# Patient Record
Sex: Female | Born: 1935 | Race: White | Hispanic: No | Marital: Married | Smoking: Never smoker
Health system: Southern US, Community
[De-identification: ages and names within clinical notes are randomized; demographics above are authoritative.]

## PROBLEM LIST (undated history)

## (undated) DIAGNOSIS — R600 Localized edema: Secondary | ICD-10-CM

## (undated) DIAGNOSIS — R0600 Dyspnea, unspecified: Secondary | ICD-10-CM

## (undated) DIAGNOSIS — Z9989 Dependence on other enabling machines and devices: Secondary | ICD-10-CM

## (undated) DIAGNOSIS — M810 Age-related osteoporosis without current pathological fracture: Secondary | ICD-10-CM

## (undated) DIAGNOSIS — I498 Other specified cardiac arrhythmias: Secondary | ICD-10-CM

## (undated) DIAGNOSIS — N183 Chronic kidney disease, stage 3 unspecified: Secondary | ICD-10-CM

## (undated) DIAGNOSIS — E119 Type 2 diabetes mellitus without complications: Secondary | ICD-10-CM

## (undated) DIAGNOSIS — Z950 Presence of cardiac pacemaker: Secondary | ICD-10-CM

## (undated) DIAGNOSIS — Z87440 Personal history of urinary (tract) infections: Secondary | ICD-10-CM

## (undated) DIAGNOSIS — I4819 Other persistent atrial fibrillation: Secondary | ICD-10-CM

## (undated) DIAGNOSIS — Z972 Presence of dental prosthetic device (complete) (partial): Secondary | ICD-10-CM

## (undated) DIAGNOSIS — M75100 Unspecified rotator cuff tear or rupture of unspecified shoulder, not specified as traumatic: Secondary | ICD-10-CM

## (undated) DIAGNOSIS — E785 Hyperlipidemia, unspecified: Secondary | ICD-10-CM

## (undated) DIAGNOSIS — D631 Anemia in chronic kidney disease: Secondary | ICD-10-CM

## (undated) DIAGNOSIS — I5032 Chronic diastolic (congestive) heart failure: Secondary | ICD-10-CM

## (undated) DIAGNOSIS — M1990 Unspecified osteoarthritis, unspecified site: Secondary | ICD-10-CM

## (undated) DIAGNOSIS — F419 Anxiety disorder, unspecified: Secondary | ICD-10-CM

## (undated) DIAGNOSIS — H9191 Unspecified hearing loss, right ear: Secondary | ICD-10-CM

## (undated) DIAGNOSIS — E039 Hypothyroidism, unspecified: Secondary | ICD-10-CM

## (undated) DIAGNOSIS — I10 Essential (primary) hypertension: Secondary | ICD-10-CM

## (undated) HISTORY — DX: Essential (primary) hypertension: I10

## (undated) HISTORY — PX: BACK SURGERY: SHX140

## (undated) HISTORY — DX: Hyperlipidemia, unspecified: E78.5

## (undated) HISTORY — DX: Localized edema: R60.0

## (undated) HISTORY — PX: TUBAL LIGATION: SHX77

## (undated) HISTORY — DX: Other persistent atrial fibrillation: I48.19

## (undated) HISTORY — DX: Chronic diastolic (congestive) heart failure: I50.32

## (undated) HISTORY — DX: Hypothyroidism, unspecified: E03.9

---
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
## 1998-03-15 ENCOUNTER — Ambulatory Visit (HOSPITAL_COMMUNITY): Admission: RE | Admit: 1998-03-15 | Discharge: 1998-03-15 | Payer: Self-pay | Admitting: Gastroenterology

## 1999-10-12 ENCOUNTER — Emergency Department (HOSPITAL_COMMUNITY): Admission: EM | Admit: 1999-10-12 | Discharge: 1999-10-12 | Payer: Self-pay | Admitting: Emergency Medicine

## 1999-10-20 ENCOUNTER — Encounter: Payer: Self-pay | Admitting: Orthopedic Surgery

## 1999-10-20 ENCOUNTER — Ambulatory Visit (HOSPITAL_COMMUNITY): Admission: RE | Admit: 1999-10-20 | Discharge: 1999-10-20 | Payer: Self-pay | Admitting: Orthopedic Surgery

## 1999-11-15 ENCOUNTER — Inpatient Hospital Stay (HOSPITAL_COMMUNITY): Admission: RE | Admit: 1999-11-15 | Discharge: 1999-11-16 | Payer: Self-pay | Admitting: Neurosurgery

## 2000-02-17 ENCOUNTER — Emergency Department (HOSPITAL_COMMUNITY): Admission: EM | Admit: 2000-02-17 | Discharge: 2000-02-18 | Payer: Self-pay | Admitting: Emergency Medicine

## 2000-02-18 ENCOUNTER — Encounter: Payer: Self-pay | Admitting: Emergency Medicine

## 2000-09-06 ENCOUNTER — Encounter: Payer: Self-pay | Admitting: Orthopedic Surgery

## 2000-09-06 ENCOUNTER — Encounter: Admission: RE | Admit: 2000-09-06 | Discharge: 2000-09-06 | Payer: Self-pay | Admitting: Orthopedic Surgery

## 2000-09-11 ENCOUNTER — Ambulatory Visit (HOSPITAL_COMMUNITY): Admission: RE | Admit: 2000-09-11 | Discharge: 2000-09-11 | Payer: Self-pay | Admitting: Neurosurgery

## 2000-10-09 ENCOUNTER — Inpatient Hospital Stay (HOSPITAL_COMMUNITY): Admission: RE | Admit: 2000-10-09 | Discharge: 2000-10-11 | Payer: Self-pay | Admitting: Neurosurgery

## 2001-03-16 ENCOUNTER — Ambulatory Visit (HOSPITAL_COMMUNITY): Admission: RE | Admit: 2001-03-16 | Discharge: 2001-03-16 | Payer: Self-pay | Admitting: Neurosurgery

## 2004-01-18 ENCOUNTER — Ambulatory Visit (HOSPITAL_COMMUNITY): Admission: RE | Admit: 2004-01-18 | Discharge: 2004-01-18 | Payer: Self-pay | Admitting: Neurosurgery

## 2004-11-23 ENCOUNTER — Inpatient Hospital Stay (HOSPITAL_COMMUNITY): Admission: RE | Admit: 2004-11-23 | Discharge: 2004-11-26 | Payer: Self-pay | Admitting: Neurosurgery

## 2006-01-25 ENCOUNTER — Encounter: Admission: RE | Admit: 2006-01-25 | Discharge: 2006-01-25 | Payer: Self-pay | Admitting: Orthopedic Surgery

## 2006-02-04 ENCOUNTER — Encounter: Admission: RE | Admit: 2006-02-04 | Discharge: 2006-02-04 | Payer: Self-pay | Admitting: Orthopedic Surgery

## 2006-05-10 ENCOUNTER — Ambulatory Visit (HOSPITAL_COMMUNITY): Admission: RE | Admit: 2006-05-10 | Discharge: 2006-05-10 | Payer: Self-pay | Admitting: Orthopedic Surgery

## 2006-05-10 ENCOUNTER — Ambulatory Visit: Payer: Self-pay | Admitting: Vascular Surgery

## 2006-05-12 ENCOUNTER — Encounter: Admission: RE | Admit: 2006-05-12 | Discharge: 2006-05-12 | Payer: Self-pay | Admitting: Orthopedic Surgery

## 2008-02-11 ENCOUNTER — Emergency Department (HOSPITAL_COMMUNITY): Admission: EM | Admit: 2008-02-11 | Discharge: 2008-02-11 | Payer: Self-pay | Admitting: Emergency Medicine

## 2008-07-01 HISTORY — PX: REPLACEMENT TOTAL KNEE: SUR1224

## 2008-07-16 ENCOUNTER — Encounter: Admission: RE | Admit: 2008-07-16 | Discharge: 2008-07-16 | Payer: Self-pay | Admitting: Orthopedic Surgery

## 2008-07-19 ENCOUNTER — Inpatient Hospital Stay (HOSPITAL_COMMUNITY): Admission: RE | Admit: 2008-07-19 | Discharge: 2008-07-27 | Payer: Self-pay | Admitting: Orthopedic Surgery

## 2008-07-19 ENCOUNTER — Ambulatory Visit: Payer: Self-pay | Admitting: Cardiovascular Disease

## 2008-07-23 ENCOUNTER — Ambulatory Visit: Payer: Self-pay | Admitting: Infectious Diseases

## 2008-07-26 ENCOUNTER — Encounter (INDEPENDENT_AMBULATORY_CARE_PROVIDER_SITE_OTHER): Payer: Self-pay | Admitting: Internal Medicine

## 2008-07-27 ENCOUNTER — Encounter: Payer: Self-pay | Admitting: Cardiology

## 2008-08-18 ENCOUNTER — Encounter: Admission: RE | Admit: 2008-08-18 | Discharge: 2008-11-16 | Payer: Self-pay | Admitting: Orthopedic Surgery

## 2008-09-07 ENCOUNTER — Encounter: Admission: RE | Admit: 2008-09-07 | Discharge: 2008-09-07 | Payer: Self-pay | Admitting: Orthopedic Surgery

## 2008-09-07 ENCOUNTER — Telehealth (INDEPENDENT_AMBULATORY_CARE_PROVIDER_SITE_OTHER): Payer: Self-pay | Admitting: Radiology

## 2009-07-14 IMAGING — US US EXTREM LOW VENOUS*R*
1 series · 14 of 24 positions shown · non-contrast
Comparison: None

CLINICAL DATA: Recent right knee replacement surgery.  Right lower
extremity edema and pain.



[Series 1: us extrem low venous*right* · 14 of 30 slices shown]
[im 1/30]
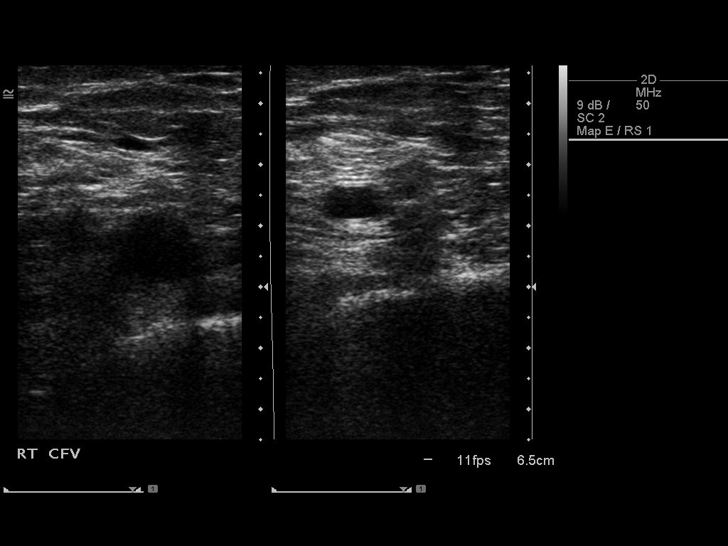
[im 3/30]
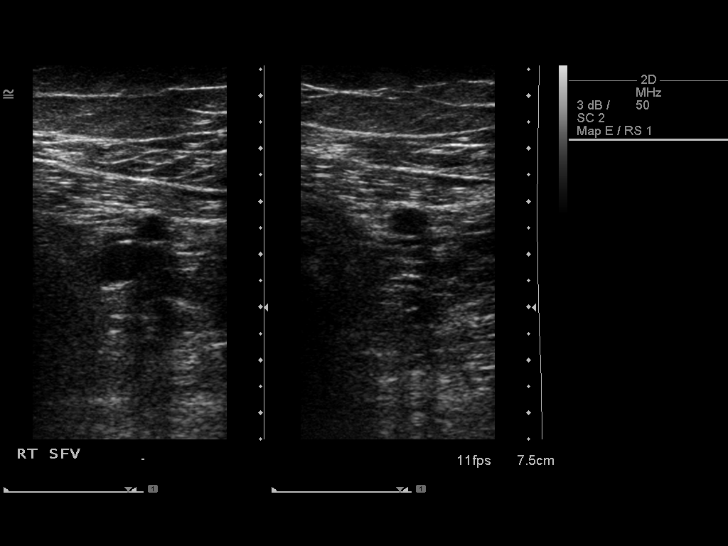
[im 6/30]
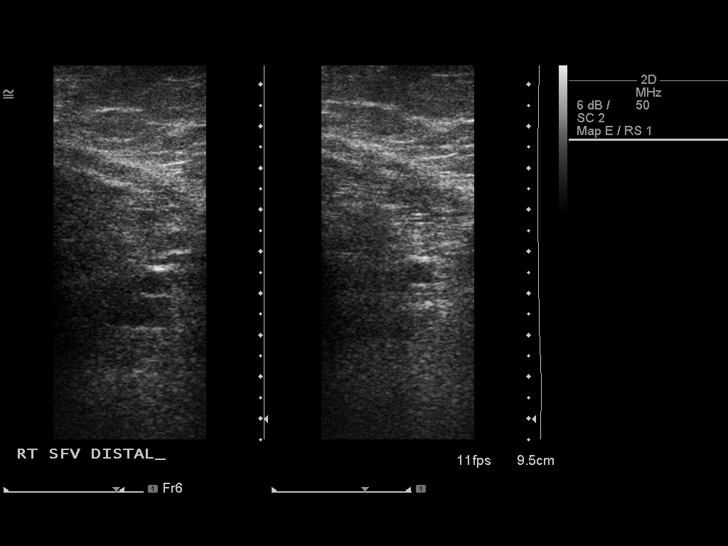
[im 8/30]
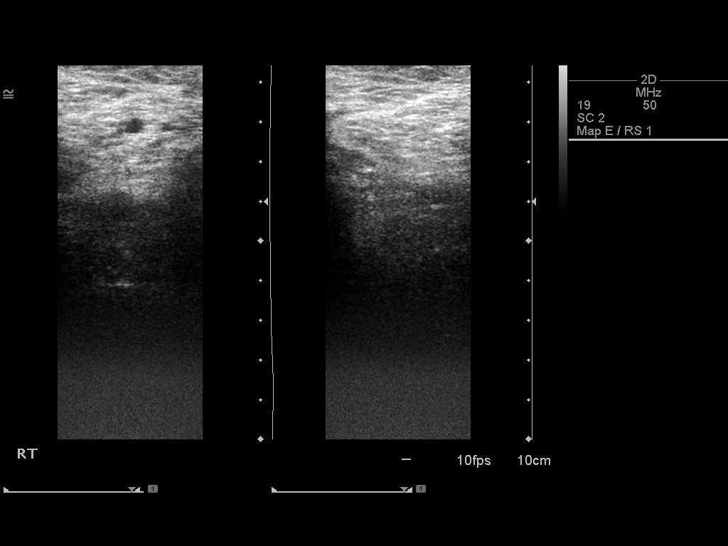
[im 9/30]
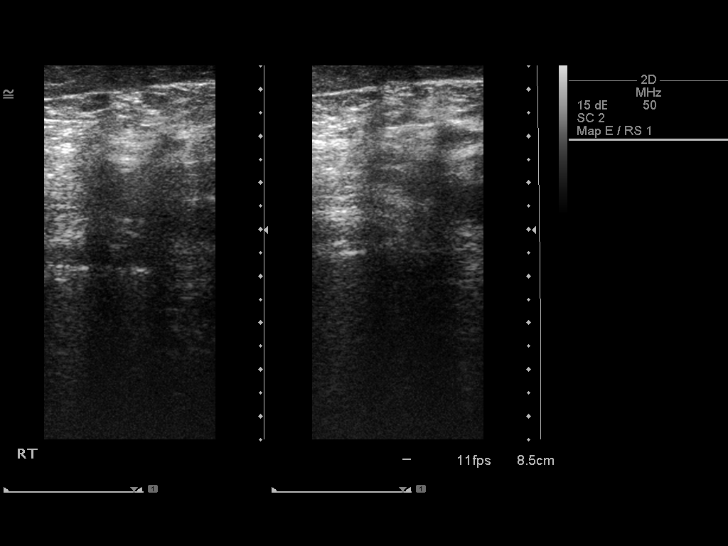
[im 12/30]
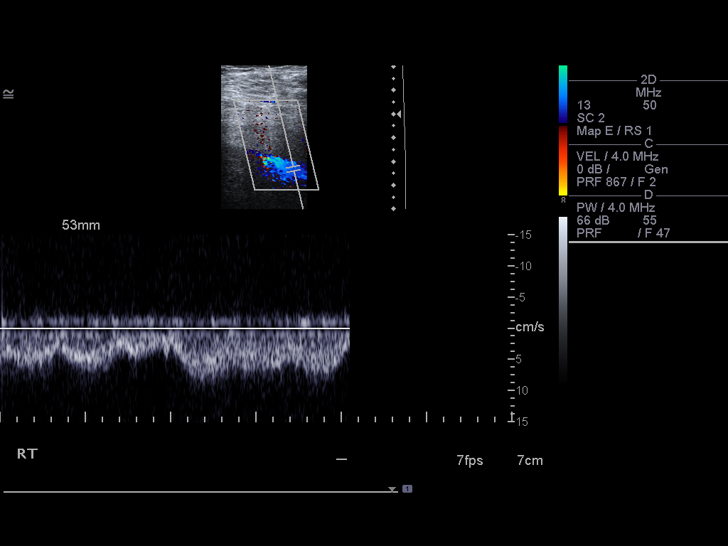
[im 14/30]
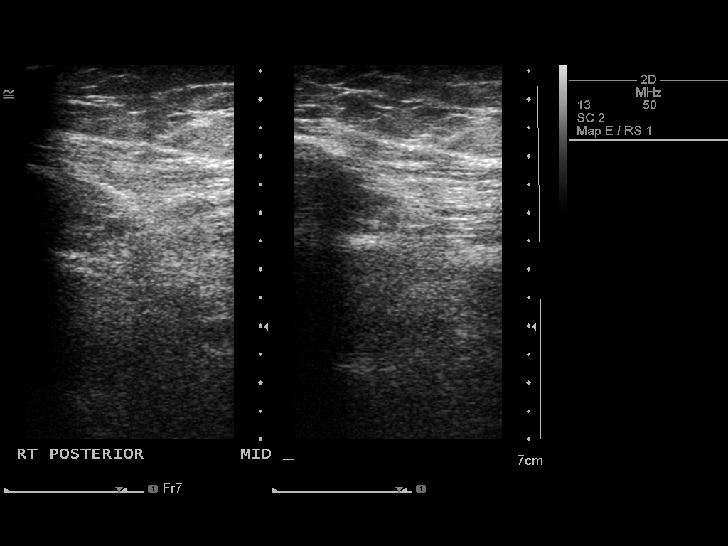
[im 16/30]
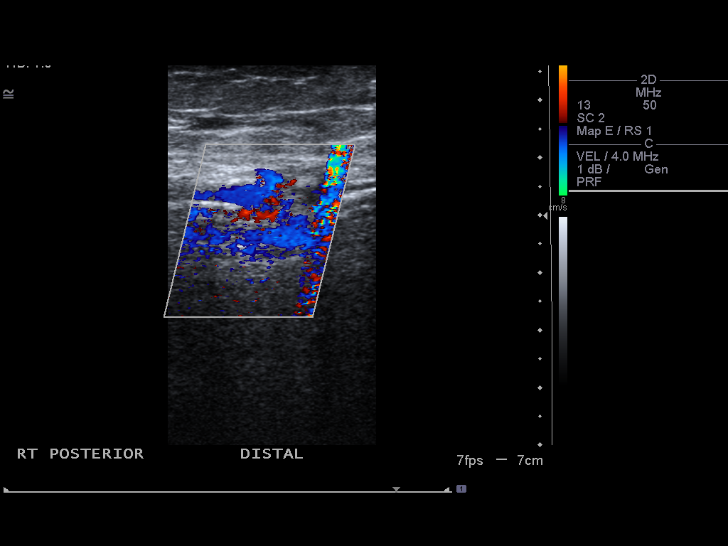
[im 18/30]
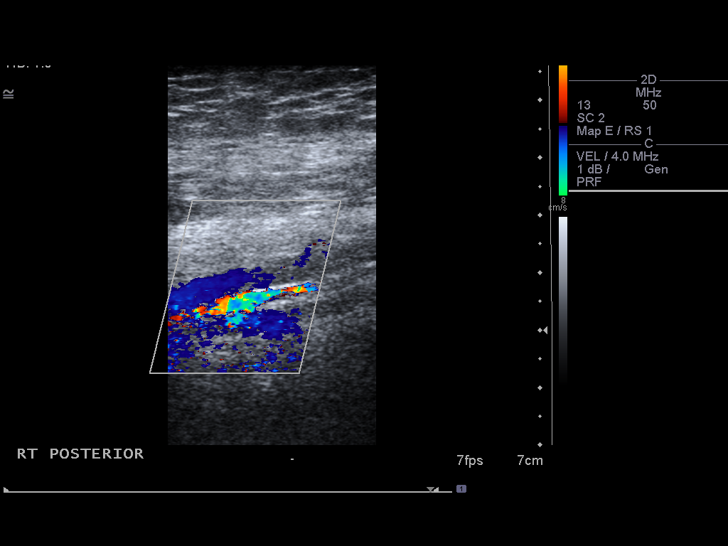
[im 21/30]
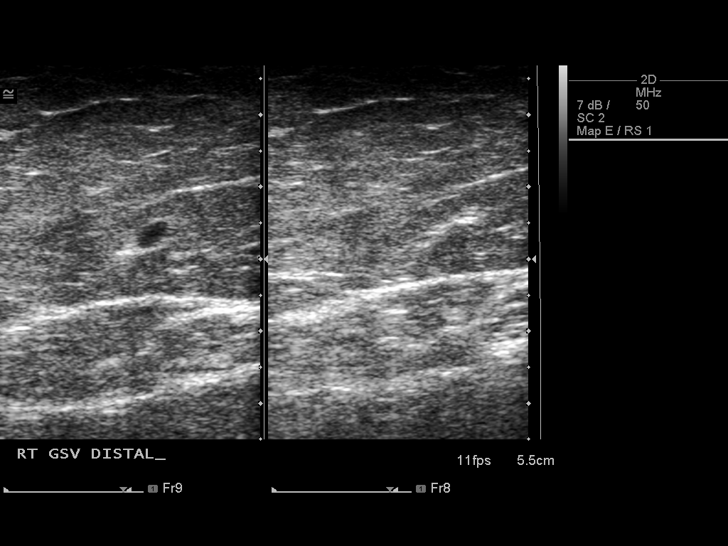
[im 23/30]
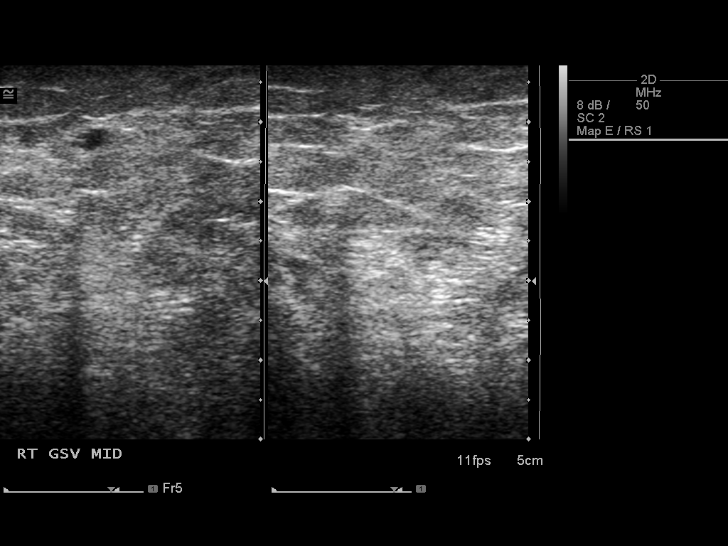
[im 24/30]
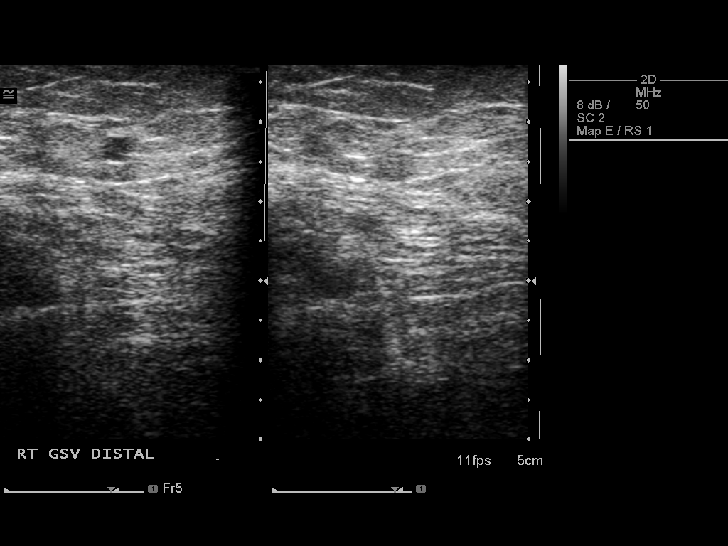
[im 27/30]
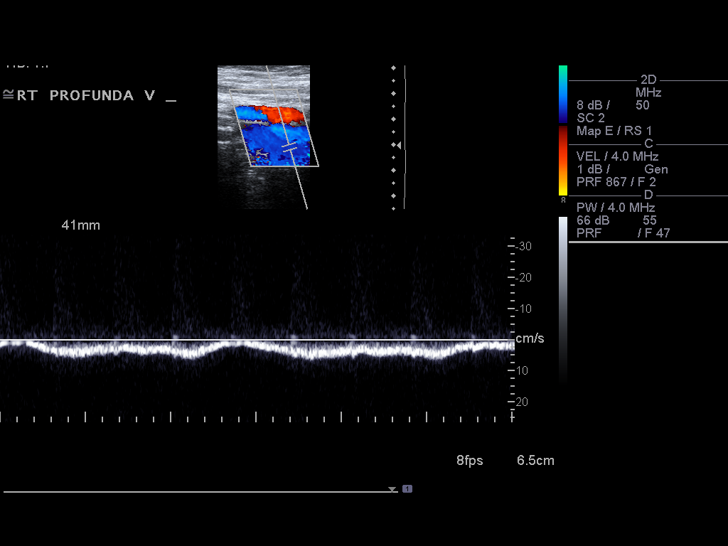
[im 30/30]
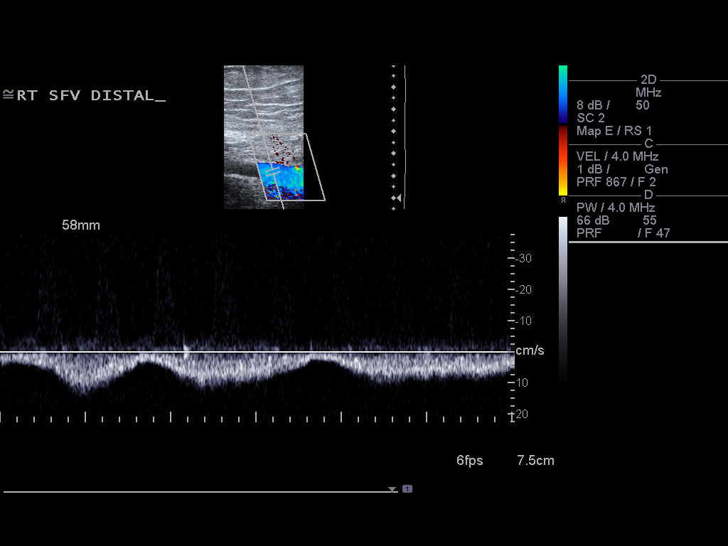

[14 of 24 positions shown; findings below may reference images not displayed]

FINDINGS: Focal nonocclusive thrombus present in the right
popliteal vein.  Other deep veins of the right lower extremity are
normally patent including the visualized tibial veins.  No evidence
of superficial thrombophlebitis.
IMPRESSION: Positive for DVT at the level of the right popliteal vein.  Other
deep veins of the right lower extremity are normally patent.

## 2009-08-16 ENCOUNTER — Encounter: Admission: RE | Admit: 2009-08-16 | Discharge: 2009-08-16 | Payer: Self-pay | Admitting: General Surgery

## 2009-08-18 ENCOUNTER — Ambulatory Visit (HOSPITAL_BASED_OUTPATIENT_CLINIC_OR_DEPARTMENT_OTHER): Admission: RE | Admit: 2009-08-18 | Discharge: 2009-08-18 | Payer: Self-pay | Admitting: General Surgery

## 2010-04-23 ENCOUNTER — Encounter: Payer: Self-pay | Admitting: Orthopedic Surgery

## 2010-05-02 NOTE — Progress Notes (Signed)
Summary: Nuc Pre-Procedure  Phone Note Outgoing Call Call back at Home Phone [phone]   Call placed by: Vedia Pereyra, CNMT at 2:30PM Summary of Call: Reviewed information on Myoview Information Sheet (see scanned document for further details).  Spoke with patient.       Nuclear Med Background Indications for Stress Test: Evaluation for Ischemia   History: Echo  History Comments: 07/19/08- Hosp. for TKR and 2 episodes of PAF noted Post-Op. 07/26/08- Echo- EF= 65-70%  Symptoms: DOE, Palpitations  Symptoms Comments: Chronic DOE   Nuclear Pre-Procedure Cardiac Risk Factors: Family History - CAD, Hypertension, Lipids, NIDDM  Appended Document: Nuc Pre-Procedure Patient no show for myoview 09/08/08,left message for patient to call us back to reschedule. The patient called back and cancelled due to diagnosed with a blood clot per Eliezer Lofts. Dr. Vonda Antigua nurse notified.

## 2010-06-19 LAB — DIFFERENTIAL
Basophils Absolute: 0 10*3/uL (ref 0.0–0.1)
Basophils Relative: 0 % (ref 0–1)
Eosinophils Absolute: 0.1 10*3/uL (ref 0.0–0.7)
Eosinophils Relative: 1 % (ref 0–5)
Lymphocytes Relative: 27 % (ref 12–46)
Lymphs Abs: 2.2 10*3/uL (ref 0.7–4.0)
Monocytes Absolute: 0.7 10*3/uL (ref 0.1–1.0)
Monocytes Relative: 9 % (ref 3–12)
Neutro Abs: 5.1 10*3/uL (ref 1.7–7.7)
Neutrophils Relative %: 63 % (ref 43–77)

## 2010-06-19 LAB — GLUCOSE, CAPILLARY
Glucose-Capillary: 168 mg/dL — ABNORMAL HIGH (ref 70–99)
Glucose-Capillary: 198 mg/dL — ABNORMAL HIGH (ref 70–99)

## 2010-06-19 LAB — BASIC METABOLIC PANEL
BUN: 18 mg/dL (ref 6–23)
CO2: 27 mEq/L (ref 19–32)
Calcium: 9.5 mg/dL (ref 8.4–10.5)
Chloride: 103 mEq/L (ref 96–112)
Creatinine, Ser: 0.63 mg/dL (ref 0.4–1.2)
GFR calc Af Amer: >60 mL/min (ref >60)
GFR calc non Af Amer: >60 mL/min (ref >60)
Glucose, Bld: 147 mg/dL — ABNORMAL HIGH (ref 70–99)
Potassium: 4.2 mEq/L (ref 3.5–5.1)
Sodium: 138 mEq/L (ref 135–145)

## 2010-06-19 LAB — CBC
HCT: 36.5 % (ref 36.0–46.0)
Hemoglobin: 12.1 g/dL (ref 12.0–15.0)
MCHC: 33.3 g/dL (ref 30.0–36.0)
MCV: 81.9 fL (ref 78.0–100.0)
Platelets: 194 10*3/uL (ref 150–400)
RBC: 4.46 MIL/uL (ref 3.87–5.11)
RDW: 15.9 % — ABNORMAL HIGH (ref 11.5–15.5)
WBC: 8.1 10*3/uL (ref 4.0–10.5)

## 2010-07-12 LAB — CARDIAC PANEL(CRET KIN+CKTOT+MB+TROPI)
CK, MB: 6.4 ng/mL — ABNORMAL HIGH (ref 0.3–4.0)
CK, MB: 7 ng/mL — ABNORMAL HIGH (ref 0.3–4.0)
CK, MB: 7.2 ng/mL — ABNORMAL HIGH (ref 0.3–4.0)
Relative Index: 1.6 (ref 0.0–2.5)
Relative Index: 2.1 (ref 0.0–2.5)
Relative Index: 2.1 (ref 0.0–2.5)
Total CK: 339 U/L — ABNORMAL HIGH (ref 7–177)
Total CK: 340 U/L — ABNORMAL HIGH (ref 7–177)
Total CK: 389 U/L — ABNORMAL HIGH (ref 7–177)
Troponin I: 0.01 ng/mL (ref 0.00–0.06)
Troponin I: 0.08 ng/mL — ABNORMAL HIGH (ref 0.00–0.06)
Troponin I: 0.09 ng/mL — ABNORMAL HIGH (ref 0.00–0.06)

## 2010-07-12 LAB — COMPREHENSIVE METABOLIC PANEL
ALT: 21 U/L (ref 0–35)
ALT: 29 U/L (ref 0–35)
AST: 31 U/L (ref 0–37)
AST: 40 U/L — ABNORMAL HIGH (ref 0–37)
Albumin: 2.5 g/dL — ABNORMAL LOW (ref 3.5–5.2)
Albumin: 4.1 g/dL (ref 3.5–5.2)
Alkaline Phosphatase: 41 U/L (ref 39–117)
Alkaline Phosphatase: 69 U/L (ref 39–117)
BUN: 10 mg/dL (ref 6–23)
BUN: 13 mg/dL (ref 6–23)
CO2: 25 mEq/L (ref 19–32)
CO2: 26 mEq/L (ref 19–32)
Calcium: 8.4 mg/dL (ref 8.4–10.5)
Calcium: 10.1 mg/dL (ref 8.4–10.5)
Chloride: 105 mEq/L (ref 96–112)
Chloride: 102 mEq/L (ref 96–112)
Creatinine, Ser: 0.63 mg/dL (ref 0.4–1.2)
Creatinine, Ser: 0.7 mg/dL (ref 0.4–1.2)
GFR calc Af Amer: >60 mL/min (ref >60)
GFR calc Af Amer: >60 mL/min (ref >60)
GFR calc non Af Amer: >60 mL/min (ref >60)
GFR calc non Af Amer: >60 mL/min (ref >60)
Glucose, Bld: 69 mg/dL — ABNORMAL LOW (ref 70–99)
Glucose, Bld: 93 mg/dL (ref 70–99)
Potassium: 3.7 mEq/L (ref 3.5–5.1)
Potassium: 4.5 mEq/L (ref 3.5–5.1)
Sodium: 138 mEq/L (ref 135–145)
Sodium: 139 mEq/L (ref 135–145)
Total Bilirubin: 0.4 mg/dL (ref 0.3–1.2)
Total Bilirubin: 0.6 mg/dL (ref 0.3–1.2)
Total Protein: 5.8 g/dL — ABNORMAL LOW (ref 6.0–8.3)
Total Protein: 7.6 g/dL (ref 6.0–8.3)

## 2010-07-12 LAB — GLUCOSE, CAPILLARY
Glucose-Capillary: 100 mg/dL — ABNORMAL HIGH (ref 70–99)
Glucose-Capillary: 107 mg/dL — ABNORMAL HIGH (ref 70–99)
Glucose-Capillary: 116 mg/dL — ABNORMAL HIGH (ref 70–99)
Glucose-Capillary: 116 mg/dL — ABNORMAL HIGH (ref 70–99)
Glucose-Capillary: 118 mg/dL — ABNORMAL HIGH (ref 70–99)
Glucose-Capillary: 121 mg/dL — ABNORMAL HIGH (ref 70–99)
Glucose-Capillary: 125 mg/dL — ABNORMAL HIGH (ref 70–99)
Glucose-Capillary: 125 mg/dL — ABNORMAL HIGH (ref 70–99)
Glucose-Capillary: 127 mg/dL — ABNORMAL HIGH (ref 70–99)
Glucose-Capillary: 128 mg/dL — ABNORMAL HIGH (ref 70–99)
Glucose-Capillary: 136 mg/dL — ABNORMAL HIGH (ref 70–99)
Glucose-Capillary: 137 mg/dL — ABNORMAL HIGH (ref 70–99)
Glucose-Capillary: 139 mg/dL — ABNORMAL HIGH (ref 70–99)
Glucose-Capillary: 143 mg/dL — ABNORMAL HIGH (ref 70–99)
Glucose-Capillary: 144 mg/dL — ABNORMAL HIGH (ref 70–99)
Glucose-Capillary: 145 mg/dL — ABNORMAL HIGH (ref 70–99)
Glucose-Capillary: 156 mg/dL — ABNORMAL HIGH (ref 70–99)
Glucose-Capillary: 156 mg/dL — ABNORMAL HIGH (ref 70–99)
Glucose-Capillary: 157 mg/dL — ABNORMAL HIGH (ref 70–99)
Glucose-Capillary: 160 mg/dL — ABNORMAL HIGH (ref 70–99)
Glucose-Capillary: 164 mg/dL — ABNORMAL HIGH (ref 70–99)
Glucose-Capillary: 166 mg/dL — ABNORMAL HIGH (ref 70–99)
Glucose-Capillary: 169 mg/dL — ABNORMAL HIGH (ref 70–99)
Glucose-Capillary: 169 mg/dL — ABNORMAL HIGH (ref 70–99)
Glucose-Capillary: 170 mg/dL — ABNORMAL HIGH (ref 70–99)
Glucose-Capillary: 172 mg/dL — ABNORMAL HIGH (ref 70–99)
Glucose-Capillary: 184 mg/dL — ABNORMAL HIGH (ref 70–99)
Glucose-Capillary: 187 mg/dL — ABNORMAL HIGH (ref 70–99)
Glucose-Capillary: 188 mg/dL — ABNORMAL HIGH (ref 70–99)
Glucose-Capillary: 203 mg/dL — ABNORMAL HIGH (ref 70–99)
Glucose-Capillary: 204 mg/dL — ABNORMAL HIGH (ref 70–99)
Glucose-Capillary: 77 mg/dL (ref 70–99)
Glucose-Capillary: 79 mg/dL (ref 70–99)
Glucose-Capillary: 83 mg/dL (ref 70–99)
Glucose-Capillary: 92 mg/dL (ref 70–99)
Glucose-Capillary: 93 mg/dL (ref 70–99)

## 2010-07-12 LAB — CBC
HCT: 25 % — ABNORMAL LOW (ref 36.0–46.0)
HCT: 29 % — ABNORMAL LOW (ref 36.0–46.0)
HCT: 29.4 % — ABNORMAL LOW (ref 36.0–46.0)
HCT: 30 % — ABNORMAL LOW (ref 36.0–46.0)
HCT: 30.8 % — ABNORMAL LOW (ref 36.0–46.0)
HCT: 32.5 % — ABNORMAL LOW (ref 36.0–46.0)
HCT: 33.1 % — ABNORMAL LOW (ref 36.0–46.0)
HCT: 33.6 % — ABNORMAL LOW (ref 36.0–46.0)
HCT: 34 % — ABNORMAL LOW (ref 36.0–46.0)
HCT: 36.8 % (ref 36.0–46.0)
Hemoglobin: 10.1 g/dL — ABNORMAL LOW (ref 12.0–15.0)
Hemoglobin: 10.2 g/dL — ABNORMAL LOW (ref 12.0–15.0)
Hemoglobin: 10.5 g/dL — ABNORMAL LOW (ref 12.0–15.0)
Hemoglobin: 10.8 g/dL — ABNORMAL LOW (ref 12.0–15.0)
Hemoglobin: 11.1 g/dL — ABNORMAL LOW (ref 12.0–15.0)
Hemoglobin: 11.2 g/dL — ABNORMAL LOW (ref 12.0–15.0)
Hemoglobin: 11.4 g/dL — ABNORMAL LOW (ref 12.0–15.0)
Hemoglobin: 8.5 g/dL — ABNORMAL LOW (ref 12.0–15.0)
Hemoglobin: 9.8 g/dL — ABNORMAL LOW (ref 12.0–15.0)
Hemoglobin: 12.3 g/dL (ref 12.0–15.0)
MCHC: 33.3 g/dL (ref 30.0–36.0)
MCHC: 33.4 g/dL (ref 30.0–36.0)
MCHC: 33.4 g/dL (ref 30.0–36.0)
MCHC: 33.7 g/dL (ref 30.0–36.0)
MCHC: 33.7 g/dL (ref 30.0–36.0)
MCHC: 33.8 g/dL (ref 30.0–36.0)
MCHC: 33.9 g/dL (ref 30.0–36.0)
MCHC: 34 g/dL (ref 30.0–36.0)
MCHC: 34.8 g/dL (ref 30.0–36.0)
MCHC: 33.4 g/dL (ref 30.0–36.0)
MCV: 79.9 fL (ref 78.0–100.0)
MCV: 80.3 fL (ref 78.0–100.0)
MCV: 80.3 fL (ref 78.0–100.0)
MCV: 81 fL (ref 78.0–100.0)
MCV: 81.2 fL (ref 78.0–100.0)
MCV: 81.2 fL (ref 78.0–100.0)
MCV: 81.5 fL (ref 78.0–100.0)
MCV: 82 fL (ref 78.0–100.0)
MCV: 82.2 fL (ref 78.0–100.0)
MCV: 79.9 fL (ref 78.0–100.0)
Platelets: 124 10*3/uL — ABNORMAL LOW (ref 150–400)
Platelets: 125 10*3/uL — ABNORMAL LOW (ref 150–400)
Platelets: 140 10*3/uL — ABNORMAL LOW (ref 150–400)
Platelets: 182 10*3/uL (ref 150–400)
Platelets: 194 10*3/uL (ref 150–400)
Platelets: 194 10*3/uL (ref 150–400)
Platelets: 196 10*3/uL (ref 150–400)
Platelets: 212 10*3/uL (ref 150–400)
Platelets: 261 10*3/uL (ref 150–400)
Platelets: 234 10*3/uL (ref 150–400)
RBC: 3.12 MIL/uL — ABNORMAL LOW (ref 3.87–5.11)
RBC: 3.63 MIL/uL — ABNORMAL LOW (ref 3.87–5.11)
RBC: 3.66 MIL/uL — ABNORMAL LOW (ref 3.87–5.11)
RBC: 3.68 MIL/uL — ABNORMAL LOW (ref 3.87–5.11)
RBC: 3.79 MIL/uL — ABNORMAL LOW (ref 3.87–5.11)
RBC: 3.96 MIL/uL (ref 3.87–5.11)
RBC: 4.08 MIL/uL (ref 3.87–5.11)
RBC: 4.09 MIL/uL (ref 3.87–5.11)
RBC: 4.2 MIL/uL (ref 3.87–5.11)
RBC: 4.61 MIL/uL (ref 3.87–5.11)
RDW: 15.9 % — ABNORMAL HIGH (ref 11.5–15.5)
RDW: 15.9 % — ABNORMAL HIGH (ref 11.5–15.5)
RDW: 16.2 % — ABNORMAL HIGH (ref 11.5–15.5)
RDW: 16.6 % — ABNORMAL HIGH (ref 11.5–15.5)
RDW: 16.6 % — ABNORMAL HIGH (ref 11.5–15.5)
RDW: 16.6 % — ABNORMAL HIGH (ref 11.5–15.5)
RDW: 16.7 % — ABNORMAL HIGH (ref 11.5–15.5)
RDW: 16.8 % — ABNORMAL HIGH (ref 11.5–15.5)
RDW: 17 % — ABNORMAL HIGH (ref 11.5–15.5)
RDW: 16.3 % — ABNORMAL HIGH (ref 11.5–15.5)
WBC: 10.7 10*3/uL — ABNORMAL HIGH (ref 4.0–10.5)
WBC: 10.8 10*3/uL — ABNORMAL HIGH (ref 4.0–10.5)
WBC: 10.9 10*3/uL — ABNORMAL HIGH (ref 4.0–10.5)
WBC: 10.9 10*3/uL — ABNORMAL HIGH (ref 4.0–10.5)
WBC: 10.9 10*3/uL — ABNORMAL HIGH (ref 4.0–10.5)
WBC: 11.7 10*3/uL — ABNORMAL HIGH (ref 4.0–10.5)
WBC: 8.5 10*3/uL (ref 4.0–10.5)
WBC: 9.2 10*3/uL (ref 4.0–10.5)
WBC: 9.6 10*3/uL (ref 4.0–10.5)
WBC: 10.1 10*3/uL (ref 4.0–10.5)

## 2010-07-12 LAB — BASIC METABOLIC PANEL
BUN: 10 mg/dL (ref 6–23)
BUN: 10 mg/dL (ref 6–23)
BUN: 10 mg/dL (ref 6–23)
BUN: 11 mg/dL (ref 6–23)
BUN: 12 mg/dL (ref 6–23)
BUN: 14 mg/dL (ref 6–23)
BUN: 17 mg/dL (ref 6–23)
BUN: 5 mg/dL — ABNORMAL LOW (ref 6–23)
CO2: 23 mEq/L (ref 19–32)
CO2: 24 mEq/L (ref 19–32)
CO2: 24 mEq/L (ref 19–32)
CO2: 24 mEq/L (ref 19–32)
CO2: 26 mEq/L (ref 19–32)
CO2: 26 mEq/L (ref 19–32)
CO2: 27 mEq/L (ref 19–32)
CO2: 28 mEq/L (ref 19–32)
Calcium: 8.1 mg/dL — ABNORMAL LOW (ref 8.4–10.5)
Calcium: 8.3 mg/dL — ABNORMAL LOW (ref 8.4–10.5)
Calcium: 8.5 mg/dL (ref 8.4–10.5)
Calcium: 8.6 mg/dL (ref 8.4–10.5)
Calcium: 8.7 mg/dL (ref 8.4–10.5)
Calcium: 8.7 mg/dL (ref 8.4–10.5)
Calcium: 8.8 mg/dL (ref 8.4–10.5)
Calcium: 9 mg/dL (ref 8.4–10.5)
Chloride: 100 mEq/L (ref 96–112)
Chloride: 100 mEq/L (ref 96–112)
Chloride: 101 mEq/L (ref 96–112)
Chloride: 101 mEq/L (ref 96–112)
Chloride: 104 mEq/L (ref 96–112)
Chloride: 104 mEq/L (ref 96–112)
Chloride: 108 mEq/L (ref 96–112)
Chloride: 99 mEq/L (ref 96–112)
Creatinine, Ser: 0.57 mg/dL (ref 0.4–1.2)
Creatinine, Ser: 0.65 mg/dL (ref 0.4–1.2)
Creatinine, Ser: 0.66 mg/dL (ref 0.4–1.2)
Creatinine, Ser: 0.68 mg/dL (ref 0.4–1.2)
Creatinine, Ser: 0.69 mg/dL (ref 0.4–1.2)
Creatinine, Ser: 0.7 mg/dL (ref 0.4–1.2)
Creatinine, Ser: 0.77 mg/dL (ref 0.4–1.2)
Creatinine, Ser: 0.82 mg/dL (ref 0.4–1.2)
GFR calc Af Amer: >60 mL/min (ref >60)
GFR calc Af Amer: >60 mL/min (ref >60)
GFR calc Af Amer: >60 mL/min (ref >60)
GFR calc Af Amer: >60 mL/min (ref >60)
GFR calc Af Amer: >60 mL/min (ref >60)
GFR calc Af Amer: >60 mL/min (ref >60)
GFR calc Af Amer: >60 mL/min (ref >60)
GFR calc Af Amer: >60 mL/min (ref >60)
GFR calc non Af Amer: >60 mL/min (ref >60)
GFR calc non Af Amer: >60 mL/min (ref >60)
GFR calc non Af Amer: >60 mL/min (ref >60)
GFR calc non Af Amer: >60 mL/min (ref >60)
GFR calc non Af Amer: >60 mL/min (ref >60)
GFR calc non Af Amer: >60 mL/min (ref >60)
GFR calc non Af Amer: >60 mL/min (ref >60)
GFR calc non Af Amer: >60 mL/min (ref >60)
Glucose, Bld: 104 mg/dL — ABNORMAL HIGH (ref 70–99)
Glucose, Bld: 126 mg/dL — ABNORMAL HIGH (ref 70–99)
Glucose, Bld: 142 mg/dL — ABNORMAL HIGH (ref 70–99)
Glucose, Bld: 181 mg/dL — ABNORMAL HIGH (ref 70–99)
Glucose, Bld: 192 mg/dL — ABNORMAL HIGH (ref 70–99)
Glucose, Bld: 202 mg/dL — ABNORMAL HIGH (ref 70–99)
Glucose, Bld: 97 mg/dL (ref 70–99)
Glucose, Bld: 99 mg/dL (ref 70–99)
Potassium: 3.4 mEq/L — ABNORMAL LOW (ref 3.5–5.1)
Potassium: 3.6 mEq/L (ref 3.5–5.1)
Potassium: 3.7 mEq/L (ref 3.5–5.1)
Potassium: 3.8 mEq/L (ref 3.5–5.1)
Potassium: 4.1 mEq/L (ref 3.5–5.1)
Potassium: 4.2 mEq/L (ref 3.5–5.1)
Potassium: 4.2 mEq/L (ref 3.5–5.1)
Potassium: 4.7 mEq/L (ref 3.5–5.1)
Sodium: 132 mEq/L — ABNORMAL LOW (ref 135–145)
Sodium: 133 mEq/L — ABNORMAL LOW (ref 135–145)
Sodium: 134 mEq/L — ABNORMAL LOW (ref 135–145)
Sodium: 134 mEq/L — ABNORMAL LOW (ref 135–145)
Sodium: 134 mEq/L — ABNORMAL LOW (ref 135–145)
Sodium: 136 mEq/L (ref 135–145)
Sodium: 136 mEq/L (ref 135–145)
Sodium: 141 mEq/L (ref 135–145)

## 2010-07-12 LAB — URINE CULTURE
Colony Count: >=100000
Colony Count: >=100000

## 2010-07-12 LAB — PROTIME-INR
INR: 1.3 (ref 0.00–1.49)
INR: 1.5 (ref 0.00–1.49)
INR: 1.6 — ABNORMAL HIGH (ref 0.00–1.49)
INR: 1.7 — ABNORMAL HIGH (ref 0.00–1.49)
INR: 1.9 — ABNORMAL HIGH (ref 0.00–1.49)
INR: 1.9 — ABNORMAL HIGH (ref 0.00–1.49)
INR: 2 — ABNORMAL HIGH (ref 0.00–1.49)
INR: 2.4 — ABNORMAL HIGH (ref 0.00–1.49)
INR: 1 (ref 0.00–1.49)
Prothrombin Time: 16.6 seconds — ABNORMAL HIGH (ref 11.6–15.2)
Prothrombin Time: 19.2 seconds — ABNORMAL HIGH (ref 11.6–15.2)
Prothrombin Time: 20 seconds — ABNORMAL HIGH (ref 11.6–15.2)
Prothrombin Time: 21.3 seconds — ABNORMAL HIGH (ref 11.6–15.2)
Prothrombin Time: 22.4 seconds — ABNORMAL HIGH (ref 11.6–15.2)
Prothrombin Time: 22.7 seconds — ABNORMAL HIGH (ref 11.6–15.2)
Prothrombin Time: 23.9 seconds — ABNORMAL HIGH (ref 11.6–15.2)
Prothrombin Time: 28 seconds — ABNORMAL HIGH (ref 11.6–15.2)
Prothrombin Time: 13.4 seconds (ref 11.6–15.2)

## 2010-07-12 LAB — URINALYSIS, ROUTINE W REFLEX MICROSCOPIC
Bilirubin Urine: NEGATIVE
Glucose, UA: NEGATIVE mg/dL
Glucose, UA: NEGATIVE mg/dL
Hgb urine dipstick: NEGATIVE
Ketones, ur: NEGATIVE mg/dL
Ketones, ur: NEGATIVE mg/dL
Nitrite: NEGATIVE
Nitrite: NEGATIVE
Protein, ur: 30 mg/dL — AB
Protein, ur: NEGATIVE mg/dL
Specific Gravity, Urine: 1.028 (ref 1.005–1.030)
Specific Gravity, Urine: 1.009 (ref 1.005–1.030)
Urobilinogen, UA: 0.2 mg/dL (ref 0.0–1.0)
Urobilinogen, UA: 0.2 mg/dL (ref 0.0–1.0)
pH: 5.5 (ref 5.0–8.0)
pH: 8 (ref 5.0–8.0)

## 2010-07-12 LAB — BLOOD GAS, ARTERIAL
Acid-base deficit: 1.2 mmol/L (ref 0.0–2.0)
Bicarbonate: 22.3 mEq/L (ref 20.0–24.0)
O2 Content: 3 L/min
O2 Saturation: 97.3 %
Patient temperature: 98.6
TCO2: 23.3 mmol/L (ref 0–100)
pCO2 arterial: 32.9 mmHg — ABNORMAL LOW (ref 35.0–45.0)
pH, Arterial: 7.445 — ABNORMAL HIGH (ref 7.350–7.400)
pO2, Arterial: 86.3 mmHg (ref 80.0–100.0)

## 2010-07-12 LAB — DIFFERENTIAL
Basophils Absolute: 0 10*3/uL (ref 0.0–0.1)
Basophils Relative: 1 % (ref 0–1)
Eosinophils Absolute: 0.1 10*3/uL (ref 0.0–0.7)
Eosinophils Relative: 1 % (ref 0–5)
Lymphocytes Relative: 31 % (ref 12–46)
Lymphs Abs: 3.1 10*3/uL (ref 0.7–4.0)
Monocytes Absolute: 1 10*3/uL (ref 0.1–1.0)
Monocytes Relative: 9 % (ref 3–12)
Neutro Abs: 5.9 10*3/uL (ref 1.7–7.7)
Neutrophils Relative %: 58 % (ref 43–77)

## 2010-07-12 LAB — THYROID ANTIBODIES
Thyroglobulin Ab: <30 U/mL (ref 0.0–60.0)
Thyroperoxidase Ab SerPl-aCnc: 46.7 U/mL (ref 0.0–60.0)

## 2010-07-12 LAB — TSH: TSH: 3.095 u[IU]/mL (ref 0.350–4.500)

## 2010-07-12 LAB — URINE MICROSCOPIC-ADD ON

## 2010-07-12 LAB — CORTISOL: Cortisol, Plasma: 9.8 ug/dL

## 2010-07-12 LAB — CROSSMATCH
ABO/RH(D): AB NEG
Antibody Screen: NEGATIVE

## 2010-07-12 LAB — TYPE AND SCREEN
ABO/RH(D): AB NEG
Antibody Screen: NEGATIVE

## 2010-07-12 LAB — APTT: aPTT: 29 seconds (ref 24–37)

## 2010-07-12 LAB — ABO/RH: ABO/RH(D): AB NEG

## 2010-07-12 LAB — HEMOGLOBIN A1C
Hgb A1c MFr Bld: 7.1 % — ABNORMAL HIGH (ref 4.6–6.1)
Mean Plasma Glucose: 157 mg/dL

## 2010-07-12 LAB — MAGNESIUM: Magnesium: 2 mg/dL (ref 1.5–2.5)

## 2010-07-12 LAB — BRAIN NATRIURETIC PEPTIDE: Pro B Natriuretic peptide (BNP): 125 pg/mL — ABNORMAL HIGH (ref 0.0–100.0)

## 2010-07-12 LAB — PHOSPHORUS: Phosphorus: 3 mg/dL (ref 2.3–4.6)

## 2010-08-15 NOTE — Discharge Summary (Signed)
NAME:  Ann Fletcher, Ann Fletcher                ACCOUNT NO.:  [id number]   MEDICAL RECORD NO.:  [id number]          PATIENT TYPE:  INP   LOCATION:  5504                         FACILITY:  [facility]   PHYSICIAN:  Audree Camel. Noemi Chapel, M.D. DATE OF BIRTH:  09/30/1932   DATE OF ADMISSION:  07/19/2008  DATE OF DISCHARGE:  07/27/2008                               DISCHARGE SUMMARY   ADMITTING DIAGNOSES:  1. End-stage degenerative joint disease right knee.  2. Diabetes.  3. Anxiety.  4. Thyroid disease.  5. High cholesterol.  6. Hypertension.  7. Klebsiella pneumoniae urinary tract infection.   DISCHARGE DIAGNOSES:  1. Right knee end-stage degenerative joint disease, status post total      knee replacement.  2. Multidrug resistant Escherichia coli urinary tract infection.  3. Diabetes.  4. Anxiety with paranoid delusions.  5. Hypothyroidism.  6. High cholesterol.  7. Hypertension.  8. Paroxysmal atrial fibrillation.  9. Postoperative blood loss anemia.   HISTORY OF PRESENT ILLNESS:  The patient is a 77 year old white female  with a history of end-stage DJD of her right knee.  She has failed  conservative care including anti-inflammatories and intraarticular  cortisone injections.  The risks, benefits and possible complications of  a total knee replacement have been discussed with the patient and her  husband.  They are without question.   PROCEDURES IN-HOUSE:  On July 20, 2007, the patient underwent a right  total knee replacement by Dr. Noemi Chapel and a right femoral nerve block by  Anesthesia and Autovac transfusion.  She tolerated all of these  procedures well.   HOSPITAL COURSE:  Preoperatively, the patient was noted to have a  Klebsiella UTI.  On July 14, 2008, the patient was started on Cipro 500  mg 1 p.o. b.i.d.  She had 10 doses of this prior to admission.  One hour  preop prior to total knee, she was given 1 gram of IV Ancef and this was  continued q.6 for three doses postop.  Despite  these antibiotics, she  continued to run a significant fever and had a white cell count of 10.7.  Postop day #1, her pulse was 102, hemoglobin was low at 9.8, INR was  subtherapeutic at 1.3.  She had no excess drainage on her dressing and  she tolerated the CPM 0-90 degrees.  She was given a 500 mL normal  saline bolus and her IV was saline locked.  Her Foley was discontinued.  Her dressing was changed.  She was given Milk of Magnesia.  Postop day  #2, social worker met with husband and caregiver to discuss DC plan.  Bed search was initiated for skilled nursing facility.  On postop day  #2, hemoglobin was 8.5, T-max was 101.7, white cell count was 10.8.  She  tolerated her CPM.  She was given 2 units of packed red blood cells with  40 mg of Lasix IV between units.  UA was repeated due to her continued  fever with a chest x-ray.  Chest x-ray came back negative.  UA came back  with multidrug resistant E-coli.  The patient  is progressing well with  physical therapy.  The white cell count continued to rise.  Her white  cell count on postop day #3 was 11.7 with a T-max of 101.8.  Her  hemoglobin was 10.2 after transfusion.  On postop day #4, the patient's  wound was well-approximated, but she had some new moderate redness.  She  was started on IV Rocephin due to the new sensitivities of her E. coli.  She remained in the hospital due to her continued fever and increasing  white cell count.  Postop day #4 in the evening she had a run of A fib  with a heart rate in the 200s.  Rapid response was called.  She was  transferred to the coronary care unit.  Medicine was consulted.  The  patient was completely alert and oriented throughout the whole episode.  The patient's was converted with IV Cardizem back to normal sinus  rhythm, but did have some mental status changes of increased confusion  and paranoia after her transfer to coronary care.  Postop day #6, the  patient has been on IV vancomycin and  IV Rocephin.  She continues to be  in normal sinus rhythm.  Her INR is therapeutic at 2.4.  She is  progressing well with physical therapy.  She was transferred from  coronary care to a telemetry unit.  Postop day #7 in the morning the  patient was doing well.  She did have an episode of tachycardia with a  rate in the 140s with a 1.4-second pause.  T max was 99.1.  An echo was  done and the echo showed some mild mitral valve regurg, but no  significant abnormality.  Postop day #8, surgical wound is well-  approximated.  The erythema about her knee does appear to be a macular  rash rather than infectious erythema.  The surgical wound has no  specific redness about it.  The rash is more on the medial and lateral  sides of the knee than directly over the anterior aspect of the knee.  Her UTI is multidrug resistant, but infectious disease feels she will do  well on p.o. Vantin for discharge, so we will do this for discharge.  We  are calling a cardiology consult from [facility] who read her  echo to have them involved so she has an outpatient transition to a  cardiologist.  She will need to remain on Coumadin per their direction  because of her run of atrial fib.  With regards to her psych, we feel  that it is better for her to return home where she has less anxiety and  is more aware of her surroundings than going to a different facility  that may increase her paranoid delusions.  Her hypertension is stable.  Her hypothyroidism is stable.  Her postop blood loss anemia is stable.  She will need home health physical therapy, home health occupational  therapy and nursing for discharge to home.  She will need a walker with  wheels and a 3:1 elevated commode.  She will need to follow up with Dr.  Noemi Chapel on Aug 02, 2008 or Aug 03, 2008, for a suture removal and x-rays.  She will need cardiac follow up within 2 weeks of discharge at [facility].   DISCHARGE MEDICATIONS:  1.  Coumadin per pharmacy protocol.  2. Vantin 200 mg 1 p.o. b.i.d. x14 days.  3. She is taking Tylenol for knee pain.  4. Actos 30 mg  daily.  5. Lorazepam 1 mg t.i.d.  6. Levothroid 100 mcg daily.  7. Glimepiride 4 mg b.i.d.  8. Simvastatin 40 mg daily.  9. Magnesium daily.  10.Metoprolol XL 25 mg daily.  11.Xopenex 2 puffs four times a day.  12.Os-Cal 500 mg twice a day.  13.Protonix 40 mg daily.  14.Ativan.   She is on a diabetic diet.  She is weightbearing as tolerated.  She is  being discharged to home in stable condition.      Kirstin Shepperson, P.A.      Robert A. Noemi Chapel, M.D.  Electronically Signed    KS/MEDQ  D:  07/27/2008  T:  07/27/2008  Job:  [id number]

## 2010-08-15 NOTE — Op Note (Signed)
NAME:  HADELYN, PRETE                ACCOUNT NO.:  [id number]   MEDICAL RECORD NO.:  [id number]          PATIENT TYPE:  INP   LOCATION:  5014                         FACILITY:  [facility]   PHYSICIAN:  Audree Camel. Noemi Chapel, M.D. DATE OF BIRTH:  [birth year]   DATE OF PROCEDURE:  07/19/2008  DATE OF DISCHARGE:                               OPERATIVE REPORT   PREOPERATIVE DIAGNOSIS:  Right knee degenerative joint disease.   POSTOPERATIVE DIAGNOSIS:  Right knee degenerative joint disease.   PROCEDURES:  Right total knee replacement using DePuy cemented total  knee system with #3 revision MTB, tibial cemented tray with #3 cemented  femoral component with 10-mm polyethylene RP tibial spacer, and 32-mm  polyethelene cemented patella.   SURGEON:  Audree Camel. Noemi Chapel, MD   ASSISTANT:  Matthew Saras, PA   ANESTHESIA:  General.   OPERATIVE TIME:  One hour and 20 minutes.   COMPLICATIONS:  None.   DESCRIPTION OF PROCEDURE:  Ms. Marroquin was brought to the operating room  on July 19, 2008, after a femoral nerve block was placed in the holding  area by Anesthesia.  She was placed on the operating table in supine  position.  She received Ancef 1 g IV preoperatively for prophylaxis.  After being placed under general anesthesia, she had a Foley catheter  placed under sterile conditions.  Her right knee was examined.  Range of  motion from -5 to 120 degrees with mild valgus deformity, knee stable,  ligamentous exam with normal patellar tracking.  The right leg was  prepped using sterile Hibiclens and draped using sterile technique.  The  leg was exsanguinated and a thigh tourniquet elevated to 365 mm.  Initially, through a 15-cm longitudinal incision based over the patella,  initial exposure was made.  The underlying subcutaneous tissues were  incised along the skin incision.  A median arthrotomy was performed  revealing an excessive amount of normal-appearing joint fluid.  Articular surfaces were  inspected.  She had grade 4 changes laterally,  grade 3 and 4 changes medially and grade 3 and 4 changes in the  patellofemoral joint.  Osteophytes removed from the femoral condyles and  tibial plateau.  Medial and lateral meniscal remnants were removed as  well as the anterior cruciate ligament.  Intramedullary drill was then  drilled up the femoral canal for placement of distal femoral cutting  jig, which was placed in the appropriate amount of rotation and a distal  11-mm cut was made.  The distal femur was then sized.  The #3 was found  to be the appropriate size.  The #3 cutting jig was placed in the  appropriate amount of external rotation and then these cuts were made.  Proximal tibia was then exposed.  The tibial spines were removed with an  oscillating saw.  Intramedullary drill was drilled down the tibial canal  for placement of the proximal tibial cutting jig, which was placed in  the appropriate amount of rotation and a proximal 6-mm cut was made  based off the medial or lower side.  Spacer blocks were then placed in  flexion and extension.  A 10-mm blocks gave excellent balancing,  excellent stability, and excellent correction of her flexion and varus  deformities.  At this point, the #3 tibial base plate trial was placed  on the cut tibial surface with an excellent fit and the keel cut and the  MTB cut was made.  The PCL box cutter was then placed on the distal  femur and these cuts were made.  At this point, the #3 femoral component  trial was placed and #3 MTB tibial component trial was placed with a 10-  mm polyethelene RP tibial spacer was placed on the tibial base plate,  the knee reduced, taken through range of motion from 0 to 125  degrees  with excellent stability and excellent correction of her flexion and  valgus deformities and normal patellar tracking.  A resurfacing 9-mm cut  was then made on the patella, and 3 locking holes placed in a 32-mm  patella trial.  A  32-mm patella trial was placed and again the  patellofemoral tracking was evaluated and found to be normal.  At this  point, it was felt that all the trial components were of excellent size,  fit, and stability.  They were then removed.  The knee was then jet  lavaged, irrigated with 3 L of saline.  The #3 MTB tibial base plate  with cement backing was then placed in the tibial position and hammered  into position with an excellent fit with excess cement being removed  from around the edges.  The #3 femoral component with cement backing was  hammered into position also with an excellent fit with excess cement  being removed from around the edges.  The 10-mm polyethylene RP tibial  spacer was placed on the tibial base plate and the knee reduced, taken  through range of motion from 0 to 125 degrees with excellent stability  and excellent correction of her flexion and valgus deformities.  The 32-  mm polyethylene patella with cement backing was placed in this position  and held there with a clamp.  After the cement hardened, again the  patellofemoral tracking was evaluated and found to be normal.  At this  point, it was felt that all the components were of excellent size, fit,  and stability.  The wound was further irrigated with saline.  The  tourniquet was released.  Hemostasis obtained with cautery.  The  arthrotomy was then closed with #1 Ethibond suture over 2 medium Hemovac  drains.  Subcutaneous tissue was closed with 0 and 2-0 Vicryl,  subcuticular layer closed with 4-0 Monocryl.  Sterile dressings and a  long leg splint applied and then the patient awakened, extubated, and  taken to recovery room in stable condition.  Needle and sponge counts  correct x2 at the end of case.  Neurovascular status normal.  Pulses 2+  and symmetric.      Robert A. Noemi Chapel, M.D.  Electronically Signed     RAW/MEDQ  D:  07/19/2008  T:  07/20/2008  Job:  [id number]

## 2010-08-15 NOTE — Consult Note (Signed)
NAME:  Ann Fletcher, Ann Fletcher NO.:  [id number]   MEDICAL RECORD NO.:  [id number]          PATIENT TYPE:  INP   LOCATION:  2911                         FACILITY:  [facility]   PHYSICIAN:  Jana Hakim, M.D. DATE OF BIRTH:  Oct 31, 1932   DATE OF CONSULTATION:  07/23/2008  DATE OF DISCHARGE:                                 CONSULTATION   REFERRING PHYSICIAN:  Orthopedic service, Dr. Marcelino Scot and Mr. Eddie Dibbles,  physician's assistant.   PRIMARY CARE PHYSICIAN:  [facility], Dr. Alain Honey.   REASON FOR CONSULTATION:  Palpitations and elevated heart rate.   HOSPITAL COURSE:  This is a 77 year old female who was admitted on April  19 to [facility] for an elective right total knee replacement  secondary to right knee degenerative joint disease.  She was admitted to  the service of orthopedic surgeon, Dr. Nicholaus Bloom and on the night of April  23 at about 9:10 p.m. the patient began to have complaints of  palpitations and feeling as if her heart was racing and feeling anxious.  Mr. Eddie Dibbles,  the physician's assistant who is on-call for orthopedic  surgeon, Dr. Marcelino Scot, responded to the call and called in orders and also  requested the consultation for a medical evaluation.  Mr. Eddie Dibbles called in  orders for an EKG, cardiac enzymes, an ABG and is a spiral CT of the  chest.  The EKG was found to have atrial fibrillation with a rapid  ventricular rate in 160s to 180s.  The patient was ordered and started  on and IV Cardizem drip and transferred to the coronary care unit.   On interview of this patient at the bedside, the patient reports feeling  mild shortness of breath.  Denies having chest pain but she states she  feels her heart racing.  She denies having any previous similar episodes  to this.  The patient denies having any previous cardiac history.   PAST MEDICAL HISTORY:  1. Hypertension.  2. Type 2 diabetes mellitus.  3. Hyperlipidemia.  4. Hypothyroidism.  5.  Osteoarthritis.  6. Degenerative joint disease.  7. Glaucoma.  8. History of left trochanteric bursitis.  9. Lumbar disk disease/herniated disk.   PAST SURGICAL HISTORY:  1. History of a right ear surgery.  2. History of bilateral eye surgery for glaucoma.  3. She has also undergone an L4-L5 microdiskectomy and left laminotomy      and foraminotomy in August 2001.  4. Right total knee replacement in July 2002.  5. Repeat L4 laminectomy with bilateral L3 laminotomies in August      2006.   HOME MEDICATIONS:  1. Levoxyl 137 mcg one p.o. daily.  2. Amaryl 4 mg one p.o. daily  3. Baycol 0.8 mg p.o. daily.  4. Avandia 4 mg p.o. daily.  5. Propranolol 20 mg p.o. daily.   ALLERGIES/INTOLERANCES:  1. QUINIDINE causes GI upset.  2. MORPHINE SULFATE causes hallucinations.  3. NSAIDs and COX INHIBITORS cause GI upset and cramping.  4. BETADINE causes skin irritation.   SOCIAL HISTORY:  The patient is married.  She is a  nonsmoker,  nondrinker.   FAMILY HISTORY:  Positive for coronary artery disease in her father who  died at the age of 34.   REVIEW OF SYSTEMS:  Pertinents are mentioned above.  The patient denies  having any chest pain.  She denies having any nausea, vomiting, denies  diarrhea.  Denies having any fevers, chills.   PHYSICAL EXAMINATION:  GENERAL:  This is a morbidly obese, 77 year old,  elderly Caucasian female in mild distress.  VITAL SIGNS:  Currently blood pressure 146/70.  Her heart rate is 160,  and respirations 24, oxygen saturation  96% on 2 L nasal cannula oxygen.  Last temperature was 98.4.  HEENT:  Normocephalic, atraumatic.  Pupils equally round, reactive to  light.  Extraocular movements are intact.  Funduscopic benign.  Nares  are patent bilaterally.  Oropharynx is clear.  NECK:  Supple, full range of motion.  No thyromegaly, adenopathy,  jugular venous distention.  CARDIOVASCULAR:  Tachycardiac, irregular  rhythm.  LUNGS:  Clear to auscultation  bilaterally.  ABDOMEN:  Positive bowel sounds.  Soft, nontender, nondistended, obese.  No hepatosplenomegaly.  EXTREMITIES:  Both lower extremities in TED hose  and the right knee is dressed in a surgical dressing.  NEUROLOGIC:  The patient is alert and oriented x3.  Her speech is clear.  Cranial nerves are intact.  She is able to move all four of her  extremities, but is limited secondary to her pain.   LABORATORY DATA:  Laboratory studies are pending at this time; awaiting  results of the cardiac enzymes ABG, chest x-ray, TSH level and the CT  scan of the chest which is a spiral CT.   ASSESSMENT:  New onset of atrial fibrillation with rapid ventricular  rate.   PLAN:  The patient will be moved to the coronary care unit and she will  be placed on the IV Cardizem drip and her heart rate will be titrated  downward as her blood pressure tolerates.  Further anticoagulant therapy  will be added..  And pending the results of the patient's studies,  further treatments will be rendered.  The patient will be followed and  monitored by the [facility] medical service as well.      Jana Hakim, M.D.  Electronically Signed     HJ/MEDQ  D:  07/23/2008  T:  07/24/2008  Job:  [id number]

## 2010-08-15 NOTE — Consult Note (Signed)
NAME:  Ann Fletcher, Ann Fletcher                ACCOUNT NO.:  [id number]   MEDICAL RECORD NO.:  [id number]          PATIENT TYPE:  INP   LOCATION:  5504                         FACILITY:  [facility]   PHYSICIAN:  Denice Bors. Stanford Breed, MD, FACCDATE OF BIRTH:  [birth year]   DATE OF CONSULTATION:  07/27/2008  DATE OF DISCHARGE:                                 CONSULTATION   PRIMARY CARE PHYSICIAN:  Ann Fletcher at [facility].   PRIMARY CARDIOLOGIST:  New and will be Ann Fletcher.   CHIEF COMPLAINT:  Paroxysmal atrial fibrillation.   HISTORY OF PRESENT ILLNESS:  Ann Fletcher is a 77 year old female with no  history of coronary artery disease.  She was admitted for a total knee  replacement and had two episodes of postoperative paroxysmal atrial  fibrillation.  It resolved spontaneously, and because of Ann knee  replacement, she was already started on Coumadin with a Lovenox bridge.  Cardiology was asked to evaluate Ann for the atrial fibrillation.   Ann Fletcher states that she has never had chest pain.  She was aware of  the elevated heart rate and describes a sudden onset and sudden stop of  palpitations.  She did not get presyncopal with this.  The palpitations  did not cause chest pain.  She has some chronic dyspnea on exertion, and  this was possibly a little increased.  She did feel the palpitations  stop.  She has never had any of these symptoms prior to admission.  She  feels that she is doing well after Ann knee replacement and is anxious  to go home.   PAST MEDICAL HISTORY:  1. Hypertension.  2. Hyperlipidemia.  3. Diabetes.  4. Family history of coronary artery disease.  5. Hypothyroidism.  6. Glaucoma.  7. Osteoarthritis and degenerative joint disease.  8. Possible COPD.   SURGICAL HISTORY:  1. She is status post back surgery x2.  2. Right total knee replacement on July 19, 2008.  3. Right ear surgery.  4. Bilateral eye surgery for glaucoma.  5. Another total knee replacement in  2002.   ALLERGIES:  SHE IS ALLERGIC OR INTOLERANT TO:  1. QUINIDINE.  2. MORPHINE.  3. NSAIDS WITH GI UPSET.  4. BETADINE.   CURRENT MEDICATIONS:  1. Xopenex inhaler q.i.d.  2. Vitamin E daily.  3. Os-Cal t.i.d.  4. Actos 30 mg a day.  5. Lasix 40 mg a day.  6. Synthroid 100 mcg daily.  7. Amaryl 4 mg b.i.d.  8. Aspirin 325 mg daily.  9. Mag ox 400 mg daily.  10.Toprol XL 25 mg a day.  11.Coumadin 5 mg a day.  12.Vantin for the next 2 weeks.   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  She denies alcohol, tobacco or drug abuse.   FAMILY HISTORY:  Ann Fletcher died at age 29 of pneumonia, and Ann Fletcher  died at age 20 of a heart attack.   REVIEW OF SYSTEMS:  She has not had fevers, chills or sweats.  She did  have some dysuria and was diagnosed with a  UTI, which is the reason for  the antibiotics.  She has some chronic anxiety issues.  She has chronic  arthralgias.  She is not currently having any reflux or abdominal  symptoms related to the aspirin.  She has chronic dyspnea on exertion  and some orthopnea.  She has periodic coughing and wheezing.  The  palpitations have been in hospital only.  Full 14-point review of  systems is otherwise negative.   PHYSICAL EXAMINATION:  VITAL SIGNS:  Temperature 98.5, blood pressure  156/74, pulse 90, respiratory rate 20, O2 saturation 95% on room air.  GENERAL:  She is an elderly white female in no acute pain but with some  anxiety.  HEENT:  Normal.  NECK:  There is no lymphadenopathy, thyromegaly, bruit or JVD noted.  CV:  Ann heart is regular in rate and rhythm with an S1-S2, and no  significant murmur, rub or gallop is noted.  Distal pulses are intact in  all four extremities.  LUNGS:  She has some rales in the bases but no wheezes or crackles are  noted.  SKIN:  Ann incision is healing well without drainage.  She has some  areas of ecchymosis from the Lovenox injections.  ABDOMEN:  Soft and nontender  with active bowel sounds.  EXTREMITIES:  There is no cyanosis or clubbing noted.  She only has  trace edema.  MUSCULOSKELETAL:  There is no joint deformity or effusions and no spine  or CVA tenderness.  NEUROLOGIC:  She is alert and oriented with cranial nerves II-XII  grossly intact.   CT ANGIOGRAM OF THE CHEST:  Negative for PE with probable air trapping.   ELECTROCARDIOGRAM:  Sinus rhythm, rate 90, with no acute ischemic  changes and only minimal lateral T-wave changes since August of 2006.   LABORATORY VALUES:  TSH within normal limits at 3.095, hemoglobin 11.4,  hematocrit 34, WBCs 10.9, platelets 261.  Sodium 136, potassium 3.7,  chloride 104, CO2 of 24, BUN 5, creatinine 0.66, glucose 99.  INR 1.9.   2-D ECHOCARDIOGRAM:  EF 65-70% with no critical valvular abnormalities.   IMPRESSION:  Ann Fletcher was seen today by Ann Fletcher.  She is a 77-  year-old female with a past medical history of diabetes, hypertension,  hyperlipidemia, hypothyroidism who had postoperative atrial fibrillation  after knee surgery on July 19, 2008.  She had two episodes with no  associated chest pain, shortness of breath, only palpitations.  She is  currently in sinus rhythm.  The EKG with atrial fibrillation  had  significant ST depression.  She has a history of dyspnea on exertion at  home but no chest pain.  Ann cardiac enzymes are nondiagnostic.  She is  cleared for discharge from a cardiac standpoint.  She should be  continued on Toprol at discharge.  This is most likely postoperative  atrial fibrillation, and therefore it is doubtful she will need Coumadin  long term.  Because of the EKG changes with the rapid atrial  fibrillation, it is appropriate to do an outpatient Myoview in 4-6  weeks, as she also has multiple cardiac risk factors.  She will follow  up with Ann Fletcher after the [facility].  No further medication changes  are recommended.  However, consideration can be given to decreasing  Ann  aspirin to 81 mg a day with Ann history of intolerance to aspirin and a  near therapeutic Coumadin level.      Rosaria Ferries, PA-C      Aaron Edelman  S. Stanford Breed, MD, [facility]  Electronically Signed    RB/MEDQ  D:  07/27/2008  T:  07/27/2008  Job:  [id number]

## 2010-08-15 NOTE — Consult Note (Signed)
NAME:  Ann Fletcher, Ann Fletcher                ACCOUNT NO.:  [id number]   MEDICAL RECORD NO.:  [id number]          PATIENT TYPE:  INP   LOCATION:  2911                         FACILITY:  [facility]   PHYSICIAN:  Jana Hakim, M.D. DATE OF BIRTH:  06/14/1932   DATE OF CONSULTATION:  07/24/2008  DATE OF DISCHARGE:                                 CONSULTATION   ADDENDUM   REQUESTED BY:  Orthopedics.   Laboratory studies returned, and the patient was found to have an  arterial blood gas of 7.445, pCO2 32.9, pO2 86, bicarb 22.3 and O2  saturation of 97.3.  Cardiac enzymes returned with a creatinine kinase  of 389, CK-MB of 6.4, relative index 1.6 and troponin of 0.01.  That was  the first cardiac enzyme.  A CBC had been performed as well, and the  white blood cell count returned at 10.9, hemoglobin 11.2, hematocrit  33.6 and platelets 182.  Chemistry returned with a sodium of 134,  potassium 3.4, chloride 101, bicarb 23, BUN 12, creatinine 0.69 and  glucose 192.  The patient had a PT and INR earlier in the day July 23, 2008.  The PT was 19.2.  INR was 1.5.  She is already on the Coumadin  protocol with full dose Lovenox therapy.  A BNP was ordered.  This  returned at 125, and the spiral CT of the chest has been postponed  secondary to the patient's instability.  The patient had a chest x-ray  which had been performed as well which revealed cardiomegaly with  pulmonary vascular congestion and bronchitic changes.   The patient was initially placed on the IV Cardizem drip, and IV digoxin  loading was started.  However, this was discontinued when the patient  failed to respond with a heart rate which persisted above the 160s.  The  patient was placed on an amiodarone drip instead, and the Cardizem drip  was tapered to off.  The patient did convert to sinus rhythm.  The  patient remains on the full dose Lovenox therapy and the Coumadin  protocol for now and will proceed to have the spiral CT performed  of the  chest in the a.m.    Overall, the patient has improved and is hemodynamically stable at this  time.      Jana Hakim, M.D.  Electronically Signed     HJ/MEDQ  D:  07/24/2008  T:  07/24/2008  Job:  [id number]

## 2010-08-18 NOTE — H&P (Signed)
[place]  Patient:    Ann Fletcher, Ann Fletcher                      MRN: [id number] Adm. Date:  10/09/00 Attending:  Ophelia Charter, M.D.                         History and Physical  CHIEF COMPLAINT: Left leg pain.  HISTORY OF PRESENT ILLNESS: The patient is a 77 year old white female, who suffered an L4-5 herniated disk approximately one year ago.  I operated her on then and she did well until the beginning of June 2002 when she began having severe left leg pain and was worked up with a lumbar MRI that demonstrated recurrent herniated disk at L4-5 on the left.  The patients signs and symptoms and physical examination were consistent with a left L5 radiculopathy.  I discussed the various treatment options including conservative therapy and the patient decided to proceed with surgery after she failed medical management.  PAST MEDICAL HISTORY:  1. Lumbar herniated disk.  2. Left trochanteric bursitis.  3. Hypothyroidism.  4. Hypercholesterolemia.  5. Glaucoma.  6. Diabetes mellitus.  7. Hypertension.  8. Right ear problem.  PAST SURGICAL HISTORY:  1. Right ear surgery many years ago.  2. Bilateral eye surgery for glaucoma by Dr. Risa Grill.  MEDICATIONS PRIOR TO ADMISSION:  1. Levoxyl 0.137 mg p.o. q.d.  2. Amaryl 4 mg p.o. q.d.  3. Baycol 0.8 mg p.o. q.d.  4. Avandia 4 mg p.o. q.d.  5. Propranolol 20 mg p.o. q.d.  ALLERGIES: No known drug allergies. (CODEINE causes nausea).  FAMILY HISTORY: The patients mother died at age 64 secondary to pneumonia, she also suffered for alcoholic cirrhosis.  The patients father died at age 74 of a massive heart attack.  SOCIAL HISTORY: The patient is married and has three children.  She lives in [place], [place].  She is retired, housewife.  She denies tobacco, ethanol, or drug use.  REVIEW OF SYSTEMS: Negative except as above.  PHYSICAL EXAMINATION:  GENERAL: Pleasant, slow 77 year old  white female, complaining of left leg pain.  HEENT: Normal.  NECK: Normal.  LUNGS: Clear to auscultation.  HEART: Regular rate and rhythm.  ABDOMEN: Soft, nontender, obese.  EXTREMITIES: No obvious deformities.  BACK: She has a well-healed lumbar incision without signs of infection.  NEUROLOGIC: The patient is alert and oriented x 3, somewhat slow.  Cranial nerves 2-12 grossly intact bilaterally.  Vision and hearing grossly normal bilaterally.  Motor strength is 5/5 in the bilateral deltoid, biceps, triceps, hand grip, wrist extensor, interosseous psoas, quadriceps, and right extensor hallucis longus.  The left extensor hallucis longus strength is diminished at 4+/5.  Sensory examination demonstrates some decreased sensation in the L5 distribution, otherwise unremarkable.  Cerebellar examination is intact to rapid alternating movements of the upper extremities bilaterally.  IMAGING STUDIES: Lumbar MRI performed with and without contrast on September 11, 2000 at [place]. [facility] demonstrated straight lumbar spine, large recurrent disk herniation at L4-5 on the left.  ASSESSMENT: L4-5 recurrent herniated nucleus pulposus with degenerative disease and spinal stenosis, with lumbar radiculopathy.  PLAN: I have discussed the situation with the patient and her husband and reviewed the MRI scan with them, pointing out the abnormalities.  She is suffering from left L5 radiculopathy caused by recurrent disk herniation at L4-5.  I discussed the various treatment options including doing nothing, continued  medical management, steroid injections, and surgery.  I described the procedure of re-do left L4-5 microdiskectomy and have shown her surgical models.  I discussed the risks of surgery extensively.  The patient has weighed the risks and benefits and alternatives of surgery and wishes to proceed with the operation on October 09, 2000. DD:  10/09/00 TD:  10/09/00 Job:  [id number]

## 2010-10-22 ENCOUNTER — Inpatient Hospital Stay (INDEPENDENT_AMBULATORY_CARE_PROVIDER_SITE_OTHER)
Admission: RE | Admit: 2010-10-22 | Discharge: 2010-10-22 | Disposition: A | Discharge status: Home / Self Care | Payer: PRIVATE HEALTH INSURANCE | Source: Ambulatory Visit | Attending: Family Medicine | Admitting: Family Medicine

## 2010-10-22 ENCOUNTER — Inpatient Hospital Stay (HOSPITAL_COMMUNITY)
Admission: EM | Admit: 2010-10-22 | Discharge: 2010-10-25 | DRG: 690 | Disposition: A | Discharge status: Home Health | Payer: PRIVATE HEALTH INSURANCE | Attending: Internal Medicine | Admitting: Internal Medicine

## 2010-10-22 ENCOUNTER — Emergency Department (HOSPITAL_COMMUNITY): Payer: PRIVATE HEALTH INSURANCE

## 2010-10-22 ENCOUNTER — Ambulatory Visit (INDEPENDENT_AMBULATORY_CARE_PROVIDER_SITE_OTHER): Payer: PRIVATE HEALTH INSURANCE

## 2010-10-22 DIAGNOSIS — E785 Hyperlipidemia, unspecified: Secondary | ICD-10-CM | POA: Diagnosis present

## 2010-10-22 DIAGNOSIS — Z96659 Presence of unspecified artificial knee joint: Secondary | ICD-10-CM

## 2010-10-22 DIAGNOSIS — I10 Essential (primary) hypertension: Secondary | ICD-10-CM | POA: Diagnosis present

## 2010-10-22 DIAGNOSIS — E039 Hypothyroidism, unspecified: Secondary | ICD-10-CM | POA: Diagnosis present

## 2010-10-22 DIAGNOSIS — B952 Enterococcus as the cause of diseases classified elsewhere: Secondary | ICD-10-CM | POA: Diagnosis present

## 2010-10-22 DIAGNOSIS — N390 Urinary tract infection, site not specified: Principal | ICD-10-CM | POA: Diagnosis present

## 2010-10-22 DIAGNOSIS — N179 Acute kidney failure, unspecified: Secondary | ICD-10-CM | POA: Diagnosis present

## 2010-10-22 DIAGNOSIS — F411 Generalized anxiety disorder: Secondary | ICD-10-CM | POA: Diagnosis present

## 2010-10-22 DIAGNOSIS — Z7982 Long term (current) use of aspirin: Secondary | ICD-10-CM

## 2010-10-22 DIAGNOSIS — M6282 Rhabdomyolysis: Secondary | ICD-10-CM | POA: Diagnosis present

## 2010-10-22 DIAGNOSIS — E860 Dehydration: Secondary | ICD-10-CM | POA: Diagnosis present

## 2010-10-22 DIAGNOSIS — R509 Fever, unspecified: Secondary | ICD-10-CM

## 2010-10-22 DIAGNOSIS — E119 Type 2 diabetes mellitus without complications: Secondary | ICD-10-CM | POA: Diagnosis present

## 2010-10-22 LAB — URINE MICROSCOPIC-ADD ON

## 2010-10-22 LAB — POCT URINALYSIS DIP (DEVICE)
Bilirubin Urine: NEGATIVE
Glucose, UA: NEGATIVE mg/dL
Ketones, ur: NEGATIVE mg/dL
Leukocytes, UA: NEGATIVE
Nitrite: NEGATIVE
Protein, ur: NEGATIVE mg/dL
Specific Gravity, Urine: 1.01 (ref 1.005–1.030)
Urobilinogen, UA: 0.2 mg/dL (ref 0.0–1.0)
pH: 5.5 (ref 5.0–8.0)

## 2010-10-22 LAB — CK TOTAL AND CKMB (NOT AT ARMC)
CK, MB: 4.8 ng/mL — ABNORMAL HIGH (ref 0.3–4.0)
Relative Index: 0.8 (ref 0.0–2.5)
Total CK: 633 U/L — ABNORMAL HIGH (ref 7–177)

## 2010-10-22 LAB — COMPREHENSIVE METABOLIC PANEL
ALT: 29 U/L (ref 0–35)
AST: 67 U/L — ABNORMAL HIGH (ref 0–37)
Albumin: 3.7 g/dL (ref 3.5–5.2)
Alkaline Phosphatase: 68 U/L (ref 39–117)
BUN: 22 mg/dL (ref 6–23)
CO2: 24 mEq/L (ref 19–32)
Calcium: 9.5 mg/dL (ref 8.4–10.5)
Chloride: 92 mEq/L — ABNORMAL LOW (ref 96–112)
Creatinine, Ser: 1.02 mg/dL (ref 0.50–1.10)
GFR calc Af Amer: >60 mL/min (ref >60)
GFR calc non Af Amer: 53 mL/min — ABNORMAL LOW (ref >60)
Glucose, Bld: 192 mg/dL — ABNORMAL HIGH (ref 70–99)
Potassium: 4.4 mEq/L (ref 3.5–5.1)
Sodium: 132 mEq/L — ABNORMAL LOW (ref 135–145)
Total Bilirubin: 0.5 mg/dL (ref 0.3–1.2)
Total Protein: 8.4 g/dL — ABNORMAL HIGH (ref 6.0–8.3)

## 2010-10-22 LAB — DIFFERENTIAL
Basophils Absolute: 0 10*3/uL (ref 0.0–0.1)
Basophils Relative: 0 % (ref 0–1)
Eosinophils Absolute: 0 10*3/uL (ref 0.0–0.7)
Eosinophils Relative: 0 % (ref 0–5)
Lymphocytes Relative: 6 % — ABNORMAL LOW (ref 12–46)
Lymphs Abs: 0.6 10*3/uL — ABNORMAL LOW (ref 0.7–4.0)
Monocytes Absolute: 0.4 10*3/uL (ref 0.1–1.0)
Monocytes Relative: 4 % (ref 3–12)
Neutro Abs: 9.7 10*3/uL — ABNORMAL HIGH (ref 1.7–7.7)
Neutrophils Relative %: 91 % — ABNORMAL HIGH (ref 43–77)

## 2010-10-22 LAB — URINALYSIS, ROUTINE W REFLEX MICROSCOPIC
Glucose, UA: NEGATIVE mg/dL
Hgb urine dipstick: NEGATIVE
Ketones, ur: NEGATIVE mg/dL
Nitrite: NEGATIVE
Protein, ur: NEGATIVE mg/dL
Specific Gravity, Urine: 1.022 (ref 1.005–1.030)
Urobilinogen, UA: 0.2 mg/dL (ref 0.0–1.0)
pH: 5 (ref 5.0–8.0)

## 2010-10-22 LAB — CBC
HCT: 36.3 % (ref 36.0–46.0)
Hemoglobin: 12.4 g/dL (ref 12.0–15.0)
MCH: 27 pg (ref 26.0–34.0)
MCHC: 34.2 g/dL (ref 30.0–36.0)
MCV: 78.9 fL (ref 78.0–100.0)
Platelets: 195 10*3/uL (ref 150–400)
RBC: 4.6 MIL/uL (ref 3.87–5.11)
RDW: 14.8 % (ref 11.5–15.5)
WBC: 10.7 10*3/uL — ABNORMAL HIGH (ref 4.0–10.5)

## 2010-10-22 LAB — TROPONIN I: Troponin I: <0.3 ng/mL (ref <0.30)

## 2010-10-22 LAB — LACTIC ACID, PLASMA: Lactic Acid, Venous: 2.3 mmol/L — ABNORMAL HIGH (ref 0.5–2.2)

## 2010-10-22 LAB — POCT I-STAT, CHEM 8
BUN: 23 mg/dL (ref 6–23)
Calcium, Ion: 1.01 mmol/L — ABNORMAL LOW (ref 1.12–1.32)
Chloride: 98 mEq/L (ref 96–112)
Creatinine, Ser: 1.1 mg/dL (ref 0.50–1.10)
Glucose, Bld: 225 mg/dL — ABNORMAL HIGH (ref 70–99)
HCT: 41 % (ref 36.0–46.0)
Hemoglobin: 13.9 g/dL (ref 12.0–15.0)
Potassium: 3.7 mEq/L (ref 3.5–5.1)
Sodium: 133 mEq/L — ABNORMAL LOW (ref 135–145)
TCO2: 23 mmol/L (ref 0–100)

## 2010-10-22 LAB — LIPASE, BLOOD: Lipase: 19 U/L (ref 11–59)

## 2010-10-23 LAB — CBC
HCT: 34.5 % — ABNORMAL LOW (ref 36.0–46.0)
HCT: 33.9 % — ABNORMAL LOW (ref 36.0–46.0)
HCT: 31.5 % — ABNORMAL LOW (ref 36.0–46.0)
Hemoglobin: 11.2 g/dL — ABNORMAL LOW (ref 12.0–15.0)
Hemoglobin: 11.2 g/dL — ABNORMAL LOW (ref 12.0–15.0)
Hemoglobin: 10.6 g/dL — ABNORMAL LOW (ref 12.0–15.0)
MCH: 26.1 pg (ref 26.0–34.0)
MCH: 26.5 pg (ref 26.0–34.0)
MCH: 26.8 pg (ref 26.0–34.0)
MCHC: 32.5 g/dL (ref 30.0–36.0)
MCHC: 33 g/dL (ref 30.0–36.0)
MCHC: 33.7 g/dL (ref 30.0–36.0)
MCV: 80.4 fL (ref 78.0–100.0)
MCV: 80.1 fL (ref 78.0–100.0)
MCV: 79.7 fL (ref 78.0–100.0)
Platelets: 170 10*3/uL (ref 150–400)
Platelets: 157 10*3/uL (ref 150–400)
Platelets: 183 10*3/uL (ref 150–400)
RBC: 4.29 MIL/uL (ref 3.87–5.11)
RBC: 4.23 MIL/uL (ref 3.87–5.11)
RBC: 3.95 MIL/uL (ref 3.87–5.11)
RDW: 15.1 % (ref 11.5–15.5)
RDW: 14.9 % (ref 11.5–15.5)
RDW: 14.8 % (ref 11.5–15.5)
WBC: 8.1 10*3/uL (ref 4.0–10.5)
WBC: 6.7 10*3/uL (ref 4.0–10.5)
WBC: 6.2 10*3/uL (ref 4.0–10.5)

## 2010-10-23 LAB — BASIC METABOLIC PANEL
BUN: 20 mg/dL (ref 6–23)
BUN: 18 mg/dL (ref 6–23)
BUN: 16 mg/dL (ref 6–23)
CO2: 27 mEq/L (ref 19–32)
CO2: 25 mEq/L (ref 19–32)
CO2: 22 mEq/L (ref 19–32)
Calcium: 8.8 mg/dL (ref 8.4–10.5)
Calcium: 8.5 mg/dL (ref 8.4–10.5)
Calcium: 8.5 mg/dL (ref 8.4–10.5)
Chloride: 99 mEq/L (ref 96–112)
Chloride: 101 mEq/L (ref 96–112)
Chloride: 98 mEq/L (ref 96–112)
Creatinine, Ser: 0.83 mg/dL (ref 0.50–1.10)
Creatinine, Ser: 0.65 mg/dL (ref 0.50–1.10)
Creatinine, Ser: 0.62 mg/dL (ref 0.50–1.10)
GFR calc Af Amer: >60 mL/min (ref >60)
GFR calc Af Amer: >60 mL/min (ref >60)
GFR calc Af Amer: >60 mL/min (ref >60)
GFR calc non Af Amer: >60 mL/min (ref >60)
GFR calc non Af Amer: >60 mL/min (ref >60)
GFR calc non Af Amer: >60 mL/min (ref >60)
Glucose, Bld: 144 mg/dL — ABNORMAL HIGH (ref 70–99)
Glucose, Bld: 113 mg/dL — ABNORMAL HIGH (ref 70–99)
Glucose, Bld: 135 mg/dL — ABNORMAL HIGH (ref 70–99)
Potassium: 3.7 mEq/L (ref 3.5–5.1)
Potassium: 3.6 mEq/L (ref 3.5–5.1)
Potassium: 3.7 mEq/L (ref 3.5–5.1)
Sodium: 137 mEq/L (ref 135–145)
Sodium: 135 mEq/L (ref 135–145)
Sodium: 130 mEq/L — ABNORMAL LOW (ref 135–145)

## 2010-10-23 LAB — CARDIAC PANEL(CRET KIN+CKTOT+MB+TROPI)
CK, MB: 4.9 ng/mL — ABNORMAL HIGH (ref 0.3–4.0)
CK, MB: 3.7 ng/mL (ref 0.3–4.0)
CK, MB: 4 ng/mL (ref 0.3–4.0)
Relative Index: 0.5 (ref 0.0–2.5)
Relative Index: 0.4 (ref 0.0–2.5)
Relative Index: 0.4 (ref 0.0–2.5)
Total CK: 968 U/L — ABNORMAL HIGH (ref 7–177)
Total CK: 900 U/L — ABNORMAL HIGH (ref 7–177)
Total CK: 911 U/L — ABNORMAL HIGH (ref 7–177)
Troponin I: <0.3 ng/mL (ref <0.30)
Troponin I: <0.3 ng/mL (ref <0.30)
Troponin I: <0.3 ng/mL (ref <0.30)

## 2010-10-23 LAB — GLUCOSE, CAPILLARY
Glucose-Capillary: 158 mg/dL — ABNORMAL HIGH (ref 70–99)
Glucose-Capillary: 165 mg/dL — ABNORMAL HIGH (ref 70–99)
Glucose-Capillary: 149 mg/dL — ABNORMAL HIGH (ref 70–99)
Glucose-Capillary: 242 mg/dL — ABNORMAL HIGH (ref 70–99)

## 2010-10-23 LAB — HEPATIC FUNCTION PANEL
ALT: 25 U/L (ref 0–35)
AST: 52 U/L — ABNORMAL HIGH (ref 0–37)
Albumin: 3.3 g/dL — ABNORMAL LOW (ref 3.5–5.2)
Alkaline Phosphatase: 57 U/L (ref 39–117)
Bilirubin, Direct: 0.1 mg/dL (ref 0.0–0.3)
Indirect Bilirubin: 0.3 mg/dL (ref 0.3–0.9)
Total Bilirubin: 0.4 mg/dL (ref 0.3–1.2)
Total Protein: 7.1 g/dL (ref 6.0–8.3)

## 2010-10-23 LAB — LACTIC ACID, PLASMA: Lactic Acid, Venous: 1 mmol/L (ref 0.5–2.2)

## 2010-10-23 LAB — TSH: TSH: 0.196 u[IU]/mL — ABNORMAL LOW (ref 0.350–4.500)

## 2010-10-23 LAB — CK: Total CK: 715 U/L — ABNORMAL HIGH (ref 7–177)

## 2010-10-24 LAB — URINE CULTURE
Colony Count: 60000
Culture  Setup Time: 201207230120

## 2010-10-24 LAB — BASIC METABOLIC PANEL
BUN: 13 mg/dL (ref 6–23)
BUN: 14 mg/dL (ref 6–23)
CO2: 24 mEq/L (ref 19–32)
CO2: 23 mEq/L (ref 19–32)
Calcium: 8.2 mg/dL — ABNORMAL LOW (ref 8.4–10.5)
Calcium: 9 mg/dL (ref 8.4–10.5)
Chloride: 104 mEq/L (ref 96–112)
Chloride: 100 mEq/L (ref 96–112)
Creatinine, Ser: 0.59 mg/dL (ref 0.50–1.10)
Creatinine, Ser: 0.54 mg/dL (ref 0.50–1.10)
GFR calc Af Amer: >60 mL/min (ref >60)
GFR calc Af Amer: >60 mL/min (ref >60)
GFR calc non Af Amer: >60 mL/min (ref >60)
GFR calc non Af Amer: >60 mL/min (ref >60)
Glucose, Bld: 115 mg/dL — ABNORMAL HIGH (ref 70–99)
Glucose, Bld: 257 mg/dL — ABNORMAL HIGH (ref 70–99)
Potassium: 3.7 mEq/L (ref 3.5–5.1)
Potassium: 4.6 mEq/L (ref 3.5–5.1)
Sodium: 136 mEq/L (ref 135–145)
Sodium: 134 mEq/L — ABNORMAL LOW (ref 135–145)

## 2010-10-24 LAB — CBC
HCT: 32.2 % — ABNORMAL LOW (ref 36.0–46.0)
HCT: 34.6 % — ABNORMAL LOW (ref 36.0–46.0)
HCT: 33.8 % — ABNORMAL LOW (ref 36.0–46.0)
Hemoglobin: 10.5 g/dL — ABNORMAL LOW (ref 12.0–15.0)
Hemoglobin: 11.4 g/dL — ABNORMAL LOW (ref 12.0–15.0)
Hemoglobin: 11.2 g/dL — ABNORMAL LOW (ref 12.0–15.0)
MCH: 26.1 pg (ref 26.0–34.0)
MCH: 26.4 pg (ref 26.0–34.0)
MCH: 26.2 pg (ref 26.0–34.0)
MCHC: 32.6 g/dL (ref 30.0–36.0)
MCHC: 32.9 g/dL (ref 30.0–36.0)
MCHC: 33.1 g/dL (ref 30.0–36.0)
MCV: 80.1 fL (ref 78.0–100.0)
MCV: 80.1 fL (ref 78.0–100.0)
MCV: 79 fL (ref 78.0–100.0)
Platelets: 154 10*3/uL (ref 150–400)
Platelets: 176 10*3/uL (ref 150–400)
Platelets: 175 10*3/uL (ref 150–400)
RBC: 4.02 MIL/uL (ref 3.87–5.11)
RBC: 4.32 MIL/uL (ref 3.87–5.11)
RBC: 4.28 MIL/uL (ref 3.87–5.11)
RDW: 14.8 % (ref 11.5–15.5)
RDW: 14.8 % (ref 11.5–15.5)
RDW: 14.5 % (ref 11.5–15.5)
WBC: 5.6 10*3/uL (ref 4.0–10.5)
WBC: 5.2 10*3/uL (ref 4.0–10.5)
WBC: 5.6 10*3/uL (ref 4.0–10.5)

## 2010-10-24 LAB — GLUCOSE, CAPILLARY
Glucose-Capillary: 102 mg/dL — ABNORMAL HIGH (ref 70–99)
Glucose-Capillary: 114 mg/dL — ABNORMAL HIGH (ref 70–99)
Glucose-Capillary: 124 mg/dL — ABNORMAL HIGH (ref 70–99)
Glucose-Capillary: 190 mg/dL — ABNORMAL HIGH (ref 70–99)
Glucose-Capillary: 234 mg/dL — ABNORMAL HIGH (ref 70–99)

## 2010-10-24 LAB — CK
Total CK: 534 U/L — ABNORMAL HIGH (ref 7–177)
Total CK: 548 U/L — ABNORMAL HIGH (ref 7–177)

## 2010-10-24 LAB — HEMOGLOBIN A1C
Hgb A1c MFr Bld: 7.6 % — ABNORMAL HIGH (ref <5.7)
Mean Plasma Glucose: 171 mg/dL — ABNORMAL HIGH (ref <117)

## 2010-10-25 LAB — CBC
HCT: 32.8 % — ABNORMAL LOW (ref 36.0–46.0)
Hemoglobin: 10.9 g/dL — ABNORMAL LOW (ref 12.0–15.0)
MCH: 26.2 pg (ref 26.0–34.0)
MCHC: 33.2 g/dL (ref 30.0–36.0)
MCV: 78.8 fL (ref 78.0–100.0)
Platelets: 172 10*3/uL (ref 150–400)
RBC: 4.16 MIL/uL (ref 3.87–5.11)
RDW: 14.4 % (ref 11.5–15.5)
WBC: 5.5 10*3/uL (ref 4.0–10.5)

## 2010-10-25 LAB — GLUCOSE, CAPILLARY: Glucose-Capillary: 203 mg/dL — ABNORMAL HIGH (ref 70–99)

## 2010-10-25 LAB — BASIC METABOLIC PANEL
BUN: 9 mg/dL (ref 6–23)
BUN: 7 mg/dL (ref 6–23)
CO2: 26 mEq/L (ref 19–32)
CO2: 25 mEq/L (ref 19–32)
Calcium: 9.2 mg/dL (ref 8.4–10.5)
Calcium: 9.1 mg/dL (ref 8.4–10.5)
Chloride: 101 mEq/L (ref 96–112)
Chloride: 100 mEq/L (ref 96–112)
Creatinine, Ser: 0.48 mg/dL — ABNORMAL LOW (ref 0.50–1.10)
Creatinine, Ser: 0.49 mg/dL — ABNORMAL LOW (ref 0.50–1.10)
GFR calc Af Amer: >60 mL/min (ref >60)
GFR calc Af Amer: >60 mL/min (ref >60)
GFR calc non Af Amer: >60 mL/min (ref >60)
GFR calc non Af Amer: >60 mL/min (ref >60)
Glucose, Bld: 182 mg/dL — ABNORMAL HIGH (ref 70–99)
Glucose, Bld: 208 mg/dL — ABNORMAL HIGH (ref 70–99)
Potassium: 4.5 mEq/L (ref 3.5–5.1)
Potassium: 3.7 mEq/L (ref 3.5–5.1)
Sodium: 134 mEq/L — ABNORMAL LOW (ref 135–145)
Sodium: 135 mEq/L (ref 135–145)

## 2010-10-25 LAB — CK
Total CK: 449 U/L — ABNORMAL HIGH (ref 7–177)
Total CK: 335 U/L — ABNORMAL HIGH (ref 7–177)

## 2010-10-26 LAB — GLUCOSE, CAPILLARY: Glucose-Capillary: 209 mg/dL — ABNORMAL HIGH (ref 70–99)

## 2010-10-29 LAB — CULTURE, BLOOD (ROUTINE X 2)
Culture  Setup Time: 201207230832
Culture  Setup Time: 201207230833
Culture: NO GROWTH
Culture: NO GROWTH

## 2010-10-30 NOTE — Discharge Summary (Signed)
  Ann Fletcher, Ann Fletcher                ACCOUNT NO.:  [id number]  MEDICAL RECORD NO.:  [id number]  LOCATION:  [id number]                         FACILITY:  [facility]  PHYSICIAN:  Edythe Lynn, M.D.       DATE OF BIRTH:  1932-09-18  DATE OF ADMISSION:  10/22/2010 DATE OF DISCHARGE:  10/25/2010                              DISCHARGE SUMMARY   PRIMARY CARE PHYSICIAN:  [name], MD  DISCHARGE DIAGNOSES: 1. Enterococcus urinary tract infection. 2. Mild rhabdomyolysis, improved. 3. A mild dehydration, resolved. 4. Diabetes mellitus type 2. 5. History of lumbar spine surgery. 6. Hypertension. 7. Hyperlipidemia.  DISCHARGE MEDICATIONS: 1. Amoxicillin 500 mg by mouth three times day for 1 week. 2. Magnesium oxide 400 mg daily. 3. Ativan 1 mg by mouth three times a day as needed for anxiety. 4. Toprol-XL 25 mg daily. 5. Zocor 20 mg daily. 6. Aspirin 81 mg daily. 7. Albuterol 90 mcg inhaled 2 puffs three times a day as needed. 8. Amaryl 4 mg daily. 9. Levothyroxine 75 mcg daily. 10.Vitamin E 400 mg daily.  CONDITION ON DISCHARGE:  [name] was discharged in good condition. She will follow up with the primary care physician.  PROCEDURE IN THIS ADMISSION:  The patient underwent a urine culture showing Enterococcus, sensitive to ampicillin.  CONSULTATION IN THIS ADMISSION:  No consultation obtained.  HISTORY AND PHYSICAL:  Refer to the dictated H and P done by Dr. Raelyn Number.  HOSPITAL COURSE:  [name] is a 77 year old with multiple medical problems who was transferred Urgent Care after she presented there with complaints of nausea, vomiting, and fevers.  She was diagnosed with urinary tract infection, possible early pyelonephritis.  She was given intravenous ceftriaxone.  It was also noted that she was having mild rhabdomyolysis and she was taken off the Zocor while in hospital.  She was hydrated with intravenous fluids and she responded well with improvement in her symptoms.  Later  on, the urine culture resulted in Enterococcus species, so the patient's antibiotics were switched to oral amoxicillin.  The CK level initially of 715, improved to level of 235 by the time of discharge.  We felt that the source of the elevated creatine kinase was definitely muscular in nature and not cardiac as the patient had troponin-I less than 0.030.  We opted to decrease the dose of Zocor 20 mg daily and the patient will need followup creatine kinase level in the office.     Edythe Lynn, M.D.     SL/MEDQ  D:  10/26/2010  T:  10/27/2010  Job:  [id number]  cc:   [name], M.D.  Electronically Signed by Edythe Lynn M.D. on 10/30/2010 05:34:04 PM

## 2011-01-12 NOTE — H&P (Signed)
NAME:  Ann Fletcher, Ann Fletcher                ACCOUNT NO.:  [id number]  MEDICAL RECORD NO.:  [id number]  LOCATION:                                 FACILITY:  PHYSICIAN:  Lyn Records, MD      DATE OF BIRTH:  08/11/1932  DATE OF ADMISSION:  10/23/2010 DATE OF DISCHARGE:                             HISTORY & PHYSICAL   PRIMARY CARE PHYSICIAN:  [name], MD  CHIEF COMPLAINT:  Nausea, vomiting, and fevers.  HISTORY OF PRESENT ILLNESS:  The patient is a 77 year old woman with a history of hypertension, hyperlipidemia, diabetes, recent UTI, started on Bactrim who presents today with a 2-day history of nausea and vomiting and she was noted to be febrile at Urgent Care today.  The patient reports that she went in to her primary care doctor's office a few days ago for her regular checkup and was diagnosed with a urinary tract infection.  At that time, she was given a prescription for Bactrim.  Since she started taking the Bactrim, she has reported that she has been throwing up about 2-3 times per day, the vomitus is greenish in color.  She has not had any abdominal pain.  She is able to eat crackers and drink water but otherwise has had a decreased appetite. She has not had any fevers at home.  However, when she went to Urgent Care today, she was noted to have a fever of 101.7.  She denies any diarrhea, chest pain, shortness of breath.  She has no other acute complaints.  REVIEW OF SYSTEMS:  Review of 10 organ systems was done and is negative except as stated above in the HPI.  ALLERGIES:  No known drug allergies.  MEDICATIONS:  Medications per the patient's home medication bottles: 1. Vitamin E supplementation. 2. Magnesium oxide 400 mg daily. 3. Levothyroxine 100 mcg daily. 4. Bactrim. 5. Glimepiride 4 mg twice a day. 6. Lorazepam 1 mg t.i.d. p.r.n. anxiety. 7. ProAir p.r.n. shortness of breath. 8. Metoprolol succinate 25 mg daily. 9. Furosemide 40 mg daily. 10.Aspirin 81 mg  daily. 11.Simvastatin 40 mg daily.  PAST MEDICAL HISTORY: 1. Diabetes. 2. Anxiety. 3. Hypertension. 4. Hyperlipidemia. 5. UTI. 6. Right total knee arthroplasty. 7. Osteoarthritis.  The patient reports that she had a surgery for "sterilization."  She has decreased hearing on her right side and had an unknown ear surgery to that side.  SOCIAL HISTORY:  Lifelong nonsmoker.  No alcohol.  FAMILY HISTORY:  A sister has diabetes.  PHYSICAL EXAMINATION:  VITAL SIGNS:  153/53, 78, 24, 99.0. GENERAL:  The patient is in no acute distress. HEENT:  Mucous membranes are moist.  Sclerae anicteric. CV:  Regular rate and rhythm.  No murmurs, rubs, or gallops. LUNGS:  Fine crackles in left base, otherwise clear.  No wheezes. Nonlabored respiratory effort. ABDOMEN:  Obese, soft, nontender, nondistended.  Positive bowel sounds. EXTREMITIES:  Trace pitting lower extremity edema bilaterally. SKIN:  No rashes. PSYCH:  Normal mood and affect. NEURO:  Nonfocal.  LABORATORY DATA:  White count 10.7, hemoglobin 12.4, platelets 195. Sodium 132, potassium 4.4, but hemolyzed, chloride 92, bicarb 24, glucose 192, BUN 22, creatinine 1.02 last creatinine in 2011  was 0.63, AST is 67, ALT is 29, but again these labs are hemolyzed. Lipase 19, lactic acid 2.3, CK 633, MB 4.8, troponin less than 0.30.  UA shows negative protein, negative nitrites, small leukocyte esterase. Microscopic; 3-6 wbc's, uric acid crystals, and hyaline casts and a few bacteria.  Chest x-ray shows stable cardiomegaly, pulmonary venous hypertension.  No acute findings.  Abdominal x-ray shows possible cholelithiasis, normal bowel gas pattern.  Abdominal ultrasound shows cholelithiasis but no sonographic findings for acute cholecystitis. Normal caliber common bile duct, diffuse fatty infiltration of the liver, poor visualization of the pancreas.  The right kidney had normal renal cortical thickness and echogenicity without focal lesions  or hydronephrosis.  Left kidney was normal as well with no focal lesions or hydronephrosis.  EKG shows sinus rhythm with a rate of 77 with nonspecific ST changes, which were similar compared to a prior EKG, July 27, 2008.  The patient had an echo on July 26, 2008 showing an EF of 65-70%.  ASSESSMENT AND PLAN:  The patient is a 77 year old woman with hypertension, diabetes, hyperlipidemia, and recent urinary tract infection who presents with nausea and vomiting. 1. Nausea and vomiting.  The patient has essentially had a negative GI     workup in the emergency department including an abdominal series     showing no obstruction, an abdominal ultrasound showing no signs of     cholecystitis.  I am wondering if this is just a progression of her     urinary tract infection and possible early pyelonephritis.  The     patient does not have any complaints of chest pain or shortness of     breath, so my suspicion for cardiac etiology is low.  The patient     has an increased lactate which is likely due to dehydration from     the nausea and vomiting.  At this time, we will put the patient on     gentle IV fluids, to keep her n.p.o., and treat her supportively     with Zofran and Phenergan.  We will have to monitor the patient     closely for fluid overload given crackles in the left base;     however, the crackles could just be atelectasis.  She does not have     any signs of acute fluid overload on her chest x-ray.  After     hydration, we will follow up a repeat lactate in the morning. 2. Urinary tract infection.  The patient has a fever and a white count     and a urinalysis showing signs of infection.  At this time, given     the  fact that the patient failed outpatient Bactrim therapy, we     will put her on ceftriaxone IV.  We will check blood cultures and     urine cultures.  We will monitor closely for improvement. 3. Elevated CK to 633 with an MB of 5 and a negative troponin.  It is      unclear the significance of these elevated enzymes and the patient     does not have any signs of acute ischemia on her EKG.  We will     monitor the patient on tele and check serial cardiac enzymes. 4. Acute kidney injury.  The patient's baseline creatinine is around     0.6.  Today, her creatinine is 1.02.  This is most likely due to     dehydration.  We will  give the patient maintenance IV fluids and     repeat a creatinine in the morning. 5. Prolonged QT.  The patient's QT is slightly longer on today's EKG     than it has been in the past.  This is probably due to medication     effect and also due to electrolyte abnormalities from vomiting.  We     will monitor the patient on tele and repeat an EKG in the morning     to ensure resolution. 6. Diabetes.  We will hold the patient's home glimepiride and put her     on sliding scale insulin.  She did come with bottles of metformin     with her medication bottles, but she states that she is no longer     taking metformin, that she is just taking glimepiride. 7. Hypertension.  The patient is hemodynamically stable.  At this     point, we will continue the patient's home blood pressure meds. 8. Anxiety.  We will continue the patient's home lorazepam as needed. 9. Hypothyroidism.  Continue the patient's home levothyroxine. 10.Fluid, electrolytes, nutrition.  We will put the patient on     maintenance IV fluids as above.  We will replete electrolytes as     needed and make the patient n.p.o. until she is no longer vomiting     and then can start clear liquid diet and advance as tolerated. 11.Prophylaxis.  Lovenox for deep vein thrombosis prophylaxis.  DISPOSITION:  The patient had a difficult time deciding on code status. She states that she does have a living will; however, she does not know what the living will says.  After a long discussion with the patient, it sounds like she would want an initial chance of resuscitation and  her husband would make the decision about prolonged care if she was "a vegetable."  So, at this time, the patient will be full code and she will continue to readdress with the team if she changes her mind.          ______________________________ Lyn Records, MD     JF/MEDQ  D:  10/23/2010  T:  10/23/2010  Job:  [id number]  Electronically Signed by Lyn Records MD on 01/12/2011 01:37:08 PM

## 2012-12-13 ENCOUNTER — Emergency Department (INDEPENDENT_AMBULATORY_CARE_PROVIDER_SITE_OTHER)
Admission: EM | Admit: 2012-12-13 | Discharge: 2012-12-13 | Disposition: A | Discharge status: Home / Self Care | Payer: PRIVATE HEALTH INSURANCE | Source: Home / Self Care | Attending: Family Medicine | Admitting: Family Medicine

## 2012-12-13 ENCOUNTER — Encounter (HOSPITAL_COMMUNITY): Payer: Self-pay | Admitting: *Deleted

## 2012-12-13 ENCOUNTER — Emergency Department (INDEPENDENT_AMBULATORY_CARE_PROVIDER_SITE_OTHER): Payer: PRIVATE HEALTH INSURANCE

## 2012-12-13 DIAGNOSIS — J069 Acute upper respiratory infection, unspecified: Secondary | ICD-10-CM

## 2012-12-13 DIAGNOSIS — J309 Allergic rhinitis, unspecified: Secondary | ICD-10-CM

## 2012-12-13 HISTORY — DX: Age-related osteoporosis without current pathological fracture: M81.0

## 2012-12-13 HISTORY — DX: Type 2 diabetes mellitus without complications: E11.9

## 2012-12-13 HISTORY — DX: Unspecified osteoarthritis, unspecified site: M19.90

## 2012-12-13 MED ORDER — FLUTICASONE PROPIONATE 50 MCG/ACT NA SUSP: 2.0000 | Freq: Every day | NASAL | Status: DC

## 2012-12-13 MED ORDER — HYDROCOD POLST-CHLORPHEN POLST 10-8 MG/5ML PO LQCR: 5.0000 mL | Freq: Every evening | ORAL | Status: DC | PRN

## 2012-12-13 NOTE — ED Provider Notes (Signed)
CSN: [id number]     Arrival date & time 12/13/12  1421 History   First MD Initiated Contact with Patient 12/13/12 1509     Chief Complaint  Patient presents with  . Cough   (Consider location/radiation/quality/duration/timing/severity/associated sxs/prior Treatment) HPI Comments: 79 year old female presents complaining of cough, nasal congestion, sneezing, sore throat, fatigue. This is been going on for about 2 days. The cough happens all day but is worse at night when she lays down. The sore throat is described as being more scratchy than actually sore and painful. She states she does not feel sick right now, she cannot stop coughing which is making her tired. She denies leg swelling, chest pain, shortness of breath, history of DVT or PE, pleuritic chest pain  Patient is a 79 y.o. female presenting with cough.  Cough Associated symptoms: rhinorrhea and sore throat   Associated symptoms: no chest pain, no chills, no ear pain, no fever, no myalgias, no rash and no shortness of breath     Past Medical History  Diagnosis Date  . Osteoporosis   . Diabetes mellitus without complication   . Thyroid disease   . High cholesterol   . Arthritis    Past Surgical History  Procedure Laterality Date  . Back surgery  2001, 2003, 2006    x 3  . Replacement total knee Right 07/2008  . Tubal ligation     Family History  Problem Relation Age of Onset  . Pneumonia Mother   . Heart attack Father   . Diabetes Sister    History  Substance Use Topics  . Smoking status: Never Smoker   . Smokeless tobacco: Not on file  . Alcohol Use: No   OB History   Grav Para Term Preterm Abortions TAB SAB Ect Mult Living                 Review of Systems  Constitutional: Positive for fatigue. Negative for fever and chills.  HENT: Positive for congestion, sore throat, rhinorrhea, sneezing and postnasal drip. Negative for ear pain, neck pain and sinus pressure.   Eyes: Negative for visual disturbance.   Respiratory: Positive for cough. Negative for shortness of breath.   Cardiovascular: Negative for chest pain, palpitations and leg swelling.  Gastrointestinal: Negative for nausea, vomiting and abdominal pain.  Endocrine: Negative for polydipsia and polyuria.  Genitourinary: Negative for dysuria, urgency and frequency.  Musculoskeletal: Negative for myalgias and arthralgias.  Skin: Negative for rash.  Neurological: Negative for dizziness, weakness and light-headedness.    Allergies  Nsaids  Home Medications Vitals rechecked, the oxygen saturation is 96% and the pulse is 92.   Current Outpatient Rx  Name  Route  Sig  Dispense  Refill  . albuterol (PROVENTIL HFA;VENTOLIN HFA) 108 (90 BASE) MCG/ACT inhaler   Inhalation   Inhale 2 puffs into the lungs 3 (three) times daily as needed for wheezing.         [name] aspirin 81 MG tablet   Oral   Take 81 mg by mouth daily.         [name] atorvastatin (LIPITOR) 40 MG tablet   Oral   Take 40 mg by mouth daily.         . calcium-vitamin D (OSCAL WITH D) 500-200 MG-UNIT per tablet   Oral   Take 1 tablet by mouth.         . cholecalciferol (VITAMIN D) 400 UNITS TABS tablet   Oral   Take 400 Units by mouth  2 (two) times daily.         . furosemide (LASIX) 40 MG tablet   Oral   Take 40 mg by mouth daily.         [name] glimepiride (AMARYL) 4 MG tablet   Oral   Take 4 mg by mouth 2 (two) times daily with a meal.         . levothyroxine (SYNTHROID, LEVOTHROID) 75 MCG tablet   Oral   Take 75 mcg by mouth daily before breakfast.         . LORazepam (ATIVAN) 1 MG tablet   Oral   Take 1 mg by mouth every 8 (eight) hours as needed for anxiety.         . meloxicam (MOBIC) 7.5 MG tablet   Oral   Take 7.5 mg by mouth daily.         . metoprolol succinate (TOPROL-XL) 25 MG 24 hr tablet   Oral   Take 25 mg by mouth daily.         . pioglitazone (ACTOS) 30 MG tablet   Oral   Take 30 mg by mouth daily.         . vitamin  E 400 UNIT capsule   Oral   Take 400 Units by mouth daily.         . chlorpheniramine-HYDROcodone (TUSSIONEX PENNKINETIC ER) 10-8 MG/5ML LQCR   Oral   Take 5 mLs by mouth at bedtime as needed.   115 mL   0   . fluticasone (FLONASE) 50 MCG/ACT nasal spray   Nasal   Place 2 sprays into the nose daily.   16 g   2    BP 189/69  Pulse 105  Temp(Src) 98.6 F (37 C) (Oral)  Resp 16  SpO2 93% Physical Exam  Nursing note and vitals reviewed. Constitutional: She is oriented to person, place, and time. Vital signs are normal. She appears well-developed and well-nourished. No distress.  HENT:  Head: Normocephalic and atraumatic.  Right Ear: External ear normal.  Left Ear: External ear normal.  Nose: Nose normal.  Mouth/Throat: Oropharynx is clear and moist. No oropharyngeal exudate.  Cardiovascular: Normal rate, regular rhythm and normal heart sounds.  Exam reveals no gallop and no friction rub.   No murmur heard. Pulmonary/Chest: Effort normal and breath sounds normal. No respiratory distress. She has no wheezes. She has no rales.  Neurological: She is alert and oriented to person, place, and time. She has normal strength. Coordination normal.  Skin: Skin is warm and dry. No rash noted. She is not diaphoretic.  Psychiatric: She has a normal mood and affect. Judgment normal.    ED Course  Procedures (including critical care time) Labs Review Labs Reviewed - No data to display Imaging Review Dg Chest 2 View  12/13/2012   CLINICAL DATA:  Cough, wheezing  EXAM: CHEST  2 VIEW  COMPARISON:  10/22/2010  FINDINGS: Cardiomegaly again noted. Stable central mild vascular congestion. No acute infiltrate or pulmonary edema. Mild degenerative changes mid thoracic spine.  IMPRESSION: No active disease. Cardiomegaly again noted.   Electronically Signed   By: Lahoma Crocker   On: 12/13/2012 15:51    MDM   1. URI (upper respiratory infection)   2. Allergic rhinosinusitis    Treat  symptomatically with Flonase and a low dose of Tussionex before bedtime. Followup if not improving.    Liam Graham, PA-C 12/13/12 1610

## 2012-12-13 NOTE — ED Notes (Signed)
C/o cough x 2-3 days.  Mostly non-productive.  No fever.  C/o fatigue. No sorethroat. Has runny nose sometimes and sneezed this morning.

## 2012-12-14 NOTE — ED Provider Notes (Signed)
Medical screening examination/treatment/procedure(s) were performed by a resident physician or non-physician practitioner and as the supervising physician I was immediately available for consultation/collaboration.  Lynne Leader, MD    Gregor Hams, MD 12/14/12 [phone]

## 2013-04-29 ENCOUNTER — Ambulatory Visit: Payer: Self-pay | Admitting: Podiatry

## 2013-05-04 ENCOUNTER — Ambulatory Visit: Payer: Self-pay | Admitting: Podiatry

## 2013-05-14 ENCOUNTER — Ambulatory Visit (INDEPENDENT_AMBULATORY_CARE_PROVIDER_SITE_OTHER): Payer: Medicare Other | Admitting: Podiatrist

## 2013-05-14 ENCOUNTER — Encounter: Payer: Self-pay | Admitting: Podiatrist

## 2013-05-14 VITALS — BP 159/76 | HR 100 | Resp 12

## 2013-05-14 DIAGNOSIS — M2040 Other hammer toe(s) (acquired), unspecified foot: Secondary | ICD-10-CM

## 2013-05-14 DIAGNOSIS — L84 Corns and callosities: Secondary | ICD-10-CM

## 2013-05-14 NOTE — Progress Notes (Signed)
   Subjective:    Patient ID: Ann Fletcher, female    DOB: [birth year], 80 y.o.   MRN: [id number]  HPI'' TOENAILS AND CALLUS TRIM ON THE LT 5TH TOE.''    Review of Systems  Respiratory: Positive for shortness of breath and wheezing.   All other systems reviewed and are negative.       Objective:   Physical Exam GENERAL APPEARANCE: Alert, conversant. Appropriately groomed. No acute distress.  VASCULAR: Pedal pulses palpable at 2/4 DP and PT bilateral.  Capillary refill time is immediate to all digits,  Proximal to distal cooling it warm to warm.  Digital hair growth is present bilateral  NEUROLOGIC: sensation is intact epicritically and protectively to 5.07 monofilament at 5/5 sites bilateral.  Light touch is intact bilateral, vibratory sensation intact bilateral, achilles tendon reflex is intact bilateral.  MUSCULOSKELETAL: acceptable muscle strength, tone and stability bilateral.  Intrinsic muscluature intact bilateral.  Hammertoe 5th left DERMATOLOGIC: skin color, texture, and turger are within normal limits. Hyperkeratotic lesion present 5 th digit left. No sign of infection present.      Assessment & Plan:  Hammertoe with overlying corn- non infected left Plan:  Debridement of lesion carried out with a  15 blade.  Paiding was applied and patient given instructions for aftercare.

## 2013-07-09 ENCOUNTER — Ambulatory Visit: Payer: PRIVATE HEALTH INSURANCE | Admitting: *Deleted

## 2013-08-20 ENCOUNTER — Encounter: Payer: Self-pay | Admitting: Podiatrist

## 2013-08-20 ENCOUNTER — Ambulatory Visit (INDEPENDENT_AMBULATORY_CARE_PROVIDER_SITE_OTHER): Payer: Medicare Other | Admitting: Podiatrist

## 2013-08-20 VITALS — BP 164/74 | HR 100 | Resp 12

## 2013-08-20 DIAGNOSIS — B351 Tinea unguium: Secondary | ICD-10-CM

## 2013-08-20 DIAGNOSIS — M79609 Pain in unspecified limb: Secondary | ICD-10-CM

## 2013-08-20 NOTE — Progress Notes (Signed)
Corn medial side of left 5th toe-- toenails  HPI:  Patient presents today for follow up of foot and nail care. Denies any new complaints today. Has a painful corn on the medial side of the left 5th toenail region.   Objective:  Patients chart is reviewed.  Vascular status reveals pedal pulses noted at 2 out of 4 dp and pt bilateral .  Neurological sensation is Normal to [employer] monofilament bilateral.  Patients nails are thickened, discolored, distrophic, friable and brittle with yellow-brown discoloration. Patient subjectively relates they are painful with shoes and with ambulation of bilateral feet. Corn is present medial side of the left 5th digit.    Assessment:  Symptomatic onychomycosis  Plan:  Discussed treatment options and alternatives.  The symptomatic toenails were debrided through manual an mechanical means without complication.  Return appointment recommended at routine intervals of 3 months    Trudie Buckler, DPM

## 2013-11-19 ENCOUNTER — Ambulatory Visit: Payer: Medicare Other | Admitting: Podiatrist

## 2013-12-04 ENCOUNTER — Other Ambulatory Visit: Payer: Self-pay | Admitting: Neurosurgery

## 2013-12-04 DIAGNOSIS — M5416 Radiculopathy, lumbar region: Secondary | ICD-10-CM

## 2013-12-08 ENCOUNTER — Other Ambulatory Visit: Payer: PRIVATE HEALTH INSURANCE

## 2013-12-09 ENCOUNTER — Ambulatory Visit
Admission: RE | Admit: 2013-12-09 | Discharge: 2013-12-09 | Disposition: A | Discharge status: Home / Self Care | Payer: PRIVATE HEALTH INSURANCE | Source: Ambulatory Visit | Attending: Neurosurgery | Admitting: Neurosurgery

## 2013-12-09 DIAGNOSIS — M5416 Radiculopathy, lumbar region: Secondary | ICD-10-CM

## 2013-12-09 MED ORDER — GADOBENATE DIMEGLUMINE 529 MG/ML IV SOLN
17.0000 mL | Freq: Once | INTRAVENOUS | Status: AC | PRN
  Administered 2013-12-09: 17 mL via INTRAVENOUS

## 2013-12-16 ENCOUNTER — Other Ambulatory Visit: Payer: PRIVATE HEALTH INSURANCE

## 2013-12-25 ENCOUNTER — Encounter: Payer: Self-pay | Admitting: Podiatrist

## 2013-12-25 ENCOUNTER — Ambulatory Visit (INDEPENDENT_AMBULATORY_CARE_PROVIDER_SITE_OTHER): Payer: Medicare Other | Admitting: Podiatrist

## 2013-12-25 DIAGNOSIS — M79609 Pain in unspecified limb: Secondary | ICD-10-CM

## 2013-12-25 DIAGNOSIS — B351 Tinea unguium: Secondary | ICD-10-CM

## 2013-12-25 DIAGNOSIS — M79676 Pain in unspecified toe(s): Principal | ICD-10-CM

## 2013-12-25 NOTE — Progress Notes (Signed)
HPI: Patient presents today for follow up of foot and nail care. Denies any new complaints today. Has a painful corn on the medial side of the left 5th toenail region.  Objective: Patients chart is reviewed. Vascular status reveals pedal pulses noted at 2 out of 4 dp and pt bilateral . Neurological sensation is Normal to [employer] monofilament bilateral. Patients nails are thickened, discolored, distrophic, friable and brittle with yellow-brown discoloration. Patient subjectively relates they are painful with shoes and with ambulation of bilateral feet. Corn is present medial side of the left 5th digit.  Assessment: Symptomatic onychomycosis  Plan: Discussed treatment options and alternatives. The symptomatic toenails were debrided through manual an mechanical means without complication. Return appointment recommended at routine intervals of 3 months

## 2014-02-04 ENCOUNTER — Telehealth: Payer: Self-pay | Admitting: Cardiovascular Disease

## 2014-02-04 NOTE — Telephone Encounter (Signed)
Received records from [facility] (Dr Ashby Dawes) for appointment with Dr [name] on 02/17/14.  Records given to N. Hines (medical records) for Dr Kennon Holter schedule on 02/17/14.

## 2014-02-08 NOTE — Telephone Encounter (Signed)
Closed encounter °

## 2014-02-17 ENCOUNTER — Encounter (HOSPITAL_COMMUNITY): Payer: Self-pay | Admitting: *Deleted

## 2014-02-17 ENCOUNTER — Encounter: Payer: Self-pay | Admitting: Cardiovascular Disease

## 2014-02-17 ENCOUNTER — Ambulatory Visit (INDEPENDENT_AMBULATORY_CARE_PROVIDER_SITE_OTHER): Payer: PRIVATE HEALTH INSURANCE | Admitting: Cardiovascular Disease

## 2014-02-17 VITALS — BP 178/68 | HR 97 | Ht 63.0 in | Wt 178.1 lb

## 2014-02-17 DIAGNOSIS — R600 Localized edema: Secondary | ICD-10-CM

## 2014-02-17 DIAGNOSIS — I10 Essential (primary) hypertension: Secondary | ICD-10-CM | POA: Insufficient documentation

## 2014-02-17 DIAGNOSIS — R0989 Other specified symptoms and signs involving the circulatory and respiratory systems: Secondary | ICD-10-CM

## 2014-02-17 DIAGNOSIS — Z01818 Encounter for other preprocedural examination: Secondary | ICD-10-CM

## 2014-02-17 DIAGNOSIS — E785 Hyperlipidemia, unspecified: Secondary | ICD-10-CM

## 2014-02-17 NOTE — Patient Instructions (Addendum)
[name] has ordered:  Carotid Doppler- This test is an ultrasound of the carotid arteries in your neck. It looks at blood flow through these arteries that supply the brain with blood. Allow one hour for this exam. There are no restrictions or special instructions.  Lexiscan Myoview- this is a test that looks at the blood flow to your heart muscle.  It takes approximately 2 1/2 hours. Please follow instruction sheet, as given.  Please follow up with [name] AS NEEDED.

## 2014-02-17 NOTE — Progress Notes (Signed)
02/17/2014 Ann Fletcher   [birth year]  [id number]  Primary Physician [name], MD Primary Cardiologist: Lorretta Harp MD Renae Gloss   HPI:  Ann Fletcher is a 80 year old moderately overweight married Caucasian female whose husband is also a patient of mine. She is a mother of 3 and grandmother and 5 grandchildren. She was referred by Dr. Ashby Dawes  and for preoperative clearance before elective back surgery. Her cardiac risk factor profile is notable for treated hypertension, diabetes and hyperlipidemia. She does have bilateral looks to me edema. There is no family history of heart disease. She's never had a heart attack or stroke. She denies chest pain or shortness of breath. She apparently has back issues which may require surgery and was referred here for cardiovascular surgical clearance.   Current Outpatient Prescriptions  Medication Sig Dispense Refill  . albuterol (PROVENTIL HFA;VENTOLIN HFA) 108 (90 BASE) MCG/ACT inhaler Inhale 2 puffs into the lungs 3 (three) times daily as needed for wheezing.    [name] aspirin 81 MG tablet Take 81 mg by mouth daily.    [name] atorvastatin (LIPITOR) 40 MG tablet Take 40 mg by mouth daily.    . calcium-vitamin D (OSCAL WITH D) 500-200 MG-UNIT per tablet Take 1 tablet by mouth.    . cholecalciferol (VITAMIN D) 400 UNITS TABS tablet Take 400 Units by mouth 2 (two) times daily.    . ciprofloxacin (CIPRO) 500 MG tablet Take 500 mg by mouth 2 (two) times daily.     . furosemide (LASIX) 40 MG tablet Take 40 mg by mouth daily.    [name] glimepiride (AMARYL) 4 MG tablet Take 4 mg by mouth 2 (two) times daily with a meal.    . HYDROcodone-acetaminophen (NORCO/VICODIN) 5-325 MG per tablet     . levothyroxine (SYNTHROID, LEVOTHROID) 75 MCG tablet Take 75 mcg by mouth daily before breakfast.    . LORazepam (ATIVAN) 1 MG tablet Take 1 mg by mouth every 8 (eight) hours as needed for anxiety.    . meloxicam (MOBIC) 7.5 MG tablet Take 7.5 mg by  mouth daily.    . metoprolol succinate (TOPROL-XL) 25 MG 24 hr tablet Take 25 mg by mouth daily.    . pioglitazone (ACTOS) 30 MG tablet Take 30 mg by mouth daily.    . vitamin E 400 UNIT capsule Take 400 Units by mouth daily.     No current facility-administered medications for this visit.    Allergies  Allergen Reactions  . Nsaids Other (See Comments)    Stomach cramps  . Betadine [Povidone Iodine] Itching    History   Social History  . Marital Status: Married    Spouse Name: N/A    Number of Children: N/A  . Years of Education: N/A   Occupational History  . Not on file.   Social History Main Topics  . Smoking status: Never Smoker   . Smokeless tobacco: Not on file  . Alcohol Use: No  . Drug Use: No  . Sexual Activity: Yes    Birth Control/ Protection: Surgical   Other Topics Concern  . Not on file   Social History Narrative     Review of Systems: General: negative for chills, fever, night sweats or weight changes.  Cardiovascular: negative for chest pain, dyspnea on exertion, edema, orthopnea, palpitations, paroxysmal nocturnal dyspnea or shortness of breath Dermatological: negative for rash Respiratory: negative for cough or wheezing Urologic: negative for hematuria Abdominal: negative for nausea, vomiting, diarrhea, bright red blood per  rectum, melena, or hematemesis Neurologic: negative for visual changes, syncope, or dizziness All other systems reviewed and are otherwise negative except as noted above.    Blood pressure 178/68, pulse 97, height 5\' 3"  (1.6 m), weight 178 lb 1.6 oz (80.786 kg).  General appearance: alert Neck: no adenopathy, no carotid bruit, no JVD, supple, symmetrical, trachea midline and thyroid not enlarged, symmetric, no tenderness/mass/nodules Lungs: clear to auscultation bilaterally Heart: regular rate and rhythm, S1, S2 normal, no murmur, click, rub or gallop Extremities: venous stasis dermatitis noted2+ pitting edema  EKG normal  sinus rhythm at 97 with nonspecific ST and T-wave changes. I personally reviewed this EKG  ASSESSMENT AND PLAN:   Essential hypertension History of hypertension with blood pressure measured today at 178/68. She is on metoprolol. This is followed by her primary care physician.  Hyperlipidemia On atorvastatin 40 mg, followed by her primary care physician  Lower extremity edema On furosemide 40 mg a day. This may also be related to her Actos.  Right carotid bruit We'll obtain a carotid Doppler study to evaluate      Lorretta Harp MD [facility] 02/17/2014 12:47 PM

## 2014-02-17 NOTE — Assessment & Plan Note (Signed)
History of hypertension with blood pressure measured today at 178/68. She is on metoprolol. This is followed by her primary care physician.

## 2014-02-17 NOTE — Assessment & Plan Note (Signed)
We'll obtain a carotid Doppler study to evaluate

## 2014-02-17 NOTE — Assessment & Plan Note (Signed)
On furosemide 40 mg a day. This may also be related to her Actos.

## 2014-02-17 NOTE — Assessment & Plan Note (Signed)
On atorvastatin 40 mg, followed by her primary care physician

## 2014-03-02 ENCOUNTER — Encounter (HOSPITAL_COMMUNITY): Payer: PRIVATE HEALTH INSURANCE

## 2014-03-03 ENCOUNTER — Ambulatory Visit (HOSPITAL_COMMUNITY)
Admission: RE | Admit: 2014-03-03 | Discharge: 2014-03-03 | Disposition: A | Discharge status: Home / Self Care | Payer: PRIVATE HEALTH INSURANCE | Source: Ambulatory Visit | Attending: Cardiovascular Disease | Admitting: Cardiovascular Disease

## 2014-03-03 ENCOUNTER — Encounter (HOSPITAL_COMMUNITY): Payer: PRIVATE HEALTH INSURANCE

## 2014-03-03 DIAGNOSIS — Z01810 Encounter for preprocedural cardiovascular examination: Secondary | ICD-10-CM | POA: Diagnosis not present

## 2014-03-03 DIAGNOSIS — R0989 Other specified symptoms and signs involving the circulatory and respiratory systems: Secondary | ICD-10-CM | POA: Insufficient documentation

## 2014-03-03 NOTE — Progress Notes (Signed)
Carotid Duplex Completed. °Brianna L Mazza,RVT °

## 2014-03-05 ENCOUNTER — Telehealth (HOSPITAL_COMMUNITY): Payer: Self-pay

## 2014-03-05 NOTE — Telephone Encounter (Signed)
Encounter complete. 

## 2014-03-09 ENCOUNTER — Encounter: Payer: Self-pay | Admitting: *Deleted

## 2014-03-10 ENCOUNTER — Ambulatory Visit (HOSPITAL_COMMUNITY)
Admission: RE | Admit: 2014-03-10 | Discharge: 2014-03-10 | Disposition: A | Discharge status: Home / Self Care | Payer: PRIVATE HEALTH INSURANCE | Source: Ambulatory Visit | Attending: Cardiovascular Disease | Admitting: Cardiovascular Disease

## 2014-03-10 ENCOUNTER — Telehealth: Payer: Self-pay | Admitting: *Deleted

## 2014-03-10 DIAGNOSIS — M25552 Pain in left hip: Secondary | ICD-10-CM | POA: Diagnosis not present

## 2014-03-10 DIAGNOSIS — Z01818 Encounter for other preprocedural examination: Secondary | ICD-10-CM

## 2014-03-10 DIAGNOSIS — E119 Type 2 diabetes mellitus without complications: Secondary | ICD-10-CM | POA: Insufficient documentation

## 2014-03-10 DIAGNOSIS — Z01810 Encounter for preprocedural cardiovascular examination: Secondary | ICD-10-CM

## 2014-03-10 DIAGNOSIS — I10 Essential (primary) hypertension: Secondary | ICD-10-CM | POA: Diagnosis not present

## 2014-03-10 DIAGNOSIS — E785 Hyperlipidemia, unspecified: Secondary | ICD-10-CM | POA: Insufficient documentation

## 2014-03-10 MED ORDER — AMINOPHYLLINE 25 MG/ML IV SOLN
100.0000 mg | Freq: Once | INTRAVENOUS | Status: AC
  Administered 2014-03-10: 100 mg via INTRAVENOUS

## 2014-03-10 MED ORDER — TECHNETIUM TC 99M SESTAMIBI GENERIC - CARDIOLITE
30.0000 | Freq: Once | INTRAVENOUS | Status: AC | PRN
  Administered 2014-03-10: 30 via INTRAVENOUS

## 2014-03-10 MED ORDER — TECHNETIUM TC 99M SESTAMIBI GENERIC - CARDIOLITE
10.0000 | Freq: Once | INTRAVENOUS | Status: AC | PRN
  Administered 2014-03-10: 10 via INTRAVENOUS

## 2014-03-10 MED ORDER — REGADENOSON 0.4 MG/5ML IV SOLN
0.4000 mg | Freq: Once | INTRAVENOUS | Status: AC
  Administered 2014-03-10: 0.4 mg via INTRAVENOUS

## 2014-03-10 NOTE — Telephone Encounter (Signed)
-----   Message from Lorretta Harp, MD sent at 03/10/2014  2:26 PM EST ----- Call and tell normal

## 2014-03-10 NOTE — Procedures (Addendum)
[facility] [address] [place] [address] [id number]  Cardiology Nuclear Med Study  Ann Fletcher is a 80 y.o. female     MRN : [id number]     DOB: 1933-01-27  Procedure Date: 03/10/2014  Nuclear Med Background Indication for Stress Test:  Surgical Clearance History:  No prior cardiac history reported;No prior NUC MPI for comparison;Pt currently uses rescue inhaler but is unable to report reasoning for use. Cardiac Risk Factors: Family History - CAD, Hypertension, Lipids, NIDDM, Obesity and LEE;carotid bruit  Symptoms:  DOE and Fatigue   Nuclear Pre-Procedure Caffeine/Decaff Intake:  9:00pm NPO After: 7:00am   IV Site: R Forearm  IV 0.9% NS with Angio Cath:  22g  Chest Size (in):  n/a IV Started by: Rolene Course, RN  Height: 5\' 3"  (1.6 m)  Cup Size: A  BMI:  Body mass index is 31.54 kg/(m^2). Weight:  178 lb (80.74 kg)   Tech Comments:  n/a    Nuclear Med Study 1 or 2 day study: 1 day  Stress Test Type:  [place] Provider:  Quay Burow, MD   Resting Radionuclide: Technetium 27m Sestamibi  Resting Radionuclide Dose: 10.8 mCi   Stress Radionuclide:  Technetium 38m Sestamibi  Stress Radionuclide Dose: 30.3 mCi           Stress Protocol Rest HR: 93 Stress HR:117  Rest BP: 180/69 Stress BP: 188/74  Exercise Time (min): n/a METS: n/a          Dose of Adenosine (mg):  n/a Dose of Lexiscan: 0.4 mg  Dose of Atropine (mg): n/a Dose of Dobutamine: n/a mcg/kg/min (at max HR)  Stress Test Technologist: Mellody Memos, CCT Nuclear Technologist: Otho Perl, CNMT   Rest Procedure:  Myocardial perfusion imaging was performed at rest 45 minutes following the intravenous administration of Technetium 4m Sestamibi. Stress Procedure:  The patient received IV Lexiscan 0.4 mg over 15-seconds.  Technetium 27m Sestamibi injected IV at 30-seconds.  Patient experienced shortness of breath and was  administered 100 mg of Aminophylline IV. There were no significant changes with Lexiscan.  Quantitative spect images were obtained after a 45 minute delay.  Transient Ischemic Dilatation (Normal <1.22):  1.14 QGS EDV:  71 ml QGS ESV:  17 ml LV Ejection Fraction: 77%      Rest ECG: NSR - Normal EKG  Stress ECG: Insignificant upsloping ST segment depression.  QPS Raw Data Images:  Normal; no motion artifact; normal heart/lung ratio. Stress Images:  Normal homogeneous uptake in all areas of the myocardium. Rest Images:  Normal homogeneous uptake in all areas of the myocardium. Subtraction (SDS):  No evidence of ischemia. LV Wall Motion:  NL LV Function; NL Wall Motion  Impression Exercise Capacity:  Lexiscan with no exercise. BP Response:  Normal blood pressure response. Clinical Symptoms:  No significant symptoms noted. ECG Impression:  Insignificant upsloping ST segment depression. Comparison with Prior Nuclear Study: No previous nuclear study performed   Overall Impression:  Normal stress nuclear study.   Sanda Klein, MD  03/10/2014 12:18 PM

## 2014-03-11 ENCOUNTER — Encounter: Payer: Self-pay | Admitting: *Deleted

## 2014-03-11 NOTE — Telephone Encounter (Signed)
Cleared for back surgery at low risk  JJB

## 2014-03-17 NOTE — Telephone Encounter (Signed)
Clearance letter faxed to Dr. Newman Pies

## 2014-04-20 DIAGNOSIS — I10 Essential (primary) hypertension: Secondary | ICD-10-CM | POA: Diagnosis not present

## 2014-04-20 DIAGNOSIS — E118 Type 2 diabetes mellitus with unspecified complications: Secondary | ICD-10-CM | POA: Diagnosis not present

## 2014-04-20 DIAGNOSIS — E782 Mixed hyperlipidemia: Secondary | ICD-10-CM | POA: Diagnosis not present

## 2014-04-22 ENCOUNTER — Ambulatory Visit: Payer: Medicare Other | Admitting: Podiatrist

## 2014-04-22 DIAGNOSIS — E11329 Type 2 diabetes mellitus with mild nonproliferative diabetic retinopathy without macular edema: Secondary | ICD-10-CM | POA: Diagnosis not present

## 2014-04-22 DIAGNOSIS — H25013 Cortical age-related cataract, bilateral: Secondary | ICD-10-CM | POA: Diagnosis not present

## 2014-04-22 DIAGNOSIS — H35372 Puckering of macula, left eye: Secondary | ICD-10-CM | POA: Diagnosis not present

## 2014-04-22 DIAGNOSIS — H2513 Age-related nuclear cataract, bilateral: Secondary | ICD-10-CM | POA: Diagnosis not present

## 2014-04-23 ENCOUNTER — Ambulatory Visit: Payer: Medicare Other | Admitting: Podiatrist

## 2014-04-27 DIAGNOSIS — E118 Type 2 diabetes mellitus with unspecified complications: Secondary | ICD-10-CM | POA: Diagnosis not present

## 2014-04-27 DIAGNOSIS — E782 Mixed hyperlipidemia: Secondary | ICD-10-CM | POA: Diagnosis not present

## 2014-04-27 DIAGNOSIS — I10 Essential (primary) hypertension: Secondary | ICD-10-CM | POA: Diagnosis not present

## 2014-05-12 DIAGNOSIS — E11329 Type 2 diabetes mellitus with mild nonproliferative diabetic retinopathy without macular edema: Secondary | ICD-10-CM | POA: Diagnosis not present

## 2014-05-12 DIAGNOSIS — H1851 Endothelial corneal dystrophy: Secondary | ICD-10-CM | POA: Diagnosis not present

## 2014-05-12 DIAGNOSIS — H25811 Combined forms of age-related cataract, right eye: Secondary | ICD-10-CM | POA: Diagnosis not present

## 2014-05-12 DIAGNOSIS — H2511 Age-related nuclear cataract, right eye: Secondary | ICD-10-CM | POA: Diagnosis not present

## 2014-05-12 DIAGNOSIS — H25011 Cortical age-related cataract, right eye: Secondary | ICD-10-CM | POA: Diagnosis not present

## 2014-05-24 DIAGNOSIS — I10 Essential (primary) hypertension: Secondary | ICD-10-CM | POA: Diagnosis not present

## 2014-05-25 DIAGNOSIS — I10 Essential (primary) hypertension: Secondary | ICD-10-CM | POA: Diagnosis not present

## 2014-05-26 DIAGNOSIS — I10 Essential (primary) hypertension: Secondary | ICD-10-CM | POA: Diagnosis not present

## 2014-05-27 DIAGNOSIS — I10 Essential (primary) hypertension: Secondary | ICD-10-CM | POA: Diagnosis not present

## 2014-05-28 DIAGNOSIS — I10 Essential (primary) hypertension: Secondary | ICD-10-CM | POA: Diagnosis not present

## 2014-05-31 DIAGNOSIS — I10 Essential (primary) hypertension: Secondary | ICD-10-CM | POA: Diagnosis not present

## 2014-06-01 DIAGNOSIS — I10 Essential (primary) hypertension: Secondary | ICD-10-CM | POA: Diagnosis not present

## 2014-06-02 DIAGNOSIS — I10 Essential (primary) hypertension: Secondary | ICD-10-CM | POA: Diagnosis not present

## 2014-06-04 DIAGNOSIS — I10 Essential (primary) hypertension: Secondary | ICD-10-CM | POA: Diagnosis not present

## 2014-06-07 DIAGNOSIS — I10 Essential (primary) hypertension: Secondary | ICD-10-CM | POA: Diagnosis not present

## 2014-06-08 DIAGNOSIS — I10 Essential (primary) hypertension: Secondary | ICD-10-CM | POA: Diagnosis not present

## 2014-06-09 DIAGNOSIS — H25012 Cortical age-related cataract, left eye: Secondary | ICD-10-CM | POA: Diagnosis not present

## 2014-06-09 DIAGNOSIS — I10 Essential (primary) hypertension: Secondary | ICD-10-CM | POA: Diagnosis not present

## 2014-06-09 DIAGNOSIS — H2512 Age-related nuclear cataract, left eye: Secondary | ICD-10-CM | POA: Diagnosis not present

## 2014-06-09 DIAGNOSIS — H25812 Combined forms of age-related cataract, left eye: Secondary | ICD-10-CM | POA: Diagnosis not present

## 2014-06-09 DIAGNOSIS — E11319 Type 2 diabetes mellitus with unspecified diabetic retinopathy without macular edema: Secondary | ICD-10-CM | POA: Diagnosis not present

## 2014-06-09 DIAGNOSIS — H1851 Endothelial corneal dystrophy: Secondary | ICD-10-CM | POA: Diagnosis not present

## 2014-06-10 DIAGNOSIS — I10 Essential (primary) hypertension: Secondary | ICD-10-CM | POA: Diagnosis not present

## 2014-06-11 DIAGNOSIS — I10 Essential (primary) hypertension: Secondary | ICD-10-CM | POA: Diagnosis not present

## 2014-06-14 DIAGNOSIS — I10 Essential (primary) hypertension: Secondary | ICD-10-CM | POA: Diagnosis not present

## 2014-06-15 DIAGNOSIS — I10 Essential (primary) hypertension: Secondary | ICD-10-CM | POA: Diagnosis not present

## 2014-06-17 DIAGNOSIS — I10 Essential (primary) hypertension: Secondary | ICD-10-CM | POA: Diagnosis not present

## 2014-06-18 DIAGNOSIS — I10 Essential (primary) hypertension: Secondary | ICD-10-CM | POA: Diagnosis not present

## 2014-06-21 DIAGNOSIS — I10 Essential (primary) hypertension: Secondary | ICD-10-CM | POA: Diagnosis not present

## 2014-06-22 DIAGNOSIS — I10 Essential (primary) hypertension: Secondary | ICD-10-CM | POA: Diagnosis not present

## 2014-06-23 DIAGNOSIS — I10 Essential (primary) hypertension: Secondary | ICD-10-CM | POA: Diagnosis not present

## 2014-06-24 ENCOUNTER — Encounter: Payer: Self-pay | Admitting: Podiatrist

## 2014-06-24 ENCOUNTER — Ambulatory Visit (INDEPENDENT_AMBULATORY_CARE_PROVIDER_SITE_OTHER): Payer: Medicare Other | Admitting: Podiatrist

## 2014-06-24 DIAGNOSIS — B351 Tinea unguium: Secondary | ICD-10-CM | POA: Diagnosis not present

## 2014-06-24 DIAGNOSIS — I10 Essential (primary) hypertension: Secondary | ICD-10-CM | POA: Diagnosis not present

## 2014-06-24 DIAGNOSIS — M79676 Pain in unspecified toe(s): Secondary | ICD-10-CM

## 2014-06-25 DIAGNOSIS — I10 Essential (primary) hypertension: Secondary | ICD-10-CM | POA: Diagnosis not present

## 2014-06-28 DIAGNOSIS — I10 Essential (primary) hypertension: Secondary | ICD-10-CM | POA: Diagnosis not present

## 2014-06-29 DIAGNOSIS — I10 Essential (primary) hypertension: Secondary | ICD-10-CM | POA: Diagnosis not present

## 2014-06-30 DIAGNOSIS — I10 Essential (primary) hypertension: Secondary | ICD-10-CM | POA: Diagnosis not present

## 2014-07-01 DIAGNOSIS — I10 Essential (primary) hypertension: Secondary | ICD-10-CM | POA: Diagnosis not present

## 2014-07-02 DIAGNOSIS — I10 Essential (primary) hypertension: Secondary | ICD-10-CM | POA: Diagnosis not present

## 2014-07-03 NOTE — Progress Notes (Signed)
HPI: Patient presents today for follow up of foot and nail care. Denies any new complaints today. Has a painful corn on the medial side of the left 5th toenail region.  Objective: Patients chart is reviewed. Vascular status reveals pedal pulses noted at 2 out of 4 dp and pt bilateral . Neurological sensation is Normal to [employer] monofilament bilateral. Patients nails are thickened, discolored, distrophic, friable and brittle with yellow-brown discoloration. Patient subjectively relates they are painful with shoes and with ambulation of bilateral feet. Corn is present medial side of the left 5th digit.  Assessment: Symptomatic onychomycosis  Plan: Discussed treatment options and alternatives. The symptomatic toenails were debrided through manual an mechanical means without complication. Return appointment recommended at routine intervals of 3 months

## 2014-07-06 DIAGNOSIS — I10 Essential (primary) hypertension: Secondary | ICD-10-CM | POA: Diagnosis not present

## 2014-07-07 DIAGNOSIS — I10 Essential (primary) hypertension: Secondary | ICD-10-CM | POA: Diagnosis not present

## 2014-07-08 DIAGNOSIS — I10 Essential (primary) hypertension: Secondary | ICD-10-CM | POA: Diagnosis not present

## 2014-07-09 DIAGNOSIS — I10 Essential (primary) hypertension: Secondary | ICD-10-CM | POA: Diagnosis not present

## 2014-07-12 DIAGNOSIS — I10 Essential (primary) hypertension: Secondary | ICD-10-CM | POA: Diagnosis not present

## 2014-07-13 DIAGNOSIS — I10 Essential (primary) hypertension: Secondary | ICD-10-CM | POA: Diagnosis not present

## 2014-07-14 DIAGNOSIS — I10 Essential (primary) hypertension: Secondary | ICD-10-CM | POA: Diagnosis not present

## 2014-07-15 DIAGNOSIS — I10 Essential (primary) hypertension: Secondary | ICD-10-CM | POA: Diagnosis not present

## 2014-07-16 DIAGNOSIS — I10 Essential (primary) hypertension: Secondary | ICD-10-CM | POA: Diagnosis not present

## 2014-07-19 DIAGNOSIS — I10 Essential (primary) hypertension: Secondary | ICD-10-CM | POA: Diagnosis not present

## 2014-07-20 DIAGNOSIS — I10 Essential (primary) hypertension: Secondary | ICD-10-CM | POA: Diagnosis not present

## 2014-07-21 ENCOUNTER — Ambulatory Visit: Payer: Medicaid Other | Admitting: Podiatrist

## 2014-07-21 DIAGNOSIS — I10 Essential (primary) hypertension: Secondary | ICD-10-CM | POA: Diagnosis not present

## 2014-07-22 DIAGNOSIS — I10 Essential (primary) hypertension: Secondary | ICD-10-CM | POA: Diagnosis not present

## 2014-07-23 DIAGNOSIS — I10 Essential (primary) hypertension: Secondary | ICD-10-CM | POA: Diagnosis not present

## 2014-07-26 DIAGNOSIS — I10 Essential (primary) hypertension: Secondary | ICD-10-CM | POA: Diagnosis not present

## 2014-07-27 DIAGNOSIS — I10 Essential (primary) hypertension: Secondary | ICD-10-CM | POA: Diagnosis not present

## 2014-07-28 DIAGNOSIS — I10 Essential (primary) hypertension: Secondary | ICD-10-CM | POA: Diagnosis not present

## 2014-07-29 DIAGNOSIS — I10 Essential (primary) hypertension: Secondary | ICD-10-CM | POA: Diagnosis not present

## 2014-07-29 DIAGNOSIS — Z96651 Presence of right artificial knee joint: Secondary | ICD-10-CM | POA: Diagnosis not present

## 2014-07-30 DIAGNOSIS — I10 Essential (primary) hypertension: Secondary | ICD-10-CM | POA: Diagnosis not present

## 2014-08-02 DIAGNOSIS — I10 Essential (primary) hypertension: Secondary | ICD-10-CM | POA: Diagnosis not present

## 2014-08-03 DIAGNOSIS — I10 Essential (primary) hypertension: Secondary | ICD-10-CM | POA: Diagnosis not present

## 2014-08-04 DIAGNOSIS — I10 Essential (primary) hypertension: Secondary | ICD-10-CM | POA: Diagnosis not present

## 2014-08-05 DIAGNOSIS — I10 Essential (primary) hypertension: Secondary | ICD-10-CM | POA: Diagnosis not present

## 2014-08-09 DIAGNOSIS — I10 Essential (primary) hypertension: Secondary | ICD-10-CM | POA: Diagnosis not present

## 2014-08-10 DIAGNOSIS — I10 Essential (primary) hypertension: Secondary | ICD-10-CM | POA: Diagnosis not present

## 2014-08-11 DIAGNOSIS — I10 Essential (primary) hypertension: Secondary | ICD-10-CM | POA: Diagnosis not present

## 2014-08-12 DIAGNOSIS — I10 Essential (primary) hypertension: Secondary | ICD-10-CM | POA: Diagnosis not present

## 2014-08-13 DIAGNOSIS — I10 Essential (primary) hypertension: Secondary | ICD-10-CM | POA: Diagnosis not present

## 2014-08-16 DIAGNOSIS — I10 Essential (primary) hypertension: Secondary | ICD-10-CM | POA: Diagnosis not present

## 2014-08-17 DIAGNOSIS — I10 Essential (primary) hypertension: Secondary | ICD-10-CM | POA: Diagnosis not present

## 2014-08-18 DIAGNOSIS — I10 Essential (primary) hypertension: Secondary | ICD-10-CM | POA: Diagnosis not present

## 2014-08-19 DIAGNOSIS — Z1231 Encounter for screening mammogram for malignant neoplasm of breast: Secondary | ICD-10-CM | POA: Diagnosis not present

## 2014-08-19 DIAGNOSIS — I10 Essential (primary) hypertension: Secondary | ICD-10-CM | POA: Diagnosis not present

## 2014-08-19 DIAGNOSIS — H6122 Impacted cerumen, left ear: Secondary | ICD-10-CM | POA: Diagnosis not present

## 2014-08-20 DIAGNOSIS — I10 Essential (primary) hypertension: Secondary | ICD-10-CM | POA: Diagnosis not present

## 2014-08-20 DIAGNOSIS — E118 Type 2 diabetes mellitus with unspecified complications: Secondary | ICD-10-CM | POA: Diagnosis not present

## 2014-08-20 DIAGNOSIS — E782 Mixed hyperlipidemia: Secondary | ICD-10-CM | POA: Diagnosis not present

## 2014-08-24 DIAGNOSIS — I10 Essential (primary) hypertension: Secondary | ICD-10-CM | POA: Diagnosis not present

## 2014-08-25 DIAGNOSIS — I10 Essential (primary) hypertension: Secondary | ICD-10-CM | POA: Diagnosis not present

## 2014-08-26 DIAGNOSIS — I10 Essential (primary) hypertension: Secondary | ICD-10-CM | POA: Diagnosis not present

## 2014-08-27 DIAGNOSIS — E039 Hypothyroidism, unspecified: Secondary | ICD-10-CM | POA: Diagnosis not present

## 2014-08-27 DIAGNOSIS — E782 Mixed hyperlipidemia: Secondary | ICD-10-CM | POA: Diagnosis not present

## 2014-08-27 DIAGNOSIS — E1165 Type 2 diabetes mellitus with hyperglycemia: Secondary | ICD-10-CM | POA: Diagnosis not present

## 2014-08-27 DIAGNOSIS — I10 Essential (primary) hypertension: Secondary | ICD-10-CM | POA: Diagnosis not present

## 2014-08-30 DIAGNOSIS — I10 Essential (primary) hypertension: Secondary | ICD-10-CM | POA: Diagnosis not present

## 2014-08-31 DIAGNOSIS — I10 Essential (primary) hypertension: Secondary | ICD-10-CM | POA: Diagnosis not present

## 2014-09-01 DIAGNOSIS — I10 Essential (primary) hypertension: Secondary | ICD-10-CM | POA: Diagnosis not present

## 2014-09-02 DIAGNOSIS — I10 Essential (primary) hypertension: Secondary | ICD-10-CM | POA: Diagnosis not present

## 2014-09-03 DIAGNOSIS — I10 Essential (primary) hypertension: Secondary | ICD-10-CM | POA: Diagnosis not present

## 2014-09-07 DIAGNOSIS — I10 Essential (primary) hypertension: Secondary | ICD-10-CM | POA: Diagnosis not present

## 2014-09-08 DIAGNOSIS — I10 Essential (primary) hypertension: Secondary | ICD-10-CM | POA: Diagnosis not present

## 2014-09-09 DIAGNOSIS — I10 Essential (primary) hypertension: Secondary | ICD-10-CM | POA: Diagnosis not present

## 2014-09-10 DIAGNOSIS — I10 Essential (primary) hypertension: Secondary | ICD-10-CM | POA: Diagnosis not present

## 2014-09-13 DIAGNOSIS — I10 Essential (primary) hypertension: Secondary | ICD-10-CM | POA: Diagnosis not present

## 2014-09-14 DIAGNOSIS — I10 Essential (primary) hypertension: Secondary | ICD-10-CM | POA: Diagnosis not present

## 2014-09-15 DIAGNOSIS — I10 Essential (primary) hypertension: Secondary | ICD-10-CM | POA: Diagnosis not present

## 2014-09-16 DIAGNOSIS — I10 Essential (primary) hypertension: Secondary | ICD-10-CM | POA: Diagnosis not present

## 2014-09-17 DIAGNOSIS — I10 Essential (primary) hypertension: Secondary | ICD-10-CM | POA: Diagnosis not present

## 2014-09-29 ENCOUNTER — Ambulatory Visit: Payer: Medicare Other | Admitting: Podiatry

## 2014-09-29 DIAGNOSIS — I4891 Unspecified atrial fibrillation: Secondary | ICD-10-CM | POA: Diagnosis not present

## 2014-09-30 DIAGNOSIS — I4891 Unspecified atrial fibrillation: Secondary | ICD-10-CM | POA: Diagnosis not present

## 2014-10-01 DIAGNOSIS — I4891 Unspecified atrial fibrillation: Secondary | ICD-10-CM | POA: Diagnosis not present

## 2014-10-05 ENCOUNTER — Telehealth: Payer: Self-pay | Admitting: Cardiovascular Disease

## 2014-10-05 NOTE — Telephone Encounter (Signed)
Received records from [facility]) for appointment on 10/08/14 with Dr [name].  Records given to [facility] (medical records) for Dr Kennon Holter schedule on 10/08/14. lp

## 2014-10-07 ENCOUNTER — Encounter (HOSPITAL_COMMUNITY): Payer: Self-pay | Admitting: Emergency Medicine

## 2014-10-07 ENCOUNTER — Emergency Department (HOSPITAL_COMMUNITY): Payer: Medicare Other

## 2014-10-07 ENCOUNTER — Inpatient Hospital Stay (HOSPITAL_COMMUNITY): Payer: Medicare Other

## 2014-10-07 ENCOUNTER — Inpatient Hospital Stay (HOSPITAL_COMMUNITY)
Admission: EM | Admit: 2014-10-07 | Discharge: 2014-10-15 | DRG: 308 | Disposition: A | Discharge status: Home / Self Care | Payer: Medicare Other | Attending: Cardiology | Admitting: Cardiology

## 2014-10-07 DIAGNOSIS — I4891 Unspecified atrial fibrillation: Secondary | ICD-10-CM | POA: Diagnosis not present

## 2014-10-07 DIAGNOSIS — E785 Hyperlipidemia, unspecified: Secondary | ICD-10-CM | POA: Diagnosis not present

## 2014-10-07 DIAGNOSIS — M810 Age-related osteoporosis without current pathological fracture: Secondary | ICD-10-CM | POA: Diagnosis not present

## 2014-10-07 DIAGNOSIS — I5033 Acute on chronic diastolic (congestive) heart failure: Secondary | ICD-10-CM | POA: Diagnosis present

## 2014-10-07 DIAGNOSIS — I480 Paroxysmal atrial fibrillation: Secondary | ICD-10-CM | POA: Diagnosis not present

## 2014-10-07 DIAGNOSIS — Z96651 Presence of right artificial knee joint: Secondary | ICD-10-CM | POA: Diagnosis not present

## 2014-10-07 DIAGNOSIS — I313 Pericardial effusion (noninflammatory): Secondary | ICD-10-CM | POA: Diagnosis not present

## 2014-10-07 DIAGNOSIS — R069 Unspecified abnormalities of breathing: Secondary | ICD-10-CM | POA: Diagnosis not present

## 2014-10-07 DIAGNOSIS — I509 Heart failure, unspecified: Secondary | ICD-10-CM

## 2014-10-07 DIAGNOSIS — Z7982 Long term (current) use of aspirin: Secondary | ICD-10-CM

## 2014-10-07 DIAGNOSIS — I5031 Acute diastolic (congestive) heart failure: Secondary | ICD-10-CM | POA: Diagnosis not present

## 2014-10-07 DIAGNOSIS — I340 Nonrheumatic mitral (valve) insufficiency: Secondary | ICD-10-CM | POA: Diagnosis not present

## 2014-10-07 DIAGNOSIS — I10 Essential (primary) hypertension: Secondary | ICD-10-CM | POA: Diagnosis present

## 2014-10-07 DIAGNOSIS — I481 Persistent atrial fibrillation: Secondary | ICD-10-CM | POA: Diagnosis present

## 2014-10-07 DIAGNOSIS — Z7901 Long term (current) use of anticoagulants: Secondary | ICD-10-CM | POA: Diagnosis not present

## 2014-10-07 DIAGNOSIS — I5032 Chronic diastolic (congestive) heart failure: Secondary | ICD-10-CM | POA: Diagnosis not present

## 2014-10-07 DIAGNOSIS — Z791 Long term (current) use of non-steroidal anti-inflammatories (NSAID): Secondary | ICD-10-CM | POA: Diagnosis not present

## 2014-10-07 DIAGNOSIS — E119 Type 2 diabetes mellitus without complications: Secondary | ICD-10-CM | POA: Diagnosis present

## 2014-10-07 DIAGNOSIS — E039 Hypothyroidism, unspecified: Secondary | ICD-10-CM | POA: Diagnosis not present

## 2014-10-07 DIAGNOSIS — R600 Localized edema: Secondary | ICD-10-CM | POA: Diagnosis present

## 2014-10-07 DIAGNOSIS — R0602 Shortness of breath: Secondary | ICD-10-CM | POA: Diagnosis not present

## 2014-10-07 HISTORY — DX: Personal history of urinary (tract) infections: Z87.440

## 2014-10-07 HISTORY — DX: Anxiety disorder, unspecified: F41.9

## 2014-10-07 LAB — CBC WITH DIFFERENTIAL/PLATELET
Basophils Absolute: 0.1 10*3/uL (ref 0.0–0.1)
Basophils Relative: 1 % (ref 0–1)
Eosinophils Absolute: 0.1 10*3/uL (ref 0.0–0.7)
Eosinophils Relative: 1 % (ref 0–5)
HCT: 35.1 % — ABNORMAL LOW (ref 36.0–46.0)
Hemoglobin: 11 g/dL — ABNORMAL LOW (ref 12.0–15.0)
Lymphocytes Relative: 20 % (ref 12–46)
Lymphs Abs: 1.6 10*3/uL (ref 0.7–4.0)
MCH: 24.3 pg — ABNORMAL LOW (ref 26.0–34.0)
MCHC: 31.3 g/dL (ref 30.0–36.0)
MCV: 77.5 fL — ABNORMAL LOW (ref 78.0–100.0)
Monocytes Absolute: 0.7 10*3/uL (ref 0.1–1.0)
Monocytes Relative: 9 % (ref 3–12)
Neutro Abs: 5.3 10*3/uL (ref 1.7–7.7)
Neutrophils Relative %: 69 % (ref 43–77)
Platelets: 161 10*3/uL (ref 150–400)
RBC: 4.53 MIL/uL (ref 3.87–5.11)
RDW: 16.3 % — ABNORMAL HIGH (ref 11.5–15.5)
WBC: 7.7 10*3/uL (ref 4.0–10.5)

## 2014-10-07 LAB — BASIC METABOLIC PANEL
Anion gap: 11 (ref 5–15)
BUN: 23 mg/dL — ABNORMAL HIGH (ref 6–20)
CO2: 27 mmol/L (ref 22–32)
Calcium: 9.7 mg/dL (ref 8.9–10.3)
Chloride: 100 mmol/L — ABNORMAL LOW (ref 101–111)
Creatinine, Ser: 0.87 mg/dL (ref 0.44–1.00)
GFR calc Af Amer: >60 mL/min (ref >60)
GFR calc non Af Amer: >60 mL/min (ref >60)
Glucose, Bld: 165 mg/dL — ABNORMAL HIGH (ref 65–99)
Potassium: 3.7 mmol/L (ref 3.5–5.1)
Sodium: 138 mmol/L (ref 135–145)

## 2014-10-07 LAB — GLUCOSE, CAPILLARY
Glucose-Capillary: 154 mg/dL — ABNORMAL HIGH (ref 65–99)
Glucose-Capillary: 153 mg/dL — ABNORMAL HIGH (ref 65–99)

## 2014-10-07 LAB — I-STAT TROPONIN, ED: Troponin i, poc: 0.01 ng/mL (ref 0.00–0.08)

## 2014-10-07 LAB — BRAIN NATRIURETIC PEPTIDE: B Natriuretic Peptide: 365.9 pg/mL — ABNORMAL HIGH (ref 0.0–100.0)

## 2014-10-07 LAB — TROPONIN I: Troponin I: <0.03 ng/mL (ref <0.031)

## 2014-10-07 MED ORDER — ALBUTEROL SULFATE HFA 108 (90 BASE) MCG/ACT IN AERS: 2.0000 | INHALATION_SPRAY | Freq: Three times a day (TID) | RESPIRATORY_TRACT | Status: DC | PRN

## 2014-10-07 MED ORDER — DILTIAZEM HCL 100 MG IV SOLR
10.0000 mg/h | INTRAVENOUS | Status: DC
  Administered 2014-10-07: 5 mg/h via INTRAVENOUS
  Administered 2014-10-09: 10 mg/h via INTRAVENOUS
  Filled 2014-10-07 (×3): qty 100

## 2014-10-07 MED ORDER — LEVOTHYROXINE SODIUM 75 MCG PO TABS
75.0000 ug | ORAL_TABLET | Freq: Every day | ORAL | Status: DC
  Administered 2014-10-08 – 2014-10-14 (×7): 75 ug via ORAL
  Filled 2014-10-07 (×9): qty 1

## 2014-10-07 MED ORDER — ACETAMINOPHEN 325 MG PO TABS: 650.0000 mg | ORAL_TABLET | ORAL | Status: DC | PRN

## 2014-10-07 MED ORDER — FUROSEMIDE 10 MG/ML IJ SOLN
40.0000 mg | Freq: Two times a day (BID) | INTRAMUSCULAR | Status: DC
  Administered 2014-10-07 – 2014-10-10 (×6): 40 mg via INTRAVENOUS
  Filled 2014-10-07 (×8): qty 4

## 2014-10-07 MED ORDER — ONDANSETRON HCL 4 MG/2ML IJ SOLN: 4.0000 mg | Freq: Four times a day (QID) | INTRAMUSCULAR | Status: DC | PRN

## 2014-10-07 MED ORDER — DABIGATRAN ETEXILATE MESYLATE 150 MG PO CAPS
150.0000 mg | ORAL_CAPSULE | Freq: Two times a day (BID) | ORAL | Status: DC
  Administered 2014-10-07 – 2014-10-15 (×16): 150 mg via ORAL
  Filled 2014-10-07 (×17): qty 1

## 2014-10-07 MED ORDER — DILTIAZEM HCL 25 MG/5ML IV SOLN
10.0000 mg | Freq: Once | INTRAVENOUS | Status: AC
  Administered 2014-10-07: 10 mg via INTRAVENOUS
  Filled 2014-10-07: qty 5

## 2014-10-07 MED ORDER — VITAMIN E 180 MG (400 UNIT) PO CAPS
400.0000 [IU] | ORAL_CAPSULE | Freq: Every day | ORAL | Status: DC
  Administered 2014-10-08 – 2014-10-15 (×8): 400 [IU] via ORAL
  Filled 2014-10-07 (×8): qty 1

## 2014-10-07 MED ORDER — LORAZEPAM 1 MG PO TABS
1.0000 mg | ORAL_TABLET | Freq: Every day | ORAL | Status: DC
  Administered 2014-10-07 – 2014-10-14 (×8): 1 mg via ORAL
  Filled 2014-10-07 (×8): qty 1

## 2014-10-07 MED ORDER — ATORVASTATIN CALCIUM 40 MG PO TABS
40.0000 mg | ORAL_TABLET | Freq: Every day | ORAL | Status: DC
Start: 2014-10-07 — End: 2014-10-15
  Administered 2014-10-07 – 2014-10-15 (×8): 40 mg via ORAL
  Filled 2014-10-07 (×9): qty 1

## 2014-10-07 MED ORDER — DILTIAZEM HCL 100 MG IV SOLR
5.0000 mg/h | Freq: Once | INTRAVENOUS | Status: AC
  Administered 2014-10-07: 5 mg/h via INTRAVENOUS

## 2014-10-07 MED ORDER — INSULIN ASPART 100 UNIT/ML ~~LOC~~ SOLN
0.0000 [IU] | Freq: Three times a day (TID) | SUBCUTANEOUS | Status: DC
  Administered 2014-10-08: 1 [IU] via SUBCUTANEOUS
  Administered 2014-10-09: 2 [IU] via SUBCUTANEOUS
  Administered 2014-10-09 (×2): 1 [IU] via SUBCUTANEOUS
  Administered 2014-10-10: 2 [IU] via SUBCUTANEOUS
  Administered 2014-10-10 – 2014-10-11 (×2): 1 [IU] via SUBCUTANEOUS
  Administered 2014-10-11 – 2014-10-12 (×2): 2 [IU] via SUBCUTANEOUS
  Administered 2014-10-12 – 2014-10-13 (×3): 1 [IU] via SUBCUTANEOUS
  Administered 2014-10-14 – 2014-10-15 (×3): 2 [IU] via SUBCUTANEOUS

## 2014-10-07 MED ORDER — METOPROLOL SUCCINATE ER 100 MG PO TB24
100.0000 mg | ORAL_TABLET | Freq: Every day | ORAL | Status: DC
Start: 2014-10-08 — End: 2014-10-15
  Administered 2014-10-08 – 2014-10-15 (×8): 100 mg via ORAL
  Filled 2014-10-07 (×8): qty 1

## 2014-10-07 MED ORDER — GLIMEPIRIDE 4 MG PO TABS
4.0000 mg | ORAL_TABLET | Freq: Two times a day (BID) | ORAL | Status: DC
  Administered 2014-10-07 – 2014-10-12 (×9): 4 mg via ORAL
  Filled 2014-10-07 (×13): qty 1

## 2014-10-07 MED ORDER — ALBUTEROL SULFATE (2.5 MG/3ML) 0.083% IN NEBU: 2.5000 mg | INHALATION_SOLUTION | Freq: Four times a day (QID) | RESPIRATORY_TRACT | Status: DC | PRN

## 2014-10-07 MED ORDER — INSULIN ASPART 100 UNIT/ML ~~LOC~~ SOLN
0.0000 [IU] | Freq: Every day | SUBCUTANEOUS | Status: DC
  Administered 2014-10-08 – 2014-10-12 (×2): 2 [IU] via SUBCUTANEOUS

## 2014-10-07 NOTE — ED Provider Notes (Signed)
CSN: [id number]     Arrival date & time 10/07/14  1122 History   First MD Initiated Contact with Patient 10/07/14 1125     Chief Complaint  Patient presents with  . Shortness of Breath     (Consider location/radiation/quality/duration/timing/severity/associated sxs/prior Treatment) Patient is a 81 y.o. female presenting with shortness of breath. The history is provided by the patient and medical records.  Shortness of Breath   This is an 81 y.o. F with hx of diabetes, thyroid disease, arthritis, HTN, AFIB, presenting to the ED for SOB.  Patient states approx 1 week ago she was seen by PCP and diagnosed with AFIB.  States she was started on cardizem and scheduled for cardiology follow-up with her cardiologist, [name].  States she has been progressively more SOB over the past week.  States SOB is worse with exertion, some lightheadedness noted as well.  No dizziness or syncope.  No chest pain.  Patient does not use home oxygen. She denies any cough, fever, chills, or other upper respiratory symptoms.  Myocardial perfusion study December 2015 with estimated EF of 77%.  Patient followed by cardiology, [name].  Past Medical History  Diagnosis Date  . Osteoporosis   . Diabetes mellitus without complication   . Thyroid disease   . High cholesterol   . Arthritis   . Hypertension   . Lower extremity edema   . Atrial fibrillation    Past Surgical History  Procedure Laterality Date  . Back surgery  2001, 2003, 2006    x 3  . Replacement total knee Right 07/2008  . Tubal ligation     Family History  Problem Relation Age of Onset  . Pneumonia Mother   . Heart attack Father   . Diabetes Sister    History  Substance Use Topics  . Smoking status: Never Smoker   . Smokeless tobacco: Not on file  . Alcohol Use: No   OB History    No data available     Review of Systems  Respiratory: Positive for shortness of breath.   All other systems reviewed and are  negative.     Allergies  Nsaids and Betadine  Home Medications   Prior to Admission medications   Medication Sig Start Date End Date Taking? Authorizing Provider  albuterol (PROVENTIL HFA;VENTOLIN HFA) 108 (90 BASE) MCG/ACT inhaler Inhale 2 puffs into the lungs 3 (three) times daily as needed for wheezing.    Historical Provider, MD  aspirin 81 MG tablet Take 81 mg by mouth daily.    Historical Provider, MD  atorvastatin (LIPITOR) 40 MG tablet Take 40 mg by mouth daily.    Historical Provider, MD  calcium-vitamin D (OSCAL WITH D) 500-200 MG-UNIT per tablet Take 1 tablet by mouth.    Historical Provider, MD  cholecalciferol (VITAMIN D) 400 UNITS TABS tablet Take 400 Units by mouth 2 (two) times daily.    Historical Provider, MD  furosemide (LASIX) 40 MG tablet Take 40 mg by mouth daily.    Historical Provider, MD  glimepiride (AMARYL) 4 MG tablet Take 4 mg by mouth 2 (two) times daily with a meal.    Historical Provider, MD  HYDROcodone-acetaminophen (NORCO/VICODIN) 5-325 MG per tablet  01/15/14   Historical Provider, MD  levothyroxine (SYNTHROID, LEVOTHROID) 75 MCG tablet Take 75 mcg by mouth daily before breakfast.    Historical Provider, MD  LORazepam (ATIVAN) 1 MG tablet Take 1 mg by mouth every 8 (eight) hours as needed for anxiety.  Historical Provider, MD  meloxicam (MOBIC) 7.5 MG tablet Take 7.5 mg by mouth daily.    Historical Provider, MD  metoprolol succinate (TOPROL-XL) 25 MG 24 hr tablet Take 25 mg by mouth daily.    Historical Provider, MD  pioglitazone (ACTOS) 30 MG tablet Take 30 mg by mouth daily.    Historical Provider, MD  vitamin E 400 UNIT capsule Take 400 Units by mouth daily.    Historical Provider, MD   BP 139/88 mmHg  Pulse 125  Temp(Src) 98 F (36.7 C) (Oral)  Resp 24  Ht 5\' 3"  (1.6 m)  SpO2 100%   Physical Exam  Constitutional: She is oriented to person, place, and time. She appears well-developed and well-nourished. No distress.  HENT:  Head:  Normocephalic and atraumatic.  Mouth/Throat: Oropharynx is clear and moist.  Eyes: Conjunctivae and EOM are normal. Pupils are equal, round, and reactive to light.  Neck: Normal range of motion. Neck supple.  Cardiovascular: Normal heart sounds, intact distal pulses and normal pulses.  An irregularly irregular rhythm present. Tachycardia present.   AFIB  Pulmonary/Chest: Effort normal and breath sounds normal. No respiratory distress. She has no wheezes.  Abdominal: Soft. Bowel sounds are normal. There is no tenderness. There is no guarding.  Musculoskeletal: Normal range of motion.  2+ pitting edema BLE  Neurological: She is alert and oriented to person, place, and time.  Skin: Skin is warm and dry. She is not diaphoretic.  Psychiatric: She has a normal mood and affect.  Nursing note and vitals reviewed.   ED Course  Procedures (including critical care time)  CRITICAL CARE Performed by: Larene Pickett   Total critical care time: 45  Critical care time was exclusive of separately billable procedures and treating other patients.  Critical care was necessary to treat or prevent imminent or life-threatening deterioration.  Critical care was time spent personally by me on the following activities: development of treatment plan with patient and/or surrogate as well as nursing, discussions with consultants, evaluation of patient's response to treatment, examination of patient, obtaining history from patient or surrogate, ordering and performing treatments and interventions, ordering and review of laboratory studies, ordering and review of radiographic studies, pulse oximetry and re-evaluation of patient's condition.  Medications  diltiazem (CARDIZEM) injection 10 mg (10 mg Intravenous Given 10/07/14 1206)    Followed by  diltiazem (CARDIZEM) 100 mg in dextrose 5 % 100 mL (1 mg/mL) infusion (5 mg/hr Intravenous New Bag/Given 10/07/14 1209)    Labs Review Labs Reviewed  CBC WITH  DIFFERENTIAL/PLATELET - Abnormal; Notable for the following:    Hemoglobin 11.0 (*)    HCT 35.1 (*)    MCV 77.5 (*)    MCH 24.3 (*)    RDW 16.3 (*)    All other components within normal limits  BASIC METABOLIC PANEL - Abnormal; Notable for the following:    Chloride 100 (*)    Glucose, Bld 165 (*)    BUN 23 (*)    All other components within normal limits  BRAIN NATRIURETIC PEPTIDE - Abnormal; Notable for the following:    B Natriuretic Peptide 365.9 (*)    All other components within normal limits  I-STAT TROPOININ, ED    Imaging Review Dg Chest Port 1 View  10/07/2014   CLINICAL DATA:  Shortness of breath for 1 week, began Cardizem 1 week ago for atrial fibrillation, tachycardia, addition history of diabetes mellitus, hypertension  EXAM: PORTABLE CHEST - 1 VIEW  COMPARISON:  Portable  exam 1201 hours compared to 12/13/2012  FINDINGS: Enlargement of cardiac silhouette with pulmonary vascular congestion.  Atherosclerotic calcification aorta.  Mild chronic elevation of RIGHT diaphragm.  Minimal chronic pleural thickening RIGHT chest.  No acute infiltrate, pleural effusion or pneumothorax.  Bones demineralized.  IMPRESSION: Enlargement of cardiac silhouette with pulmonary vascular congestion.  No acute abnormalities.   Electronically Signed   By: Lavonia Dana M.D.   On: 10/07/2014 12:15     EKG Interpretation   Date/Time:  Thursday October 07 2014 11:32:22 EDT Ventricular Rate:  145 PR Interval:    QRS Duration: 84 QT Interval:  319 QTC Calculation: 495 R Axis:   -19 Text Interpretation:  Afib/flut and V-paced complexes No further rhythm  analysis attempted due to paced rhythm Borderline left axis deviation Low  voltage, extremity and precordial leads Nonspecific T abnormalities,  lateral leads Borderline prolonged QT interval Atrial fibrillation with  rapid ventricular response T wave abnormality Abnormal ekg Confirmed by  Carmin Muskrat  MD ([id number]) on 10/07/2014 11:47:13 AM       MDM   Final diagnoses:  SOB (shortness of breath)  Atrial fibrillation, unspecified   81 y.o. F here with SOB.  Recent diagnosis of A. fib by PCP, started on PO Cardizem.  Presents here in AFIB w/RVR, rate in 150's with noted dyspnea. She was given Cardizem bolus and started on drip. Lab work and chest x-ray pending at this time. Anticipate admission to cardiology.  Labwork overall reassuring, troponin negative. BNP is elevated at 365. There is mild vascular congestion noted on chest x-ray which I suspect is due to her A. fib as her recent Myoview 6 months ago had a normal EF and she has no known hx of CHF.  After Cardizem, heart rate has slowed, but remains in A. fib. Case was discussed with cardiology, will admit for further management.  Larene Pickett, PA-C 10/07/14 1445  Carmin Muskrat, MD 10/07/14 [phone]

## 2014-10-07 NOTE — Progress Notes (Signed)
  Echocardiogram 2D Echocardiogram has been performed.  Ann Fletcher 10/07/2014, 4:44 PM

## 2014-10-07 NOTE — H&P (Signed)
Patient ID: DIAMONDNIQUE TIPPEN MRN: [id number], DOB/AGE: Mar 02, 1933   Admit date: 10/07/2014   Primary Physician: [name], MD Primary Cardiologist: [name]  Pt. Profile:  81 year old Caucasian female with past medical history of HTN, HLD, lower extremity edema, DM, hypothyroidism and recently diagnosed paroxysmal atrial fibrillation present with a-fib with RVR  Problem List  Past Medical History  Diagnosis Date  . Osteoporosis   . Diabetes mellitus without complication   . Thyroid disease   . High cholesterol   . Arthritis   . Hypertension   . Lower extremity edema   . Atrial fibrillation     Past Surgical History  Procedure Laterality Date  . Back surgery  2001, 2003, 2006    x 3  . Replacement total knee Right 07/2008  . Tubal ligation       Allergies  Allergies  Allergen Reactions  . Nsaids Other (See Comments)    Stomach cramps  . Betadine [Povidone Iodine] Itching    HPI  The patient is a 81 year old Caucasian female with past medical history of HTN, HLD, lower extremity edema, DM, hypothyroidism and recently diagnosed paroxysmal atrial fibrillation. She has been followed by [name] and was started on 40 mg daily of Lasix. The last time she saw [name] was in November 2015, at which time she was complaining of lower extremity edema. She was also noted to have a right carotid bruit, carotid ultrasound was done which showed only mild plaque bilaterally. She had nuclear stress test in December 2015 which was negative with EF 77%.  Last week, her caregiver took her to her PCPs office for increasing shortness of breath. She was noted to have atrial fibrillation which is new for her. She will return to her PCPs office for 2 more days for medication adjustment. She was previously on Toprol-XL 25 mg daily, this has been increased to 100 mg daily since last week. 120 mg long-acting diltiazem was also added to her medical regimen in an attempt to control her  heart rate. She was also started on 150 mg twice a day of Pradaxa and has been taking for at least past 6 days. Her PCP has recommended her to seek cardiology [name] for close follow-up. She was scheduled to see [name] on 79/80/2016, however this morning, her caregiver noticed she was severely short of breath with bounding JVD. She also endorsed some orthopnea and paroxysmal nocturnal dyspnea especially last night. She was taken to [facility] on 7/7 for further evaluation. Significant laboratory finding on arrival include BNP 365, hemoglobin 11.0. Chest x-ray shows mild vascular congestion. EKG showed atrial fibrillation with RVR. She was started on IV diltiazem and cardiology has been consulted for A. fib with RVR and acute on chronic diastolic heart failure.  Home Medications  Prior to Admission medications   Medication Sig Start Date End Date Taking? Authorizing Provider  albuterol (PROVENTIL HFA;VENTOLIN HFA) 108 (90 BASE) MCG/ACT inhaler Inhale 2 puffs into the lungs 3 (three) times daily as needed for wheezing.    Historical Provider, MD  aspirin 81 MG tablet Take 81 mg by mouth daily.    Historical Provider, MD  atorvastatin (LIPITOR) 40 MG tablet Take 40 mg by mouth daily.    Historical Provider, MD  calcium-vitamin D (OSCAL WITH D) 500-200 MG-UNIT per tablet Take 1 tablet by mouth.    Historical Provider, MD  cholecalciferol (VITAMIN D) 400 UNITS TABS tablet Take 400 Units by mouth 2 (two) times  daily.    Historical Provider, MD  furosemide (LASIX) 40 MG tablet Take 40 mg by mouth daily.    Historical Provider, MD  glimepiride (AMARYL) 4 MG tablet Take 4 mg by mouth 2 (two) times daily with a meal.    Historical Provider, MD  HYDROcodone-acetaminophen (NORCO/VICODIN) 5-325 MG per tablet  01/15/14   Historical Provider, MD  levothyroxine (SYNTHROID, LEVOTHROID) 75 MCG tablet Take 75 mcg by mouth daily before breakfast.    Historical Provider, MD  LORazepam (ATIVAN) 1 MG tablet  Take 1 mg by mouth every 8 (eight) hours as needed for anxiety.    Historical Provider, MD  meloxicam (MOBIC) 7.5 MG tablet Take 7.5 mg by mouth daily.    Historical Provider, MD  metoprolol succinate (TOPROL-XL) 25 MG 24 hr tablet Take 25 mg by mouth daily.    Historical Provider, MD  pioglitazone (ACTOS) 30 MG tablet Take 30 mg by mouth daily.    Historical Provider, MD  vitamin E 400 UNIT capsule Take 400 Units by mouth daily.    Historical Provider, MD    Family History  Family History  Problem Relation Age of Onset  . Pneumonia Mother   . Heart attack Father   . Diabetes Sister     Social History  History   Social History  . Marital Status: Married    Spouse Name: N/A  . Number of Children: N/A  . Years of Education: N/A   Occupational History  . Not on file.   Social History Main Topics  . Smoking status: Never Smoker   . Smokeless tobacco: Not on file  . Alcohol Use: No  . Drug Use: No  . Sexual Activity: Yes    Birth Control/ Protection: Surgical   Other Topics Concern  . Not on file   Social History Narrative     Review of Systems General:  No chills, fever, night sweats or weight changes.  Cardiovascular:  No chest pain +dyspnea on exertion, edema, orthopnea, paroxysmal nocturnal dyspnea. No cardiac awareness of a-fib Dermatological: No rash, lesions/masses Respiratory: No cough, dyspnea Urologic: No hematuria, dysuria Abdominal:   No nausea, vomiting, diarrhea, bright red blood per rectum, melena, or hematemesis Neurologic:  No visual changes, changes in mental status. +wkns All other systems reviewed and are otherwise negative except as noted above.  Physical Exam  Blood pressure 132/71, pulse 111, temperature 98 F (36.7 C), temperature source Oral, resp. rate 24, height 5\' 3"  (1.6 m), SpO2 99 %.  General: Pleasant, NAD Psych: Normal affect. Neuro: Alert and oriented X 3. Moves all extremities spontaneously. HEENT: Normal  Neck: Supple without  bruits  +JVD. Lungs:  Resp regular and unlabored Bibasilar rale.  Heart: Tachycardic, irregular. no s3, s4, or murmurs. Abdomen: Soft, non-tender, non-distended, BS + x 4.  Extremities: No clubbing, cyanosis. DP/PT/Radials 2+ and equal bilaterally. 2-3+ pitting edema in the LE  Labs  Troponin [facility] Test)  Recent Labs  10/07/14 1219  TROPIPOC 0.01    Lab Results  Component Value Date   WBC 7.7 10/07/2014   HGB 11.0* 10/07/2014   HCT 35.1* 10/07/2014   MCV 77.5* 10/07/2014   PLT 161 10/07/2014     Recent Labs Lab 10/07/14 1150  NA 138  K 3.7  CL 100*  CO2 27  BUN 23*  CREATININE 0.87  CALCIUM 9.7  GLUCOSE 165*     Radiology/Studies  Dg Chest Port 1 View  10/07/2014   CLINICAL DATA:  Shortness of  breath for 1 week, began Cardizem 1 week ago for atrial fibrillation, tachycardia, addition history of diabetes mellitus, hypertension  EXAM: PORTABLE CHEST - 1 VIEW  COMPARISON:  Portable exam 1201 hours compared to 12/13/2012  FINDINGS: Enlargement of cardiac silhouette with pulmonary vascular congestion.  Atherosclerotic calcification aorta.  Mild chronic elevation of RIGHT diaphragm.  Minimal chronic pleural thickening RIGHT chest.  No acute infiltrate, pleural effusion or pneumothorax.  Bones demineralized.  IMPRESSION: Enlargement of cardiac silhouette with pulmonary vascular congestion.  No acute abnormalities.   Electronically Signed   By: Lavonia Dana M.D.   On: 10/07/2014 12:15    ECG  A-fib with RVR  Echocardiogram  pending    ASSESSMENT AND PLAN  1. Persistent atrial fibrillation with RVR  - CHA2DS2-Vasc score 6 (female, age, HF, HTN, DM), started on pradaxa 150mg  BID since last week  - continue in a-fib > 7 days, although PCP tried to attempt outpt rate by uptitrating metoprolol XL to 100mg  daily and add 120mg  diltiazem CD, her HR remain fast  - admit for IV diltiazem, will consider TEE with DCCV  - obtain echocardiogram, obtain TSH. S/p negative  myoview in 03/2014  2. Acute on chronic diastolic HF  - EF [id number] on myoview in 03/2014  - pending echo  3. HTN 4. HLD 5. DM  Signed, Almyra Deforest, PA-C 10/07/2014, 2:14 PM As above, patient seen and examined. Briefly she is an 81 year old female with past medical history of diabetes mellitus and hypertension admitted with acute diastolic congestive heart failure and new onset atrial fibrillation. Patient complains of progressive dyspnea for approximately 2 weeks. She also noted bilateral lower extremity edema. No chest pain or palpitations. She was seen by her primary care physician last week and noted to be in new onset atrial fibrillation. AV node blocking agents were added as was pradaxa. However she has continued to become more dyspneic and presented to the emergency room. Electrocardiogram shows atrial fibrillation with a rapid ventricular response, low voltage and nonspecific ST changes. Plan to admit to telemetry. Control heart rate with IV Cardizem. Check echocardiogram for LV function and TSH. She is volume overloaded. Diurese with Lasix and follow renal function. Continue Pradaxa (CHADSvasc 6); proceed with TEE guided cardioversion in AM (pt extremely symptomatic). Kirk Ruths

## 2014-10-07 NOTE — ED Notes (Signed)
Cardiology at bedside.

## 2014-10-07 NOTE — ED Notes (Signed)
Pt here via EMS with c/o SOB x 1 week. Pt denies pain, n/v, dizziness. Pt was placed on 2L [place] for comfort by EMS- does not wear oxygen at home. Pt able to speak in complete sentences. Pt reports that she was started on cardizem 1 week ago for afib. Pt in afib at this time- Rate of 140. NAD during triage.

## 2014-10-08 ENCOUNTER — Inpatient Hospital Stay (HOSPITAL_COMMUNITY): Payer: Medicare Other | Admitting: Anesthesiology

## 2014-10-08 ENCOUNTER — Inpatient Hospital Stay (HOSPITAL_COMMUNITY): Payer: Medicare Other

## 2014-10-08 ENCOUNTER — Encounter (HOSPITAL_COMMUNITY)
Admission: EM | Disposition: A | Discharge status: Home / Self Care | Payer: Self-pay | Source: Home / Self Care | Attending: Cardiology

## 2014-10-08 ENCOUNTER — Encounter (HOSPITAL_COMMUNITY): Payer: Self-pay | Admitting: *Deleted

## 2014-10-08 ENCOUNTER — Ambulatory Visit: Payer: Medicaid Other | Admitting: Cardiovascular Disease

## 2014-10-08 DIAGNOSIS — I340 Nonrheumatic mitral (valve) insufficiency: Secondary | ICD-10-CM

## 2014-10-08 DIAGNOSIS — I4891 Unspecified atrial fibrillation: Secondary | ICD-10-CM

## 2014-10-08 HISTORY — PX: CARDIOVERSION: SHX1299

## 2014-10-08 HISTORY — PX: TEE WITHOUT CARDIOVERSION: SHX5443

## 2014-10-08 LAB — BASIC METABOLIC PANEL
Anion gap: 8 (ref 5–15)
BUN: 17 mg/dL (ref 6–20)
CO2: 30 mmol/L (ref 22–32)
Calcium: 8.6 mg/dL — ABNORMAL LOW (ref 8.9–10.3)
Chloride: 100 mmol/L — ABNORMAL LOW (ref 101–111)
Creatinine, Ser: 0.8 mg/dL (ref 0.44–1.00)
GFR calc Af Amer: >60 mL/min (ref >60)
GFR calc non Af Amer: >60 mL/min (ref >60)
Glucose, Bld: 115 mg/dL — ABNORMAL HIGH (ref 65–99)
Potassium: 3.8 mmol/L (ref 3.5–5.1)
Sodium: 138 mmol/L (ref 135–145)

## 2014-10-08 LAB — GLUCOSE, CAPILLARY
Glucose-Capillary: 127 mg/dL — ABNORMAL HIGH (ref 65–99)
Glucose-Capillary: 136 mg/dL — ABNORMAL HIGH (ref 65–99)
Glucose-Capillary: 160 mg/dL — ABNORMAL HIGH (ref 65–99)
Glucose-Capillary: 213 mg/dL — ABNORMAL HIGH (ref 65–99)

## 2014-10-08 LAB — TSH: TSH: 1.026 u[IU]/mL (ref 0.350–4.500)

## 2014-10-08 LAB — TROPONIN I: Troponin I: <0.03 ng/mL (ref <0.031)

## 2014-10-08 SURGERY — ECHOCARDIOGRAM, TRANSESOPHAGEAL
Anesthesia: Monitor Anesthesia Care

## 2014-10-08 MED ORDER — MEPERIDINE HCL 25 MG/ML IJ SOLN: 6.2500 mg | INTRAMUSCULAR | Status: DC | PRN

## 2014-10-08 MED ORDER — AMIODARONE HCL IN DEXTROSE 360-4.14 MG/200ML-% IV SOLN
30.0000 mg/h | INTRAVENOUS | Status: DC
  Administered 2014-10-08 – 2014-10-09 (×2): 30 mg/h via INTRAVENOUS
  Filled 2014-10-08 (×4): qty 200

## 2014-10-08 MED ORDER — PROMETHAZINE HCL 25 MG/ML IJ SOLN: 6.2500 mg | INTRAMUSCULAR | Status: DC | PRN

## 2014-10-08 MED ORDER — AMIODARONE LOAD VIA INFUSION
150.0000 mg | Freq: Once | INTRAVENOUS | Status: AC
  Administered 2014-10-08: 150 mg via INTRAVENOUS
  Filled 2014-10-08: qty 83.34

## 2014-10-08 MED ORDER — AMIODARONE HCL IN DEXTROSE 360-4.14 MG/200ML-% IV SOLN
60.0000 mg/h | INTRAVENOUS | Status: AC
  Administered 2014-10-08: 60 mg/h via INTRAVENOUS
  Filled 2014-10-08 (×2): qty 200

## 2014-10-08 MED ORDER — AMIODARONE HCL IN DEXTROSE 360-4.14 MG/200ML-% IV SOLN
60.0000 mg/h | INTRAVENOUS | Status: AC
  Administered 2014-10-08: 60 mg/h via INTRAVENOUS
  Filled 2014-10-08: qty 200

## 2014-10-08 MED ORDER — AMIODARONE HCL IN DEXTROSE 360-4.14 MG/200ML-% IV SOLN
30.0000 mg/h | INTRAVENOUS | Status: DC
  Filled 2014-10-08 (×3): qty 200

## 2014-10-08 MED ORDER — PROPOFOL 10 MG/ML IV BOLUS
INTRAVENOUS | Status: DC | PRN
  Administered 2014-10-08 (×2): 10 mg via INTRAVENOUS

## 2014-10-08 MED ORDER — SODIUM CHLORIDE 0.9 % IV SOLN: INTRAVENOUS | Status: DC

## 2014-10-08 MED ORDER — SODIUM CHLORIDE 0.9 % IV SOLN
250.0000 mL | INTRAVENOUS | Status: DC
Start: 2014-10-08 — End: 2014-10-15
  Administered 2014-10-08: 14:00:00 via INTRAVENOUS

## 2014-10-08 MED ORDER — SODIUM CHLORIDE 0.9 % IJ SOLN
3.0000 mL | INTRAMUSCULAR | Status: DC | PRN
  Administered 2014-10-12: 3 mL via INTRAVENOUS
  Filled 2014-10-08: qty 3

## 2014-10-08 MED ORDER — SODIUM CHLORIDE 0.9 % IJ SOLN
3.0000 mL | Freq: Two times a day (BID) | INTRAMUSCULAR | Status: DC
Start: 2014-10-08 — End: 2014-10-15
  Administered 2014-10-08 – 2014-10-11 (×5): 3 mL via INTRAVENOUS
  Administered 2014-10-11: 10 mL via INTRAVENOUS
  Administered 2014-10-12 – 2014-10-15 (×5): 3 mL via INTRAVENOUS

## 2014-10-08 MED ORDER — PROPOFOL INFUSION 10 MG/ML OPTIME
INTRAVENOUS | Status: DC | PRN
  Administered 2014-10-08: 50 ug/kg/min via INTRAVENOUS

## 2014-10-08 NOTE — Transfer of Care (Signed)
Immediate Anesthesia Transfer of Care Note  Patient: Ann Fletcher  Procedure(s) Performed: Procedure(s): TRANSESOPHAGEAL ECHOCARDIOGRAM (TEE) (N/A) CARDIOVERSION (N/A)  Patient Location: PACU and Endoscopy Unit  Anesthesia Type:MAC  Level of Consciousness: awake, alert , oriented and patient cooperative  Airway & Oxygen Therapy: Patient Spontanous Breathing and Patient connected to nasal cannula oxygen  Post-op Assessment: Report given to RN and Post -op Vital signs reviewed and stable  Post vital signs: Reviewed and stable  Last Vitals:  Filed Vitals:   10/08/14 1441  BP: 119/49  Pulse: 113  Temp:   Resp: 19    Complications: No apparent anesthesia complications

## 2014-10-08 NOTE — Anesthesia Preprocedure Evaluation (Addendum)
Anesthesia Evaluation  Patient identified by MRN, date of birth, ID band Patient awake    Reviewed: Allergy & Precautions, NPO status , Patient's Chart, lab work & pertinent test results, reviewed documented beta blocker date and time   Airway Mallampati: II       Dental  (+) Teeth Intact, Dental Advisory Given   Pulmonary shortness of breath,  Inhalers     rales    Cardiovascular hypertension, Pt. on medications Rhythm:Irregular  ECHO EF 55% 10/2014   Neuro/Psych Carotids <49% stenosis Bilat 2015    GI/Hepatic negative GI ROS,   Endo/Other  diabetes, Type 2  Renal/GU      Musculoskeletal   Abdominal (+)  Abdomen: soft.    Peds  Hematology 11/35   Anesthesia Other Findings   Reproductive/Obstetrics                           Anesthesia Physical Anesthesia Plan  ASA: III  Anesthesia Plan: MAC   Post-op Pain Management:    Induction: Intravenous  Airway Management Planned: Nasal Cannula  Additional Equipment:   Intra-op Plan:   Post-operative Plan:   Informed Consent: I have reviewed the patients History and Physical, chart, labs and discussed the procedure including the risks, benefits and alternatives for the proposed anesthesia with the patient or authorized representative who has indicated his/her understanding and acceptance.     Plan Discussed with:   Anesthesia Plan Comments:         Anesthesia Quick Evaluation

## 2014-10-08 NOTE — Progress Notes (Signed)
UR completed.    Jashae Wiggs W. Izaac Reisig, RN, BSN  Trauma/Neuro ICU Case Manager [phone] 

## 2014-10-08 NOTE — Progress Notes (Signed)
  Echocardiogram Echocardiogram Transesophageal has been performed.  Ann Fletcher 10/08/2014, 2:47 PM

## 2014-10-08 NOTE — Progress Notes (Signed)
1000 hr remained afib 130's cardizem drip increased tom 10 mgs as referred to Cecilie Kicks ,[place] monitored

## 2014-10-08 NOTE — Discharge Instructions (Addendum)
Information on my medicine - Pradaxa (dabigatran)  This medication education was reviewed with me or my healthcare representative as part of my discharge preparation.  The pharmacist that spoke with me during my hospital stay was:  Blossom [facility]ops [facility]  Why was Pradaxa prescribed for you? Pradaxa was prescribed for you to reduce the risk of forming blood clots that cause a stroke if you have a medical condition called atrial fibrillation (a type of irregular heartbeat).    What do you Need to know about PradAXa? Take your Pradaxa TWICE DAILY - one capsule in the morning and one tablet in the evening with or without food.  It would be best to take the doses about the same time each day.  The capsules should not be broken, chewed or opened - they must be swallowed whole.  Do not store Pradaxa in other medication containers - once the bottle is opened the Pradaxa should be used within FOUR months; throw away any capsules that havent been by that time.  Take Pradaxa exactly as prescribed by your doctor.  DO NOT stop taking Pradaxa without talking to the doctor who prescribed the medication.  Stopping without other stroke prevention medication to take the place of Pradaxa may increase your risk of developing a clot that causes a stroke.  Refill your prescription before you run out.  After discharge, you should have regular check-up appointments with your healthcare provider that is prescribing your Pradaxa.  In the future your dose may need to be changed if your kidney function or weight changes by a significant amount.  What do you do if you miss a dose? If you miss a dose, take it as soon as you remember on the same day.  If your next dose is less than 6 hours away, skip the missed dose.  Do not take two doses of PRADAXA at the same time.  Important Safety Information A possible side effect of Pradaxa is bleeding. You should call your healthcare provider right away if you experience any of  the following: ? Bleeding from an injury or your nose that does not stop. ? Unusual colored urine (red or dark brown) or unusual colored stools (red or black). ? Unusual bruising for unknown reasons. ? A serious fall or if you hit your head (even if there is no bleeding).  Some medicines may interact with Pradaxa and might increase your risk of bleeding or clotting while on Pradaxa. To help avoid this, consult your healthcare provider or pharmacist prior to using any new prescription or non-prescription medications, including herbals, vitamins, non-steroidal anti-inflammatory drugs (NSAIDs) and supplements.  This website has more information on Pradaxa (dabigatran): https://www.pradaxa.com    Avoid salty food, keep daily intake of fluid to <2 L per day, weigh yourself daily, contact cardiology if weight increase by more than 3 lbs overnight or 5 lbs in the single week. Atrial Fibrillation Atrial fibrillation is a condition that causes your heart to beat irregularly. It may also cause your heart to beat faster than normal. Atrial fibrillation can prevent your heart from pumping blood normally. It increases your risk of stroke and heart problems. HOME CARE  Take medications as told by your doctor.  Only take medications that your doctor says are safe. Some medications can make the condition worse or happen again.  If blood thinners were prescribed by your doctor, take them exactly as told. Too much can cause bleeding. Too little and you will not have the needed protection  against stroke and other problems.  Perform blood tests at home if told by your doctor.  Perform blood tests exactly as told by your doctor.  Do not drink alcohol.  Do not drink beverages with caffeine such as coffee, soda, and some teas.  Maintain a healthy weight.  Do not use diet pills unless your doctor says they are safe. They may make heart problems worse.  Follow diet instructions as told by your  doctor.  Exercise regularly as told by your doctor.  Keep all follow-up appointments. GET HELP IF:  You notice a change in the speed, rhythm, or strength of your heartbeat.  You suddenly begin peeing (urinating) more often.  You get tired more easily when moving or exercising. GET HELP RIGHT AWAY IF:   You have chest or belly (abdominal) pain.  You feel sick to your stomach (nauseous).  You are short of breath.  You suddenly have swollen feet and ankles.  You feel dizzy.  You face, arms, or legs feel numb or weak.  There is a change in your vision or speech. MAKE SURE YOU:   Understand these instructions.  Will watch your condition.  Will get help right away if you are not doing well or get worse. Document Released: 12/27/2007 Document Revised: 08/03/2013 Document Reviewed: 04/29/2012 [facility] Patient Information 2015 [place], [place]. This information is not intended to replace advice given to you by your health care provider. Make sure you discuss any questions you have with your health care provider. Heart Failure Heart failure means your heart has trouble pumping blood. This makes it hard for your body to work well. Heart failure is usually a long-term (chronic) condition. You must take good care of yourself and follow your doctor's treatment plan. HOME CARE  Take your heart medicine as told by your doctor.  Do not stop taking medicine unless your doctor tells you to.  Do not skip any dose of medicine.  Refill your medicines before they run out.  Take other medicines only as told by your doctor or pharmacist.  Stay active if told by your doctor. The elderly and people with severe heart failure should talk with a doctor about physical activity.  Eat heart-healthy foods. Choose foods that are without trans fat and are low in saturated fat, cholesterol, and salt (sodium). This includes fresh or frozen fruits and vegetables, fish, lean meats, fat-free or low-fat  dairy foods, whole grains, and high-fiber foods. Lentils and dried peas and beans (legumes) are also good choices.  Limit salt if told by your doctor.  Cook in a healthy way. Roast, grill, broil, bake, poach, steam, or stir-fry foods.  Limit fluids as told by your doctor.  Weigh yourself every morning. Do this after you pee (urinate) and before you eat breakfast. Write down your weight to give to your doctor.  Take your blood pressure and write it down if your doctor tells you to.  Ask your doctor how to check your pulse. Check your pulse as told.  Lose weight if told by your doctor.  Stop smoking or chewing tobacco. Do not use gum or patches that help you quit without your doctor's approval.  Schedule and go to doctor visits as told.  Nonpregnant women should have no more than 1 drink a day. Men should have no more than 2 drinks a day. Talk to your doctor about drinking alcohol.  Stop illegal drug use.  Stay current with shots (immunizations).  Manage your health conditions as told  by your doctor.  Learn to manage your stress.  Rest when you are tired.  If it is really hot outside:  Avoid intense activities.  Use air conditioning or fans, or get in a cooler place.  Avoid caffeine and alcohol.  Wear loose-fitting, lightweight, and light-colored clothing.  If it is really cold outside:  Avoid intense activities.  Layer your clothing.  Wear mittens or gloves, a hat, and a scarf when going outside.  Avoid alcohol.  Learn about heart failure and get support as needed.  Get help to maintain or improve your quality of life and your ability to care for yourself as needed. GET HELP IF:   You gain 03 lb/1.4 kg or more in 1 day or 05 lb/2.3 kg in a week.  You are more short of breath than usual.  You cannot do your normal activities.  You tire easily.  You cough more than normal, especially with activity.  You have any or more puffiness (swelling) in areas  such as your hands, feet, ankles, or belly (abdomen).  You cannot sleep because it is hard to breathe.  You feel like your heart is beating fast (palpitations).  You get dizzy or light-headed when you stand up. GET HELP RIGHT AWAY IF:   You have trouble breathing.  There is a change in mental status, such as becoming less alert or not being able to focus.  You have chest pain or discomfort.  You faint. MAKE SURE YOU:   Understand these instructions.  Will watch your condition.  Will get help right away if you are not doing well or get worse. Document Released: 12/27/2007 Document Revised: 08/03/2013 Document Reviewed: 05/05/2012 [facility] Patient Information 2015 [place], [place]. This information is not intended to replace advice given to you by your health care provider. Make sure you discuss any questions you have with your health care provider.

## 2014-10-08 NOTE — CV Procedure (Signed)
    Transesophageal Echocardiogram Note  Ann Fletcher [id number] 04/12/1932  Procedure: Transesophageal Echocardiogram Indications: atrial fib   Procedure Details Consent: Obtained Time Out: Verified patient identification, verified procedure, site/side was marked, verified correct patient position, special equipment/implants available, Radiology Safety Procedures followed,  medications/allergies/relevent history reviewed, required imaging and test results available.  Performed  Medications: Propofol : 112 mg iv   Left Ventrical:  Normal LV function   Mitral Valve: mild - moderate MR   Aortic Valve: 3 leaflet valve ,   Tricuspid Valve: moderate TR   Pulmonic Valve: poorly visualized   Left Atrium/ Left atrial appendage: no thrombi,  + spontaneous contrast   Atrial septum: no PFO or ASD by color flow   Aorta: not visualized    Complications: No apparent complications Patient did tolerate procedure well.      Cardioversion Note  Ann Fletcher [id number] 26-Oct-1932  Procedure: DC Cardioversion Indications: rapid atrial fib   Procedure Details Consent: Obtained Time Out: Verified patient identification, verified procedure, site/side was marked, verified correct patient position, special equipment/implants available, Radiology Safety Procedures followed,  medications/allergies/relevent history reviewed, required imaging and test results available.  Performed  The patient has been on adequate anticoagulation.  The patient received IV propofol ( see above for dose )  for sedation.  Synchronous cardioversion was performed at 120, 200, 200  joules.  The cardioversion was  Unsuccessful.  She may need the addition of an antiarrhythmic Has significant respirator difficulty.  Needs more diuresis.   Complications: No apparent complications Patient did tolerate procedure well.   [name], Brooke Bonito., MD, [facility] 10/08/2014, 2:37 PM

## 2014-10-08 NOTE — Anesthesia Postprocedure Evaluation (Signed)
  Anesthesia Post-op Note  Patient: Ann Fletcher  Procedure(s) Performed: Procedure(s): TRANSESOPHAGEAL ECHOCARDIOGRAM (TEE) (N/A) CARDIOVERSION (N/A)  Patient Location: PACU  Anesthesia Type:MAC  Level of Consciousness: awake  Airway and Oxygen Therapy: Patient Spontanous Breathing and Patient connected to nasal cannula oxygen  Post-op Pain: none  Post-op Assessment: Post-op Vital signs reviewed              Post-op Vital Signs: Reviewed  Last Vitals:  Filed Vitals:   10/08/14 1455  BP:   Pulse: 111  Temp:   Resp: 20    Complications: No apparent anesthesia complications

## 2014-10-08 NOTE — Interval H&P Note (Signed)
History and Physical Interval Note:  10/08/2014 1:49 PM  Ann Fletcher  has presented today for surgery, with the diagnosis of afib  The various methods of treatment have been discussed with the patient and family. After consideration of risks, benefits and other options for treatment, the patient has consented to  Procedure(s): TRANSESOPHAGEAL ECHOCARDIOGRAM (TEE) (N/A) CARDIOVERSION (N/A) as a surgical intervention .  The patient's history has been reviewed, patient examined, no change in status, stable for surgery.  I have reviewed the patient's chart and labs.  Questions were answered to the patient's satisfaction.     Tyrann Donaho, Wonda Cheng

## 2014-10-08 NOTE — Progress Notes (Addendum)
Pt. Profile:  81 year old Caucasian female with past medical history of HTN, HLD, lower extremity edema, DM, hypothyroidism and recently diagnosed paroxysmal atrial fibrillation present with a-fib with RVR since at least last week.  She was placed on Pradaxa and increase of BB and CCB.   Subjective: No pain, though still dyspneic she feels better.     Objective: Vital signs in last 24 hours: Temp:  [97.5 F (36.4 C)-98.1 F (36.7 C)] 98.1 F (36.7 C) (07/08 0524) Pulse Rate:  [65-150] 116 (07/08 0524) Resp:  [16-61] 17 (07/08 0524) BP: (106-163)/(71-92) 139/80 mmHg (07/08 0524) SpO2:  [96 %-100 %] 100 % (07/08 0524) Weight:  [187 lb (84.823 kg)-188 lb 8 oz (85.503 kg)] 187 lb (84.823 kg) (07/08 0528) Weight change:  Last BM Date: 10/07/14 Intake/Output from previous day: -1193 07/07 0701 - 07/08 0700 In: 506.8 [P.O.:480; I.V.:26.8] Out: 1700 [Urine:1700] Intake/Output this shift: Total I/O In: 0  Out: 425 [Urine:425]  PE: General:Pleasant affect, NAD Skin:Warm and dry, brisk capillary refill HEENT:normocephalic, sclera clear, mucus membranes moist Neck:supple, + JVD Heart:irreg irreg  without murmur, gallup, rub or click Lungs:diminished and with crackles, no rhonchi, or wheezes [id number], non tender, + BS, do not palpate liver spleen or masses Ext:no lower ext edema, 2+ pedal pulses, 2+ radial pulses Neuro:alert and oriented X 3, MAE, follows commands, + facial symmetry Tele: a fib with RVR  dilt drip at 5     Lab Results:  Recent Labs  10/07/14 1150  WBC 7.7  HGB 11.0*  HCT 35.1*  PLT 161   BMET  Recent Labs  10/07/14 1150 10/08/14 0345  NA 138 138  K 3.7 3.8  CL 100* 100*  CO2 27 30  GLUCOSE 165* 115*  BUN 23* 17  CREATININE 0.87 0.80  CALCIUM 9.7 8.6*    Recent Labs  10/07/14 1838 10/08/14 0345  TROPONINI <0.03 <0.03    Lab Results  Component Value Date   TSH 1.026 10/08/2014     Studies/Results: Dg Chest Port 1  View  10/07/2014   CLINICAL DATA:  Shortness of breath for 1 week, began Cardizem 1 week ago for atrial fibrillation, tachycardia, addition history of diabetes mellitus, hypertension  EXAM: PORTABLE CHEST - 1 VIEW  COMPARISON:  Portable exam 1201 hours compared to 12/13/2012  FINDINGS: Enlargement of cardiac silhouette with pulmonary vascular congestion.  Atherosclerotic calcification aorta.  Mild chronic elevation of RIGHT diaphragm.  Minimal chronic pleural thickening RIGHT chest.  No acute infiltrate, pleural effusion or pneumothorax.  Bones demineralized.  IMPRESSION: Enlargement of cardiac silhouette with pulmonary vascular congestion.  No acute abnormalities.   Electronically Signed   By: Lavonia Dana M.D.   On: 10/07/2014 12:15   ECHO: Study Conclusions  - Left ventricle: The cavity size was normal. Wall thickness was normal. Systolic function was normal. The estimated ejection fraction was in the range of 50% to 55%. - Mitral valve: There was mild regurgitation. - Left atrium: The atrium was moderately dilated. - Right ventricle: The cavity size was moderately dilated. - Right atrium: The atrium was severely dilated. - Tricuspid valve: There was severe regurgitation. - Pericardium, extracardiac: Small posterior pericardial effusion   Medications: I have reviewed the patient's current medications. Scheduled Meds: . atorvastatin  40 mg Oral q1800  . dabigatran  150 mg Oral BID  . furosemide  40 mg Intravenous BID  . glimepiride  4 mg Oral BID WC  . insulin aspart  0-5 Units Subcutaneous  QHS  . insulin aspart  0-9 Units Subcutaneous TID WC  . levothyroxine  75 mcg Oral QAC breakfast  . LORazepam  1 mg Oral QHS  . metoprolol succinate  100 mg Oral Daily  . vitamin E  400 Units Oral Daily   Continuous Infusions: . diltiazem (CARDIZEM) infusion 5 mg/hr (10/07/14 1731)   PRN Meds:.acetaminophen, albuterol, ondansetron (ZOFRAN) IV   Assessment/Plan:  1. Persistent atrial  fibrillation with RVR, neg MI - CHA2DS2-Vasc score 6 (female, age, HF, HTN, DM), started on pradaxa 150mg  BID since last week - continue in a-fib > 7 days, although PCP tried to attempt outpt rate by uptitrating metoprolol XL to 100mg  daily and add 120mg  diltiazem CD, her HR remain fast - admit for IV diltiazem, is NPO for  TEE with DCCV but not ordered- she is NPO -  TSH stable S/p negative myoview in 03/2014  Attempting to arrange TEE DCCV  2. Acute on chronic diastolic HF - EF [id number] on myoview in 03/2014             - nowEF 50% to 55%., small pericardial effusion mod lt. atrial enlargement   LOS: 1 day   Time spent with pt. : 15 minutes. [facility]  Nurse Practitioner Certified Pager [phone] or after 5pm and on weekends call [phone] 10/08/2014, 10:24 AM   Pt. Seen and examined. Agree with the NP/PA-C note as written.  Will arrange for TEE/DCCV today with Dr. Acie Fredrickson. If this is not successful, may need to add an anti-arryhtmic such as amiodarone.  Pixie Casino, MD, [facility] Attending Cardiologist [place]

## 2014-10-09 DIAGNOSIS — I481 Persistent atrial fibrillation: Secondary | ICD-10-CM

## 2014-10-09 LAB — BASIC METABOLIC PANEL
Anion gap: 11 (ref 5–15)
BUN: 23 mg/dL — ABNORMAL HIGH (ref 6–20)
CO2: 28 mmol/L (ref 22–32)
Calcium: 8.8 mg/dL — ABNORMAL LOW (ref 8.9–10.3)
Chloride: 100 mmol/L — ABNORMAL LOW (ref 101–111)
Creatinine, Ser: 1.07 mg/dL — ABNORMAL HIGH (ref 0.44–1.00)
GFR calc Af Amer: 55 mL/min — ABNORMAL LOW (ref >60)
GFR calc non Af Amer: 48 mL/min — ABNORMAL LOW (ref >60)
Glucose, Bld: 138 mg/dL — ABNORMAL HIGH (ref 65–99)
Potassium: 3.8 mmol/L (ref 3.5–5.1)
Sodium: 139 mmol/L (ref 135–145)

## 2014-10-09 LAB — GLUCOSE, CAPILLARY
Glucose-Capillary: 137 mg/dL — ABNORMAL HIGH (ref 65–99)
Glucose-Capillary: 158 mg/dL — ABNORMAL HIGH (ref 65–99)
Glucose-Capillary: 121 mg/dL — ABNORMAL HIGH (ref 65–99)
Glucose-Capillary: 145 mg/dL — ABNORMAL HIGH (ref 65–99)

## 2014-10-09 LAB — MAGNESIUM: Magnesium: 2 mg/dL (ref 1.7–2.4)

## 2014-10-09 MED ORDER — DILTIAZEM HCL ER COATED BEADS 180 MG PO CP24
180.0000 mg | ORAL_CAPSULE | Freq: Every day | ORAL | Status: DC
  Administered 2014-10-09 – 2014-10-10 (×2): 180 mg via ORAL
  Filled 2014-10-09 (×3): qty 1

## 2014-10-09 MED ORDER — AMIODARONE HCL 200 MG PO TABS
400.0000 mg | ORAL_TABLET | Freq: Two times a day (BID) | ORAL | Status: DC
  Administered 2014-10-09 – 2014-10-15 (×13): 400 mg via ORAL
  Filled 2014-10-09 (×14): qty 2

## 2014-10-09 NOTE — Progress Notes (Signed)
IV medications Cardizem and Amiodarone changed to tablets. Awaiting medication from pharmacy to administered to patient per order.

## 2014-10-09 NOTE — Progress Notes (Signed)
Pt. Profile:  81 year old Caucasian female with past medical history of HTN, HLD, lower extremity edema, DM, hypothyroidism and recently diagnosed paroxysmal atrial fibrillation present with a-fib with RVR since at least last week.  She was placed on Pradaxa and increase of BB and CCB. Failed cardioversion on 10/08/14.  Subjective: No pain, though still dyspneic she feels better.     Objective: Vital signs in last 24 hours: Temp:  [98.1 F (36.7 C)-98.8 F (37.1 C)] 98.1 F (36.7 C) (07/09 0650) Pulse Rate:  [92-117] 92 (07/09 0650) Resp:  [18-30] 22 (07/09 0650) BP: (119-157)/(49-92) 142/80 mmHg (07/09 0650) SpO2:  [97 %-100 %] 100 % (07/09 0650) Weight change:  Last BM Date: 10/08/14 Intake/Output from previous day: -1193 07/08 0701 - 07/09 0700 In: 650 [P.O.:350; I.V.:300] Out: 1575 [Urine:1575] Intake/Output this shift: Total I/O In: 240 [P.O.:240] Out: -   PE: General:Pleasant affect, NAD Skin:Warm and dry, brisk capillary refill HEENT:normocephalic, sclera clear, mucus membranes moist Neck:supple, + JVD Heart:irreg irreg  Normal rate without murmur, gallup, rub or click Lungs:diminished and with crackles, no rhonchi, or wheezes [id number], non tender, + BS, do not palpate liver spleen or masses Ext:no lower ext edema, 2+ pedal pulses, 2+ radial pulses Neuro:alert and oriented X 3, MAE, follows commands, + facial symmetry Tele: a fib with RVR  dilt drip at 5     Lab Results:  Recent Labs  10/07/14 1150  WBC 7.7  HGB 11.0*  HCT 35.1*  PLT 161   BMET  Recent Labs  10/08/14 0345 10/09/14 0300  NA 138 139  K 3.8 3.8  CL 100* 100*  CO2 30 28  GLUCOSE 115* 138*  BUN 17 23*  CREATININE 0.80 1.07*  CALCIUM 8.6* 8.8*    Recent Labs  10/07/14 1838 10/08/14 0345  TROPONINI <0.03 <0.03    Lab Results  Component Value Date   TSH 1.026 10/08/2014     Studies/Results: Dg Chest Port 1 View  10/07/2014   CLINICAL DATA:  Shortness of breath  for 1 week, began Cardizem 1 week ago for atrial fibrillation, tachycardia, addition history of diabetes mellitus, hypertension  EXAM: PORTABLE CHEST - 1 VIEW  COMPARISON:  Portable exam 1201 hours compared to 12/13/2012  FINDINGS: Enlargement of cardiac silhouette with pulmonary vascular congestion.  Atherosclerotic calcification aorta.  Mild chronic elevation of RIGHT diaphragm.  Minimal chronic pleural thickening RIGHT chest.  No acute infiltrate, pleural effusion or pneumothorax.  Bones demineralized.  IMPRESSION: Enlargement of cardiac silhouette with pulmonary vascular congestion.  No acute abnormalities.   Electronically Signed   By: Lavonia Dana M.D.   On: 10/07/2014 12:15   ECHO: Study Conclusions  - Left ventricle: The cavity size was normal. Wall thickness was normal. Systolic function was normal. The estimated ejection fraction was in the range of 50% to 55%. - Mitral valve: There was mild regurgitation. - Left atrium: The atrium was moderately dilated. - Right ventricle: The cavity size was moderately dilated. - Right atrium: The atrium was severely dilated. - Tricuspid valve: There was severe regurgitation. - Pericardium, extracardiac: Small posterior pericardial effusion   Medications: I have reviewed the patient's current medications. Scheduled Meds: . atorvastatin  40 mg Oral q1800  . dabigatran  150 mg Oral BID  . furosemide  40 mg Intravenous BID  . glimepiride  4 mg Oral BID WC  . insulin aspart  0-5 Units Subcutaneous QHS  . insulin aspart  0-9 Units Subcutaneous TID WC  .  levothyroxine  75 mcg Oral QAC breakfast  . LORazepam  1 mg Oral QHS  . metoprolol succinate  100 mg Oral Daily  . sodium chloride  3 mL Intravenous Q12H  . vitamin E  400 Units Oral Daily   Continuous Infusions: . sodium chloride    . amiodarone    . amiodarone 30 mg/hr (10/09/14 1103)  . diltiazem (CARDIZEM) infusion 10 mg/hr (10/09/14 0537)   PRN Meds:.acetaminophen, albuterol,  ondansetron (ZOFRAN) IV, sodium chloride   TELE: AFIB 80-90's. Personally viewed. Assessment/Plan:  1. Persistent atrial fibrillation now rate controlled, neg MI  - failed cardioversion 10/08/14  - IV amiodarone started 10/08/14 - changing to oral 400 BID. - CHA2DS2-Vasc score 6 (female, age, HF, HTN, DM), started on pradaxa 150mg  BID since last week - continue in a-fib > 7 days, although PCP tried to attempt outpt rate by uptitrating metoprolol XL to 100mg  daily and add 120mg  diltiazem CD, her HR remain fast -  IV diltiazem10mg /hr - changing to CD 180 -  TSH stable S/p negative myoview in 03/2014  - Rate controlled afib currently.   2. Acute on chronic diastolic HF - EF [id number] on myoview in 03/2014             - nowEF 50% to 55%., small pericardial effusion mod lt. atrial enlargement  - lasix 40 IV BID. Creat mildly increased. Continue today.Still with dyspnea but improved.   Will consider repeat cardioversion once loaded with amiodarone.      LOS: 2 days   Candee Furbish, MD

## 2014-10-10 DIAGNOSIS — I5031 Acute diastolic (congestive) heart failure: Secondary | ICD-10-CM

## 2014-10-10 LAB — GLUCOSE, CAPILLARY
Glucose-Capillary: 108 mg/dL — ABNORMAL HIGH (ref 65–99)
Glucose-Capillary: 180 mg/dL — ABNORMAL HIGH (ref 65–99)
Glucose-Capillary: 140 mg/dL — ABNORMAL HIGH (ref 65–99)
Glucose-Capillary: 112 mg/dL — ABNORMAL HIGH (ref 65–99)
Glucose-Capillary: 128 mg/dL — ABNORMAL HIGH (ref 65–99)

## 2014-10-10 MED ORDER — FUROSEMIDE 10 MG/ML IJ SOLN
40.0000 mg | Freq: Once | INTRAMUSCULAR | Status: AC
  Administered 2014-10-10: 40 mg via INTRAVENOUS
  Filled 2014-10-10: qty 4

## 2014-10-10 MED ORDER — POTASSIUM CHLORIDE CRYS ER 20 MEQ PO TBCR
20.0000 meq | EXTENDED_RELEASE_TABLET | Freq: Three times a day (TID) | ORAL | Status: DC
  Administered 2014-10-10 – 2014-10-15 (×17): 20 meq via ORAL
  Filled 2014-10-10 (×18): qty 1

## 2014-10-10 MED ORDER — FUROSEMIDE 10 MG/ML IJ SOLN
80.0000 mg | Freq: Three times a day (TID) | INTRAMUSCULAR | Status: DC
  Administered 2014-10-10 – 2014-10-15 (×17): 80 mg via INTRAVENOUS
  Filled 2014-10-10 (×19): qty 8

## 2014-10-10 NOTE — Progress Notes (Signed)
Pt. Profile:  81 year old Caucasian female with past medical history of HTN, HLD, lower extremity edema, DM, hypothyroidism and recently diagnosed paroxysmal atrial fibrillation present with a-fib with RVR and CHF since at least last week.  She was placed on Pradaxa and increase of BB and CCB. Failed cardioversion on 10/08/14, Amiodarone added.   Subjective: No pain, though still dyspneic.     Objective: Vital signs in last 24 hours: Temp:  [97.6 F (36.4 C)-98.2 F (36.8 C)] 98.2 F (36.8 C) (07/10 0531) Pulse Rate:  [84-105] 105 (07/10 0531) Resp:  [18-20] 18 (07/10 0531) BP: (112-135)/(66-72) 112/66 mmHg (07/10 0531) SpO2:  [97 %-100 %] 98 % (07/10 0531) Weight:  [187 lb (84.823 kg)] 187 lb (84.823 kg) (07/10 0531) Weight change:  Last BM Date: 10/08/14 Intake/Output from previous day: -1193 07/09 0701 - 07/10 0700 In: 1180 [P.O.:1180] Out: 450 [Urine:450] Intake/Output this shift:    PE: General:Pleasant affect, NAD Skin:Warm and dry, brisk capillary refill HEENT:normocephalic, sclera clear, mucus membranes moist Neck:supple, + JVD Heart:irreg irreg  Normal rate without murmur, gallup, rub or click Lungs:diminished and with crackles, no rhonchi, or wheezes [id number], non tender, + BS, do not palpate liver spleen or masses Ext: 1+ lower ext edema, 2+ pedal pulses, 2+ radial pulses Neuro:alert and oriented X 3, MAE, follows commands, + facial symmetry Tele: atrial fibrillation with CVR    Lab Results:  Recent Labs  10/07/14 1150  WBC 7.7  HGB 11.0*  HCT 35.1*  PLT 161   BMET  Recent Labs  10/08/14 0345 10/09/14 0300  NA 138 139  K 3.8 3.8  CL 100* 100*  CO2 30 28  GLUCOSE 115* 138*  BUN 17 23*  CREATININE 0.80 1.07*  CALCIUM 8.6* 8.8*    Recent Labs  10/07/14 1838 10/08/14 0345  TROPONINI <0.03 <0.03    Lab Results  Component Value Date   TSH 1.026 10/08/2014     ECHO: Study Conclusions  - Left ventricle: The cavity size was  normal. Wall thickness was normal. Systolic function was normal. The estimated ejection fraction was in the range of 50% to 55%. - Mitral valve: There was mild regurgitation. - Left atrium: The atrium was moderately dilated. - Right ventricle: The cavity size was moderately dilated. - Right atrium: The atrium was severely dilated. - Tricuspid valve: There was severe regurgitation. - Pericardium, extracardiac: Small posterior pericardial effusion   Medications: I have reviewed the patient's current medications. Scheduled Meds: . amiodarone  400 mg Oral BID  . atorvastatin  40 mg Oral q1800  . dabigatran  150 mg Oral BID  . diltiazem  180 mg Oral Daily  . furosemide  40 mg Intravenous Once  . furosemide  80 mg Intravenous [id number]  . glimepiride  4 mg Oral BID WC  . insulin aspart  0-5 Units Subcutaneous QHS  . insulin aspart  0-9 Units Subcutaneous TID WC  . levothyroxine  75 mcg Oral QAC breakfast  . LORazepam  1 mg Oral QHS  . metoprolol succinate  100 mg Oral Daily  . potassium chloride  20 mEq Oral TID  . sodium chloride  3 mL Intravenous Q12H  . vitamin E  400 Units Oral Daily   Continuous Infusions: . sodium chloride     PRN Meds:.acetaminophen, albuterol, ondansetron (ZOFRAN) IV, sodium chloride   TELE: AFIB 80-90's. Personally viewed. Assessment/Plan:  1. Persistent atrial fibrillation now rate controlled, neg MI  - failed cardioversion 10/08/14  -  IV amiodarone started 10/08/14 - changing to oral 400 BID. - CHA2DS2-Vasc score 6 (female, age, HF, HTN, DM), started on pradaxa 150mg  BID since last week - continue in a-fib > 7 days, although PCP tried to attempt outpt rate by uptitrating metoprolol XL to 100mg  daily and add 120mg  diltiazem CD -  TSH stable S/p negative myoview in 03/2014  - Rate controlled afib currently.   2. Acute on chronic diastolic HF - EF [id number] on myoview in 03/2014             - nowEF 50% to  55%., small pericardial effusion mod lt. atrial enlargement  - lasix 40 IV BID. Creat mildly increased. Continue today.Still needs diuresis- no significant wgt loss, I/O only negative 1.3L    Plan: increase Lasix for 24 hrs. Will consider repeat cardioversion once loaded with amiodarone.    Kerin Ransom PA-C 10/10/2014 8:52 AM   Personally seen and examined. Agree with above. 81 year old failed cardioversion with diastolic HF  -loading with amiodarone 400 BID -Pradaxa -needs better diuresis - increase lasix to 80 TID from 40 BID -If dyspnea improves, consider DC home soon with continued load of amiodarone and cardioversion in near future. AFIB remains rate controlled.   Candee Furbish, MD

## 2014-10-11 ENCOUNTER — Encounter (HOSPITAL_COMMUNITY): Payer: Self-pay | Admitting: Cardiovascular Disease

## 2014-10-11 ENCOUNTER — Ambulatory Visit: Payer: Medicare Other | Admitting: Podiatry

## 2014-10-11 DIAGNOSIS — I4891 Unspecified atrial fibrillation: Secondary | ICD-10-CM

## 2014-10-11 LAB — GLUCOSE, CAPILLARY
Glucose-Capillary: 84 mg/dL (ref 65–99)
Glucose-Capillary: 191 mg/dL — ABNORMAL HIGH (ref 65–99)
Glucose-Capillary: 121 mg/dL — ABNORMAL HIGH (ref 65–99)
Glucose-Capillary: 135 mg/dL — ABNORMAL HIGH (ref 65–99)

## 2014-10-11 LAB — BASIC METABOLIC PANEL
Anion gap: 8 (ref 5–15)
BUN: 22 mg/dL — ABNORMAL HIGH (ref 6–20)
CO2: 33 mmol/L — ABNORMAL HIGH (ref 22–32)
Calcium: 8.4 mg/dL — ABNORMAL LOW (ref 8.9–10.3)
Chloride: 100 mmol/L — ABNORMAL LOW (ref 101–111)
Creatinine, Ser: 0.93 mg/dL (ref 0.44–1.00)
GFR calc Af Amer: >60 mL/min (ref >60)
GFR calc non Af Amer: 57 mL/min — ABNORMAL LOW (ref >60)
Glucose, Bld: 88 mg/dL (ref 65–99)
Potassium: 3.8 mmol/L (ref 3.5–5.1)
Sodium: 141 mmol/L (ref 135–145)

## 2014-10-11 MED ORDER — DILTIAZEM HCL ER COATED BEADS 240 MG PO CP24
240.0000 mg | ORAL_CAPSULE | Freq: Every day | ORAL | Status: DC
  Administered 2014-10-11 – 2014-10-15 (×5): 240 mg via ORAL
  Filled 2014-10-11 (×5): qty 1

## 2014-10-11 NOTE — Progress Notes (Signed)
Patient Name: Ann Fletcher Date of Encounter: 10/11/2014  Primary Cardiologist: [name]  Pt. Profile:  81 year old Caucasian female with past medical history of HTN, HLD, lower extremity edema, DM, hypothyroidism and recently diagnosed paroxysmal atrial fibrillation present with a-fib with RVR since at least last week. She was placed on Pradaxa and increase of BB and CCB. Failed cardioversion on 10/08/14.   Principal Problem:   Atrial fibrillation with RVR Active Problems:   Essential hypertension   Diabetes   Hyperlipidemia   Lower extremity edema    SUBJECTIVE  Denies any CP, no significant SOB.   CURRENT MEDS . amiodarone  400 mg Oral BID  . atorvastatin  40 mg Oral q1800  . dabigatran  150 mg Oral BID  . diltiazem  180 mg Oral Daily  . furosemide  80 mg Intravenous TID  . glimepiride  4 mg Oral BID WC  . insulin aspart  0-5 Units Subcutaneous QHS  . insulin aspart  0-9 Units Subcutaneous TID WC  . levothyroxine  75 mcg Oral QAC breakfast  . LORazepam  1 mg Oral QHS  . metoprolol succinate  100 mg Oral Daily  . potassium chloride  20 mEq Oral TID  . sodium chloride  3 mL Intravenous Q12H  . vitamin E  400 Units Oral Daily    OBJECTIVE  Filed Vitals:   10/10/14 1400 10/10/14 2132 10/11/14 0600 10/11/14 0653  BP: 119/67 116/73  108/57  Pulse: 114 113  119  Temp: 98.7 F (37.1 C) 98.7 F (37.1 C)  98.4 F (36.9 C)  TempSrc: Oral Oral  Oral  Resp: 18 20  18   Height:      Weight:   184 lb 4.8 oz (83.598 kg)   SpO2: 96% 97%  98%    Intake/Output Summary (Last 24 hours) at 10/11/14 0914 Last data filed at 10/11/14 0848  Gross per 24 hour  Intake   1640 ml  Output   2625 ml  Net   -985 ml   Filed Weights   10/08/14 0528 10/10/14 0531 10/11/14 0600  Weight: 187 lb (84.823 kg) 187 lb (84.823 kg) 184 lb 4.8 oz (83.598 kg)    PHYSICAL EXAM  General: Pleasant, NAD. Neuro: Alert and oriented X 3. Moves all extremities spontaneously. Psych: Normal  affect. HEENT:  Normal  Neck: Supple without bruits or JVD. Lungs:  Resp regular and unlabored, CTA. Heart: irregular. no s3, s4, or murmurs. Abdomen: Soft, non-tender, non-distended, BS + x 4.  Extremities: No clubbing, cyanosis or edema. DP/PT/Radials 2+ and equal bilaterally.  Accessory Clinical Findings  Basic Metabolic Panel  Recent Labs  10/09/14 0300 10/11/14 0354  NA 139 141  K 3.8 3.8  CL 100* 100*  CO2 28 33*  GLUCOSE 138* 88  BUN 23* 22*  CREATININE 1.07* 0.93  CALCIUM 8.8* 8.4*  MG 2.0  --     TELE A-fib with HR 80-110s    ECG  No new EKG  Echocardiogram  LV EF: 50% -  55%  ------------------------------------------------------------------- Indications:   Atrial fibrillation - 427.31.  ------------------------------------------------------------------- History:  Risk factors: Lower extremity edema. Hypertension. Diabetes mellitus.  ------------------------------------------------------------------- Study Conclusions  - Left ventricle: The cavity size was normal. Wall thickness was normal. Systolic function was normal. The estimated ejection fraction was in the range of 50% to 55%. - Mitral valve: There was mild regurgitation. - Left atrium: The atrium was moderately dilated. - Right ventricle: The cavity size was moderately dilated. - Right  atrium: The atrium was severely dilated. - Tricuspid valve: There was severe regurgitation. - Pericardium, extracardiac: Small posterior pericardial effusion    Radiology/Studies  Dg Chest Port 1 View  10/07/2014   CLINICAL DATA:  Shortness of breath for 1 week, began Cardizem 1 week ago for atrial fibrillation, tachycardia, addition history of diabetes mellitus, hypertension  EXAM: PORTABLE CHEST - 1 VIEW  COMPARISON:  Portable exam 1201 hours compared to 12/13/2012  FINDINGS: Enlargement of cardiac silhouette with pulmonary vascular congestion.  Atherosclerotic calcification aorta.  Mild chronic  elevation of RIGHT diaphragm.  Minimal chronic pleural thickening RIGHT chest.  No acute infiltrate, pleural effusion or pneumothorax.  Bones demineralized.  IMPRESSION: Enlargement of cardiac silhouette with pulmonary vascular congestion.  No acute abnormalities.   Electronically Signed   By: Lavonia Dana M.D.   On: 10/07/2014 12:15    ASSESSMENT AND PLAN  1. Persistent atrial fibrillation now rate controlled, neg MI - failed cardioversion 10/08/14 - IV amiodarone started 10/08/14 - changing to oral 400 BID. - CHA2DS2-Vasc score 6 (female, age, HF, HTN, DM), started on pradaxa 150mg  BID 1 week prior to arrival after diagnosed with new a-fib - continue in a-fib > 7 days, although PCP tried to attempt outpt rate by uptitrating metoprolol XL to 100mg  daily and add 120mg  diltiazem CD, her HR remain fast - TSH stable. S/p negative myoview in 03/2014  - IV diltiazem stopped, switched to 180mg  PO dilitazem, HR borderline controlled with HR 80-110s, will increase to 240mg  diltiazem. Talked with patient, she appears to be hesitant to go through with another DCCV during this admission  2. Acute on chronic diastolic HF - EF [id number] on myoview in 03/2014  - nowEF 50% to 55%., small pericardial effusion mod lt. atrial enlargement - lasix increased to 80mg  TID, lung clear, continue to have 2+ pitting edema in LE. Will continue lasix. Likely contributed partially by severe MR  Signed, Woodward Ku Pager: [phone]    Attending Note:   The patient was seen and examined.  Agree with assessment and plan as noted above.  Changes made to the above note as needed.  1. Rapid atrial fib:   She remains in atrial fib.  Rate is still fast although slower than when I saw her last week during the cardioversion.   The cardioversion last week was not successful - she has bilateral moderate - severe atrial enlargement.    She may not be able to hold sinus rhythm but I think we should try another cardioversion again later this week after adequate amiodarone load.    2. Chronic diastolic CHF :  Should improve as we slow her HR .    [name], Brooke Bonito., MD, [facility] 10/11/2014, 11:38 AM [address],  [place] Pager [phone]

## 2014-10-11 NOTE — Care Management (Signed)
Important Message  Patient Details  Name: Ann Fletcher MRN: [id number] Date of Birth: February 06, 1933   Medicare Important Message Given:  Yes-second notification given    Nathen May 10/11/2014, 2:34 PM

## 2014-10-12 ENCOUNTER — Encounter (HOSPITAL_COMMUNITY): Payer: Self-pay | Admitting: Cardiovascular Disease

## 2014-10-12 DIAGNOSIS — I5032 Chronic diastolic (congestive) heart failure: Secondary | ICD-10-CM

## 2014-10-12 LAB — GLUCOSE, CAPILLARY
Glucose-Capillary: 88 mg/dL (ref 65–99)
Glucose-Capillary: 155 mg/dL — ABNORMAL HIGH (ref 65–99)
Glucose-Capillary: 134 mg/dL — ABNORMAL HIGH (ref 65–99)
Glucose-Capillary: 201 mg/dL — ABNORMAL HIGH (ref 65–99)

## 2014-10-12 LAB — BASIC METABOLIC PANEL
Anion gap: 8 (ref 5–15)
BUN: 23 mg/dL — ABNORMAL HIGH (ref 6–20)
CO2: 33 mmol/L — ABNORMAL HIGH (ref 22–32)
Calcium: 8.6 mg/dL — ABNORMAL LOW (ref 8.9–10.3)
Chloride: 100 mmol/L — ABNORMAL LOW (ref 101–111)
Creatinine, Ser: 0.85 mg/dL (ref 0.44–1.00)
GFR calc Af Amer: >60 mL/min (ref >60)
GFR calc non Af Amer: >60 mL/min (ref >60)
Glucose, Bld: 80 mg/dL (ref 65–99)
Potassium: 3.8 mmol/L (ref 3.5–5.1)
Sodium: 141 mmol/L (ref 135–145)

## 2014-10-12 MED ORDER — GLIMEPIRIDE 4 MG PO TABS
4.0000 mg | ORAL_TABLET | Freq: Two times a day (BID) | ORAL | Status: DC
  Administered 2014-10-13 – 2014-10-15 (×5): 4 mg via ORAL
  Filled 2014-10-12 (×7): qty 1

## 2014-10-12 NOTE — Progress Notes (Signed)
Patient Name: Ann Fletcher Date of Encounter: 10/12/2014  Primary Cardiologist: [name]  Pt. Profile:  81 year old Caucasian female with past medical history of HTN, HLD, lower extremity edema, DM, hypothyroidism and recently diagnosed paroxysmal atrial fibrillation present with a-fib with RVR since at least last week. She was placed on Pradaxa and increase of BB and CCB. Failed cardioversion on 10/08/14.   Principal Problem:   Atrial fibrillation with RVR Active Problems:   Essential hypertension   Diabetes   Hyperlipidemia   Lower extremity edema    SUBJECTIVE  Denies any CP, no significant SOB.   CURRENT MEDS . amiodarone  400 mg Oral BID  . atorvastatin  40 mg Oral q1800  . dabigatran  150 mg Oral BID  . diltiazem  240 mg Oral Daily  . furosemide  80 mg Intravenous TID  . glimepiride  4 mg Oral BID WC  . insulin aspart  0-5 Units Subcutaneous QHS  . insulin aspart  0-9 Units Subcutaneous TID WC  . levothyroxine  75 mcg Oral QAC breakfast  . LORazepam  1 mg Oral QHS  . metoprolol succinate  100 mg Oral Daily  . potassium chloride  20 mEq Oral TID  . sodium chloride  3 mL Intravenous Q12H  . vitamin E  400 Units Oral Daily    OBJECTIVE  Filed Vitals:   10/11/14 2047 10/12/14 0455 10/12/14 0804 10/12/14 0915  BP: 122/76 112/54 132/86 128/61  Pulse: 116 93 118 112  Temp: 98.1 F (36.7 C) 98.8 F (37.1 C)  97.8 F (36.6 C)  TempSrc: Oral Oral  Oral  Resp: 18 20  20   Height:      Weight:  182 lb 3.2 oz (82.645 kg)    SpO2: 99% 98%  98%    Intake/Output Summary (Last 24 hours) at 10/12/14 0923 Last data filed at 10/12/14 0856  Gross per 24 hour  Intake    660 ml  Output   2150 ml  Net  -1490 ml   Filed Weights   10/10/14 0531 10/11/14 0600 10/12/14 0455  Weight: 187 lb (84.823 kg) 184 lb 4.8 oz (83.598 kg) 182 lb 3.2 oz (82.645 kg)    PHYSICAL EXAM  General: Pleasant, NAD. Neuro: Alert and oriented X 3. Moves all extremities  spontaneously. Psych: Normal affect. HEENT:  Normal  Neck: Supple without bruits or JVD. Lungs:  Resp regular and unlabored, CTA. Heart: irregular. no s3, s4, or murmurs. Abdomen: Soft, non-tender, non-distended, BS + x 4.  Extremities: No clubbing, cyanosis. DP/PT/Radials 2+ and equal bilaterally. 1+ pitting edema  Accessory Clinical Findings  Basic Metabolic Panel  Recent Labs  10/11/14 0354 10/12/14 0341  NA 141 141  K 3.8 3.8  CL 100* 100*  CO2 33* 33*  GLUCOSE 88 80  BUN 22* 23*  CREATININE 0.93 0.85  CALCIUM 8.4* 8.6*    TELE A-fib with HR 80-110s    ECG  No new EKG  Echocardiogram  LV EF: 50% -  55%  ------------------------------------------------------------------- Indications:   Atrial fibrillation - 427.31.  ------------------------------------------------------------------- History:  Risk factors: Lower extremity edema. Hypertension. Diabetes mellitus.  ------------------------------------------------------------------- Study Conclusions  - Left ventricle: The cavity size was normal. Wall thickness was normal. Systolic function was normal. The estimated ejection fraction was in the range of 50% to 55%. - Mitral valve: There was mild regurgitation. - Left atrium: The atrium was moderately dilated. - Right ventricle: The cavity size was moderately dilated. - Right atrium: The atrium  was severely dilated. - Tricuspid valve: There was severe regurgitation. - Pericardium, extracardiac: Small posterior pericardial effusion    Radiology/Studies  Dg Chest Port 1 View  10/07/2014   CLINICAL DATA:  Shortness of breath for 1 week, began Cardizem 1 week ago for atrial fibrillation, tachycardia, addition history of diabetes mellitus, hypertension  EXAM: PORTABLE CHEST - 1 VIEW  COMPARISON:  Portable exam 1201 hours compared to 12/13/2012  FINDINGS: Enlargement of cardiac silhouette with pulmonary vascular congestion.  Atherosclerotic  calcification aorta.  Mild chronic elevation of RIGHT diaphragm.  Minimal chronic pleural thickening RIGHT chest.  No acute infiltrate, pleural effusion or pneumothorax.  Bones demineralized.  IMPRESSION: Enlargement of cardiac silhouette with pulmonary vascular congestion.  No acute abnormalities.   Electronically Signed   By: Lavonia Dana M.D.   On: 10/07/2014 12:15    ASSESSMENT AND PLAN  1. Persistent atrial fibrillation now rate controlled, neg MI - failed cardioversion 10/08/14 - IV amiodarone started 10/08/14 - changing to oral 400 BID on 7/9. - CHA2DS2-Vasc score 6 (female, age, HF, HTN, DM), started on pradaxa 150mg  BID 1 week prior to arrival after diagnosed with new a-fib by PCP - TSH stable. S/p negative myoview in 03/2014  - Continue toprol XL 100mg  daily, diltiazem increased to 240mg . Will attempt DCCV again after loading with amiodarone, will discus with MD regarding timing.   2. Acute on chronic diastolic HF - EF [id number] on myoview in 03/2014  - nowEF 50% to 55%., small pericardial effusion mod lt. atrial enlargement - IV lasix increased to 80mg  TID, lung clear, LE edema improving, so is Cr. BUN:Cr ratio however elevated, likely transition to PO lasix tomorrow. Contributed partially by severe MR  Weston Brass Woodward Ku Pager: [phone]  Attending Note:   The patient was seen and examined.  Agree with assessment and plan as noted above.  Changes made to the above note as needed.  1. Atrial fib:  She has been getting amiodarone - initially IV and now getting 400 BID .  She has had 3.6 gm of amio thus far.  Anticipate another cardioversion attempt on Friday after she has more of an optimal load.   2. Chronic diastolic CHF:   She is much less symptomatic with her slower HR .  She may improve further if we are able to get her into sinus rhythm.   [name], Brooke Bonito., MD, [facility] 10/12/2014,  10:04 AM [address]  [place] Pager [phone]

## 2014-10-13 DIAGNOSIS — I10 Essential (primary) hypertension: Secondary | ICD-10-CM

## 2014-10-13 DIAGNOSIS — Z7901 Long term (current) use of anticoagulants: Secondary | ICD-10-CM

## 2014-10-13 LAB — GLUCOSE, CAPILLARY
Glucose-Capillary: 103 mg/dL — ABNORMAL HIGH (ref 65–99)
Glucose-Capillary: 136 mg/dL — ABNORMAL HIGH (ref 65–99)
Glucose-Capillary: 145 mg/dL — ABNORMAL HIGH (ref 65–99)
Glucose-Capillary: 146 mg/dL — ABNORMAL HIGH (ref 65–99)

## 2014-10-13 LAB — BASIC METABOLIC PANEL
Anion gap: 10 (ref 5–15)
BUN: 22 mg/dL — ABNORMAL HIGH (ref 6–20)
CO2: 33 mmol/L — ABNORMAL HIGH (ref 22–32)
Calcium: 8.8 mg/dL — ABNORMAL LOW (ref 8.9–10.3)
Chloride: 97 mmol/L — ABNORMAL LOW (ref 101–111)
Creatinine, Ser: 0.79 mg/dL (ref 0.44–1.00)
GFR calc Af Amer: >60 mL/min (ref >60)
GFR calc non Af Amer: >60 mL/min (ref >60)
Glucose, Bld: 105 mg/dL — ABNORMAL HIGH (ref 65–99)
Potassium: 3.8 mmol/L (ref 3.5–5.1)
Sodium: 140 mmol/L (ref 135–145)

## 2014-10-13 NOTE — Progress Notes (Signed)
Patient Name: Ann Fletcher Date of Encounter: 10/13/2014  Primary Cardiologist: [name]  Pt. Profile:  81 year old Caucasian female with past medical history of HTN, HLD, lower extremity edema, DM, and hypothyroidism. She was recently diagnosed with PAF and was placed on Pradaxa by her PCP. She presented to the ED 10/07/14 with a-fib with RVR and CHF.  She was admitted, started on Lasix, and her medications were adjusted for rate control. She failed cardioversion on 10/08/14 and was placed on Amiodarone.    Principal Problem:   Atrial fibrillation with RVR Active Problems:   Essential hypertension   Diabetes   Hyperlipidemia   Lower extremity edema    SUBJECTIVE  Denies any CP, no significant SOB.   CURRENT MEDS . amiodarone  400 mg Oral BID  . atorvastatin  40 mg Oral q1800  . dabigatran  150 mg Oral BID  . diltiazem  240 mg Oral Daily  . furosemide  80 mg Intravenous TID  . glimepiride  4 mg Oral BID WC  . insulin aspart  0-5 Units Subcutaneous QHS  . insulin aspart  0-9 Units Subcutaneous TID WC  . levothyroxine  75 mcg Oral QAC breakfast  . LORazepam  1 mg Oral QHS  . metoprolol succinate  100 mg Oral Daily  . potassium chloride  20 mEq Oral TID  . sodium chloride  3 mL Intravenous Q12H  . vitamin E  400 Units Oral Daily    OBJECTIVE  Filed Vitals:   10/12/14 0915 10/12/14 1514 10/12/14 2104 10/13/14 0621  BP: 128/61 131/67 121/63 123/69  Pulse: 112 97 114 119  Temp: 97.8 F (36.6 C) 98.1 F (36.7 C) 98 F (36.7 C) 97.8 F (36.6 C)  TempSrc: Oral Oral Oral Oral  Resp: 20 18 18 18   Height:      Weight:    175 lb 14.4 oz (79.788 kg)  SpO2: 98% 100% 97% 94%    Intake/Output Summary (Last 24 hours) at 10/13/14 1216 Last data filed at 10/13/14 0900  Gross per 24 hour  Intake    750 ml  Output   2870 ml  Net  -2120 ml   Filed Weights   10/11/14 0600 10/12/14 0455 10/13/14 0621  Weight: 184 lb 4.8 oz (83.598 kg) 182 lb 3.2 oz (82.645 kg) 175 lb 14.4  oz (79.788 kg)    PHYSICAL EXAM  General: Pleasant, NAD, obese Neuro: Alert and oriented X 3. Moves all extremities spontaneously. Psych: Normal affect. HEENT:  Normal  Neck: Supple without bruits or JVD. Lungs:  Decreased breath sounds Heart: irregular. no s3, s4, or murmurs. Abdomen: Soft, non-tender, non-distended, BS + x 4.  Extremities: No clubbing, cyanosis or edema. DP/PT/Radials 2+ and equal bilaterally.  Accessory Clinical Findings  Basic Metabolic Panel  Recent Labs  10/12/14 0341 10/13/14 0314  NA 141 140  K 3.8 3.8  CL 100* 97*  CO2 33* 33*  GLUCOSE 80 105*  BUN 23* 22*  CREATININE 0.85 0.79  CALCIUM 8.6* 8.8*    TELE A-fib with HR 80-120s    ECG  No new EKG  Echocardiogram  LV EF: 50% -  55%  ------------------------------------------------------------------- Indications:   Atrial fibrillation - 427.31.  ------------------------------------------------------------------- History:  Risk factors: Lower extremity edema. Hypertension. Diabetes mellitus.  ------------------------------------------------------------------- Study Conclusions  - Left ventricle: The cavity size was normal. Wall thickness was normal. Systolic function was normal. The estimated ejection fraction was in the range of 50% to 55%. - Mitral valve: There  was mild regurgitation. - Left atrium: The atrium was moderately dilated. - Right ventricle: The cavity size was moderately dilated. - Right atrium: The atrium was severely dilated. - Tricuspid valve: There was severe regurgitation. - Pericardium, extracardiac: Small posterior pericardial effusion    Radiology/Studies  Dg Chest Port 1 View  10/07/2014   CLINICAL DATA:  Shortness of breath for 1 week, began Cardizem 1 week ago for atrial fibrillation, tachycardia, addition history of diabetes mellitus, hypertension  EXAM: PORTABLE CHEST - 1 VIEW  COMPARISON:  Portable exam 1201 hours compared to 12/13/2012   FINDINGS: Enlargement of cardiac silhouette with pulmonary vascular congestion.  Atherosclerotic calcification aorta.  Mild chronic elevation of RIGHT diaphragm.  Minimal chronic pleural thickening RIGHT chest.  No acute infiltrate, pleural effusion or pneumothorax.  Bones demineralized.  IMPRESSION: Enlargement of cardiac silhouette with pulmonary vascular congestion.  No acute abnormalities.   Electronically Signed   By: Lavonia Dana M.D.   On: 10/07/2014 12:15    ASSESSMENT AND PLAN  1. Persistent atrial fibrillation now rate controlled, neg MI - failed cardioversion 10/08/14 - IV amiodarone started 10/08/14. - CHA2DS2-Vasc score 6 (female, age, HF, HTN, DM), started on pradaxa 150mg  BID 1 week prior to admission by PCP after being diagnosed with new a-fib - TSH stable. S/p negative myoview in 03/2014  2. Acute on chronic diastolic HF - EF [id number] on myoview in 03/2014  - nowEF 50% to 55%., small pericardial effusion mod lt. atrial enlargement -On  lasix 80mg  IV TID- wgt down from 188 to 175, I/O neg 6.4 L since admission  Plan: TEE DCCV Friday (I will call to schedule). Continue IV diuretics one more day.  Angelena Form PA-C 10/13/2014 12:26 PM   Attending Note:   The patient was seen and examined.  Agree with assessment and plan as noted above.  Changes made to the above note as needed.  Pt continues to load with amiodarone. Feeling better with the slower HR . Plan is for cardioversion on Friday .   [name], Brooke Bonito., MD, [facility] 10/13/2014, 12:35 PM [address]  [place] Pager [phone]

## 2014-10-14 ENCOUNTER — Inpatient Hospital Stay (HOSPITAL_COMMUNITY): Payer: Medicare Other

## 2014-10-14 DIAGNOSIS — Z7901 Long term (current) use of anticoagulants: Secondary | ICD-10-CM

## 2014-10-14 LAB — GLUCOSE, CAPILLARY
Glucose-Capillary: 103 mg/dL — ABNORMAL HIGH (ref 65–99)
Glucose-Capillary: 181 mg/dL — ABNORMAL HIGH (ref 65–99)
Glucose-Capillary: 171 mg/dL — ABNORMAL HIGH (ref 65–99)

## 2014-10-14 LAB — BASIC METABOLIC PANEL
Anion gap: 9 (ref 5–15)
BUN: 20 mg/dL (ref 6–20)
CO2: 33 mmol/L — ABNORMAL HIGH (ref 22–32)
Calcium: 9 mg/dL (ref 8.9–10.3)
Chloride: 98 mmol/L — ABNORMAL LOW (ref 101–111)
Creatinine, Ser: 0.97 mg/dL (ref 0.44–1.00)
GFR calc Af Amer: >60 mL/min (ref >60)
GFR calc non Af Amer: 54 mL/min — ABNORMAL LOW (ref >60)
Glucose, Bld: 88 mg/dL (ref 65–99)
Potassium: 4 mmol/L (ref 3.5–5.1)
Sodium: 140 mmol/L (ref 135–145)

## 2014-10-14 NOTE — Care Management Important Message (Signed)
Important Message  Patient Details  Name: Ann Fletcher MRN: [id number] Date of Birth: 01/24/1933   Medicare Important Message Given:  Yes-third notification given    Pricilla Handler 10/14/2014, 2:35 PM

## 2014-10-14 NOTE — Progress Notes (Signed)
Patient Name: Ann Fletcher Date of Encounter: 10/14/2014  Primary Cardiologist: [name]  Pt. Profile:  81 year old Caucasian female with past medical history of HTN, HLD, lower extremity edema, DM, and hypothyroidism. She was recently diagnosed with PAF and was placed on Pradaxa by her PCP. She presented to the ED 10/07/14 with a-fib with RVR and CHF.  She was admitted, started on Lasix, and her medications were adjusted for rate control. She failed cardioversion on 10/08/14 and was placed on Amiodarone.    Principal Problem:   Atrial fibrillation with RVR Active Problems:   Anticoagulated-Pradaxa   Essential hypertension   Diabetes   Hyperlipidemia   Lower extremity edema    SUBJECTIVE  Denies any CP, no significant SOB.   CURRENT MEDS . amiodarone  400 mg Oral BID  . atorvastatin  40 mg Oral q1800  . dabigatran  150 mg Oral BID  . diltiazem  240 mg Oral Daily  . furosemide  80 mg Intravenous TID  . glimepiride  4 mg Oral BID WC  . insulin aspart  0-5 Units Subcutaneous QHS  . insulin aspart  0-9 Units Subcutaneous TID WC  . levothyroxine  75 mcg Oral QAC breakfast  . LORazepam  1 mg Oral QHS  . metoprolol succinate  100 mg Oral Daily  . potassium chloride  20 mEq Oral TID  . sodium chloride  3 mL Intravenous Q12H  . vitamin E  400 Units Oral Daily    OBJECTIVE  Filed Vitals:   10/13/14 0621 10/13/14 1400 10/13/14 2250 10/14/14 0600  BP: 123/69 131/89 121/74 115/54  Pulse: 119 106 93 115  Temp: 97.8 F (36.6 C) 97.7 F (36.5 C) 98 F (36.7 C) 98.7 F (37.1 C)  TempSrc: Oral Oral Oral Oral  Resp: 18 18 17 18   Height:      Weight: 175 lb 14.4 oz (79.788 kg)   174 lb 8 oz (79.153 kg)  SpO2: 94% 97% 96% 94%    Intake/Output Summary (Last 24 hours) at 10/14/14 0834 Last data filed at 10/14/14 0744  Gross per 24 hour  Intake    800 ml  Output   1750 ml  Net   -950 ml   Filed Weights   10/12/14 0455 10/13/14 0621 10/14/14 0600  Weight: 182 lb 3.2 oz  (82.645 kg) 175 lb 14.4 oz (79.788 kg) 174 lb 8 oz (79.153 kg)    PHYSICAL EXAM  General: Pleasant, NAD, obese Neuro: Alert and oriented X 3. Moves all extremities spontaneously. Psych: Normal affect. HEENT:  Normal  Neck: Supple without bruits or JVD. Lungs:  Decreased breath sounds, crackles bilat bases Heart: irregular. no s3, s4, or murmurs. Abdomen: Soft, non-tender, non-distended, BS + x 4.  Extremities: No clubbing, cyanosis or edema. DP/PT/Radials 2+ and equal bilaterally.  Accessory Clinical Findings  Basic Metabolic Panel  Recent Labs  10/13/14 0314 10/14/14 0325  NA 140 140  K 3.8 4.0  CL 97* 98*  CO2 33* 33*  GLUCOSE 105* 88  BUN 22* 20  CREATININE 0.79 0.97  CALCIUM 8.8* 9.0    TELE A-fib with HR 80-120s    ECG  No new EKG  Echocardiogram  LV EF: 50% -  55%  ------------------------------------------------------------------- Indications:   Atrial fibrillation - 427.31.  ------------------------------------------------------------------- History:  Risk factors: Lower extremity edema. Hypertension. Diabetes mellitus.  ------------------------------------------------------------------- Study Conclusions  - Left ventricle: The cavity size was normal. Wall thickness was normal. Systolic function was normal. The estimated ejection fraction  was in the range of 50% to 55%. - Mitral valve: There was mild regurgitation. - Left atrium: The atrium was moderately dilated. - Right ventricle: The cavity size was moderately dilated. - Right atrium: The atrium was severely dilated. - Tricuspid valve: There was severe regurgitation. - Pericardium, extracardiac: Small posterior pericardial effusion    Radiology/Studies  Dg Chest Port 1 View  10/07/2014   CLINICAL DATA:  Shortness of breath for 1 week, began Cardizem 1 week ago for atrial fibrillation, tachycardia, addition history of diabetes mellitus, hypertension  EXAM: PORTABLE CHEST - 1 VIEW   COMPARISON:  Portable exam 1201 hours compared to 12/13/2012  FINDINGS: Enlargement of cardiac silhouette with pulmonary vascular congestion.  Atherosclerotic calcification aorta.  Mild chronic elevation of RIGHT diaphragm.  Minimal chronic pleural thickening RIGHT chest.  No acute infiltrate, pleural effusion or pneumothorax.  Bones demineralized.  IMPRESSION: Enlargement of cardiac silhouette with pulmonary vascular congestion.  No acute abnormalities.   Electronically Signed   By: Lavonia Dana M.D.   On: 10/07/2014 12:15    ASSESSMENT AND PLAN  1. Persistent atrial fibrillation now rate controlled, neg MI - failed cardioversion 10/08/14 - IV amiodarone started 10/08/14. - CHA2DS2-Vasc score 6 (female, age, HF, HTN, DM), started on pradaxa 150mg  BID 1 week prior to admission by PCP after being diagnosed with new a-fib - TSH stable. S/p negative myoview in 03/2014  2. Acute on chronic diastolic HF - EF [id number] on myoview in 03/2014  - nowEF 50% to 55%., small pericardial effusion mod lt. atrial enlargement -On  lasix 80mg  IV TID- wgt down from 188 to 174, I/O neg 7.5 L since admission  Plan: Cardioversion tomorrow (11 am Dr Marlou Porch), no TEE needed. Check CXR today to confirm resolution of CHF (hard to tell on exam), if so change Lasix to 40 mg po BID.    Angelena Form PA-C 10/14/2014 8:34 AM  Attending Note:   The patient was seen and examined.  Agree with assessment and plan as noted above.  Changes made to the above note as needed.  For repeat try at cardioversion tomorrow . She is feeling much better with the slower HR .    [name], Brooke Bonito., MD, [facility] 10/14/2014, 10:10 AM [address]  [place] Pager [phone]

## 2014-10-15 ENCOUNTER — Encounter (HOSPITAL_COMMUNITY)
Admission: EM | Disposition: A | Discharge status: Home / Self Care | Payer: Self-pay | Source: Home / Self Care | Attending: Cardiology

## 2014-10-15 ENCOUNTER — Inpatient Hospital Stay (HOSPITAL_COMMUNITY): Payer: Medicare Other | Admitting: *Deleted

## 2014-10-15 ENCOUNTER — Encounter (HOSPITAL_COMMUNITY): Payer: Self-pay | Admitting: Physician Assistant

## 2014-10-15 HISTORY — PX: CARDIOVERSION: SHX1299

## 2014-10-15 LAB — GLUCOSE, CAPILLARY
Glucose-Capillary: 146 mg/dL — ABNORMAL HIGH (ref 65–99)
Glucose-Capillary: 97 mg/dL (ref 65–99)
Glucose-Capillary: 159 mg/dL — ABNORMAL HIGH (ref 65–99)
Glucose-Capillary: 185 mg/dL — ABNORMAL HIGH (ref 65–99)

## 2014-10-15 LAB — BASIC METABOLIC PANEL
Anion gap: 10 (ref 5–15)
BUN: 24 mg/dL — ABNORMAL HIGH (ref 6–20)
CO2: 31 mmol/L (ref 22–32)
Calcium: 9.2 mg/dL (ref 8.9–10.3)
Chloride: 96 mmol/L — ABNORMAL LOW (ref 101–111)
Creatinine, Ser: 0.84 mg/dL (ref 0.44–1.00)
GFR calc Af Amer: >60 mL/min (ref >60)
GFR calc non Af Amer: >60 mL/min (ref >60)
Glucose, Bld: 140 mg/dL — ABNORMAL HIGH (ref 65–99)
Potassium: 3.9 mmol/L (ref 3.5–5.1)
Sodium: 137 mmol/L (ref 135–145)

## 2014-10-15 SURGERY — CARDIOVERSION
Anesthesia: General

## 2014-10-15 MED ORDER — FUROSEMIDE 80 MG PO TABS: 80.0000 mg | ORAL_TABLET | Freq: Two times a day (BID) | ORAL | Status: DC

## 2014-10-15 MED ORDER — LIDOCAINE HCL (CARDIAC) 20 MG/ML IV SOLN
INTRAVENOUS | Status: DC | PRN
  Administered 2014-10-15: 40 mg via INTRAVENOUS

## 2014-10-15 MED ORDER — PROPOFOL 10 MG/ML IV BOLUS
INTRAVENOUS | Status: DC | PRN
  Administered 2014-10-15: 60 mg via INTRAVENOUS
  Administered 2014-10-15: 30 mg via INTRAVENOUS

## 2014-10-15 MED ORDER — AMIODARONE HCL 200 MG PO TABS
200.0000 mg | ORAL_TABLET | Freq: Two times a day (BID) | ORAL | Status: DC
  Filled 2014-10-15: qty 1

## 2014-10-15 MED ORDER — SODIUM CHLORIDE 0.9 % IV SOLN
INTRAVENOUS | Status: DC | PRN
  Administered 2014-10-15: 10:00:00 via INTRAVENOUS

## 2014-10-15 MED ORDER — POTASSIUM CHLORIDE CRYS ER 20 MEQ PO TBCR: 20.0000 meq | EXTENDED_RELEASE_TABLET | Freq: Two times a day (BID) | ORAL | Status: DC

## 2014-10-15 MED ORDER — AMIODARONE HCL 200 MG PO TABS: ORAL_TABLET | ORAL | Status: DC

## 2014-10-15 MED ORDER — DILTIAZEM HCL ER COATED BEADS 240 MG PO CP24: 240.0000 mg | ORAL_CAPSULE | Freq: Every day | ORAL | Status: DC

## 2014-10-15 NOTE — Anesthesia Postprocedure Evaluation (Signed)
  Anesthesia Post-op Note  Patient: Ann Fletcher  Procedure(s) Performed: Procedure(s): CARDIOVERSION (N/A)  Patient Location: Endoscopy Unit  Anesthesia Type:General  Level of Consciousness: awake, alert  and patient cooperative  Airway and Oxygen Therapy: Patient Spontanous Breathing and Patient connected to nasal cannula oxygen  Post-op Pain: none  Post-op Assessment: Post-op Vital signs reviewed, Patient's Cardiovascular Status Stable, Respiratory Function Stable, Patent Airway, No signs of Nausea or vomiting and Pain level controlled              Post-op Vital Signs: Reviewed and stable  Last Vitals:  Filed Vitals:   10/15/14 1025  BP: 178/74  Pulse: 133  Temp: 36.7 C  Resp: 18    Complications: No apparent anesthesia complications

## 2014-10-15 NOTE — Progress Notes (Signed)
Reviewed discharge instructions with both patient and spouse. Follow up appointment set and acknowledged by both patient and husband. Discontinued telemetry and PIV.  Patient waiting ride. Will continue to monitor. Tresa Endo

## 2014-10-15 NOTE — Patient Outreach (Signed)
[facility]) Care Management  10/15/2014  MESHELLE DUNKLEE Jan 28, 1933 [id number]   Referral from Natividad Brood, RN, assigned Erenest Rasher, RN.  [name]. [place], [place] Management [place] Assistant Phone: [phone] Fax: [phone]

## 2014-10-15 NOTE — Consult Note (Addendum)
   [facility] CM Inpatient Consult   10/15/2014  LINZE TONTI September 08, 1932 [id number] Referral receive for community follow up and education. Patient evaluated for community based chronic disease management services with [place] [facility] as a benefit of patient's [facility] Medicare Insurance. Spoke with patient and husband, [name],  at bedside to explain [facility] Management services. Patient admits she would be interested in follow up because of new diagnosis of Atrial Fib with RVR and HF,  and to understand new medication. Consent form signed.  Patient will receive post discharge transition of care call and will be evaluated for monthly home visits for assessments and disease process education.  Left contact information and [facility] literature at bedside. Made Inpatient Case Manager aware that [facility] Management following. Of note, [facility] Care Management services does not replace or interfere with any services that are arranged by inpatient case management or social work.  For additional questions or referrals please contact:

## 2014-10-15 NOTE — Interval H&P Note (Signed)
History and Physical Interval Note:  10/15/2014 10:47 AM  Ann Fletcher  has presented today for surgery, with the diagnosis of AFIB  The various methods of treatment have been discussed with the patient and family. After consideration of risks, benefits and other options for treatment, the patient has consented to  Procedure(s): CARDIOVERSION (N/A) as a surgical intervention .  The patient's history has been reviewed, patient examined, no change in status, stable for surgery.  I have reviewed the patient's chart and labs.  Questions were answered to the patient's satisfaction.     [name]

## 2014-10-15 NOTE — Anesthesia Procedure Notes (Signed)
Performed by: Lalanya Rufener       

## 2014-10-15 NOTE — H&P (View-Only) (Signed)
Patient Name: Ann Fletcher Date of Encounter: 10/14/2014  Primary Cardiologist: [name]  Pt. Profile:  81 year old Caucasian female with past medical history of HTN, HLD, lower extremity edema, DM, and hypothyroidism. She was recently diagnosed with PAF and was placed on Pradaxa by her PCP. She presented to the ED 10/07/14 with a-fib with RVR and CHF.  She was admitted, started on Lasix, and her medications were adjusted for rate control. She failed cardioversion on 10/08/14 and was placed on Amiodarone.    Principal Problem:   Atrial fibrillation with RVR Active Problems:   Anticoagulated-Pradaxa   Essential hypertension   Diabetes   Hyperlipidemia   Lower extremity edema    SUBJECTIVE  Denies any CP, no significant SOB.   CURRENT MEDS . amiodarone  400 mg Oral BID  . atorvastatin  40 mg Oral q1800  . dabigatran  150 mg Oral BID  . diltiazem  240 mg Oral Daily  . furosemide  80 mg Intravenous TID  . glimepiride  4 mg Oral BID WC  . insulin aspart  0-5 Units Subcutaneous QHS  . insulin aspart  0-9 Units Subcutaneous TID WC  . levothyroxine  75 mcg Oral QAC breakfast  . LORazepam  1 mg Oral QHS  . metoprolol succinate  100 mg Oral Daily  . potassium chloride  20 mEq Oral TID  . sodium chloride  3 mL Intravenous Q12H  . vitamin E  400 Units Oral Daily    OBJECTIVE  Filed Vitals:   10/13/14 0621 10/13/14 1400 10/13/14 2250 10/14/14 0600  BP: 123/69 131/89 121/74 115/54  Pulse: 119 106 93 115  Temp: 97.8 F (36.6 C) 97.7 F (36.5 C) 98 F (36.7 C) 98.7 F (37.1 C)  TempSrc: Oral Oral Oral Oral  Resp: 18 18 17 18   Height:      Weight: 175 lb 14.4 oz (79.788 kg)   174 lb 8 oz (79.153 kg)  SpO2: 94% 97% 96% 94%    Intake/Output Summary (Last 24 hours) at 10/14/14 0834 Last data filed at 10/14/14 0744  Gross per 24 hour  Intake    800 ml  Output   1750 ml  Net   -950 ml   Filed Weights   10/12/14 0455 10/13/14 0621 10/14/14 0600  Weight: 182 lb 3.2 oz  (82.645 kg) 175 lb 14.4 oz (79.788 kg) 174 lb 8 oz (79.153 kg)    PHYSICAL EXAM  General: Pleasant, NAD, obese Neuro: Alert and oriented X 3. Moves all extremities spontaneously. Psych: Normal affect. HEENT:  Normal  Neck: Supple without bruits or JVD. Lungs:  Decreased breath sounds, crackles bilat bases Heart: irregular. no s3, s4, or murmurs. Abdomen: Soft, non-tender, non-distended, BS + x 4.  Extremities: No clubbing, cyanosis or edema. DP/PT/Radials 2+ and equal bilaterally.  Accessory Clinical Findings  Basic Metabolic Panel  Recent Labs  10/13/14 0314 10/14/14 0325  NA 140 140  K 3.8 4.0  CL 97* 98*  CO2 33* 33*  GLUCOSE 105* 88  BUN 22* 20  CREATININE 0.79 0.97  CALCIUM 8.8* 9.0    TELE A-fib with HR 80-120s    ECG  No new EKG  Echocardiogram  LV EF: 50% -  55%  ------------------------------------------------------------------- Indications:   Atrial fibrillation - 427.31.  ------------------------------------------------------------------- History:  Risk factors: Lower extremity edema. Hypertension. Diabetes mellitus.  ------------------------------------------------------------------- Study Conclusions  - Left ventricle: The cavity size was normal. Wall thickness was normal. Systolic function was normal. The estimated ejection fraction  was in the range of 50% to 55%. - Mitral valve: There was mild regurgitation. - Left atrium: The atrium was moderately dilated. - Right ventricle: The cavity size was moderately dilated. - Right atrium: The atrium was severely dilated. - Tricuspid valve: There was severe regurgitation. - Pericardium, extracardiac: Small posterior pericardial effusion    Radiology/Studies  Dg Chest Port 1 View  10/07/2014   CLINICAL DATA:  Shortness of breath for 1 week, began Cardizem 1 week ago for atrial fibrillation, tachycardia, addition history of diabetes mellitus, hypertension  EXAM: PORTABLE CHEST - 1 VIEW   COMPARISON:  Portable exam 1201 hours compared to 12/13/2012  FINDINGS: Enlargement of cardiac silhouette with pulmonary vascular congestion.  Atherosclerotic calcification aorta.  Mild chronic elevation of RIGHT diaphragm.  Minimal chronic pleural thickening RIGHT chest.  No acute infiltrate, pleural effusion or pneumothorax.  Bones demineralized.  IMPRESSION: Enlargement of cardiac silhouette with pulmonary vascular congestion.  No acute abnormalities.   Electronically Signed   By: Lavonia Dana M.D.   On: 10/07/2014 12:15    ASSESSMENT AND PLAN  1. Persistent atrial fibrillation now rate controlled, neg MI - failed cardioversion 10/08/14 - IV amiodarone started 10/08/14. - CHA2DS2-Vasc score 6 (female, age, HF, HTN, DM), started on pradaxa 150mg  BID 1 week prior to admission by PCP after being diagnosed with new a-fib - TSH stable. S/p negative myoview in 03/2014  2. Acute on chronic diastolic HF - EF A999333 on myoview in 03/2014  - nowEF 50% to 55%., small pericardial effusion mod lt. atrial enlargement -On  lasix 80mg  IV TID- wgt down from 188 to 174, I/O neg 7.5 L since admission  Plan: Cardioversion tomorrow (11 am Dr Marlou Porch), no TEE needed. Check CXR today to confirm resolution of CHF (hard to tell on exam), if so change Lasix to 40 mg po BID.    Angelena Form PA-C 10/14/2014 8:34 AM  Attending Note:   The patient was seen and examined.  Agree with assessment and plan as noted above.  Changes made to the above note as needed.  For repeat try at cardioversion tomorrow . She is feeling much better with the slower HR .    [name], Brooke Bonito., MD, [facility] 10/14/2014, 10:10 AM [address]  [place] Pager [phone]

## 2014-10-15 NOTE — Transfer of Care (Signed)
Immediate Anesthesia Transfer of Care Note  Patient: Ann Fletcher  Procedure(s) Performed: Procedure(s): CARDIOVERSION (N/A)  Patient Location: Endoscopy Unit  Anesthesia Type:General  Level of Consciousness: awake, alert , oriented and patient cooperative  Airway & Oxygen Therapy: Patient Spontanous Breathing and Patient connected to nasal cannula oxygen  Post-op Assessment: Report given to RN, Post -op Vital signs reviewed and stable and Patient moving all extremities  Post vital signs: Reviewed and stable  Last Vitals:  Filed Vitals:   10/15/14 1025  BP: 178/74  Pulse: 133  Temp: 36.7 C  Resp: 18    Complications: No apparent anesthesia complications

## 2014-10-15 NOTE — Progress Notes (Signed)
Pt requesting to be d/c to home.  Notified Oswaldo Done, PA, instructed that they emergency call and cardiologist will be up to talk to pt.  Pt and husband at bedside made aware.  Will continue to monitor.  Karie Kirks, Therapist, sports.

## 2014-10-15 NOTE — Discharge Summary (Signed)
Discharge Summary   Patient ID: Ann Fletcher,  MRN: [id number], DOB/AGE: 09/28/1932 81 y.o.  Admit date: 10/07/2014 Discharge date: 10/15/2014  Primary Care Provider: [facility] Primary Cardiologist: [name]  Discharge Diagnoses Principal Problem:   Atrial fibrillation with RVR Active Problems:   Anticoagulated-Pradaxa   Essential hypertension   Diabetes   Hyperlipidemia   Lower extremity edema   Allergies Allergies  Allergen Reactions  . Nsaids Other (See Comments)    Stomach cramps  . Betadine [Povidone Iodine] Itching    Procedures  Echocardiogram 10/07/2014 LV EF: 50% -  55%  ------------------------------------------------------------------- Indications:   Atrial fibrillation - 427.31.  ------------------------------------------------------------------- History:  Risk factors: Lower extremity edema. Hypertension. Diabetes mellitus.  ------------------------------------------------------------------- Study Conclusions  - Left ventricle: The cavity size was normal. Wall thickness was normal. Systolic function was normal. The estimated ejection fraction was in the range of 50% to 55%. - Mitral valve: There was mild regurgitation. - Left atrium: The atrium was moderately dilated. - Right ventricle: The cavity size was moderately dilated. - Right atrium: The atrium was severely dilated. - Tricuspid valve: There was severe regurgitation. - Pericardium, extracardiac: Small posterior pericardial effusion    TEE w/ DCCV 10/08/2014 The patient has been on adequate anticoagulation. The patient received IV propofol ( see above for dose ) for sedation. Synchronous cardioversion was performed at 120, 200, 200 joules.  The cardioversion was Unsuccessful.  She may need the addition of an antiarrhythmic Has significant respirator difficulty. Needs more diuresis.   Complications: No apparent complications Patient did tolerate procedure  well.  Left Ventrical: Normal LV function   Mitral Valve: mild - moderate MR   Aortic Valve: 3 leaflet valve ,   Tricuspid Valve: moderate TR   Pulmonic Valve: poorly visualized   Left Atrium/ Left atrial appendage: no thrombi, + spontaneous contrast   Atrial septum: no PFO or ASD by color flow   Aorta: not visualized      DCCV 10/15/2014 IMPRESSION:  Successful cardioversion of atrial fibrillation/flutter with transient periods of atrial tachycardia, PAC's. Hopefully, atria will stay organized. Continuing amiodarone. Discussed with family     Hospital Course  The patient is a 81 year old Caucasian female with past medical history of HTN, HLD, lower extremity edema, DM, hypothyroidism and recently diagnosed paroxysmal atrial fibrillation. She has been followed by [name] and was started on 40 mg daily of Lasix. The last time she saw [name] was in November 2015, at which time she was complaining of lower extremity edema. She was also noted to have a right carotid bruit, carotid ultrasound was done which showed only mild plaque bilaterally. She had nuclear stress test in December 2015 which was negative with EF 77%.  [facility] with atrial fibrillation with RVR. She was initially diagnosed with new atrial fibrillation by her PCP after presenting with shortness of breath the week prior to admission. Her CHA2DS2-Vasc score 6 (female, age, HF, HTN, DM). She was placed on Toprol-XL and Pradaxa along with 120mg  long-acting diltiazem. Unfortunately, despite good outpatient effort, patient's heart rate continued to be elevated. She was seen by cardiology service and was admitted for planned DC cardioversion. TSH was normal. Echocardiogram showed EF 50-55%, mild MR, severe TR, moderately dilated left atrium. On exam, she appeared fluid overloaded and was aggressively diuresed with IV diuresis. Her diltiazem was changed to IV form to allow for easier  titration which was eventually transition to 240mg  daily. She was continued on 100mg   daily Toprol XL. She underwent TEE cardioversion on 10/08/2014 and received 3 synchronous shocks which were unsuccessful in converting her. Post procedure, she was started on IV amiodarone with the intention of repeat DC cardioversion after loading. After 7 days of loading with amiodarone 400 mg twice a day, she underwent repeat DC cardioversion on 10/15/2014 under propofol. She was successfully converted from atrial fibrillation to atrial tachycardia with PACs after 3 attempts.  Post cardioversion, she was stable without significant discomfort or shortness of breath. Prior to discharge, her amiodarone has been decreased to 200 mg twice a day, she will continue on this dose one month before decreased down to 200 mg daily. Prior to the discharge, she did appears to have some irregular rhythm, a 12 lead EKG was done which showed she has reverted to atrial fubrilation. The plan is for rate control on the above medications including Amiodarone. Patient is deemed stable for discharge from cardiology perspective. I have arranged outpatient followup.  Of note, her admission weight is 188 lbs, her discharge weight on 7/15 is 171 lbs which will serve as her new baseline dry weight. She was diuresed >7L during this admission.  Discharge Vitals Blood pressure 122/72, pulse 95, temperature 98.2 F (36.8 C), temperature source Oral, resp. rate 20, height 5\' 3"  (1.6 m), weight 171 lb 11.2 oz (77.883 kg), SpO2 91 %.  Filed Weights   10/13/14 0621 10/14/14 0600 10/15/14 0348  Weight: 175 lb 14.4 oz (79.788 kg) 174 lb 8 oz (79.153 kg) 171 lb 11.2 oz (77.883 kg)    Labs  Basic Metabolic Panel  Recent Labs  10/14/14 0325 10/15/14 0331  NA 140 137  K 4.0 3.9  CL 98* 96*  CO2 33* 31  GLUCOSE 88 140*  BUN 20 24*  CREATININE 0.97 0.84  CALCIUM 9.0 9.2    Disposition  Pt is being discharged home today in good  condition.  Follow-up Plans & Appointments      Follow-up Information    Follow up with Quay Burow, MD.   Specialties:  Cardiology, Radiology   Why:  office will contact you for followup, please give [place] a call if you do not hear from [place] in 2 business days   Contact information:   [address] [place] [address] [phone]       Discharge Medications    Medication List    TAKE these medications        albuterol 108 (90 BASE) MCG/ACT inhaler  Commonly known as:  PROVENTIL HFA;VENTOLIN HFA  Inhale 2 puffs into the lungs 3 (three) times daily as needed for wheezing.     amiodarone 200 MG tablet  Commonly known as:  PACERONE  200mg  twice a day for 1 month, then decrease to 200mg  daily on 8/15     atorvastatin 40 MG tablet  Commonly known as:  LIPITOR  Take 40 mg by mouth daily.     diltiazem 240 MG 24 hr capsule  Commonly known as:  CARDIZEM CD  Take 1 capsule (240 mg total) by mouth daily.     furosemide 80 MG tablet  Commonly known as:  LASIX  Take 1 tablet (80 mg total) by mouth 2 (two) times daily.     glimepiride 4 MG tablet  Commonly known as:  AMARYL  Take 4 mg by mouth 2 (two) times daily with a meal.     levothyroxine 75 MCG tablet  Commonly known as:  SYNTHROID, LEVOTHROID  Take 75  mcg by mouth daily before breakfast.     LORazepam 1 MG tablet  Commonly known as:  ATIVAN  Take 1 mg by mouth at bedtime.     metoprolol succinate 100 MG 24 hr tablet  Commonly known as:  TOPROL-XL  Take 100 mg by mouth daily. Take with or immediately following a meal.     potassium chloride SA 20 MEQ tablet  Commonly known as:  K-DUR,KLOR-CON  Take 1 tablet (20 mEq total) by mouth 2 (two) times daily.     PRADAXA 150 MG Caps capsule  Generic drug:  dabigatran  Take 150 mg by mouth 2 (two) times daily.     vitamin E 400 UNIT capsule  Take 400 Units by mouth daily.        Duration of Discharge Encounter   Greater than 30 minutes  including physician time.  Henri Medal PA-C Pager: [phone] 10/15/2014, 6:43 PM

## 2014-10-15 NOTE — Progress Notes (Signed)
     ECG this evening shows atrial fib but rate is much better. I think she is stable for DC. Continue amio 200 bid several weeks then decrease to 200 a day  Continue pradaxa, cardizem, 240, toprol 100  Change lasix to 80 PO BID  kdur 20 bid To follow up with her primary cardiologist / TOC visit in a week or so     Kamdon Reisig, Wonda Cheng, MD  10/15/2014 5:21 PM    [place] [address],  [place], [place]  [address] Pager [phone] Phone: [phone]; Fax: [phone]   [facility]  [address] [place], [place]  [address] [phone]    Fax [phone] '

## 2014-10-15 NOTE — CV Procedure (Signed)
    Electrical Cardioversion Procedure Note Ann Fletcher [id number] [birth year]  Procedure: Electrical Cardioversion Indications:  Atrial Fibrillation  Time Out: Verified patient identification, verified procedure,medications/allergies/relevent history reviewed, required imaging and test results available.  Performed  Procedure Details  The patient was NPO after midnight. Anesthesia was administered at the beside  by Dr.Singer with  propofol.  Cardioversion was performed with synchronized biphasic defibrillation via AP pads with 120,150,200 joules.  3 attempt(s) were performed.  The patient converted to normal sinus rhythm, then had periods of atrial tachycardia, PAC's noted. The patient tolerated the procedure well   IMPRESSION:  Successful cardioversion of atrial fibrillation/flutter with transient periods of atrial tachycardia, PAC's. Hopefully, atria will stay organized. Continuing amiodarone. Discussed with family.   Candee Furbish, MD     Marlou Porch, [place] 10/15/2014, 11:06 AM

## 2014-10-15 NOTE — Progress Notes (Signed)
Patient Name: Ann Fletcher Date of Encounter: 10/15/2014  Primary Cardiologist: [name]  Pt. Profile:  81 year old Caucasian female with past medical history of HTN, HLD, lower extremity edema, DM, and hypothyroidism. She was recently diagnosed with PAF and was placed on Pradaxa by her PCP. She presented to the ED 10/07/14 with a-fib with RVR and CHF.  She was admitted, started on Lasix, and her medications were adjusted for rate control. She failed cardioversion on 10/08/14 and was placed on Amiodarone.  Had successful cardioversion this am  Still having some PACs     Principal Problem:   Atrial fibrillation with RVR Active Problems:   Essential hypertension   Diabetes   Hyperlipidemia   Lower extremity edema   Anticoagulated-Pradaxa    SUBJECTIVE  Denies any CP, no significant SOB.   CURRENT MEDS . amiodarone  400 mg Oral BID  . atorvastatin  40 mg Oral q1800  . dabigatran  150 mg Oral BID  . diltiazem  240 mg Oral Daily  . furosemide  80 mg Intravenous TID  . glimepiride  4 mg Oral BID WC  . insulin aspart  0-5 Units Subcutaneous QHS  . insulin aspart  0-9 Units Subcutaneous TID WC  . levothyroxine  75 mcg Oral QAC breakfast  . LORazepam  1 mg Oral QHS  . metoprolol succinate  100 mg Oral Daily  . potassium chloride  20 mEq Oral TID  . sodium chloride  3 mL Intravenous Q12H  . vitamin E  400 Units Oral Daily    OBJECTIVE  Filed Vitals:   10/15/14 1110 10/15/14 1115 10/15/14 1125 10/15/14 1224  BP: 123/66 135/104 165/76 130/79  Pulse: 97  106 98  Temp:      TempSrc:      Resp: 24  25   Height:      Weight:      SpO2: 99%  98%     Intake/Output Summary (Last 24 hours) at 10/15/14 1255 Last data filed at 10/15/14 1218  Gross per 24 hour  Intake   1123 ml  Output   1350 ml  Net   -227 ml   Filed Weights   10/13/14 0621 10/14/14 0600 10/15/14 0348  Weight: 79.788 kg (175 lb 14.4 oz) 79.153 kg (174 lb 8 oz) 77.883 kg (171 lb 11.2 oz)    PHYSICAL  EXAM  General: Pleasant, NAD, obese Neuro: Alert and oriented X 3. Moves all extremities spontaneously. Psych: Normal affect. HEENT:  Normal  Neck: Supple without bruits or JVD. Lungs:  Decreased breath sounds, crackles bilat bases Heart: RR with some PACs . no s3, s4, or murmurs. Abdomen: Soft, non-tender, non-distended, BS + x 4.  Extremities: No clubbing, cyanosis or edema. DP/PT/Radials 2+ and equal bilaterally.  Accessory Clinical Findings  Basic Metabolic Panel  Recent Labs  10/14/14 0325 10/15/14 0331  NA 140 137  K 4.0 3.9  CL 98* 96*  CO2 33* 31  GLUCOSE 88 140*  BUN 20 24*  CREATININE 0.97 0.84  CALCIUM 9.0 9.2    TELE A-fib with HR 80-120s    ECG  No new EKG  Echocardiogram  LV EF: 50% -  55%  ------------------------------------------------------------------- Indications:   Atrial fibrillation - 427.31.  ------------------------------------------------------------------- History:  Risk factors: Lower extremity edema. Hypertension. Diabetes mellitus.  ------------------------------------------------------------------- Study Conclusions  - Left ventricle: The cavity size was normal. Wall thickness was normal. Systolic function was normal. The estimated ejection fraction was in the range of 50% to  55%. - Mitral valve: There was mild regurgitation. - Left atrium: The atrium was moderately dilated. - Right ventricle: The cavity size was moderately dilated. - Right atrium: The atrium was severely dilated. - Tricuspid valve: There was severe regurgitation. - Pericardium, extracardiac: Small posterior pericardial effusion    Radiology/Studies  Dg Chest 2 View  10/14/2014   CLINICAL DATA:  One week history of shortness of breath  EXAM: CHEST  2 VIEW  COMPARISON:  October 07, 2014  FINDINGS: There is stable generalized cardiomegaly. The pulmonary vascularity is within normal limits. There is slight interstitial edema. No airspace  consolidation. No adenopathy. There is mild degenerative change in the thoracic spine.  IMPRESSION: Slight interstitial edema with cardiomegaly. These findings are indicative of a degree of persistent congestive heart failure. No airspace consolidation. No new opacity. No adenopathy apparent.   Electronically Signed   By: Lowella Grip III M.D.   On: 10/14/2014 14:58   Dg Chest Port 1 View  10/07/2014   CLINICAL DATA:  Shortness of breath for 1 week, began Cardizem 1 week ago for atrial fibrillation, tachycardia, addition history of diabetes mellitus, hypertension  EXAM: PORTABLE CHEST - 1 VIEW  COMPARISON:  Portable exam 1201 hours compared to 12/13/2012  FINDINGS: Enlargement of cardiac silhouette with pulmonary vascular congestion.  Atherosclerotic calcification aorta.  Mild chronic elevation of RIGHT diaphragm.  Minimal chronic pleural thickening RIGHT chest.  No acute infiltrate, pleural effusion or pneumothorax.  Bones demineralized.  IMPRESSION: Enlargement of cardiac silhouette with pulmonary vascular congestion.  No acute abnormalities.   Electronically Signed   By: Lavonia Dana M.D.   On: 10/07/2014 12:15    ASSESSMENT AND PLAN  1. Persistent atrial fibrillation now rate controlled, neg MI - failed cardioversion 10/08/14 - IV amiodarone started 10/08/14. - CHA2DS2-Vasc score 6 (female, age, HF, HTN, DM), started on pradaxa 150mg  BID 1 week prior to admission by PCP after being diagnosed with new a-fib - TSH stable. S/p negative myoview in 03/2014 S/p cardioversion  Continue amiodarone 200 BID for several weeks.  Then decrease to 200 a day .   2. Acute on chronic diastolic HF - EF [id number] on myoview in 03/2014  - nowEF 50% to 55%., small pericardial effusion mod lt. atrial enlargement -On  lasix 80mg  IV TID- wgt down from 188 to 174, I/O neg 7.5 L since admission  Follow up with [name]  .    Valori Hollenkamp, Wonda Cheng, MD  10/15/2014 12:58 PM    [place],  [place], [place]  [address] Pager [phone] Phone: [phone]; Fax: [phone]   [facility]  [address] [place], [place]  [address] [phone]    Fax [phone]

## 2014-10-15 NOTE — Anesthesia Preprocedure Evaluation (Addendum)
Anesthesia Evaluation  Patient identified by MRN, date of birth, ID band Patient awake    Reviewed: Allergy & Precautions, NPO status , Patient's Chart, lab work & pertinent test results, reviewed documented beta blocker date and time   Airway Mallampati: II       Dental  (+) Dental Advisory Given, Edentulous Upper, Edentulous Lower   Pulmonary shortness of breath,  Inhalers breath sounds clear to auscultation    - rales    Cardiovascular hypertension, Pt. on medications and Pt. on home beta blockers Rhythm:Irregular  ECHO EF 55% 10/2014   Neuro/Psych Carotids <49% stenosis Bilat 2015    GI/Hepatic negative GI ROS,   Endo/Other  diabetes, Type 2  Renal/GU      Musculoskeletal   Abdominal (+)  Abdomen: soft.    Peds  Hematology 11/35   Anesthesia Other Findings   Reproductive/Obstetrics                           Anesthesia Physical  Anesthesia Plan  ASA: III  Anesthesia Plan: General   Post-op Pain Management:    Induction: Intravenous  Airway Management Planned: Mask  Additional Equipment:   Intra-op Plan:   Post-operative Plan:   Informed Consent: I have reviewed the patients History and Physical, chart, labs and discussed the procedure including the risks, benefits and alternatives for the proposed anesthesia with the patient or authorized representative who has indicated his/her understanding and acceptance.   Dental advisory given  Plan Discussed with: CRNA, Anesthesiologist and Surgeon  Anesthesia Plan Comments:        Anesthesia Quick Evaluation

## 2014-10-18 ENCOUNTER — Telehealth: Payer: Self-pay | Admitting: Cardiovascular Disease

## 2014-10-18 ENCOUNTER — Encounter (HOSPITAL_COMMUNITY): Payer: Self-pay | Admitting: Cardiology

## 2014-10-18 ENCOUNTER — Telehealth: Payer: Self-pay | Admitting: Cardiology

## 2014-10-18 NOTE — Telephone Encounter (Signed)
Patient contacted regarding discharge from [facility] on 10/15/2014.  Patient understands to follow up with provider Melina Copa, PA on 10/29/2014 at 2:30p at [facility]. Patient understands discharge instructions? yes Patient understands medications and regiment? yes Patient understands to bring all medications to this visit? yes

## 2014-10-18 NOTE — Telephone Encounter (Signed)
Need a TOC phone call .[name] appt on 10/29/14 w/ Sharrell Ku

## 2014-10-19 NOTE — Telephone Encounter (Signed)
Closed encounter °

## 2014-10-20 ENCOUNTER — Other Ambulatory Visit: Payer: Self-pay

## 2014-10-20 NOTE — Patient Outreach (Signed)
Initial telephone contact for community car coordination. Patient identified herself using HIPPA identifiers. Patient was agreeable to this  initial telephone assessment of need for community resources.  In reviewing patient's medication, patient inform this RNCM she had taken the last sample she had of the medication Pradaxa.    Patient is currently receiving her  Medications through [employer], [address].  Call made to pharmacy, spoke to Audrea Muscat, Pharmacist.  Audrea Muscat informed this RNCM she  Had faxed an authorization form to this patient's primary care physician on June 30 th because the medication required prior authorization.  Call made to physician's office, [facility], to advise them of request sent by pharmacy one. Receptionist at [place] office informed this RNCM she would send doctor's nurse a message advising her of   fax that needed attention.    Patient called again to advise her of the above interventions.  Patient and RNCM agreed on home visit on July 26 to further assess need for community case management.

## 2014-10-26 ENCOUNTER — Other Ambulatory Visit: Payer: Self-pay

## 2014-10-26 NOTE — Patient Outreach (Signed)
[facility] Care Management  10/26/2014  Ann Fletcher [birth year] [id number]   This RNCM completed initial home visit with patient, in home aide and husband.  Patient states she is feeling okay, does not feel like she did when her heart was racing. Patient demonstrated a sound knowledge base with purpose of medications.  She was able to identify which pill is    Diabetes:  Patient monitors blood glucose daily, compliant with medication regimen.  Glucose readings range from 81 to 175  For the past 14 days.    Arrhythmias: patient is currently on Diltiazem, Pacerone and Toprol XL.  Patient has medication and states compliance with medication regimen.  Home Safety Assessment: Patient has in home aide service Monday through Sunday. Lives with husband in one bedroom, very neat apartment. Patient has not throw rugs, does have assistive devices in shower  chair, reacher).   Plan: Home visit next month (August 2016)  to review home safety, Advanced Directives and further assess needs for community care coordination

## 2014-10-29 ENCOUNTER — Ambulatory Visit (INDEPENDENT_AMBULATORY_CARE_PROVIDER_SITE_OTHER): Payer: Medicare Other | Admitting: Physician Assistant

## 2014-10-29 ENCOUNTER — Ambulatory Visit: Payer: Medicare Other | Admitting: Physician Assistant

## 2014-10-29 ENCOUNTER — Encounter: Payer: Self-pay | Admitting: Physician Assistant

## 2014-10-29 VITALS — BP 128/70 | HR 100 | Ht 63.0 in | Wt 169.7 lb

## 2014-10-29 DIAGNOSIS — I10 Essential (primary) hypertension: Secondary | ICD-10-CM

## 2014-10-29 DIAGNOSIS — D649 Anemia, unspecified: Secondary | ICD-10-CM

## 2014-10-29 DIAGNOSIS — I480 Paroxysmal atrial fibrillation: Secondary | ICD-10-CM | POA: Diagnosis not present

## 2014-10-29 DIAGNOSIS — I5032 Chronic diastolic (congestive) heart failure: Secondary | ICD-10-CM | POA: Diagnosis not present

## 2014-10-29 DIAGNOSIS — E039 Hypothyroidism, unspecified: Secondary | ICD-10-CM

## 2014-10-29 MED ORDER — DILTIAZEM HCL ER COATED BEADS 360 MG PO CP24: 360.0000 mg | ORAL_CAPSULE | Freq: Every day | ORAL | Status: DC

## 2014-10-29 NOTE — Patient Instructions (Signed)
You have been referred to [facility] for your a-fib.  Your physician has recommended you make the following change in your medication: A new prescription for diltiazem 360 mg has been sent to [facility]. This wil replace the other two bottles that you have.  Your physician recommends that you return for lab work today.

## 2014-10-29 NOTE — Progress Notes (Signed)
Cardiology Office Note Date:  10/29/2014  Patient ID:  Armande, Leyvas 1932/07/22, MRN [id number] PCP:  [name], MD  Cardiologist:  [name]   Chief Complaint: follow-up of atrial fib  History of Present Illness: Ann Fletcher is a 81 y.o. female with history of HTN, DM, HLD, lower extremity edema, hypothyroidism and recently diagnosed PAF/acute diastolic CHF who presents for post-hospital follow-up. She has h/o right carotid bruit with prior carotid US showing mild plaque only bilaterally. She had nuclear stress test in December 2015 which was negative with EF 77%. She was admitted to [facility] 7/7-7/15 with AF RVR. She had recently been diagnosed by PCP the week prior to admission after presenting with SOB. She was placed on Toprol and Pradaxa. Unfortunately, despite good outpatient effort, patient's heart rate continued to be elevated. She was seen by cardiology service and was admitted for planned TEE/DCCV. TSH and troponins were normal. Echocardiogram showed EF 50-55%, mild MR, severe TR, moderately dilated left atrium. On exam, she appeared fluid overloaded and was aggressively diuresed with IV diuresis. Her diltiazem was changed to IV form to allow for easier titration which was eventually transition to 240mg  daily. She was continued on 100mg  daily Toprol XL. She underwent TEE cardioversion on 10/08/2014 and received 3 synchronous shocks which were unsuccessful in converting her. Post procedure, she was started on IV amiodarone with the intention of repeat DC cardioversion after loading. After 7 days of loading with amiodarone 400 mg twice a day, she underwent repeat DC cardioversion on 10/15/2014 under propofol. She was successfully converted from atrial fibrillation to atrial tachycardia with PACs after 3 attempts. It appears the last note during her hospitalization indicates she was back in atrial fib but with lower rate prior to discharge. Amiodarone was decreased to 200mg  BID with  plans to reduce further to 200mg  daily at 1 month (~11/15/14). Discharge weight 171lbs (diuresed >7L during her admission). Of note CBC showed Hgb 11.0, microcytic with MCV 77.5. No recent LFTs.  She comes back into clinic today feeling well. She is still in afib, HR around 100. She denies any CP, SOB, palpitations, dizziness or syncope. Her LEE has stayed away. Baseline weight is hovering around 168lbs which she says is where she has been since discharge. She has 2 forms of diltiazem listed on her med list - 120mg  and 240mg  daily. She has these in her med bag and confirms she is taking both. She denies LEE, orthopnea, PND. She is following a low-salt diet. Denies any bleeding.   Past Medical History  Diagnosis Date  . Osteoporosis   . Hypothyroidism   . Hyperlipidemia   . Arthritis   . Hypertension   . Lower extremity edema   . Persistent atrial fibrillation     a. diagnosed in 10/2014 by PCP, underwent failed TEE DCCV on 10/08/14, loaded with amio, successfully DCCV on 10/15/14.  . Diabetes mellitus without complication     type 2  . Anxiety   . H/O bladder infections   . Chronic diastolic CHF (congestive heart failure)     a. Acute exacerbation occurred in setting of AF.    Past Surgical History  Procedure Laterality Date  . Back surgery  2001, 2003, 2006    x 3  . Replacement total knee Right 07/2008  . Tubal ligation    . Tee without cardioversion N/A 10/08/2014    Procedure: TRANSESOPHAGEAL ECHOCARDIOGRAM (TEE);  Surgeon: [name], MD;  Location: [place];  Service: Cardiovascular;  Laterality: N/A;  . Cardioversion N/A 10/08/2014    Procedure: CARDIOVERSION;  Surgeon: [name], MD;  Location: [place];  Service: Cardiovascular;  Laterality: N/A;  . Cardioversion N/A 10/15/2014    Procedure: CARDIOVERSION;  Surgeon: [name], MD;  Location: [facility];  Service: Cardiovascular;  Laterality: N/A;    Current Outpatient Prescriptions  Medication Sig Dispense  Refill  . albuterol (PROVENTIL HFA;VENTOLIN HFA) 108 (90 BASE) MCG/ACT inhaler Inhale 2 puffs into the lungs 3 (three) times daily as needed for wheezing.    [name] amiodarone (PACERONE) 200 MG tablet 200mg  twice a day for 1 month, then decrease to 200mg  daily on 8/15 60 tablet 3  . atorvastatin (LIPITOR) 40 MG tablet Take 40 mg by mouth daily.    . dabigatran (PRADAXA) 150 MG CAPS capsule Take 150 mg by mouth 2 (two) times daily.    [name] diltiazem (CARDIZEM CD) 120 MG 24 hr capsule Take 1 capsule by mouth daily.    [name] diltiazem (CARDIZEM CD) 240 MG 24 hr capsule Take 1 capsule (240 mg total) by mouth daily. 30 capsule 5  . furosemide (LASIX) 80 MG tablet Take 1 tablet (80 mg total) by mouth 2 (two) times daily. 60 tablet 11  . glimepiride (AMARYL) 4 MG tablet Take 4 mg by mouth 2 (two) times daily with a meal.    . levothyroxine (SYNTHROID, LEVOTHROID) 100 MCG tablet Take 1 tablet by mouth daily.    [name] LORazepam (ATIVAN) 1 MG tablet Take 1 mg by mouth at bedtime.     . metoprolol succinate (TOPROL-XL) 100 MG 24 hr tablet Take 100 mg by mouth daily. Take with or immediately following a meal.    . potassium chloride SA (K-DUR,KLOR-CON) 20 MEQ tablet Take 1 tablet (20 mEq total) by mouth 2 (two) times daily. 60 tablet 11  . vitamin E 400 UNIT capsule Take 400 Units by mouth daily.     No current facility-administered medications for this visit.    Allergies:   Nsaids; Sulfa antibiotics; and Betadine   Social History:  The patient  reports that she has never smoked. She has never used smokeless tobacco. She reports that she does not drink alcohol or use illicit drugs.   Family History:  The patient's family history includes Diabetes in her sister; Heart attack in her father; Pneumonia in her mother.  ROS:  Please see the history of present illness. All other systems are reviewed and otherwise negative.   PHYSICAL EXAM:  VS:  BP 128/70 mmHg  Pulse 100  Ht 5\' 3"  (1.6 m)  Wt 169 lb 11.2 oz (76.975 kg)   BMI 30.07 kg/m2 BMI: Body mass index is 30.07 kg/(m^2). Well nourished, well developed WF, in no acute distress. Kyphotic posture HEENT: normocephalic, atraumatic Neck: no JVD, carotid bruits or masses Cardiac:  normal S1, S2; Irregularly irregular, rate slightly elevated; no murmurs, rubs, or gallops Lungs:  clear to auscultation bilaterally, no wheezing, rhonchi or rales Abd: soft, nontender, no hepatomegaly, + BS MS: no deformity or atrophy Ext: no edema Skin: warm and dry, no rash Neuro:  moves all extremities spontaneously, no focal abnormalities noted, follows commands Psych: euthymic mood, full affect   EKG:  Done today shows atrial fib 100bpm, nonspecific ST-T changes  Recent Labs: 10/07/2014: B Natriuretic Peptide 365.9*; Hemoglobin 11.0*; Platelets 161 10/08/2014: TSH 1.026 10/09/2014: Magnesium 2.0 10/15/2014: BUN 24*; Creatinine, Ser 0.84; Potassium 3.9; Sodium 137  No results found for requested labs within last 365 days.  Estimated Creatinine Clearance: 52.5 mL/min (by C-G formula based on Cr of 0.84).   Wt Readings from Last 3 Encounters:  10/29/14 169 lb 11.2 oz (76.975 kg)  10/26/14 167 lb (75.751 kg)  10/15/14 171 lb 11.2 oz (77.883 kg)     Other studies reviewed: Additional studies/records reviewed today include: summarized above  ASSESSMENT AND PLAN:  1. Paroxysmal atrial fib, recently diagnosed s/p DCCVs/amiodarone - she remains in atrial fib today, rate borderline elevated. I discussed her case in depth with Dr. Ellyn Hack. She is already taking Toprol 100mg  daily, amiodarone 200mg  BID, and Diltiazem 360mg  daily (she was accidentally taking both 120mg  and 240mg ). She is completely asymptomatic and is euvolemic. Dr. Ellyn Hack advises to continue amiodarone load for now (200mg  BID). He recommends follow-up in the afib clinic to get their opinion on whether we should pursue a different rhythm control strategy, or if they feel comfortable pushing up her rate control  further since she is already on a high dose regimen. Continue anticoagulation. We have simplified her diltiazem prescription to a 360mg  capsule (and went over with her several times that she should only be taking one diltiazem capsule). If the decision is made to continue amiodarone long term, she will need baseline PFTs. Will check baseline hepatic function today. 2. Chronic diastolic CHF - appears euvolemic. F/u CMET today. 3. Essential HTN - controlled. 4. Hypothyroidism - TSH recently normal.  5. Anemia, microcytic - f/u CBC today. Denies bleeding.  Disposition: F/u in the afib clinic in 1-2 weeks.  Current medicines are reviewed at length with the patient today.  See above regarding med changes.  Raechel Ache PA-C 10/29/2014 3:16 PM     [facility] [address] [place], [place] [address] Phone: [phone]

## 2014-10-30 LAB — COMPREHENSIVE METABOLIC PANEL WITH GFR
ALT: 25 U/L (ref 6–29)
AST: 32 U/L (ref 10–35)
Albumin: 4.4 g/dL (ref 3.6–5.1)
Alkaline Phosphatase: 75 U/L (ref 33–130)
BUN: 25 mg/dL (ref 7–25)
CO2: 30 mmol/L (ref 20–31)
Calcium: 9.9 mg/dL (ref 8.6–10.4)
Chloride: 99 mmol/L (ref 98–110)
Creat: 1.15 mg/dL — ABNORMAL HIGH (ref 0.60–0.88)
Glucose, Bld: 131 mg/dL — ABNORMAL HIGH (ref 65–99)
Potassium: 4.2 mmol/L (ref 3.5–5.3)
Sodium: 142 mmol/L (ref 135–146)
Total Bilirubin: 0.6 mg/dL (ref 0.2–1.2)
Total Protein: 8 g/dL (ref 6.1–8.1)

## 2014-10-30 LAB — CBC
HCT: 39.1 % (ref 36.0–46.0)
Hemoglobin: 12.3 g/dL (ref 12.0–15.0)
MCH: 23.4 pg — ABNORMAL LOW (ref 26.0–34.0)
MCHC: 31.5 g/dL (ref 30.0–36.0)
MCV: 74.3 fL — ABNORMAL LOW (ref 78.0–100.0)
MPV: 10.3 fL (ref 8.6–12.4)
Platelets: 323 10*3/uL (ref 150–400)
RBC: 5.26 MIL/uL — ABNORMAL HIGH (ref 3.87–5.11)
RDW: 17.3 % — ABNORMAL HIGH (ref 11.5–15.5)
WBC: 10.9 10*3/uL — ABNORMAL HIGH (ref 4.0–10.5)

## 2014-11-01 ENCOUNTER — Telehealth: Payer: Self-pay

## 2014-11-01 DIAGNOSIS — R7989 Other specified abnormal findings of blood chemistry: Secondary | ICD-10-CM

## 2014-11-01 MED ORDER — POTASSIUM CHLORIDE CRYS ER 20 MEQ PO TBCR: 20.0000 meq | EXTENDED_RELEASE_TABLET | Freq: Every day | ORAL | Status: DC

## 2014-11-01 MED ORDER — FUROSEMIDE 80 MG PO TABS: ORAL_TABLET | ORAL | Status: DC

## 2014-11-01 NOTE — Telephone Encounter (Signed)
Called patient about lab results. Per Melina Copa PA, Kidney function (Cr) has bumped slightly. Please decrease Lasix to 1 tablet in the morning, 1/2 tablet in the evening (80mg  QAM/40mg  QPM). Decrease potassium to once daily. Can we see if we can coordinate a recheck BMET when she comes in for her appt with Roderic Palau? Thanks. She also has a mildly abnormal CBC and may need to follow up with primary care doctor to follow this - WBC mildly elevated, Hgb is OK but her MCV is low suggesting small cell size. Patient verbalized understanding.

## 2014-11-04 ENCOUNTER — Other Ambulatory Visit: Payer: Self-pay

## 2014-11-04 ENCOUNTER — Ambulatory Visit (HOSPITAL_COMMUNITY)
Admission: RE | Admit: 2014-11-04 | Discharge: 2014-11-04 | Disposition: A | Discharge status: Home / Self Care | Payer: Medicare Other | Source: Ambulatory Visit | Attending: Nurse Practitioner | Admitting: Nurse Practitioner

## 2014-11-04 VITALS — BP 114/64 | HR 97 | Ht 63.0 in | Wt 170.6 lb

## 2014-11-04 DIAGNOSIS — Z7902 Long term (current) use of antithrombotics/antiplatelets: Secondary | ICD-10-CM | POA: Diagnosis not present

## 2014-11-04 DIAGNOSIS — I10 Essential (primary) hypertension: Secondary | ICD-10-CM | POA: Diagnosis not present

## 2014-11-04 DIAGNOSIS — M810 Age-related osteoporosis without current pathological fracture: Secondary | ICD-10-CM | POA: Insufficient documentation

## 2014-11-04 DIAGNOSIS — I481 Persistent atrial fibrillation: Secondary | ICD-10-CM

## 2014-11-04 DIAGNOSIS — E039 Hypothyroidism, unspecified: Secondary | ICD-10-CM | POA: Insufficient documentation

## 2014-11-04 DIAGNOSIS — E119 Type 2 diabetes mellitus without complications: Secondary | ICD-10-CM | POA: Diagnosis not present

## 2014-11-04 DIAGNOSIS — E785 Hyperlipidemia, unspecified: Secondary | ICD-10-CM | POA: Diagnosis not present

## 2014-11-04 DIAGNOSIS — Z79899 Other long term (current) drug therapy: Secondary | ICD-10-CM | POA: Insufficient documentation

## 2014-11-04 DIAGNOSIS — Z833 Family history of diabetes mellitus: Secondary | ICD-10-CM | POA: Diagnosis not present

## 2014-11-04 DIAGNOSIS — Z8249 Family history of ischemic heart disease and other diseases of the circulatory system: Secondary | ICD-10-CM | POA: Insufficient documentation

## 2014-11-04 DIAGNOSIS — I5032 Chronic diastolic (congestive) heart failure: Secondary | ICD-10-CM | POA: Insufficient documentation

## 2014-11-04 DIAGNOSIS — I4819 Other persistent atrial fibrillation: Secondary | ICD-10-CM

## 2014-11-04 LAB — BASIC METABOLIC PANEL
Anion gap: 10 (ref 5–15)
BUN: 25 mg/dL — ABNORMAL HIGH (ref 6–20)
CO2: 27 mmol/L (ref 22–32)
Calcium: 10.1 mg/dL (ref 8.9–10.3)
Chloride: 102 mmol/L (ref 101–111)
Creatinine, Ser: 1.21 mg/dL — ABNORMAL HIGH (ref 0.44–1.00)
GFR calc Af Amer: 48 mL/min — ABNORMAL LOW (ref >60)
GFR calc non Af Amer: 41 mL/min — ABNORMAL LOW (ref >60)
Glucose, Bld: 206 mg/dL — ABNORMAL HIGH (ref 65–99)
Potassium: 4.5 mmol/L (ref 3.5–5.1)
Sodium: 139 mmol/L (ref 135–145)

## 2014-11-05 ENCOUNTER — Encounter (HOSPITAL_COMMUNITY): Payer: Self-pay | Admitting: Nurse Practitioner

## 2014-11-05 MED ORDER — FUROSEMIDE 80 MG PO TABS: 40.0000 mg | ORAL_TABLET | Freq: Every day | ORAL | Status: DC

## 2014-11-05 NOTE — Progress Notes (Signed)
Patient ID: Ann Fletcher, female   DOB: [birth year], 81 y.o.   MRN: [id number]     Primary Care Physician: [name], MD Referring Physician: Melina Copa, PA-C   [name] is a 81 y.o. female with a h/o HTN, DM, HLD, lower extremity edema, hypothyroidism and recently diagnosed PAF/acute diastolic CHF who presents for evaluation in the afib clinic. She has h/o right carotid bruit with prior carotid US showing mild plaque only bilaterally. She had nuclear stress test in December 2015 which was negative with EF 77%. She was admitted to [facility] 7/7-7/15 with AF RVR. She had recently been diagnosed by PCP the week prior to admission after presenting with SOB. She was placed on Toprol and Pradaxa. Unfortunately, despite good outpatient effort, patient's heart rate continued to be elevated. She was seen by cardiology service and was admitted for planned TEE/DCCV. TSH and troponins were normal. Echocardiogram showed EF 50-55%, mild MR, severe TR, moderately dilated left atrium. On exam, she appeared fluid overloaded and was aggressively diuresed with IV diuresis. Her diltiazem was changed to IV form to allow for easier titration which was eventually transition to 240mg  daily. She was continued on 100mg  daily Toprol XL. She underwent TEE cardioversion on 10/08/2014 and received 3 synchronous shocks which were unsuccessful in converting her. Post procedure, she was started on IV amiodarone with the intention of repeat DC cardioversion after loading. After 7 days of loading with amiodarone 400 mg twice a day, she underwent repeat DC cardioversion on 10/15/2014 under propofol. She was successfully converted from atrial fibrillation to atrial tachycardia with PACs after 3 attempts. It appears the last note during her hospitalization indicates she was back in atrial fib but with lower rate prior to discharge. Amiodarone was decreased to 200mg  BID with plans to reduce further to 200mg  daily at 1 month  (~11/15/14). Discharge weight 171lbs (diuresed >7L during her admission). Of note CBC showed Hgb 11.0, microcytic with MCV 77.5. No recent LFTs.  When last seen by [name], PA, she was feeling well. She is still in afib, HR around 100. She denies any CP, SOB, palpitations, dizziness or syncope. Her LEE has been minimal.. Baseline weight is stable.  She denies LEE, orthopnea, PND. She is following a low-salt diet. Denies any bleeding. EKg today with afib with v rate at 97 bpm, HTN, DM, HLD, lower extremity edema, hypothyroidism and recently diagnosed PAF/acute diastolic CHF who presents for post-hospital follow-up. She has h/o right carotid bruit with prior carotid US showing mild plaque only bilaterally. She had nuclear stress test in December 2015 which was negative with EF 77%. She was admitted to [facility] 7/7-7/15 with AF RVR. She had recently been diagnosed by PCP the week prior to admission after presenting with SOB. She was placed on Toprol and Pradaxa. Unfortunately, despite good outpatient effort, patient's heart rate continued to be elevated. She was seen by cardiology service and was admitted for planned TEE/DCCV. TSH and troponins were normal. Echocardiogram showed EF 50-55%, mild MR, severe TR, moderately dilated left atrium. On exam, she appeared fluid overloaded and was aggressively diuresed with IV diuresis. Her diltiazem was changed to IV form to allow for easier titration which was eventually transition to 240mg  daily. She was continued on 100mg  daily Toprol XL. She underwent TEE cardioversion on 10/08/2014 and received 3 synchronous shocks which were unsuccessful in converting her. Post procedure, she was started on IV amiodarone with the intention of repeat DC cardioversion after loading. After 7 days  of loading with amiodarone 400 mg twice a day, she underwent repeat DC cardioversion on 10/15/2014 under propofol. She was successfully converted from atrial fibrillation to atrial tachycardia with  PACs after 3 attempts. It appears the last note during her hospitalization indicates she was back in atrial fib but with lower rate prior to discharge. Amiodarone was decreased to 200mg  BID with plans to reduce further to 200mg  daily at 1 month (~11/15/14). Discharge weight 171lbs (diuresed >7L during her admission). Of note CBC showed Hgb 11.0, microcytic with MCV 77.5. No recent LFTs.  Today, comes back into clinic today feeling well. She is still in afib, HR around 100. She denies any CP, SOB, palpitations, dizziness or syncope. Her LEE has stayed away. Baseline weight is hovering around 168lbs which she says is where she has been since discharge. She has 2 forms of diltiazem listed on her med list - 120mg  and 240mg  daily. She has these in her med bag and confirms she is taking both. She denies LEE, orthopnea, PND. She is following a low-salt diet. Denies any bleeding.  Today, she denies symptoms of palpitations, chest pain, shortness of breath, orthopnea, PND, lower extremity edema, dizziness, presyncope, syncope, or neurologic sequela. The patient is tolerating medications without difficulties and is otherwise without complaint today.   Past Medical History  Diagnosis Date  . Osteoporosis   . Hypothyroidism   . Hyperlipidemia   . Arthritis   . Hypertension   . Lower extremity edema   . Persistent atrial fibrillation     a. diagnosed in 10/2014 by PCP, underwent failed TEE DCCV on 10/08/14, loaded with amio, successfully DCCV on 10/15/14.  . Diabetes mellitus without complication     type 2  . Anxiety   . H/O bladder infections   . Chronic diastolic CHF (congestive heart failure)     a. Acute exacerbation occurred in setting of AF.   Past Surgical History  Procedure Laterality Date  . Back surgery  2001, 2003, 2006    x 3  . Replacement total knee Right 07/2008  . Tubal ligation    . Tee without cardioversion N/A 10/08/2014    Procedure: TRANSESOPHAGEAL ECHOCARDIOGRAM (TEE);  Surgeon:  [name], MD;  Location: [place];  Service: Cardiovascular;  Laterality: N/A;  . Cardioversion N/A 10/08/2014    Procedure: CARDIOVERSION;  Surgeon: [name], MD;  Location: [place];  Service: Cardiovascular;  Laterality: N/A;  . Cardioversion N/A 10/15/2014    Procedure: CARDIOVERSION;  Surgeon: [name], MD;  Location: [facility] ENDOSCOPY;  Service: Cardiovascular;  Laterality: N/A;    Current Outpatient Prescriptions  Medication Sig Dispense Refill  . albuterol (PROVENTIL HFA;VENTOLIN HFA) 108 (90 BASE) MCG/ACT inhaler Inhale 2 puffs into the lungs 3 (three) times daily as needed for wheezing.    [name] amiodarone (PACERONE) 200 MG tablet 200mg  twice a day for 1 month, then decrease to 200mg  daily on 8/15 60 tablet 3  . atorvastatin (LIPITOR) 40 MG tablet Take 40 mg by mouth daily.    . dabigatran (PRADAXA) 150 MG CAPS capsule Take 150 mg by mouth 2 (two) times daily.    [name] diltiazem (CARDIZEM CD) 360 MG 24 hr capsule Take 1 capsule (360 mg total) by mouth daily. 30 capsule 11  . furosemide (LASIX) 80 MG tablet Take 0.5 tablets (40 mg total) by mouth daily. 60 tablet 11  . glimepiride (AMARYL) 4 MG tablet Take 4 mg by mouth 2 (two) times daily with a meal.    .  levothyroxine (SYNTHROID, LEVOTHROID) 100 MCG tablet Take 1 tablet by mouth daily.    [name] LORazepam (ATIVAN) 1 MG tablet Take 1 mg by mouth at bedtime.     . metoprolol succinate (TOPROL-XL) 100 MG 24 hr tablet Take 100 mg by mouth daily. Take with or immediately following a meal.    . potassium chloride SA (K-DUR,KLOR-CON) 20 MEQ tablet Take 1 tablet (20 mEq total) by mouth daily. 60 tablet 11  . vitamin E 400 UNIT capsule Take 400 Units by mouth daily.     No current facility-administered medications for this encounter.    Allergies  Allergen Reactions  . Nsaids Other (See Comments)    Stomach cramps  . Sulfa Antibiotics Nausea And Vomiting and Other (See Comments)    GI upset  . Betadine [Povidone Iodine]  Itching    History   Social History  . Marital Status: Married    Spouse Name: N/A  . Number of Children: N/A  . Years of Education: N/A   Occupational History  . Not on file.   Social History Main Topics  . Smoking status: Never Smoker   . Smokeless tobacco: Never Used  . Alcohol Use: No  . Drug Use: No  . Sexual Activity: Yes    Birth Control/ Protection: Surgical   Other Topics Concern  . Not on file   Social History Narrative    Family History  Problem Relation Age of Onset  . Pneumonia Mother   . Heart attack Father   . Diabetes Sister     ROS- All systems are reviewed and negative except as per the HPI above  Physical Exam: Filed Vitals:   11/04/14 1337  BP: 114/64  Pulse: 97  Height: 5\' 3"  (1.6 m)  Weight: 170 lb 9.6 oz (77.384 kg)    GEN- The patient is well appearing, alert and oriented x 3 today.   Head- normocephalic, atraumatic Eyes-  Sclera clear, conjunctiva pink Ears- hearing intact Oropharynx- clear Neck- supple, no JVP Lymph- no cervical lymphadenopathy Lungs- Clear to ausculation bilaterally, normal work of breathing Heart- Regular rate and rhythm, no murmurs, rubs or gallops, PMI not laterally displaced GI- soft, NT, ND, + BS Extremities- no clubbing, cyanosis, or edema MS- no significant deformity or atrophy Skin- no rash or lesion Psych- euthymic mood, full affect Neuro- strength and sensation are intact  EKG- afib at 97 bpm.QRS 94 ms, QTc 492 ms  Assessment and Plan:  1. Persistent afib Failed cardioversion x 2 Continue amiodarone at 200 mg bid  Toprol 100 mg daily/diltiazem 360 mg daily for additional rate control Rate controlled today Spoke to Dr. [name] and he believes one aditional DCCV may be reasonable since she now has had a longer load  If  DCCV is unsuccessful may leave on low dose amiodarone to rate control but he does not think we need to aggressively seek SR, if clinically she is stable and minimally  symptomatic. Continue Pradaxa without any missed doses.  2. Chronic diastolic CHF Stable  bmet today  3.HTN Controlled  F/u next week and set up for  [place]. Ann Fletcher, [facility] [address] [place], [place] [address] [phone]

## 2014-11-11 ENCOUNTER — Ambulatory Visit (HOSPITAL_COMMUNITY)
Admission: RE | Admit: 2014-11-11 | Discharge: 2014-11-11 | Disposition: A | Discharge status: Home / Self Care | Payer: Medicare Other | Source: Ambulatory Visit | Attending: Nurse Practitioner | Admitting: Nurse Practitioner

## 2014-11-11 VITALS — BP 120/60 | HR 103 | Ht 63.0 in | Wt 170.0 lb

## 2014-11-11 DIAGNOSIS — E039 Hypothyroidism, unspecified: Secondary | ICD-10-CM | POA: Diagnosis not present

## 2014-11-11 DIAGNOSIS — Z882 Allergy status to sulfonamides status: Secondary | ICD-10-CM | POA: Diagnosis not present

## 2014-11-11 DIAGNOSIS — M810 Age-related osteoporosis without current pathological fracture: Secondary | ICD-10-CM | POA: Diagnosis not present

## 2014-11-11 DIAGNOSIS — E119 Type 2 diabetes mellitus without complications: Secondary | ICD-10-CM | POA: Diagnosis not present

## 2014-11-11 DIAGNOSIS — I10 Essential (primary) hypertension: Secondary | ICD-10-CM | POA: Diagnosis not present

## 2014-11-11 DIAGNOSIS — Z7902 Long term (current) use of antithrombotics/antiplatelets: Secondary | ICD-10-CM | POA: Insufficient documentation

## 2014-11-11 DIAGNOSIS — Z8249 Family history of ischemic heart disease and other diseases of the circulatory system: Secondary | ICD-10-CM | POA: Insufficient documentation

## 2014-11-11 DIAGNOSIS — Z79899 Other long term (current) drug therapy: Secondary | ICD-10-CM | POA: Diagnosis not present

## 2014-11-11 DIAGNOSIS — Z833 Family history of diabetes mellitus: Secondary | ICD-10-CM | POA: Insufficient documentation

## 2014-11-11 DIAGNOSIS — I5032 Chronic diastolic (congestive) heart failure: Secondary | ICD-10-CM | POA: Insufficient documentation

## 2014-11-11 DIAGNOSIS — E785 Hyperlipidemia, unspecified: Secondary | ICD-10-CM | POA: Diagnosis not present

## 2014-11-11 DIAGNOSIS — I481 Persistent atrial fibrillation: Secondary | ICD-10-CM | POA: Insufficient documentation

## 2014-11-11 DIAGNOSIS — I4819 Other persistent atrial fibrillation: Secondary | ICD-10-CM

## 2014-11-11 LAB — BASIC METABOLIC PANEL
Anion gap: 8 (ref 5–15)
BUN: 23 mg/dL — ABNORMAL HIGH (ref 6–20)
CO2: 29 mmol/L (ref 22–32)
Calcium: 9.8 mg/dL (ref 8.9–10.3)
Chloride: 104 mmol/L (ref 101–111)
Creatinine, Ser: 1.16 mg/dL — ABNORMAL HIGH (ref 0.44–1.00)
GFR calc Af Amer: 50 mL/min — ABNORMAL LOW (ref >60)
GFR calc non Af Amer: 43 mL/min — ABNORMAL LOW (ref >60)
Glucose, Bld: 154 mg/dL — ABNORMAL HIGH (ref 65–99)
Potassium: 5.1 mmol/L (ref 3.5–5.1)
Sodium: 141 mmol/L (ref 135–145)

## 2014-11-11 LAB — CBC
HCT: 37.9 % (ref 36.0–46.0)
Hemoglobin: 11.7 g/dL — ABNORMAL LOW (ref 12.0–15.0)
MCH: 24.3 pg — ABNORMAL LOW (ref 26.0–34.0)
MCHC: 30.9 g/dL (ref 30.0–36.0)
MCV: 78.6 fL (ref 78.0–100.0)
Platelets: 172 10*3/uL (ref 150–400)
RBC: 4.82 MIL/uL (ref 3.87–5.11)
RDW: 19 % — ABNORMAL HIGH (ref 11.5–15.5)
WBC: 7.9 10*3/uL (ref 4.0–10.5)

## 2014-11-11 MED ORDER — AMIODARONE HCL 200 MG PO TABS: ORAL_TABLET | ORAL | Status: DC

## 2014-11-11 NOTE — Patient Instructions (Addendum)
Cardioversion scheduled for Monday, August 15th  - Arrive at the [facility] and go to admitting at 1:00PM  -Do not eat or drink anything after midnight the night prior to your procedure.  - Take all your medication with a sip of water prior to arrival except for you diabetes medication (amaryl)  - You will not be able to drive home after your procedure.  Continue taking amiodarone 200mg  twice a day until after follow up with Roderic Palau, NP on 11/23/2014 @ 1:30pm

## 2014-11-12 ENCOUNTER — Encounter (HOSPITAL_COMMUNITY): Payer: Self-pay | Admitting: Nurse Practitioner

## 2014-11-12 NOTE — Progress Notes (Signed)
Patient ID: Ann Fletcher, female   DOB: 1932-06-06, 81 y.o.   MRN: [id number]      Primary Care Physician: [name], MD Referring Physician: Melina Copa, PA-C   Ann Fletcher is a 81 y.o. female with a h/o HTN, DM, HLD, lower extremity edema, hypothyroidism and recently diagnosed PAF/acute diastolic CHF who presents for evaluation in the afib clinic. She has h/o right carotid bruit with prior carotid US showing mild plaque only bilaterally. She had nuclear stress test in December 2015 which was negative with EF 77%. She was admitted to [facility] 7/7-7/15 with AF RVR. She had recently been diagnosed by PCP the week prior to admission after presenting with SOB. She was placed on Toprol and Pradaxa. Unfortunately, despite good outpatient effort, patient's heart rate continued to be elevated. She was seen by cardiology service and was admitted for planned TEE/DCCV. TSH and troponins were normal. Echocardiogram showed EF 50-55%, mild MR, severe TR, moderately dilated left atrium. On exam, she appeared fluid overloaded and was aggressively diuresed with IV diuresis. Her diltiazem was changed to IV form to allow for easier titration which was eventually transition to 240mg  daily. She was continued on 100mg  daily Toprol XL. She underwent TEE cardioversion on 10/08/2014 and received 3 synchronous shocks which were unsuccessful in converting her. Post procedure, she was started on IV amiodarone with the intention of repeat DC cardioversion after loading. After 7 days of loading with amiodarone 400 mg twice a day, she underwent repeat DC cardioversion on 10/15/2014 under propofol. She was successfully converted from atrial fibrillation to atrial tachycardia with PACs after 3 attempts. It appears the last note during her hospitalization indicates she was back in atrial fib but with lower rate prior to discharge. Amiodarone was decreased to 200mg  BID with plans to reduce further to 200mg  daily at 1 month  (~11/15/14). Discharge weight 171lbs (diuresed >7L during her admission). Of note CBC showed Hgb 11.0, microcytic with MCV 77.5. No recent LFTs.  When last seen by [name], PA, 7/29, she was feeling well. She is still in afib, HR around 100. She denies any CP, SOB, palpitations, dizziness or syncope. Her LEE has been minimal.. Baseline weight is stable.  She denies LEE, orthopnea, PND. She is following a low-salt diet. Denies any bleeding. EKg today with afib with v rate at 97 bpm, HTN, DM, HLD, lower extremity edema, hypothyroidism and recently diagnosed PAF/acute diastolic CHF who presents for post-hospital follow-up. She has h/o right carotid bruit with prior carotid US showing mild plaque only bilaterally. She had nuclear stress test in December 2015 which was negative with EF 77%. She was admitted to [facility] 7/7-7/15 with AF RVR. She had recently been diagnosed by PCP the week prior to admission after presenting with SOB. She was placed on Toprol and Pradaxa. Unfortunately, despite good outpatient effort, patient's heart rate continued to be elevated. She was seen by cardiology service and was admitted for planned TEE/DCCV. TSH and troponins were normal. Echocardiogram showed EF 50-55%, mild MR, severe TR, moderately dilated left atrium. On exam, she appeared fluid overloaded and was aggressively diuresed with IV diuresis. Her diltiazem was changed to IV form to allow for easier titration which was eventually transition to 240mg  daily. She was continued on 100mg  daily Toprol XL. She underwent TEE cardioversion on 10/08/2014 and received 3 synchronous shocks which were unsuccessful in converting her. Post procedure, she was started on IV amiodarone with the intention of repeat DC cardioversion after loading. After  7 days of loading with amiodarone 400 mg twice a day, she underwent repeat DC cardioversion on 10/15/2014 under propofol. She was successfully converted from atrial fibrillation to atrial tachycardia  with PACs after 3 attempts. It appears the last note during her hospitalization indicates she was back in atrial fib but with lower rate prior to discharge. Amiodarone was decreased to 200mg  BID with plans to reduce further to 200mg  daily at 1 month (~11/15/14). Discharge weight 171lbs (diuresed >7L during her admission). Of note CBC showed Hgb 11.0, microcytic with MCV 77.5. No recent LFTs.  When initially seen in afib clinic, 8/5, she was still  in afib, HR around 100. She denies any CP, SOB, palpitations, dizziness or syncope. Her LEE is minimal. Baseline weight is hovering around 168lbs which she says is where she has been since discharge. Bmet was repeated and showed increase in creat and bun. She was asked to reduce lasix to 1/2 tab of 80 mg lasix daily. She denies LEE, orthopnea, PND. She is following a low-salt diet. Denies any bleeding. I discussed with Dr. [name] and he suggested since she has been on amiodarone now x one month that it would be reasonable to try one more DCCV. If she does not stay in SR, then rate control with low dose amiodarone may be needed for rate control.  Today, she denies symptoms of palpitations, chest pain, shortness of breath, orthopnea, PND, lower extremity edema, dizziness, presyncope, syncope, or neurologic sequela. The patient is tolerating medications without difficulties and is otherwise without complaint today.   Past Medical History  Diagnosis Date  . Osteoporosis   . Hypothyroidism   . Hyperlipidemia   . Arthritis   . Hypertension   . Lower extremity edema   . Persistent atrial fibrillation     a. diagnosed in 10/2014 by PCP, underwent failed TEE DCCV on 10/08/14, loaded with amio, successfully DCCV on 10/15/14.  . Diabetes mellitus without complication     type 2  . Anxiety   . H/O bladder infections   . Chronic diastolic CHF (congestive heart failure)     a. Acute exacerbation occurred in setting of AF.   Past Surgical History  Procedure Laterality  Date  . Back surgery  2001, 2003, 2006    x 3  . Replacement total knee Right 07/2008  . Tubal ligation    . Tee without cardioversion N/A 10/08/2014    Procedure: TRANSESOPHAGEAL ECHOCARDIOGRAM (TEE);  Surgeon: [name], MD;  Location: [place];  Service: Cardiovascular;  Laterality: N/A;  . Cardioversion N/A 10/08/2014    Procedure: CARDIOVERSION;  Surgeon: [name], MD;  Location: [place];  Service: Cardiovascular;  Laterality: N/A;  . Cardioversion N/A 10/15/2014    Procedure: CARDIOVERSION;  Surgeon: [name], MD;  Location: [facility] ENDOSCOPY;  Service: Cardiovascular;  Laterality: N/A;    Current Outpatient Prescriptions  Medication Sig Dispense Refill  . albuterol (PROVENTIL HFA;VENTOLIN HFA) 108 (90 BASE) MCG/ACT inhaler Inhale 2 puffs into the lungs 3 (three) times daily as needed for wheezing.    [name] amiodarone (PACERONE) 200 MG tablet 200mg  twice a day for 1 month, then decrease to 200mg  daily on 8/23 60 tablet 3  . atorvastatin (LIPITOR) 40 MG tablet Take 40 mg by mouth daily.    . dabigatran (PRADAXA) 150 MG CAPS capsule Take 150 mg by mouth 2 (two) times daily.    [name] diltiazem (CARDIZEM CD) 360 MG 24 hr capsule Take 1 capsule (360 mg total) by  mouth daily. 30 capsule 11  . fluticasone (FLONASE) 50 MCG/ACT nasal spray Place 1 spray into both nostrils as needed.    . furosemide (LASIX) 80 MG tablet Take 0.5 tablets (40 mg total) by mouth daily. 60 tablet 11  . glimepiride (AMARYL) 4 MG tablet Take 4 mg by mouth 2 (two) times daily with a meal.    . levothyroxine (SYNTHROID, LEVOTHROID) 100 MCG tablet Take 1 tablet by mouth daily.    [name] LORazepam (ATIVAN) 1 MG tablet Take 1 mg by mouth at bedtime.     . metoprolol succinate (TOPROL-XL) 100 MG 24 hr tablet Take 100 mg by mouth daily. Take with or immediately following a meal.    . potassium chloride SA (K-DUR,KLOR-CON) 20 MEQ tablet Take 1 tablet (20 mEq total) by mouth daily. 60 tablet 11  . vitamin E 400 UNIT  capsule Take 400 Units by mouth daily.     No current facility-administered medications for this encounter.    Allergies  Allergen Reactions  . Nsaids Other (See Comments)    Stomach cramps  . Sulfa Antibiotics Nausea And Vomiting and Other (See Comments)    GI upset  . Betadine [Povidone Iodine] Itching    Social History   Social History  . Marital Status: Married    Spouse Name: N/A  . Number of Children: N/A  . Years of Education: N/A   Occupational History  . Not on file.   Social History Main Topics  . Smoking status: Never Smoker   . Smokeless tobacco: Never Used  . Alcohol Use: No  . Drug Use: No  . Sexual Activity: Yes    Birth Control/ Protection: Surgical   Other Topics Concern  . Not on file   Social History Narrative    Family History  Problem Relation Age of Onset  . Pneumonia Mother   . Heart attack Father   . Diabetes Sister     ROS- All systems are reviewed and negative except as per the HPI above  Physical Exam: Filed Vitals:   11/11/14 1335  BP: 120/60  Pulse: 103  Height: 5\' 3"  (1.6 m)  Weight: 170 lb (77.111 kg)    GEN- The patient is well appearing, alert and oriented x 3 today.   Head- normocephalic, atraumatic Eyes-  Sclera clear, conjunctiva pink Ears- hearing intact Oropharynx- clear Neck- supple, no JVP Lymph- no cervical lymphadenopathy Lungs- Clear to ausculation bilaterally, normal work of breathing Heart- Regular rate and rhythm, no murmurs, rubs or gallops, PMI not laterally displaced GI- soft, NT, ND, + BS Extremities- no clubbing, cyanosis, or edema MS- no significant deformity or atrophy Skin- no rash or lesion Psych- euthymic mood, full affect Neuro- strength and sensation are intact  EKG- afib at 103 bpm,QR 96 ms, QTc 387 ms.  Assessment and Plan:  1. Persistent afib Failed cardioversion x 2 Will attempt one further DCCV since she has been on amiodarone x one month Continue amiodarone at 200 mg bid,  will reduce to 200 mg qd when seen f/u cardioversion. Toprol 100 mg daily/diltiazem 360 mg daily for additional rate control Reasonably rate controlled today If  DCCV is unsuccessful may leave on low dose amiodarone to rate control but  Dr.  [name] does not think we need to aggressively seek SR, if clinically she is stable and minimally symptomatic. Continue Pradaxa without any reported missed doses. Pre procedure labs today  2. Chronic diastolic CHF Stable  Bmet pending  3.HTN Controlled  Geroge Baseman Brenda Samano, [facility] [address] [place],  [address] [phone]

## 2014-11-15 ENCOUNTER — Encounter (HOSPITAL_COMMUNITY)
Admission: RE | Disposition: A | Discharge status: Home / Self Care | Payer: Self-pay | Source: Ambulatory Visit | Attending: Cardiology

## 2014-11-15 ENCOUNTER — Ambulatory Visit (HOSPITAL_COMMUNITY)
Admission: RE | Admit: 2014-11-15 | Discharge: 2014-11-15 | Disposition: A | Discharge status: Home / Self Care | Payer: Medicare Other | Source: Ambulatory Visit | Attending: Cardiology | Admitting: Cardiology

## 2014-11-15 ENCOUNTER — Encounter (HOSPITAL_COMMUNITY): Payer: Self-pay | Admitting: *Deleted

## 2014-11-15 ENCOUNTER — Ambulatory Visit (HOSPITAL_COMMUNITY): Payer: Medicare Other | Admitting: Anesthesiology

## 2014-11-15 DIAGNOSIS — Z7901 Long term (current) use of anticoagulants: Secondary | ICD-10-CM | POA: Insufficient documentation

## 2014-11-15 DIAGNOSIS — Z96651 Presence of right artificial knee joint: Secondary | ICD-10-CM | POA: Diagnosis not present

## 2014-11-15 DIAGNOSIS — Z79899 Other long term (current) drug therapy: Secondary | ICD-10-CM | POA: Insufficient documentation

## 2014-11-15 DIAGNOSIS — I481 Persistent atrial fibrillation: Secondary | ICD-10-CM | POA: Diagnosis not present

## 2014-11-15 DIAGNOSIS — E785 Hyperlipidemia, unspecified: Secondary | ICD-10-CM | POA: Insufficient documentation

## 2014-11-15 DIAGNOSIS — I5032 Chronic diastolic (congestive) heart failure: Secondary | ICD-10-CM | POA: Insufficient documentation

## 2014-11-15 DIAGNOSIS — F419 Anxiety disorder, unspecified: Secondary | ICD-10-CM | POA: Insufficient documentation

## 2014-11-15 DIAGNOSIS — I10 Essential (primary) hypertension: Secondary | ICD-10-CM | POA: Diagnosis not present

## 2014-11-15 DIAGNOSIS — I4891 Unspecified atrial fibrillation: Secondary | ICD-10-CM | POA: Diagnosis not present

## 2014-11-15 DIAGNOSIS — E119 Type 2 diabetes mellitus without complications: Secondary | ICD-10-CM | POA: Insufficient documentation

## 2014-11-15 DIAGNOSIS — I480 Paroxysmal atrial fibrillation: Secondary | ICD-10-CM | POA: Insufficient documentation

## 2014-11-15 DIAGNOSIS — Z7951 Long term (current) use of inhaled steroids: Secondary | ICD-10-CM | POA: Diagnosis not present

## 2014-11-15 DIAGNOSIS — E039 Hypothyroidism, unspecified: Secondary | ICD-10-CM | POA: Diagnosis not present

## 2014-11-15 HISTORY — PX: CARDIOVERSION: SHX1299

## 2014-11-15 SURGERY — CARDIOVERSION
Anesthesia: Monitor Anesthesia Care

## 2014-11-15 MED ORDER — PROPOFOL 10 MG/ML IV BOLUS
INTRAVENOUS | Status: DC | PRN
  Administered 2014-11-15 (×2): 30 mg via INTRAVENOUS
  Administered 2014-11-15: 10 mg via INTRAVENOUS
  Administered 2014-11-15: 30 mg via INTRAVENOUS

## 2014-11-15 MED ORDER — DABIGATRAN ETEXILATE MESYLATE 150 MG PO CAPS
150.0000 mg | ORAL_CAPSULE | Freq: Once | ORAL | Status: AC
  Administered 2014-11-15: 150 mg via ORAL
  Filled 2014-11-15: qty 1

## 2014-11-15 MED ORDER — SODIUM CHLORIDE 0.9 % IV SOLN
INTRAVENOUS | Status: DC
  Administered 2014-11-15: 13:00:00 via INTRAVENOUS

## 2014-11-15 NOTE — Anesthesia Preprocedure Evaluation (Addendum)
Anesthesia Evaluation  Patient identified by MRN, date of birth, ID band Patient awake    Reviewed: Allergy & Precautions  Airway Mallampati: II  TM Distance: >3 FB Neck ROM: Full    Dental   Pulmonary neg pulmonary ROS,  breath sounds clear to auscultation        Cardiovascular hypertension, +CHF Rhythm:Irregular Rate:Normal     Neuro/Psych    GI/Hepatic negative GI ROS, Neg liver ROS,   Endo/Other  diabetesHypothyroidism   Renal/GU      Musculoskeletal   Abdominal   Peds  Hematology   Anesthesia Other Findings   Reproductive/Obstetrics                            Anesthesia Physical Anesthesia Plan  ASA: III  Anesthesia Plan: MAC   Post-op Pain Management:    Induction: Intravenous  Airway Management Planned: Mask  Additional Equipment:   Intra-op Plan:   Post-operative Plan:   Informed Consent: I have reviewed the patients History and Physical, chart, labs and discussed the procedure including the risks, benefits and alternatives for the proposed anesthesia with the patient or authorized representative who has indicated his/her understanding and acceptance.   Dental advisory given  Plan Discussed with: CRNA, Anesthesiologist and Surgeon  Anesthesia Plan Comments:         Anesthesia Quick Evaluation

## 2014-11-15 NOTE — Discharge Instructions (Signed)

## 2014-11-15 NOTE — Transfer of Care (Signed)
Immediate Anesthesia Transfer of Care Note  Patient: Ann Fletcher  Procedure(s) Performed: Procedure(s): CARDIOVERSION (N/A)  Patient Location: PACU  Anesthesia Type:MAC  Level of Consciousness: awake, alert , oriented and sedated  Airway & Oxygen Therapy: Patient Spontanous Breathing and Patient connected to nasal cannula oxygen  Post-op Assessment: Report given to RN, Post -op Vital signs reviewed and stable and Patient moving all extremities  Post vital signs: Reviewed and stable  Last Vitals:  Filed Vitals:   11/15/14 1425  BP:   Pulse:   Resp: 21    Complications: No apparent anesthesia complications

## 2014-11-15 NOTE — Anesthesia Postprocedure Evaluation (Signed)
  Anesthesia Post-op Note  Patient: Ann Fletcher  Procedure(s) Performed: Procedure(s): CARDIOVERSION (N/A)  Patient Location: Endoscopy Unit  Anesthesia Type:MAC  Level of Consciousness: awake  Airway and Oxygen Therapy: Patient Spontanous Breathing  Post-op Pain: mild  Post-op Assessment: Post-op Vital signs reviewed              Post-op Vital Signs: Reviewed  Last Vitals:  Filed Vitals:   11/15/14 1450  BP: 136/95  Pulse: 78  Resp: 21    Complications: No apparent anesthesia complications

## 2014-11-15 NOTE — Progress Notes (Signed)
Dr. Aundra Dubin made aware of 12 lead EKG reading of accelerated junctional with long QTc. Dr. Aundra Dubin states he feels it is SR with 1 degree HB and will look at it but would like to continue with plan for discharge. Will continue to assess

## 2014-11-15 NOTE — Progress Notes (Signed)
EKG CRITICAL VALUE     12 lead EKG performed.  Critical value noted.Wandra Arthurs , RN notified.   Amilliana Hayworth L, CCT 11/15/2014 2:45 PM

## 2014-11-15 NOTE — H&P (View-Only) (Signed)
Patient ID: Ann Fletcher, female   DOB: December 08, 1932, 81 y.o.   MRN: [id number]      Primary Care Physician: [name], MD Referring Physician: Melina Copa, PA-C   Ann Fletcher is a 81 y.o. female with a h/o HTN, DM, HLD, lower extremity edema, hypothyroidism and recently diagnosed PAF/acute diastolic CHF who presents for evaluation in the afib clinic. She has h/o right carotid bruit with prior carotid US showing mild plaque only bilaterally. She had nuclear stress test in December 2015 which was negative with EF 77%. She was admitted to [facility] 7/7-7/15 with AF RVR. She had recently been diagnosed by PCP the week prior to admission after presenting with SOB. She was placed on Toprol and Pradaxa. Unfortunately, despite good outpatient effort, patient's heart rate continued to be elevated. She was seen by cardiology service and was admitted for planned TEE/DCCV. TSH and troponins were normal. Echocardiogram showed EF 50-55%, mild MR, severe TR, moderately dilated left atrium. On exam, she appeared fluid overloaded and was aggressively diuresed with IV diuresis. Her diltiazem was changed to IV form to allow for easier titration which was eventually transition to 240mg  daily. She was continued on 100mg  daily Toprol XL. She underwent TEE cardioversion on 10/08/2014 and received 3 synchronous shocks which were unsuccessful in converting her. Post procedure, she was started on IV amiodarone with the intention of repeat DC cardioversion after loading. After 7 days of loading with amiodarone 400 mg twice a day, she underwent repeat DC cardioversion on 10/15/2014 under propofol. She was successfully converted from atrial fibrillation to atrial tachycardia with PACs after 3 attempts. It appears the last note during her hospitalization indicates she was back in atrial fib but with lower rate prior to discharge. Amiodarone was decreased to 200mg  BID with plans to reduce further to 200mg  daily at 1 month  (~11/15/14). Discharge weight 171lbs (diuresed >7L during her admission). Of note CBC showed Hgb 11.0, microcytic with MCV 77.5. No recent LFTs.  When last seen by [name], PA, 7/29, she was feeling well. She is still in afib, HR around 100. She denies any CP, SOB, palpitations, dizziness or syncope. Her LEE has been minimal.. Baseline weight is stable.  She denies LEE, orthopnea, PND. She is following a low-salt diet. Denies any bleeding. EKg today with afib with v rate at 97 bpm, HTN, DM, HLD, lower extremity edema, hypothyroidism and recently diagnosed PAF/acute diastolic CHF who presents for post-hospital follow-up. She has h/o right carotid bruit with prior carotid US showing mild plaque only bilaterally. She had nuclear stress test in December 2015 which was negative with EF 77%. She was admitted to [facility] 7/7-7/15 with AF RVR. She had recently been diagnosed by PCP the week prior to admission after presenting with SOB. She was placed on Toprol and Pradaxa. Unfortunately, despite good outpatient effort, patient's heart rate continued to be elevated. She was seen by cardiology service and was admitted for planned TEE/DCCV. TSH and troponins were normal. Echocardiogram showed EF 50-55%, mild MR, severe TR, moderately dilated left atrium. On exam, she appeared fluid overloaded and was aggressively diuresed with IV diuresis. Her diltiazem was changed to IV form to allow for easier titration which was eventually transition to 240mg  daily. She was continued on 100mg  daily Toprol XL. She underwent TEE cardioversion on 10/08/2014 and received 3 synchronous shocks which were unsuccessful in converting her. Post procedure, she was started on IV amiodarone with the intention of repeat DC cardioversion after loading. After  7 days of loading with amiodarone 400 mg twice a day, she underwent repeat DC cardioversion on 10/15/2014 under propofol. She was successfully converted from atrial fibrillation to atrial tachycardia  with PACs after 3 attempts. It appears the last note during her hospitalization indicates she was back in atrial fib but with lower rate prior to discharge. Amiodarone was decreased to 200mg  BID with plans to reduce further to 200mg  daily at 1 month (~11/15/14). Discharge weight 171lbs (diuresed >7L during her admission). Of note CBC showed Hgb 11.0, microcytic with MCV 77.5. No recent LFTs.  When initially seen in afib clinic, 8/5, she was still  in afib, HR around 100. She denies any CP, SOB, palpitations, dizziness or syncope. Her LEE is minimal. Baseline weight is hovering around 168lbs which she says is where she has been since discharge. Bmet was repeated and showed increase in creat and bun. She was asked to reduce lasix to 1/2 tab of 80 mg lasix daily. She denies LEE, orthopnea, PND. She is following a low-salt diet. Denies any bleeding. I discussed with Dr. [name] and he suggested since she has been on amiodarone now x one month that it would be reasonable to try one more DCCV. If she does not stay in SR, then rate control with low dose amiodarone may be needed for rate control.  Today, she denies symptoms of palpitations, chest pain, shortness of breath, orthopnea, PND, lower extremity edema, dizziness, presyncope, syncope, or neurologic sequela. The patient is tolerating medications without difficulties and is otherwise without complaint today.   Past Medical History  Diagnosis Date  . Osteoporosis   . Hypothyroidism   . Hyperlipidemia   . Arthritis   . Hypertension   . Lower extremity edema   . Persistent atrial fibrillation     a. diagnosed in 10/2014 by PCP, underwent failed TEE DCCV on 10/08/14, loaded with amio, successfully DCCV on 10/15/14.  . Diabetes mellitus without complication     type 2  . Anxiety   . H/O bladder infections   . Chronic diastolic CHF (congestive heart failure)     a. Acute exacerbation occurred in setting of AF.   Past Surgical History  Procedure Laterality  Date  . Back surgery  2001, 2003, 2006    x 3  . Replacement total knee Right 07/2008  . Tubal ligation    . Tee without cardioversion N/A 10/08/2014    Procedure: TRANSESOPHAGEAL ECHOCARDIOGRAM (TEE);  Surgeon: [name], MD;  Location: [place];  Service: Cardiovascular;  Laterality: N/A;  . Cardioversion N/A 10/08/2014    Procedure: CARDIOVERSION;  Surgeon: [name], MD;  Location: [place];  Service: Cardiovascular;  Laterality: N/A;  . Cardioversion N/A 10/15/2014    Procedure: CARDIOVERSION;  Surgeon: [name], MD;  Location: [facility] ENDOSCOPY;  Service: Cardiovascular;  Laterality: N/A;    Current Outpatient Prescriptions  Medication Sig Dispense Refill  . albuterol (PROVENTIL HFA;VENTOLIN HFA) 108 (90 BASE) MCG/ACT inhaler Inhale 2 puffs into the lungs 3 (three) times daily as needed for wheezing.    [name] amiodarone (PACERONE) 200 MG tablet 200mg  twice a day for 1 month, then decrease to 200mg  daily on 8/23 60 tablet 3  . atorvastatin (LIPITOR) 40 MG tablet Take 40 mg by mouth daily.    . dabigatran (PRADAXA) 150 MG CAPS capsule Take 150 mg by mouth 2 (two) times daily.    [name] diltiazem (CARDIZEM CD) 360 MG 24 hr capsule Take 1 capsule (360 mg total) by  mouth daily. 30 capsule 11  . fluticasone (FLONASE) 50 MCG/ACT nasal spray Place 1 spray into both nostrils as needed.    . furosemide (LASIX) 80 MG tablet Take 0.5 tablets (40 mg total) by mouth daily. 60 tablet 11  . glimepiride (AMARYL) 4 MG tablet Take 4 mg by mouth 2 (two) times daily with a meal.    . levothyroxine (SYNTHROID, LEVOTHROID) 100 MCG tablet Take 1 tablet by mouth daily.    [name] LORazepam (ATIVAN) 1 MG tablet Take 1 mg by mouth at bedtime.     . metoprolol succinate (TOPROL-XL) 100 MG 24 hr tablet Take 100 mg by mouth daily. Take with or immediately following a meal.    . potassium chloride SA (K-DUR,KLOR-CON) 20 MEQ tablet Take 1 tablet (20 mEq total) by mouth daily. 60 tablet 11  . vitamin E 400 UNIT  capsule Take 400 Units by mouth daily.     No current facility-administered medications for this encounter.    Allergies  Allergen Reactions  . Nsaids Other (See Comments)    Stomach cramps  . Sulfa Antibiotics Nausea And Vomiting and Other (See Comments)    GI upset  . Betadine [Povidone Iodine] Itching    Social History   Social History  . Marital Status: Married    Spouse Name: N/A  . Number of Children: N/A  . Years of Education: N/A   Occupational History  . Not on file.   Social History Main Topics  . Smoking status: Never Smoker   . Smokeless tobacco: Never Used  . Alcohol Use: No  . Drug Use: No  . Sexual Activity: Yes    Birth Control/ Protection: Surgical   Other Topics Concern  . Not on file   Social History Narrative    Family History  Problem Relation Age of Onset  . Pneumonia Mother   . Heart attack Father   . Diabetes Sister     ROS- All systems are reviewed and negative except as per the HPI above  Physical Exam: Filed Vitals:   11/11/14 1335  BP: 120/60  Pulse: 103  Height: 5\' 3"  (1.6 m)  Weight: 170 lb (77.111 kg)    GEN- The patient is well appearing, alert and oriented x 3 today.   Head- normocephalic, atraumatic Eyes-  Sclera clear, conjunctiva pink Ears- hearing intact Oropharynx- clear Neck- supple, no JVP Lymph- no cervical lymphadenopathy Lungs- Clear to ausculation bilaterally, normal work of breathing Heart- Regular rate and rhythm, no murmurs, rubs or gallops, PMI not laterally displaced GI- soft, NT, ND, + BS Extremities- no clubbing, cyanosis, or edema MS- no significant deformity or atrophy Skin- no rash or lesion Psych- euthymic mood, full affect Neuro- strength and sensation are intact  EKG- afib at 103 bpm,QR 96 ms, QTc 387 ms.  Assessment and Plan:  1. Persistent afib Failed cardioversion x 2 Will attempt one further DCCV since she has been on amiodarone x one month Continue amiodarone at 200 mg bid,  will reduce to 200 mg qd when seen f/u cardioversion. Toprol 100 mg daily/diltiazem 360 mg daily for additional rate control Reasonably rate controlled today If  DCCV is unsuccessful may leave on low dose amiodarone to rate control but  Dr.  [name] does not think we need to aggressively seek SR, if clinically she is stable and minimally symptomatic. Continue Pradaxa without any reported missed doses. Pre procedure labs today  2. Chronic diastolic CHF Stable  Bmet pending  3.HTN Controlled  Ann Fletcher, [facility] [address] [place],  [address] [phone]

## 2014-11-15 NOTE — Procedures (Signed)
Electrical Cardioversion Procedure Note Ann Fletcher [id number] Oct 16, 1932  Procedure: Electrical Cardioversion Indications:  Atrial Fibrillation  Procedure Details Consent: Risks of procedure as well as the alternatives and risks of each were explained to the (patient/caregiver).  Consent for procedure obtained. Time Out: Verified patient identification, verified procedure, site/side was marked, verified correct patient position, special equipment/implants available, medications/allergies/relevent history reviewed, required imaging and test results available.  Performed  Patient placed on cardiac monitor, pulse oximetry, supplemental oxygen as necessary.  Sedation given: Propofol per anesthesiology Pacer pads placed anterior and posterior chest.  Cardioverted 1 time(s).  Cardioverted at [place].  Evaluation Findings: Post procedure EKG shows: NSR Complications: None Patient did tolerate procedure well.   Ann Fletcher 11/15/2014, 2:22 PM

## 2014-11-15 NOTE — Interval H&P Note (Signed)
History and Physical Interval Note:  11/15/2014 2:10 PM  DANAE TUCCILLO  has presented today for surgery, with the diagnosis of AFIB  The various methods of treatment have been discussed with the patient and family. After consideration of risks, benefits and other options for treatment, the patient has consented to  Procedure(s): CARDIOVERSION (N/A) as a surgical intervention .  The patient's history has been reviewed, patient examined, no change in status, stable for surgery.  I have reviewed the patient's chart and labs.  Questions were answered to the patient's satisfaction.     Earnestine Shipp [employer]

## 2014-11-16 ENCOUNTER — Encounter (HOSPITAL_COMMUNITY): Payer: Self-pay | Admitting: Cardiology

## 2014-11-18 ENCOUNTER — Other Ambulatory Visit: Payer: Self-pay

## 2014-11-18 NOTE — Patient Outreach (Signed)
[facility]) Care Management  11/18/2014  Ann Fletcher [birth year] [id number]   Call made to patient to schedule home visit and to assess for community care coordination needs. Patient identified herself by providing HIPPA identifier information of date of birth and address.  Patent states she was lying down but agreed to brief conversation. Patient states she and husband were doing fine, she was feeling a little tired today.   She stated her blood sugar was 74 this morning but came up to low 100's and she was feeling better but tired.  Patient agreed to home visit on Tuesday, November 23, 2014  Plan: Home visit on August 23 to assess further  for community care coordination needs.

## 2014-11-23 ENCOUNTER — Ambulatory Visit (HOSPITAL_COMMUNITY)
Admission: RE | Admit: 2014-11-23 | Discharge: 2014-11-23 | Disposition: A | Discharge status: Home / Self Care | Payer: Medicare Other | Source: Ambulatory Visit | Attending: Nurse Practitioner | Admitting: Nurse Practitioner

## 2014-11-23 ENCOUNTER — Other Ambulatory Visit: Payer: Self-pay

## 2014-11-23 ENCOUNTER — Other Ambulatory Visit (HOSPITAL_COMMUNITY): Payer: Self-pay

## 2014-11-23 VITALS — BP 110/58 | HR 91 | Ht 63.0 in | Wt 166.0 lb

## 2014-11-23 DIAGNOSIS — K30 Functional dyspepsia: Secondary | ICD-10-CM | POA: Diagnosis not present

## 2014-11-23 DIAGNOSIS — I5032 Chronic diastolic (congestive) heart failure: Secondary | ICD-10-CM | POA: Diagnosis not present

## 2014-11-23 DIAGNOSIS — I10 Essential (primary) hypertension: Secondary | ICD-10-CM | POA: Insufficient documentation

## 2014-11-23 DIAGNOSIS — I4819 Other persistent atrial fibrillation: Secondary | ICD-10-CM

## 2014-11-23 DIAGNOSIS — I481 Persistent atrial fibrillation: Secondary | ICD-10-CM

## 2014-11-23 LAB — CBC
HCT: 39.7 % (ref 36.0–46.0)
Hemoglobin: 12.8 g/dL (ref 12.0–15.0)
MCH: 24.9 pg — ABNORMAL LOW (ref 26.0–34.0)
MCHC: 32.2 g/dL (ref 30.0–36.0)
MCV: 77.1 fL — ABNORMAL LOW (ref 78.0–100.0)
Platelets: 254 10*3/uL (ref 150–400)
RBC: 5.15 MIL/uL — ABNORMAL HIGH (ref 3.87–5.11)
RDW: 19.6 % — ABNORMAL HIGH (ref 11.5–15.5)
WBC: 10.6 10*3/uL — ABNORMAL HIGH (ref 4.0–10.5)

## 2014-11-23 LAB — COMPREHENSIVE METABOLIC PANEL WITH GFR
ALT: 35 U/L (ref 14–54)
AST: 37 U/L (ref 15–41)
Albumin: 4.1 g/dL (ref 3.5–5.0)
Alkaline Phosphatase: 81 U/L (ref 38–126)
Anion gap: 13 (ref 5–15)
BUN: 30 mg/dL — ABNORMAL HIGH (ref 6–20)
CO2: 27 mmol/L (ref 22–32)
Calcium: 10.3 mg/dL (ref 8.9–10.3)
Chloride: 100 mmol/L — ABNORMAL LOW (ref 101–111)
Creatinine, Ser: 1.28 mg/dL — ABNORMAL HIGH (ref 0.44–1.00)
GFR calc Af Amer: 45 mL/min — ABNORMAL LOW
GFR calc non Af Amer: 38 mL/min — ABNORMAL LOW
Glucose, Bld: 120 mg/dL — ABNORMAL HIGH (ref 65–99)
Potassium: 4.7 mmol/L (ref 3.5–5.1)
Sodium: 140 mmol/L (ref 135–145)
Total Bilirubin: 0.6 mg/dL (ref 0.3–1.2)
Total Protein: 8.2 g/dL — ABNORMAL HIGH (ref 6.5–8.1)

## 2014-11-23 MED ORDER — APIXABAN 5 MG PO TABS: 5.0000 mg | ORAL_TABLET | Freq: Two times a day (BID) | ORAL | Status: DC

## 2014-11-23 NOTE — Progress Notes (Signed)
Patient ID: Ann Fletcher, female   DOB: [birth year], 81 y.o.   MRN: [id number] Patient ID: Ann Fletcher, female   DOB: 1932/12/18, 81 y.o.   MRN: [id number]      Primary Care Physician: [name], MD Referring Physician: Melina Copa, PA-C   Ann Fletcher is a 81 y.o. female with a h/o HTN, DM, HLD, lower extremity edema, hypothyroidism and recently diagnosed PAF/acute diastolic CHF who presents for evaluation in the afib clinic. She has h/o right carotid bruit with prior carotid US showing mild plaque only bilaterally. She had nuclear stress test in December 2015 which was negative with EF 77%. She was admitted to [facility] 7/7-7/15 with AF RVR. She had recently been diagnosed by PCP the week prior to admission after presenting with SOB. She was placed on Toprol and Pradaxa. Unfortunately, despite good outpatient effort, patient's heart rate continued to be elevated. She was seen by cardiology service and was admitted for planned TEE/DCCV. TSH and troponins were normal. Echocardiogram showed EF 50-55%, mild MR, severe TR, moderately dilated left atrium. On exam, she appeared fluid overloaded and was aggressively diuresed with IV diuresis. Her diltiazem was changed to IV form to allow for easier titration which was eventually transition to 240mg  daily. She was continued on 100mg  daily Toprol XL. She underwent TEE cardioversion on 10/08/2014 and received 3 synchronous shocks which were unsuccessful in converting her. Post procedure, she was started on IV amiodarone with the intention of repeat DC cardioversion after loading. After 7 days of loading with amiodarone 400 mg twice a day, she underwent repeat DC cardioversion on 10/15/2014 under propofol. She was successfully converted from atrial fibrillation to atrial tachycardia with PACs after 3 attempts. It appears the last note during her hospitalization indicates she was back in atrial fib but with lower rate prior to discharge. Amiodarone was  decreased to 200mg  BID with plans to reduce further to 200mg  daily at 1 month (~11/15/14). Discharge weight 171lbs (diuresed >7L during her admission). Of note CBC showed Hgb 11.0, microcytic with MCV 77.5. No recent LFTs.  When last seen by [name], PA, 7/29, she was feeling well. She is still in afib, HR around 100. She denies any CP, SOB, palpitations, dizziness or syncope. Her LEE has been minimal.. Baseline weight is stable.  She denies LEE, orthopnea, PND. She is following a low-salt diet.   When initially seen in afib clinic, 8/5, she was still  in afib, HR around 100. She denies any CP, SOB, palpitations, dizziness or syncope. Her LEE is minimal. Baseline weight is hovering around 168lbs which she says is where she has been since discharge. Bmet was repeated and showed increase in creat and bun. She was asked to reduce lasix to 1/2 tab of 80 mg lasix daily. She denies LEE, orthopnea, PND. She is following a low-salt diet. Denies any bleeding. I discussed with Dr. [name] and he suggested since she has been on amiodarone now x one month that it would be reasonable to try one more DCCV. If she does not stay in SR, then rate control with low dose amiodarone may be needed for rate control.  She was scheduled for DCCV on 8/15 and did convert to SR. However, unfortunately,  did have ERAF. She states that she had n/v on Thursday,denied blood in the vomitus and denies blood in stool. Now no further vomiting but states her stomach stays upset after taking pradaxa and her appetitive has not been as good. Will try switching  her to eliquis and see if she tolerates better. Will also reduce amiodarone today to 200 mg . Amiodarone could be contributing to GI complaints as well. Dr. [name] suggested that she may need low dose amiodarone to help with rate control. She does not have complaints of palpitations, weight is stable, down 2 lbs from last visit.  Today, she denies symptoms of palpitations, chest pain,  shortness of breath, orthopnea, PND, lower extremity edema, dizziness, presyncope, syncope, or neurologic sequela. Positive for recent GI complaints, vomiting last week resolved with ongoing stomach upset and nausea.   Past Medical History  Diagnosis Date  . Osteoporosis   . Hypothyroidism   . Hyperlipidemia   . Arthritis   . Hypertension   . Lower extremity edema   . Persistent atrial fibrillation     a. diagnosed in 10/2014 by PCP, underwent failed TEE DCCV on 10/08/14, loaded with amio, successfully DCCV on 10/15/14.  . Diabetes mellitus without complication     type 2  . Anxiety   . H/O bladder infections   . Chronic diastolic CHF (congestive heart failure)     a. Acute exacerbation occurred in setting of AF.   Past Surgical History  Procedure Laterality Date  . Back surgery  2001, 2003, 2006    x 3  . Replacement total knee Right 07/2008  . Tubal ligation    . Tee without cardioversion N/A 10/08/2014    Procedure: TRANSESOPHAGEAL ECHOCARDIOGRAM (TEE);  Surgeon: [name], MD;  Location: [place];  Service: Cardiovascular;  Laterality: N/A;  . Cardioversion N/A 10/08/2014    Procedure: CARDIOVERSION;  Surgeon: [name], MD;  Location: [place];  Service: Cardiovascular;  Laterality: N/A;  . Cardioversion N/A 10/15/2014    Procedure: CARDIOVERSION;  Surgeon: [name], MD;  Location: [place];  Service: Cardiovascular;  Laterality: N/A;  . Cardioversion N/A 11/15/2014    Procedure: CARDIOVERSION;  Surgeon: [name], MD;  Location: [facility] ENDOSCOPY;  Service: Cardiovascular;  Laterality: N/A;    Current Outpatient Prescriptions  Medication Sig Dispense Refill  . albuterol (PROVENTIL HFA;VENTOLIN HFA) 108 (90 BASE) MCG/ACT inhaler Inhale 2 puffs into the lungs 3 (three) times daily as needed for wheezing.    [name] atorvastatin (LIPITOR) 40 MG tablet Take 40 mg by mouth daily.    [name] diltiazem (CARDIZEM CD) 360 MG 24 hr capsule Take 1 capsule (360 mg total) by  mouth daily. 30 capsule 11  . fluticasone (FLONASE) 50 MCG/ACT nasal spray Place 1 spray into both nostrils as needed.    . furosemide (LASIX) 80 MG tablet Take 0.5 tablets (40 mg total) by mouth daily. 60 tablet 11  . glimepiride (AMARYL) 4 MG tablet Take 4 mg by mouth 2 (two) times daily with a meal.    . levothyroxine (SYNTHROID, LEVOTHROID) 100 MCG tablet Take 1 tablet by mouth daily.    [name] LORazepam (ATIVAN) 1 MG tablet Take 1 mg by mouth at bedtime.     . metoprolol succinate (TOPROL-XL) 100 MG 24 hr tablet Take 100 mg by mouth daily. Take with or immediately following a meal.    . potassium chloride SA (K-DUR,KLOR-CON) 20 MEQ tablet Take 1 tablet (20 mEq total) by mouth daily. 60 tablet 11  . vitamin E 400 UNIT capsule Take 400 Units by mouth daily.    [name] amiodarone (PACERONE) 200 MG tablet 200mg  twice a day for 1 month, then decrease to 200mg  daily on 8/23 (Patient not taking: Reported on 11/23/2014) 60  tablet 3  . apixaban (ELIQUIS) 5 MG TABS tablet Take 1 tablet (5 mg total) by mouth 2 (two) times daily. 60 tablet 0   No current facility-administered medications for this encounter.    Allergies  Allergen Reactions  . Nsaids Other (See Comments)    Stomach cramps  . Sulfa Antibiotics Nausea And Vomiting and Other (See Comments)    GI upset  . Betadine [Povidone Iodine] Itching    Social History   Social History  . Marital Status: Married    Spouse Name: N/A  . Number of Children: N/A  . Years of Education: N/A   Occupational History  . Not on file.   Social History Main Topics  . Smoking status: Never Smoker   . Smokeless tobacco: Never Used  . Alcohol Use: No  . Drug Use: No  . Sexual Activity: Yes    Birth Control/ Protection: Surgical   Other Topics Concern  . Not on file   Social History Narrative    Family History  Problem Relation Age of Onset  . Pneumonia Mother   . Heart attack Father   . Diabetes Sister     ROS- All systems are reviewed and  negative except as per the HPI above  Physical Exam: Filed Vitals:   11/23/14 1326  BP: 110/58  Pulse: 91  Height: 5\' 3"  (1.6 m)  Weight: 166 lb (75.297 kg)    GEN- The patient is not in any distress, alert and oriented x 3 today.   Head- normocephalic, atraumatic Eyes-  Sclera clear, conjunctiva pink Ears- hearing intact Oropharynx- clear Neck- supple, no JVP Lymph- no cervical lymphadenopathy Lungs- Clear to ausculation bilaterally, normal work of breathing Heart- Irregular rate and rhythm, no murmurs, rubs or gallops, PMI not laterally displaced GI- soft, NT, ND, + BS Extremities- no clubbing, cyanosis, or edema MS- no significant deformity or atrophy Skin- no rash or lesion Psych- euthymic mood, full affect Neuro- strength and sensation are intact  EKG- afib at 91 bpm, QRS int 94 ms, QTc 499 ms.  Assessment and Plan:  1. Persistent afib Failed cardioversion x 3 Decrease  Amiodarone to 200 mg a day and if GI complaints continue, rate control decent,  consider decreasing dose to 100 mg daily on f/u with [name] 8/30. Toprol 100 mg daily/diltiazem 360 mg daily for additional rate control Reasonably rate controlled today   Dr.  [name] does not think we need to aggressively seek SR, if clinically she is stable and minimally symptomatic.  2. GI distress/ indigestion,N/V, anorexia Stop pradaxa and start eliquis 5 mg bid. She knows to start tonight at which time she will no longer take pradaxa. CBC,cmet today  3. Chronic diastolic HF Weight stable  4.HTN Controlled  F/u with [name] 8/30 as scheduled, Afib clinic as needed   Butch Penny C. Kenia Teagarden, [facility] [address] [place], [place] [address] [phone]

## 2014-11-23 NOTE — Patient Instructions (Signed)
Your physician has recommended you make the following change in your medication:  1) Stop Pradaxa 2) Start ELIQUIS 5 mg 2 times a day. 3) Decrease Amiodarone to 1 tablet a day. 4) Follow up with [name] as scheduled 11/30/14 @ 2:25 at the [facility].

## 2014-11-24 NOTE — Patient Outreach (Addendum)
[facility]) Care Management  [place]  November 23, 2014   Ann Fletcher [birth year] [id number]  Subjective:  I think that Pradaxa is causing my stomach to hurt. I did not take one today because I did not want to get nauseated.     Objective:  Patient was alert oriented, pleasant, readily participated in community care coordination assessment.  Current Medications:  Current Outpatient Prescriptions  Medication Sig Dispense Refill  . albuterol (PROVENTIL HFA;VENTOLIN HFA) 108 (90 BASE) MCG/ACT inhaler Inhale 2 puffs into the lungs 3 (three) times daily as needed for wheezing.    [name] amiodarone (PACERONE) 200 MG tablet 200mg  twice a day for 1 month, then decrease to 200mg  daily on 8/23 (Patient not taking: Reported on 11/23/2014) 60 tablet 3  . atorvastatin (LIPITOR) 40 MG tablet Take 40 mg by mouth daily.    [name] diltiazem (CARDIZEM CD) 360 MG 24 hr capsule Take 1 capsule (360 mg total) by mouth daily. 30 capsule 11  . fluticasone (FLONASE) 50 MCG/ACT nasal spray Place 1 spray into both nostrils as needed.    . furosemide (LASIX) 80 MG tablet Take 0.5 tablets (40 mg total) by mouth daily. 60 tablet 11  . glimepiride (AMARYL) 4 MG tablet Take 4 mg by mouth 2 (two) times daily with a meal.    . levothyroxine (SYNTHROID, LEVOTHROID) 100 MCG tablet Take 1 tablet by mouth daily.    [name] LORazepam (ATIVAN) 1 MG tablet Take 1 mg by mouth at bedtime.     . metoprolol succinate (TOPROL-XL) 100 MG 24 hr tablet Take 100 mg by mouth daily. Take with or immediately following a meal.    . potassium chloride SA (K-DUR,KLOR-CON) 20 MEQ tablet Take 1 tablet (20 mEq total) by mouth daily. 60 tablet 11  . vitamin E 400 UNIT capsule Take 400 Units by mouth daily.    [name] apixaban (ELIQUIS) 5 MG TABS tablet Take 1 tablet (5 mg total) by mouth 2 (two) times daily. 60 tablet 0   No current facility-administered medications for this visit.    Functional Status:  In your present state of  health, do you have any difficulty performing the following activities: 11/23/2014 10/20/2014  Hearing? N -  Vision? Y -  Difficulty concentrating or making decisions? N -  Walking or climbing stairs? Y -  Dressing or bathing? Y -  Doing errands, shopping? Y -  Conservation officer, nature and eating ? Y N  Using the Toilet? N N  In the past six months, have you accidently leaked urine? N N  Do you have problems with loss of bowel control? N N  Managing your Medications? N N  Managing your Finances? N N  Housekeeping or managing your Housekeeping? N Y    Fall/Depression Screening: PHQ 2/9 Scores 11/23/2014 10/20/2014  PHQ - 2 Score 0 0    Assessment:  Patient has been compliant with medications, has all prescriptions filled. Patient has appointment with cardiologist today Patient advised to inform physician when she does not take her medications, especially her medications used to prevent clots.   Husband and In Home Aide present during home visit. Patient has been established with [employer] for transportation to medical appointments. Patient's pastor takes patient and husband to the store every 2 weeks to shop, per patient's report.    Plan:  Follow up with patient's primary care physician to advise him of patient stopping medicaitons Home visit planned for [facility]

## 2014-11-25 ENCOUNTER — Telehealth (HOSPITAL_COMMUNITY): Payer: Self-pay | Admitting: Nurse Practitioner

## 2014-11-25 NOTE — Telephone Encounter (Signed)
Called pt and she reports that since she switched to eliquis, lowered amiodarone dose and holding lasix x 2 days that she is  feeling stronger and can eat and drink now without feeling sick. She f/u's with [name] 8/30.

## 2014-11-30 ENCOUNTER — Ambulatory Visit (INDEPENDENT_AMBULATORY_CARE_PROVIDER_SITE_OTHER): Payer: Medicare Other | Admitting: Cardiovascular Disease

## 2014-11-30 ENCOUNTER — Encounter: Payer: Self-pay | Admitting: Cardiovascular Disease

## 2014-11-30 VITALS — BP 122/42 | HR 55 | Ht 62.0 in | Wt 165.7 lb

## 2014-11-30 DIAGNOSIS — I10 Essential (primary) hypertension: Secondary | ICD-10-CM

## 2014-11-30 DIAGNOSIS — I4891 Unspecified atrial fibrillation: Secondary | ICD-10-CM

## 2014-11-30 MED ORDER — DILTIAZEM HCL ER COATED BEADS 180 MG PO CP24: 180.0000 mg | ORAL_CAPSULE | Freq: Every day | ORAL | Status: DC

## 2014-11-30 MED ORDER — AMIODARONE HCL 100 MG PO TABS: ORAL_TABLET | ORAL | Status: DC

## 2014-11-30 NOTE — Progress Notes (Signed)
11/30/2014 Ann Fletcher   [birth year]  [id number]  Primary Physician Ann Seashore, MD Primary Cardiologist: Ann Harp MD Ann Fletcher   HPI:  Ann Fletcher is a 81 year old moderately overweight married Caucasian female whose husband is also a patient of mine. She is a mother of 3 and grandmother and 5 grandchildren. She was referred by Dr. Ashby Fletcher and for preoperative clearance before elective back surgery. Her cardiac risk factor profile is notable for treated hypertension, diabetes and hyperlipidemia. She does have bilateral looks to me edema. There is no family history of heart disease. She's never had a heart attack or stroke. She denies chest pain or shortness of breath. She apparently has back issues which may require surgery and was referred here for cardiovascular surgical clearance. She was hospitalized at [facility] from July 7 through the 15th with A. Fib with RVR and m edema. She was diuresed, loaded with amiodarone and ultimately cardioverted by Dr. Marlou Fletcher successfully after 3 shocks which ultimately did not hold. She saw edema and time PA-C in the office 10/29/14 in follow-up. Her heart rate at that time was 100. Today she feels somewhat weak and nauseated. Her EKG shows junctional bradycardia 40. She is on Eliquis  . I'm going to decrease before amiodarone and her diltiazem. She will follow-up with Ann Fletcher in the [name]   Current Outpatient Prescriptions  Medication Sig Dispense Refill  . albuterol (PROVENTIL HFA;VENTOLIN HFA) 108 (90 BASE) MCG/ACT inhaler Inhale 2 puffs into the lungs 3 (three) times daily as needed for wheezing.    [name] amiodarone (PACERONE) 100 MG tablet Take 100mg  daily 15 tablet 6  . apixaban (ELIQUIS) 5 MG TABS tablet Take 1 tablet (5 mg total) by mouth 2 (two) times daily. 60 tablet 0  . atorvastatin (LIPITOR) 40 MG tablet Take 40 mg by mouth daily.    . fluticasone (FLONASE) 50 MCG/ACT nasal spray Place 1 spray  into both nostrils as needed.    . furosemide (LASIX) 80 MG tablet Take 0.5 tablets (40 mg total) by mouth daily. 60 tablet 11  . glimepiride (AMARYL) 4 MG tablet Take 4 mg by mouth 2 (two) times daily with a meal.    . levothyroxine (SYNTHROID, LEVOTHROID) 100 MCG tablet Take 1 tablet by mouth daily.    [name] LORazepam (ATIVAN) 1 MG tablet Take 1 mg by mouth at bedtime.     . metoprolol succinate (TOPROL-XL) 100 MG 24 hr tablet Take 100 mg by mouth daily. Take with or immediately following a meal.    . potassium chloride SA (K-DUR,KLOR-CON) 20 MEQ tablet Take 1 tablet (20 mEq total) by mouth daily. 60 tablet 11  . vitamin E 400 UNIT capsule Take 400 Units by mouth daily.    [name] diltiazem (CARDIZEM CD) 180 MG 24 hr capsule Take 1 capsule (180 mg total) by mouth daily. 30 capsule 11   No current facility-administered medications for this visit.    Allergies  Allergen Reactions  . Nsaids Other (See Comments)    Stomach cramps  . Sulfa Antibiotics Nausea And Vomiting and Other (See Comments)    GI upset  . Betadine [Povidone Iodine] Itching    Social History   Social History  . Marital Status: Married    Spouse Name: N/A  . Number of Children: N/A  . Years of Education: N/A   Occupational History  . Not on file.   Social History Main Topics  . Smoking status: Never Smoker   .  Smokeless tobacco: Never Used  . Alcohol Use: No  . Drug Use: No  . Sexual Activity: Yes    Birth Control/ Protection: Surgical   Other Topics Concern  . Not on file   Social History Narrative     Review of Systems: General: negative for chills, fever, night sweats or weight changes.  Cardiovascular: negative for chest pain, dyspnea on exertion, edema, orthopnea, palpitations, paroxysmal nocturnal dyspnea or shortness of breath Dermatological: negative for rash Respiratory: negative for cough or wheezing Urologic: negative for hematuria Abdominal: negative for nausea, vomiting, diarrhea, bright red  blood per rectum, melena, or hematemesis Neurologic: negative for visual changes, syncope, or dizziness All other systems reviewed and are otherwise negative except as noted above.    Blood pressure 122/42, pulse 55, height 5\' 2"  (1.575 m), weight 165 lb 11.2 oz (75.161 kg).  General appearance: alert and no distress Neck: no adenopathy, no carotid bruit, no JVD, supple, symmetrical, trachea midline and thyroid not enlarged, symmetric, no tenderness/mass/nodules Lungs: clear to auscultation bilaterally Heart: regular and bradycardic Extremities: extremities normal, atraumatic, no cyanosis or edema  EKG junctional bradycardia of 40 with nonspecific ST and T-wave changes. I personally reviewed this EKG  ASSESSMENT AND PLAN:   Hyperlipidemia History of hyperlipidemia or atorvastatin 40 followed by her PCP  Essential hypertension History of hypertension blood pressure measures at 122/42. She is on diltiazem and metoprolol. Continue current meds at current dosing  Atrial fibrillation with RVR Ann Fletcher recently admitted to [facility] on 77 through 7:15 with A. Fib with RVR and lower extremity edema. She was diuresed, rate controlled and loaded with amiodarone after which she underwent successful cardioversion by Dr. Marlou Fletcher after 3 shocks which ultimately was unsuccessful. She saw data done back in the office on 7/29 which time she remained in A. Fib on requests with a heart rate of 100 at that time. She feels somewhat weak and nauseated. Today her EKG shows a junctional rhythm at 40. I'm going to decrease her amiodarone to daily, half his diltiazem 360 mg to 180 mg. We will have her see Ann Fletcher  in the [name] within the next week or 2, a mid-level provider in 3 months and me back in 6 months      Ann Harp MD [place] 11/30/2014 3:44 PM

## 2014-11-30 NOTE — Assessment & Plan Note (Signed)
History of hypertension blood pressure measures at 122/42. She is on diltiazem and metoprolol. Continue current meds at current dosing

## 2014-11-30 NOTE — Patient Instructions (Signed)
Medication Instructions:   Decrease Amiodarone to 100mg  daily  Decrease Diltiazem to 180mg  daily  Labwork:  n/a  Testing/Procedures:  n/a  Follow-Up:  2 weeks with Roderic Palau in the Afib clinic 3 months with an extender and 6 months with Dr [name]  Any Other Special Instructions Will Be Listed Below (If Applicable).

## 2014-11-30 NOTE — Assessment & Plan Note (Signed)
History of hyperlipidemia or atorvastatin 40 followed by her PCP

## 2014-11-30 NOTE — Assessment & Plan Note (Signed)
Mr. Carchi recently admitted to [facility] on 77 through 7:15 with A. Fib with RVR and lower extremity edema. She was diuresed, rate controlled and loaded with amiodarone after which she underwent successful cardioversion by Dr. Marlou Porch after 3 shocks which ultimately was unsuccessful. She saw data done back in the office on 7/29 which time she remained in A. Fib on requests with a heart rate of 100 at that time. She feels somewhat weak and nauseated. Today her EKG shows a junctional rhythm at 40. I'm going to decrease her amiodarone to daily, half his diltiazem 360 mg to 180 mg. We will have her see Roderic Palau  in the [name] within the next week or 2, a mid-level provider in 3 months and me back in 6 months

## 2014-12-10 DIAGNOSIS — I10 Essential (primary) hypertension: Secondary | ICD-10-CM | POA: Diagnosis not present

## 2014-12-10 DIAGNOSIS — Z23 Encounter for immunization: Secondary | ICD-10-CM | POA: Diagnosis not present

## 2014-12-10 DIAGNOSIS — E1165 Type 2 diabetes mellitus with hyperglycemia: Secondary | ICD-10-CM | POA: Diagnosis not present

## 2014-12-10 DIAGNOSIS — R269 Unspecified abnormalities of gait and mobility: Secondary | ICD-10-CM | POA: Diagnosis not present

## 2014-12-10 DIAGNOSIS — Z780 Asymptomatic menopausal state: Secondary | ICD-10-CM | POA: Diagnosis not present

## 2014-12-10 DIAGNOSIS — E039 Hypothyroidism, unspecified: Secondary | ICD-10-CM | POA: Diagnosis not present

## 2014-12-10 DIAGNOSIS — R600 Localized edema: Secondary | ICD-10-CM | POA: Diagnosis not present

## 2014-12-10 DIAGNOSIS — E782 Mixed hyperlipidemia: Secondary | ICD-10-CM | POA: Diagnosis not present

## 2014-12-16 ENCOUNTER — Encounter (HOSPITAL_COMMUNITY): Payer: Self-pay | Admitting: Nurse Practitioner

## 2014-12-16 ENCOUNTER — Ambulatory Visit (HOSPITAL_COMMUNITY)
Admission: RE | Admit: 2014-12-16 | Discharge: 2014-12-16 | Disposition: A | Discharge status: Home / Self Care | Payer: Medicare Other | Source: Ambulatory Visit | Attending: Nurse Practitioner | Admitting: Nurse Practitioner

## 2014-12-16 VITALS — BP 134/52 | HR 58 | Ht 62.0 in | Wt 167.8 lb

## 2014-12-16 DIAGNOSIS — I5032 Chronic diastolic (congestive) heart failure: Secondary | ICD-10-CM | POA: Insufficient documentation

## 2014-12-16 DIAGNOSIS — R630 Anorexia: Secondary | ICD-10-CM | POA: Insufficient documentation

## 2014-12-16 DIAGNOSIS — K30 Functional dyspepsia: Secondary | ICD-10-CM | POA: Insufficient documentation

## 2014-12-16 DIAGNOSIS — R112 Nausea with vomiting, unspecified: Secondary | ICD-10-CM | POA: Diagnosis not present

## 2014-12-16 DIAGNOSIS — I10 Essential (primary) hypertension: Secondary | ICD-10-CM | POA: Diagnosis not present

## 2014-12-16 DIAGNOSIS — I481 Persistent atrial fibrillation: Secondary | ICD-10-CM | POA: Insufficient documentation

## 2014-12-16 DIAGNOSIS — I4819 Other persistent atrial fibrillation: Secondary | ICD-10-CM

## 2014-12-16 MED ORDER — METOPROLOL SUCCINATE ER 100 MG PO TB24: ORAL_TABLET | ORAL | Status: DC

## 2014-12-16 NOTE — Progress Notes (Signed)
Patient ID: Ann Fletcher, female   DOB: 1932/04/10, 81 y.o.   MRN: [id number]      Primary Care Physician: [name], MD Referring Physician: Melina Copa, PA-C Cardiologist: Dr. Eulah [name] is a 81 y.o. female with a h/o HTN, DM, HLD, lower extremity edema, hypothyroidism and recently diagnosed PAF/acute diastolic CHF who presents for f/u in the afib clinic. She has h/o right carotid bruit with prior carotid US showing mild plaque only bilaterally. She had nuclear stress test in December 2015 which was negative with EF 77%. She was admitted to [facility] 7/7-7/15 with AF RVR. She had recently been diagnosed by PCP the week prior to admission after presenting with SOB. She was placed on Toprol and Pradaxa. Unfortunately, despite good outpatient effort, patient's heart rate continued to be elevated. She was seen by cardiology service and was admitted for planned TEE/DCCV. TSH and troponins were normal. Echocardiogram showed EF 50-55%, mild MR, severe TR, moderately dilated left atrium. On exam, she appeared fluid overloaded and was aggressively diuresed with IV diuresis. Her diltiazem was changed to IV form to allow for easier titration which was eventually transition to 240mg  daily. She was continued on 100mg  daily Toprol XL. She underwent TEE cardioversion on 10/08/2014 and received 3 synchronous shocks which were unsuccessful in converting her. Post procedure, she was started on IV amiodarone with the intention of repeat DC cardioversion after loading. After 7 days of loading with amiodarone 400 mg twice a day, she underwent repeat DC cardioversion on 10/15/2014 under propofol. She was successfully converted from atrial fibrillation to atrial tachycardia with PACs after 3 attempts. It appears the last note during her hospitalization indicates she was back in atrial fib but with lower rate prior to discharge. Amiodarone was decreased to 200mg  BID with plans to reduce further to 200mg  daily  at 1 month (~11/15/14). Discharge weight 171lbs (diuresed >7L during her admission). Of note CBC showed Hgb 11.0, microcytic with MCV 77.5. No recent LFTs.  When last seen by [name], PA, 7/29, she was feeling well. She was still in afib, HR around 100. She denied any CP, SOB, palpitations, dizziness or syncope. Her LEE has been minimal. Baseline weight was stable.  She denied LEE, orthopnea, PND. She is following a low-salt diet.   When initially seen in afib clinic, 8/5, she was still  in afib, HR around 100. She denies any CP, SOB, palpitations, dizziness or syncope. Her LEE is minimal. Baseline weight is hovering around 168lbs which she says is where she has been since discharge. Bmet was repeated and showed increase in creat and bun. She was asked to reduce lasix to 1/2 tab of 80 mg lasix daily. She denies LEE, orthopnea, PND. [name] Denies any bleeding. I discussed with Dr. [name] and he suggested since she has been on amiodarone now x one month that it would be reasonable to try one more DCCV. If she does not stay in SR, then rate control with low dose amiodarone may be indicated.  She was scheduled for DCCV on 8/15 and did convert to SR. However, unfortunately,  did have ERAF. She states that she had n/v on Thursday,denied blood in the vomitus and denies blood in stool. Now no further vomiting but states her stomach stays upset after taking pradaxa and her appetitive has not been as good. Will try switching her to eliquis and see if she tolerates better. Will also reduce amiodarone today to 200 mg . Amiodarone could be  contributing to GI complaints as well. Dr. [name] suggested that she may need low dose amiodarone to help with rate control. She does not have complaints of palpitations, weight is stable, down 2 lbs from last visit.  She was seen by [name] recently and found to be in a junctional rhythm. Amiodarone dose was decreased to 100 mg daily and cardizem dose was cut in half. In the afib clinic  today, she is in sinus brady with first degree AV block. She feels well, no further stomach complaints. Weight is stable.  Today, she denies symptoms of palpitations, chest pain, shortness of breath, orthopnea, PND, mild lower extremity edema, dizziness, presyncope, syncope, or neurologic sequela. Positive for recent GI complaints, vomiting last week resolved with ongoing stomach upset and nausea.   Past Medical History  Diagnosis Date  . Osteoporosis   . Hypothyroidism   . Hyperlipidemia   . Arthritis   . Hypertension   . Lower extremity edema   . Persistent atrial fibrillation     a. diagnosed in 10/2014 by PCP, underwent failed TEE DCCV on 10/08/14, loaded with amio, successfully DCCV on 10/15/14.  . Diabetes mellitus without complication     type 2  . Anxiety   . H/O bladder infections   . Chronic diastolic CHF (congestive heart failure)     a. Acute exacerbation occurred in setting of AF.   Past Surgical History  Procedure Laterality Date  . Back surgery  2001, 2003, 2006    x 3  . Replacement total knee Right 07/2008  . Tubal ligation    . Tee without cardioversion N/A 10/08/2014    Procedure: TRANSESOPHAGEAL ECHOCARDIOGRAM (TEE);  Surgeon: [name], MD;  Location: [place];  Service: Cardiovascular;  Laterality: N/A;  . Cardioversion N/A 10/08/2014    Procedure: CARDIOVERSION;  Surgeon: [name], MD;  Location: [place];  Service: Cardiovascular;  Laterality: N/A;  . Cardioversion N/A 10/15/2014    Procedure: CARDIOVERSION;  Surgeon: [name], MD;  Location: [place];  Service: Cardiovascular;  Laterality: N/A;  . Cardioversion N/A 11/15/2014    Procedure: CARDIOVERSION;  Surgeon: [name], MD;  Location: [facility] ENDOSCOPY;  Service: Cardiovascular;  Laterality: N/A;    Current Outpatient Prescriptions  Medication Sig Dispense Refill  . albuterol (PROVENTIL HFA;VENTOLIN HFA) 108 (90 BASE) MCG/ACT inhaler Inhale 2 puffs into the lungs 3 (three) times  daily as needed for wheezing.    [name] amiodarone (PACERONE) 100 MG tablet Take 100mg  daily 15 tablet 6  . apixaban (ELIQUIS) 5 MG TABS tablet Take 1 tablet (5 mg total) by mouth 2 (two) times daily. 60 tablet 0  . atorvastatin (LIPITOR) 40 MG tablet Take 40 mg by mouth daily.    . Calcium Carbonate-Vitamin D (OSCAL 500/200 D-3 PO) Take 1 tablet by mouth 2 (two) times daily.    [name] diltiazem (CARDIZEM CD) 180 MG 24 hr capsule Take 1 capsule (180 mg total) by mouth daily. 30 capsule 11  . fluticasone (FLONASE) 50 MCG/ACT nasal spray Place 1 spray into both nostrils as needed.    . furosemide (LASIX) 80 MG tablet Take 0.5 tablets (40 mg total) by mouth daily. 60 tablet 11  . glimepiride (AMARYL) 4 MG tablet Take 4 mg by mouth 2 (two) times daily with a meal.    . levothyroxine (SYNTHROID, LEVOTHROID) 100 MCG tablet Take 1 tablet by mouth daily.    [name] LORazepam (ATIVAN) 1 MG tablet Take 1 mg by mouth at bedtime.     [name]  metoprolol succinate (TOPROL-XL) 100 MG 24 hr tablet Take 1/2 tablet once a day    . potassium chloride SA (K-DUR,KLOR-CON) 20 MEQ tablet Take 1 tablet (20 mEq total) by mouth daily. 60 tablet 11  . vitamin E 400 UNIT capsule Take 400 Units by mouth daily.     No current facility-administered medications for this encounter.    Allergies  Allergen Reactions  . Nsaids Other (See Comments)    Stomach cramps  . Sulfa Antibiotics Nausea And Vomiting and Other (See Comments)    GI upset  . Betadine [Povidone Iodine] Itching    Social History   Social History  . Marital Status: Married    Spouse Name: N/A  . Number of Children: N/A  . Years of Education: N/A   Occupational History  . Not on file.   Social History Main Topics  . Smoking status: Never Smoker   . Smokeless tobacco: Never Used  . Alcohol Use: No  . Drug Use: No  . Sexual Activity: Yes    Birth Control/ Protection: Surgical   Other Topics Concern  . Not on file   Social History Narrative    Family History    Problem Relation Age of Onset  . Pneumonia Mother   . Heart attack Father   . Diabetes Sister     ROS- All systems are reviewed and negative except as per the HPI above  Physical Exam: Filed Vitals:   12/16/14 1418  BP: 134/52  Pulse: 58  Height: 5\' 2"  (1.575 m)  Weight: 167 lb 12.8 oz (76.114 kg)    GEN- The patient is not in any distress, alert and oriented x 3 today.   Head- normocephalic, atraumatic Eyes-  Sclera clear, conjunctiva pink Ears- hearing intact Oropharynx- clear Neck- supple, no JVP Lymph- no cervical lymphadenopathy Lungs- Clear to ausculation bilaterally, normal work of breathing Heart- regular rate and rhythm, no murmurs, rubs or gallops, PMI not laterally displaced GI- soft, NT, ND, + BS Extremities- no clubbing, cyanosis, or edema MS- no significant deformity or atrophy Skin- no rash or lesion Psych- euthymic mood, full affect Neuro- strength and sensation are intact  EKG-  Sinus brady at 58 bpm, first degree AVB, cannot rule out anterior infarct age undetermined, Pr int 248 ms, QRSD ms 94 ms, QTc 447 ms.  Assessment and Plan:  1. Persistent afib Failed cardioversion x 3 Now in sinus brady   Continue amiodarone at 100 mg a day Continue Cardizem 180 mg a day Decrease metoprolol 100 mg to 1/2 tab a day  2. GI distress/ indigestion,N/V, anorexia Resolved with change form pradaxa to eliquis   3. Chronic diastolic HF Weight stable  4.HTN Controlled  F/u afib clinic  In 4 weeks   Geroge Baseman. Brae Gartman, [facility] [address], [place] [address] [phone]

## 2014-12-16 NOTE — Patient Instructions (Signed)
Your physician has recommended you make the following change in your medication:  1)Decrease metoprolol (toprol) to 50mg  once a day (1/2 tablet) once a day

## 2014-12-22 ENCOUNTER — Other Ambulatory Visit: Payer: Self-pay | Admitting: *Deleted

## 2014-12-22 MED ORDER — APIXABAN 5 MG PO TABS: 5.0000 mg | ORAL_TABLET | Freq: Two times a day (BID) | ORAL | Status: DC

## 2014-12-24 ENCOUNTER — Other Ambulatory Visit (HOSPITAL_COMMUNITY): Payer: Self-pay | Admitting: *Deleted

## 2014-12-24 MED ORDER — APIXABAN 5 MG PO TABS: 5.0000 mg | ORAL_TABLET | Freq: Two times a day (BID) | ORAL | Status: DC

## 2014-12-27 ENCOUNTER — Ambulatory Visit: Payer: Self-pay

## 2014-12-28 ENCOUNTER — Telehealth: Payer: Self-pay

## 2014-12-28 NOTE — Telephone Encounter (Signed)
Prior auth for Eliquis 5 mg sent to [facility]. 

## 2014-12-28 NOTE — Telephone Encounter (Signed)
Prior auth for Eliquis sent to [facility]

## 2014-12-29 ENCOUNTER — Telehealth: Payer: Self-pay

## 2014-12-29 NOTE — Telephone Encounter (Signed)
Eliquis approved through [facility]. Good for 1 year. [id number]. Pharmacy notified.

## 2014-12-30 ENCOUNTER — Other Ambulatory Visit: Payer: Self-pay

## 2014-12-30 NOTE — Patient Outreach (Signed)
unsuccessful attempt made to contact patient. Will make another attempt tomorrow, September 30

## 2015-01-18 ENCOUNTER — Ambulatory Visit (HOSPITAL_COMMUNITY)
Admission: RE | Admit: 2015-01-18 | Discharge: 2015-01-18 | Disposition: A | Discharge status: Home / Self Care | Payer: Medicare Other | Source: Ambulatory Visit | Attending: Nurse Practitioner | Admitting: Nurse Practitioner

## 2015-01-18 ENCOUNTER — Encounter (HOSPITAL_COMMUNITY): Payer: Self-pay | Admitting: Nurse Practitioner

## 2015-01-18 VITALS — BP 154/54 | HR 66 | Ht 62.0 in | Wt 169.8 lb

## 2015-01-18 DIAGNOSIS — I481 Persistent atrial fibrillation: Secondary | ICD-10-CM | POA: Diagnosis not present

## 2015-01-18 DIAGNOSIS — K30 Functional dyspepsia: Secondary | ICD-10-CM | POA: Insufficient documentation

## 2015-01-18 DIAGNOSIS — I5032 Chronic diastolic (congestive) heart failure: Secondary | ICD-10-CM | POA: Insufficient documentation

## 2015-01-18 DIAGNOSIS — I110 Hypertensive heart disease with heart failure: Secondary | ICD-10-CM | POA: Insufficient documentation

## 2015-01-18 DIAGNOSIS — I4819 Other persistent atrial fibrillation: Secondary | ICD-10-CM

## 2015-01-18 NOTE — Patient Instructions (Signed)
Your physician has recommended you make the following change in your medication:  1) -- if you notice ankle swelling -- take whole pill of lasix (80mg ) for 3 days. If swelling doesn't go down then call [phone]

## 2015-01-18 NOTE — Progress Notes (Signed)
Patient ID: Ann Fletcher, female   DOB: May 31, 1932, 81 y.o.   MRN: [id number]      Primary Care Physician: [name], MD Referring Physician: Melina Copa, PA-C Cardiologist: Dr. Eulah [name] is a 81 y.o. female with a h/o HTN, DM, HLD, lower extremity edema, hypothyroidism and recently diagnosed PAF/acute diastolic CHF who presents for f/u in the afib clinic. She has h/o right carotid bruit with prior carotid US showing mild plaque only bilaterally. She had nuclear stress test in December 2015 which was negative with EF 77%. She was admitted to [facility] 7/7-7/15 with AF RVR. She had recently been diagnosed by PCP the week prior to admission after presenting with SOB. She was placed on Toprol and Pradaxa. Unfortunately, despite good outpatient effort, patient's heart rate continued to be elevated. She was seen by cardiology service and was admitted for planned TEE/DCCV. TSH and troponins were normal. Echocardiogram showed EF 50-55%, mild MR, severe TR, moderately dilated left atrium. On exam, she appeared fluid overloaded and was aggressively diuresed with IV diuresis. Her diltiazem was changed to IV form to allow for easier titration which was eventually transition to 240mg  daily. She was continued on 100mg  daily Toprol XL. She underwent TEE cardioversion on 10/08/2014 and received 3 synchronous shocks which were unsuccessful in converting her. Post procedure, she was started on IV amiodarone with the intention of repeat DC cardioversion after loading. After 7 days of loading with amiodarone 400 mg twice a day, she underwent repeat DC cardioversion on 10/15/2014 under propofol. She was successfully converted from atrial fibrillation to atrial tachycardia with PACs after 3 attempts. It appears the last note during her hospitalization indicates she was back in atrial fib but with lower rate prior to discharge. Amiodarone was decreased to 200mg  BID with plans to reduce further to 200mg  daily  at 1 month (~11/15/14). Discharge weight 171lbs (diuresed >7L during her admission). Of note CBC showed Hgb 11.0, microcytic with MCV 77.5. No recent LFTs.  When  by [name], PA, 7/29, she was feeling well. She was still in afib, HR around 100. She denied any CP, SOB, palpitations, dizziness or syncope. Her LEE has been minimal. Baseline weight was stable.  She denied LEE, orthopnea, PND. She is following a low-salt diet.   When initially seen in afib clinic, 8/5, she was still  in afib, HR around 100. She denies any CP, SOB, palpitations, dizziness or syncope. Her LEE is minimal. Baseline weight is hovering around 168lbs which she says is where she has been since discharge. Bmet was repeated and showed increase in creat and bun. She was asked to reduce lasix to 1/2 tab of 80 mg lasix daily. She denies LEE, orthopnea, PND.  Denied any bleeding. I discussed with Dr. [name] and he suggested since she has been on amiodarone now x one month that it would be reasonable to try one more DCCV. If she does not stay in SR, then rate control with low dose amiodarone may be indicated.  She was scheduled for DCCV on 8/15 and did convert to SR. However, unfortunately,  did have ERAF. She stated that she had n/v on Thursday,denied blood in the vomitus and denies blood in stool. Now no further vomiting but states her stomach stays upset after taking pradaxa and her appetitive has not been as good. Will try switching her to eliquis and see if she tolerates better.Also reduced amiodarone  to 200 mg  Dr. [name] suggested that she may  need low dose amiodarone to help with rate control. She does not have complaints of palpitations, weight is stable, down 2 lbs from last visit.  She was seen by [name] 8/30 and found to be in a junctional rhythm. Amiodarone dose was decreased to 100 mg daily and cardizem dose was cut in half. She was is in sinus brady with first degree AV block. She feels well, no further stomach complaints.  Weight is stable.  She returns 10/18 feeling well. Weight is up per pt(2 lbs) due to appetitive being good. Ankles are swollen and we discussed that she could increase lasix to a full tablet(80) mg if needed x 3 days only then return to 1/2 pill a day. In SR, has not noticed any afib. She is being compliant with blood thinner.  Today, she denies symptoms of palpitations, chest pain, shortness of breath, orthopnea, PND,  Positive for mild lower extremity edema,  No dizziness, presyncope, syncope, or neurologic sequela. Positive for recent GI complaints, vomiting last week resolved with ongoing stomach upset and nausea.   Past Medical History  Diagnosis Date  . Osteoporosis   . Hypothyroidism   . Hyperlipidemia   . Arthritis   . Hypertension   . Lower extremity edema   . Persistent atrial fibrillation ([place])     a. diagnosed in 10/2014 by PCP, underwent failed TEE DCCV on 10/08/14, loaded with amio, successfully DCCV on 10/15/14.  . Diabetes mellitus without complication ([place])     type 2  . Anxiety   . H/O bladder infections   . Chronic diastolic CHF (congestive heart failure) ([place])     a. Acute exacerbation occurred in setting of AF.   Past Surgical History  Procedure Laterality Date  . Back surgery  2001, 2003, 2006    x 3  . Replacement total knee Right 07/2008  . Tubal ligation    . Tee without cardioversion N/A 10/08/2014    Procedure: TRANSESOPHAGEAL ECHOCARDIOGRAM (TEE);  Surgeon: [name], MD;  Location: [facility];  Service: Cardiovascular;  Laterality: N/A;  . Cardioversion N/A 10/08/2014    Procedure: CARDIOVERSION;  Surgeon: [name], MD;  Location: [facility];  Service: Cardiovascular;  Laterality: N/A;  . Cardioversion N/A 10/15/2014    Procedure: CARDIOVERSION;  Surgeon: [name], MD;  Location: [facility];  Service: Cardiovascular;  Laterality: N/A;  . Cardioversion N/A 11/15/2014    Procedure: CARDIOVERSION;  Surgeon: [name], MD;  Location: [facility]  ENDOSCOPY;  Service: Cardiovascular;  Laterality: N/A;    Current Outpatient Prescriptions  Medication Sig Dispense Refill  . albuterol (PROVENTIL HFA;VENTOLIN HFA) 108 (90 BASE) MCG/ACT inhaler Inhale 2 puffs into the lungs 3 (three) times daily as needed for wheezing.    [name] amiodarone (PACERONE) 100 MG tablet Take 100mg  daily 15 tablet 6  . apixaban (ELIQUIS) 5 MG TABS tablet Take 1 tablet (5 mg total) by mouth 2 (two) times daily. 60 tablet 11  . atorvastatin (LIPITOR) 40 MG tablet Take 40 mg by mouth daily.    . Calcium Carbonate-Vitamin D (OSCAL 500/200 D-3 PO) Take 1 tablet by mouth 2 (two) times daily.    [name] diltiazem (CARDIZEM CD) 180 MG 24 hr capsule Take 1 capsule (180 mg total) by mouth daily. 30 capsule 11  . fluticasone (FLONASE) 50 MCG/ACT nasal spray Place 1 spray into both nostrils as needed.    . furosemide (LASIX) 80 MG tablet Take 0.5 tablets (40 mg total) by mouth daily. 60 tablet  11  . glimepiride (AMARYL) 4 MG tablet Take 4 mg by mouth 2 (two) times daily with a meal.    . levothyroxine (SYNTHROID, LEVOTHROID) 100 MCG tablet Take 1 tablet by mouth daily.    [name] LORazepam (ATIVAN) 1 MG tablet Take 1 mg by mouth at bedtime.     . metoprolol succinate (TOPROL-XL) 100 MG 24 hr tablet Take 1/2 tablet once a day    . potassium chloride SA (K-DUR,KLOR-CON) 20 MEQ tablet Take 1 tablet (20 mEq total) by mouth daily. 60 tablet 11  . vitamin E 400 UNIT capsule Take 400 Units by mouth daily.     No current facility-administered medications for this encounter.    Allergies  Allergen Reactions  . Nsaids Other (See Comments)    Stomach cramps  . Sulfa Antibiotics Nausea And Vomiting and Other (See Comments)    GI upset  . Betadine [Povidone Iodine] Itching    Social History   Social History  . Marital Status: Married    Spouse Name: N/A  . Number of Children: N/A  . Years of Education: N/A   Occupational History  . Not on file.   Social History Main Topics  . Smoking  status: Never Smoker   . Smokeless tobacco: Never Used  . Alcohol Use: No  . Drug Use: No  . Sexual Activity: Yes    Birth Control/ Protection: Surgical   Other Topics Concern  . Not on file   Social History Narrative    Family History  Problem Relation Age of Onset  . Pneumonia Mother   . Heart attack Father   . Diabetes Sister     ROS- All systems are reviewed and negative except as per the HPI above  Physical Exam: Filed Vitals:   01/18/15 1428  BP: 154/54  Pulse: 66  Height: 5\' 2"  (1.575 m)  Weight: 169 lb 12.8 oz (77.021 kg)    GEN- The patient is not in any distress, alert and oriented x 3 today.   Head- normocephalic, atraumatic Eyes-  Sclera clear, conjunctiva pink Ears- hearing intact Oropharynx- clear Neck- supple, no JVP Lymph- no cervical lymphadenopathy Lungs- Clear to ausculation bilaterally, normal work of breathing Heart- regular rate and rhythm, no murmurs, rubs or gallops, PMI not laterally displaced GI- soft, NT, ND, + BS Extremities- no clubbing, cyanosis, or edema MS- no significant deformity or atrophy Skin- no rash or lesion Psych- euthymic mood, full affect Neuro- strength and sensation are intact  EKG-  Sinus rhythm at 66 bpm, first degree AVB, cannot rule out anterior infarct age undetermined, Pr int 288 ms, QRS int  92 ms, QTc 467 ms.  Assessment and Plan:  1. Persistent afib Failed cardioversion x 3 Now in sinus brady   Continue amiodarone at 100 mg a day Continue Cardizem 180 mg a day Metoprolol 100 mg to 1/2 tab a day  2. GI distress/ indigestion,N/V, anorexia Resolved with change form pradaxa to eliquis  Appetitive back to normal  3. Chronic diastolic HF Weight stable Has noticed mild amount LLE over the last few days Can increase lasix to 80 mg a day x 3 days then resume 40 mg a day  4.HTN stable  F/u afib clinic  in 3 months Tsh/liver to be drawn at that time.   Geroge Baseman Sharline Lehane, [facility] [address] [place], [place] [address] [phone]

## 2015-01-24 DIAGNOSIS — Z23 Encounter for immunization: Secondary | ICD-10-CM | POA: Diagnosis not present

## 2015-01-24 DIAGNOSIS — Z0000 Encounter for general adult medical examination without abnormal findings: Secondary | ICD-10-CM | POA: Diagnosis not present

## 2015-01-24 DIAGNOSIS — E1165 Type 2 diabetes mellitus with hyperglycemia: Secondary | ICD-10-CM | POA: Diagnosis not present

## 2015-01-24 DIAGNOSIS — I10 Essential (primary) hypertension: Secondary | ICD-10-CM | POA: Diagnosis not present

## 2015-01-24 DIAGNOSIS — E782 Mixed hyperlipidemia: Secondary | ICD-10-CM | POA: Diagnosis not present

## 2015-02-08 ENCOUNTER — Other Ambulatory Visit: Payer: Self-pay

## 2015-02-10 NOTE — Patient Outreach (Signed)
[facility]) Care Management  02/10/2015  Ann Fletcher February 28, 1933 [id number]   Notification from Erenest Rasher, RN to close case due to goals met with [name].  Thanks, [name]. [name], [name] Assistant Phone: [phone] Fax: [phone]

## 2015-03-24 ENCOUNTER — Ambulatory Visit (INDEPENDENT_AMBULATORY_CARE_PROVIDER_SITE_OTHER): Payer: Medicare Other | Admitting: Cardiology

## 2015-03-24 ENCOUNTER — Encounter: Payer: Self-pay | Admitting: Cardiology

## 2015-03-24 VITALS — BP 148/70 | HR 70 | Ht 63.0 in | Wt 171.5 lb

## 2015-03-24 DIAGNOSIS — Z7901 Long term (current) use of anticoagulants: Secondary | ICD-10-CM | POA: Diagnosis not present

## 2015-03-24 DIAGNOSIS — E1129 Type 2 diabetes mellitus with other diabetic kidney complication: Secondary | ICD-10-CM | POA: Insufficient documentation

## 2015-03-24 DIAGNOSIS — E785 Hyperlipidemia, unspecified: Secondary | ICD-10-CM | POA: Diagnosis not present

## 2015-03-24 DIAGNOSIS — I10 Essential (primary) hypertension: Secondary | ICD-10-CM

## 2015-03-24 DIAGNOSIS — E1122 Type 2 diabetes mellitus with diabetic chronic kidney disease: Secondary | ICD-10-CM | POA: Diagnosis not present

## 2015-03-24 DIAGNOSIS — I480 Paroxysmal atrial fibrillation: Secondary | ICD-10-CM | POA: Diagnosis not present

## 2015-03-24 DIAGNOSIS — N181 Chronic kidney disease, stage 1: Secondary | ICD-10-CM

## 2015-03-24 NOTE — Assessment & Plan Note (Signed)
Controlled.  

## 2015-03-24 NOTE — Assessment & Plan Note (Signed)
She had nausea and vomiting on Pradaxa, no on Eliquis and tolerating this well.

## 2015-03-24 NOTE — Progress Notes (Signed)
03/24/2015 Ann Fletcher   [birth year]  [id number]  Primary Physician [name], MD Primary Cardiologist: Dr Earney Navy in AF clinic  HPI:  81 y/o female with PAF. She had a low risk Myoview in Dec 2015, normal LVF with moderate LAE July 2016. She was ultimately cardioverted on Amiodarone to NSR. When seen in follow up August 30th she was in junctional rhythm. Her Amiodarone and Diltiazem were decreased. She saw Roderic Palau in October and was having nausea and vomiting. This improved when she was changed from Pradaxa to Eliquis. She is in the office today for follow up. She in NSR with long 1st degree AVB- PR 290. She is otherwise doing well, no further nausea, no unusual dyspnea, no syncope.    Current Outpatient Prescriptions  Medication Sig Dispense Refill  . albuterol (PROVENTIL HFA;VENTOLIN HFA) 108 (90 BASE) MCG/ACT inhaler Inhale 2 puffs into the lungs 3 (three) times daily as needed for wheezing.    [name] amiodarone (PACERONE) 200 MG tablet Take 1 tablet by mouth daily. Take 1 tab daily    . apixaban (ELIQUIS) 5 MG TABS tablet Take 1 tablet (5 mg total) by mouth 2 (two) times daily. 60 tablet 11  . atorvastatin (LIPITOR) 40 MG tablet Take 40 mg by mouth daily.    . Calcium Carbonate-Vitamin D (OSCAL 500/200 D-3 PO) Take 1 tablet by mouth 2 (two) times daily.    [name] diltiazem (CARDIZEM CD) 180 MG 24 hr capsule Take 1 capsule (180 mg total) by mouth daily. 30 capsule 11  . fluticasone (FLONASE) 50 MCG/ACT nasal spray Place 1 spray into both nostrils as needed.    . furosemide (LASIX) 80 MG tablet Take 0.5 tablets (40 mg total) by mouth daily. 60 tablet 11  . glimepiride (AMARYL) 4 MG tablet Take 4 mg by mouth 2 (two) times daily with a meal.    . levothyroxine (SYNTHROID, LEVOTHROID) 100 MCG tablet Take 1 tablet by mouth daily.    [name] LORazepam (ATIVAN) 1 MG tablet Take 1 mg by mouth at bedtime.     . metoprolol succinate (TOPROL-XL) 100 MG 24 hr tablet Take 1/2 tablet  once a day    . potassium chloride SA (K-DUR,KLOR-CON) 20 MEQ tablet Take 1 tablet (20 mEq total) by mouth daily. 60 tablet 11  . vitamin E 400 UNIT capsule Take 400 Units by mouth daily.     No current facility-administered medications for this visit.    Allergies  Allergen Reactions  . Pradaxa [Dabigatran Etexilate Mesylate] Nausea And Vomiting  . Nsaids Other (See Comments)    Stomach cramps  . Sulfa Antibiotics Nausea And Vomiting and Other (See Comments)    GI upset  . Betadine [Povidone Iodine] Itching    Social History   Social History  . Marital Status: Married    Spouse Name: N/A  . Number of Children: N/A  . Years of Education: N/A   Occupational History  . Not on file.   Social History Main Topics  . Smoking status: Never Smoker   . Smokeless tobacco: Never Used  . Alcohol Use: No  . Drug Use: No  . Sexual Activity: Yes    Birth Control/ Protection: Surgical   Other Topics Concern  . Not on file   Social History Narrative     Review of Systems: General: negative for chills, fever, night sweats or weight changes.  Cardiovascular: negative for chest pain, dyspnea on exertion, edema, orthopnea, palpitations, paroxysmal nocturnal dyspnea or  shortness of breath Dermatological: negative for rash Respiratory: negative for cough or wheezing Urologic: negative for hematuria Abdominal: negative for nausea, vomiting, diarrhea, bright red blood per rectum, melena, or hematemesis Neurologic: negative for visual changes, syncope, or dizziness All other systems reviewed and are otherwise negative except as noted above.    Blood pressure 148/70, pulse 70, height 5\' 3"  (1.6 m), weight 171 lb 8 oz (77.792 kg).  General appearance: alert, cooperative, no distress and mildly obese Neck: no JVD Lungs: clear to auscultation bilaterally Heart: regular rate and rhythm Skin: Skin color, texture, turgor normal. No rashes or lesions Neurologic: Grossly normal  EKG NSR,  63, 1st degree AVB-PR 290  ASSESSMENT AND PLAN:   PAF (paroxysmal atrial fibrillation) (HCC) NSR on low dose Amiodarone, s/p DCCV Aug 2016  Anticoagulated She had nausea and vomiting on Pradaxa, no on Eliquis and tolerating this well.  Type 2 diabetes mellitus with renal manifestations, controlled (HCC) GFR 38, on Amaryl  Hyperlipidemia On statin Rx, no recent LFTs or lipid panel in Epic  Essential hypertension Controlled   PLAN  She is on Amiodarone 200 mg daily, this was supposed to be decreased to 100 mg daily and I did that today. I asked her to bring all her medications to her next OV. She needs f/u CMET, TSH, and lipids. F/U with AF clinic in 3 months, f/u Dr [name] one year.   Kerin Ransom K PA-C 03/24/2015 12:10 PM

## 2015-03-24 NOTE — Patient Instructions (Addendum)
Your physician recommends that you schedule a follow-up appointment in: March with Roderic Palau and 1 year with dr [name].  Your physician recommends that you return for fasting lab work at your convenience  Your physician has recommended you make the following change in your medication: decrease the amiodarone to 100 mg daily. ( 1/2 tablet)

## 2015-03-24 NOTE — Assessment & Plan Note (Signed)
GFR 38, on Amaryl

## 2015-03-24 NOTE — Assessment & Plan Note (Signed)
On statin Rx, no recent LFTs or lipid panel in Epic

## 2015-03-24 NOTE — Assessment & Plan Note (Signed)
NSR on low dose Amiodarone, s/p DCCV Aug 2016

## 2015-04-13 NOTE — Addendum Note (Signed)
Addended by: Therisa Doyne on: 04/13/2015 04:24 PM   Modules accepted: Orders

## 2015-04-18 ENCOUNTER — Ambulatory Visit (INDEPENDENT_AMBULATORY_CARE_PROVIDER_SITE_OTHER): Payer: Medicare Other | Admitting: Sports Medicine

## 2015-04-18 ENCOUNTER — Encounter: Payer: Self-pay | Admitting: Sports Medicine

## 2015-04-18 DIAGNOSIS — B351 Tinea unguium: Secondary | ICD-10-CM

## 2015-04-18 DIAGNOSIS — M79676 Pain in unspecified toe(s): Secondary | ICD-10-CM | POA: Diagnosis not present

## 2015-04-18 DIAGNOSIS — E1121 Type 2 diabetes mellitus with diabetic nephropathy: Secondary | ICD-10-CM | POA: Diagnosis not present

## 2015-04-18 NOTE — Progress Notes (Signed)
Patient ID: Ann Fletcher, female   DOB: [birth year], 82 y.o.   MRN: [id number] Subjective: Ann Fletcher is a 82 y.o. female patient with history of type 2 diabetes who presents to office today complaining of long, painful nails  while ambulating in shoes; unable to trim. Patient states that the glucose reading this morning was 140 mg/dl. Patient denies any new changes in medication or new problems. Patient denies any new cramping, numbness, burning or tingling in the legs.  Patient Active Problem List   Diagnosis Date Noted  . PAF (paroxysmal atrial fibrillation) ([place]) 03/24/2015  . Type 2 diabetes mellitus with renal manifestations, controlled ([place]) 03/24/2015  . Anticoagulated 10/13/2014  . Atrial fibrillation with RVR ([place] [place]) 10/07/2014  . Essential hypertension 02/17/2014  . Hyperlipidemia 02/17/2014  . Lower extremity edema 02/17/2014  . Right carotid bruit 02/17/2014   Current Outpatient Prescriptions on File Prior to Visit  Medication Sig Dispense Refill  . albuterol (PROVENTIL HFA;VENTOLIN HFA) 108 (90 BASE) MCG/ACT inhaler Inhale 2 puffs into the lungs 3 (three) times daily as needed for wheezing.    [name] amiodarone (PACERONE) 200 MG tablet Take 0.5 tablets by mouth daily.    [name] apixaban (ELIQUIS) 5 MG TABS tablet Take 1 tablet (5 mg total) by mouth 2 (two) times daily. 60 tablet 11  . atorvastatin (LIPITOR) 40 MG tablet Take 40 mg by mouth daily.    . Calcium Carbonate-Vitamin D (OSCAL 500/200 D-3 PO) Take 1 tablet by mouth 2 (two) times daily.    [name] diltiazem (CARDIZEM CD) 180 MG 24 hr capsule Take 1 capsule (180 mg total) by mouth daily. 30 capsule 11  . fluticasone (FLONASE) 50 MCG/ACT nasal spray Place 1 spray into both nostrils as needed.    . furosemide (LASIX) 80 MG tablet Take 0.5 tablets (40 mg total) by mouth daily. 60 tablet 11  . glimepiride (AMARYL) 4 MG tablet Take 4 mg by mouth 2 (two) times daily with a meal.    . levothyroxine (SYNTHROID, LEVOTHROID) 100 MCG tablet Take  1 tablet by mouth daily.    [name] LORazepam (ATIVAN) 1 MG tablet Take 1 mg by mouth at bedtime.     . metoprolol succinate (TOPROL-XL) 100 MG 24 hr tablet Take 1/2 tablet once a day    . potassium chloride SA (K-DUR,KLOR-CON) 20 MEQ tablet Take 1 tablet (20 mEq total) by mouth daily. 60 tablet 11  . vitamin E 400 UNIT capsule Take 400 Units by mouth daily.     No current facility-administered medications on file prior to visit.   Allergies  Allergen Reactions  . Pradaxa [Dabigatran Etexilate Mesylate] Nausea And Vomiting  . Nsaids Other (See Comments)    Stomach cramps  . Sulfa Antibiotics Nausea And Vomiting and Other (See Comments)    GI upset  . Betadine [Povidone Iodine] Itching    Labs: HEMOGLOBIN A1C- No recent lab on file  Objective: General: Patient is awake, alert, and oriented x 3 and in no acute distress.  Integument: Skin is warm, dry and supple bilateral. Nails are tender, long, thickened and  dystrophic with subungual debris, consistent with onychomycosis, 2-5 bilateral. Patient has no nails on the big toes. No signs of infection. No open lesions or preulcerative lesions present bilateral. There is a mild asymptomatic callus at the distal medial aspect of left 5th toe with no signs of infection. Remaining integument unremarkable.  Vasculature:  Dorsalis Pedis pulse 2/4 bilateral. Posterior Tibial pulse  1/4 bilateral.  Capillary fill  time <3 sec 1-5 bilateral. Scant hair growth to the level of the digits. Temperature gradient within normal limits. No varicosities present bilateral. No edema present bilateral.   Neurology: The patient has intact sensation measured with a 5.07/10g Semmes Weinstein Monofilament at all pedal sites bilateral . Vibratory sensation intact bilateral with tuning fork. No Babinski sign present bilateral.   Musculoskeletal: Asymptomatic bunion pedal deformities noted bilateral. Muscular strength 5/5 in all lower extremity muscular groups bilateral  without pain on range of motion . No tenderness with calf compression bilateral.  Assessment and Plan: Problem List Items Addressed This Visit      Endocrine   Type 2 diabetes mellitus with renal manifestations, controlled ([place])    Other Visit Diagnoses    Pain due to onychomycosis of toenail    -  Primary      -Examined patient. -Discussed and educated patient on diabetic foot care, especially with  regards to the vascular, neurological and musculoskeletal systems.  -Stressed the importance of good glycemic control and the detriment of not  controlling glucose levels in relation to the foot. -Mechanically debrided all nails 2-5 bilateral using sterile nail nipper and filed with dremel without incident  -Answered all patient questions -Patient to return in 3 months for foot care -Patient advised to call the office if any problems or questions arise in the meantime.  Landis Martins, DPM

## 2015-04-20 ENCOUNTER — Ambulatory Visit (HOSPITAL_COMMUNITY): Payer: Self-pay | Admitting: Nurse Practitioner

## 2015-05-19 ENCOUNTER — Other Ambulatory Visit: Payer: Self-pay

## 2015-05-23 DIAGNOSIS — E1165 Type 2 diabetes mellitus with hyperglycemia: Secondary | ICD-10-CM | POA: Diagnosis not present

## 2015-05-23 DIAGNOSIS — I10 Essential (primary) hypertension: Secondary | ICD-10-CM | POA: Diagnosis not present

## 2015-05-23 DIAGNOSIS — E782 Mixed hyperlipidemia: Secondary | ICD-10-CM | POA: Diagnosis not present

## 2015-05-30 DIAGNOSIS — J4520 Mild intermittent asthma, uncomplicated: Secondary | ICD-10-CM | POA: Diagnosis not present

## 2015-05-30 DIAGNOSIS — E1165 Type 2 diabetes mellitus with hyperglycemia: Secondary | ICD-10-CM | POA: Diagnosis not present

## 2015-05-30 DIAGNOSIS — I10 Essential (primary) hypertension: Secondary | ICD-10-CM | POA: Diagnosis not present

## 2015-05-30 DIAGNOSIS — E782 Mixed hyperlipidemia: Secondary | ICD-10-CM | POA: Diagnosis not present

## 2015-06-16 ENCOUNTER — Ambulatory Visit (HOSPITAL_COMMUNITY)
Admission: RE | Admit: 2015-06-16 | Discharge: 2015-06-16 | Disposition: A | Discharge status: Home / Self Care | Payer: Medicare Other | Source: Ambulatory Visit | Attending: Nurse Practitioner | Admitting: Nurse Practitioner

## 2015-06-16 ENCOUNTER — Encounter (HOSPITAL_COMMUNITY): Payer: Self-pay | Admitting: Nurse Practitioner

## 2015-06-16 VITALS — BP 138/58 | HR 65 | Ht 63.0 in | Wt 175.0 lb

## 2015-06-16 DIAGNOSIS — Z888 Allergy status to other drugs, medicaments and biological substances status: Secondary | ICD-10-CM | POA: Diagnosis not present

## 2015-06-16 DIAGNOSIS — I110 Hypertensive heart disease with heart failure: Secondary | ICD-10-CM | POA: Diagnosis not present

## 2015-06-16 DIAGNOSIS — Z7984 Long term (current) use of oral hypoglycemic drugs: Secondary | ICD-10-CM | POA: Insufficient documentation

## 2015-06-16 DIAGNOSIS — Z833 Family history of diabetes mellitus: Secondary | ICD-10-CM | POA: Diagnosis not present

## 2015-06-16 DIAGNOSIS — I5032 Chronic diastolic (congestive) heart failure: Secondary | ICD-10-CM | POA: Diagnosis not present

## 2015-06-16 DIAGNOSIS — E785 Hyperlipidemia, unspecified: Secondary | ICD-10-CM | POA: Diagnosis not present

## 2015-06-16 DIAGNOSIS — Z8249 Family history of ischemic heart disease and other diseases of the circulatory system: Secondary | ICD-10-CM | POA: Diagnosis not present

## 2015-06-16 DIAGNOSIS — E119 Type 2 diabetes mellitus without complications: Secondary | ICD-10-CM | POA: Diagnosis not present

## 2015-06-16 DIAGNOSIS — I481 Persistent atrial fibrillation: Secondary | ICD-10-CM | POA: Insufficient documentation

## 2015-06-16 DIAGNOSIS — M1990 Unspecified osteoarthritis, unspecified site: Secondary | ICD-10-CM | POA: Insufficient documentation

## 2015-06-16 DIAGNOSIS — Z882 Allergy status to sulfonamides status: Secondary | ICD-10-CM | POA: Insufficient documentation

## 2015-06-16 DIAGNOSIS — Z79899 Other long term (current) drug therapy: Secondary | ICD-10-CM | POA: Insufficient documentation

## 2015-06-16 DIAGNOSIS — Z7901 Long term (current) use of anticoagulants: Secondary | ICD-10-CM | POA: Insufficient documentation

## 2015-06-16 DIAGNOSIS — I480 Paroxysmal atrial fibrillation: Secondary | ICD-10-CM

## 2015-06-16 DIAGNOSIS — E039 Hypothyroidism, unspecified: Secondary | ICD-10-CM | POA: Insufficient documentation

## 2015-06-16 DIAGNOSIS — I440 Atrioventricular block, first degree: Secondary | ICD-10-CM | POA: Insufficient documentation

## 2015-06-16 LAB — COMPREHENSIVE METABOLIC PANEL
ALT: 34 U/L (ref 14–54)
AST: 47 U/L — ABNORMAL HIGH (ref 15–41)
Albumin: 3.6 g/dL (ref 3.5–5.0)
Alkaline Phosphatase: 63 U/L (ref 38–126)
Anion gap: 8 (ref 5–15)
BUN: 24 mg/dL — ABNORMAL HIGH (ref 6–20)
CO2: 26 mmol/L (ref 22–32)
Calcium: 9.5 mg/dL (ref 8.9–10.3)
Chloride: 106 mmol/L (ref 101–111)
Creatinine, Ser: 0.87 mg/dL (ref 0.44–1.00)
GFR calc Af Amer: >60 mL/min (ref >60)
GFR calc non Af Amer: >60 mL/min (ref >60)
Glucose, Bld: 202 mg/dL — ABNORMAL HIGH (ref 65–99)
Potassium: 5.6 mmol/L — ABNORMAL HIGH (ref 3.5–5.1)
Sodium: 140 mmol/L (ref 135–145)
Total Bilirubin: 0.6 mg/dL (ref 0.3–1.2)
Total Protein: 7.6 g/dL (ref 6.5–8.1)

## 2015-06-16 LAB — TSH: TSH: 2.198 u[IU]/mL (ref 0.350–4.500)

## 2015-06-16 NOTE — Progress Notes (Signed)
Patient ID: DIJON SUPPA, female   DOB: 10-11-1932, 82 y.o.   MRN: [id number]      Primary Care Physician: [name], MD Referring Physician: Melina Copa, PA-C Cardiologist: Dr. Eulah [name] is a 82 y.o. female with a h/o HTN, DM, HLD, lower extremity edema, hypothyroidism and recently diagnosed PAF/acute diastolic CHF who presents for f/u in the afib clinic. She has h/o right carotid bruit with prior carotid US showing mild plaque only bilaterally. She had nuclear stress test in December 2015 which was negative with EF 77%. She was admitted to [facility] 7/7-7/15 with AF RVR. She had recently been diagnosed by PCP the week prior to admission after presenting with SOB. She was placed on Toprol and Pradaxa. Unfortunately, despite good outpatient effort, patient's heart rate continued to be elevated. She was seen by cardiology service and was admitted for planned TEE/DCCV. TSH and troponins were normal. Echocardiogram showed EF 50-55%, mild MR, severe TR, moderately dilated left atrium. On exam, she appeared fluid overloaded and was aggressively diuresed with IV diuresis. Her diltiazem was changed to IV form to allow for easier titration which was eventually transition to 240mg  daily. She was continued on 100mg  daily Toprol XL. She underwent TEE cardioversion on 10/08/2014 and received 3 synchronous shocks which were unsuccessful in converting her. Post procedure, she was started on IV amiodarone with the intention of repeat DC cardioversion after loading. After 7 days of loading with amiodarone 400 mg twice a day, she underwent repeat DC cardioversion on 10/15/2014 under propofol. She was successfully converted from atrial fibrillation to atrial tachycardia with PACs after 3 attempts. It appears the last note during her hospitalization indicates she was back in atrial fib but with lower rate prior to discharge. Amiodarone was decreased to 200mg  BID with plans to reduce further to 200mg  daily  at 1 month (~11/15/14). Discharge weight 171lbs (diuresed >7L during her admission). Of note CBC showed Hgb 11.0, microcytic with MCV 77.5. No recent LFTs.  When seen  by [name], PA, 7/29, she was feeling well. She was still in afib, HR around 100. She denied any CP, SOB, palpitations, dizziness or syncope. Her LEE has been minimal. Baseline weight was stable.  She denied LEE, orthopnea, PND. She is following a low-salt diet.   When initially seen in afib clinic, 8/5, she was still  in afib, HR around 100. She denies any CP, SOB, palpitations, dizziness or syncope. Her LEE is minimal. Baseline weight is hovering around 168lbs which she says is where she has been since discharge. Bmet was repeated and showed increase in creat and bun. She was asked to reduce lasix to 1/2 tab of 80 mg lasix daily. She denies LEE, orthopnea, PND.  Denied any bleeding. I discussed with Dr. [name] and he suggested since she has been on amiodarone now x one month that it would be reasonable to try one more DCCV. If she does not stay in SR, then rate control with low dose amiodarone may be indicated.  She was scheduled for DCCV on 8/15 and did convert to SR. However, unfortunately,  did have ERAF. She stated that she had n/v on Thursday,denied blood in the vomitus and denies blood in stool. Now no further vomiting but states her stomach stays upset after taking pradaxa and her appetitive has not been as good. Will try switching her to eliquis and see if she tolerates better.Also reduced amiodarone  to 200 mg  Dr. [name] suggested that she  may need low dose amiodarone to help with rate control. She does not have complaints of palpitations, weight is stable, down 2 lbs from last visit.  She was seen by [name] 8/30 and found to be in a junctional rhythm. Amiodarone dose was decreased to 100 mg daily and cardizem dose was cut in half. She was is in sinus brady with first degree AV block. She feels well, no further stomach  complaints. Weight is stable.  She returns 10/18 feeling well. Weight is up per pt(2 lbs) due to appetitive being good. Ankles are swollen and we discussed that she could increase lasix to a full tablet(80) mg if needed x 3 days only then return to 1/2 pill a day. In SR, has not noticed any afib. She is being compliant with blood thinner.   Today, she denies symptoms of palpitations, chest pain, shortness of breath, orthopnea, PND,  Positive for mild lower extremity edema,  No dizziness, presyncope, syncope, or neurologic sequela.  Past Medical History  Diagnosis Date  . Osteoporosis   . Hypothyroidism   . Hyperlipidemia   . Arthritis   . Hypertension   . Lower extremity edema   . Persistent atrial fibrillation ([place])     a. diagnosed in 10/2014 by PCP, underwent failed TEE DCCV on 10/08/14, loaded with amio, successfully DCCV on 10/15/14.  . Diabetes mellitus without complication ([place])     type 2  . Anxiety   . H/O bladder infections   . Chronic diastolic CHF (congestive heart failure) ([place])     a. Acute exacerbation occurred in setting of AF.   Past Surgical History  Procedure Laterality Date  . Back surgery  2001, 2003, 2006    x 3  . Replacement total knee Right 07/2008  . Tubal ligation    . Tee without cardioversion N/A 10/08/2014    Procedure: TRANSESOPHAGEAL ECHOCARDIOGRAM (TEE);  Surgeon: [name], MD;  Location: [place];  Service: Cardiovascular;  Laterality: N/A;  . Cardioversion N/A 10/08/2014    Procedure: CARDIOVERSION;  Surgeon: [name], MD;  Location: [place];  Service: Cardiovascular;  Laterality: N/A;  . Cardioversion N/A 10/15/2014    Procedure: CARDIOVERSION;  Surgeon: [name], MD;  Location: [place];  Service: Cardiovascular;  Laterality: N/A;  . Cardioversion N/A 11/15/2014    Procedure: CARDIOVERSION;  Surgeon: [name], MD;  Location: [facility];  Service: Cardiovascular;  Laterality: N/A;    Current Outpatient  Prescriptions  Medication Sig Dispense Refill  . albuterol (PROVENTIL HFA;VENTOLIN HFA) 108 (90 BASE) MCG/ACT inhaler Inhale 2 puffs into the lungs 3 (three) times daily as needed for wheezing.    [name] amiodarone (PACERONE) 200 MG tablet Take 0.5 tablets by mouth daily.    [name] apixaban (ELIQUIS) 5 MG TABS tablet Take 1 tablet (5 mg total) by mouth 2 (two) times daily. 60 tablet 11  . atorvastatin (LIPITOR) 40 MG tablet Take 40 mg by mouth daily.    . Calcium Carbonate-Vitamin D (OSCAL 500/200 D-3 PO) Take 1 tablet by mouth 2 (two) times daily.    [name] diltiazem (CARDIZEM CD) 180 MG 24 hr capsule Take 1 capsule (180 mg total) by mouth daily. 30 capsule 11  . fluticasone (FLONASE) 50 MCG/ACT nasal spray Place 1 spray into both nostrils as needed.    . furosemide (LASIX) 80 MG tablet Take 0.5 tablets (40 mg total) by mouth daily. 60 tablet 11  . glimepiride (AMARYL) 4 MG tablet Take 4 mg by  mouth 2 (two) times daily with a meal.    . levothyroxine (SYNTHROID, LEVOTHROID) 100 MCG tablet Take 1 tablet by mouth daily.    [name] LORazepam (ATIVAN) 1 MG tablet Take 1 mg by mouth at bedtime.     . metoprolol succinate (TOPROL-XL) 100 MG 24 hr tablet Take 1/2 tablet once a day    . potassium chloride SA (K-DUR,KLOR-CON) 20 MEQ tablet Take 1 tablet (20 mEq total) by mouth daily. 60 tablet 11  . vitamin E 400 UNIT capsule Take 400 Units by mouth daily.     No current facility-administered medications for this encounter.    Allergies  Allergen Reactions  . Pradaxa [Dabigatran Etexilate Mesylate] Nausea And Vomiting  . Nsaids Other (See Comments)    Stomach cramps  . Sulfa Antibiotics Nausea And Vomiting and Other (See Comments)    GI upset  . Betadine [Povidone Iodine] Itching    Social History   Social History  . Marital Status: Married    Spouse Name: N/A  . Number of Children: N/A  . Years of Education: N/A   Occupational History  . Not on file.   Social History Main Topics  . Smoking status:  Never Smoker   . Smokeless tobacco: Never Used  . Alcohol Use: No  . Drug Use: No  . Sexual Activity: Yes    Birth Control/ Protection: Surgical   Other Topics Concern  . Not on file   Social History Narrative    Family History  Problem Relation Age of Onset  . Pneumonia Mother   . Heart attack Father   . Diabetes Sister     ROS- All systems are reviewed and negative except as per the HPI above  Physical Exam: Filed Vitals:   06/16/15 1311  BP: 138/58  Pulse: 65  Height: 5\' 3"  (1.6 m)  Weight: 175 lb (79.379 kg)    GEN- The patient is not in any distress, alert and oriented x 3 today.   Head- normocephalic, atraumatic Eyes-  Sclera clear, conjunctiva pink Ears- hearing intact Oropharynx- clear Neck- supple, no JVP Lymph- no cervical lymphadenopathy Lungs- Clear to ausculation bilaterally, normal work of breathing Heart- regular rate and rhythm, no murmurs, rubs or gallops, PMI not laterally displaced GI- soft, NT, ND, + BS Extremities- no clubbing, cyanosis, or edema MS- no significant deformity or atrophy Skin- no rash or lesion Psych- euthymic mood, full affect Neuro- strength and sensation are intact  EKG-  Sinus rhythm at 66 bpm, first degree AVB   Assessment and Plan:  1. Persistent afib Now in sinus with first degree block Continue amiodarone at 100 mg a day  Continue Cardizem 180 mg a day Metoprolol 100 mg, 1/2 tab a day Cmet,TSH today  2. GI distress/ indigestion,N/V, anorexia Resolved with change form pradaxa to eliquis  Appetitive back to normal  3. Chronic diastolic HF Weight stable  4.HTN stable  F/u afib clinic  in 4 months    Butch Penny C. Devanta Daniel, [facility] [address] [place], [place] [address] [phone]

## 2015-06-23 ENCOUNTER — Ambulatory Visit (HOSPITAL_COMMUNITY)
Admission: RE | Admit: 2015-06-23 | Discharge: 2015-06-23 | Disposition: A | Discharge status: Home / Self Care | Payer: Medicare Other | Source: Ambulatory Visit | Attending: Nurse Practitioner | Admitting: Nurse Practitioner

## 2015-06-23 DIAGNOSIS — I4891 Unspecified atrial fibrillation: Secondary | ICD-10-CM | POA: Diagnosis not present

## 2015-06-23 LAB — BASIC METABOLIC PANEL
Anion gap: 11 (ref 5–15)
BUN: 21 mg/dL — ABNORMAL HIGH (ref 6–20)
CO2: 25 mmol/L (ref 22–32)
Calcium: 10 mg/dL (ref 8.9–10.3)
Chloride: 103 mmol/L (ref 101–111)
Creatinine, Ser: 0.87 mg/dL (ref 0.44–1.00)
GFR calc Af Amer: >60 mL/min (ref >60)
GFR calc non Af Amer: >60 mL/min (ref >60)
Glucose, Bld: 219 mg/dL — ABNORMAL HIGH (ref 65–99)
Potassium: 4.4 mmol/L (ref 3.5–5.1)
Sodium: 139 mmol/L (ref 135–145)

## 2015-07-18 ENCOUNTER — Ambulatory Visit: Payer: Medicare Other | Admitting: Sports Medicine

## 2015-09-26 DIAGNOSIS — I10 Essential (primary) hypertension: Secondary | ICD-10-CM | POA: Diagnosis not present

## 2015-09-26 DIAGNOSIS — E1165 Type 2 diabetes mellitus with hyperglycemia: Secondary | ICD-10-CM | POA: Diagnosis not present

## 2015-09-26 DIAGNOSIS — E782 Mixed hyperlipidemia: Secondary | ICD-10-CM | POA: Diagnosis not present

## 2015-10-03 DIAGNOSIS — E1165 Type 2 diabetes mellitus with hyperglycemia: Secondary | ICD-10-CM | POA: Diagnosis not present

## 2015-10-03 DIAGNOSIS — E782 Mixed hyperlipidemia: Secondary | ICD-10-CM | POA: Diagnosis not present

## 2015-10-17 DIAGNOSIS — E1165 Type 2 diabetes mellitus with hyperglycemia: Secondary | ICD-10-CM | POA: Diagnosis not present

## 2015-10-20 ENCOUNTER — Encounter (HOSPITAL_COMMUNITY): Payer: Self-pay | Admitting: Nurse Practitioner

## 2015-10-20 ENCOUNTER — Ambulatory Visit (HOSPITAL_COMMUNITY)
Admission: RE | Admit: 2015-10-20 | Discharge: 2015-10-20 | Disposition: A | Discharge status: Home / Self Care | Payer: Medicare Other | Source: Ambulatory Visit | Attending: Nurse Practitioner | Admitting: Nurse Practitioner

## 2015-10-20 VITALS — BP 156/60 | HR 62 | Ht 63.0 in | Wt 168.8 lb

## 2015-10-20 DIAGNOSIS — I481 Persistent atrial fibrillation: Secondary | ICD-10-CM | POA: Diagnosis not present

## 2015-10-20 DIAGNOSIS — E039 Hypothyroidism, unspecified: Secondary | ICD-10-CM | POA: Insufficient documentation

## 2015-10-20 DIAGNOSIS — Z7901 Long term (current) use of anticoagulants: Secondary | ICD-10-CM | POA: Insufficient documentation

## 2015-10-20 DIAGNOSIS — I110 Hypertensive heart disease with heart failure: Secondary | ICD-10-CM | POA: Diagnosis not present

## 2015-10-20 DIAGNOSIS — Z79899 Other long term (current) drug therapy: Secondary | ICD-10-CM | POA: Insufficient documentation

## 2015-10-20 DIAGNOSIS — R9431 Abnormal electrocardiogram [ECG] [EKG]: Secondary | ICD-10-CM | POA: Diagnosis not present

## 2015-10-20 DIAGNOSIS — E785 Hyperlipidemia, unspecified: Secondary | ICD-10-CM | POA: Insufficient documentation

## 2015-10-20 DIAGNOSIS — I4819 Other persistent atrial fibrillation: Secondary | ICD-10-CM

## 2015-10-20 DIAGNOSIS — Z7984 Long term (current) use of oral hypoglycemic drugs: Secondary | ICD-10-CM | POA: Insufficient documentation

## 2015-10-20 DIAGNOSIS — E119 Type 2 diabetes mellitus without complications: Secondary | ICD-10-CM | POA: Insufficient documentation

## 2015-10-20 DIAGNOSIS — F419 Anxiety disorder, unspecified: Secondary | ICD-10-CM | POA: Diagnosis not present

## 2015-10-20 DIAGNOSIS — I4891 Unspecified atrial fibrillation: Secondary | ICD-10-CM | POA: Diagnosis present

## 2015-10-20 DIAGNOSIS — M1990 Unspecified osteoarthritis, unspecified site: Secondary | ICD-10-CM | POA: Insufficient documentation

## 2015-10-20 DIAGNOSIS — I5032 Chronic diastolic (congestive) heart failure: Secondary | ICD-10-CM | POA: Diagnosis not present

## 2015-10-20 DIAGNOSIS — I440 Atrioventricular block, first degree: Secondary | ICD-10-CM | POA: Diagnosis not present

## 2015-10-20 LAB — CBC
HCT: 35.3 % — ABNORMAL LOW (ref 36.0–46.0)
Hemoglobin: 11.3 g/dL — ABNORMAL LOW (ref 12.0–15.0)
MCH: 26.7 pg (ref 26.0–34.0)
MCHC: 32 g/dL (ref 30.0–36.0)
MCV: 83.3 fL (ref 78.0–100.0)
Platelets: 179 10*3/uL (ref 150–400)
RBC: 4.24 MIL/uL (ref 3.87–5.11)
RDW: 14.8 % (ref 11.5–15.5)
WBC: 9.2 10*3/uL (ref 4.0–10.5)

## 2015-10-20 LAB — COMPREHENSIVE METABOLIC PANEL
ALT: 32 U/L (ref 14–54)
AST: 41 U/L (ref 15–41)
Albumin: 3.7 g/dL (ref 3.5–5.0)
Alkaline Phosphatase: 56 U/L (ref 38–126)
Anion gap: 5 (ref 5–15)
BUN: 25 mg/dL — ABNORMAL HIGH (ref 6–20)
CO2: 28 mmol/L (ref 22–32)
Calcium: 9.6 mg/dL (ref 8.9–10.3)
Chloride: 107 mmol/L (ref 101–111)
Creatinine, Ser: 1.17 mg/dL — ABNORMAL HIGH (ref 0.44–1.00)
GFR calc Af Amer: 49 mL/min — ABNORMAL LOW (ref >60)
GFR calc non Af Amer: 43 mL/min — ABNORMAL LOW (ref >60)
Glucose, Bld: 129 mg/dL — ABNORMAL HIGH (ref 65–99)
Potassium: 5.3 mmol/L — ABNORMAL HIGH (ref 3.5–5.1)
Sodium: 140 mmol/L (ref 135–145)
Total Bilirubin: 0.4 mg/dL (ref 0.3–1.2)
Total Protein: 7.6 g/dL (ref 6.5–8.1)

## 2015-10-20 LAB — TSH: TSH: 0.793 u[IU]/mL (ref 0.350–4.500)

## 2015-10-20 MED ORDER — POTASSIUM CHLORIDE CRYS ER 20 MEQ PO TBCR: 20.0000 meq | EXTENDED_RELEASE_TABLET | Freq: Two times a day (BID) | ORAL | Status: DC

## 2015-10-20 MED ORDER — FUROSEMIDE 80 MG PO TABS: 80.0000 mg | ORAL_TABLET | Freq: Every day | ORAL | Status: DC

## 2015-10-20 MED ORDER — METOPROLOL SUCCINATE ER 100 MG PO TB24: ORAL_TABLET | ORAL | Status: DC

## 2015-10-20 NOTE — Patient Instructions (Signed)
Follow up with [name] in December 2017 and Roderic Palau NP in March 2018

## 2015-10-20 NOTE — Progress Notes (Signed)
Patient ID: Ann Fletcher, female   DOB: 1932/08/04, 82 y.o.   MRN: [id number]      Primary Care Physician: [name], MD Referring Physician: Melina Copa, PA-C Cardiologist: Dr. Eulah [name] is a 82 y.o. female with a h/o HTN, DM, HLD, lower extremity edema, hypothyroidism and recently diagnosed PAF/acute diastolic CHF who presents for f/u in the afib clinic. She has h/o right carotid bruit with prior carotid US showing mild plaque only bilaterally. She had nuclear stress test in December 2015 which was negative with EF 77%. She was admitted to [facility] 7/7-7/15 with AF RVR. She had recently been diagnosed by PCP the week prior to admission after presenting with SOB. She was placed on Toprol and Pradaxa. Unfortunately, despite good outpatient effort, patient's heart rate continued to be elevated. She was seen by cardiology service and was admitted for planned TEE/DCCV. TSH and troponins were normal. Echocardiogram showed EF 50-55%, mild MR, severe TR, moderately dilated left atrium. On exam, she appeared fluid overloaded and was aggressively diuresed with IV diuresis. Her diltiazem was changed to IV form to allow for easier titration which was eventually transition to 240mg  daily. She was continued on 100mg  daily Toprol XL. She underwent TEE cardioversion on 10/08/2014 and received 3 synchronous shocks which were unsuccessful in converting her. Post procedure, she was started on IV amiodarone with the intention of repeat DC cardioversion after loading. After 7 days of loading with amiodarone 400 mg twice a day, she underwent repeat DC cardioversion on 10/15/2014 under propofol. She was successfully converted from atrial fibrillation to atrial tachycardia with PACs after 3 attempts. It appears the last note during her hospitalization indicates she was back in atrial fib but with lower rate prior to discharge. Amiodarone was decreased to 200mg  BID with plans to reduce further to 200mg  daily  at 1 month (~11/15/14). Discharge weight 171lbs (diuresed >7L during her admission). Of note CBC showed Hgb 11.0, microcytic with MCV 77.5. No recent LFTs.  When seen  by [name], PA, 7/29, she was feeling well. She was still in afib, HR around 100. She denied any CP, SOB, palpitations, dizziness or syncope. Her LEE has been minimal. Baseline weight was stable.  She denied LEE, orthopnea, PND. She is following a low-salt diet.   When initially seen in afib clinic, 8/5, she was still  in afib, HR around 100. She denies any CP, SOB, palpitations, dizziness or syncope. Her LEE is minimal. Baseline weight is hovering around 168lbs which she says is where she has been since discharge. Bmet was repeated and showed increase in creat and bun. She was asked to reduce lasix to 1/2 tab of 80 mg lasix daily. She denies LEE, orthopnea, PND.  Denied any bleeding. I discussed with Dr. [name] and he suggested since she has been on amiodarone now x one month that it would be reasonable to try one more DCCV. If she does not stay in SR, then rate control with low dose amiodarone may be indicated.  She was scheduled for DCCV on 8/15 and did convert to SR. However, unfortunately,  did have ERAF. She stated that she had n/v on Thursday,denied blood in the vomitus and denies blood in stool. Now no further vomiting but states her stomach stays upset after taking pradaxa and her appetitive has not been as good. Will try switching her to eliquis and see if she tolerates better.Also reduced amiodarone  to 200 mg  Dr. [name] suggested that she  may need low dose amiodarone to help with rate control. She does not have complaints of palpitations, weight is stable, down 2 lbs from last visit.  She was seen by [name] 8/30 and found to be in a junctional rhythm. Amiodarone dose was decreased to 100 mg daily and cardizem dose was cut in half. She was is in sinus brady with first degree AV block. She feels well, no further stomach  complaints. Weight is stable.  She returns 10/18 feeling well. Weight is up per pt(2 lbs) due to appetitive being good. Ankles are swollen and we discussed that she could increase lasix to a full tablet(80) mg if needed x 3 days only then return to 1/2 pill a day. In SR, has not noticed any afib. She is being compliant with blood thinner.  F/u afib clinic 7/20 and has no complaints. She is staying in [place] is stable. No unusual shortness of breath. Being compliant  with eliquis, no bleeding issues. Metformin bid made her nauseated so she takes only once a day. Her husband has had Bypass surgery since she was seen here last and did well.  Today, she denies symptoms of palpitations, chest pain, shortness of breath, orthopnea, PND,  Positive for mild lower extremity edema,  No dizziness, presyncope, syncope, or neurologic sequela.  Past Medical History  Diagnosis Date  . Osteoporosis   . Hypothyroidism   . Hyperlipidemia   . Arthritis   . Hypertension   . Lower extremity edema   . Persistent atrial fibrillation ([place])     a. diagnosed in 10/2014 by PCP, underwent failed TEE DCCV on 10/08/14, loaded with amio, successfully DCCV on 10/15/14.  . Diabetes mellitus without complication ([place])     type 2  . Anxiety   . H/O bladder infections   . Chronic diastolic CHF (congestive heart failure) ([place])     a. Acute exacerbation occurred in setting of AF.   Past Surgical History  Procedure Laterality Date  . Back surgery  2001, 2003, 2006    x 3  . Replacement total knee Right 07/2008  . Tubal ligation    . Tee without cardioversion N/A 10/08/2014    Procedure: TRANSESOPHAGEAL ECHOCARDIOGRAM (TEE);  Surgeon: [name], MD;  Location: [place];  Service: Cardiovascular;  Laterality: N/A;  . Cardioversion N/A 10/08/2014    Procedure: CARDIOVERSION;  Surgeon: [name], MD;  Location: [place];  Service: Cardiovascular;  Laterality: N/A;  . Cardioversion N/A 10/15/2014    Procedure:  CARDIOVERSION;  Surgeon: [name], MD;  Location: [place];  Service: Cardiovascular;  Laterality: N/A;  . Cardioversion N/A 11/15/2014    Procedure: CARDIOVERSION;  Surgeon: [name], MD;  Location: [facility];  Service: Cardiovascular;  Laterality: N/A;    Current Outpatient Prescriptions  Medication Sig Dispense Refill  . albuterol (PROVENTIL HFA;VENTOLIN HFA) 108 (90 BASE) MCG/ACT inhaler Inhale 2 puffs into the lungs 3 (three) times daily as needed for wheezing.    [name] amiodarone (PACERONE) 200 MG tablet Take 0.5 tablets by mouth daily.    [name] apixaban (ELIQUIS) 5 MG TABS tablet Take 1 tablet (5 mg total) by mouth 2 (two) times daily. 60 tablet 11  . atorvastatin (LIPITOR) 40 MG tablet Take 40 mg by mouth daily.    . Calcium Carbonate-Vitamin D (OSCAL 500/200 D-3 PO) Take 1 tablet by mouth 2 (two) times daily.    [name] diltiazem (CARDIZEM CD) 180 MG 24 hr capsule Take 1 capsule (180  mg total) by mouth daily. 30 capsule 11  . fluticasone (FLONASE) 50 MCG/ACT nasal spray Place 1 spray into both nostrils as needed.    . furosemide (LASIX) 80 MG tablet Take 0.5 tablets (40 mg total) by mouth daily. (Patient taking differently: Take 80 mg by mouth daily. ) 60 tablet 11  . glimepiride (AMARYL) 4 MG tablet Take 4 mg by mouth daily with breakfast.     . levothyroxine (SYNTHROID, LEVOTHROID) 100 MCG tablet Take 1 tablet by mouth daily.    [name] LORazepam (ATIVAN) 1 MG tablet Take 1 mg by mouth at bedtime.     . metFORMIN (GLUCOPHAGE) 500 MG tablet Take 500 mg by mouth daily with breakfast.    . metoprolol succinate (TOPROL-XL) 100 MG 24 hr tablet Take 1/2 tablet once a day (Patient taking differently: Pt taking one tablet daily)    . potassium chloride SA (K-DUR,KLOR-CON) 20 MEQ tablet Take 1 tablet (20 mEq total) by mouth daily. (Patient taking differently: Take 20 mEq by mouth 2 (two) times daily. ) 60 tablet 11  . vitamin E 400 UNIT capsule Take 400 Units by mouth daily.     No current  facility-administered medications for this encounter.    Allergies  Allergen Reactions  . Pradaxa [Dabigatran Etexilate Mesylate] Nausea And Vomiting  . Nsaids Other (See Comments)    Stomach cramps  . Sulfa Antibiotics Nausea And Vomiting and Other (See Comments)    GI upset  . Betadine [Povidone Iodine] Itching    Social History   Social History  . Marital Status: Married    Spouse Name: N/A  . Number of Children: N/A  . Years of Education: N/A   Occupational History  . Not on file.   Social History Main Topics  . Smoking status: Never Smoker   . Smokeless tobacco: Never Used  . Alcohol Use: No  . Drug Use: No  . Sexual Activity: Yes    Birth Control/ Protection: Surgical   Other Topics Concern  . Not on file   Social History Narrative    Family History  Problem Relation Age of Onset  . Pneumonia Mother   . Heart attack Father   . Diabetes Sister     ROS- All systems are reviewed and negative except as per the HPI above  Physical Exam: Filed Vitals:   10/20/15 1336  BP: 156/60  Pulse: 62  Height: 5\' 3"  (1.6 m)  Weight: 168 lb 12.8 oz (76.567 kg)    GEN- The patient is not in any distress, alert and oriented x 3 today.   Head- normocephalic, atraumatic Eyes-  Sclera clear, conjunctiva pink Ears- hearing intact Oropharynx- clear Neck- supple, no JVP Lymph- no cervical lymphadenopathy Lungs- Clear to ausculation bilaterally, normal work of breathing Heart- regular rate and rhythm, no murmurs, rubs or gallops, PMI not laterally displaced GI- soft, NT, ND, + BS Extremities- no clubbing, cyanosis, or edema MS- no significant deformity or atrophy Skin- no rash or lesion Psych- euthymic mood, full affect Neuro- strength and sensation are intact  EKG-  Sinus rhythm at 62 bpm, first degree AVB, pr int 320 ms, qrs int 98 ms, qtc 460 ms Epic records reviewed  Assessment and Plan:  1. Persistent afib Now in sinus with first degree block Continue  amiodarone at 100 mg a day  Continue Cardizem 180 mg a day Metoprolol 100 mg  Cmet,TSH, CBC today  2. GI distress/ indigestion,N/V, anorexia Resolved with change form pradaxa to eliquis  Also, nausea with metformin bid and is better with once daily dosing  3. Chronic diastolic HF Weight stable Fluid status stable   F/u afib clinic  in 9 months Sees [name] in December    [place]. Kuzey Ogata, [facility] [address] [place], [place] [address] [phone]

## 2015-11-10 ENCOUNTER — Ambulatory Visit (HOSPITAL_COMMUNITY)
Admission: RE | Admit: 2015-11-10 | Discharge: 2015-11-10 | Disposition: A | Discharge status: Home / Self Care | Payer: Medicare Other | Source: Ambulatory Visit | Attending: Nurse Practitioner | Admitting: Nurse Practitioner

## 2015-11-10 ENCOUNTER — Other Ambulatory Visit: Payer: Self-pay

## 2015-11-10 DIAGNOSIS — I4891 Unspecified atrial fibrillation: Secondary | ICD-10-CM

## 2015-11-10 DIAGNOSIS — I481 Persistent atrial fibrillation: Secondary | ICD-10-CM | POA: Insufficient documentation

## 2015-11-10 LAB — BASIC METABOLIC PANEL
Anion gap: 7 (ref 5–15)
BUN: 20 mg/dL (ref 6–20)
CO2: 27 mmol/L (ref 22–32)
Calcium: 9.6 mg/dL (ref 8.9–10.3)
Chloride: 104 mmol/L (ref 101–111)
Creatinine, Ser: 0.89 mg/dL (ref 0.44–1.00)
GFR calc Af Amer: >60 mL/min (ref >60)
GFR calc non Af Amer: 59 mL/min — ABNORMAL LOW (ref >60)
Glucose, Bld: 153 mg/dL — ABNORMAL HIGH (ref 65–99)
Potassium: 4.5 mmol/L (ref 3.5–5.1)
Sodium: 138 mmol/L (ref 135–145)

## 2015-11-10 MED ORDER — POTASSIUM CHLORIDE CRYS ER 20 MEQ PO TBCR: 20.0000 meq | EXTENDED_RELEASE_TABLET | Freq: Two times a day (BID) | ORAL | 4 refills | Status: DC

## 2015-11-14 DIAGNOSIS — E1165 Type 2 diabetes mellitus with hyperglycemia: Secondary | ICD-10-CM | POA: Diagnosis not present

## 2015-11-17 DIAGNOSIS — Z0000 Encounter for general adult medical examination without abnormal findings: Secondary | ICD-10-CM | POA: Diagnosis not present

## 2015-11-17 DIAGNOSIS — E1165 Type 2 diabetes mellitus with hyperglycemia: Secondary | ICD-10-CM | POA: Diagnosis not present

## 2015-11-21 ENCOUNTER — Other Ambulatory Visit: Payer: Self-pay | Admitting: *Deleted

## 2015-11-21 ENCOUNTER — Other Ambulatory Visit: Payer: Self-pay | Admitting: Cardiovascular Disease

## 2015-11-21 MED ORDER — FUROSEMIDE 80 MG PO TABS: 80.0000 mg | ORAL_TABLET | Freq: Every day | ORAL | 11 refills | Status: DC

## 2015-11-21 NOTE — Telephone Encounter (Signed)
Rx request sent to pharmacy.  

## 2015-12-12 ENCOUNTER — Other Ambulatory Visit: Payer: Self-pay | Admitting: Cardiovascular Disease

## 2015-12-12 NOTE — Telephone Encounter (Signed)
REFILL 

## 2016-01-03 ENCOUNTER — Other Ambulatory Visit (HOSPITAL_COMMUNITY): Payer: Self-pay | Admitting: Nurse Practitioner

## 2016-01-10 DIAGNOSIS — E782 Mixed hyperlipidemia: Secondary | ICD-10-CM | POA: Diagnosis not present

## 2016-01-10 DIAGNOSIS — E559 Vitamin D deficiency, unspecified: Secondary | ICD-10-CM | POA: Diagnosis not present

## 2016-01-10 DIAGNOSIS — I10 Essential (primary) hypertension: Secondary | ICD-10-CM | POA: Diagnosis not present

## 2016-01-10 DIAGNOSIS — E1165 Type 2 diabetes mellitus with hyperglycemia: Secondary | ICD-10-CM | POA: Diagnosis not present

## 2016-01-10 DIAGNOSIS — E039 Hypothyroidism, unspecified: Secondary | ICD-10-CM | POA: Diagnosis not present

## 2016-01-10 DIAGNOSIS — I4891 Unspecified atrial fibrillation: Secondary | ICD-10-CM | POA: Diagnosis not present

## 2016-01-10 DIAGNOSIS — Z0000 Encounter for general adult medical examination without abnormal findings: Secondary | ICD-10-CM | POA: Diagnosis not present

## 2016-01-13 ENCOUNTER — Other Ambulatory Visit: Payer: Self-pay | Admitting: Cardiovascular Disease

## 2016-01-17 DIAGNOSIS — E039 Hypothyroidism, unspecified: Secondary | ICD-10-CM | POA: Diagnosis not present

## 2016-01-17 DIAGNOSIS — Z0001 Encounter for general adult medical examination with abnormal findings: Secondary | ICD-10-CM | POA: Diagnosis not present

## 2016-01-17 DIAGNOSIS — Z131 Encounter for screening for diabetes mellitus: Secondary | ICD-10-CM | POA: Diagnosis not present

## 2016-01-17 DIAGNOSIS — Z23 Encounter for immunization: Secondary | ICD-10-CM | POA: Diagnosis not present

## 2016-01-17 DIAGNOSIS — M5416 Radiculopathy, lumbar region: Secondary | ICD-10-CM | POA: Diagnosis not present

## 2016-01-17 DIAGNOSIS — N390 Urinary tract infection, site not specified: Secondary | ICD-10-CM | POA: Diagnosis not present

## 2016-01-17 DIAGNOSIS — E1165 Type 2 diabetes mellitus with hyperglycemia: Secondary | ICD-10-CM | POA: Diagnosis not present

## 2016-01-17 DIAGNOSIS — E782 Mixed hyperlipidemia: Secondary | ICD-10-CM | POA: Diagnosis not present

## 2016-01-19 ENCOUNTER — Ambulatory Visit (HOSPITAL_COMMUNITY): Payer: Self-pay | Admitting: Nurse Practitioner

## 2016-02-17 ENCOUNTER — Ambulatory Visit (INDEPENDENT_AMBULATORY_CARE_PROVIDER_SITE_OTHER): Payer: Medicare Other | Admitting: Podiatry

## 2016-02-17 DIAGNOSIS — E1121 Type 2 diabetes mellitus with diabetic nephropathy: Secondary | ICD-10-CM

## 2016-02-17 DIAGNOSIS — M79676 Pain in unspecified toe(s): Secondary | ICD-10-CM | POA: Diagnosis not present

## 2016-02-17 DIAGNOSIS — B351 Tinea unguium: Secondary | ICD-10-CM

## 2016-02-17 NOTE — Progress Notes (Signed)
Complaint:  Visit Type: Patient returns to my office for continued preventative foot care services. Complaint: Patient states" my nails have grown long and thick and become painful to walk and wear shoes" Patient has been diagnosed with DM with no foot complications. The patient presents for preventative foot care services. No changes to ROS  Podiatric Exam: Vascular: dorsalis pedis and posterior tibial pulses are palpable bilateral. Capillary return is immediate. Temperature gradient is WNL. Skin turgor WNL  Sensorium: Normal Semmes Weinstein monofilament test. Normal tactile sensation bilaterally. Nail Exam: Pt has thick disfigured discolored nails with subungual debris noted bilateral all nails except hallux toes  B/L Ulcer Exam: There is no evidence of ulcer or pre-ulcerative changes or infection. Orthopedic Exam: Muscle tone and strength are WNL. No limitations in general ROM. No crepitus or effusions noted. Foot type and digits show no abnormalities. Bony prominences are unremarkable. Skin: No Porokeratosis. No infection or ulcers  Diagnosis:  Onychomycosis, , Pain in right toe, pain in left toes  Treatment & Plan Procedures and Treatment: Consent by patient was obtained for treatment procedures. The patient understood the discussion of treatment and procedures well. All questions were answered thoroughly reviewed. Debridement of mycotic and hypertrophic toenails, 1 through 5 bilateral and clearing of subungual debris. No ulceration, no infection noted.  Return Visit-Office Procedure: Patient instructed to return to the office for a follow up visit 3 months for continued evaluation and treatment.    Gardiner Barefoot DPM

## 2016-03-13 ENCOUNTER — Ambulatory Visit: Payer: Self-pay | Admitting: Cardiovascular Disease

## 2016-04-04 ENCOUNTER — Ambulatory Visit: Payer: Self-pay | Admitting: Cardiovascular Disease

## 2016-05-01 DIAGNOSIS — I10 Essential (primary) hypertension: Secondary | ICD-10-CM | POA: Diagnosis not present

## 2016-05-01 DIAGNOSIS — R600 Localized edema: Secondary | ICD-10-CM | POA: Diagnosis not present

## 2016-05-01 DIAGNOSIS — E118 Type 2 diabetes mellitus with unspecified complications: Secondary | ICD-10-CM | POA: Diagnosis not present

## 2016-05-01 DIAGNOSIS — E039 Hypothyroidism, unspecified: Secondary | ICD-10-CM | POA: Diagnosis not present

## 2016-05-03 ENCOUNTER — Telehealth: Payer: Self-pay | Admitting: Cardiovascular Disease

## 2016-05-08 ENCOUNTER — Ambulatory Visit: Payer: Self-pay | Admitting: Cardiovascular Disease

## 2016-05-18 ENCOUNTER — Ambulatory Visit: Payer: Medicare Other | Admitting: Podiatry

## 2016-05-22 ENCOUNTER — Encounter: Payer: Self-pay | Admitting: Cardiovascular Disease

## 2016-05-22 ENCOUNTER — Ambulatory Visit (INDEPENDENT_AMBULATORY_CARE_PROVIDER_SITE_OTHER): Payer: Medicare Other | Admitting: Cardiovascular Disease

## 2016-05-22 VITALS — BP 148/60 | HR 60 | Ht 62.0 in | Wt 167.6 lb

## 2016-05-22 DIAGNOSIS — I480 Paroxysmal atrial fibrillation: Secondary | ICD-10-CM | POA: Diagnosis not present

## 2016-05-22 DIAGNOSIS — E7800 Pure hypercholesterolemia, unspecified: Secondary | ICD-10-CM

## 2016-05-22 DIAGNOSIS — I10 Essential (primary) hypertension: Secondary | ICD-10-CM

## 2016-05-22 NOTE — Assessment & Plan Note (Signed)
History of hyperlipidemia on statin therapy followed by her PCP. 

## 2016-05-22 NOTE — Assessment & Plan Note (Signed)
History of hypertension with blood pressure measures 148/60. She is on Toprol and diltiazem. Continue current meds at current dosing

## 2016-05-22 NOTE — Progress Notes (Signed)
05/22/2016 Ann Fletcher   September 30, 1932  [id number]  Primary Physician [name], MD Primary Cardiologist: Lorretta Harp MD Renae Gloss  HPI:  Ann Fletcher is a 83 year old moderately overweight married Caucasian female whose Fletcher Ann Fletcher is also a patient of mine as well and he accompanies Ann today.. She is a mother of 3 and grandmother and 5 grandchildren. She was referred by Dr. Ashby Dawes and for preoperative clearance before elective back surgery. I last saw Ann in the office 11/30/14. Ann cardiac risk factor profile is notable for treated hypertension, diabetes and hyperlipidemia. She does have bilateral looks to me edema. There is no family history of heart disease. She's never had a heart attack or stroke. She denies chest pain or shortness of breath. She apparently has back issues which may require surgery and was referred here for cardiovascular surgical clearance. She was hospitalized at [facility] from July 7 through the 15th with A. Fib with RVR and m edema. She was diuresed, loaded with amiodarone and ultimately cardioverted by Dr. Marlou Porch successfully after 3 shocks which ultimately did not hold. She saw edema and time PA-C in the office 10/29/14 in follow-up. Ann heart rate at that time was 100. Today she feels somewhat weak and nauseated. Ann EKG shows junctional bradycardia 40. She is on Eliquis  . I'm decreased Ann amiodarone and referred Ann to the [name] where she was seen by Roderic Palau nurse practitioner. She currently is in sinus rhythm with first-degree AV block and is relatively asymptomatic.   Current Outpatient Prescriptions  Medication Sig Dispense Refill  . albuterol (PROVENTIL HFA;VENTOLIN HFA) 108 (90 BASE) MCG/ACT inhaler Inhale 2 puffs into the lungs 3 (three) times daily as needed for wheezing.    [name] amiodarone (PACERONE) 200 MG tablet TAKE ONE-HALF TABLET DAILY 45 tablet 2  . atorvastatin (LIPITOR) 40 MG tablet Take 40 mg by  mouth daily.    . Calcium Carbonate-Vitamin D (OSCAL 500/200 D-3 PO) Take 1 tablet by mouth 2 (two) times daily.    [name] diltiazem (CARDIZEM CD) 180 MG 24 hr capsule TAKE ONE CAPSULE EACH DAY 30 capsule 5  . ELIQUIS 5 MG TABS tablet TAKE ONE TABLET TWICE DAILY 60 tablet 6  . fluticasone (FLONASE) 50 MCG/ACT nasal spray Place 1 spray into both nostrils as needed.    . furosemide (LASIX) 80 MG tablet Take 1 tablet (80 mg total) by mouth daily. 60 tablet 11  . glimepiride (AMARYL) 4 MG tablet Take 4 mg by mouth daily with breakfast.     . levothyroxine (SYNTHROID, LEVOTHROID) 100 MCG tablet Take 1 tablet by mouth daily.    [name] LORazepam (ATIVAN) 1 MG tablet Take 1 mg by mouth at bedtime.     . metFORMIN (GLUCOPHAGE) 500 MG tablet Take 500 mg by mouth daily with breakfast.    . metoprolol succinate (TOPROL-XL) 100 MG 24 hr tablet Pt taking one tablet daily    . potassium chloride SA (K-DUR,KLOR-CON) 20 MEQ tablet Take 1 tablet (20 mEq total) by mouth 2 (two) times daily. 60 tablet 4  . vitamin E 400 UNIT capsule Take 400 Units by mouth daily.     No current facility-administered medications for this visit.     Allergies  Allergen Reactions  . Pradaxa [Dabigatran Etexilate Mesylate] Nausea And Vomiting  . Nsaids Other (See Comments)    Stomach cramps  . Sulfa Antibiotics Nausea And Vomiting and Other (See Comments)    GI upset  .  Betadine [Povidone Iodine] Itching    Social History   Social History  . Marital status: Married    Spouse name: N/A  . Number of children: N/A  . Years of education: N/A   Occupational History  . Not on file.   Social History Main Topics  . Smoking status: Never Smoker  . Smokeless tobacco: Never Used  . Alcohol use No  . Drug use: No  . Sexual activity: Yes    Birth control/ protection: Surgical   Other Topics Concern  . Not on file   Social History Narrative  . No narrative on file     Review of Systems: General: negative for chills, fever,  night sweats or weight changes.  Cardiovascular: negative for chest pain, dyspnea on exertion, edema, orthopnea, palpitations, paroxysmal nocturnal dyspnea or shortness of breath Dermatological: negative for rash Respiratory: negative for cough or wheezing Urologic: negative for hematuria Abdominal: negative for nausea, vomiting, diarrhea, bright red blood per rectum, melena, or hematemesis Neurologic: negative for visual changes, syncope, or dizziness All other systems reviewed and are otherwise negative except as noted above.    Blood pressure (!) 148/60, pulse 60, height 5\' 2"  (1.575 m), weight 167 lb 9.6 oz (76 kg).  General appearance: alert and no distress Neck: no adenopathy, no carotid bruit, no JVD, supple, symmetrical, trachea midline and thyroid not enlarged, symmetric, no tenderness/mass/nodules Lungs: clear to auscultation bilaterally Heart: regular rate and rhythm, S1, S2 normal, no murmur, click, rub or gallop Extremities: Trace ankle edema  EKG sinus rhythm at 60 with first-degree AV block and nonspecific ST-T wave changes. I personally reviewed this EKG  ASSESSMENT AND PLAN:   Essential hypertension History of hypertension with blood pressure measures 148/60. She is on Toprol and diltiazem. Continue current meds at current dosing  Hyperlipidemia History of hyperlipidemia on statin therapy followed by Ann PCP  PAF (paroxysmal atrial fibrillation) ([place]) History of PAF currently in sinus rhythm on amiodarone, Toprol and diltiazem with a ventricular response of 60 and first-degree AV block. She is on Eliquis ORAL anticoagulation.      Lorretta Harp MD FACP,FACC,FAHA, [facility] 05/22/2016 11:28 AM

## 2016-05-22 NOTE — Patient Instructions (Signed)

## 2016-05-22 NOTE — Assessment & Plan Note (Signed)
History of PAF currently in sinus rhythm on amiodarone, Toprol and diltiazem with a ventricular response of 60 and first-degree AV block. She is on Eliquis ORAL anticoagulation.

## 2016-05-24 ENCOUNTER — Other Ambulatory Visit: Payer: Self-pay | Admitting: Cardiovascular Disease

## 2016-06-01 ENCOUNTER — Ambulatory Visit: Payer: Medicare Other | Admitting: Podiatry

## 2016-06-19 ENCOUNTER — Encounter (HOSPITAL_COMMUNITY): Payer: Self-pay | Admitting: Nurse Practitioner

## 2016-06-19 ENCOUNTER — Ambulatory Visit (HOSPITAL_COMMUNITY)
Admission: RE | Admit: 2016-06-19 | Discharge: 2016-06-19 | Disposition: A | Discharge status: Home / Self Care | Payer: Medicare Other | Source: Ambulatory Visit | Attending: Nurse Practitioner | Admitting: Nurse Practitioner

## 2016-06-19 VITALS — BP 172/58 | HR 65 | Ht 62.0 in | Wt 164.5 lb

## 2016-06-19 DIAGNOSIS — M810 Age-related osteoporosis without current pathological fracture: Secondary | ICD-10-CM | POA: Diagnosis not present

## 2016-06-19 DIAGNOSIS — Z79899 Other long term (current) drug therapy: Secondary | ICD-10-CM | POA: Diagnosis not present

## 2016-06-19 DIAGNOSIS — R630 Anorexia: Secondary | ICD-10-CM | POA: Diagnosis not present

## 2016-06-19 DIAGNOSIS — Z882 Allergy status to sulfonamides status: Secondary | ICD-10-CM | POA: Insufficient documentation

## 2016-06-19 DIAGNOSIS — I481 Persistent atrial fibrillation: Secondary | ICD-10-CM | POA: Diagnosis not present

## 2016-06-19 DIAGNOSIS — Z833 Family history of diabetes mellitus: Secondary | ICD-10-CM | POA: Insufficient documentation

## 2016-06-19 DIAGNOSIS — F419 Anxiety disorder, unspecified: Secondary | ICD-10-CM | POA: Insufficient documentation

## 2016-06-19 DIAGNOSIS — Z96651 Presence of right artificial knee joint: Secondary | ICD-10-CM | POA: Insufficient documentation

## 2016-06-19 DIAGNOSIS — E119 Type 2 diabetes mellitus without complications: Secondary | ICD-10-CM | POA: Diagnosis not present

## 2016-06-19 DIAGNOSIS — Z888 Allergy status to other drugs, medicaments and biological substances status: Secondary | ICD-10-CM | POA: Diagnosis not present

## 2016-06-19 DIAGNOSIS — Z8249 Family history of ischemic heart disease and other diseases of the circulatory system: Secondary | ICD-10-CM | POA: Insufficient documentation

## 2016-06-19 DIAGNOSIS — E785 Hyperlipidemia, unspecified: Secondary | ICD-10-CM | POA: Insufficient documentation

## 2016-06-19 DIAGNOSIS — Z7951 Long term (current) use of inhaled steroids: Secondary | ICD-10-CM | POA: Diagnosis not present

## 2016-06-19 DIAGNOSIS — I110 Hypertensive heart disease with heart failure: Secondary | ICD-10-CM | POA: Insufficient documentation

## 2016-06-19 DIAGNOSIS — K30 Functional dyspepsia: Secondary | ICD-10-CM | POA: Insufficient documentation

## 2016-06-19 DIAGNOSIS — I480 Paroxysmal atrial fibrillation: Secondary | ICD-10-CM

## 2016-06-19 DIAGNOSIS — Z7901 Long term (current) use of anticoagulants: Secondary | ICD-10-CM | POA: Insufficient documentation

## 2016-06-19 DIAGNOSIS — R112 Nausea with vomiting, unspecified: Secondary | ICD-10-CM | POA: Diagnosis not present

## 2016-06-19 DIAGNOSIS — Z6830 Body mass index (BMI) 30.0-30.9, adult: Secondary | ICD-10-CM | POA: Insufficient documentation

## 2016-06-19 DIAGNOSIS — I5032 Chronic diastolic (congestive) heart failure: Secondary | ICD-10-CM | POA: Insufficient documentation

## 2016-06-19 DIAGNOSIS — E039 Hypothyroidism, unspecified: Secondary | ICD-10-CM | POA: Diagnosis not present

## 2016-06-19 DIAGNOSIS — Z7984 Long term (current) use of oral hypoglycemic drugs: Secondary | ICD-10-CM | POA: Diagnosis not present

## 2016-06-19 LAB — COMPREHENSIVE METABOLIC PANEL
ALT: 31 U/L (ref 14–54)
AST: 54 U/L — ABNORMAL HIGH (ref 15–41)
Albumin: 3.6 g/dL (ref 3.5–5.0)
Alkaline Phosphatase: 51 U/L (ref 38–126)
Anion gap: 12 (ref 5–15)
BUN: 26 mg/dL — ABNORMAL HIGH (ref 6–20)
CO2: 24 mmol/L (ref 22–32)
Calcium: 9.9 mg/dL (ref 8.9–10.3)
Chloride: 100 mmol/L — ABNORMAL LOW (ref 101–111)
Creatinine, Ser: 1.17 mg/dL — ABNORMAL HIGH (ref 0.44–1.00)
GFR calc Af Amer: 49 mL/min — ABNORMAL LOW (ref >60)
GFR calc non Af Amer: 43 mL/min — ABNORMAL LOW (ref >60)
Glucose, Bld: 208 mg/dL — ABNORMAL HIGH (ref 65–99)
Potassium: 4.7 mmol/L (ref 3.5–5.1)
Sodium: 136 mmol/L (ref 135–145)
Total Bilirubin: 0.7 mg/dL (ref 0.3–1.2)
Total Protein: 7.5 g/dL (ref 6.5–8.1)

## 2016-06-19 LAB — CBC
HCT: 33.8 % — ABNORMAL LOW (ref 36.0–46.0)
Hemoglobin: 10.4 g/dL — ABNORMAL LOW (ref 12.0–15.0)
MCH: 24.6 pg — ABNORMAL LOW (ref 26.0–34.0)
MCHC: 30.8 g/dL (ref 30.0–36.0)
MCV: 80.1 fL (ref 78.0–100.0)
Platelets: 204 10*3/uL (ref 150–400)
RBC: 4.22 MIL/uL (ref 3.87–5.11)
RDW: 16.1 % — ABNORMAL HIGH (ref 11.5–15.5)
WBC: 7.7 10*3/uL (ref 4.0–10.5)

## 2016-06-19 LAB — TSH: TSH: 2.119 u[IU]/mL (ref 0.350–4.500)

## 2016-06-19 NOTE — Patient Instructions (Signed)
Go to main entrance of hospital and ask to be taken to respiratory. Arrive 15 minutes prior to your appointment  Do not smoke or use inhalers 4 hours prior to test You can eat and drink as normal

## 2016-06-19 NOTE — Progress Notes (Signed)
Patient ID: Ann Fletcher, female   DOB: 28-Jul-1932, 83 y.o.   MRN: [id number]      Primary Care Physician: [name], MD Referring Physician: Melina Copa, PA-C Cardiologist: Dr. Eulah [name] is a 83 y.o. female with a h/o HTN, DM, HLD, lower extremity edema, hypothyroidism and recently diagnosed PAF/acute diastolic CHF who presents for f/u in the afib clinic. She has h/o right carotid bruit with prior carotid US showing mild plaque only bilaterally. She had nuclear stress test in December 2015 which was negative with EF 77%. She was admitted to [facility] 7/7-7/15 with AF RVR. She had recently been diagnosed by PCP the week prior to admission after presenting with SOB. She was placed on Toprol and Pradaxa. Unfortunately, despite good outpatient effort, patient's heart rate continued to be elevated. She was seen by cardiology service and was admitted for planned TEE/DCCV. TSH and troponins were normal. Echocardiogram showed EF 50-55%, mild MR, severe TR, moderately dilated left atrium. On exam, she appeared fluid overloaded and was aggressively diuresed with IV diuresis. Her diltiazem was changed to IV form to allow for easier titration which was eventually transition to 240mg  daily. She was continued on 100mg  daily Toprol XL. She underwent TEE cardioversion on 10/08/2014 and received 3 synchronous shocks which were unsuccessful in converting her. Post procedure, she was started on IV amiodarone with the intention of repeat DC cardioversion after loading. After 7 days of loading with amiodarone 400 mg twice a day, she underwent repeat DC cardioversion on 10/15/2014 under propofol. She was successfully converted from atrial fibrillation to atrial tachycardia with PACs after 3 attempts. It appears the last note during her hospitalization indicates she was back in atrial fib but with lower rate prior to discharge. Amiodarone was decreased to 200mg  BID with plans to reduce further to 200mg  daily  at 1 month (~11/15/14). Discharge weight 171lbs (diuresed >7L during her admission). Of note CBC showed Hgb 11.0, microcytic with MCV 77.5. No recent LFTs.  When seen  by [name], PA, 7/29, she was feeling well. She was still in afib, HR around 100. She denied any CP, SOB, palpitations, dizziness or syncope. Her LEE has been minimal. Baseline weight was stable.  She denied LEE, orthopnea, PND. She is following a low-salt diet.   When initially seen in afib clinic, 8/5, she was still  in afib, HR around 100. She denies any CP, SOB, palpitations, dizziness or syncope. Her LEE is minimal. Baseline weight is hovering around 168lbs which she says is where she has been since discharge. Bmet was repeated and showed increase in creat and bun. She was asked to reduce lasix to 1/2 tab of 80 mg lasix daily. She denies LEE, orthopnea, PND.  Denied any bleeding. I discussed with Dr. [name] and he suggested since she has been on amiodarone now x one month that it would be reasonable to try one more DCCV. If she does not stay in SR, then rate control with low dose amiodarone may be indicated.  She was scheduled for DCCV on 8/15 and did convert to SR. However, unfortunately,  did have ERAF. She stated that she had n/v on Thursday,denied blood in the vomitus and denies blood in stool. Now no further vomiting but states her stomach stays upset after taking pradaxa and her appetitive has not been as good. Will try switching her to eliquis and see if she tolerates better.Also reduced amiodarone  to 200 mg  Dr. [name] suggested that she  may need low dose amiodarone to help with rate control. She does not have complaints of palpitations, weight is stable, down 2 lbs from last visit.  She was seen by [name] 8/30 and found to be in a junctional rhythm. Amiodarone dose was decreased to 100 mg daily and cardizem dose was cut in half. She was is in sinus brady with first degree AV block. She feels well, no further stomach  complaints. Weight is stable.  She returns 10/18 feeling well. Weight is up per pt(2 lbs) due to appetitive being good. Ankles are swollen and we discussed that she could increase lasix to a full tablet(80) mg if needed x 3 days only then return to 1/2 pill a day. In SR, has not noticed any afib. She is being compliant with blood thinner.  F/u afib clinic 7/20 and has no complaints. She is staying in [place] is stable. No unusual shortness of breath. Being compliant  with eliquis, no bleeding issues. Metformin bid made her nauseated so she takes only once a day. Her husband has had Bypass surgery since she was seen here last and did well.  Pt called to office and asked for appointment for being tired and not having much stamina. She thought she may be in afib. EKG today shows SR with first degree av block. Pr int 272 ms, qrs int 96 ms, qtc 472 ms.  She states that she has to stit down and rest after she irons a little bit. Her husband states that she cant walk a half of a block before she has to stop due to being tired and short of breath. She has been told she is anemic in the past.  Today, she denies symptoms of palpitations, chest pain,  orthopnea, PND,  Positive for fatigue and shortness of breath.  No dizziness, presyncope, syncope, or neurologic sequela.  Past Medical History:  Diagnosis Date  . Anxiety   . Arthritis   . Chronic diastolic CHF (congestive heart failure) ([place])    a. Acute exacerbation occurred in setting of AF.  . Diabetes mellitus without complication ([place])    type 2  . H/O bladder infections   . Hyperlipidemia   . Hypertension   . Hypothyroidism   . Lower extremity edema   . Osteoporosis   . Persistent atrial fibrillation ([place])    a. diagnosed in 10/2014 by PCP, underwent failed TEE DCCV on 10/08/14, loaded with amio, successfully DCCV on 10/15/14.   Past Surgical History:  Procedure Laterality Date  . BACK SURGERY  2001, 2003, 2006   x 3  . CARDIOVERSION N/A  10/08/2014   Procedure: CARDIOVERSION;  Surgeon: [name], MD;  Location: [facility];  Service: Cardiovascular;  Laterality: N/A;  . CARDIOVERSION N/A 10/15/2014   Procedure: CARDIOVERSION;  Surgeon: [name], MD;  Location: [facility] ENDOSCOPY;  Service: Cardiovascular;  Laterality: N/A;  . CARDIOVERSION N/A 11/15/2014   Procedure: CARDIOVERSION;  Surgeon: [name], MD;  Location: [facility];  Service: Cardiovascular;  Laterality: N/A;  . 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 TEE WITHOUT CARDIOVERSION N/A 10/08/2014   Procedure: TRANSESOPHAGEAL ECHOCARDIOGRAM (TEE);  Surgeon: [name], MD;  Location: [facility];  Service: Cardiovascular;  Laterality: N/A;  . TUBAL LIGATION      Current Outpatient Prescriptions  Medication Sig Dispense Refill  . albuterol (PROVENTIL HFA;VENTOLIN HFA) 108 (90 BASE) MCG/ACT inhaler Inhale 2 puffs into the lungs 3 (three) times daily as needed for wheezing.    [name]  amiodarone (PACERONE) 200 MG tablet TAKE ONE-HALF TABLET DAILY 45 tablet 2  . atorvastatin (LIPITOR) 40 MG tablet Take 40 mg by mouth daily.    . Calcium Carbonate-Vitamin D (OSCAL 500/200 D-3 PO) Take 1 tablet by mouth 2 (two) times daily.    [name] diltiazem (CARDIZEM CD) 180 MG 24 hr capsule TAKE ONE CAPSULE EACH DAY 30 capsule 11  . ELIQUIS 5 MG TABS tablet TAKE ONE TABLET TWICE DAILY 60 tablet 6  . fluticasone (FLONASE) 50 MCG/ACT nasal spray Place 1 spray into both nostrils as needed.    . furosemide (LASIX) 80 MG tablet Take 1 tablet (80 mg total) by mouth daily. 60 tablet 11  . glimepiride (AMARYL) 4 MG tablet Take 4 mg by mouth daily with breakfast.     . levothyroxine (SYNTHROID, LEVOTHROID) 100 MCG tablet Take 1 tablet by mouth daily.    [name] LORazepam (ATIVAN) 1 MG tablet Take 1 mg by mouth at bedtime.     . metFORMIN (GLUCOPHAGE) 500 MG tablet Take 500 mg by mouth daily with breakfast.    . metoprolol succinate (TOPROL-XL) 100 MG 24 hr tablet Pt taking one tablet daily    .  potassium chloride SA (K-DUR,KLOR-CON) 20 MEQ tablet Take 1 tablet (20 mEq total) by mouth 2 (two) times daily. 60 tablet 4  . vitamin E 400 UNIT capsule Take 400 Units by mouth daily.     No current facility-administered medications for this encounter.     Allergies  Allergen Reactions  . Pradaxa [Dabigatran Etexilate Mesylate] Nausea And Vomiting  . Nsaids Other (See Comments)    Stomach cramps  . Sulfa Antibiotics Nausea And Vomiting and Other (See Comments)    GI upset  . Betadine [Povidone Iodine] Itching    Social History   Social History  . Marital status: Married    Spouse name: N/A  . Number of children: N/A  . Years of education: N/A   Occupational History  . Not on file.   Social History Main Topics  . Smoking status: Never Smoker  . Smokeless tobacco: Never Used  . Alcohol use No  . Drug use: No  . Sexual activity: Yes    Birth control/ protection: Surgical   Other Topics Concern  . Not on file   Social History Narrative  . No narrative on file    Family History  Problem Relation Age of Onset  . Pneumonia Mother   . Heart attack Father   . Diabetes Sister     ROS- All systems are reviewed and negative except as per the HPI above  Physical Exam: Vitals:   06/19/16 1048  BP: (!) 172/58  Pulse: 65  Weight: 164 lb 8 oz (74.6 kg)  Height: 5\' 2"  (1.575 m)    GEN- The patient is not in any distress, alert and oriented x 3 today.   Head- normocephalic, atraumatic Eyes-  Sclera clear, conjunctiva pink Ears- hearing intact Oropharynx- clear Neck- supple, no JVP Lymph- no cervical lymphadenopathy Lungs- Clear to ausculation bilaterally, normal work of breathing Heart- regular rate and rhythm, no murmurs, rubs or gallops, PMI not laterally displaced GI- soft, NT, ND, + BS Extremities- no clubbing, cyanosis, or edema MS- no significant deformity or atrophy Skin- no rash or lesion Psych- euthymic mood, full affect Neuro- strength and sensation  are intact  EKG-  Sinus rhythm at 65 bpm, first degree AVB, pr int 272 ms, qrs int 96 ms, qtc 472 ms Epic records reviewed  Assessment and Plan:  1. Persistent afib Continues in sinus with first degree block Continue amiodarone at 100 mg a day  Continue Cardizem 180 mg a day Metoprolol 100 mg  Cmet,TSH, CBC, hgb A1c today PFT's  2. GI distress/ indigestion,N/V, anorexia Resolved with change form pradaxa to eliquis  Also, h/o nausea with metformin bid and is better with once daily dosing  3. Chronic diastolic HF Weight stable Fluid status stable   F/u afib clinic in one month Consider if no reason for fatigue to reduce BB to 50 mg qd    Ann Fletcher, [facility] [address] [place], [place] [address] [phone]

## 2016-06-20 LAB — HEMOGLOBIN A1C
Hgb A1c MFr Bld: 7.6 % — ABNORMAL HIGH (ref 4.8–5.6)
Mean Plasma Glucose: 171 mg/dL

## 2016-06-29 ENCOUNTER — Ambulatory Visit (HOSPITAL_COMMUNITY)
Admission: RE | Admit: 2016-06-29 | Discharge: 2016-06-29 | Disposition: A | Discharge status: Home / Self Care | Payer: Medicare Other | Source: Ambulatory Visit | Attending: Nurse Practitioner | Admitting: Nurse Practitioner

## 2016-06-29 DIAGNOSIS — I480 Paroxysmal atrial fibrillation: Secondary | ICD-10-CM

## 2016-06-29 LAB — PULMONARY FUNCTION TEST
DL/VA % pred: 96 %
DL/VA: 4.4 ml/min/mmHg/L
DLCO cor % pred: 55 %
DLCO cor: 11.86 ml/min/mmHg
DLCO unc % pred: 49 %
DLCO unc: 10.6 ml/min/mmHg
FEF 25-75 Pre: 3.01 L/sec
FEF2575-%Pred-Pre: 250 %
FEV1-%Pred-Pre: 77 %
FEV1-Pre: 1.31 L
FEV1FVC-%Pred-Pre: 136 %
FEV6-%Pred-Pre: 60 %
FEV6-Pre: 1.31 L
FEV6FVC-%Pred-Pre: 106 %
FVC-%Pred-Pre: 57 %
FVC-Pre: 1.31 L
Pre FEV1/FVC ratio: 100 %
Pre FEV6/FVC Ratio: 100 %

## 2016-06-29 MED ORDER — ALBUTEROL SULFATE (2.5 MG/3ML) 0.083% IN NEBU
2.5000 mg | INHALATION_SOLUTION | Freq: Once | RESPIRATORY_TRACT | Status: AC
  Administered 2016-06-29: 2.5 mg via RESPIRATORY_TRACT

## 2016-06-29 NOTE — Telephone Encounter (Signed)
Closed encounter °

## 2016-07-18 ENCOUNTER — Encounter (HOSPITAL_COMMUNITY): Payer: Self-pay | Admitting: Nurse Practitioner

## 2016-07-18 ENCOUNTER — Ambulatory Visit (HOSPITAL_COMMUNITY)
Admission: RE | Admit: 2016-07-18 | Discharge: 2016-07-18 | Disposition: A | Discharge status: Home / Self Care | Payer: Medicare Other | Source: Ambulatory Visit | Attending: Nurse Practitioner | Admitting: Nurse Practitioner

## 2016-07-18 VITALS — BP 144/76 | HR 84 | Ht 62.0 in | Wt 167.8 lb

## 2016-07-18 DIAGNOSIS — E039 Hypothyroidism, unspecified: Secondary | ICD-10-CM | POA: Diagnosis not present

## 2016-07-18 DIAGNOSIS — I480 Paroxysmal atrial fibrillation: Secondary | ICD-10-CM | POA: Diagnosis not present

## 2016-07-18 DIAGNOSIS — E785 Hyperlipidemia, unspecified: Secondary | ICD-10-CM | POA: Diagnosis not present

## 2016-07-18 DIAGNOSIS — D649 Anemia, unspecified: Secondary | ICD-10-CM | POA: Diagnosis not present

## 2016-07-18 DIAGNOSIS — M1990 Unspecified osteoarthritis, unspecified site: Secondary | ICD-10-CM | POA: Insufficient documentation

## 2016-07-18 DIAGNOSIS — F419 Anxiety disorder, unspecified: Secondary | ICD-10-CM | POA: Insufficient documentation

## 2016-07-18 DIAGNOSIS — I5032 Chronic diastolic (congestive) heart failure: Secondary | ICD-10-CM | POA: Diagnosis not present

## 2016-07-18 DIAGNOSIS — M810 Age-related osteoporosis without current pathological fracture: Secondary | ICD-10-CM | POA: Diagnosis not present

## 2016-07-18 DIAGNOSIS — E119 Type 2 diabetes mellitus without complications: Secondary | ICD-10-CM | POA: Insufficient documentation

## 2016-07-18 DIAGNOSIS — R942 Abnormal results of pulmonary function studies: Secondary | ICD-10-CM

## 2016-07-18 DIAGNOSIS — I481 Persistent atrial fibrillation: Secondary | ICD-10-CM | POA: Diagnosis not present

## 2016-07-18 DIAGNOSIS — Z882 Allergy status to sulfonamides status: Secondary | ICD-10-CM | POA: Diagnosis not present

## 2016-07-18 DIAGNOSIS — Z7901 Long term (current) use of anticoagulants: Secondary | ICD-10-CM | POA: Insufficient documentation

## 2016-07-18 DIAGNOSIS — Z7984 Long term (current) use of oral hypoglycemic drugs: Secondary | ICD-10-CM | POA: Insufficient documentation

## 2016-07-18 DIAGNOSIS — Z8249 Family history of ischemic heart disease and other diseases of the circulatory system: Secondary | ICD-10-CM | POA: Diagnosis not present

## 2016-07-18 DIAGNOSIS — Z79899 Other long term (current) drug therapy: Secondary | ICD-10-CM | POA: Insufficient documentation

## 2016-07-18 DIAGNOSIS — I110 Hypertensive heart disease with heart failure: Secondary | ICD-10-CM | POA: Insufficient documentation

## 2016-07-18 DIAGNOSIS — Z833 Family history of diabetes mellitus: Secondary | ICD-10-CM | POA: Diagnosis not present

## 2016-07-18 DIAGNOSIS — Z888 Allergy status to other drugs, medicaments and biological substances status: Secondary | ICD-10-CM | POA: Insufficient documentation

## 2016-07-18 NOTE — Progress Notes (Signed)
Patient ID: Ann Fletcher, female   DOB: 1932/08/04, 83 y.o.   MRN: [id number]      Primary Care Physician: [name], MD Referring Physician: Melina Copa, PA-C Cardiologist: Dr. Eulah [name] is a 83 y.o. female with a h/o HTN, DM, HLD, lower extremity edema, hypothyroidism and recently diagnosed PAF/acute diastolic CHF who presents for f/u in the afib clinic. She has h/o right carotid bruit with prior carotid US showing mild plaque only bilaterally. She had nuclear stress test in December 2015 which was negative with EF 77%. She was admitted [facility]15 with AF RVR. She had recently been diagnosed by PCP the week prior to admission after presenting with SOB. She was placed on Toprol and Pradaxa. Unfortunately, despite good outpatient effort, patient's heart rate continued to be elevated. She was seen by cardiology service and was admitted for planned TEE/DCCV. TSH and troponins were normal. Echocardiogram showed EF 50-55%, mild MR, severe TR, moderately dilated left atrium. On exam, she appeared fluid overloaded and was aggressively diuresed with IV diuresis. Her diltiazem was changed to IV form to allow for easier titration which was eventually transition to 240mg  daily. She was continued on 100mg  daily Toprol XL. She underwent TEE cardioversion on 10/08/2014 and received 3 synchronous shocks which were unsuccessful in converting her. Post procedure, she was started on IV amiodarone with the intention of repeat DC cardioversion after loading. After 7 days of loading with amiodarone 400 mg twice a day, she underwent repeat DC cardioversion on 10/15/2014 under propofol. She was successfully converted from atrial fibrillation to atrial tachycardia with PACs after 3 attempts. It appears the last note during her hospitalization indicates she was back in atrial fib but with lower rate prior to discharge. Amiodarone was decreased to 200mg  BID with plans to reduce further to 200mg  daily  at 1 month (~11/15/14). Discharge weight 171lbs (diuresed >7L during her admission). Of note CBC showed Hgb 11.0, microcytic with MCV 77.5. No recent LFTs.  When seen  by [name], PA, 7/29, she was feeling well. She was still in afib, HR around 100. She denied any CP, SOB, palpitations, dizziness or syncope. Her LEE has been minimal. Baseline weight was stable.  She denied LEE, orthopnea, PND. She is following a low-salt diet.   When initially seen in afib clinic, 8/5, she was still  in afib, HR around 100. She denies any CP, SOB, palpitations, dizziness or syncope. Her LEE is minimal. Baseline weight is hovering around 168lbs which she says is where she has been since discharge. Bmet was repeated and showed increase in creat and bun. She was asked to reduce lasix to 1/2 tab of 80 mg lasix daily. She denies LEE, orthopnea, PND.  Denied any bleeding. I discussed with Dr. [name] and he suggested since she has been on amiodarone now x one month that it would be reasonable to try one more DCCV. If she does not stay in SR, then rate control with low dose amiodarone may be indicated.  She was scheduled for DCCV on 8/15 and did convert to SR. However, unfortunately,  did have ERAF. She stated that she had n/v on Thursday,denied blood in the vomitus and denies blood in stool. Now no further vomiting but states her stomach stays upset after taking pradaxa and her appetitive has not been as good. Will try switching her to eliquis and see if she tolerates better.Also reduced amiodarone  to 200 mg  Dr. [name] suggested that she  may need low dose amiodarone to help with rate control. She does not have complaints of palpitations, weight is stable, down 2 lbs from last visit.  She was seen by [name] 8/30 and found to be in a junctional rhythm. Amiodarone dose was decreased to 100 mg daily and cardizem dose was cut in half. She was is in sinus brady with first degree AV block. She feels well, no further stomach  complaints. Weight is stable.  She returns 10/18 feeling well. Weight is up per pt(2 lbs) due to appetitive being good. Ankles are swollen and we discussed that she could increase lasix to a full tablet(80) mg if needed x 3 days only then return to 1/2 pill a day. In SR, has not noticed any afib. She is being compliant with blood thinner.  F/u afib clinic 7/20 and has no complaints. She is staying in [place] is stable. No unusual shortness of breath. Being compliant  with eliquis, no bleeding issues. Metformin bid made her nauseated so she takes only once a day. Her husband has had Bypass surgery since she was seen here last and did well.  Pt called to office and asked for appointment for being tired and not having much stamina. She thought she may be in afib. EKG today shows SR with first degree av block. Pr int 272 ms, qrs int 96 ms, qtc 472 ms.  She states that she has to stit down and rest after she irons a little bit. Her husband states that she cant walk a half of a block before she has to stop due to being tired and short of breath. She has been told she is anemic in the past.  F/u in the afib clinc 4/18. On last visit labs were drawn and showed mild increase in her anemia. She denies any visible blood in stool She is c/o fatigue and shortness of breath. Her results of PFT's showed possible interstitial disease and she is asked today to see Pulmonology for evaluation of results and if she can continue on amiodarone. She has been staying in [place].  Today, she denies symptoms of palpitations, chest pain,  orthopnea, PND,  Positive for fatigue and shortness of breath.  No dizziness, presyncope, syncope, or neurologic sequela.  Past Medical History:  Diagnosis Date  . Anxiety   . Arthritis   . Chronic diastolic CHF (congestive heart failure) ([place])    a. Acute exacerbation occurred in setting of AF.  . Diabetes mellitus without complication ([place])    type 2  . H/O bladder infections   .  Hyperlipidemia   . Hypertension   . Hypothyroidism   . Lower extremity edema   . Osteoporosis   . Persistent atrial fibrillation ([place])    a. diagnosed in 10/2014 by PCP, underwent failed TEE DCCV on 10/08/14, loaded with amio, successfully DCCV on 10/15/14.   Past Surgical History:  Procedure Laterality Date  . BACK SURGERY  2001, 2003, 2006   x 3  . CARDIOVERSION N/A 10/08/2014   Procedure: CARDIOVERSION;  Surgeon: [name], MD;  Location: [facility];  Service: Cardiovascular;  Laterality: N/A;  . CARDIOVERSION N/A 10/15/2014   Procedure: CARDIOVERSION;  Surgeon: [name], MD;  Location: [facility] ENDOSCOPY;  Service: Cardiovascular;  Laterality: N/A;  . CARDIOVERSION N/A 11/15/2014   Procedure: CARDIOVERSION;  Surgeon: [name], MD;  Location: [facility];  Service: Cardiovascular;  Laterality: N/A;  . 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 TEE WITHOUT CARDIOVERSION  N/A 10/08/2014   Procedure: TRANSESOPHAGEAL ECHOCARDIOGRAM (TEE);  Surgeon: [name], MD;  Location: [facility];  Service: Cardiovascular;  Laterality: N/A;  . TUBAL LIGATION      Current Outpatient Prescriptions  Medication Sig Dispense Refill  . albuterol (PROVENTIL HFA;VENTOLIN HFA) 108 (90 BASE) MCG/ACT inhaler Inhale 2 puffs into the lungs 3 (three) times daily as needed for wheezing.    [name] amiodarone (PACERONE) 200 MG tablet TAKE ONE-HALF TABLET DAILY 45 tablet 2  . atorvastatin (LIPITOR) 40 MG tablet Take 40 mg by mouth daily.    . Calcium Carbonate-Vitamin D (OSCAL 500/200 D-3 PO) Take 1 tablet by mouth 2 (two) times daily.    [name] diltiazem (CARDIZEM CD) 180 MG 24 hr capsule TAKE ONE CAPSULE EACH DAY 30 capsule 11  . ELIQUIS 5 MG TABS tablet TAKE ONE TABLET TWICE DAILY 60 tablet 6  . fluticasone (FLONASE) 50 MCG/ACT nasal spray Place 1 spray into both nostrils as needed.    . furosemide (LASIX) 80 MG tablet Take 1 tablet (80 mg total) by mouth daily. 60 tablet 11  . glimepiride (AMARYL) 4 MG tablet  Take 4 mg by mouth daily with breakfast.     . levothyroxine (SYNTHROID, LEVOTHROID) 100 MCG tablet Take 1 tablet by mouth daily.    [name] LORazepam (ATIVAN) 1 MG tablet Take 1 mg by mouth at bedtime.     . metFORMIN (GLUCOPHAGE) 500 MG tablet Take 500 mg by mouth daily with breakfast.    . metoprolol succinate (TOPROL-XL) 100 MG 24 hr tablet Pt taking one tablet daily    . potassium chloride SA (K-DUR,KLOR-CON) 20 MEQ tablet Take 1 tablet (20 mEq total) by mouth 2 (two) times daily. 60 tablet 4  . vitamin E 400 UNIT capsule Take 400 Units by mouth daily.     No current facility-administered medications for this encounter.     Allergies  Allergen Reactions  . Pradaxa [Dabigatran Etexilate Mesylate] Nausea And Vomiting  . Nsaids Other (See Comments)    Stomach cramps  . Sulfa Antibiotics Nausea And Vomiting and Other (See Comments)    GI upset  . Betadine [Povidone Iodine] Itching    Social History   Social History  . Marital status: Married    Spouse name: N/A  . Number of children: N/A  . Years of education: N/A   Occupational History  . Not on file.   Social History Main Topics  . Smoking status: Never Smoker  . Smokeless tobacco: Never Used  . Alcohol use No  . Drug use: No  . Sexual activity: Yes    Birth control/ protection: Surgical   Other Topics Concern  . Not on file   Social History Narrative  . No narrative on file    Family History  Problem Relation Age of Onset  . Pneumonia Mother   . Heart attack Father   . Diabetes Sister     ROS- All systems are reviewed and negative except as per the HPI above  Physical Exam: Vitals:   07/18/16 1033  BP: (!) 144/76  Pulse: 84  Weight: 167 lb 12.8 oz (76.1 kg)  Height: 5\' 2"  (1.575 m)    GEN- The patient is not in any distress, alert and oriented x 3 today.   Head- normocephalic, atraumatic Eyes-  Sclera clear, conjunctiva pink Ears- hearing intact Oropharynx- clear Neck- supple, no JVP Lymph- no  cervical lymphadenopathy Lungs- Clear to ausculation bilaterally, normal work of breathing Heart- regular rate and  rhythm, no murmurs, rubs or gallops, PMI not laterally displaced GI- soft, NT, ND, + BS Extremities- no clubbing, cyanosis, or edema MS- no significant deformity or atrophy Skin- no rash or lesion Psych- euthymic mood, full affect Neuro- strength and sensation are intact  EKG-  Sinus rhythm at 84 bpm, , pr int 12ms, qrs int 100 ms, qtc 449 ms Epic records reviewed PFT's-Conclusions: The diffusion defect, increased FEV1/FVC ratio and reduced FVC suggest an early interstitial process. In view of the severity of the diffusion defect, studies with exercise would be helpful to evaluate the presence of hypoxemia.  Assessment and Plan:  1. Persistent afib Continues in sinus  Continue amiodarone at 100 mg a day  Continue Cardizem 180 mg a day Metoprolol 100 mg  Pending consult with Pulmonology 2/2  Abnormal PFT's  2. H/o GI distress/ indigestion,N/V, anorexia Resolved with change form pradaxa to eliquis  Anemia, recent cbc showed increase in anemia,pt is pending labs with pcp tomorrow, sent most recent CBC to PCP for his review She states that she thinks it is time for a repeat colonoscopy, asked her to bring this up with pcp,when she sees him 4/25 she has had polyps in the past  3. Chronic diastolic HF Weight stable Fluid status stable   F/u afib clinic in 3 months     Butch Penny C. Allanna Bresee, [facility] [address] [place],  [address] [phone]

## 2016-07-19 DIAGNOSIS — E782 Mixed hyperlipidemia: Secondary | ICD-10-CM | POA: Diagnosis not present

## 2016-07-19 DIAGNOSIS — E118 Type 2 diabetes mellitus with unspecified complications: Secondary | ICD-10-CM | POA: Diagnosis not present

## 2016-07-25 DIAGNOSIS — E1165 Type 2 diabetes mellitus with hyperglycemia: Secondary | ICD-10-CM | POA: Diagnosis not present

## 2016-07-25 DIAGNOSIS — I129 Hypertensive chronic kidney disease with stage 1 through stage 4 chronic kidney disease, or unspecified chronic kidney disease: Secondary | ICD-10-CM | POA: Diagnosis not present

## 2016-07-25 DIAGNOSIS — E1121 Type 2 diabetes mellitus with diabetic nephropathy: Secondary | ICD-10-CM | POA: Diagnosis not present

## 2016-07-25 DIAGNOSIS — E782 Mixed hyperlipidemia: Secondary | ICD-10-CM | POA: Diagnosis not present

## 2016-07-31 ENCOUNTER — Ambulatory Visit (INDEPENDENT_AMBULATORY_CARE_PROVIDER_SITE_OTHER): Payer: Medicare Other | Admitting: Pulmonary Disease

## 2016-07-31 ENCOUNTER — Encounter: Payer: Self-pay | Admitting: Pulmonary Disease

## 2016-07-31 VITALS — BP 98/58 | HR 99 | Ht 63.0 in | Wt 165.0 lb

## 2016-07-31 DIAGNOSIS — R0689 Other abnormalities of breathing: Secondary | ICD-10-CM | POA: Diagnosis not present

## 2016-07-31 DIAGNOSIS — J849 Interstitial pulmonary disease, unspecified: Secondary | ICD-10-CM

## 2016-07-31 DIAGNOSIS — R0600 Dyspnea, unspecified: Secondary | ICD-10-CM

## 2016-07-31 NOTE — Progress Notes (Signed)
Ann Fletcher    [id number]    [birth year]  Primary Care Physician:RAMACHANDRAN,AJITH, MD  Referring Physician: [name], MD [address] [place], [place] [address]  Chief complaint:  Consult for abnormal pulmonary function tests   History of paroxysmal atrial fibrillation on amiodarone  HPI: Ann Fletcher is a 83 year old with past medical history of paroxysmal atrial fibrillation first diagnosed in 2016 she underwent cardioversion which was unsuccessful. She was subsequently placed on amiodarone and a repeat cardioversion. She has been maintained on amiodarone since 2016. Recent PFTs was concerning for restriction and TLC impairment and she has been referred here for further evaluation. She has history of DVT in 2010 and was treated with Coumadin. She has been off anticoagulation until it was resumed in the setting of atrial fibrillation. She did not tolerate Pradaxa and is currently on Eliquis.  She complains of dyspnea on exertion, occasional wheeze. Denies any cough, sputum production. She is a lifelong nonsmoker with no known exposures.  Pets: None Occupation: Housewife Exposures: No known exposures Smoking history: Never smoker  Outpatient Encounter Prescriptions as of 07/31/2016  Medication Sig  . albuterol (PROVENTIL HFA;VENTOLIN HFA) 108 (90 BASE) MCG/ACT inhaler Inhale 2 puffs into the lungs 3 (three) times daily as needed for wheezing.  [name] amiodarone (PACERONE) 200 MG tablet TAKE ONE-HALF TABLET DAILY  . atorvastatin (LIPITOR) 40 MG tablet Take 40 mg by mouth daily.  . Calcium Carbonate-Vitamin D (OSCAL 500/200 D-3 PO) Take 1 tablet by mouth 2 (two) times daily.  [name] diltiazem (CARDIZEM CD) 180 MG 24 hr capsule TAKE ONE CAPSULE EACH DAY  . ELIQUIS 5 MG TABS tablet TAKE ONE TABLET TWICE DAILY  . fluticasone (FLONASE) 50 MCG/ACT nasal spray Place 1 spray into both nostrils as needed.  . furosemide (LASIX) 80 MG tablet Take 1 tablet (80 mg total)  by mouth daily.  [name] glimepiride (AMARYL) 4 MG tablet Take 4 mg by mouth daily with breakfast.   . levothyroxine (SYNTHROID, LEVOTHROID) 100 MCG tablet Take 1 tablet by mouth daily.  [name] LORazepam (ATIVAN) 1 MG tablet Take 1 mg by mouth at bedtime.   . metFORMIN (GLUCOPHAGE) 500 MG tablet Take 500 mg by mouth daily with breakfast.  . metoprolol succinate (TOPROL-XL) 100 MG 24 hr tablet Pt taking one tablet daily  . potassium chloride SA (K-DUR,KLOR-CON) 20 MEQ tablet Take 1 tablet (20 mEq total) by mouth 2 (two) times daily.  . vitamin E 400 UNIT capsule Take 400 Units by mouth daily.   No facility-administered encounter medications on file as of 07/31/2016.     Allergies as of 07/31/2016 - Review Complete 07/31/2016  Allergen Reaction Noted  . Pradaxa [dabigatran etexilate mesylate] Nausea And Vomiting 03/24/2015  . Nsaids Other (See Comments) 12/13/2012  . Sulfa antibiotics Nausea And Vomiting and Other (See Comments) 10/29/2014  . Betadine [povidone iodine] Itching 02/17/2014    Past Medical History:  Diagnosis Date  . Anxiety   . Arthritis   . Chronic diastolic CHF (congestive heart failure) ([place])    a. Acute exacerbation occurred in setting of AF.  . Diabetes mellitus without complication ([place])    type 2  . H/O bladder infections   . Hyperlipidemia   . Hypertension   . Hypothyroidism   . Lower extremity edema   . Osteoporosis   . Persistent atrial fibrillation ([place])    a. diagnosed in 10/2014 by PCP, underwent failed TEE DCCV on 10/08/14, loaded with amio,  successfully DCCV on 10/15/14.    Past Surgical History:  Procedure Laterality Date  . BACK SURGERY  2001, 2003, 2006   x 3  . CARDIOVERSION N/A 10/08/2014   Procedure: CARDIOVERSION;  Surgeon: [name], MD;  Location: [facility];  Service: Cardiovascular;  Laterality: N/A;  . CARDIOVERSION N/A 10/15/2014   Procedure: CARDIOVERSION;  Surgeon: [name], MD;  Location: [facility] ENDOSCOPY;  Service: Cardiovascular;   Laterality: N/A;  . CARDIOVERSION N/A 11/15/2014   Procedure: CARDIOVERSION;  Surgeon: [name], MD;  Location: [facility];  Service: Cardiovascular;  Laterality: N/A;  . 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 TEE WITHOUT CARDIOVERSION N/A 10/08/2014   Procedure: TRANSESOPHAGEAL ECHOCARDIOGRAM (TEE);  Surgeon: [name], MD;  Location: [facility] ENDOSCOPY;  Service: Cardiovascular;  Laterality: N/A;  . TUBAL LIGATION      Family History  Problem Relation Age of Onset  . Pneumonia Mother   . Heart attack Father   . Diabetes Sister     Social History   Social History  . Marital status: Married    Spouse name: N/A  . Number of children: N/A  . Years of education: N/A   Occupational History  . Not on file.   Social History Main Topics  . Smoking status: Never Smoker  . Smokeless tobacco: Never Used  . Alcohol use No  . Drug use: No  . Sexual activity: Yes    Birth control/ protection: Surgical   Other Topics Concern  . Not on file   Social History Narrative  . No narrative on file    Review of systems: Review of Systems  Constitutional: Negative for fever and chills.  HENT: Negative.   Eyes: Negative for blurred vision.  Respiratory: as per HPI  Cardiovascular: Negative for chest pain and palpitations.  Gastrointestinal: Negative for vomiting, diarrhea, blood per rectum. Genitourinary: Negative for dysuria, urgency, frequency and hematuria.  Musculoskeletal: Negative for myalgias, back pain and joint pain.  Skin: Negative for itching and rash.  Neurological: Negative for dizziness, tremors, focal weakness, seizures and loss of consciousness.  Endo/Heme/Allergies: Negative for environmental allergies.  Psychiatric/Behavioral: Negative for depression, suicidal ideas and hallucinations.  All other systems reviewed and are negative.  Physical Exam: Blood pressure (!) 98/58, pulse 99, height 5\' 3"  (1.6 m), weight 165 lb (74.8 kg), SpO2 95 %. Gen:      No acute  distress HEENT:  EOMI, sclera anicteric Neck:     No masses; no thyromegaly Lungs:    Clear to auscultation bilaterally; normal respiratory effort CV:         Regular rate and rhythm; no murmurs Abd:      + bowel sounds; soft, non-tender; no palpable masses, no distension Ext:    No edema; adequate peripheral perfusion Skin:      Warm and dry; no rash Neuro: alert and oriented x 3 Psych: normal mood and affect  Data Reviewed: CXR 10/14/14- cardiomegaly, pulmonary vascular congestion Chest x-ray 10/07/14-cardiomegaly, pulmonary vascular congestion CT scan chest 07/24/08-no pulmonary embolism, mosaic attenuation I have reviewed all images personally  Echocardiogram 10/07/14 - Left ventricle: The cavity size was normal. Wall thickness was   normal. Systolic function was normal. The estimated ejection   fraction was in the range of 50% to 55%. - Mitral valve: There was mild regurgitation. - Left atrium: The atrium was moderately dilated. - Right ventricle: The cavity size was moderately dilated. - Right atrium: The atrium was severely dilated. - Tricuspid valve:  There was severe regurgitation. - Pericardium, extracardiac: Small posterior pericardial effusion  LE Dopplers 09/07/08-right popliteal DVT  PFTs 06/29/16 FVC 2.29 (57%) FEV1 1.69 (77%) F/F 73 SVC 58% DLCO 49% Moderate restriction with diffusion impairment. No obstruction This does not meet ATS standards for to be repeatability.  Assessment:  Evaluation for abnormal PFTs Review of her PFTs show reduction in slow vital capacity and diffusion. She was unable to complete plethysmography. Tests do not meet ATS criteria for dependability and reproducibility as she cannot follow directions, so there conclusion of restriction and diffusion impairment are not definite. I'll get a CT scan of the chest for further evaluation of possible amiodarone toxicity.  Plan/Recommendations: - High res CT of chest.  Marshell Garfinkel MD [facility]  Pulmonary and Critical Care Pager [phone] 07/31/2016, 11:17 AM  CC: [name], MD

## 2016-07-31 NOTE — Patient Instructions (Signed)
We will schedule you for a high-resolution CT of the chest to evaluate for interstitial lung disease, amiodarone toxicity. Follow-up in 3 months.

## 2016-07-31 NOTE — Addendum Note (Signed)
Addended by: Maryanna Shape A on: 07/31/2016 11:57 AM   Modules accepted: Orders

## 2016-08-03 ENCOUNTER — Ambulatory Visit (INDEPENDENT_AMBULATORY_CARE_PROVIDER_SITE_OTHER): Payer: Medicare Other | Admitting: Podiatry

## 2016-08-03 ENCOUNTER — Other Ambulatory Visit (HOSPITAL_COMMUNITY): Payer: Self-pay | Admitting: Nurse Practitioner

## 2016-08-03 DIAGNOSIS — B351 Tinea unguium: Secondary | ICD-10-CM

## 2016-08-03 DIAGNOSIS — M79676 Pain in unspecified toe(s): Secondary | ICD-10-CM | POA: Diagnosis not present

## 2016-08-03 NOTE — Progress Notes (Signed)
Complaint:  Visit Type: Patient returns to my office for continued preventative foot care services. Complaint: Patient states" my nails have grown long and thick and become painful to walk and wear shoes" Patient has been diagnosed with DM with no foot complications. The patient presents for preventative foot care services. No changes to ROS  Podiatric Exam: Vascular: dorsalis pedis and posterior tibial pulses are palpable bilateral. Capillary return is immediate. Temperature gradient is WNL. Skin turgor WNL  Sensorium: Normal Semmes Weinstein monofilament test. Normal tactile sensation bilaterally. Nail Exam: Pt has thick disfigured discolored nails with subungual debris noted bilateral all nails except hallux toes  B/L Ulcer Exam: There is no evidence of ulcer or pre-ulcerative changes or infection. Orthopedic Exam: Muscle tone and strength are WNL. No limitations in general ROM. No crepitus or effusions noted. HAV  B/L.  Hammer toes second left Skin: No Porokeratosis. No infection or ulcers  Diagnosis:  Onychomycosis, , Pain in right toe, pain in left toes  Treatment & Plan Procedures and Treatment: Consent by patient was obtained for treatment procedures. The patient understood the discussion of treatment and procedures well. All questions were answered thoroughly reviewed. Debridement of mycotic and hypertrophic toenails, 1 through 5 bilateral and clearing of subungual debris. No ulceration, no infection noted.  Return Visit-Office Procedure: Patient instructed to return to the office for a follow up visit 3 months for continued evaluation and treatment.    Makailyn Mccormick DPM 

## 2016-08-10 ENCOUNTER — Ambulatory Visit (INDEPENDENT_AMBULATORY_CARE_PROVIDER_SITE_OTHER)
Admission: RE | Admit: 2016-08-10 | Discharge: 2016-08-10 | Disposition: A | Discharge status: Home / Self Care | Payer: Medicare Other | Source: Ambulatory Visit | Attending: Pulmonary Disease | Admitting: Pulmonary Disease

## 2016-08-10 DIAGNOSIS — J849 Interstitial pulmonary disease, unspecified: Secondary | ICD-10-CM

## 2016-08-10 DIAGNOSIS — R0602 Shortness of breath: Secondary | ICD-10-CM | POA: Diagnosis not present

## 2016-08-14 ENCOUNTER — Other Ambulatory Visit: Payer: Self-pay

## 2016-08-14 DIAGNOSIS — J849 Interstitial pulmonary disease, unspecified: Secondary | ICD-10-CM

## 2016-08-15 ENCOUNTER — Other Ambulatory Visit (INDEPENDENT_AMBULATORY_CARE_PROVIDER_SITE_OTHER): Payer: Medicare Other

## 2016-08-15 DIAGNOSIS — J849 Interstitial pulmonary disease, unspecified: Secondary | ICD-10-CM

## 2016-08-15 LAB — C-REACTIVE PROTEIN: CRP: 0.3 mg/dL — ABNORMAL LOW (ref 0.5–20.0)

## 2016-08-15 LAB — SEDIMENTATION RATE: Sed Rate: 78 mm/hr — ABNORMAL HIGH (ref 0–30)

## 2016-08-16 LAB — ANCA SCREEN W REFLEX TITER: ANCA Screen: POSITIVE — AB

## 2016-08-16 LAB — JO-1 ANTIBODY-IGG: Jo-1 Antibody, IgG: <1

## 2016-08-16 LAB — RHEUMATOID FACTOR: Rhuematoid fact SerPl-aCnc: <14 IU/mL (ref <14)

## 2016-08-16 LAB — CK TOTAL AND CKMB (NOT AT ARMC)
CK, MB: 4.6 ng/mL (ref 0.0–5.0)
Relative Index: 3 (ref 0.0–4.0)
Total CK: 155 U/L — ABNORMAL HIGH (ref 29–143)

## 2016-08-16 LAB — SJOGREN'S SYNDROME ANTIBODS(SSA + SSB)
SSA (Ro) (ENA) Antibody, IgG: <1
SSB (La) (ENA) Antibody, IgG: <1

## 2016-08-16 LAB — ANTI-SCLERODERMA ANTIBODY: Scleroderma (Scl-70) (ENA) Antibody, IgG: <1

## 2016-08-16 LAB — CYCLIC CITRUL PEPTIDE ANTIBODY, IGG: Cyclic Citrullin Peptide Ab: <16 Units

## 2016-08-16 LAB — CENTROMERE ANTIBODIES: Centromere Ab Screen: <1

## 2016-08-16 LAB — RFLX P-ANCA TITER: P-ANCA: 1:80 {titer} — ABNORMAL HIGH

## 2016-08-16 LAB — ANTI-SMITH ANTIBODY: ENA SM Ab Ser-aCnc: <1

## 2016-08-16 LAB — RNP ANTIBODY: Ribonucleic Protein(ENA) Antibody, IgG: <1

## 2016-08-16 LAB — ANTI-DNA ANTIBODY, DOUBLE-STRANDED: ds DNA Ab: <1 IU/mL

## 2016-08-17 LAB — ALDOLASE: Aldolase: 4.8 U/L (ref <=8.1)

## 2016-08-20 ENCOUNTER — Other Ambulatory Visit: Payer: Self-pay

## 2016-08-20 NOTE — Patient Outreach (Signed)
[facility]) Care Management  08/20/2016  DHARMA PARE 1933/02/20 [id number]   Medication Adherence to [name] call Mrs. Teston because she is due on her medication atorvastatin 40 mg & metformin 500 mg we call patient and pt ask to call pharmacy to fill her prescription we call pharmacy they said they had already deliver her medication on Friday 08/17/16 .    [facility] Direct Dial [phone]  Fax [phone] [email or web address]

## 2016-08-23 LAB — ANA COMPREHENSIVE PANEL
Anti JO-1: <0.2 AI (ref 0.0–0.9)
Centromere Ab Screen: <0.2 AI (ref 0.0–0.9)
Chromatin Ab SerPl-aCnc: <0.2 AI (ref 0.0–0.9)
ENA RNP Ab: <0.2 AI (ref 0.0–0.9)
ENA SM Ab Ser-aCnc: <0.2 AI (ref 0.0–0.9)
ENA SSA (RO) Ab: <0.2 AI (ref 0.0–0.9)
ENA SSB (LA) Ab: <0.2 AI (ref 0.0–0.9)
Scleroderma SCL-70: <0.2 AI (ref 0.0–0.9)
dsDNA Ab: 1 IU/mL (ref 0–9)

## 2016-08-23 LAB — HYPERSENSITIVITY PNEUMONITIS
A. Pullulans Abs: NEGATIVE
A.Fumigatus #1 Abs: NEGATIVE
Micropolyspora faeni, IgG: NEGATIVE
Pigeon Serum Abs: NEGATIVE
Thermoact. Saccharii: NEGATIVE
Thermoactinomyces vulgaris, IgG: NEGATIVE

## 2016-08-23 LAB — MYOSITIS PANEL III
EJ*: NEGATIVE
Jo-1 (WB)*: NEGATIVE
Ku*: NEGATIVE
Mi-2 antibodies*: NEGATIVE
OJ*: NEGATIVE
PL-12*: NEGATIVE
PL-7*: NEGATIVE
PM-Scl 100*: NEGATIVE
PM-Scl 75*: NEGATIVE
RNP: 26.4 EU/ml — AB
RO-52*: NEGATIVE
Signal Recognition Particle*: NEGATIVE

## 2016-08-23 LAB — ANA W/REFLEX: Anti Nuclear Antibody(ANA): NEGATIVE

## 2016-08-29 DIAGNOSIS — Z1231 Encounter for screening mammogram for malignant neoplasm of breast: Secondary | ICD-10-CM | POA: Diagnosis not present

## 2016-09-10 ENCOUNTER — Emergency Department (HOSPITAL_COMMUNITY): Payer: Medicare Other

## 2016-09-10 ENCOUNTER — Encounter (HOSPITAL_COMMUNITY): Payer: Self-pay

## 2016-09-10 ENCOUNTER — Emergency Department (HOSPITAL_COMMUNITY)
Admission: EM | Admit: 2016-09-10 | Discharge: 2016-09-10 | Disposition: A | Discharge status: Home / Self Care | Payer: Medicare Other | Attending: Emergency Medicine | Admitting: Emergency Medicine

## 2016-09-10 DIAGNOSIS — Y92480 Sidewalk as the place of occurrence of the external cause: Secondary | ICD-10-CM | POA: Insufficient documentation

## 2016-09-10 DIAGNOSIS — Z23 Encounter for immunization: Secondary | ICD-10-CM | POA: Diagnosis not present

## 2016-09-10 DIAGNOSIS — Z79899 Other long term (current) drug therapy: Secondary | ICD-10-CM | POA: Insufficient documentation

## 2016-09-10 DIAGNOSIS — Y999 Unspecified external cause status: Secondary | ICD-10-CM | POA: Diagnosis not present

## 2016-09-10 DIAGNOSIS — S91111A Laceration without foreign body of right great toe without damage to nail, initial encounter: Secondary | ICD-10-CM | POA: Diagnosis not present

## 2016-09-10 DIAGNOSIS — Y9389 Activity, other specified: Secondary | ICD-10-CM | POA: Diagnosis not present

## 2016-09-10 DIAGNOSIS — I110 Hypertensive heart disease with heart failure: Secondary | ICD-10-CM | POA: Diagnosis not present

## 2016-09-10 DIAGNOSIS — W19XXXA Unspecified fall, initial encounter: Secondary | ICD-10-CM

## 2016-09-10 DIAGNOSIS — Z7984 Long term (current) use of oral hypoglycemic drugs: Secondary | ICD-10-CM | POA: Diagnosis not present

## 2016-09-10 DIAGNOSIS — S82035A Nondisplaced transverse fracture of left patella, initial encounter for closed fracture: Secondary | ICD-10-CM

## 2016-09-10 DIAGNOSIS — M25562 Pain in left knee: Secondary | ICD-10-CM | POA: Diagnosis not present

## 2016-09-10 DIAGNOSIS — E1129 Type 2 diabetes mellitus with other diabetic kidney complication: Secondary | ICD-10-CM | POA: Insufficient documentation

## 2016-09-10 DIAGNOSIS — Z7901 Long term (current) use of anticoagulants: Secondary | ICD-10-CM | POA: Diagnosis not present

## 2016-09-10 DIAGNOSIS — S99921A Unspecified injury of right foot, initial encounter: Secondary | ICD-10-CM | POA: Diagnosis not present

## 2016-09-10 DIAGNOSIS — E039 Hypothyroidism, unspecified: Secondary | ICD-10-CM | POA: Insufficient documentation

## 2016-09-10 DIAGNOSIS — M79676 Pain in unspecified toe(s): Secondary | ICD-10-CM | POA: Diagnosis not present

## 2016-09-10 DIAGNOSIS — M79671 Pain in right foot: Secondary | ICD-10-CM | POA: Diagnosis not present

## 2016-09-10 DIAGNOSIS — Z96651 Presence of right artificial knee joint: Secondary | ICD-10-CM | POA: Insufficient documentation

## 2016-09-10 DIAGNOSIS — W0110XA Fall on same level from slipping, tripping and stumbling with subsequent striking against unspecified object, initial encounter: Secondary | ICD-10-CM | POA: Insufficient documentation

## 2016-09-10 DIAGNOSIS — T148XXA Other injury of unspecified body region, initial encounter: Secondary | ICD-10-CM | POA: Diagnosis not present

## 2016-09-10 DIAGNOSIS — S8992XA Unspecified injury of left lower leg, initial encounter: Secondary | ICD-10-CM | POA: Diagnosis not present

## 2016-09-10 DIAGNOSIS — I5032 Chronic diastolic (congestive) heart failure: Secondary | ICD-10-CM | POA: Insufficient documentation

## 2016-09-10 MED ORDER — LIDOCAINE HCL 2 % IJ SOLN
10.0000 mL | Freq: Once | INTRAMUSCULAR | Status: AC
  Administered 2016-09-10: 200 mg via INTRADERMAL
  Filled 2016-09-10: qty 20

## 2016-09-10 MED ORDER — TETANUS-DIPHTH-ACELL PERTUSSIS 5-2.5-18.5 LF-MCG/0.5 IM SUSP
0.5000 mL | Freq: Once | INTRAMUSCULAR | Status: AC
  Administered 2016-09-10: 0.5 mL via INTRAMUSCULAR
  Filled 2016-09-10: qty 0.5

## 2016-09-10 NOTE — ED Notes (Signed)
Affected toe irrigated with 1L NS. Sterile gauze placed on top. Suture cart at bedside. PT has call bell at bedside with no requests at this time.

## 2016-09-10 NOTE — ED Notes (Signed)
Pt wheeled to empty room to urinate. Pt placed on bedpan.

## 2016-09-10 NOTE — ED Notes (Addendum)
Knee immobilizer placed on left leg. PT and husband given instructions for how to apply brace. PT reports she will use her walker at home. PT instructed to remove brace/ return to ED if brace becomes too tight and sensation is affected. PT instructed to call Dr. Archie Endo office in the AM.

## 2016-09-10 NOTE — ED Notes (Signed)
Patient transported to X-ray 

## 2016-09-10 NOTE — ED Provider Notes (Signed)
[facility] DEPT Provider Note   CSN: [id number] Arrival date & time: 09/10/16  1758     History   Chief Complaint Chief Complaint  Patient presents with  . Fall  . Foot Injury    HPI Ann Fletcher is a 83 y.o. female with PMHx  Arthritis, DM, HLD, HTN, CHF, osteoporosis, and persistent A-fib (anticoagulated on eliquis), Who presents today with chief complaint left knee pain and right great toe pain and injury secondary to mechanical fall which occurred earlier today. She states that she was outside wearing her bedroom slippers when she tripped due to her slippers and fell forward on the sidewalk. She denies hitting her head or loss of consciousness. She has not been ambulatory since the fall. EMS arrived and transported her directly to the ED. She denies numbness, tingling, weakness, bladder or bowel incontinence. She states pain in the left knee is moderate, worsened with palpation and movement. Right great toe pain is constant throbbing pain. She notes a laceration and deformity to the right great toe. Bleeding is currently controlled. She has not tried anything for her symptoms. She is unsure if she is UTD on her tetanus.  The history is provided by the patient.    Past Medical History:  Diagnosis Date  . Anxiety   . Arthritis   . Chronic diastolic CHF (congestive heart failure) ([place])    a. Acute exacerbation occurred in setting of AF.  . Diabetes mellitus without complication ([place])    type 2  . H/O bladder infections   . Hyperlipidemia   . Hypertension   . Hypothyroidism   . Lower extremity edema   . Osteoporosis   . Persistent atrial fibrillation ([place])    a. diagnosed in 10/2014 by PCP, underwent failed TEE DCCV on 10/08/14, loaded with amio, successfully DCCV on 10/15/14.    Patient Active Problem List   Diagnosis Date Noted  . PAF (paroxysmal atrial fibrillation) ([place]) 03/24/2015  . Type 2 diabetes mellitus with renal manifestations, controlled ([place]) 03/24/2015  .  Anticoagulated 10/13/2014  . Atrial fibrillation with RVR ([place]) 10/07/2014  . Essential hypertension 02/17/2014  . Hyperlipidemia 02/17/2014  . Lower extremity edema 02/17/2014  . Right carotid bruit 02/17/2014    Past Surgical History:  Procedure Laterality Date  . BACK SURGERY  2001, 2003, 2006   x 3  . CARDIOVERSION N/A 10/08/2014   Procedure: CARDIOVERSION;  Surgeon: [name], MD;  Location: [facility];  Service: Cardiovascular;  Laterality: N/A;  . CARDIOVERSION N/A 10/15/2014   Procedure: CARDIOVERSION;  Surgeon: [name], MD;  Location[facility] ENDOSCOPY;  Service: Cardiovascular;  Laterality: N/A;  . CARDIOVERSION N/A 11/15/2014   Procedure: CARDIOVERSION;  Surgeon: [name], MD;  Location: [facility];  Service: Cardiovascular;  Laterality: N/A;  . 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 TEE WITHOUT CARDIOVERSION N/A 10/08/2014   Procedure: TRANSESOPHAGEAL ECHOCARDIOGRAM (TEE);  Surgeon: [name], MD;  Location: [facility];  Service: Cardiovascular;  Laterality: N/A;  . TUBAL LIGATION      OB History    No data available       Home Medications    Prior to Admission medications   Medication Sig Start Date End Date Taking? Authorizing Provider  albuterol (PROVENTIL HFA;VENTOLIN HFA) 108 (90 BASE) MCG/ACT inhaler Inhale 2 puffs into the lungs 3 (three) times daily as needed for wheezing.    [provider]  amiodarone (PACERONE) 200 MG tablet TAKE ONE-HALF TABLET DAILY 01/13/16   [name],  Pearletha Forge, MD  atorvastatin (LIPITOR) 40 MG tablet Take 40 mg by mouth daily.    [provider]  Calcium Carbonate-Vitamin D (OSCAL 500/200 D-3 PO) Take 1 tablet by mouth 2 (two) times daily.    [provider]  diltiazem (CARDIZEM CD) 180 MG 24 hr capsule TAKE ONE CAPSULE EACH DAY 05/24/16   Lorretta Harp, MD  ELIQUIS 5 MG TABS tablet TAKE ONE TABLET TWICE DAILY 08/03/16   Sherran Needs, NP  fluticasone (FLONASE) 50 MCG/ACT nasal spray  Place 1 spray into both nostrils as needed. 11/02/14   [provider]  furosemide (LASIX) 80 MG tablet Take 1 tablet (80 mg total) by mouth daily. 11/21/15   Sherran Needs, NP  glimepiride (AMARYL) 4 MG tablet Take 4 mg by mouth daily with breakfast.     [provider]  levothyroxine (SYNTHROID, LEVOTHROID) 100 MCG tablet Take 1 tablet by mouth daily. 10/28/14   [provider]  LORazepam (ATIVAN) 1 MG tablet Take 1 mg by mouth at bedtime.     [provider]  metFORMIN (GLUCOPHAGE) 500 MG tablet Take 500 mg by mouth daily with breakfast.    [provider]  metoprolol succinate (TOPROL-XL) 100 MG 24 hr tablet Pt taking one tablet daily 10/20/15   Sherran Needs, NP  potassium chloride SA (K-DUR,KLOR-CON) 20 MEQ tablet Take 1 tablet (20 mEq total) by mouth 2 (two) times daily. 11/10/15   Lorretta Harp, MD  vitamin E 400 UNIT capsule Take 400 Units by mouth daily.    [provider]    Family History Family History  Problem Relation Age of Onset  . Pneumonia Mother   . Heart attack Father   . Diabetes Sister     Social History Social History  Substance Use Topics  . Smoking status: Never Smoker  . Smokeless tobacco: Never Used  . Alcohol use No     Allergies   Pradaxa [dabigatran etexilate mesylate]; Nsaids; Sulfa antibiotics; and Betadine [povidone iodine]   Review of Systems Review of Systems  Respiratory: Negative for shortness of breath.   Cardiovascular: Negative for chest pain.  Gastrointestinal: Negative for abdominal pain, nausea and vomiting.  Musculoskeletal: Positive for arthralgias.  Skin: Positive for wound.  Neurological: Negative for syncope, weakness, light-headedness, numbness and headaches.  All other systems reviewed and are negative.    Physical Exam Updated Vital Signs BP (!) 149/58   Pulse (!) 102   Temp 98.3 F (36.8 C) (Oral)   Resp 16   SpO2 97%   Physical Exam  Constitutional: She  appears well-developed and well-nourished.  HENT:  Head: Normocephalic and atraumatic.  No battle signs, no raccoons eyes, no rhinorrhea, no tenderness to palpation of the skull, or deformity or crepitus noted. Dentures securely in place  Eyes: Conjunctivae are normal. Pupils are equal, round, and reactive to light. Right eye exhibits no discharge. Left eye exhibits no discharge. No scleral icterus.  Neck: Normal range of motion. Neck supple. No JVD present. No tracheal deviation present.  No midline spine TTP, no paraspinal muscle tenderness  Cardiovascular: Normal rate and intact distal pulses.   2+ radial and DP/PT pulses bl, negative Homan's bl   Pulmonary/Chest: Effort normal.  Abdominal: Soft. There is no tenderness.  Musculoskeletal: She exhibits tenderness.  Limited range of motion on flexion of the left knee (110 degrees). No varus/valgus instability. Pain on palpation of the LCL, lateral joint line, and interior patellar pole. Negative anterior/posterior  drawer tests. No palpable swelling or masses posteriorly.   (see attached images) right great toe with plantar deviation deformity and laceration. Normal ROMI of the MTP joint, no movement of the distal phalanx. Possible portion of tendon observed from the proximal aspect of the laceration. Nail has been removed, this is baseline and unchanged due to onychomycosis per pt. Distal first digit ttp. 5/5 strength of BLE.  No midline spine ttp or paraspinal muscle tenderness. No deformity, crepitus, or step-off noted.  BUE normal ROM and strength.  Neurological: She is alert. No cranial nerve deficit or sensory deficit.  Fluent speech, no facial droop, sensation intact to soft touch of all four extremities   Skin: Skin is warm and dry. Capillary refill takes less than 2 seconds.  Superficial skin abrasion to the left palmar surface of the hand, bleeding controlled. No surrounding erythema.   Psychiatric: She has a normal mood and affect.  Her behavior is normal.            ED Treatments / Results  Labs (all labs ordered are listed, but only abnormal results are displayed) Labs Reviewed - No data to display  EKG  EKG Interpretation None       Radiology Dg Knee Complete 4 Views Left  Result Date: 09/10/2016 CLINICAL DATA:  Fall today, generalized left knee pain. EXAM: LEFT KNEE - COMPLETE 4+ VIEW COMPARISON:  None. FINDINGS: Moderate medial compartmental articular space narrowing. Transverse fracture of the patella at the junction of the upper and middle thirds, without significant distraction. Spurring at the distal quadriceps attachment site. Atherosclerotic vascular calcifications. Moderate knee effusion. Suspected mild chondrocalcinosis of the lateral meniscus. IMPRESSION: 1. Acute transverse fracture of the patella at the junction of the proximal and middle thirds. Moderate knee effusion. 2. Moderate degenerative chondral thinning in the medial compartment. 3. Quadriceps spurring. 4. Atherosclerotic vascular calcification. 5. 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 Electronically Signed   By: Van Clines M.D.   On: 09/10/2016 20:15   Dg Foot Complete Right  Result Date: 09/10/2016 CLINICAL DATA:  Fall today, great toe bleeding and pain. EXAM: RIGHT FOOT COMPLETE - 3+ VIEW COMPARISON:  None. FINDINGS: Gauze bandaging obscures bony detail. Bony demineralization is present. Interphalangeal articular space narrowing compatible with osteoarthritis. There is some associated spurring. I do not observe a definite fracture of the great toe. Vascular calcifications are present. IMPRESSION: 1. No definite fracture. Sensitivity reduced by gauze bandaging and bony demineralization. 2. Osteoarthritis with interphalangeal articular space narrowing. 3. Vascular calcifications. Electronically Signed   By: Van Clines M.D.   On: 09/10/2016 20:18    Procedures .Marland KitchenLaceration Repair Date/Time: 09/10/2016  10:24 PM Performed by: Rodell Perna A Authorized by: Rodell Perna A   Consent:    Consent obtained:  Verbal   Consent given by:  Patient   Risks discussed:  Infection, pain and poor cosmetic result   Alternatives discussed:  No treatment Anesthesia (see MAR for exact dosages):    Anesthesia method:  Local infiltration   Local anesthetic:  Lidocaine 2% w/o epi Laceration details:    Location:  Toe   Toe location:  R big toe   Length (cm):  3   Depth (mm):  3 Repair type:    Repair type:  Simple Pre-procedure details:    Preparation:  Patient was prepped and draped in usual sterile fashion Exploration:    Hemostasis achieved with:  Direct pressure   Wound exploration: wound explored through full range of motion and entire  depth of wound probed and visualized     Wound extent: areolar tissue violated, fascia violated and tendon damage     Wound extent: no underlying fracture noted     Tendon damage location:  Lower extremity   Lower extremity tendon damage location:  R EHL   Tendon damage extent:  Complete transection   Tendon repair plan:  Refer for evaluation Treatment:    Area cleansed with:  Betadine   Amount of cleaning:  Extensive   Irrigation solution:  Sterile saline   Irrigation method:  Pressure wash   Visualized foreign bodies/material removed: no   Skin repair:    Repair method:  Sutures   Suture size:  4-0   Suture material:  Prolene   Suture technique:  Simple interrupted   Number of sutures:  3 Approximation:    Approximation:  Loose   Vermilion border: well-aligned   Post-procedure details:    Dressing:  Non-adherent dressing and antibiotic ointment   Patient tolerance of procedure:  Tolerated well, no immediate complications   (including critical care time)  Medications Ordered in ED Medications  Tdap (BOOSTRIX) injection 0.5 mL (0.5 mLs Intramuscular Given 09/10/16 2142)  lidocaine (XYLOCAINE) 2 % (with pres) injection 200 mg (200 mg Intradermal Given  by Other 09/10/16 2142)     Initial Impression / Assessment and Plan / ED Course  I have reviewed the triage vital signs and the nursing notes.  Pertinent labs & imaging results that were available during my care of the patient were reviewed by me and considered in my medical decision making (see chart for details).     Patient with left knee pain and injury to right great toe secondary to mechanical fall earlier today. Afebrile, vital signs are stable, no focal neurological deficits. Low suspicion of TBI, ICH, or other intracranial abnormality. Low suspicion of chest or abdominal injury. X-ray of left knee shows acute transverse fracture of the patella at the junction of the proximal middle thirds with moderate knee effusion. No fracture of the great toe, however examination and probing of wound suggestive of complete transection of the EHL. Spoke with Dr. Percell Miller orthopedic surgeon, who recommended washout and loose approximation and follow-up in his office for reevaluation in the next 24-48 hours. Patient placed in knee immobilizer, 3 simple interrupted sutures were placed at the right great toe. Tetanus updated. Pain managed while in the ED. Patient in no acute distress on discharge. Discussed indications for return to the ED. Patient verbalized understanding of and agreement with plan and patient is stable for discharge home at this time.  Final Clinical Impressions(s) / ED Diagnoses   Final diagnoses:  Fall, initial encounter  Injury of right great toe, initial encounter  Closed nondisplaced transverse fracture of left patella, initial encounter    New Prescriptions Discharge Medication List as of 09/10/2016 10:23 PM       Renita Papa, PA-C 09/11/16 1458    Nat Christen, MD 09/17/16 1119

## 2016-09-10 NOTE — ED Triage Notes (Signed)
Pt arrives EMS with c/o pain and deformity at right foot after hitting foot on lip of sidewalk and fell. Denies other injuries or LOC.

## 2016-09-10 NOTE — ED Notes (Signed)
PT on bedpan. PT will be discharged when she is done voiding.

## 2016-09-10 NOTE — Discharge Instructions (Signed)
Call Dr. Archie Endo office tomorrow to make a follow-up appointment with the same day or Wednesday. They must evaluate your toe and knee as soon as possible. You may use the knee immobilizer to walk. You may take ibuprofen or Tylenol as needed for pain. He may apply ice or heat to the affected areas. Return to the ED if any concerning signs or symptoms develop.

## 2016-09-11 DIAGNOSIS — S92424A Nondisplaced fracture of distal phalanx of right great toe, initial encounter for closed fracture: Secondary | ICD-10-CM | POA: Diagnosis not present

## 2016-09-18 DIAGNOSIS — M25562 Pain in left knee: Secondary | ICD-10-CM | POA: Diagnosis not present

## 2016-09-18 DIAGNOSIS — S92424D Nondisplaced fracture of distal phalanx of right great toe, subsequent encounter for fracture with routine healing: Secondary | ICD-10-CM | POA: Diagnosis not present

## 2016-09-20 DIAGNOSIS — Z7409 Other reduced mobility: Secondary | ICD-10-CM | POA: Diagnosis not present

## 2016-09-20 DIAGNOSIS — S8992XD Unspecified injury of left lower leg, subsequent encounter: Secondary | ICD-10-CM | POA: Diagnosis not present

## 2016-09-20 DIAGNOSIS — E118 Type 2 diabetes mellitus with unspecified complications: Secondary | ICD-10-CM | POA: Diagnosis not present

## 2016-09-20 DIAGNOSIS — W19XXXD Unspecified fall, subsequent encounter: Secondary | ICD-10-CM | POA: Diagnosis not present

## 2016-09-25 DIAGNOSIS — M25562 Pain in left knee: Secondary | ICD-10-CM | POA: Diagnosis not present

## 2016-10-05 DIAGNOSIS — M25562 Pain in left knee: Secondary | ICD-10-CM | POA: Diagnosis not present

## 2016-10-15 DIAGNOSIS — M25562 Pain in left knee: Secondary | ICD-10-CM | POA: Diagnosis not present

## 2016-10-16 DIAGNOSIS — Z7409 Other reduced mobility: Secondary | ICD-10-CM | POA: Diagnosis not present

## 2016-10-17 ENCOUNTER — Ambulatory Visit (HOSPITAL_COMMUNITY): Payer: Self-pay | Admitting: Nurse Practitioner

## 2016-10-25 DIAGNOSIS — M25562 Pain in left knee: Secondary | ICD-10-CM | POA: Diagnosis not present

## 2016-11-01 ENCOUNTER — Ambulatory Visit: Payer: Self-pay | Admitting: Pulmonary Disease

## 2016-11-05 ENCOUNTER — Inpatient Hospital Stay (HOSPITAL_COMMUNITY): Admission: RE | Admit: 2016-11-05 | Payer: Self-pay | Source: Ambulatory Visit | Admitting: Nurse Practitioner

## 2016-11-07 ENCOUNTER — Ambulatory Visit (INDEPENDENT_AMBULATORY_CARE_PROVIDER_SITE_OTHER): Payer: Medicare Other | Admitting: Podiatry

## 2016-11-07 ENCOUNTER — Encounter: Payer: Self-pay | Admitting: Podiatry

## 2016-11-07 DIAGNOSIS — E1322 Other specified diabetes mellitus with diabetic chronic kidney disease: Secondary | ICD-10-CM

## 2016-11-07 NOTE — Progress Notes (Signed)
Complaint:  Visit Type: Patient returns to my office for continued preventative foot care services. Complaint: Patient states" my nails have grown long and thick and become painful to walk and wear shoes" Patient has been diagnosed with DM with no foot complications. The patient presents for preventative foot care services. No changes to ROS  Podiatric Exam: Vascular: dorsalis pedis and posterior tibial pulses are palpable bilateral. Capillary return is immediate. Temperature gradient is WNL. Skin turgor WNL  Sensorium: Normal Semmes Weinstein monofilament test. Normal tactile sensation bilaterally. Nail Exam: Pt has thick disfigured discolored nails with subungual debris noted bilateral all nails except hallux toes  B/L Ulcer Exam: There is no evidence of ulcer or pre-ulcerative changes or infection. Orthopedic Exam: Muscle tone and strength are WNL. No limitations in general ROM. No crepitus or effusions noted. HAV  B/L.  Hammer toes second left Skin: No Porokeratosis. No infection or ulcers  Diagnosis:  Onychomycosis, , Pain in right toe, pain in left toes  Treatment & Plan Procedures and Treatment: Consent by patient was obtained for treatment procedures. The patient understood the discussion of treatment and procedures well. All questions were answered thoroughly reviewed. Debridement of mycotic and hypertrophic toenails, 1 through 5 bilateral and clearing of subungual debris. No ulceration, no infection noted.  Return Visit-Office Procedure: Patient instructed to return to the office for a follow up visit 3 months for continued evaluation and treatment.    Shayon Trompeter DPM 

## 2016-11-08 ENCOUNTER — Other Ambulatory Visit (HOSPITAL_COMMUNITY): Payer: Self-pay | Admitting: Nurse Practitioner

## 2016-11-16 DIAGNOSIS — S82846A Nondisplaced bimalleolar fracture of unspecified lower leg, initial encounter for closed fracture: Secondary | ICD-10-CM | POA: Diagnosis not present

## 2016-11-19 ENCOUNTER — Inpatient Hospital Stay (HOSPITAL_COMMUNITY): Admission: RE | Admit: 2016-11-19 | Payer: Self-pay | Source: Ambulatory Visit | Admitting: Nurse Practitioner

## 2016-12-10 ENCOUNTER — Other Ambulatory Visit (HOSPITAL_COMMUNITY): Payer: Self-pay | Admitting: Nurse Practitioner

## 2016-12-17 DIAGNOSIS — S82846A Nondisplaced bimalleolar fracture of unspecified lower leg, initial encounter for closed fracture: Secondary | ICD-10-CM | POA: Diagnosis not present

## 2016-12-18 ENCOUNTER — Ambulatory Visit (INDEPENDENT_AMBULATORY_CARE_PROVIDER_SITE_OTHER): Payer: Medicare Other | Admitting: Pulmonary Disease

## 2016-12-18 ENCOUNTER — Encounter: Payer: Self-pay | Admitting: Pulmonary Disease

## 2016-12-18 DIAGNOSIS — J849 Interstitial pulmonary disease, unspecified: Secondary | ICD-10-CM

## 2016-12-18 NOTE — Patient Instructions (Addendum)
We will schedule you for a repeat high-resolution CT and pulmonary function test around May 2019 Follow up in clinic after these tests to review results.

## 2016-12-18 NOTE — Progress Notes (Signed)
Ann Fletcher    [id number]    11-Sep-1932  Primary Care Physician:Ramachandran, Mauro Kaufmann, MD  Referring Physician: [name], MD [address] [place], [place] [address]  Chief complaint:   NSIP fibrosis  History of paroxysmal atrial fibrillation on amiodarone  HPI: Ann Fletcher is a 83 year old with past medical history of paroxysmal atrial fibrillation first diagnosed in 2016 she underwent cardioversion which was unsuccessful. She was subsequently placed on amiodarone and a repeat cardioversion. She has been maintained on amiodarone since 2016. Recent PFTs was concerning for restriction and TLC impairment and she has been referred here for further evaluation. She has history of DVT in 2010 and was treated with Coumadin. She has been off anticoagulation until it was resumed in the setting of atrial fibrillation. She did not tolerate Pradaxa and is currently on Eliquis.  She complains of dyspnea on exertion, occasional wheeze. Denies any cough, sputum production. She is a lifelong nonsmoker with no known exposures.  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 We did ILD profile which was negative. After discussion with cardiology just taken off amiodarone. She is here for follow-up She reports stable dyspnea. No new complaints. She continues on eliquis and has anemia which is being worked up by her primary care. There is no evidence of active bleed  Outpatient Encounter Prescriptions as of 12/18/2016  Medication Sig  . albuterol (PROVENTIL HFA;VENTOLIN HFA) 108 (90 BASE) MCG/ACT inhaler Inhale 2 puffs into the lungs 3 (three) times daily as needed for wheezing.  [name] amiodarone (PACERONE) 200 MG tablet TAKE ONE-HALF TABLET DAILY  . atorvastatin (LIPITOR) 40 MG tablet Take 40 mg by mouth daily.  . Calcium Carbonate-Vitamin D  (OSCAL 500/200 D-3 PO) Take 1 tablet by mouth 2 (two) times daily.  [name] diltiazem (CARDIZEM CD) 180 MG 24 hr capsule TAKE ONE CAPSULE EACH DAY  . ELIQUIS 5 MG TABS tablet TAKE ONE TABLET TWICE DAILY  . ELIQUIS 5 MG TABS tablet TAKE ONE TABLET TWICE DAILY  . fluticasone (FLONASE) 50 MCG/ACT nasal spray Place 1 spray into both nostrils as needed.  . furosemide (LASIX) 80 MG tablet Take 1 tablet (80 mg total) by mouth daily.  [name] glimepiride (AMARYL) 4 MG tablet Take 4 mg by mouth daily with breakfast.   . levothyroxine (SYNTHROID, LEVOTHROID) 100 MCG tablet Take 1 tablet by mouth daily.  [name] LORazepam (ATIVAN) 1 MG tablet Take 1 mg by mouth at bedtime.   . metFORMIN (GLUCOPHAGE) 500 MG tablet Take 500 mg by mouth daily with breakfast.  . metoprolol succinate (TOPROL-XL) 100 MG 24 hr tablet Pt taking one tablet daily  . potassium chloride SA (K-DUR,KLOR-CON) 20 MEQ tablet Take 1 tablet (20 mEq total) by mouth 2 (two) times daily.  . vitamin E 400 UNIT capsule Take 400 Units by mouth daily.   No facility-administered encounter medications on file as of 12/18/2016.     Allergies as of 12/18/2016 - Review Complete 12/18/2016  Allergen Reaction Noted  . Pradaxa [dabigatran etexilate mesylate] Nausea And Vomiting 03/24/2015  . Nsaids Other (See Comments) 12/13/2012  . Sulfa antibiotics Nausea And Vomiting and Other (See Comments) 10/29/2014  . Betadine [povidone iodine] Itching 02/17/2014    Past Medical History:  Diagnosis Date  . Anxiety   . Arthritis   . Chronic diastolic CHF (congestive heart failure) ([place])  a. Acute exacerbation occurred in setting of AF.  . Diabetes mellitus without complication ([place])    type 2  . H/O bladder infections   . Hyperlipidemia   . Hypertension   . Hypothyroidism   . Lower extremity edema   . Osteoporosis   . Persistent atrial fibrillation ([place])    a. diagnosed in 10/2014 by PCP, underwent failed TEE DCCV on 10/08/14, loaded with amio, successfully DCCV on  10/15/14.    Past Surgical History:  Procedure Laterality Date  . BACK SURGERY  2001, 2003, 2006   x 3  . CARDIOVERSION N/A 10/08/2014   Procedure: CARDIOVERSION;  Surgeon: [name], MD;  Location: [facility];  Service: Cardiovascular;  Laterality: N/A;  . CARDIOVERSION N/A 10/15/2014   Procedure: CARDIOVERSION;  Surgeon: [name], MD;  Location: [facility] ENDOSCOPY;  Service: Cardiovascular;  Laterality: N/A;  . CARDIOVERSION N/A 11/15/2014   Procedure: CARDIOVERSION;  Surgeon: [name], MD;  Location: [facility];  Service: Cardiovascular;  Laterality: N/A;  . 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 TEE WITHOUT CARDIOVERSION N/A 10/08/2014   Procedure: TRANSESOPHAGEAL ECHOCARDIOGRAM (TEE);  Surgeon: [name], MD;  Location[facility] ENDOSCOPY;  Service: Cardiovascular;  Laterality: N/A;  . TUBAL LIGATION      Family History  Problem Relation Age of Onset  . Pneumonia Mother   . Heart attack Father   . Diabetes Sister     Social History   Social History  . Marital status: Married    Spouse name: N/A  . Number of children: N/A  . Years of education: N/A   Occupational History  . Not on file.   Social History Main Topics  . Smoking status: Never Smoker  . Smokeless tobacco: Never Used  . Alcohol use No  . Drug use: No  . Sexual activity: Yes    Birth control/ protection: Surgical   Other Topics Concern  . Not on file   Social History Narrative  . No narrative on file    Review of systems: Review of Systems  Constitutional: Negative for fever and chills.  HENT: Negative.   Eyes: Negative for blurred vision.  Respiratory: as per HPI  Cardiovascular: Negative for chest pain and palpitations.  Gastrointestinal: Negative for vomiting, diarrhea, blood per rectum. Genitourinary: Negative for dysuria, urgency, frequency and hematuria.  Musculoskeletal: Negative for myalgias, back pain and joint pain.  Skin: Negative for itching and rash.  Neurological:  Negative for dizziness, tremors, focal weakness, seizures and loss of consciousness.  Endo/Heme/Allergies: Negative for environmental allergies.  Psychiatric/Behavioral: Negative for depression, suicidal ideas and hallucinations.  All other systems reviewed and are negative.  Physical Exam: Blood pressure 124/72, pulse (!) 106, height 5\' 3"  (1.6 m), weight 158 lb (71.7 kg), SpO2 96 %. Gen:      No acute distress HEENT:  EOMI, sclera anicteric Neck:     No masses; no thyromegaly Lungs:    Clear to auscultation bilaterally; normal respiratory effort CV:         Regular rate and rhythm; no murmurs Abd:      + bowel sounds; soft, non-tender; no palpable masses, no distension Ext:    No edema; adequate peripheral perfusion Skin:      Warm and dry; no rash Neuro: alert and oriented x 3 Psych: normal mood and affect  Data Reviewed: CXR 10/14/14- cardiomegaly, pulmonary vascular congestion Chest x-ray 10/07/14-cardiomegaly, pulmonary vascular congestion CT scan chest 07/24/08-no pulmonary embolism, mosaic attenuation High res CT 08/10/16-  patchy groundglass and reticulation at the bases. I have reviewed all images personally  Echocardiogram 10/07/14 - Left ventricle: The cavity size was normal. Wall thickness was   normal. Systolic function was normal. The estimated ejection   fraction was in the range of 50% to 55%. - Mitral valve: There was mild regurgitation. - Left atrium: The atrium was moderately dilated. - Right ventricle: The cavity size was moderately dilated. - Right atrium: The atrium was severely dilated. - Tricuspid valve: There was severe regurgitation. - Pericardium, extracardiac: Small posterior pericardial effusion  LE Dopplers 09/07/08-right popliteal DVT  PFTs 06/29/16 FVC 2.29 (57%), FEV1 1.69 (77%), F/F 73, SVC 58%, DLCO 49% Moderate restriction with diffusion impairment. No obstruction This does not meet ATS standards for to be repeatability.  ILD panel 5/16- Negative  except for pANCA 1:80 and RNP 26 Hypersensitivity profile 08/15/16- negative  Assessment:  Evaluation for abnormal PFTs, ILD Review of her PFTs show reduction in slow vital capacity and diffusion. She was unable to complete plethysmography. Tests do not meet ATS criteria for dependability and reproducibility as she cannot follow directions, so there conclusion of restriction and diffusion impairment are not definite.   High-resolution CT reviewed. It does show mild interstitial lung disease which could be from amiodarone toxicity. We have stopped the amiodarone and we'll continue to monitor closely with repeat PFTs and CT scan. If there is progression in spite of stopping amiodarone then we may need to reevaluate and consider anti-fibrotic therapy.  Plan/Recommendations: - Follow up high res CT and PFTs  Marshell Garfinkel MD [facility] Pager [phone] 12/18/2016, 11:14 AM  CC: [name], MD

## 2016-12-21 ENCOUNTER — Other Ambulatory Visit: Payer: Self-pay | Admitting: Nurse Practitioner

## 2017-01-15 ENCOUNTER — Ambulatory Visit (HOSPITAL_COMMUNITY)
Admission: RE | Admit: 2017-01-15 | Discharge: 2017-01-15 | Disposition: A | Discharge status: Home / Self Care | Payer: Medicare Other | Source: Ambulatory Visit | Attending: Nurse Practitioner | Admitting: Nurse Practitioner

## 2017-01-15 ENCOUNTER — Encounter (HOSPITAL_COMMUNITY): Payer: Self-pay | Admitting: Nurse Practitioner

## 2017-01-15 VITALS — BP 136/62 | HR 113 | Ht 63.0 in | Wt 155.6 lb

## 2017-01-15 DIAGNOSIS — Z888 Allergy status to other drugs, medicaments and biological substances status: Secondary | ICD-10-CM | POA: Insufficient documentation

## 2017-01-15 DIAGNOSIS — Z8489 Family history of other specified conditions: Secondary | ICD-10-CM | POA: Insufficient documentation

## 2017-01-15 DIAGNOSIS — I5032 Chronic diastolic (congestive) heart failure: Secondary | ICD-10-CM | POA: Insufficient documentation

## 2017-01-15 DIAGNOSIS — Z8249 Family history of ischemic heart disease and other diseases of the circulatory system: Secondary | ICD-10-CM | POA: Diagnosis not present

## 2017-01-15 DIAGNOSIS — I110 Hypertensive heart disease with heart failure: Secondary | ICD-10-CM | POA: Insufficient documentation

## 2017-01-15 DIAGNOSIS — I481 Persistent atrial fibrillation: Secondary | ICD-10-CM | POA: Diagnosis not present

## 2017-01-15 DIAGNOSIS — Z7901 Long term (current) use of anticoagulants: Secondary | ICD-10-CM | POA: Insufficient documentation

## 2017-01-15 DIAGNOSIS — Z886 Allergy status to analgesic agent status: Secondary | ICD-10-CM | POA: Diagnosis not present

## 2017-01-15 DIAGNOSIS — I4819 Other persistent atrial fibrillation: Secondary | ICD-10-CM

## 2017-01-15 DIAGNOSIS — Z79899 Other long term (current) drug therapy: Secondary | ICD-10-CM | POA: Diagnosis not present

## 2017-01-15 DIAGNOSIS — Z833 Family history of diabetes mellitus: Secondary | ICD-10-CM | POA: Diagnosis not present

## 2017-01-15 DIAGNOSIS — Z7984 Long term (current) use of oral hypoglycemic drugs: Secondary | ICD-10-CM | POA: Diagnosis not present

## 2017-01-15 DIAGNOSIS — F419 Anxiety disorder, unspecified: Secondary | ICD-10-CM | POA: Diagnosis not present

## 2017-01-15 DIAGNOSIS — E039 Hypothyroidism, unspecified: Secondary | ICD-10-CM | POA: Insufficient documentation

## 2017-01-15 DIAGNOSIS — Z882 Allergy status to sulfonamides status: Secondary | ICD-10-CM | POA: Insufficient documentation

## 2017-01-15 DIAGNOSIS — E785 Hyperlipidemia, unspecified: Secondary | ICD-10-CM | POA: Diagnosis not present

## 2017-01-15 DIAGNOSIS — Z9889 Other specified postprocedural states: Secondary | ICD-10-CM | POA: Insufficient documentation

## 2017-01-15 DIAGNOSIS — R600 Localized edema: Secondary | ICD-10-CM | POA: Insufficient documentation

## 2017-01-15 DIAGNOSIS — E119 Type 2 diabetes mellitus without complications: Secondary | ICD-10-CM | POA: Insufficient documentation

## 2017-01-15 NOTE — Progress Notes (Signed)
Patient ID: Ann Fletcher, female   DOB: 10/19/1932, 83 y.o.   MRN: [id number]      Primary Care Physician: [name], MD Referring Physician: Melina Copa, PA-C Cardiologist: Dr. Eulah [name] is a 83 y.o. female with a h/o HTN, DM, HLD, lower extremity edema, hypothyroidism, paroxysmal afib with /acute diastolic CHF who presents for f/u in the afib clinic. She has h/o right carotid bruit with prior carotid US showing mild plaque only bilaterally. She had nuclear stress test in December 2015 which was negative with EF 77%. She was admitted to [facility] 7/7-7/15 with AF RVR. She had recently been diagnosed by PCP the week prior to admission after presenting with SOB. She was placed on Toprol and Pradaxa. Unfortunately, despite good outpatient effort, patient's heart rate continued to be elevated. She was seen by cardiology service and was admitted for planned TEE/DCCV. TSH and troponins were normal. Echocardiogram showed EF 50-55%, mild MR, severe TR, moderately dilated left atrium. On exam, she appeared fluid overloaded and was aggressively diuresed with IV diuresis. Her diltiazem was changed to IV form to allow for easier titration which was eventually transition to 240mg  daily. She was continued on 100mg  daily Toprol XL. She underwent TEE cardioversion on 10/08/2014 and received 3 synchronous shocks which were unsuccessful in converting her. Post procedure, she was started on IV amiodarone with the intention of repeat DC cardioversion after loading. After 7 days of loading with amiodarone 400 mg twice a day, she underwent repeat DC cardioversion on 10/15/2014 under propofol. She was successfully converted from atrial fibrillation to atrial tachycardia with PACs after 3 attempts. It appears the last note during her hospitalization indicates she was back in atrial fib but with lower rate prior to discharge. Amiodarone was decreased to 200mg  BID with plans to reduce further to 200mg  daily at  1 month (~11/15/14). Discharge weight 171lbs (diuresed >7L during her admission). Of note CBC showed Hgb 11.0, microcytic with MCV 77.5. No recent LFTs.  When seen  by [name], PA, 7/29, she was feeling well. She was still in afib, HR around 100. She denied any CP, SOB, palpitations, dizziness or syncope. Her LEE has been minimal. Baseline weight was stable.  She denied LEE, orthopnea, PND. She is following a low-salt diet.   When initially seen in [name], 11/05/14,, she was still  in afib, HR around 100. She denies any CP, SOB, palpitations, dizziness or syncope. Her LEE is minimal. Baseline weight is hovering around 168lbs which she says is where she has been since discharge. Bmet was repeated and showed increase in creat and bun. She was asked to reduce lasix to 1/2 tab of 80 mg lasix daily. She denies LEE, orthopnea, PND.  Denied any bleeding. I discussed with Dr. [name] and he suggested since she has been on amiodarone now x one month that it would be reasonable to try one more DCCV. If she does not stay in SR, then rate control with low dose amiodarone may be indicated.  She was scheduled for DCCV on 8/15 and did convert to SR. However, unfortunately,  did have ERAF. She stated that she had n/v on Thursday,denied blood in the vomitus and denies blood in stool. Now no further vomiting but states her stomach stays upset after taking pradaxa and her appetitive has not been as good. Switched to Eliquis with better tolerance. .Also reduced amiodarone  to 200 mg  Dr. [name] suggested that she may need low dose  amiodarone to help with rate control. She does not have complaints of palpitations, weight is stable, down 2 lbs from last visit.  She was seen by [name] 8/30 and found to be in a junctional rhythm. Amiodarone dose was decreased to 100 mg daily and cardizem dose was cut in half. She was is in sinus brady with first degree AV block. She feels well, no further stomach complaints. Weight is  stable.  She returns 01/18/15 feeling well. Weight is up per pt(2 lbs) due to appetitive being good. Ankles are swollen and we discussed that she could increase lasix to a full tablet(80) mg if needed x 3 days only then return to 1/2 pill a day. In SR, has not noticed any afib. She is being compliant with blood thinner.  F/u afib clinic 10/20/15 and has no complaints. She is staying in [name] is stable. No unusual shortness of breath. Being compliant  with eliquis, no bleeding issues. Metformin bid made her nauseated so she takes only once a day. Her husband has had Bypass surgery since she was seen here last and did well.  Pt called to office and asked for appointment for being tired and not having much stamina. She thought she may be in afib. EKG today shows SR with first degree av block. Pr int 272 ms, qrs int 96 ms, qtc 472 ms.  She states that she has to stit down and rest after she irons a little bit. Her husband states that she cant walk a half of a block before she has to stop due to being tired and short of breath. She has been told she is anemic in the past.PFT's ordered.  F/u in the afib clinc 07/18/16. On last visit labs were drawn and showed mild increase in her anemia. She denies any visible blood in stool She is c/o fatigue and shortness of breath. Her results of PFT's showed possible interstitial disease and she is asked today to see Pulmonology for evaluation of results and if she can continue on amiodarone. She has been staying in [name].  F/u in afib clinic, 01/15/17, since seen last, she did see pulmonary and amiodarone was stopped early May due to mild intestinal changes. She is in afib with HR at 113 bpm.  No complaints of shortness of breath. No LLE. She now has a caregiver that comes into the house M-F. She checks her BP/HR but she cannot remember her numbers, she states, " she says its good." Continues on Eliquis  without any issues with bleeding. Still appropriately dosed at 5 mg bid.  Weight is good at 155 lb, on office visit in May weight was 165 lb. Unfortunately, pt and husband just got back form [place], her son died from [place].  Today, she denies symptoms of palpitations, chest pain,  orthopnea, PND,  Positive for fatigue and shortness of breath.  No dizziness, presyncope, syncope, or neurologic sequela.  Past Medical History:  Diagnosis Date  . Anxiety   . Arthritis   . Chronic diastolic CHF (congestive heart failure) ([place])    a. Acute exacerbation occurred in setting of AF.  . Diabetes mellitus without complication ([place])    type 2  . H/O bladder infections   . Hyperlipidemia   . Hypertension   . Hypothyroidism   . Lower extremity edema   . Osteoporosis   . Persistent atrial fibrillation ([place])    a. diagnosed in 10/2014 by PCP, underwent failed TEE DCCV on 10/08/14, loaded with amio,  successfully DCCV on 10/15/14.   Past Surgical History:  Procedure Laterality Date  . BACK SURGERY  2001, 2003, 2006   x 3  . CARDIOVERSION N/A 10/08/2014   Procedure: CARDIOVERSION;  Surgeon: [name], MD;  Location: [facility];  Service: Cardiovascular;  Laterality: N/A;  . CARDIOVERSION N/A 10/15/2014   Procedure: CARDIOVERSION;  Surgeon: [name], MD;  Location: [facility] ENDOSCOPY;  Service: Cardiovascular;  Laterality: N/A;  . CARDIOVERSION N/A 11/15/2014   Procedure: CARDIOVERSION;  Surgeon: [name], MD;  Location: [facility];  Service: Cardiovascular;  Laterality: N/A;  . 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 TEE WITHOUT CARDIOVERSION N/A 10/08/2014   Procedure: TRANSESOPHAGEAL ECHOCARDIOGRAM (TEE);  Surgeon: [name], MD;  Location: [facility];  Service: Cardiovascular;  Laterality: N/A;  . TUBAL LIGATION      Current Outpatient Prescriptions  Medication Sig Dispense Refill  . albuterol (PROVENTIL HFA;VENTOLIN HFA) 108 (90 BASE) MCG/ACT inhaler Inhale 2 puffs into the lungs 3 (three) times daily as needed for wheezing.    [name] atorvastatin (LIPITOR) 40  MG tablet Take 40 mg by mouth daily.    . Calcium Carbonate-Vitamin D (OSCAL 500/200 D-3 PO) Take 1 tablet by mouth 2 (two) times daily.    [name] diltiazem (CARDIZEM CD) 180 MG 24 hr capsule TAKE ONE CAPSULE EACH DAY 30 capsule 11  . ELIQUIS 5 MG TABS tablet TAKE ONE TABLET TWICE DAILY 60 tablet 6  . fluticasone (FLONASE) 50 MCG/ACT nasal spray Place 1 spray into both nostrils as needed.    . furosemide (LASIX) 80 MG tablet TAKE ONE TABLET EACH DAY 60 tablet 1  . levothyroxine (SYNTHROID, LEVOTHROID) 100 MCG tablet Take 1 tablet by mouth daily.    [name] LORazepam (ATIVAN) 1 MG tablet Take 1 mg by mouth at bedtime.     . metFORMIN (GLUCOPHAGE) 500 MG tablet Take 500 mg by mouth 2 (two) times daily with a meal.     . metoprolol succinate (TOPROL-XL) 100 MG 24 hr tablet Pt taking one tablet daily    . Omega-3 Fatty Acids (FISH OIL ADULT GUMMIES) 113.5 MG CHEW Chew by mouth.    . potassium chloride SA (K-DUR,KLOR-CON) 20 MEQ tablet Take 1 tablet (20 mEq total) by mouth 2 (two) times daily. 60 tablet 4  . vitamin E 400 UNIT capsule Take 400 Units by mouth daily.     No current facility-administered medications for this encounter.     Allergies  Allergen Reactions  . Pradaxa [Dabigatran Etexilate Mesylate] Nausea And Vomiting  . Nsaids Other (See Comments)    Stomach cramps  . Sulfa Antibiotics Nausea And Vomiting and Other (See Comments)    GI upset  . Betadine [Povidone Iodine] Itching    Social History   Social History  . Marital status: Married    Spouse name: N/A  . Number of children: N/A  . Years of education: N/A   Occupational History  . Not on file.   Social History Main Topics  . Smoking status: Never Smoker  . Smokeless tobacco: Never Used  . Alcohol use No  . Drug use: No  . Sexual activity: Yes    Birth control/ protection: Surgical   Other Topics Concern  . Not on file   Social History Narrative  . No narrative on file    Family History  Problem Relation Age of  Onset  . Pneumonia Mother   . Heart attack Father   . Diabetes Sister  ROS- All systems are reviewed and negative except as per the HPI above  Physical Exam: Vitals:   01/15/17 1056  BP: 136/62  Pulse: (!) 113  Weight: 155 lb 9.6 oz (70.6 kg)  Height: 5\' 3"  (1.6 m)    GEN- The patient is not in any distress, alert and oriented x 3 today.   Head- normocephalic, atraumatic Eyes-  Sclera clear, conjunctiva pink Ears- hearing intact Oropharynx- clear Neck- supple, no JVP Lymph- no cervical lymphadenopathy Lungs- Clear to ausculation bilaterally, normal work of breathing Heart-irregular rate and rhythm, no murmurs, rubs or gallops, PMI not laterally displaced GI- soft, NT, ND, + BS Extremities- no clubbing, cyanosis, or edema MS- no significant deformity or atrophy Skin- no rash or lesion Psych- euthymic mood, full affect Neuro- strength and sensation are intact  EKG-  afib at 113 bpm, qrs int 90 ms, qtc 474 ms Epic records reviewed PFT's-Conclusions: The diffusion defect, increased FEV1/FVC ratio and reduced FVC suggest an early interstitial process. In view of the severity of the diffusion defect, studies with exercise would be helpful to evaluate the presence of hypoxemia. Amiodarone stopped May 2018.  Assessment and Plan:  1. Persistent afib Has returned to afib since amiodarone was stopped Has no complaints today, dyspnea seems improved, LLE is not present today Continue Cardizem 180 mg a day Metoprolol 100 mg qd I will have caregiver call me with HR readings at home to make sure she is rate controlled Continue f/u with pulmonology  2. Chronic diastolic HF Weight stable Fluid status stable   F/u afib clinic in  1 year   Ann Fletcher, [facility] [address] [place], [place] [address] [phone]

## 2017-01-16 DIAGNOSIS — S82846A Nondisplaced bimalleolar fracture of unspecified lower leg, initial encounter for closed fracture: Secondary | ICD-10-CM | POA: Diagnosis not present

## 2017-01-25 ENCOUNTER — Telehealth (HOSPITAL_COMMUNITY): Payer: Self-pay | Admitting: *Deleted

## 2017-01-25 DIAGNOSIS — E782 Mixed hyperlipidemia: Secondary | ICD-10-CM | POA: Diagnosis not present

## 2017-01-25 DIAGNOSIS — E1165 Type 2 diabetes mellitus with hyperglycemia: Secondary | ICD-10-CM | POA: Diagnosis not present

## 2017-01-25 DIAGNOSIS — Z0000 Encounter for general adult medical examination without abnormal findings: Secondary | ICD-10-CM | POA: Diagnosis not present

## 2017-01-25 DIAGNOSIS — N390 Urinary tract infection, site not specified: Secondary | ICD-10-CM | POA: Diagnosis not present

## 2017-01-25 DIAGNOSIS — Z780 Asymptomatic menopausal state: Secondary | ICD-10-CM | POA: Diagnosis not present

## 2017-01-25 DIAGNOSIS — Z23 Encounter for immunization: Secondary | ICD-10-CM | POA: Diagnosis not present

## 2017-01-25 DIAGNOSIS — I129 Hypertensive chronic kidney disease with stage 1 through stage 4 chronic kidney disease, or unspecified chronic kidney disease: Secondary | ICD-10-CM | POA: Diagnosis not present

## 2017-01-25 MED ORDER — DILTIAZEM HCL ER COATED BEADS 120 MG PO CP24: 120.0000 mg | ORAL_CAPSULE | Freq: Every evening | ORAL | 1 refills | Status: DC

## 2017-01-25 NOTE — Telephone Encounter (Signed)
PT BP 108/65 Mon 117 HR 113/97 Tues 160/95 Wed 119 HR 89/73 Thurs 111 HR 143/72 Fri 124 HR Taken by patient.   Called in by patient caregiver Malka So  Per Butch Penny Cardizem 120 mg at bedtime.    Call back with HR and BP On Tues unless something come up over weekend or Monday and then call ASAP Caregiver understood instructions and will call Monday if more urgent but call tues with readings

## 2017-01-30 DIAGNOSIS — E1121 Type 2 diabetes mellitus with diabetic nephropathy: Secondary | ICD-10-CM | POA: Diagnosis not present

## 2017-01-30 DIAGNOSIS — E1165 Type 2 diabetes mellitus with hyperglycemia: Secondary | ICD-10-CM | POA: Diagnosis not present

## 2017-01-30 DIAGNOSIS — I482 Chronic atrial fibrillation: Secondary | ICD-10-CM | POA: Diagnosis not present

## 2017-01-30 DIAGNOSIS — E782 Mixed hyperlipidemia: Secondary | ICD-10-CM | POA: Diagnosis not present

## 2017-02-06 ENCOUNTER — Ambulatory Visit: Payer: Medicare Other | Admitting: Podiatry

## 2017-02-08 ENCOUNTER — Telehealth (HOSPITAL_COMMUNITY): Payer: Self-pay | Admitting: *Deleted

## 2017-02-08 NOTE — Telephone Encounter (Signed)
Pt cld reporting her BP this morning 108/73 and her Pulse rate 76.  She stated they have all been around this rate.  She has had no fast heart rates

## 2017-02-16 DIAGNOSIS — S82846A Nondisplaced bimalleolar fracture of unspecified lower leg, initial encounter for closed fracture: Secondary | ICD-10-CM | POA: Diagnosis not present

## 2017-02-26 DIAGNOSIS — M545 Low back pain: Secondary | ICD-10-CM | POA: Diagnosis not present

## 2017-02-26 DIAGNOSIS — E1165 Type 2 diabetes mellitus with hyperglycemia: Secondary | ICD-10-CM | POA: Diagnosis not present

## 2017-02-26 DIAGNOSIS — M5416 Radiculopathy, lumbar region: Secondary | ICD-10-CM | POA: Diagnosis not present

## 2017-02-26 DIAGNOSIS — E1121 Type 2 diabetes mellitus with diabetic nephropathy: Secondary | ICD-10-CM | POA: Diagnosis not present

## 2017-03-07 DIAGNOSIS — N200 Calculus of kidney: Secondary | ICD-10-CM | POA: Diagnosis not present

## 2017-03-07 DIAGNOSIS — R9389 Abnormal findings on diagnostic imaging of other specified body structures: Secondary | ICD-10-CM | POA: Diagnosis not present

## 2017-03-18 DIAGNOSIS — S82846A Nondisplaced bimalleolar fracture of unspecified lower leg, initial encounter for closed fracture: Secondary | ICD-10-CM | POA: Diagnosis not present

## 2017-04-18 DIAGNOSIS — S82846A Nondisplaced bimalleolar fracture of unspecified lower leg, initial encounter for closed fracture: Secondary | ICD-10-CM | POA: Diagnosis not present

## 2017-04-26 ENCOUNTER — Other Ambulatory Visit: Payer: Self-pay | Admitting: Nurse Practitioner

## 2017-05-07 DIAGNOSIS — E1165 Type 2 diabetes mellitus with hyperglycemia: Secondary | ICD-10-CM | POA: Diagnosis not present

## 2017-05-07 DIAGNOSIS — E1121 Type 2 diabetes mellitus with diabetic nephropathy: Secondary | ICD-10-CM | POA: Diagnosis not present

## 2017-05-14 DIAGNOSIS — E1165 Type 2 diabetes mellitus with hyperglycemia: Secondary | ICD-10-CM | POA: Diagnosis not present

## 2017-05-19 DIAGNOSIS — S82846A Nondisplaced bimalleolar fracture of unspecified lower leg, initial encounter for closed fracture: Secondary | ICD-10-CM | POA: Diagnosis not present

## 2017-05-28 ENCOUNTER — Ambulatory Visit (INDEPENDENT_AMBULATORY_CARE_PROVIDER_SITE_OTHER): Payer: Medicare Other | Admitting: Cardiovascular Disease

## 2017-05-28 ENCOUNTER — Encounter: Payer: Self-pay | Admitting: Cardiovascular Disease

## 2017-05-28 VITALS — BP 136/62 | HR 126 | Ht 63.0 in | Wt 152.2 lb

## 2017-05-28 DIAGNOSIS — I10 Essential (primary) hypertension: Secondary | ICD-10-CM | POA: Diagnosis not present

## 2017-05-28 MED ORDER — METOPROLOL SUCCINATE ER 100 MG PO TB24: ORAL_TABLET | ORAL | 1 refills | Status: DC

## 2017-05-28 NOTE — Progress Notes (Signed)
05/28/2017 Ann Fletcher   11-Jul-1932  [id number]  Primary Physician [name], MD Primary Cardiologist: Lorretta Harp MD [name], [place]  HPI:  MICHAELLE Fletcher is a 84 y.o.  moderately overweight married Caucasian female whose husband [name] is also a patient of mine as well and he accompanies her today.. She is a mother of 3 and grandmother and 5 grandchildren. She was referred by Dr. Ashby Dawes and for preoperative clearance before elective back surgery. I last saw her in the office 05/22/16 . Her cardiac risk factor profile is notable for treated hypertension, diabetes and hyperlipidemia. She does have bilateral looks to me edema. There is no family history of heart disease. She's never had a heart attack or stroke. She denies chest pain or shortness of breath. She apparently has back issues which may require surgery and was referred here for cardiovascular surgical clearance. She was hospitalized at [facility] from July 7 through the 15th with A. Fib with RVR and m edema. She was diuresed, loaded with amiodarone and ultimately cardioverted by Dr. Marlou Porch successfully after 3 shocks which ultimately did not hold. She saw edema and time PA-C in the office 10/29/14 in follow-up. Her heart rate at that time was 100. Today she feels somewhat weak and nauseated. Her EKG shows junctional bradycardia 40. She is on Eliquis . I'm decreased her amiodarone and referred her to the [name] where she was seen by Roderic Palau nurse practitioner.  She was in sinus rhythm when I saw her last however currently she is back in A. fib with RVR. Her amiodarone was discontinued. She is on oral anticoagulation as well as diltiazem and metoprolol.     Current Meds  Medication Sig  . albuterol (PROVENTIL HFA;VENTOLIN HFA) 108 (90 BASE) MCG/ACT inhaler Inhale 2 puffs into the lungs 3 (three) times daily as needed for wheezing.  [name] atorvastatin (LIPITOR) 40 MG tablet Take 40 mg by mouth  daily.  . Calcium Carbonate-Vitamin D (OSCAL 500/200 D-3 PO) Take 1 tablet by mouth 2 (two) times daily.  [name] diltiazem (CARDIZEM CD) 180 MG 24 hr capsule TAKE ONE CAPSULE EACH DAY  . diltiazem (CARDIZEM) 120 MG tablet Take 120 mg by mouth daily.  [name] ELIQUIS 5 MG TABS tablet TAKE ONE TABLET TWICE DAILY  . fluticasone (FLONASE) 50 MCG/ACT nasal spray Place 1 spray into both nostrils as needed.  . furosemide (LASIX) 80 MG tablet TAKE ONE TABLET EACH DAY  . levothyroxine (SYNTHROID, LEVOTHROID) 100 MCG tablet Take 1 tablet by mouth daily.  [name] LORazepam (ATIVAN) 1 MG tablet Take 1 mg by mouth at bedtime.   . metFORMIN (GLUCOPHAGE) 500 MG tablet Take 500 mg by mouth 2 (two) times daily with a meal.   . metoprolol succinate (TOPROL-XL) 100 MG 24 hr tablet Pt taking one tablet daily  . Omega-3 Fatty Acids (FISH OIL ADULT GUMMIES) 113.5 MG CHEW Chew by mouth.  . potassium chloride SA (K-DUR,KLOR-CON) 20 MEQ tablet Take 1 tablet (20 mEq total) by mouth 2 (two) times daily.  . vitamin E 400 UNIT capsule Take 400 Units by mouth daily.     Allergies  Allergen Reactions  . Pradaxa [Dabigatran Etexilate Mesylate] Nausea And Vomiting  . Nsaids Other (See Comments)    Stomach cramps  . Sulfa Antibiotics Nausea And Vomiting and Other (See Comments)    GI upset  . Betadine [Povidone Iodine] Itching    Social History   Socioeconomic History  .  Marital status: Married    Spouse name: Not on file  . Number of children: Not on file  . Years of education: Not on file  . Highest education level: Not on file  Social Needs  . Financial resource strain: Not on file  . Food insecurity - worry: Not on file  . Food insecurity - inability: Not on file  . Transportation needs - medical: Not on file  . Transportation needs - non-medical: Not on file  Occupational History  . Not on file  Tobacco Use  . Smoking status: Never Smoker  . Smokeless tobacco: Never Used  Substance and Sexual Activity  . Alcohol  use: No  . Drug use: No  . Sexual activity: Yes    Birth control/protection: Surgical  Other Topics Concern  . Not on file  Social History Narrative  . Not on file     Review of Systems: General: negative for chills, fever, night sweats or weight changes.  Cardiovascular: negative for chest pain, dyspnea on exertion, edema, orthopnea, palpitations, paroxysmal nocturnal dyspnea or shortness of breath Dermatological: negative for rash Respiratory: negative for cough or wheezing Urologic: negative for hematuria Abdominal: negative for nausea, vomiting, diarrhea, bright red blood per rectum, melena, or hematemesis Neurologic: negative for visual changes, syncope, or dizziness All other systems reviewed and are otherwise negative except as noted above.    Blood pressure 136/62, pulse (!) 126, height 5\' 3"  (1.6 m), weight 152 lb 3.2 oz (69 kg).  General appearance: alert and no distress Neck: no adenopathy, no carotid bruit, no JVD, supple, symmetrical, trachea midline and thyroid not enlarged, symmetric, no tenderness/mass/nodules Lungs: clear to auscultation bilaterally Heart: irregularly irregular rhythm Extremities: extremities normal, atraumatic, no cyanosis or edema Pulses: 2+ and symmetric Skin: Skin color, texture, turgor normal. No rashes or lesions Neurologic: Alert and oriented X 3, normal strength and tone. Normal symmetric reflexes. Normal coordination and gait  EKG A. fib with ventricular response of 126 and nonspecific ST-T wave changes with low voltage. I personally reviewed this EKG  ASSESSMENT AND PLAN:   Essential hypertension History of essential hypertension blood pressure measured today at 136/62. She is on diltiazem and metoprolol. Continue current meds at current dosing.    Hyperlipidemia History of hyperlipidemia on statin therapy followed by her PCP  Atrial fibrillation with RVR History of chronic atrial fibrillation on amiodarone in the past which was  discontinued and on Eliquis oral anticoagulation Her heart rate is 126 on high-dose diltiazem and long-acting metoprolol. I am going to increase her metoprolol by 50 mg a day.      Lorretta Harp MD FACP,FACC,FAHA, [facility] 05/28/2017 11:23 AM

## 2017-05-28 NOTE — Patient Instructions (Signed)
Medication Instructions: Your physician recommends that you continue on your current medications as directed. Please refer to the Current Medication list given to you today.  Increase Metoprolol--take 1 tablet (100 mg) every morning and 1/2 tablet (50 mg) every evening.   Follow-Up: Your physician recommends that you schedule a follow-up appointment in: 2 weeks with Roderic Palau, NP  Your physician wants you to follow-up in: 1 year with [name]. You will receive a reminder letter in the mail two months in advance. If you don't receive a letter, please call our office to schedule the follow-up appointment.  If you need a refill on your cardiac medications before your next appointment, please call your pharmacy.

## 2017-05-28 NOTE — Assessment & Plan Note (Signed)
History of hyperlipidemia on statin therapy followed by her PCP. 

## 2017-05-28 NOTE — Assessment & Plan Note (Signed)
History of essential hypertension blood pressure measured today at 136/62. She is on diltiazem and metoprolol. Continue current meds at current dosing.

## 2017-05-28 NOTE — Assessment & Plan Note (Signed)
History of chronic atrial fibrillation on amiodarone in the past which was discontinued and on Eliquis oral anticoagulation Her heart rate is 126 on high-dose diltiazem and long-acting metoprolol. I am going to increase her metoprolol by 50 mg a day.

## 2017-06-11 ENCOUNTER — Ambulatory Visit (HOSPITAL_COMMUNITY)
Admission: RE | Admit: 2017-06-11 | Discharge: 2017-06-11 | Disposition: A | Discharge status: Home / Self Care | Payer: Medicare Other | Source: Ambulatory Visit | Attending: Nurse Practitioner | Admitting: Nurse Practitioner

## 2017-06-11 VITALS — BP 128/84 | HR 129 | Ht 63.0 in | Wt 149.0 lb

## 2017-06-11 DIAGNOSIS — I5032 Chronic diastolic (congestive) heart failure: Secondary | ICD-10-CM | POA: Insufficient documentation

## 2017-06-11 DIAGNOSIS — Z79890 Hormone replacement therapy (postmenopausal): Secondary | ICD-10-CM | POA: Diagnosis not present

## 2017-06-11 DIAGNOSIS — Z7984 Long term (current) use of oral hypoglycemic drugs: Secondary | ICD-10-CM | POA: Insufficient documentation

## 2017-06-11 DIAGNOSIS — I481 Persistent atrial fibrillation: Secondary | ICD-10-CM | POA: Insufficient documentation

## 2017-06-11 DIAGNOSIS — F419 Anxiety disorder, unspecified: Secondary | ICD-10-CM | POA: Insufficient documentation

## 2017-06-11 DIAGNOSIS — Z7901 Long term (current) use of anticoagulants: Secondary | ICD-10-CM | POA: Diagnosis not present

## 2017-06-11 DIAGNOSIS — I4891 Unspecified atrial fibrillation: Secondary | ICD-10-CM | POA: Diagnosis not present

## 2017-06-11 DIAGNOSIS — M810 Age-related osteoporosis without current pathological fracture: Secondary | ICD-10-CM | POA: Insufficient documentation

## 2017-06-11 DIAGNOSIS — E119 Type 2 diabetes mellitus without complications: Secondary | ICD-10-CM | POA: Diagnosis not present

## 2017-06-11 DIAGNOSIS — I110 Hypertensive heart disease with heart failure: Secondary | ICD-10-CM | POA: Insufficient documentation

## 2017-06-11 DIAGNOSIS — E785 Hyperlipidemia, unspecified: Secondary | ICD-10-CM | POA: Diagnosis not present

## 2017-06-11 DIAGNOSIS — Z79899 Other long term (current) drug therapy: Secondary | ICD-10-CM | POA: Diagnosis not present

## 2017-06-11 DIAGNOSIS — E039 Hypothyroidism, unspecified: Secondary | ICD-10-CM | POA: Diagnosis not present

## 2017-06-11 MED ORDER — DILTIAZEM HCL ER COATED BEADS 120 MG PO CP24: 120.0000 mg | ORAL_CAPSULE | Freq: Two times a day (BID) | ORAL | 6 refills | Status: DC

## 2017-06-11 NOTE — Progress Notes (Signed)
Patient ID: Ann Fletcher, female   DOB: [birth year], 84 y.o.   MRN: [id number]      Primary Care Physician: [name], MD Referring Physician: Melina Copa, PA-C Cardiologist: Dr. Eulah [name] is a 84 y.o. female with a h/o HTN, DM, HLD, lower extremity edema, hypothyroidism, paroxysmal afib with /acute diastolic CHF who presents for f/u in the afib clinic. She has h/o right carotid bruit with prior carotid US showing mild plaque only bilaterally. She had nuclear stress test in December 2015 which was negative with EF 77%. She was admitted to [facility] 7/7-7/15 with AF RVR. She had recently been diagnosed by PCP the week prior to admission after presenting with SOB. She was placed on Toprol and Pradaxa. Unfortunately, despite good outpatient effort, patient's heart rate continued to be elevated. She was seen by cardiology service and was admitted for planned TEE/DCCV. TSH and troponins were normal. Echocardiogram showed EF 50-55%, mild MR, severe TR, moderately dilated left atrium. On exam, she appeared fluid overloaded and was aggressively diuresed with IV diuresis. Her diltiazem was changed to IV form to allow for easier titration which was eventually transition to 240mg  daily. She was continued on 100mg  daily Toprol XL. She underwent TEE cardioversion on 10/08/2014 and received 3 synchronous shocks which were unsuccessful in converting her. Post procedure, she was started on IV amiodarone with the intention of repeat DC cardioversion after loading. After 7 days of loading with amiodarone 400 mg twice a day, she underwent repeat DC cardioversion on 10/15/2014 under propofol. She was successfully converted from atrial fibrillation to atrial tachycardia with PACs after 3 attempts. It appears the last note during her hospitalization indicates she was back in atrial fib but with lower rate prior to discharge. Amiodarone was decreased to 200mg  BID with plans to reduce further to 200mg  daily at  1 month (~11/15/14). Discharge weight 171lbs (diuresed >7L during her admission). Of note CBC showed Hgb 11.0, microcytic with MCV 77.5. No recent LFTs.  When seen  by [name], PA, 7/29, she was feeling well. She was still in afib, HR around 100. She denied any CP, SOB, palpitations, dizziness or syncope. Her LEE has been minimal. Baseline weight was stable.  She denied LEE, orthopnea, PND. She is following a low-salt diet.   When initially seen in [facility], 11/05/14,, she was still  in afib, HR around 100. She denies any CP, SOB, palpitations, dizziness or syncope. Her LEE is minimal. Baseline weight is hovering around 168lbs which she says is where she has been since discharge. Bmet was repeated and showed increase in creat and bun. She was asked to reduce lasix to 1/2 tab of 80 mg lasix daily. She denies LEE, orthopnea, PND.  Denied any bleeding. I discussed with Dr. [name] and he suggested since she has been on amiodarone now x one month that it would be reasonable to try one more DCCV. If she does not stay in SR, then rate control with low dose amiodarone may be indicated.  She was scheduled for DCCV on 8/15 and did convert to SR. However, unfortunately,  did have ERAF. She stated that she had n/v on Thursday,denied blood in the vomitus and denies blood in stool. Now no further vomiting but states her stomach stays upset after taking pradaxa and her appetitive has not been as good. Switched to Eliquis with better tolerance. .Also reduced amiodarone  to 200 mg  Dr. [name] suggested that she may need low dose  amiodarone to help with rate control. She does not have complaints of palpitations, weight is stable, down 2 lbs from last visit.  She was seen by [name] 8/30 and found to be in a junctional rhythm. Amiodarone dose was decreased to 100 mg daily and cardizem dose was cut in half. She was is in sinus brady with first degree AV block. She feels well, no further stomach complaints. Weight is  stable.  She returns 01/18/15 feeling well. Weight is up per pt(2 lbs) due to appetitive being good. Ankles are swollen and we discussed that she could increase lasix to a full tablet(80) mg if needed x 3 days only then return to 1/2 pill a day. In SR, has not noticed any afib. She is being compliant with blood thinner.  F/u afib clinic 10/20/15 and has no complaints. She is staying in [name] is stable. No unusual shortness of breath. Being compliant  with eliquis, no bleeding issues. Metformin bid made her nauseated so she takes only once a day. Her husband has had Bypass surgery since she was seen here last and did well.  Pt called to office and asked for appointment for being tired and not having much stamina. She thought she may be in afib. EKG today shows SR with first degree av block. Pr int 272 ms, qrs int 96 ms, qtc 472 ms.  She states that she has to stit down and rest after she irons a little bit. Her husband states that she cant walk a half of a block before she has to stop due to being tired and short of breath. She has been told she is anemic in the past.PFT's ordered.  F/u in the afib clinc 07/18/16. On last visit labs were drawn and showed mild increase in her anemia. She denies any visible blood in stool She is c/o fatigue and shortness of breath. Her results of PFT's showed possible interstitial disease and she is asked today to see Pulmonology for evaluation of results and if she can continue on amiodarone. She has been staying in [place].  F/u in afib clinic, 01/15/17, since seen last, she did see pulmonary and amiodarone was stopped early May due to mild intestinal changes. She is in afib with HR at 113 bpm.  No complaints of shortness of breath. No LLE. She now has a caregiver that comes into the house M-F. She checks her BP/HR but she cannot remember her numbers, she states, " she says its good." Continues on Eliquis  without any issues with bleeding. Still appropriately dosed at 5 mg bid.  Weight is good at 155 lb, on office visit in May weight was 165 lb. Unfortunately, pt and husband just got back form [place], her son died from [place].  Return to afib clinic, 3/12. On last visit, caregiver called with HR/BP readings, and I added 120 Cardizem to am, 180 mg Cardizem. She was seen by [name] 2 weeks ago with RVR. He increased her BB and referred back here. On review of her meds, I think pt got confused with increase of cardizem on my last visit, dropped the 180 mg am dose and is just taking the pm 120 mg dose. Otherwise, feels well and has no complaints. Weight is stable and no LLE.   Today, she denies symptoms of palpitations, chest pain,  orthopnea, PND,  Positive for fatigue and shortness of breath.  No dizziness, presyncope, syncope, or neurologic sequela.  Past Medical History:  Diagnosis Date  .  Anxiety   . Arthritis   . Chronic diastolic CHF (congestive heart failure) ([place])    a. Acute exacerbation occurred in setting of AF.  . Diabetes mellitus without complication ([place])    type 2  . H/O bladder infections   . Hyperlipidemia   . Hypertension   . Hypothyroidism   . Lower extremity edema   . Osteoporosis   . Persistent atrial fibrillation ([place])    a. diagnosed in 10/2014 by PCP, underwent failed TEE DCCV on 10/08/14, loaded with amio, successfully DCCV on 10/15/14.   Past Surgical History:  Procedure Laterality Date  . BACK SURGERY  2001, 2003, 2006   x 3  . CARDIOVERSION N/A 10/08/2014   Procedure: CARDIOVERSION;  Surgeon: [name], MD;  Location: [facility];  Service: Cardiovascular;  Laterality: N/A;  . CARDIOVERSION N/A 10/15/2014   Procedure: CARDIOVERSION;  Surgeon: [name], MD;  Location: [facility] ENDOSCOPY;  Service: Cardiovascular;  Laterality: N/A;  . CARDIOVERSION N/A 11/15/2014   Procedure: CARDIOVERSION;  Surgeon: [name], MD;  Location: [facility];  Service: Cardiovascular;  Laterality: N/A;  . 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 TEE  WITHOUT CARDIOVERSION N/A 10/08/2014   Procedure: TRANSESOPHAGEAL ECHOCARDIOGRAM (TEE);  Surgeon: [name], MD;  Location: [facility];  Service: Cardiovascular;  Laterality: N/A;  . TUBAL LIGATION      Current Outpatient Medications  Medication Sig Dispense Refill  . albuterol (PROVENTIL HFA;VENTOLIN HFA) 108 (90 BASE) MCG/ACT inhaler Inhale 2 puffs into the lungs 3 (three) times daily as needed for wheezing.    [name] atorvastatin (LIPITOR) 40 MG tablet Take 40 mg by mouth daily.    . Calcium Carbonate-Vitamin D (OSCAL 500/200 D-3 PO) Take 1 tablet by mouth 2 (two) times daily.    [name] ELIQUIS 5 MG TABS tablet TAKE ONE TABLET TWICE DAILY 60 tablet 6  . fluticasone (FLONASE) 50 MCG/ACT nasal spray Place 1 spray into both nostrils as needed.    . furosemide (LASIX) 80 MG tablet TAKE ONE TABLET EACH DAY 30 tablet 6  . levothyroxine (SYNTHROID, LEVOTHROID) 100 MCG tablet Take 1 tablet by mouth daily.    [name] LORazepam (ATIVAN) 1 MG tablet Take 1 mg by mouth at bedtime.     . metFORMIN (GLUCOPHAGE) 500 MG tablet Take 500 mg by mouth 2 (two) times daily with a meal.     . metoprolol succinate (TOPROL-XL) 100 MG 24 hr tablet Take 1 tablet (100 mg) by mouth every morning and 1/2 tablet (50 mg) every evening. 135 tablet 1  . Omega-3 Fatty Acids (FISH OIL ADULT GUMMIES) 113.5 MG CHEW Chew by mouth.    . potassium chloride SA (K-DUR,KLOR-CON) 20 MEQ tablet Take 1 tablet (20 mEq total) by mouth 2 (two) times daily. 60 tablet 4  . traMADol (ULTRAM) 50 MG tablet Take by mouth every 6 (six) hours as needed.    . vitamin E (VITAMIN E) 400 UNIT capsule Take 400 Units by mouth daily.    [name] diltiazem (CARDIZEM CD) 120 MG 24 hr capsule Take 1 capsule (120 mg total) by mouth 2 (two) times daily. 60 capsule 6  . vitamin E 400 UNIT capsule Take 400 Units by mouth daily.     No current facility-administered medications for this encounter.     Allergies  Allergen Reactions  . Pradaxa [Dabigatran Etexilate Mesylate]  Nausea And Vomiting  . Nsaids Other (See Comments)    Stomach cramps  . Sulfa Antibiotics Nausea And Vomiting and  Other (See Comments)    GI upset  . Betadine [Povidone Iodine] Itching    Social History   Socioeconomic History  . Marital status: Married    Spouse name: Not on file  . Number of children: Not on file  . Years of education: Not on file  . Highest education level: Not on file  Social Needs  . Financial resource strain: Not on file  . Food insecurity - worry: Not on file  . Food insecurity - inability: Not on file  . Transportation needs - medical: Not on file  . Transportation needs - non-medical: Not on file  Occupational History  . Not on file  Tobacco Use  . Smoking status: Never Smoker  . Smokeless tobacco: Never Used  Substance and Sexual Activity  . Alcohol use: No  . Drug use: No  . Sexual activity: Yes    Birth control/protection: Surgical  Other Topics Concern  . Not on file  Social History Narrative  . Not on file    Family History  Problem Relation Age of Onset  . Pneumonia Mother   . Heart attack Father   . Diabetes Sister     ROS- All systems are reviewed and negative except as per the HPI above  Physical Exam: Vitals:   06/11/17 1108  BP: 128/84  Pulse: (!) 129  Weight: 149 lb (67.6 kg)  Height: 5\' 3"  (1.6 m)    GEN- The patient is not in any distress, alert and oriented x 3 today.   Head- normocephalic, atraumatic Eyes-  Sclera clear, conjunctiva pink Ears- hearing intact Oropharynx- clear Neck- supple, no JVP Lymph- no cervical lymphadenopathy Lungs- Clear to ausculation bilaterally, normal work of breathing Heart-irregular rate and rhythm, no murmurs, rubs or gallops, PMI not laterally displaced GI- soft, NT, ND, + BS Extremities- no clubbing, cyanosis, or edema MS- no significant deformity or atrophy Skin- no rash or lesion Psych- euthymic mood, full affect Neuro- strength and sensation are intact  EKG-  afib at  129 bpm, qrs int 80 ms, qtc 468 ms Epic records reviewed PFT's-Conclusions: The diffusion defect, increased FEV1/FVC ratio and reduced FVC suggest an early interstitial process. In view of the severity of the diffusion defect, studies with exercise would be helpful to evaluate the presence of hypoxemia. Amiodarone stopped May 2018.  Assessment and Plan:  1. Persistent afib Has returned to afib since amiodarone was stopped Tolerates well but now with RVR Has no complaints today, dyspnea seems improved, LLE is not present today, weight is stable Increased  Cardizem  to 120 mg bid Metoprolol 100 mg qd, 50 mg pm  2. Chronic diastolic HF Weight stable Fluid status stable   F/u afib clinic in 10 days to 2 weeks  Butch Penny C. Janice Bodine, [facility] [address] [place], [place] [address] [phone]

## 2017-06-11 NOTE — Patient Instructions (Signed)
Increase Cardizem (Diltiazem) to 120mg  two times a day

## 2017-06-16 DIAGNOSIS — S82846A Nondisplaced bimalleolar fracture of unspecified lower leg, initial encounter for closed fracture: Secondary | ICD-10-CM | POA: Diagnosis not present

## 2017-06-20 ENCOUNTER — Encounter (HOSPITAL_COMMUNITY): Payer: Self-pay | Admitting: Nurse Practitioner

## 2017-06-20 ENCOUNTER — Ambulatory Visit (HOSPITAL_COMMUNITY)
Admission: RE | Admit: 2017-06-20 | Discharge: 2017-06-20 | Disposition: A | Discharge status: Home / Self Care | Payer: Medicare Other | Source: Ambulatory Visit | Attending: Nurse Practitioner | Admitting: Nurse Practitioner

## 2017-06-20 VITALS — BP 146/86 | HR 116 | Ht 63.0 in | Wt 151.8 lb

## 2017-06-20 DIAGNOSIS — Z882 Allergy status to sulfonamides status: Secondary | ICD-10-CM | POA: Diagnosis not present

## 2017-06-20 DIAGNOSIS — E039 Hypothyroidism, unspecified: Secondary | ICD-10-CM | POA: Diagnosis not present

## 2017-06-20 DIAGNOSIS — M7989 Other specified soft tissue disorders: Secondary | ICD-10-CM | POA: Insufficient documentation

## 2017-06-20 DIAGNOSIS — I5032 Chronic diastolic (congestive) heart failure: Secondary | ICD-10-CM | POA: Diagnosis not present

## 2017-06-20 DIAGNOSIS — E785 Hyperlipidemia, unspecified: Secondary | ICD-10-CM | POA: Insufficient documentation

## 2017-06-20 DIAGNOSIS — Z79899 Other long term (current) drug therapy: Secondary | ICD-10-CM | POA: Diagnosis not present

## 2017-06-20 DIAGNOSIS — Z7984 Long term (current) use of oral hypoglycemic drugs: Secondary | ICD-10-CM | POA: Insufficient documentation

## 2017-06-20 DIAGNOSIS — F419 Anxiety disorder, unspecified: Secondary | ICD-10-CM | POA: Insufficient documentation

## 2017-06-20 DIAGNOSIS — I481 Persistent atrial fibrillation: Secondary | ICD-10-CM | POA: Diagnosis not present

## 2017-06-20 DIAGNOSIS — E119 Type 2 diabetes mellitus without complications: Secondary | ICD-10-CM | POA: Diagnosis not present

## 2017-06-20 DIAGNOSIS — I480 Paroxysmal atrial fibrillation: Secondary | ICD-10-CM | POA: Diagnosis not present

## 2017-06-20 DIAGNOSIS — I110 Hypertensive heart disease with heart failure: Secondary | ICD-10-CM | POA: Diagnosis not present

## 2017-06-20 DIAGNOSIS — I4891 Unspecified atrial fibrillation: Secondary | ICD-10-CM

## 2017-06-20 DIAGNOSIS — Z886 Allergy status to analgesic agent status: Secondary | ICD-10-CM | POA: Diagnosis not present

## 2017-06-20 MED ORDER — METOPROLOL SUCCINATE ER 100 MG PO TB24: 100.0000 mg | ORAL_TABLET | Freq: Two times a day (BID) | ORAL | 1 refills | Status: DC

## 2017-06-20 NOTE — Progress Notes (Signed)
Patient ID: Ann Fletcher, female   DOB: 1932/10/01, 84 y.o.   MRN: [id number]      Primary Care Physician: [name], MD Referring Physician: Melina Copa, PA-C Cardiologist: Dr. Eulah [name] is a 84 y.o. female with a h/o HTN, DM, HLD, lower extremity edema, hypothyroidism, paroxysmal afib with /acute diastolic CHF who presents for f/u in the afib clinic. She has h/o right carotid bruit with prior carotid US showing mild plaque only bilaterally. She had nuclear stress test in December 2015 which was negative with EF 77%. She was admitted to [facility] 7/7-7/15 with AF RVR. She had recently been diagnosed by PCP the week prior to admission after presenting with SOB. She was placed on Toprol and Pradaxa. Unfortunately, despite good outpatient effort, patient's heart rate continued to be elevated. She was seen by cardiology service and was admitted for planned TEE/DCCV. TSH and troponins were normal. Echocardiogram showed EF 50-55%, mild MR, severe TR, moderately dilated left atrium. On exam, she appeared fluid overloaded and was aggressively diuresed with IV diuresis. Her diltiazem was changed to IV form to allow for easier titration which was eventually transition to 240mg  daily. She was continued on 100mg  daily Toprol XL. She underwent TEE cardioversion on 10/08/2014 and received 3 synchronous shocks which were unsuccessful in converting her. Post procedure, she was started on IV amiodarone with the intention of repeat DC cardioversion after loading. After 7 days of loading with amiodarone 400 mg twice a day, she underwent repeat DC cardioversion on 10/15/2014 under propofol. She was successfully converted from atrial fibrillation to atrial tachycardia with PACs after 3 attempts. It appears the last note during her hospitalization indicates she was back in atrial fib but with lower rate prior to discharge. Amiodarone was decreased to 200mg  BID with plans to reduce further to 200mg  daily at  1 month (~11/15/14). Discharge weight 171lbs (diuresed >7L during her admission). Of note CBC showed Hgb 11.0, microcytic with MCV 77.5. No recent LFTs.  When seen  by [name], PA, 7/29, she was feeling well. She was still in afib, HR around 100. She denied any CP, SOB, palpitations, dizziness or syncope. Her LEE has been minimal. Baseline weight was stable.  She denied LEE, orthopnea, PND. She is following a low-salt diet.   When initially seen in [place] clinic, 11/05/14,, she was still  in afib, HR around 100. She denies any CP, SOB, palpitations, dizziness or syncope. Her LEE is minimal. Baseline weight is hovering around 168lbs which she says is where she has been since discharge. Bmet was repeated and showed increase in creat and bun. She was asked to reduce lasix to 1/2 tab of 80 mg lasix daily. She denies LEE, orthopnea, PND.  Denied any bleeding. I discussed with Dr. [name] and he suggested since she has been on amiodarone now x one month that it would be reasonable to try one more DCCV. If she does not stay in SR, then rate control with low dose amiodarone may be indicated.  She was scheduled for DCCV on 8/15 and did convert to SR. However, unfortunately,  did have ERAF. She stated that she had n/v on Thursday,denied blood in the vomitus and denies blood in stool. Now no further vomiting but states her stomach stays upset after taking pradaxa and her appetitive has not been as good. Switched to Eliquis with better tolerance. .Also reduced amiodarone  to 200 mg  Dr. [name] suggested that she may need low dose  amiodarone to help with rate control. She does not have complaints of palpitations, weight is stable, down 2 lbs from last visit.  She was seen by [name] 8/30 and found to be in a junctional rhythm. Amiodarone dose was decreased to 100 mg daily and cardizem dose was cut in half. She was is in sinus brady with first degree AV block. She feels well, no further stomach complaints. Weight is  stable.  She returns 01/18/15 feeling well. Weight is up per pt(2 lbs) due to appetitive being good. Ankles are swollen and we discussed that she could increase lasix to a full tablet(80) mg if needed x 3 days only then return to 1/2 pill a day. In SR, has not noticed any afib. She is being compliant with blood thinner.  F/u afib clinic 10/20/15 and has no complaints. She is staying in [place] is stable. No unusual shortness of breath. Being compliant  with eliquis, no bleeding issues. Metformin bid made her nauseated so she takes only once a day. Her husband has had Bypass surgery since she was seen here last and did well.  Pt called to office and asked for appointment for being tired and not having much stamina. She thought she may be in afib. EKG today shows SR with first degree av block. Pr int 272 ms, qrs int 96 ms, qtc 472 ms.  She states that she has to stit down and rest after she irons a little bit. Her husband states that she cant walk a half of a block before she has to stop due to being tired and short of breath. She has been told she is anemic in the past.PFT's ordered.  F/u in the afib clinc 07/18/16. On last visit labs were drawn and showed mild increase in her anemia. She denies any visible blood in stool She is c/o fatigue and shortness of breath. Her results of PFT's showed possible interstitial disease and she is asked today to see Pulmonology for evaluation of results and if she can continue on amiodarone. She has been staying in [place].  F/u in afib clinic, 01/15/17, since seen last, she did see pulmonary and amiodarone was stopped early May due to mild intestinal changes. She is in afib with HR at 113 bpm.  No complaints of shortness of breath. No LLE. She now has a caregiver that comes into the house M-F. She checks her BP/HR but she cannot remember her numbers, she states, " she says its good." Continues on Eliquis  without any issues with bleeding. Still appropriately dosed at 5 mg bid.  Weight is good at 155 lb, on office visit in May weight was 165 lb. Unfortunately, pt and husband just got back form [place], her son died from [place].  Return to afib clinic, 3/12. On last visit, caregiver called with HR/BP readings, and I added 120 Cardizem to am, 180 mg Cardizem. She was seen by [name] 2 weeks ago with RVR. He increased her BB and referred back here. On review of her meds, I think pt got confused with increase of cardizem on my last visit, dropped the 180 mg am dose and is just taking the pm 120 mg dose. Otherwise, feels well and has no complaints. Weight is stable and no LLE.   F/u in clinic, 3/22, for evaluation of rate control with increase of Cardizem at last visit. Her HR is better controlled around 110 but not optimal. Overall, she feels about the same. Gets intermittent nausea when  she takes her pills.   Today, she denies symptoms of palpitations, chest pain,  orthopnea, PND,  Positive for fatigue and shortness of breath.  No dizziness, presyncope, syncope, or neurologic sequela.  Past Medical History:  Diagnosis Date  . Anxiety   . Arthritis   . Chronic diastolic CHF (congestive heart failure) ([place])    a. Acute exacerbation occurred in setting of AF.  . Diabetes mellitus without complication ([place])    type 2  . H/O bladder infections   . Hyperlipidemia   . Hypertension   . Hypothyroidism   . Lower extremity edema   . Osteoporosis   . Persistent atrial fibrillation ([place])    a. diagnosed in 10/2014 by PCP, underwent failed TEE DCCV on 10/08/14, loaded with amio, successfully DCCV on 10/15/14.   Past Surgical History:  Procedure Laterality Date  . BACK SURGERY  2001, 2003, 2006   x 3  . CARDIOVERSION N/A 10/08/2014   Procedure: CARDIOVERSION;  Surgeon: [name], MD;  Location: [facility];  Service: Cardiovascular;  Laterality: N/A;  . CARDIOVERSION N/A 10/15/2014   Procedure: CARDIOVERSION;  Surgeon: [name], MD;  Location: [facility] ENDOSCOPY;  Service:  Cardiovascular;  Laterality: N/A;  . CARDIOVERSION N/A 11/15/2014   Procedure: CARDIOVERSION;  Surgeon: [name], MD;  Location: [facility];  Service: Cardiovascular;  Laterality: N/A;  . 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 TEE WITHOUT CARDIOVERSION N/A 10/08/2014   Procedure: TRANSESOPHAGEAL ECHOCARDIOGRAM (TEE);  Surgeon: [name], MD;  Location: [facility];  Service: Cardiovascular;  Laterality: N/A;  . TUBAL LIGATION      Current Outpatient Medications  Medication Sig Dispense Refill  . albuterol (PROVENTIL HFA;VENTOLIN HFA) 108 (90 BASE) MCG/ACT inhaler Inhale 2 puffs into the lungs 3 (three) times daily as needed for wheezing.    [name] atorvastatin (LIPITOR) 40 MG tablet Take 40 mg by mouth daily.    . Calcium Carbonate-Vitamin D (OSCAL 500/200 D-3 PO) Take 1 tablet by mouth 2 (two) times daily.    [name] diltiazem (CARDIZEM CD) 120 MG 24 hr capsule Take 1 capsule (120 mg total) by mouth 2 (two) times daily. 60 capsule 6  . ELIQUIS 5 MG TABS tablet TAKE ONE TABLET TWICE DAILY 60 tablet 6  . fluticasone (FLONASE) 50 MCG/ACT nasal spray Place 1 spray into both nostrils as needed.    . furosemide (LASIX) 80 MG tablet TAKE ONE TABLET EACH DAY 30 tablet 6  . levothyroxine (SYNTHROID, LEVOTHROID) 100 MCG tablet Take 1 tablet by mouth daily.    [name] LORazepam (ATIVAN) 1 MG tablet Take 1 mg by mouth at bedtime.     . metFORMIN (GLUCOPHAGE) 500 MG tablet Take 500 mg by mouth 2 (two) times daily with a meal.     . metoprolol succinate (TOPROL-XL) 100 MG 24 hr tablet Take 1 tablet (100 mg total) by mouth 2 (two) times daily. 180 tablet 1  . Omega-3 Fatty Acids (FISH OIL ADULT GUMMIES) 113.5 MG CHEW Chew by mouth.    . potassium chloride SA (K-DUR,KLOR-CON) 20 MEQ tablet Take 1 tablet (20 mEq total) by mouth 2 (two) times daily. 60 tablet 4  . traMADol (ULTRAM) 50 MG tablet Take by mouth every 6 (six) hours as needed.    . vitamin E (VITAMIN E) 400 UNIT capsule Take 400 Units by mouth daily.     . vitamin E 400 UNIT capsule Take 400 Units by mouth daily.     No current facility-administered medications for  this encounter.     Allergies  Allergen Reactions  . Pradaxa [Dabigatran Etexilate Mesylate] Nausea And Vomiting  . Nsaids Other (See Comments)    Stomach cramps  . Sulfa Antibiotics Nausea And Vomiting and Other (See Comments)    GI upset  . Betadine [Povidone Iodine] Itching    Social History   Socioeconomic History  . Marital status: Married    Spouse name: Not on file  . Number of children: Not on file  . Years of education: Not on file  . Highest education level: Not on file  Occupational History  . Not on file  Social Needs  . Financial resource strain: Not on file  . Food insecurity:    Worry: Not on file    Inability: Not on file  . Transportation needs:    Medical: Not on file    Non-medical: Not on file  Tobacco Use  . Smoking status: Never Smoker  . Smokeless tobacco: Never Used  Substance and Sexual Activity  . Alcohol use: No  . Drug use: No  . Sexual activity: Yes    Birth control/protection: Surgical  Lifestyle  . Physical activity:    Days per week: Not on file    Minutes per session: Not on file  . Stress: Not on file  Relationships  . Social connections:    Talks on phone: Not on file    Gets together: Not on file    Attends religious service: Not on file    Active member of club or organization: Not on file    Attends meetings of clubs or organizations: Not on file    Relationship status: Not on file  . Intimate partner violence:    Fear of current or ex partner: Not on file    Emotionally abused: Not on file    Physically abused: Not on file    Forced sexual activity: Not on file  Other Topics Concern  . Not on file  Social History Narrative  . Not on file    Family History  Problem Relation Age of Onset  . Pneumonia Mother   . Heart attack Father   . Diabetes Sister     ROS- All systems are reviewed and  negative except as per the HPI above  Physical Exam: Vitals:   06/20/17 1106  BP: (!) 146/86  Pulse: (!) 116  Weight: 151 lb 12.8 oz (68.9 kg)  Height: 5\' 3"  (1.6 m)    GEN- The patient is not in any distress, alert and oriented x 3 today.   Head- normocephalic, atraumatic Eyes-  Sclera clear, conjunctiva pink Ears- hearing intact Oropharynx- clear Neck- supple, no JVP Lymph- no cervical lymphadenopathy Lungs- Clear to ausculation bilaterally, normal work of breathing Heart-irregular rate and rhythm, no murmurs, rubs or gallops, PMI not laterally displaced GI- soft, NT, ND, + BS Extremities- no clubbing, cyanosis, or edema MS- no significant deformity or atrophy Skin- no rash or lesion Psych- euthymic mood, full affect Neuro- strength and sensation are intact  EKG-  afib at 116 bpm, qrs int 82 ms, qtc 455 ms Epic records reviewed PFT's-Conclusions: The diffusion defect, increased FEV1/FVC ratio and reduced FVC suggest an early interstitial process. In view of the severity of the diffusion defect, studies with exercise would be helpful to evaluate the presence of hypoxemia. Amiodarone stopped May 2018.  Assessment and Plan:  1. Persistent afib Has returned to afib since amiodarone was stopped Tolerates well but now with RVR Has no  complaints today, dyspnea seems improved, LLE is not present today, weight is stable Continue Cardizem  to 120 mg bid Metoprolol 100 mg qd, go to full tab( 100 mg ) in the pm  2. Chronic diastolic HF Weight stable Fluid status stable   F/u afib clinic in  2 weeks  Butch Penny C. Ajene Carchi, [facility] [address] [place], [place] [address] [phone]

## 2017-06-20 NOTE — Patient Instructions (Signed)
Increase metoprolol to 100 mg twice a day.   

## 2017-07-04 ENCOUNTER — Ambulatory Visit (HOSPITAL_COMMUNITY)
Admission: RE | Admit: 2017-07-04 | Discharge: 2017-07-04 | Disposition: A | Discharge status: Home / Self Care | Payer: Medicare Other | Source: Ambulatory Visit | Attending: Nurse Practitioner | Admitting: Nurse Practitioner

## 2017-07-04 ENCOUNTER — Other Ambulatory Visit (HOSPITAL_COMMUNITY): Payer: Self-pay | Admitting: *Deleted

## 2017-07-04 ENCOUNTER — Encounter (HOSPITAL_COMMUNITY): Payer: Self-pay | Admitting: Nurse Practitioner

## 2017-07-04 ENCOUNTER — Other Ambulatory Visit: Payer: Self-pay

## 2017-07-04 VITALS — BP 126/74 | HR 121 | Ht 63.0 in | Wt 155.2 lb

## 2017-07-04 DIAGNOSIS — Z7984 Long term (current) use of oral hypoglycemic drugs: Secondary | ICD-10-CM | POA: Insufficient documentation

## 2017-07-04 DIAGNOSIS — I5032 Chronic diastolic (congestive) heart failure: Secondary | ICD-10-CM | POA: Insufficient documentation

## 2017-07-04 DIAGNOSIS — Z882 Allergy status to sulfonamides status: Secondary | ICD-10-CM | POA: Diagnosis not present

## 2017-07-04 DIAGNOSIS — Z886 Allergy status to analgesic agent status: Secondary | ICD-10-CM | POA: Diagnosis not present

## 2017-07-04 DIAGNOSIS — I481 Persistent atrial fibrillation: Secondary | ICD-10-CM | POA: Insufficient documentation

## 2017-07-04 DIAGNOSIS — I4891 Unspecified atrial fibrillation: Secondary | ICD-10-CM | POA: Diagnosis not present

## 2017-07-04 DIAGNOSIS — F419 Anxiety disorder, unspecified: Secondary | ICD-10-CM | POA: Diagnosis not present

## 2017-07-04 DIAGNOSIS — Z79899 Other long term (current) drug therapy: Secondary | ICD-10-CM | POA: Diagnosis not present

## 2017-07-04 DIAGNOSIS — E039 Hypothyroidism, unspecified: Secondary | ICD-10-CM | POA: Diagnosis not present

## 2017-07-04 DIAGNOSIS — E119 Type 2 diabetes mellitus without complications: Secondary | ICD-10-CM | POA: Insufficient documentation

## 2017-07-04 DIAGNOSIS — E785 Hyperlipidemia, unspecified: Secondary | ICD-10-CM | POA: Diagnosis not present

## 2017-07-04 DIAGNOSIS — I110 Hypertensive heart disease with heart failure: Secondary | ICD-10-CM | POA: Diagnosis not present

## 2017-07-04 LAB — BASIC METABOLIC PANEL
Anion gap: 15 (ref 5–15)
BUN: 28 mg/dL — ABNORMAL HIGH (ref 6–20)
CO2: 25 mmol/L (ref 22–32)
Calcium: 10 mg/dL (ref 8.9–10.3)
Chloride: 101 mmol/L (ref 101–111)
Creatinine, Ser: 1.15 mg/dL — ABNORMAL HIGH (ref 0.44–1.00)
GFR calc Af Amer: 50 mL/min — ABNORMAL LOW (ref >60)
GFR calc non Af Amer: 43 mL/min — ABNORMAL LOW (ref >60)
Glucose, Bld: 212 mg/dL — ABNORMAL HIGH (ref 65–99)
Potassium: 4.4 mmol/L (ref 3.5–5.1)
Sodium: 141 mmol/L (ref 135–145)

## 2017-07-04 LAB — CBC
HCT: 32 % — ABNORMAL LOW (ref 36.0–46.0)
Hemoglobin: 9.9 g/dL — ABNORMAL LOW (ref 12.0–15.0)
MCH: 25 pg — ABNORMAL LOW (ref 26.0–34.0)
MCHC: 30.9 g/dL (ref 30.0–36.0)
MCV: 80.8 fL (ref 78.0–100.0)
Platelets: 209 K/uL (ref 150–400)
RBC: 3.96 MIL/uL (ref 3.87–5.11)
RDW: 16.2 % — ABNORMAL HIGH (ref 11.5–15.5)
WBC: 7.8 K/uL (ref 4.0–10.5)

## 2017-07-04 LAB — TSH: TSH: 0.553 u[IU]/mL (ref 0.350–4.500)

## 2017-07-04 MED ORDER — DILTIAZEM HCL ER COATED BEADS 120 MG PO CP24: ORAL_CAPSULE | ORAL | 6 refills | Status: DC

## 2017-07-04 NOTE — Patient Instructions (Signed)
Diltiazem (capsule) 2 in the morning and 1 in the evening.

## 2017-07-04 NOTE — Progress Notes (Signed)
Patient ID: Ann Fletcher, female   DOB: 02/06/1933, 84 y.o.   MRN: [id number]      Primary Care Physician: [name], MD Referring Physician: Melina Copa, PA-C Cardiologist: Dr. Eulah [name] is a 84 y.o. female with a h/o HTN, DM, HLD, lower extremity edema, hypothyroidism, paroxysmal afib with /acute diastolic CHF who presents for f/u in the afib clinic. She has h/o right carotid bruit with prior carotid US showing mild plaque only bilaterally. She had nuclear stress test in December 2015 which was negative with EF 77%. She was admitted to [facility] 7/7-7/15 with AF RVR. She had recently been diagnosed by PCP the week prior to admission after presenting with SOB. She was placed on Toprol and Pradaxa. Unfortunately, despite good outpatient effort, patient's heart rate continued to be elevated. She was seen by cardiology service and was admitted for planned TEE/DCCV. TSH and troponins were normal. Echocardiogram showed EF 50-55%, mild MR, severe TR, moderately dilated left atrium. On exam, she appeared fluid overloaded and was aggressively diuresed with IV diuresis. Her diltiazem was changed to IV form to allow for easier titration which was eventually transition to 240mg  daily. She was continued on 100mg  daily Toprol XL. She underwent TEE cardioversion on 10/08/2014 and received 3 synchronous shocks which were unsuccessful in converting her. Post procedure, she was started on IV amiodarone with the intention of repeat DC cardioversion after loading. After 7 days of loading with amiodarone 400 mg twice a day, she underwent repeat DC cardioversion on 10/15/2014 under propofol. She was successfully converted from atrial fibrillation to atrial tachycardia with PACs after 3 attempts. It appears the last note during her hospitalization indicates she was back in atrial fib but with lower rate prior to discharge. Amiodarone was decreased to 200mg  BID with plans to reduce further to 200mg  daily at  1 month (~11/15/14). Discharge weight 171lbs (diuresed >7L during her admission). Of note CBC showed Hgb 11.0, microcytic with MCV 77.5. No recent LFTs.  When seen  by [name], PA, 7/29, she was feeling well. She was still in afib, HR around 100. She denied any CP, SOB, palpitations, dizziness or syncope. Her LEE has been minimal. Baseline weight was stable.  She denied LEE, orthopnea, PND. She is following a low-salt diet.   When initially seen in [facility], 11/05/14,, she was still  in afib, HR around 100. She denies any CP, SOB, palpitations, dizziness or syncope. Her LEE is minimal. Baseline weight is hovering around 168lbs which she says is where she has been since discharge. Bmet was repeated and showed increase in creat and bun. She was asked to reduce lasix to 1/2 tab of 80 mg lasix daily. She denies LEE, orthopnea, PND.  Denied any bleeding. I discussed with Dr. [name] and he suggested since she has been on amiodarone now x one month that it would be reasonable to try one more DCCV. If she does not stay in SR, then rate control with low dose amiodarone may be indicated.  She was scheduled for DCCV on 8/15 and did convert to SR. However, unfortunately,  did have ERAF. She stated that she had n/v on Thursday,denied blood in the vomitus and denies blood in stool. Now no further vomiting but states her stomach stays upset after taking pradaxa and her appetitive has not been as good. Switched to Eliquis with better tolerance. .Also reduced amiodarone  to 200 mg  Dr. [name] suggested that she may need low dose  amiodarone to help with rate control. She does not have complaints of palpitations, weight is stable, down 2 lbs from last visit.  She was seen by [name] 8/30 and found to be in a junctional rhythm. Amiodarone dose was decreased to 100 mg daily and cardizem dose was cut in half. She was is in sinus brady with first degree AV block. She feels well, no further stomach complaints. Weight is  stable.  She returns 01/18/15 feeling well. Weight is up per pt(2 lbs) due to appetitive being good. Ankles are swollen and we discussed that she could increase lasix to a full tablet(80) mg if needed x 3 days only then return to 1/2 pill a day. In SR, has not noticed any afib. She is being compliant with blood thinner.  F/u afib clinic 10/20/15 and has no complaints. She is staying in [place] is stable. No unusual shortness of breath. Being compliant  with eliquis, no bleeding issues. Metformin bid made her nauseated so she takes only once a day. Her husband has had Bypass surgery since she was seen here last and did well.  Pt called to office and asked for appointment for being tired and not having much stamina. She thought she may be in afib. EKG today shows SR with first degree av block. Pr int 272 ms, qrs int 96 ms, qtc 472 ms.  She states that she has to stit down and rest after she irons a little bit. Her husband states that she cant walk a half of a block before she has to stop due to being tired and short of breath. She has been told she is anemic in the past.PFT's ordered.  F/u in the afib clinc 07/18/16. On last visit labs were drawn and showed mild increase in her anemia. She denies any visible blood in stool She is c/o fatigue and shortness of breath. Her results of PFT's showed possible interstitial disease and she is asked today to see Pulmonology for evaluation of results and if she can continue on amiodarone. She has been staying in [place].  F/u in afib clinic, 01/15/17, since seen last, she did see pulmonary and amiodarone was stopped early May due to mild intestinal changes. She is in afib with HR at 113 bpm.  No complaints of shortness of breath. No LLE. She now has a caregiver that comes into the house M-F. She checks her BP/HR but she cannot remember her numbers, she states, " she says its good." Continues on Eliquis  without any issues with bleeding. Still appropriately dosed at 5 mg bid.  Weight is good at 155 lb, on office visit in May weight was 165 lb. Unfortunately, pt and husband just got back form [place], her son died from [place].  Return to afib clinic, 06/11/17. On last visit, caregiver called with HR/BP readings, and I added 120 Cardizem to am, 180 mg Cardizem. She was seen by [name] 2 weeks ago with RVR. He increased her BB and referred back here. On review of her meds, I think pt got confused with increase of cardizem on my last visit, dropped the 180 mg am dose and is just taking the pm 120 mg dose. Otherwise, feels well and has no complaints. Weight is stable and no LLE.   F/u in clinic, 06/21/17, for evaluation of rate control with increase of Cardizem at last visit. Her HR is better controlled around 110 but not optimal. Overall, she feels about the same. Gets intermittent nausea when  she takes her pills.   F/u in afib clinic for evaluation of rate control. Despite increase in BB, she is still with RVR. States that she does not feel as well today, yesterday was at her normal. No LLE noted. Orthostatics checked and she is not orthostatic.  Today, she denies symptoms of palpitations, chest pain,  orthopnea, PND,  Positive for fatigue and shortness of breath.  No dizziness, presyncope, syncope, or neurologic sequela.  Past Medical History:  Diagnosis Date  . Anxiety   . Arthritis   . Chronic diastolic CHF (congestive heart failure) ([place])    a. Acute exacerbation occurred in setting of AF.  . Diabetes mellitus without complication ([place])    type 2  . H/O bladder infections   . Hyperlipidemia   . Hypertension   . Hypothyroidism   . Lower extremity edema   . Osteoporosis   . Persistent atrial fibrillation ([place])    a. diagnosed in 10/2014 by PCP, underwent failed TEE DCCV on 10/08/14, loaded with amio, successfully DCCV on 10/15/14.   Past Surgical History:  Procedure Laterality Date  . BACK SURGERY  2001, 2003, 2006   x 3  . CARDIOVERSION N/A 10/08/2014   Procedure:  CARDIOVERSION;  Surgeon: [name], MD;  Location: [place];  Service: Cardiovascular;  Laterality: N/A;  . CARDIOVERSION N/A 10/15/2014   Procedure: CARDIOVERSION;  Surgeon: [name], MD;  Location: [facility] ENDOSCOPY;  Service: Cardiovascular;  Laterality: N/A;  . CARDIOVERSION N/A 11/15/2014   Procedure: CARDIOVERSION;  Surgeon: [name], MD;  Location: [place];  Service: Cardiovascular;  Laterality: N/A;  . 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 TEE WITHOUT CARDIOVERSION N/A 10/08/2014   Procedure: TRANSESOPHAGEAL ECHOCARDIOGRAM (TEE);  Surgeon: [name], MD;  Location: [place];  Service: Cardiovascular;  Laterality: N/A;  . TUBAL LIGATION      Current Outpatient Medications  Medication Sig Dispense Refill  . albuterol (PROVENTIL HFA;VENTOLIN HFA) 108 (90 BASE) MCG/ACT inhaler Inhale 2 puffs into the lungs 3 (three) times daily as needed for wheezing.    [name] atorvastatin (LIPITOR) 40 MG tablet Take 40 mg by mouth daily.    . Calcium Carbonate-Vitamin D (OSCAL 500/200 D-3 PO) Take 1 tablet by mouth 2 (two) times daily.    [name] diltiazem (CARDIZEM CD) 120 MG 24 hr capsule Take 2 tablets in the morning and 1 tablet in the evening. 60 capsule 6  . ELIQUIS 5 MG TABS tablet TAKE ONE TABLET TWICE DAILY 60 tablet 6  . fluticasone (FLONASE) 50 MCG/ACT nasal spray Place 1 spray into both nostrils as needed.    . furosemide (LASIX) 80 MG tablet TAKE ONE TABLET EACH DAY 30 tablet 6  . levothyroxine (SYNTHROID, LEVOTHROID) 100 MCG tablet Take 1 tablet by mouth daily.    [name] LORazepam (ATIVAN) 1 MG tablet Take 1 mg by mouth at bedtime.     . metFORMIN (GLUCOPHAGE) 500 MG tablet Take 500 mg by mouth 2 (two) times daily with a meal.     . metoprolol succinate (TOPROL-XL) 100 MG 24 hr tablet Take 1 tablet (100 mg total) by mouth 2 (two) times daily. 180 tablet 1  . Omega-3 Fatty Acids (FISH OIL ADULT GUMMIES) 113.5 MG CHEW Chew by mouth.    . potassium chloride SA (K-DUR,KLOR-CON)  20 MEQ tablet Take 1 tablet (20 mEq total) by mouth 2 (two) times daily. 60 tablet 4  . traMADol (ULTRAM) 50 MG tablet Take by mouth every 6 (six) hours  as needed.    . vitamin E (VITAMIN E) 400 UNIT capsule Take 400 Units by mouth daily.    . vitamin E 400 UNIT capsule Take 400 Units by mouth daily.     No current facility-administered medications for this encounter.     Allergies  Allergen Reactions  . Pradaxa [Dabigatran Etexilate Mesylate] Nausea And Vomiting  . Nsaids Other (See Comments)    Stomach cramps  . Sulfa Antibiotics Nausea And Vomiting and Other (See Comments)    GI upset  . Betadine [Povidone Iodine] Itching    Social History   Socioeconomic History  . Marital status: Married    Spouse name: Not on file  . Number of children: Not on file  . Years of education: Not on file  . Highest education level: Not on file  Occupational History  . Not on file  Social Needs  . Financial resource strain: Not on file  . Food insecurity:    Worry: Not on file    Inability: Not on file  . Transportation needs:    Medical: Not on file    Non-medical: Not on file  Tobacco Use  . Smoking status: Never Smoker  . Smokeless tobacco: Never Used  Substance and Sexual Activity  . Alcohol use: No  . Drug use: No  . Sexual activity: Yes    Birth control/protection: Surgical  Lifestyle  . Physical activity:    Days per week: Not on file    Minutes per session: Not on file  . Stress: Not on file  Relationships  . Social connections:    Talks on phone: Not on file    Gets together: Not on file    Attends religious service: Not on file    Active member of club or organization: Not on file    Attends meetings of clubs or organizations: Not on file    Relationship status: Not on file  . Intimate partner violence:    Fear of current or ex partner: Not on file    Emotionally abused: Not on file    Physically abused: Not on file    Forced sexual activity: Not on file  Other  Topics Concern  . Not on file  Social History Narrative  . Not on file    Family History  Problem Relation Age of Onset  . Pneumonia Mother   . Heart attack Father   . Diabetes Sister     ROS- All systems are reviewed and negative except as per the HPI above  Physical Exam: Vitals:   07/04/17 1107  BP: 126/74  Pulse: (!) 121  SpO2: 94%  Weight: 155 lb 3.2 oz (70.4 kg)  Height: 5\' 3"  (1.6 m)    GEN- The patient is not in any distress, alert and oriented x 3 today.   Head- normocephalic, atraumatic Eyes-  Sclera clear, conjunctiva pink Ears- hearing intact Oropharynx- clear Neck- supple, no JVP Lymph- no cervical lymphadenopathy Lungs- Clear to ausculation bilaterally, normal work of breathing Heart-irregular rate and rhythm, no murmurs, rubs or gallops, PMI not laterally displaced GI- soft, NT, ND, + BS Extremities- no clubbing, cyanosis, or edema MS- no significant deformity or atrophy Skin- no rash or lesion Psych- euthymic mood, full affect Neuro- strength and sensation are intact  EKG-  afib at 121 bpm, qrs int 84 ms, qtc 502 ms Epic records reviewed PFT's-Conclusions: The diffusion defect, increased FEV1/FVC ratio and reduced FVC suggest an early interstitial process. In view of the  severity of the diffusion defect, studies with exercise would be helpful to evaluate the presence of hypoxemia. Amiodarone stopped May 2018.  Assessment and Plan:  1. Persistent afib Has returned to afib since amiodarone was stopped Now with RVR, refractory to rate control  Increase Cardizem  to 240  mg am and continue 120 mg pm Metoprolol 100 mg bid  2. Chronic diastolic HF Fluid status stable Continue lasix 80 mg qd  If not well rate controlled on next visit, will refer to EP for possible AV ablation/PPM  Butch Penny C. Lucerito Rosinski, [facility] [address] [place], [place] [address] [phone]

## 2017-07-10 ENCOUNTER — Other Ambulatory Visit (HOSPITAL_COMMUNITY): Payer: Self-pay | Admitting: Nurse Practitioner

## 2017-07-15 ENCOUNTER — Encounter (HOSPITAL_COMMUNITY): Payer: Self-pay | Admitting: Nurse Practitioner

## 2017-07-15 ENCOUNTER — Ambulatory Visit (HOSPITAL_COMMUNITY)
Admission: RE | Admit: 2017-07-15 | Discharge: 2017-07-15 | Disposition: A | Discharge status: Home / Self Care | Payer: Medicare Other | Source: Ambulatory Visit | Attending: Nurse Practitioner | Admitting: Nurse Practitioner

## 2017-07-15 ENCOUNTER — Telehealth: Payer: Self-pay | Admitting: Pharmacist

## 2017-07-15 VITALS — BP 122/74 | HR 92 | Ht 63.0 in | Wt 159.0 lb

## 2017-07-15 DIAGNOSIS — I481 Persistent atrial fibrillation: Secondary | ICD-10-CM | POA: Insufficient documentation

## 2017-07-15 DIAGNOSIS — F419 Anxiety disorder, unspecified: Secondary | ICD-10-CM | POA: Diagnosis not present

## 2017-07-15 DIAGNOSIS — Z79899 Other long term (current) drug therapy: Secondary | ICD-10-CM | POA: Insufficient documentation

## 2017-07-15 DIAGNOSIS — Z79891 Long term (current) use of opiate analgesic: Secondary | ICD-10-CM | POA: Insufficient documentation

## 2017-07-15 DIAGNOSIS — Z882 Allergy status to sulfonamides status: Secondary | ICD-10-CM | POA: Diagnosis not present

## 2017-07-15 DIAGNOSIS — Z7984 Long term (current) use of oral hypoglycemic drugs: Secondary | ICD-10-CM | POA: Insufficient documentation

## 2017-07-15 DIAGNOSIS — E785 Hyperlipidemia, unspecified: Secondary | ICD-10-CM | POA: Diagnosis not present

## 2017-07-15 DIAGNOSIS — Z8249 Family history of ischemic heart disease and other diseases of the circulatory system: Secondary | ICD-10-CM | POA: Diagnosis not present

## 2017-07-15 DIAGNOSIS — I110 Hypertensive heart disease with heart failure: Secondary | ICD-10-CM | POA: Insufficient documentation

## 2017-07-15 DIAGNOSIS — E119 Type 2 diabetes mellitus without complications: Secondary | ICD-10-CM | POA: Insufficient documentation

## 2017-07-15 DIAGNOSIS — Z886 Allergy status to analgesic agent status: Secondary | ICD-10-CM | POA: Insufficient documentation

## 2017-07-15 DIAGNOSIS — I5032 Chronic diastolic (congestive) heart failure: Secondary | ICD-10-CM | POA: Diagnosis not present

## 2017-07-15 DIAGNOSIS — Z9889 Other specified postprocedural states: Secondary | ICD-10-CM | POA: Insufficient documentation

## 2017-07-15 DIAGNOSIS — I4891 Unspecified atrial fibrillation: Secondary | ICD-10-CM

## 2017-07-15 DIAGNOSIS — Z7901 Long term (current) use of anticoagulants: Secondary | ICD-10-CM | POA: Diagnosis not present

## 2017-07-15 DIAGNOSIS — E039 Hypothyroidism, unspecified: Secondary | ICD-10-CM | POA: Insufficient documentation

## 2017-07-15 DIAGNOSIS — Z833 Family history of diabetes mellitus: Secondary | ICD-10-CM | POA: Insufficient documentation

## 2017-07-15 DIAGNOSIS — Z888 Allergy status to other drugs, medicaments and biological substances status: Secondary | ICD-10-CM | POA: Diagnosis not present

## 2017-07-15 MED ORDER — DILTIAZEM HCL ER COATED BEADS 120 MG PO CP24: 120.0000 mg | ORAL_CAPSULE | Freq: Two times a day (BID) | ORAL | 6 refills | Status: DC

## 2017-07-15 NOTE — Patient Instructions (Signed)
Decrease cardizem (Diltiazem) 120mg  twice a day  Increase lasix to 80mg  in the morning and 40mg  in the afternoon today, Tuesday, and Wednesday - then go back to 80mg  a day.

## 2017-07-15 NOTE — Telephone Encounter (Signed)
Medication list reviewed in anticipation of upcoming Tikosyn initiation. Patient is not taking any contraindicated or QTc prolonging medications, except albuterol PRN. Last dose of amiodarone in May 2018 per record.   Patient is anticoagulated on Eliquis on the appropriate dose. Please ensure that patient has not missed any anticoagulation doses in the 3 weeks prior to Tikosyn initiation.   Patient will need to be counseled to avoid use of Benadryl while on Tikosyn and in the 2-3 days prior to Tikosyn initiation.

## 2017-07-15 NOTE — Progress Notes (Addendum)
Patient ID: Ann Fletcher, female   DOB: Jul 23, 1932, 84 y.o.   MRN: [id number]      Primary Care Physician: [name], MD Referring Physician: Melina Copa, PA-C Cardiologist: Dr. Eulah [name] is a 84 y.o. female with a h/o HTN, DM, HLD, lower extremity edema, hypothyroidism, paroxysmal afib with /acute diastolic CHF who presents for f/u in the afib clinic. She has h/o right carotid bruit with prior carotid US showing mild plaque only bilaterally. She had nuclear stress test in December 2015 which was negative with EF 77%. She was admitted to [facility] 7/7-7/15 with AF RVR. She had recently been diagnosed by PCP the week prior to admission after presenting with SOB. She was placed on Toprol and Pradaxa. Unfortunately, despite good outpatient effort, patient's heart rate continued to be elevated. She was seen by cardiology service and was admitted for planned TEE/DCCV. TSH and troponins were normal. Echocardiogram showed EF 50-55%, mild MR, severe TR, moderately dilated left atrium. On exam, she appeared fluid overloaded and was aggressively diuresed with IV diuresis. Her diltiazem was changed to IV form to allow for easier titration which was eventually transition to 240mg  daily. She was continued on 100mg  daily Toprol XL. She underwent TEE cardioversion on 10/08/2014 and received 3 synchronous shocks which were unsuccessful in converting her. Post procedure, she was started on IV amiodarone with the intention of repeat DC cardioversion after loading. After 7 days of loading with amiodarone 400 mg twice a day, she underwent repeat DC cardioversion on 10/15/2014 under propofol. She was successfully converted from atrial fibrillation to atrial tachycardia with PACs after 3 attempts. It appears the last note during her hospitalization indicates she was back in atrial fib but with lower rate prior to discharge. Amiodarone was decreased to 200mg  BID with plans to reduce further to 200mg  daily at  1 month (~11/15/14). Discharge weight 171lbs (diuresed >7L during her admission). Of note CBC showed Hgb 11.0, microcytic with MCV 77.5. No recent LFTs.  When seen  by [name], PA, 7/29, she was feeling well. She was still in afib, HR around 100. She denied any CP, SOB, palpitations, dizziness or syncope. Her LEE has been minimal. Baseline weight was stable.  She denied LEE, orthopnea, PND. She is following a low-salt diet.   When initially seen in [facility], 11/05/14,, she was still  in afib, HR around 100. She denies any CP, SOB, palpitations, dizziness or syncope. Her LEE is minimal. Baseline weight is hovering around 168lbs which she says is where she has been since discharge. Bmet was repeated and showed increase in creat and bun. She was asked to reduce lasix to 1/2 tab of 80 mg lasix daily. She denies LEE, orthopnea, PND.  Denied any bleeding. I discussed with Dr. [name] and he suggested since she has been on amiodarone now x one month that it would be reasonable to try one more DCCV. If she does not stay in SR, then rate control with low dose amiodarone may be indicated.  She was scheduled for DCCV on 8/15 and did convert to SR. However, unfortunately,  did have ERAF. She stated that she had n/v on Thursday,denied blood in the vomitus and denies blood in stool. Now no further vomiting but states her stomach stays upset after taking pradaxa and her appetitive has not been as good. Switched to Eliquis with better tolerance. .Also reduced amiodarone  to 200 mg  Dr. [name] suggested that she may need low dose  amiodarone to help with rate control. She does not have complaints of palpitations, weight is stable, down 2 lbs from last visit.  She was seen by [name] 8/30 and found to be in a junctional rhythm. Amiodarone dose was decreased to 100 mg daily and cardizem dose was cut in half. She was is in sinus brady with first degree AV block. She feels well, no further stomach complaints. Weight is  stable.  She returns 01/18/15 feeling well. Weight is up per pt(2 lbs) due to appetitive being good. Ankles are swollen and we discussed that she could increase lasix to a full tablet(80) mg if needed x 3 days only then return to 1/2 pill a day. In SR, has not noticed any afib. She is being compliant with blood thinner.  F/u afib clinic 10/20/15 and has no complaints. She is staying in [place] is stable. No unusual shortness of breath. Being compliant  with eliquis, no bleeding issues. Metformin bid made her nauseated so she takes only once a day. Her husband has had Bypass surgery since she was seen here last and did well.  Pt called to office and asked for appointment for being tired and not having much stamina. She thought she may be in afib. EKG today shows SR with first degree av block. Pr int 272 ms, qrs int 96 ms, qtc 472 ms.  She states that she has to stit down and rest after she irons a little bit. Her husband states that she cant walk a half of a block before she has to stop due to being tired and short of breath. She has been told she is anemic in the past.PFT's ordered.  F/u in the afib clinc 07/18/16. On last visit labs were drawn and showed mild increase in her anemia. She denies any visible blood in stool She is c/o fatigue and shortness of breath. Her results of PFT's showed possible interstitial disease and she is asked today to see Pulmonology for evaluation of results and if she can continue on amiodarone. She has been staying in [place].  F/u in afib clinic, 01/15/17, since seen last, she did see pulmonary and amiodarone was stopped early May due to mild intestinal changes. She is in afib with HR at 113 bpm.  No complaints of shortness of breath. No LLE. She now has a caregiver that comes into the house M-F. She checks her BP/HR but she cannot remember her numbers, she states, " she says its good." Continues on Eliquis  without any issues with bleeding. Still appropriately dosed at 5 mg bid.  Weight is good at 155 lb, on office visit in May weight was 165 lb. Unfortunately, pt and husband just got back form [place], her son died from [place].  Return to afib clinic, 06/11/17. On last visit, caregiver called with HR/BP readings, and I added 120 Cardizem to am, 180 mg Cardizem. She was seen by [name] 2 weeks ago with RVR. He increased her BB and referred back here. On review of her meds, I think pt got confused with increase of cardizem on my last visit, dropped the 180 mg am dose and is just taking the pm 120 mg dose. Otherwise, feels well and has no complaints. Weight is stable and no LLE.   F/u in clinic, 06/21/17, for evaluation of rate control with increase of Cardizem at last visit. Her HR is better controlled around 110 but not optimal. Overall, she feels about the same. Gets intermittent nausea when  she takes her pills. Is now c/o of increasing shortness of breath.   F/u in afib clinic for evaluation of rate control.Despite increase in BB, she is still with RVR. States that she does not feel as well today, yesterday was at her normal. No LLE noted. Orthostatics checked and she is not orthostatic.  F/u in afib clinic after increase of Cardizem to 240 mg in am and 120 mg in the PM. Her rate is better at 92 bpm however, her weight is up 4 lbs with more notable ankle edema and chronic shortness of breath. We discussed that she is is proving very difficult to rate control without side effects of drugs. She had to be taken off amiodarone due to lung changes. Discussed options, AV nodal ablation with PPM or Tikosyn which she may qualify for pt assistance, otherwise may be too expensive for pt, renal function may only allow for 250 mg bid, plus qtc 460 ms in SR.  Today, she denies symptoms of palpitations, chest pain,  orthopnea, PND,  Positive for fatigue and shortness of breath, LLE and weight gain.  No dizziness, presyncope, syncope, or neurologic sequela.  Past Medical History:  Diagnosis Date   . Anxiety   . Arthritis   . Chronic diastolic CHF (congestive heart failure) ([place])    a. Acute exacerbation occurred in setting of AF.  . Diabetes mellitus without complication ([place])    type 2  . H/O bladder infections   . Hyperlipidemia   . Hypertension   . Hypothyroidism   . Lower extremity edema   . Osteoporosis   . Persistent atrial fibrillation ([place])    a. diagnosed in 10/2014 by PCP, underwent failed TEE DCCV on 10/08/14, loaded with amio, successfully DCCV on 10/15/14.   Past Surgical History:  Procedure Laterality Date  . BACK SURGERY  2001, 2003, 2006   x 3  . CARDIOVERSION N/A 10/08/2014   Procedure: CARDIOVERSION;  Surgeon: [name], MD;  Location: [place];  Service: Cardiovascular;  Laterality: N/A;  . CARDIOVERSION N/A 10/15/2014   Procedure: CARDIOVERSION;  Surgeon: [name], MD;  Location: [facility] ENDOSCOPY;  Service: Cardiovascular;  Laterality: N/A;  . CARDIOVERSION N/A 11/15/2014   Procedure: CARDIOVERSION;  Surgeon: [name], MD;  Location: [place];  Service: Cardiovascular;  Laterality: N/A;  . 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 TEE WITHOUT CARDIOVERSION N/A 10/08/2014   Procedure: TRANSESOPHAGEAL ECHOCARDIOGRAM (TEE);  Surgeon: [name], MD;  Location: [place];  Service: Cardiovascular;  Laterality: N/A;  . TUBAL LIGATION      Current Outpatient Medications  Medication Sig Dispense Refill  . albuterol (PROVENTIL HFA;VENTOLIN HFA) 108 (90 BASE) MCG/ACT inhaler Inhale 2 puffs into the lungs 3 (three) times daily as needed for wheezing.    [name] atorvastatin (LIPITOR) 40 MG tablet Take 40 mg by mouth daily.    . Calcium Carbonate-Vitamin D (OSCAL 500/200 D-3 PO) Take 1 tablet by mouth 2 (two) times daily.    [name] diltiazem (CARDIZEM CD) 120 MG 24 hr capsule Take 1 capsule (120 mg total) by mouth 2 (two) times daily. 90 capsule 6  . ELIQUIS 5 MG TABS tablet TAKE ONE TABLET TWICE DAILY 60 tablet 6  . fluticasone (FLONASE) 50 MCG/ACT nasal  spray Place 1 spray into both nostrils as needed.    . furosemide (LASIX) 80 MG tablet TAKE ONE TABLET EACH DAY 30 tablet 6  . levothyroxine (SYNTHROID, LEVOTHROID) 100 MCG tablet Take 1 tablet by mouth daily.    [name]  LORazepam (ATIVAN) 1 MG tablet Take 1 mg by mouth at bedtime.     . metFORMIN (GLUCOPHAGE) 500 MG tablet Take 500 mg by mouth 2 (two) times daily with a meal.     . metoprolol succinate (TOPROL-XL) 100 MG 24 hr tablet Take 1 tablet (100 mg total) by mouth 2 (two) times daily. 180 tablet 1  . Omega-3 Fatty Acids (FISH OIL ADULT GUMMIES) 113.5 MG CHEW Chew by mouth.    . potassium chloride SA (K-DUR,KLOR-CON) 20 MEQ tablet Take 1 tablet (20 mEq total) by mouth 2 (two) times daily. 60 tablet 4  . traMADol (ULTRAM) 50 MG tablet Take by mouth every 6 (six) hours as needed.    . vitamin E (VITAMIN E) 400 UNIT capsule Take 400 Units by mouth daily.     No current facility-administered medications for this encounter.     Allergies  Allergen Reactions  . Pradaxa [Dabigatran Etexilate Mesylate] Nausea And Vomiting  . Nsaids Other (See Comments)    Stomach cramps  . Sulfa Antibiotics Nausea And Vomiting and Other (See Comments)    GI upset  . Betadine [Povidone Iodine] Itching    Social History   Socioeconomic History  . Marital status: Married    Spouse name: Not on file  . Number of children: Not on file  . Years of education: Not on file  . Highest education level: Not on file  Occupational History  . Not on file  Social Needs  . Financial resource strain: Not on file  . Food insecurity:    Worry: Not on file    Inability: Not on file  . Transportation needs:    Medical: Not on file    Non-medical: Not on file  Tobacco Use  . Smoking status: Never Smoker  . Smokeless tobacco: Never Used  Substance and Sexual Activity  . Alcohol use: No  . Drug use: No  . Sexual activity: Yes    Birth control/protection: Surgical  Lifestyle  . Physical activity:    Days per week:  Not on file    Minutes per session: Not on file  . Stress: Not on file  Relationships  . Social connections:    Talks on phone: Not on file    Gets together: Not on file    Attends religious service: Not on file    Active member of club or organization: Not on file    Attends meetings of clubs or organizations: Not on file    Relationship status: Not on file  . Intimate partner violence:    Fear of current or ex partner: Not on file    Emotionally abused: Not on file    Physically abused: Not on file    Forced sexual activity: Not on file  Other Topics Concern  . Not on file  Social History Narrative  . Not on file    Family History  Problem Relation Age of Onset  . Pneumonia Mother   . Heart attack Father   . Diabetes Sister     ROS- All systems are reviewed and negative except as per the HPI above  Physical Exam: Vitals:   07/15/17 1105  BP: 122/74  Pulse: 92  SpO2: 94%  Weight: 159 lb (72.1 kg)  Height: 5\' 3"  (1.6 m)    GEN- The patient is not in any distress, alert and oriented x 3 today.   Head- normocephalic, atraumatic Eyes-  Sclera clear, conjunctiva pink Ears- hearing intact Oropharynx- clear Neck-  supple, no JVP Lymph- no cervical lymphadenopathy Lungs- Clear to ausculation bilaterally, normal work of breathing Heart-irregular rate and rhythm, no murmurs, rubs or gallops, PMI not laterally displaced GI- soft, NT, ND, + BS Extremities- no clubbing, cyanosis, or edema MS- no significant deformity or atrophy Skin- no rash or lesion Psych- euthymic mood, full affect Neuro- strength and sensation are intact  EKG-  afib at 91  bpm, qrs int 86 ms, qtc 469 ms Epic records reviewed PFT's-Conclusions: The diffusion defect, increased FEV1/FVC ratio and reduced FVC suggest an early interstitial process. In view of the severity of the diffusion defect, studies with exercise would be helpful to evaluate the presence of hypoxemia. Amiodarone stopped May  2018.  CT of chest- 08/10/16-IMPRESSION: 1. The appearance of the lungs is compatible with interstitial lung disease. The spectrum of findings at this time is nonspecific. Overall, this is favored to reflect nonspecific interstitial pneumonia (NSIP). However, given the basal predominance of the findings, the possibility of early usual interstitial pneumonia (UIP) is not entirely excluded. Accordingly, follow-up high-resolution chest CT is recommended in 12 months to assess for temporal changes in the appearance of the lung parenchyma. 2. Dilatation of the pulmonic trunk (3.9 cm in diameter), suggestive of associated pulmonary arterial hypertension. 3. Aortic atherosclerosis, in addition to left main and 3 vessel coronary artery disease. 4. Morphologic changes in the liver suggestive of underlying cirrhosis, as above. Assessment and Plan:  1. Persistent symptomatic afib Has returned to afib since amiodarone was stopped  short of one year ago Chronic shortness of breath Now with RVR, refractory to rate control  Decrease  Cardizem back to 120 mg am and continue 120 mg pm Continue Metoprolol 100 mg bid  2. Chronic diastolic HF Weight up 4 lbs with the addition of extra cardizem Go back to previous dose of 120 mg bid Continue lasix 80 mg qd, but for 3 days only add 1/2 tab in the pm's  Will refer pt to Dr. Lovena Le for pt to be considered to AV nodal ablation with PPM, vrs Tikosyn but there are some concerns for Tikosyn, price, renal function, qtc at baseline.   Geroge Baseman Deagen Krass, [facility] [address] [place], [place] [address] [phone]

## 2017-07-17 ENCOUNTER — Ambulatory Visit (HOSPITAL_COMMUNITY): Payer: Self-pay | Admitting: Nurse Practitioner

## 2017-07-17 DIAGNOSIS — S82846A Nondisplaced bimalleolar fracture of unspecified lower leg, initial encounter for closed fracture: Secondary | ICD-10-CM | POA: Diagnosis not present

## 2017-07-19 NOTE — Addendum Note (Signed)
Encounter addended by: Sherran Needs, NP on: 07/19/2017 4:08 PM  Actions taken: Sign clinical note

## 2017-07-30 ENCOUNTER — Ambulatory Visit (HOSPITAL_COMMUNITY): Payer: Self-pay | Admitting: Nurse Practitioner

## 2017-08-06 ENCOUNTER — Encounter: Payer: Self-pay | Admitting: Internal Medicine

## 2017-08-06 ENCOUNTER — Ambulatory Visit (INDEPENDENT_AMBULATORY_CARE_PROVIDER_SITE_OTHER): Payer: Medicare Other | Admitting: Internal Medicine

## 2017-08-06 VITALS — BP 136/78 | HR 125 | Ht 63.0 in | Wt 158.0 lb

## 2017-08-06 DIAGNOSIS — I5032 Chronic diastolic (congestive) heart failure: Secondary | ICD-10-CM | POA: Diagnosis not present

## 2017-08-06 DIAGNOSIS — I481 Persistent atrial fibrillation: Secondary | ICD-10-CM | POA: Diagnosis not present

## 2017-08-06 DIAGNOSIS — I4819 Other persistent atrial fibrillation: Secondary | ICD-10-CM

## 2017-08-06 NOTE — H&P (View-Only) (Signed)
HPI Ann Fletcher is referred today by Roderic Palau NP-C to consider AV node ablation and PPM. She also sees [name]. She is a pleasant 84 yo woman with persistent atrial fib with a RVR, chronic class 3 diastolic heart failure based on prior echo 3 years ago with a preserved EF. The patient has not had syncope but she gets sob easily. She denies anginal symptoms. No edema presently. She is pending a cardiac CT. She has been on increasingly high doses of cardizem and toprol but despite this her rates are high. In our office, resting she was 125/min. Allergies  Allergen Reactions  . Pradaxa [Dabigatran Etexilate Mesylate] Nausea And Vomiting  . Nsaids Other (See Comments)    Stomach cramps  . Sulfa Antibiotics Nausea And Vomiting and Other (See Comments)    GI upset  . Betadine [Povidone Iodine] Itching     Current Outpatient Medications  Medication Sig Dispense Refill  . albuterol (PROVENTIL HFA;VENTOLIN HFA) 108 (90 BASE) MCG/ACT inhaler Inhale 2 puffs into the lungs 3 (three) times daily as needed for wheezing.    [name] atorvastatin (LIPITOR) 40 MG tablet Take 40 mg by mouth daily.    . Calcium Carbonate-Vitamin D (OSCAL 500/200 D-3 PO) Take 1 tablet by mouth 2 (two) times daily.    [name] diltiazem (CARDIZEM CD) 120 MG 24 hr capsule Take 1 capsule (120 mg total) by mouth 2 (two) times daily. 90 capsule 6  . ELIQUIS 5 MG TABS tablet TAKE ONE TABLET TWICE DAILY 60 tablet 6  . fluticasone (FLONASE) 50 MCG/ACT nasal spray Place 1 spray into both nostrils as needed.    . furosemide (LASIX) 80 MG tablet TAKE ONE TABLET EACH DAY 30 tablet 6  . levothyroxine (SYNTHROID, LEVOTHROID) 100 MCG tablet Take 1 tablet by mouth daily.    [name] LORazepam (ATIVAN) 1 MG tablet Take 1 mg by mouth at bedtime.     . metFORMIN (GLUCOPHAGE) 500 MG tablet Take 500 mg by mouth 2 (two) times daily with a meal.     . metoprolol succinate (TOPROL-XL) 100 MG 24 hr tablet Take 1 tablet (100 mg total) by mouth 2 (two) times  daily. 180 tablet 1  . Omega-3 Fatty Acids (FISH OIL ADULT GUMMIES) 113.5 MG CHEW Chew by mouth.    . potassium chloride SA (K-DUR,KLOR-CON) 20 MEQ tablet Take 1 tablet (20 mEq total) by mouth 2 (two) times daily. 60 tablet 4  . traMADol (ULTRAM) 50 MG tablet Take by mouth every 6 (six) hours as needed.    . vitamin E (VITAMIN E) 400 UNIT capsule Take 400 Units by mouth daily.     No current facility-administered medications for this visit.      Past Medical History:  Diagnosis Date  . Anxiety   . Arthritis   . Chronic diastolic CHF (congestive heart failure) ([place])    a. Acute exacerbation occurred in setting of AF.  . Diabetes mellitus without complication ([place])    type 2  . H/O bladder infections   . Hyperlipidemia   . Hypertension   . Hypothyroidism   . Lower extremity edema   . Osteoporosis   . Persistent atrial fibrillation ([place])    a. diagnosed in 10/2014 by PCP, underwent failed TEE DCCV on 10/08/14, loaded with amio, successfully DCCV on 10/15/14.    ROS:   All systems reviewed and negative except as noted in the HPI.   Past Surgical History:  Procedure Laterality Date  .  BACK SURGERY  2001, 2003, 2006   x 3  . CARDIOVERSION N/A 10/08/2014   Procedure: CARDIOVERSION;  Surgeon: [name], MD;  Location: [place];  Service: Cardiovascular;  Laterality: N/A;  . CARDIOVERSION N/A 10/15/2014   Procedure: CARDIOVERSION;  Surgeon: [name], MD;  Location: [facility] ENDOSCOPY;  Service: Cardiovascular;  Laterality: N/A;  . CARDIOVERSION N/A 11/15/2014   Procedure: CARDIOVERSION;  Surgeon: [name], MD;  Location: [place];  Service: Cardiovascular;  Laterality: N/A;  . 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 TEE WITHOUT CARDIOVERSION N/A 10/08/2014   Procedure: TRANSESOPHAGEAL ECHOCARDIOGRAM (TEE);  Surgeon: [name], MD;  Location[facility] ENDOSCOPY;  Service: Cardiovascular;  Laterality: N/A;  . TUBAL LIGATION       Family History  Problem Relation Age  of Onset  . Pneumonia Mother   . Heart attack Father   . Diabetes Sister      Social History   Socioeconomic History  . Marital status: Married    Spouse name: Not on file  . Number of children: Not on file  . Years of education: Not on file  . Highest education level: Not on file  Occupational History  . Not on file  Social Needs  . Financial resource strain: Not on file  . Food insecurity:    Worry: Not on file    Inability: Not on file  . Transportation needs:    Medical: Not on file    Non-medical: Not on file  Tobacco Use  . Smoking status: Never Smoker  . Smokeless tobacco: Never Used  Substance and Sexual Activity  . Alcohol use: No  . Drug use: No  . Sexual activity: Yes    Birth control/protection: Surgical  Lifestyle  . Physical activity:    Days per week: Not on file    Minutes per session: Not on file  . Stress: Not on file  Relationships  . Social connections:    Talks on phone: Not on file    Gets together: Not on file    Attends religious service: Not on file    Active member of club or organization: Not on file    Attends meetings of clubs or organizations: Not on file    Relationship status: Not on file  . Intimate partner violence:    Fear of current or ex partner: Not on file    Emotionally abused: Not on file    Physically abused: Not on file    Forced sexual activity: Not on file  Other Topics Concern  . Not on file  Social History Narrative  . Not on file     BP 136/78   Pulse (!) 125   Ht 5\' 3"  (1.6 m)   Wt 158 lb (71.7 kg)   BMI 27.99 kg/m   Physical Exam:  stable but chronically ill appearing 84 yo woman, NAD HEENT: Unremarkable Neck:  6 cm JVD, no thyromegally Lymphatics:  No adenopathy Back:  No CVA tenderness Lungs:  Clear with no wheezes HEART:  IRegular tachy rhythm, no murmurs, no rubs, no clicks Abd:  soft, positive bowel sounds, no organomegally, no rebound, no guarding Ext:  2 plus pulses, no edema, no  cyanosis, no clubbing Skin:  No rashes no nodules Neuro:  CN II through XII intact, motor grossly intact  EKG - atrial fib with a RVR   Assess/Plan: 1. Uncontrolled atrial fib - I have discussed the treatment options with the patient. The risks/benefits/goals/expectations of  AV node ablation and PPM insertion were reviewed and she wishes to proceed. 2. HTN - I would anticipate adjusting her meds after her AV node ablation and PPM. She will maintain a low sodium diet. 3. Dyslipidemia - she will continue her statin therapy with lipitor.  Mikle Bosworth.D.

## 2017-08-06 NOTE — Progress Notes (Signed)
HPI Ann Fletcher is referred today by Roderic Palau NP-C to consider AV node ablation and PPM. She also sees [name]. She is a pleasant 84 yo woman with persistent atrial fib with a RVR, chronic class 3 diastolic heart failure based on prior echo 3 years ago with a preserved EF. The patient has not had syncope but she gets sob easily. She denies anginal symptoms. No edema presently. She is pending a cardiac CT. She has been on increasingly high doses of cardizem and toprol but despite this her rates are high. In our office, resting she was 125/min. Allergies  Allergen Reactions  . Pradaxa [Dabigatran Etexilate Mesylate] Nausea And Vomiting  . Nsaids Other (See Comments)    Stomach cramps  . Sulfa Antibiotics Nausea And Vomiting and Other (See Comments)    GI upset  . Betadine [Povidone Iodine] Itching     Current Outpatient Medications  Medication Sig Dispense Refill  . albuterol (PROVENTIL HFA;VENTOLIN HFA) 108 (90 BASE) MCG/ACT inhaler Inhale 2 puffs into the lungs 3 (three) times daily as needed for wheezing.    [name] atorvastatin (LIPITOR) 40 MG tablet Take 40 mg by mouth daily.    . Calcium Carbonate-Vitamin D (OSCAL 500/200 D-3 PO) Take 1 tablet by mouth 2 (two) times daily.    [name] diltiazem (CARDIZEM CD) 120 MG 24 hr capsule Take 1 capsule (120 mg total) by mouth 2 (two) times daily. 90 capsule 6  . ELIQUIS 5 MG TABS tablet TAKE ONE TABLET TWICE DAILY 60 tablet 6  . fluticasone (FLONASE) 50 MCG/ACT nasal spray Place 1 spray into both nostrils as needed.    . furosemide (LASIX) 80 MG tablet TAKE ONE TABLET EACH DAY 30 tablet 6  . levothyroxine (SYNTHROID, LEVOTHROID) 100 MCG tablet Take 1 tablet by mouth daily.    [name] LORazepam (ATIVAN) 1 MG tablet Take 1 mg by mouth at bedtime.     . metFORMIN (GLUCOPHAGE) 500 MG tablet Take 500 mg by mouth 2 (two) times daily with a meal.     . metoprolol succinate (TOPROL-XL) 100 MG 24 hr tablet Take 1 tablet (100 mg total) by mouth 2 (two) times  daily. 180 tablet 1  . Omega-3 Fatty Acids (FISH OIL ADULT GUMMIES) 113.5 MG CHEW Chew by mouth.    . potassium chloride SA (K-DUR,KLOR-CON) 20 MEQ tablet Take 1 tablet (20 mEq total) by mouth 2 (two) times daily. 60 tablet 4  . traMADol (ULTRAM) 50 MG tablet Take by mouth every 6 (six) hours as needed.    . vitamin E (VITAMIN E) 400 UNIT capsule Take 400 Units by mouth daily.     No current facility-administered medications for this visit.      Past Medical History:  Diagnosis Date  . Anxiety   . Arthritis   . Chronic diastolic CHF (congestive heart failure) ([place])    a. Acute exacerbation occurred in setting of AF.  . Diabetes mellitus without complication ([place])    type 2  . H/O bladder infections   . Hyperlipidemia   . Hypertension   . Hypothyroidism   . Lower extremity edema   . Osteoporosis   . Persistent atrial fibrillation ([place])    a. diagnosed in 10/2014 by PCP, underwent failed TEE DCCV on 10/08/14, loaded with amio, successfully DCCV on 10/15/14.    ROS:   All systems reviewed and negative except as noted in the HPI.   Past Surgical History:  Procedure Laterality Date  .  BACK SURGERY  2001, 2003, 2006   x 3  . CARDIOVERSION N/A 10/08/2014   Procedure: CARDIOVERSION;  Surgeon: [name], MD;  Location: [place];  Service: Cardiovascular;  Laterality: N/A;  . CARDIOVERSION N/A 10/15/2014   Procedure: CARDIOVERSION;  Surgeon: [name], MD;  Location: [facility] ENDOSCOPY;  Service: Cardiovascular;  Laterality: N/A;  . CARDIOVERSION N/A 11/15/2014   Procedure: CARDIOVERSION;  Surgeon: [name], MD;  Location: [place];  Service: Cardiovascular;  Laterality: N/A;  . 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 TEE WITHOUT CARDIOVERSION N/A 10/08/2014   Procedure: TRANSESOPHAGEAL ECHOCARDIOGRAM (TEE);  Surgeon: [name], MD;  Location: [facility] ENDOSCOPY;  Service: Cardiovascular;  Laterality: N/A;  . TUBAL LIGATION       Family History  Problem Relation Age  of Onset  . Pneumonia Mother   . Heart attack Father   . Diabetes Sister      Social History   Socioeconomic History  . Marital status: Married    Spouse name: Not on file  . Number of children: Not on file  . Years of education: Not on file  . Highest education level: Not on file  Occupational History  . Not on file  Social Needs  . Financial resource strain: Not on file  . Food insecurity:    Worry: Not on file    Inability: Not on file  . Transportation needs:    Medical: Not on file    Non-medical: Not on file  Tobacco Use  . Smoking status: Never Smoker  . Smokeless tobacco: Never Used  Substance and Sexual Activity  . Alcohol use: No  . Drug use: No  . Sexual activity: Yes    Birth control/protection: Surgical  Lifestyle  . Physical activity:    Days per week: Not on file    Minutes per session: Not on file  . Stress: Not on file  Relationships  . Social connections:    Talks on phone: Not on file    Gets together: Not on file    Attends religious service: Not on file    Active member of club or organization: Not on file    Attends meetings of clubs or organizations: Not on file    Relationship status: Not on file  . Intimate partner violence:    Fear of current or ex partner: Not on file    Emotionally abused: Not on file    Physically abused: Not on file    Forced sexual activity: Not on file  Other Topics Concern  . Not on file  Social History Narrative  . Not on file     BP 136/78   Pulse (!) 125   Ht 5\' 3"  (1.6 m)   Wt 158 lb (71.7 kg)   BMI 27.99 kg/m   Physical Exam:  stable but chronically ill appearing 84 yo woman, NAD HEENT: Unremarkable Neck:  6 cm JVD, no thyromegally Lymphatics:  No adenopathy Back:  No CVA tenderness Lungs:  Clear with no wheezes HEART:  IRegular tachy rhythm, no murmurs, no rubs, no clicks Abd:  soft, positive bowel sounds, no organomegally, no rebound, no guarding Ext:  2 plus pulses, no edema, no  cyanosis, no clubbing Skin:  No rashes no nodules Neuro:  CN II through XII intact, motor grossly intact  EKG - atrial fib with a RVR   Assess/Plan: 1. Uncontrolled atrial fib - I have discussed the treatment options with the patient. The risks/benefits/goals/expectations of  AV node ablation and PPM insertion were reviewed and she wishes to proceed. 2. HTN - I would anticipate adjusting her meds after her AV node ablation and PPM. She will maintain a low sodium diet. 3. Dyslipidemia - she will continue her statin therapy with lipitor.  Mikle Bosworth.D.

## 2017-08-06 NOTE — Patient Instructions (Addendum)
Medication Instructions:  Your physician recommends that you continue on your current medications as directed. Please refer to the Current Medication list given to you today.  Labwork: You will get lab work on the same day you come in for your ECHO:  BMP and CBC.  Please schedule for labs same day as ECHO.  Testing/Procedures: Your physician has recommended that you have an ablation. Catheter ablation is a medical procedure used to treat some cardiac arrhythmias (irregular heartbeats). During catheter ablation, a long, thin, flexible tube is put into a blood vessel in your groin (upper thigh), or neck. This tube is called an ablation catheter. It is then guided to your heart through the blood vessel. Radio frequency waves destroy small areas of heart tissue where abnormal heartbeats may cause an arrhythmia to start. Please see the instruction sheet given to you today.  Your physician has recommended that you have a pacemaker inserted. A pacemaker is a small device that is placed under the skin of your chest or abdomen to help control abnormal heart rhythms. This device uses electrical pulses to prompt the heart to beat at a normal rate. Pacemakers are used to treat heart rhythms that are too slow. Wire (leads) are attached to the pacemaker that goes into the chambers of you heart. This is done in the hospital and usually requires and overnight stay. Please see the instruction sheet given to you today for more information.  Your physician has requested that you have an echocardiogram. Echocardiography is a painless test that uses sound waves to create images of your heart. It provides your doctor with information about the size and shape of your heart and how well your heart's chambers and valves are working. This procedure takes approximately one hour. There are no restrictions for this procedure.  Please schedule for an ECHO between May 13-17, 2019.  Follow-Up: You will follow up with device  clinic 10-14 days after your procedure for a wound check.  You will follow up with Dr. Lovena Le 91 days after your procedure.  Any Other Special Instructions Will Be Listed Below (If Applicable).  Please arrive at the [facility] main entrance of [facility] hospital at:  11:30 am on Aug 23, 2017  Use the CHG surgical scrub as directed  Do not eat or drink after midnight prior to procedure  Do NOT take your ELIQUIS the night before your procedure.  Your last dose will be Aug 22 2017 your AM DOSE.  Then HOLD. On the morning of your procedure you may take your morning medication with a sip of water EXCEPT FOR:  LASIX and [place] for one night stay  You will need someone to drive you home at discharge  If you need a refill on your cardiac medications before your next appointment, please call your pharmacy.   Pacemaker Implantation, Adult Pacemaker implantation is a procedure to place a pacemaker inside your chest. A pacemaker is a small computer that sends electrical signals to the heart and helps your heart beat normally. A pacemaker also stores information about your heart rhythms. You may need pacemaker implantation if you:  Have a slow heartbeat (bradycardia).  Faint (syncope).  Have shortness of breath (dyspnea) due to heart problems.  The pacemaker attaches to your heart through a wire, called a lead. Sometimes just one lead is needed. Other times, there will be two leads. There are two types of pacemakers:  Transvenous pacemaker. This type is placed under the skin or muscle  of your chest. The lead goes through a vein in the chest area to reach the inside of the heart.  Epicardial pacemaker. This type is placed under the skin or muscle of your chest or belly. The lead goes through your chest to the outside of the heart.  Tell a health care provider about:  Any allergies you have.  All medicines you are taking, including vitamins, herbs, eye drops, creams, and  over-the-counter medicines.  Any problems you or family members have had with anesthetic medicines.  Any blood or bone disorders you have.  Any surgeries you have had.  Any medical conditions you have.  Whether you are pregnant or may be pregnant. What are the risks? Generally, this is a safe procedure. However, problems may occur, including:  Infection.  Bleeding.  Failure of the pacemaker or the lead.  Collapse of a lung or bleeding into a lung.  Blood clot inside a blood vessel with a lead.  Damage to the heart.  Infection inside the heart (endocarditis).  Allergic reactions to medicines.  What happens before the procedure? Staying hydrated Follow instructions from your health care provider about hydration, which may include:  Up to 2 hours before the procedure - you may continue to drink clear liquids, such as water, clear fruit juice, black coffee, and plain tea.  Eating and drinking restrictions Follow instructions from your health care provider about eating and drinking, which may include:  8 hours before the procedure - stop eating heavy meals or foods such as meat, fried foods, or fatty foods.  6 hours before the procedure - stop eating light meals or foods, such as toast or cereal.  6 hours before the procedure - stop drinking milk or drinks that contain milk.  2 hours before the procedure - stop drinking clear liquids.  Medicines  Ask your health care provider about: ? Changing or stopping your regular medicines. This is especially important if you are taking diabetes medicines or blood thinners. ? Taking medicines such as aspirin and ibuprofen. These medicines can thin your blood. Do not take these medicines before your procedure if your health care provider instructs you not to.  You may be given antibiotic medicine to help prevent infection. General instructions  You will have a heart evaluation. This may include an electrocardiogram (ECG), chest  X-ray, and heart imaging (echocardiogram,  or echo) tests.  You will have blood tests.  Do not use any products that contain nicotine or tobacco, such as cigarettes and e-cigarettes. If you need help quitting, ask your health care provider.  Plan to have someone take you home from the hospital or clinic.  If you will be going home right after the procedure, plan to have someone with you for 24 hours.  Ask your health care provider how your surgical site will be marked or identified. What happens during the procedure?  To reduce your risk of infection: ? Your health care team will wash or sanitize their hands. ? Your skin will be washed with soap. ? Hair may be removed from the surgical area.  An IV tube will be inserted into one of your veins.  You will be given one or more of the following: ? A medicine to help you relax (sedative). ? A medicine to numb the area (local anesthetic). ? A medicine to make you fall asleep (general anesthetic).  If you are getting a transvenous pacemaker: ? An incision will be made in your upper chest. ? A pocket  will be made for the pacemaker. It may be placed under the skin or between layers of muscle. ? The lead will be inserted into a blood vessel that returns to the heart. ? While X-rays are taken by an imaging machine (fluoroscopy), the lead will be advanced through the vein to the inside of your heart. ? The other end of the lead will be tunneled under the skin and attached to the pacemaker.  If you are getting an epicardial pacemaker: ? An incision will be made near your ribs or breastbone (sternum) for the lead. ? The lead will be attached to the outside of your heart. ? Another incision will be made in your chest or upper belly to create a pocket for the pacemaker. ? The free end of the lead will be tunneled under the skin and attached to the pacemaker.  The transvenous or epicardial pacemaker will be tested. Imaging studies may be done to  check the lead position.  The incisions will be closed with stitches (sutures), adhesive strips, or skin glue.  Bandages (dressing) will be placed over the incisions. The procedure may vary among health care providers and hospitals. What happens after the procedure?  Your blood pressure, heart rate, breathing rate, and blood oxygen level will be monitored until the medicines you were given have worn off.  You will be given antibiotics and pain medicine.  ECG and chest x-rays will be done.  You will wear a continuous type of ECG (Holter monitor) to check your heart rhythm.  Your health care provider willprogram the pacemaker.  Do not drive for 24 hours if you received a sedative. This information is not intended to replace advice given to you by your health care provider. Make sure you discuss any questions you have with your health care provider. Document Released: 03/09/2002 Document Revised: 10/07/2015 Document Reviewed: 08/31/2015 Elsevier Interactive Patient Education  [employer].

## 2017-08-12 DIAGNOSIS — I10 Essential (primary) hypertension: Secondary | ICD-10-CM | POA: Diagnosis not present

## 2017-08-12 DIAGNOSIS — E782 Mixed hyperlipidemia: Secondary | ICD-10-CM | POA: Diagnosis not present

## 2017-08-12 DIAGNOSIS — E1121 Type 2 diabetes mellitus with diabetic nephropathy: Secondary | ICD-10-CM | POA: Diagnosis not present

## 2017-08-12 DIAGNOSIS — I482 Chronic atrial fibrillation: Secondary | ICD-10-CM | POA: Diagnosis not present

## 2017-08-16 ENCOUNTER — Ambulatory Visit (HOSPITAL_COMMUNITY): Payer: Medicare Other | Attending: Cardiovascular Disease

## 2017-08-16 ENCOUNTER — Other Ambulatory Visit: Payer: Medicare Other

## 2017-08-16 ENCOUNTER — Encounter (INDEPENDENT_AMBULATORY_CARE_PROVIDER_SITE_OTHER): Payer: Self-pay

## 2017-08-16 ENCOUNTER — Other Ambulatory Visit: Payer: Self-pay

## 2017-08-16 DIAGNOSIS — E119 Type 2 diabetes mellitus without complications: Secondary | ICD-10-CM | POA: Insufficient documentation

## 2017-08-16 DIAGNOSIS — I110 Hypertensive heart disease with heart failure: Secondary | ICD-10-CM | POA: Insufficient documentation

## 2017-08-16 DIAGNOSIS — I5032 Chronic diastolic (congestive) heart failure: Secondary | ICD-10-CM | POA: Diagnosis not present

## 2017-08-16 DIAGNOSIS — F419 Anxiety disorder, unspecified: Secondary | ICD-10-CM | POA: Insufficient documentation

## 2017-08-16 DIAGNOSIS — I481 Persistent atrial fibrillation: Secondary | ICD-10-CM

## 2017-08-16 DIAGNOSIS — I4891 Unspecified atrial fibrillation: Secondary | ICD-10-CM | POA: Insufficient documentation

## 2017-08-16 DIAGNOSIS — I071 Rheumatic tricuspid insufficiency: Secondary | ICD-10-CM | POA: Diagnosis not present

## 2017-08-16 DIAGNOSIS — I4819 Other persistent atrial fibrillation: Secondary | ICD-10-CM

## 2017-08-16 DIAGNOSIS — E039 Hypothyroidism, unspecified: Secondary | ICD-10-CM | POA: Insufficient documentation

## 2017-08-16 DIAGNOSIS — E785 Hyperlipidemia, unspecified: Secondary | ICD-10-CM | POA: Diagnosis not present

## 2017-08-17 LAB — CBC WITH DIFFERENTIAL/PLATELET
Basophils Absolute: 0.1 10*3/uL (ref 0.0–0.2)
Basos: 1 %
EOS (ABSOLUTE): 0.1 10*3/uL (ref 0.0–0.4)
Eos: 1 %
Hematocrit: 29.1 % — ABNORMAL LOW (ref 34.0–46.6)
Hemoglobin: 9.2 g/dL — ABNORMAL LOW (ref 11.1–15.9)
Immature Grans (Abs): 0 10*3/uL (ref 0.0–0.1)
Immature Granulocytes: 0 %
Lymphocytes Absolute: 2 10*3/uL (ref 0.7–3.1)
Lymphs: 25 %
MCH: 24.4 pg — ABNORMAL LOW (ref 26.6–33.0)
MCHC: 31.6 g/dL (ref 31.5–35.7)
MCV: 77 fL — ABNORMAL LOW (ref 79–97)
Monocytes Absolute: 1 10*3/uL — ABNORMAL HIGH (ref 0.1–0.9)
Monocytes: 13 %
Neutrophils Absolute: 5 10*3/uL (ref 1.4–7.0)
Neutrophils: 60 %
Platelets: 181 10*3/uL (ref 150–379)
RBC: 3.77 x10E6/uL (ref 3.77–5.28)
RDW: 16.6 % — ABNORMAL HIGH (ref 12.3–15.4)
WBC: 8.2 10*3/uL (ref 3.4–10.8)

## 2017-08-17 LAB — BASIC METABOLIC PANEL
BUN/Creatinine Ratio: 23 (ref 12–28)
BUN: 26 mg/dL (ref 8–27)
CO2: 25 mmol/L (ref 20–29)
Calcium: 10.4 mg/dL — ABNORMAL HIGH (ref 8.7–10.3)
Chloride: 100 mmol/L (ref 96–106)
Creatinine, Ser: 1.12 mg/dL — ABNORMAL HIGH (ref 0.57–1.00)
GFR calc Af Amer: 53 mL/min/{1.73_m2} — ABNORMAL LOW (ref >59)
GFR calc non Af Amer: 46 mL/min/{1.73_m2} — ABNORMAL LOW (ref >59)
Glucose: 134 mg/dL — ABNORMAL HIGH (ref 65–99)
Potassium: 4.7 mmol/L (ref 3.5–5.2)
Sodium: 141 mmol/L (ref 134–144)

## 2017-08-19 ENCOUNTER — Ambulatory Visit (INDEPENDENT_AMBULATORY_CARE_PROVIDER_SITE_OTHER)
Admission: RE | Admit: 2017-08-19 | Discharge: 2017-08-19 | Disposition: A | Discharge status: Home / Self Care | Payer: Medicare Other | Source: Ambulatory Visit | Attending: Pulmonary Disease | Admitting: Pulmonary Disease

## 2017-08-19 DIAGNOSIS — J849 Interstitial pulmonary disease, unspecified: Secondary | ICD-10-CM | POA: Diagnosis not present

## 2017-08-19 DIAGNOSIS — J90 Pleural effusion, not elsewhere classified: Secondary | ICD-10-CM | POA: Diagnosis not present

## 2017-08-23 ENCOUNTER — Ambulatory Visit (HOSPITAL_COMMUNITY)
Admission: RE | Disposition: A | Discharge status: Home / Self Care | Payer: Self-pay | Source: Ambulatory Visit | Attending: Internal Medicine

## 2017-08-23 ENCOUNTER — Ambulatory Visit (HOSPITAL_COMMUNITY)
Admission: RE | Admit: 2017-08-23 | Discharge: 2017-08-24 | Disposition: A | Discharge status: Home / Self Care | Payer: Medicare Other | Source: Ambulatory Visit | Attending: Internal Medicine | Admitting: Internal Medicine

## 2017-08-23 DIAGNOSIS — I10 Essential (primary) hypertension: Secondary | ICD-10-CM | POA: Diagnosis present

## 2017-08-23 DIAGNOSIS — Z7984 Long term (current) use of oral hypoglycemic drugs: Secondary | ICD-10-CM | POA: Diagnosis not present

## 2017-08-23 DIAGNOSIS — F419 Anxiety disorder, unspecified: Secondary | ICD-10-CM | POA: Diagnosis not present

## 2017-08-23 DIAGNOSIS — E039 Hypothyroidism, unspecified: Secondary | ICD-10-CM | POA: Diagnosis not present

## 2017-08-23 DIAGNOSIS — I442 Atrioventricular block, complete: Secondary | ICD-10-CM | POA: Diagnosis not present

## 2017-08-23 DIAGNOSIS — Z7951 Long term (current) use of inhaled steroids: Secondary | ICD-10-CM | POA: Diagnosis not present

## 2017-08-23 DIAGNOSIS — I482 Chronic atrial fibrillation: Secondary | ICD-10-CM | POA: Diagnosis not present

## 2017-08-23 DIAGNOSIS — I110 Hypertensive heart disease with heart failure: Secondary | ICD-10-CM | POA: Insufficient documentation

## 2017-08-23 DIAGNOSIS — I5032 Chronic diastolic (congestive) heart failure: Secondary | ICD-10-CM | POA: Diagnosis not present

## 2017-08-23 DIAGNOSIS — M810 Age-related osteoporosis without current pathological fracture: Secondary | ICD-10-CM | POA: Insufficient documentation

## 2017-08-23 DIAGNOSIS — M1990 Unspecified osteoarthritis, unspecified site: Secondary | ICD-10-CM | POA: Insufficient documentation

## 2017-08-23 DIAGNOSIS — Z882 Allergy status to sulfonamides status: Secondary | ICD-10-CM | POA: Insufficient documentation

## 2017-08-23 DIAGNOSIS — E119 Type 2 diabetes mellitus without complications: Secondary | ICD-10-CM | POA: Insufficient documentation

## 2017-08-23 DIAGNOSIS — I4891 Unspecified atrial fibrillation: Secondary | ICD-10-CM | POA: Diagnosis not present

## 2017-08-23 DIAGNOSIS — E785 Hyperlipidemia, unspecified: Secondary | ICD-10-CM | POA: Insufficient documentation

## 2017-08-23 DIAGNOSIS — Z9889 Other specified postprocedural states: Secondary | ICD-10-CM

## 2017-08-23 DIAGNOSIS — Z7901 Long term (current) use of anticoagulants: Secondary | ICD-10-CM | POA: Insufficient documentation

## 2017-08-23 DIAGNOSIS — Z950 Presence of cardiac pacemaker: Secondary | ICD-10-CM

## 2017-08-23 HISTORY — PX: AV NODE ABLATION: EP1193

## 2017-08-23 HISTORY — PX: PACEMAKER IMPLANT: EP1218

## 2017-08-23 HISTORY — DX: Chronic kidney disease, stage 3 unspecified: N18.30

## 2017-08-23 HISTORY — DX: Chronic kidney disease, stage 3 (moderate): N18.3

## 2017-08-23 HISTORY — DX: Other specified cardiac arrhythmias: I49.8

## 2017-08-23 LAB — SURGICAL PCR SCREEN
MRSA, PCR: NEGATIVE
Staphylococcus aureus: NEGATIVE

## 2017-08-23 LAB — GLUCOSE, CAPILLARY
Glucose-Capillary: 186 mg/dL — ABNORMAL HIGH (ref 65–99)
Glucose-Capillary: 247 mg/dL — ABNORMAL HIGH (ref 65–99)

## 2017-08-23 SURGERY — AV NODE ABLATION

## 2017-08-23 MED ORDER — HEPARIN (PORCINE) IN NACL 1000-0.9 UT/500ML-% IV SOLN
INTRAVENOUS | Status: AC
  Filled 2017-08-23: qty 500

## 2017-08-23 MED ORDER — CEFAZOLIN SODIUM-DEXTROSE 2-4 GM/100ML-% IV SOLN
INTRAVENOUS | Status: AC
  Filled 2017-08-23: qty 100

## 2017-08-23 MED ORDER — FUROSEMIDE 20 MG PO TABS
20.0000 mg | ORAL_TABLET | Freq: Every day | ORAL | Status: DC
  Administered 2017-08-23 – 2017-08-24 (×2): 20 mg via ORAL
  Filled 2017-08-23 (×2): qty 1

## 2017-08-23 MED ORDER — ACETAMINOPHEN 325 MG PO TABS: 325.0000 mg | ORAL_TABLET | ORAL | Status: DC | PRN

## 2017-08-23 MED ORDER — FENTANYL CITRATE (PF) 100 MCG/2ML IJ SOLN
INTRAMUSCULAR | Status: AC
  Filled 2017-08-23: qty 2

## 2017-08-23 MED ORDER — LORAZEPAM 1 MG PO TABS
1.0000 mg | ORAL_TABLET | Freq: Every day | ORAL | Status: DC
  Administered 2017-08-23: 1 mg via ORAL
  Filled 2017-08-23: qty 1

## 2017-08-23 MED ORDER — METOPROLOL TARTRATE 5 MG/5ML IV SOLN
INTRAVENOUS | Status: AC
  Filled 2017-08-23: qty 5

## 2017-08-23 MED ORDER — LIDOCAINE HCL 1 % IJ SOLN
INTRAMUSCULAR | Status: AC
  Filled 2017-08-23: qty 60

## 2017-08-23 MED ORDER — LIDOCAINE HCL (PF) 1 % IJ SOLN
INTRAMUSCULAR | Status: DC | PRN
  Administered 2017-08-23: 50 mL

## 2017-08-23 MED ORDER — HEPARIN (PORCINE) IN NACL 2-0.9 UNITS/ML
INTRAMUSCULAR | Status: AC | PRN
  Administered 2017-08-23 (×2): 500 mL

## 2017-08-23 MED ORDER — METOPROLOL TARTRATE 5 MG/5ML IV SOLN
INTRAVENOUS | Status: DC | PRN
  Administered 2017-08-23 (×2): 5 mg via INTRAVENOUS

## 2017-08-23 MED ORDER — ONDANSETRON HCL 4 MG/2ML IJ SOLN: 4.0000 mg | Freq: Four times a day (QID) | INTRAMUSCULAR | Status: DC | PRN

## 2017-08-23 MED ORDER — METFORMIN HCL ER 500 MG PO TB24
500.0000 mg | ORAL_TABLET | Freq: Two times a day (BID) | ORAL | Status: DC
  Administered 2017-08-23 – 2017-08-24 (×2): 500 mg via ORAL
  Filled 2017-08-23 (×2): qty 1

## 2017-08-23 MED ORDER — ALBUTEROL SULFATE (2.5 MG/3ML) 0.083% IN NEBU: 3.0000 mL | INHALATION_SOLUTION | Freq: Three times a day (TID) | RESPIRATORY_TRACT | Status: DC | PRN

## 2017-08-23 MED ORDER — BUPIVACAINE HCL (PF) 0.25 % IJ SOLN
INTRAMUSCULAR | Status: AC
Start: 2017-08-23 — End: ?
  Filled 2017-08-23: qty 30

## 2017-08-23 MED ORDER — SODIUM CHLORIDE 0.9 % IV SOLN
INTRAVENOUS | Status: DC
  Administered 2017-08-23: 12:00:00 via INTRAVENOUS

## 2017-08-23 MED ORDER — FLUTICASONE PROPIONATE 50 MCG/ACT NA SUSP
1.0000 | Freq: Every day | NASAL | Status: DC | PRN
  Filled 2017-08-23: qty 16

## 2017-08-23 MED ORDER — TRAMADOL HCL 50 MG PO TABS
50.0000 mg | ORAL_TABLET | Freq: Three times a day (TID) | ORAL | Status: DC | PRN
  Administered 2017-08-24: 50 mg via ORAL
  Filled 2017-08-23 (×2): qty 1

## 2017-08-23 MED ORDER — CHLORHEXIDINE GLUCONATE 4 % EX LIQD: 60.0000 mL | Freq: Once | CUTANEOUS | Status: DC

## 2017-08-23 MED ORDER — MUPIROCIN 2 % EX OINT: 1.0000 "application " | TOPICAL_OINTMENT | Freq: Once | CUTANEOUS | Status: DC

## 2017-08-23 MED ORDER — ATORVASTATIN CALCIUM 40 MG PO TABS
40.0000 mg | ORAL_TABLET | Freq: Every day | ORAL | Status: DC
  Administered 2017-08-23: 40 mg via ORAL
  Filled 2017-08-23: qty 1

## 2017-08-23 MED ORDER — POTASSIUM CHLORIDE CRYS ER 20 MEQ PO TBCR
20.0000 meq | EXTENDED_RELEASE_TABLET | Freq: Two times a day (BID) | ORAL | Status: DC
  Administered 2017-08-23 – 2017-08-24 (×2): 20 meq via ORAL
  Filled 2017-08-23 (×2): qty 1

## 2017-08-23 MED ORDER — LEVOTHYROXINE SODIUM 100 MCG PO TABS
100.0000 ug | ORAL_TABLET | Freq: Every day | ORAL | Status: DC
  Administered 2017-08-24: 100 ug via ORAL
  Filled 2017-08-23: qty 1

## 2017-08-23 MED ORDER — SODIUM CHLORIDE 0.9 % IV SOLN
INTRAVENOUS | Status: AC
  Filled 2017-08-23: qty 2

## 2017-08-23 MED ORDER — BUPIVACAINE HCL (PF) 0.25 % IJ SOLN
INTRAMUSCULAR | Status: DC | PRN
  Administered 2017-08-23: 30 mL

## 2017-08-23 MED ORDER — SODIUM CHLORIDE 0.9 % IV SOLN
80.0000 mg | INTRAVENOUS | Status: AC
  Administered 2017-08-23: 80 mg

## 2017-08-23 MED ORDER — CEFAZOLIN SODIUM-DEXTROSE 2-4 GM/100ML-% IV SOLN
2.0000 g | INTRAVENOUS | Status: AC
  Administered 2017-08-23: 2 g via INTRAVENOUS

## 2017-08-23 MED ORDER — FENTANYL CITRATE (PF) 100 MCG/2ML IJ SOLN
INTRAMUSCULAR | Status: DC | PRN
  Administered 2017-08-23 (×4): 12.5 ug via INTRAVENOUS

## 2017-08-23 MED ORDER — MUPIROCIN 2 % EX OINT
TOPICAL_OINTMENT | CUTANEOUS | Status: AC
  Administered 2017-08-23: 1
  Filled 2017-08-23: qty 22

## 2017-08-23 MED ORDER — MIDAZOLAM HCL 5 MG/5ML IJ SOLN
INTRAMUSCULAR | Status: AC
  Filled 2017-08-23: qty 5

## 2017-08-23 MED ORDER — SODIUM CHLORIDE 0.9 % IV SOLN: INTRAVENOUS | Status: DC

## 2017-08-23 MED ORDER — MIDAZOLAM HCL 5 MG/5ML IJ SOLN
INTRAMUSCULAR | Status: DC | PRN
  Administered 2017-08-23 (×4): 1 mg via INTRAVENOUS

## 2017-08-23 MED ORDER — CEFAZOLIN SODIUM-DEXTROSE 1-4 GM/50ML-% IV SOLN
1.0000 g | Freq: Four times a day (QID) | INTRAVENOUS | Status: AC
  Administered 2017-08-23 – 2017-08-24 (×3): 1 g via INTRAVENOUS
  Filled 2017-08-23 (×3): qty 50

## 2017-08-23 MED ORDER — VITAMIN E 180 MG (400 UNIT) PO CAPS
400.0000 [IU] | ORAL_CAPSULE | Freq: Every day | ORAL | Status: DC
  Administered 2017-08-24: 400 [IU] via ORAL
  Filled 2017-08-23 (×2): qty 1

## 2017-08-23 SURGICAL SUPPLY — 17 items
CABLE SURGICAL S-101-97-12 (CABLE) ×1 IMPLANT
CATH CELSIUS THERMO F CV 7FR (ABLATOR) ×1 IMPLANT
CATH RIGHTSITE C315HIS02 (CATHETERS) ×2 IMPLANT
IPG PACE AZUR XT DR MRI W1DR01 (Pacemaker) IMPLANT
LEAD CAPSURE NOVUS 5076-52CM (Lead) ×1 IMPLANT
LEAD SELECT SECURE 3830 383069 (Lead) IMPLANT
PACE AZURE XT DR MRI W1DR01 (Pacemaker) ×2 IMPLANT
PACK EP LATEX FREE (CUSTOM PROCEDURE TRAY) ×2
PACK EP LF (CUSTOM PROCEDURE TRAY) IMPLANT
PAD DEFIB LIFELINK (PAD) ×1 IMPLANT
SELECT SECURE 3830 383069 (Lead) ×2 IMPLANT
SHEATH CLASSIC 7F (SHEATH) ×2 IMPLANT
SHEATH CLASSIC 9F (SHEATH) ×1 IMPLANT
SHEATH PINNACLE 8F 10CM (SHEATH) ×1 IMPLANT
SLITTER 6232ADJ (MISCELLANEOUS) ×1 IMPLANT
TRAY PACEMAKER INSERTION (PACKS) ×1 IMPLANT
WIRE HI TORQ VERSACORE-J 145CM (WIRE) ×1 IMPLANT

## 2017-08-23 NOTE — Interval H&P Note (Signed)
History and Physical Interval Note:  08/23/2017 3:37 PM  Ann Fletcher  has presented today for surgery, with the diagnosis of afib  The various methods of treatment have been discussed with the patient and family. After consideration of risks, benefits and other options for treatment, the patient has consented to  Procedure(s): AV NODE ABLATION (N/A) PACEMAKER IMPLANT (N/A) as a surgical intervention .  The patient's history has been reviewed, patient examined, no change in status, stable for surgery.  I have reviewed the patient's chart and labs.  Questions were answered to the patient's satisfaction.     Ann Fletcher

## 2017-08-23 NOTE — Discharge Summary (Addendum)
ELECTROPHYSIOLOGY PROCEDURE DISCHARGE SUMMARY   Patient ID: Ann Fletcher,  MRN: [id number], DOB/AGE: [birth year] 82 y.o.  Admit date: 08/23/2017 Discharge date: 08/24/2017  Primary Care Physician: [name], MD Primary Cardiologist: [name] Electrophysiologist: Lovena Le  Primary Discharge Diagnosis:  Permanent AF with RVR and diastolic heart failure status post pacemaker implantation and AVN ablation this admission  Secondary Discharge Diagnosis:  1.  Chronic diastolic heart failure 2.  HTN 3.  Hypothyroidism  Allergies  Allergen Reactions  . Pradaxa [Dabigatran Etexilate Mesylate] Nausea And Vomiting  . Nsaids Other (See Comments)    Stomach cramps  . Sulfa Antibiotics Nausea And Vomiting and Other (See Comments)    GI upset  . Betadine [Povidone Iodine] Itching     Procedures This Admission:  1.  Implantation of a Medtronic dual chamber PPM and AVN ablation on 08/23/17 by Dr Lovena Le.  See op note for full details. There were no immediate post procedure complications. 2.  CXR on 08/24/17 demonstrated no pneumothorax status post device implantation.   Brief HPI: LEGEND PECORE is a 84 y.o. female with history of chronic diastolic CHF, DM, HTN, HLD, chronic anemia, probable CKD stage III by labs, and persistent atrial fibrillation. She was referred to electrophysiology in the outpatient setting for consideration of PPM implantation. Her atrial fib was remained uncontrolled despite optimization of therapy. It was felt she would benefit from AV node ablation and PPM. Risks, benefits, and alternatives to PPM implantation and AVN ablation were reviewed with the patient who wished to proceed.   Hospital Course:  The patient was admitted and underwent implantation of a PPM and AVN ablation with details as outlined above.  She  was monitored on telemetry overnight which demonstrated afib with ventricular pacing. Her diltiazem and metoprolol were stopped. Left chest was without  hematoma or ecchymosis.  The device was interrogated; Dr. Tanna Furry note states "Note that her His bundle lead is not capturing but is in place and she is doing well with pacing only from RV lead. I have no plans to make any adjustments. She will be left pacing VVIR at 90 with plans to reduce the rate at her incision check in 10 days." CXR was obtained and demonstrated no pneumothorax status post device implantation. Dr. Lovena Le reviewed and felt leads were in the ventricle. He recommends to hold her anticoagulation for several days and restart on Tuesday 5/28. He recommends she go back home on home diuretic dose. It appears the patient has a history of elevated potassium level in prior labs and has been on KCl 27meq BID most recently - given K of 4.7 this was stopped completely. She was advised to f/u PCP for both potassium recheck and BP monitoring given med changes, as well as chronic appearing anemia. EP f/u was arranged. Wound care, arm mobility, and restrictions were reviewed with the patient.  The patient was examined and considered stable for discharge to home.    Physical Exam: Vitals:   08/23/17 2000 08/23/17 2035 08/24/17 0046 08/24/17 0507  BP:  138/62 125/73 121/71  Pulse: 89 88 89 89  Resp: 16 19 (!) 21 17  Temp:  97.9 F (36.6 C)  98 F (36.7 C)  TempSrc:  Oral  Oral  SpO2: 98% 97% 95% 98%  Weight:    150 lb 6.4 oz (68.2 kg)  Height:        Labs:   Lab Results  Component Value Date   WBC 8.2 08/16/2017   HGB 9.2 (  L) 08/16/2017   HCT 29.1 (L) 08/16/2017   MCV 77 (L) 08/16/2017   PLT 181 08/16/2017   Discharge Medications:  Allergies as of 08/24/2017      Reactions   Pradaxa [dabigatran Etexilate Mesylate] Nausea And Vomiting   Nsaids Other (See Comments)   Stomach cramps   Sulfa Antibiotics Nausea And Vomiting, Other (See Comments)   GI upset   Betadine [povidone Iodine] Itching      Medication List    STOP taking these medications   diltiazem 120 MG 24 hr  capsule Commonly known as:  CARDIZEM CD   metoprolol succinate 100 MG 24 hr tablet Commonly known as:  TOPROL-XL   potassium chloride SA 20 MEQ tablet Commonly known as:  K-DUR,KLOR-CON     TAKE these medications   albuterol 108 (90 Base) MCG/ACT inhaler Commonly known as:  PROVENTIL HFA;VENTOLIN HFA Inhale 2 puffs into the lungs 3 (three) times daily as needed for wheezing or shortness of breath.   atorvastatin 40 MG tablet Commonly known as:  LIPITOR Take 40 mg by mouth at bedtime.   ELIQUIS 5 MG Tabs tablet Generic drug:  apixaban TAKE ONE TABLET TWICE DAILY   fluticasone 50 MCG/ACT nasal spray Commonly known as:  FLONASE Place 1 spray into both nostrils daily as needed for allergies.   furosemide 80 MG tablet Commonly known as:  LASIX TAKE ONE TABLET EACH DAY What changed:  See the new instructions.   levothyroxine 100 MCG tablet Commonly known as:  SYNTHROID, LEVOTHROID Take 100 mcg by mouth daily before breakfast.   LORazepam 1 MG tablet Commonly known as:  ATIVAN Take 1 mg by mouth at bedtime.   metFORMIN 500 MG 24 hr tablet Commonly known as:  GLUCOPHAGE-XR Take 500 mg by mouth 2 (two) times daily.   OSCAL 500/200 D-3 PO Take 1 tablet by mouth daily.   traMADol 50 MG tablet Commonly known as:  ULTRAM Take 50 mg by mouth every 8 (eight) hours as needed for moderate pain.   vitamin E 400 UNIT capsule Generic drug:  vitamin E Take 400 Units by mouth daily.       Disposition:  Discharge Instructions    Diet - low sodium heart healthy   Complete by:  As directed    Discharge instructions   Complete by:  As directed    Please note many several medication changes.  !!!!!!!!!!!!!!!!!!!!!!!!!!!!!! Important - do not restart your Eliquis until Tuesday 08/27/17.  Your metoprolol and diltiazem were stopped. Your potassium was stopped as your potassium has been upper limits of normal lately, and high in the past. Please follow up with primary care for  recheck of this, as well as your blood pressure since your medications are being adjusted.   Increase activity slowly   Complete by:  As directed    Please see attached sheet at the end of your After-Visit Summary for instructions on wound care, activity, and bathing.     Follow-up Information    [facility] [facility] Follow up.   Specialty:  Cardiology Why:  09/02/17 at 2:30PM - this is for a wound check visit with device nurse Contact information: [address] [place]       [name], MD Follow up on 11/25/2017.   Specialty:  Cardiology Why:  at 2:15PM  Contact information: [address] [place] [address] [phone]  Duration of Discharge Encounter: Greater than 30 minutes including physician time.  Signed, Chanetta Marshall, NP 08/23/2017 2:36 PM  Previously started discharge summary was addended/updated on day of discharge to reflect most current information at time of discharge. Lisbeth Renshaw Dunn PA-C 08/24/2017 10:57 AM  EP Attending  Patient seen and examined. Agree with above. She is stable for DC home. Usual followup.   Mikle Bosworth.D.

## 2017-08-24 ENCOUNTER — Ambulatory Visit (HOSPITAL_COMMUNITY): Payer: Medicare Other

## 2017-08-24 ENCOUNTER — Encounter (HOSPITAL_COMMUNITY): Payer: Self-pay | Admitting: Physician Assistant

## 2017-08-24 DIAGNOSIS — E039 Hypothyroidism, unspecified: Secondary | ICD-10-CM | POA: Diagnosis not present

## 2017-08-24 DIAGNOSIS — I5032 Chronic diastolic (congestive) heart failure: Secondary | ICD-10-CM | POA: Diagnosis not present

## 2017-08-24 DIAGNOSIS — I110 Hypertensive heart disease with heart failure: Secondary | ICD-10-CM | POA: Diagnosis not present

## 2017-08-24 DIAGNOSIS — M1990 Unspecified osteoarthritis, unspecified site: Secondary | ICD-10-CM | POA: Diagnosis not present

## 2017-08-24 DIAGNOSIS — I4891 Unspecified atrial fibrillation: Secondary | ICD-10-CM | POA: Diagnosis not present

## 2017-08-24 DIAGNOSIS — Z9889 Other specified postprocedural states: Secondary | ICD-10-CM

## 2017-08-24 DIAGNOSIS — Z7984 Long term (current) use of oral hypoglycemic drugs: Secondary | ICD-10-CM | POA: Diagnosis not present

## 2017-08-24 DIAGNOSIS — E785 Hyperlipidemia, unspecified: Secondary | ICD-10-CM | POA: Diagnosis not present

## 2017-08-24 DIAGNOSIS — I482 Chronic atrial fibrillation: Secondary | ICD-10-CM | POA: Diagnosis not present

## 2017-08-24 DIAGNOSIS — M810 Age-related osteoporosis without current pathological fracture: Secondary | ICD-10-CM | POA: Diagnosis not present

## 2017-08-24 DIAGNOSIS — R079 Chest pain, unspecified: Secondary | ICD-10-CM | POA: Diagnosis not present

## 2017-08-24 DIAGNOSIS — E119 Type 2 diabetes mellitus without complications: Secondary | ICD-10-CM | POA: Diagnosis not present

## 2017-08-24 DIAGNOSIS — Z882 Allergy status to sulfonamides status: Secondary | ICD-10-CM | POA: Diagnosis not present

## 2017-08-24 DIAGNOSIS — Z7951 Long term (current) use of inhaled steroids: Secondary | ICD-10-CM | POA: Diagnosis not present

## 2017-08-24 DIAGNOSIS — Z7901 Long term (current) use of anticoagulants: Secondary | ICD-10-CM | POA: Diagnosis not present

## 2017-08-24 LAB — GLUCOSE, CAPILLARY: Glucose-Capillary: 147 mg/dL — ABNORMAL HIGH (ref 65–99)

## 2017-08-24 NOTE — Progress Notes (Signed)
Progress Note  Patient Name: Ann Fletcher Date of Encounter: 08/24/2017  Primary Cardiologist: Jacinta Shoe   Subjective   No chest pain or sob. Minimal incisional soreness. The patient is doing well after PPM insertion and AV node ablation for uncontrolled atrial fib.   Inpatient Medications    Scheduled Meds: . atorvastatin  40 mg Oral QHS  . furosemide  20 mg Oral Daily  . levothyroxine  100 mcg Oral QAC breakfast  . LORazepam  1 mg Oral QHS  . metFORMIN  500 mg Oral BID  . potassium chloride SA  20 mEq Oral BID  . vitamin E  400 Units Oral Daily   Continuous Infusions: .  ceFAZolin (ANCEF) IV 1 g (08/24/17 0904)   PRN Meds: acetaminophen, albuterol, fluticasone, ondansetron (ZOFRAN) IV, traMADol   Vital Signs    Vitals:   08/23/17 2000 08/23/17 2035 08/24/17 0046 08/24/17 0507  BP:  138/62 125/73 121/71  Pulse: 89 88 89 89  Resp: 16 19 (!) 21 17  Temp:  97.9 F (36.6 C)  98 F (36.7 C)  TempSrc:  Oral  Oral  SpO2: 98% 97% 95% 98%  Weight:    150 lb 6.4 oz (68.2 kg)  Height:        Intake/Output Summary (Last 24 hours) at 08/24/2017 0928 Last data filed at 08/24/2017 7829 Gross per 24 hour  Intake 220 ml  Output 425 ml  Net -205 ml   Filed Weights   08/23/17 1145 08/23/17 1828 08/24/17 0507  Weight: 153 lb (69.4 kg) 151 lb 14.4 oz (68.9 kg) 150 lb 6.4 oz (68.2 kg)    Telemetry    Atrial fib with ventricular pacing - Personally Reviewed  ECG    Atrial fib with ventricular pacing - Personally Reviewed  Physical Exam   GEN: No acute distress.   Neck: 6 cm JVD Cardiac: RRR, no murmurs, rubs, or gallops.  Respiratory: Clear to auscultation bilaterally. GI: Soft, nontender, non-distended  MS: No edema; No deformity. Neuro:  Nonfocal  Psych: Normal affect   Labs    ChemistryNo results for input(s): NA, K, CL, CO2, GLUCOSE, BUN, CREATININE, CALCIUM, PROT, ALBUMIN, AST, ALT, ALKPHOS, BILITOT, GFRNONAA, GFRAA, ANIONGAP in the last 168 hours.    HematologyNo results for input(s): WBC, RBC, HGB, HCT, MCV, MCH, MCHC, RDW, PLT in the last 168 hours.  Cardiac EnzymesNo results for input(s): TROPONINI in the last 168 hours. No results for input(s): TROPIPOC in the last 168 hours.   BNPNo results for input(s): BNP, PROBNP in the last 168 hours.   DDimer No results for input(s): DDIMER in the last 168 hours.   Radiology    No results found.  Cardiac Studies   none  Patient Profile     84 y.o. female admitted for AV node ablation and PPM insertion  Assessment & Plan    1. Uncontrolled Atrial fib 2. Chronic diastolic heart failure 3. S/p PPM insertion and AV node ablation. She is pacing normally. Note that her His bundle lead is not capturing but is in place and she is doing well with pacing only from RV lead. I have no plans to make any adjustments. She will be left pacing VVIR at 90 with plans to reduce the rate at her incision check in 10 days.   Ann Fletcher,M.D.  For questions or updates, please contact Ann Fletcher Please consult [email or web address] for contact info under Cardiology/STEMI.      Signed, Ann Peru, MD  08/24/2017, 9:28  AM  Patient ID: Ann Fletcher, female   DOB: 04/23/1932, 84 y.o.   MRN: [id number]

## 2017-08-24 NOTE — Progress Notes (Signed)
The patient has a family friend at bedside to help with transportation home to her husband. They have been given discharge instructions along with a new medication list and what to take today. She has been educated on pacer care instructions and has prescriptions to pick up along with follow up appointments.   Saddie Benders RN

## 2017-08-24 NOTE — Discharge Instructions (Signed)
° ° °  Supplemental Discharge Instructions for  Pacemaker Patients  Activity No heavy lifting or vigorous activity with your left/right arm for 6 to 8 weeks.  Do not raise your left/right arm above your head for one week.  Gradually raise your affected arm as drawn below.           __        08/27/17                    08/28/17                       08/29/17                    08/30/17  NO DRIVING for 1 week    ; you may begin driving on   11/20/58  .  WOUND CARE - Keep the wound area clean and dry.  Do not get this area wet for one week. No showers for one week; you may shower on   08/30/17  . - The tape/steri-strips on your wound will fall off; do not pull them off.  No bandage is needed on the site.  DO  NOT apply any creams, oils, or ointments to the wound area. - If you notice any drainage or discharge from the wound, any swelling or bruising at the site, or you develop a fever > 101? F after you are discharged home, call the office at once.  Special Instructions - You are still able to use cellular telephones; use the ear opposite the side where you have your pacemaker/defibrillator.  Avoid carrying your cellular phone near your device. - When traveling through airports, show security personnel your identification card to avoid being screened in the metal detectors.  Ask the security personnel to use the hand wand. - Avoid arc welding equipment, MRI testing (magnetic resonance imaging), TENS units (transcutaneous nerve stimulators).  Call the office for questions about other devices. - Avoid electrical appliances that are in poor condition or are not properly grounded. - Microwave ovens are safe to be near or to operate.

## 2017-08-27 ENCOUNTER — Ambulatory Visit: Payer: Self-pay | Admitting: Pulmonary Disease

## 2017-08-27 ENCOUNTER — Encounter (HOSPITAL_COMMUNITY): Payer: Self-pay | Admitting: Internal Medicine

## 2017-08-27 MED FILL — Bupivacaine HCl Preservative Free (PF) Inj 0.25%: INTRAMUSCULAR | Qty: 30 | Status: AC

## 2017-08-28 NOTE — Progress Notes (Signed)
Called and spoke to pt. Pt is alert and oriented. Informed her of the results per Dr. Vaughan Browner, pt read these results back to me. Pt aware to keep upcoming appt. Nothing further needed at this time.

## 2017-09-02 ENCOUNTER — Ambulatory Visit (INDEPENDENT_AMBULATORY_CARE_PROVIDER_SITE_OTHER): Payer: Medicare Other | Admitting: *Deleted

## 2017-09-02 DIAGNOSIS — I442 Atrioventricular block, complete: Secondary | ICD-10-CM

## 2017-09-02 DIAGNOSIS — Z950 Presence of cardiac pacemaker: Secondary | ICD-10-CM

## 2017-09-02 DIAGNOSIS — I481 Persistent atrial fibrillation: Secondary | ICD-10-CM | POA: Diagnosis not present

## 2017-09-02 DIAGNOSIS — I4819 Other persistent atrial fibrillation: Secondary | ICD-10-CM

## 2017-09-02 LAB — CUP PACEART INCLINIC DEVICE CHECK
Battery Remaining Longevity: 89 mo
Battery Voltage: 3.2 V
Brady Statistic AP VP Percent: 0 %
Brady Statistic AP VS Percent: 0 %
Brady Statistic AS VP Percent: 99.98 %
Brady Statistic AS VS Percent: 0.02 %
Brady Statistic RA Percent Paced: 0 %
Brady Statistic RV Percent Paced: 99.98 %
Date Time Interrogation Session: 20190603175706
Implantable Lead Implant Date: 20190524
Implantable Lead Implant Date: 20190524
Implantable Lead Location: 753860
Implantable Lead Location: 753860
Implantable Lead Model: 3830
Implantable Lead Model: 5076
Implantable Pulse Generator Implant Date: 20190524
Lead Channel Impedance Value: 361 Ohm
Lead Channel Impedance Value: 399 Ohm
Lead Channel Impedance Value: 475 Ohm
Lead Channel Impedance Value: 475 Ohm
Lead Channel Pacing Threshold Amplitude: 0.5 V
Lead Channel Pacing Threshold Pulse Width: 0.4 ms
Lead Channel Sensing Intrinsic Amplitude: 6 mV
Lead Channel Setting Pacing Amplitude: 4 V
Lead Channel Setting Pacing Pulse Width: 0.4 ms
Lead Channel Setting Sensing Sensitivity: 4 mV

## 2017-09-02 NOTE — Progress Notes (Signed)
Wound check appointment. Steri-strips removed. Wound without redness or edema. Incision edges approximated, wound well healed. Possible stitch noted at R lateral corner of incision, unable to remove. Antibiotic ointment and bandaid applied, patient instructed to wash daily with soap and water and reapply ointment and bandaid x3-4 days. Patient aware to call for signs/symptoms of infection. Wound recheck next week.   Normal device function, s/p AVN ablation on 08/23/17. RV threshold, sensing, and impedances consistent with implant measurements. His bundle lead (in RA port) not tested due to known non-capture per hospital notes. Device programmed at 3.5V with auto capture programmed on for extra safety margin until 3 month visit. RV capture high threshold alert turned on. Histogram distribution blunted due to VVI 90 programming. Reduced lower pacing rate to 80bpm, rate response enabled with upper sensor rate at 120bpm, patient walked in office and denies increased ShOB/chest discomfort. Permanent AF, +Eliquis, patient only taking Eliquis once daily, educated about importance of dosing compliance. No high ventricular rates noted. Patient educated about wound care, arm mobility, lifting restrictions, and Carelink monitor. ROV with [facility] on 09/12/17 for wound recheck/reduce base pacing rate, ROV with GT on 11/25/17.

## 2017-09-02 NOTE — Patient Instructions (Addendum)
--   RESUME taking your Eliquis 5mg  tablet twice daily -- Wash incision daily when bathing with soap and water, pat dry.  Apply antibiotic ointment and bandaid daily for 3-4 days.  -- Call the [facility] at [phone] if you notice any signs/symptoms of infection, including drainage, redness, swelling, fever, or chills.

## 2017-09-12 ENCOUNTER — Ambulatory Visit (INDEPENDENT_AMBULATORY_CARE_PROVIDER_SITE_OTHER): Payer: Self-pay | Admitting: *Deleted

## 2017-09-12 DIAGNOSIS — Z950 Presence of cardiac pacemaker: Secondary | ICD-10-CM

## 2017-09-12 DIAGNOSIS — I442 Atrioventricular block, complete: Secondary | ICD-10-CM

## 2017-09-12 LAB — CUP PACEART INCLINIC DEVICE CHECK
Battery Remaining Longevity: 135 mo
Battery Voltage: 3.19 V
Brady Statistic AP VP Percent: 0 %
Brady Statistic AP VS Percent: 0 %
Brady Statistic AS VP Percent: 99.99 %
Brady Statistic AS VS Percent: 0.01 %
Brady Statistic RA Percent Paced: 0 %
Brady Statistic RV Percent Paced: 99.99 %
Date Time Interrogation Session: 20190613161231
Implantable Lead Implant Date: 20190524
Implantable Lead Implant Date: 20190524
Implantable Lead Location: 753860
Implantable Lead Location: 753860
Implantable Lead Model: 3830
Implantable Lead Model: 5076
Implantable Pulse Generator Implant Date: 20190524
Lead Channel Impedance Value: 361 Ohm
Lead Channel Impedance Value: 437 Ohm
Lead Channel Impedance Value: 475 Ohm
Lead Channel Impedance Value: 513 Ohm
Lead Channel Pacing Threshold Amplitude: 0.5 V
Lead Channel Pacing Threshold Pulse Width: 0.4 ms
Lead Channel Setting Pacing Amplitude: 3.5 V
Lead Channel Setting Pacing Pulse Width: 0.4 ms
Lead Channel Setting Sensing Sensitivity: 4 mV

## 2017-09-12 NOTE — Progress Notes (Signed)
Wound recheck appointment. Wound without redness or edema. Incision edges approximated, wound healing well. No additional stitch noted. Patient instructed to continue washing site daily with soap, water, and clean washcloth per GT. Patient aware to call our office if any signs/symptoms of infection are noted.  Normal device function. RV threshold and impedance stable. RV output reduced to 3.5V (previously at 4.0V) with auto capture programmed on for extra safety margin until 3 month visit. Persistent AF, +Eliquis. No high ventricular rates noted. Per GT, lower rate reduced to 70bpm s/p AVN ablation on 08/23/17. Patient educated about wound care, arm mobility, lifting restrictions. ROV with GT on 11/25/17.

## 2017-09-17 ENCOUNTER — Other Ambulatory Visit: Payer: Self-pay

## 2017-09-17 NOTE — Patient Outreach (Signed)
[facility]) Care Management  09/17/2017  Ann Fletcher May 02, 1932 [id number]   Medication Adherence call to Mrs : Ann Fletcher spoke with patient she still has medication she said she will order it when she is almost done with what she have : [employer] has a disconnected Number Her cell phone number is [phone]  Ann Fletcher is showing past due under [place].  [place] Management Direct Dial [phone]  Fax [phone] [email or web address]

## 2017-09-26 ENCOUNTER — Encounter: Payer: Self-pay | Admitting: Pulmonary Disease

## 2017-09-26 ENCOUNTER — Other Ambulatory Visit: Payer: Self-pay | Admitting: Pulmonary Disease

## 2017-09-26 ENCOUNTER — Ambulatory Visit (INDEPENDENT_AMBULATORY_CARE_PROVIDER_SITE_OTHER): Payer: Medicare Other | Admitting: Pulmonary Disease

## 2017-09-26 VITALS — BP 132/78 | HR 80 | Ht 62.0 in | Wt 149.2 lb

## 2017-09-26 DIAGNOSIS — R0689 Other abnormalities of breathing: Secondary | ICD-10-CM | POA: Diagnosis not present

## 2017-09-26 DIAGNOSIS — J849 Interstitial pulmonary disease, unspecified: Secondary | ICD-10-CM

## 2017-09-26 DIAGNOSIS — R0600 Dyspnea, unspecified: Secondary | ICD-10-CM

## 2017-09-26 NOTE — Patient Instructions (Signed)
Your CT scan from last month shows stable scarring of the lung I am glad that your breathing is doing better than before We will continue to monitor this.  Follow-up in 6 months.

## 2017-09-26 NOTE — Progress Notes (Signed)
Ann Fletcher    [id number]    08/24/1932  Primary Care Physician:Ramachandran, Mauro Kaufmann, MD  Referring Physician: [name], MD [address] [place], [place] [address]  Chief complaint:   NSIP fibrosis  History of paroxysmal atrial fibrillation on amiodarone  HPI: Ann Fletcher is a 84 year old with past medical history of paroxysmal atrial fibrillation first diagnosed in 2016 she underwent cardioversion which was unsuccessful. She was subsequently placed on amiodarone and a repeat cardioversion. She has been maintained on amiodarone since 2016. Recent PFTs was concerning for restriction and TLC impairment and she has been referred here for further evaluation. She has history of DVT in 2010 and was treated with Coumadin. She has been off anticoagulation until it was resumed in the setting of atrial fibrillation. She did not tolerate Pradaxa and is currently on Eliquis.  She complains of dyspnea on exertion, occasional wheeze. Denies any cough, sputum production. She is a lifelong nonsmoker with no known exposures.  She's had a CT of the chest which shows fibrosis in NSIP pattern. ILD profile was negative. After discussion with cardiology just taken off amiodarone. She continues on eliquis and has anemia which is being worked up by her primary care. There is no evidence of active bleed  Pets: None Occupation: Housewife Exposures: No known exposures Smoking history: Never smoker  Interim History: She underwent implantation of permanent pacemaker and AV nodal ablation in May 2019.  States that dyspnea slightly improved No new complaints today.  Denies any cough, sputum production, fevers, chills.  Outpatient Encounter Medications as of 09/26/2017  Medication Sig  . albuterol (PROVENTIL HFA;VENTOLIN HFA) 108 (90 BASE) MCG/ACT inhaler Inhale 2 puffs into the lungs 3 (three) times daily as needed for wheezing or shortness of breath.   [name] atorvastatin  (LIPITOR) 40 MG tablet Take 40 mg by mouth at bedtime.   . Calcium Carbonate-Vitamin D (OSCAL 500/200 D-3 PO) Take 1 tablet by mouth daily.   [name] ELIQUIS 5 MG TABS tablet TAKE ONE TABLET TWICE DAILY  . fluticasone (FLONASE) 50 MCG/ACT nasal spray Place 1 spray into both nostrils daily as needed for allergies.   . furosemide (LASIX) 80 MG tablet TAKE ONE TABLET EACH DAY (Patient taking differently: TAKE ONE TABLET (80mg ) EACH DAY)  . levothyroxine (SYNTHROID, LEVOTHROID) 100 MCG tablet Take 100 mcg by mouth daily before breakfast.   . LORazepam (ATIVAN) 1 MG tablet Take 1 mg by mouth 3 (three) times daily as needed for anxiety.   . metFORMIN (GLUCOPHAGE-XR) 500 MG 24 hr tablet Take 1,000 mg by mouth 2 (two) times daily.   . traMADol (ULTRAM) 50 MG tablet Take 50 mg by mouth every 8 (eight) hours as needed for moderate pain.   . vitamin E (VITAMIN E) 400 UNIT capsule Take 400 Units by mouth daily.   No facility-administered encounter medications on file as of 09/26/2017.     Allergies as of 09/26/2017 - Review Complete 09/26/2017  Allergen Reaction Noted  . Pradaxa [dabigatran etexilate mesylate] Nausea And Vomiting 03/24/2015  . Nsaids Other (See Comments) 12/13/2012  . Sulfa antibiotics Nausea And Vomiting and Other (See Comments) 10/29/2014  . Betadine [povidone iodine] Itching 02/17/2014    Past Medical History:  Diagnosis Date  . Anxiety   . Arthritis   . AV node arrhythmia    a. s/p AV node ablation/PPM 07/2017.  [name] Chronic diastolic CHF (congestive heart failure) ([place])    a. Acute exacerbation occurred in  setting of AF.  [name] CKD (chronic kidney disease), stage III ([place])   . Diabetes mellitus without complication ([place])    type 2  . H/O bladder infections   . Hyperlipidemia   . Hypertension   . Hypothyroidism   . Lower extremity edema   . Osteoporosis   . Persistent atrial fibrillation ([place])    a. diagnosed in 10/2014 by PCP, underwent failed TEE DCCV on 10/08/14, loaded with amio,  successfully DCCV on 10/15/14.    Past Surgical History:  Procedure Laterality Date  . AV NODE ABLATION N/A 08/23/2017   Procedure: AV NODE ABLATION;  Surgeon: [name], MD;  Location: [facility];  Service: Cardiovascular;  Laterality: N/A;  . BACK SURGERY  2001, 2003, 2006   x 3  . CARDIOVERSION N/A 10/08/2014   Procedure: CARDIOVERSION;  Surgeon: [name], MD;  Location: [facility];  Service: Cardiovascular;  Laterality: N/A;  . CARDIOVERSION N/A 10/15/2014   Procedure: CARDIOVERSION;  Surgeon: [name], MD;  Location: [facility] ENDOSCOPY;  Service: Cardiovascular;  Laterality: N/A;  . CARDIOVERSION N/A 11/15/2014   Procedure: CARDIOVERSION;  Surgeon: [name], MD;  Location: [facility];  Service: Cardiovascular;  Laterality: N/A;  . PACEMAKER IMPLANT N/A 08/23/2017   Procedure: PACEMAKER IMPLANT;  Surgeon: [name], MD;  Location: [facility];  Service: Cardiovascular;  Laterality: N/A;  . 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 TEE WITHOUT CARDIOVERSION N/A 10/08/2014   Procedure: TRANSESOPHAGEAL ECHOCARDIOGRAM (TEE);  Surgeon: [name], MD;  Location: [facility] ENDOSCOPY;  Service: Cardiovascular;  Laterality: N/A;  . TUBAL LIGATION      Family History  Problem Relation Age of Onset  . Pneumonia Mother   . Heart attack Father   . Diabetes Sister     Social History   Socioeconomic History  . Marital status: Married    Spouse name: Not on file  . Number of children: Not on file  . Years of education: Not on file  . Highest education level: Not on file  Occupational History  . Not on file  Social Needs  . Financial resource strain: Not on file  . Food insecurity:    Worry: Not on file    Inability: Not on file  . Transportation needs:    Medical: Not on file    Non-medical: Not on file  Tobacco Use  . Smoking status: Never Smoker  . Smokeless tobacco: Never Used  Substance and Sexual Activity  . Alcohol use: No  . Drug use: No    . Sexual activity: Yes    Birth control/protection: Surgical  Lifestyle  . Physical activity:    Days per week: Not on file    Minutes per session: Not on file  . Stress: Not on file  Relationships  . Social connections:    Talks on phone: Not on file    Gets together: Not on file    Attends religious service: Not on file    Active member of club or organization: Not on file    Attends meetings of clubs or organizations: Not on file    Relationship status: Not on file  . Intimate partner violence:    Fear of current or ex partner: Not on file    Emotionally abused: Not on file    Physically abused: Not on file    Forced sexual activity: Not on file  Other Topics Concern  . Not on file  Social History  Narrative  . Not on file    Review of systems: Review of Systems  Constitutional: Negative for fever and chills.  HENT: Negative.   Eyes: Negative for blurred vision.  Respiratory: as per HPI  Cardiovascular: Negative for chest pain and palpitations.  Gastrointestinal: Negative for vomiting, diarrhea, blood per rectum. Genitourinary: Negative for dysuria, urgency, frequency and hematuria.  Musculoskeletal: Negative for myalgias, back pain and joint pain.  Skin: Negative for itching and rash.  Neurological: Negative for dizziness, tremors, focal weakness, seizures and loss of consciousness.  Endo/Heme/Allergies: Negative for environmental allergies.  Psychiatric/Behavioral: Negative for depression, suicidal ideas and hallucinations.  All other systems reviewed and are negative.  Physical Exam: Blood pressure 132/78, pulse 80, height 5\' 2"  (1.575 m), weight 149 lb 3.2 oz (67.7 kg), SpO2 96 %. Gen:      No acute distress HEENT:  EOMI, sclera anicteric Neck:     No masses; no thyromegaly Lungs:    Scattered basilar crackles. CV:         Regular rate and rhythm; no murmurs Abd:      + bowel sounds; soft, non-tender; no palpable masses, no distension Ext:    No edema;  adequate peripheral perfusion Skin:      Warm and dry; no rash Neuro: alert and oriented x 3 Psych: normal mood and affect  Data Reviewed: CXR 10/14/14- cardiomegaly, pulmonary vascular congestion Chest x-ray 10/07/14-cardiomegaly, pulmonary vascular congestion CT scan chest 07/24/08-no pulmonary embolism, mosaic attenuation High res CT 08/10/16-  patchy groundglass and reticulation at the bases. High-res CT 08/19/2017-stable mild basilar reticulation, mild air trapping.  Cirrhosis, perihepatic ascites. I have reviewed all images personally  Echocardiogram 10/07/14 - Left ventricle: The cavity size was normal. Wall thickness was   normal. Systolic function was normal. The estimated ejection   fraction was in the range of 50% to 55%. - Mitral valve: There was mild regurgitation. - Left atrium: The atrium was moderately dilated. - Right ventricle: The cavity size was moderately dilated. - Right atrium: The atrium was severely dilated. - Tricuspid valve: There was severe regurgitation. - Pericardium, extracardiac: Small posterior pericardial effusion  LE Dopplers 09/07/08-right popliteal DVT  PFTs 06/29/16 FVC 2.29 (57%), FEV1 1.69 (77%), F/F 73, SVC 58%, DLCO 49% Moderate restriction with diffusion impairment. No obstruction This does not meet ATS standards for to be repeatability.  PFTs 09/26/2017-unable to complete.  ILD panel 5/16- Negative except for pANCA 1:80 and RNP 26 Hypersensitivity profile 08/15/16- negative  Assessment:  Evaluation for abnormal PFTs, ILD Unable to get reliable PFTs after 2 attempts. CT scan reviewed from last month which shows stable mild basal interstitial changes which could be from amiodarone toxicity.  She is currently off amiodarone since mid 2018 We will continue to monitor with annual imaging.  Plan/Recommendations: - Follow-up in 6 months  Marshell Garfinkel MD [facility] 09/26/2017, 2:02 PM  CC: [name], MD

## 2017-09-30 DIAGNOSIS — K625 Hemorrhage of anus and rectum: Secondary | ICD-10-CM | POA: Diagnosis not present

## 2017-09-30 DIAGNOSIS — I482 Chronic atrial fibrillation: Secondary | ICD-10-CM | POA: Diagnosis not present

## 2017-09-30 DIAGNOSIS — I10 Essential (primary) hypertension: Secondary | ICD-10-CM | POA: Diagnosis not present

## 2017-09-30 DIAGNOSIS — E1121 Type 2 diabetes mellitus with diabetic nephropathy: Secondary | ICD-10-CM | POA: Diagnosis not present

## 2017-10-31 ENCOUNTER — Emergency Department (HOSPITAL_COMMUNITY): Payer: Medicare Other

## 2017-10-31 ENCOUNTER — Other Ambulatory Visit: Payer: Self-pay

## 2017-10-31 ENCOUNTER — Inpatient Hospital Stay (HOSPITAL_COMMUNITY)
Admission: EM | Admit: 2017-10-31 | Discharge: 2017-11-07 | DRG: 291 | Disposition: A | Discharge status: Home Health | Payer: Medicare Other | Attending: Cardiovascular Disease | Admitting: Cardiovascular Disease

## 2017-10-31 DIAGNOSIS — R0602 Shortness of breath: Secondary | ICD-10-CM

## 2017-10-31 DIAGNOSIS — R112 Nausea with vomiting, unspecified: Secondary | ICD-10-CM | POA: Diagnosis not present

## 2017-10-31 DIAGNOSIS — R0902 Hypoxemia: Secondary | ICD-10-CM | POA: Diagnosis not present

## 2017-10-31 DIAGNOSIS — K2971 Gastritis, unspecified, with bleeding: Secondary | ICD-10-CM | POA: Diagnosis not present

## 2017-10-31 DIAGNOSIS — I480 Paroxysmal atrial fibrillation: Secondary | ICD-10-CM | POA: Diagnosis not present

## 2017-10-31 DIAGNOSIS — I5033 Acute on chronic diastolic (congestive) heart failure: Secondary | ICD-10-CM | POA: Diagnosis not present

## 2017-10-31 DIAGNOSIS — Z888 Allergy status to other drugs, medicaments and biological substances status: Secondary | ICD-10-CM

## 2017-10-31 DIAGNOSIS — J189 Pneumonia, unspecified organism: Secondary | ICD-10-CM | POA: Diagnosis present

## 2017-10-31 DIAGNOSIS — Z886 Allergy status to analgesic agent status: Secondary | ICD-10-CM

## 2017-10-31 DIAGNOSIS — Z7951 Long term (current) use of inhaled steroids: Secondary | ICD-10-CM

## 2017-10-31 DIAGNOSIS — K2970 Gastritis, unspecified, without bleeding: Secondary | ICD-10-CM | POA: Diagnosis not present

## 2017-10-31 DIAGNOSIS — E871 Hypo-osmolality and hyponatremia: Secondary | ICD-10-CM | POA: Diagnosis present

## 2017-10-31 DIAGNOSIS — D500 Iron deficiency anemia secondary to blood loss (chronic): Secondary | ICD-10-CM | POA: Diagnosis not present

## 2017-10-31 DIAGNOSIS — Z950 Presence of cardiac pacemaker: Secondary | ICD-10-CM | POA: Diagnosis not present

## 2017-10-31 DIAGNOSIS — E1122 Type 2 diabetes mellitus with diabetic chronic kidney disease: Secondary | ICD-10-CM | POA: Diagnosis not present

## 2017-10-31 DIAGNOSIS — E039 Hypothyroidism, unspecified: Secondary | ICD-10-CM | POA: Diagnosis not present

## 2017-10-31 DIAGNOSIS — Z7901 Long term (current) use of anticoagulants: Secondary | ICD-10-CM

## 2017-10-31 DIAGNOSIS — I361 Nonrheumatic tricuspid (valve) insufficiency: Secondary | ICD-10-CM | POA: Diagnosis not present

## 2017-10-31 DIAGNOSIS — I10 Essential (primary) hypertension: Secondary | ICD-10-CM | POA: Diagnosis not present

## 2017-10-31 DIAGNOSIS — Z79899 Other long term (current) drug therapy: Secondary | ICD-10-CM | POA: Diagnosis not present

## 2017-10-31 DIAGNOSIS — N183 Chronic kidney disease, stage 3 (moderate): Secondary | ICD-10-CM | POA: Diagnosis present

## 2017-10-31 DIAGNOSIS — E785 Hyperlipidemia, unspecified: Secondary | ICD-10-CM | POA: Diagnosis present

## 2017-10-31 DIAGNOSIS — Z7984 Long term (current) use of oral hypoglycemic drugs: Secondary | ICD-10-CM

## 2017-10-31 DIAGNOSIS — Z882 Allergy status to sulfonamides status: Secondary | ICD-10-CM | POA: Diagnosis not present

## 2017-10-31 DIAGNOSIS — D509 Iron deficiency anemia, unspecified: Secondary | ICD-10-CM | POA: Diagnosis not present

## 2017-10-31 DIAGNOSIS — I482 Chronic atrial fibrillation: Secondary | ICD-10-CM | POA: Diagnosis not present

## 2017-10-31 DIAGNOSIS — K648 Other hemorrhoids: Secondary | ICD-10-CM | POA: Diagnosis present

## 2017-10-31 DIAGNOSIS — I959 Hypotension, unspecified: Secondary | ICD-10-CM | POA: Diagnosis not present

## 2017-10-31 DIAGNOSIS — D508 Other iron deficiency anemias: Secondary | ICD-10-CM | POA: Diagnosis not present

## 2017-10-31 DIAGNOSIS — K635 Polyp of colon: Secondary | ICD-10-CM | POA: Diagnosis not present

## 2017-10-31 DIAGNOSIS — K5730 Diverticulosis of large intestine without perforation or abscess without bleeding: Secondary | ICD-10-CM | POA: Diagnosis present

## 2017-10-31 DIAGNOSIS — M810 Age-related osteoporosis without current pathological fracture: Secondary | ICD-10-CM | POA: Diagnosis present

## 2017-10-31 DIAGNOSIS — J849 Interstitial pulmonary disease, unspecified: Secondary | ICD-10-CM

## 2017-10-31 DIAGNOSIS — K921 Melena: Secondary | ICD-10-CM | POA: Diagnosis not present

## 2017-10-31 DIAGNOSIS — I130 Hypertensive heart and chronic kidney disease with heart failure and stage 1 through stage 4 chronic kidney disease, or unspecified chronic kidney disease: Secondary | ICD-10-CM | POA: Diagnosis not present

## 2017-10-31 LAB — CBC WITH DIFFERENTIAL/PLATELET
Abs Immature Granulocytes: 0.1 10*3/uL (ref 0.0–0.1)
Basophils Absolute: 0 10*3/uL (ref 0.0–0.1)
Basophils Relative: 0 %
Eosinophils Absolute: 0 10*3/uL (ref 0.0–0.7)
Eosinophils Relative: 0 %
HCT: 26.4 % — ABNORMAL LOW (ref 36.0–46.0)
Hemoglobin: 8 g/dL — ABNORMAL LOW (ref 12.0–15.0)
Immature Granulocytes: 1 %
Lymphocytes Relative: 5 %
Lymphs Abs: 0.5 10*3/uL — ABNORMAL LOW (ref 0.7–4.0)
MCH: 23.6 pg — ABNORMAL LOW (ref 26.0–34.0)
MCHC: 30.3 g/dL (ref 30.0–36.0)
MCV: 77.9 fL — ABNORMAL LOW (ref 78.0–100.0)
Monocytes Absolute: 0.7 10*3/uL (ref 0.1–1.0)
Monocytes Relative: 7 %
Neutro Abs: 8.4 10*3/uL — ABNORMAL HIGH (ref 1.7–7.7)
Neutrophils Relative %: 87 %
Platelets: 171 10*3/uL (ref 150–400)
RBC: 3.39 MIL/uL — ABNORMAL LOW (ref 3.87–5.11)
RDW: 16.9 % — ABNORMAL HIGH (ref 11.5–15.5)
WBC: 10.1 10*3/uL (ref 4.0–10.5)

## 2017-10-31 LAB — COMPREHENSIVE METABOLIC PANEL
ALT: 14 U/L (ref 0–44)
AST: 26 U/L (ref 15–41)
Albumin: 3.9 g/dL (ref 3.5–5.0)
Alkaline Phosphatase: 51 U/L (ref 38–126)
Anion gap: 15 (ref 5–15)
BUN: 24 mg/dL — ABNORMAL HIGH (ref 8–23)
CO2: 23 mmol/L (ref 22–32)
Calcium: 9.6 mg/dL (ref 8.9–10.3)
Chloride: 101 mmol/L (ref 98–111)
Creatinine, Ser: 1.33 mg/dL — ABNORMAL HIGH (ref 0.44–1.00)
GFR calc Af Amer: 42 mL/min — ABNORMAL LOW (ref >60)
GFR calc non Af Amer: 36 mL/min — ABNORMAL LOW (ref >60)
Glucose, Bld: 195 mg/dL — ABNORMAL HIGH (ref 70–99)
Potassium: 4.2 mmol/L (ref 3.5–5.1)
Sodium: 139 mmol/L (ref 135–145)
Total Bilirubin: 1 mg/dL (ref 0.3–1.2)
Total Protein: 7.2 g/dL (ref 6.5–8.1)

## 2017-10-31 LAB — BRAIN NATRIURETIC PEPTIDE: B Natriuretic Peptide: 582 pg/mL — ABNORMAL HIGH (ref 0.0–100.0)

## 2017-10-31 LAB — TROPONIN I: Troponin I: 0.03 ng/mL (ref <0.03)

## 2017-10-31 MED ORDER — ASPIRIN 81 MG PO CHEW
324.0000 mg | CHEWABLE_TABLET | Freq: Once | ORAL | Status: AC
  Administered 2017-11-01: 324 mg via ORAL
  Filled 2017-10-31: qty 4

## 2017-10-31 NOTE — ED Notes (Signed)
Patient transported to X-ray 

## 2017-10-31 NOTE — ED Provider Notes (Signed)
[facility] EMERGENCY DEPARTMENT Provider Note   CSN: [id number] Arrival date & time: 10/31/17  2055     History   Chief Complaint Chief Complaint  Patient presents with  . Nausea  . Emesis  . Shortness of Breath  . Weakness    HPI Ann Fletcher is a 84 y.o. female.   Emesis   This is a new problem. The current episode started 3 to 5 hours ago. The problem occurs 2 to 4 times per day. The problem has not changed since onset.Associated symptoms include cough.  Shortness of Breath  Associated symptoms include cough and vomiting.  Weakness  Associated symptoms include shortness of breath and vomiting.  Cough  This is a new problem. The current episode started more than 2 days ago. The problem occurs constantly. The problem has been gradually worsening. The cough is non-productive. There has been no fever. Associated symptoms include shortness of breath.    Past Medical History:  Diagnosis Date  . Anxiety   . Arthritis   . AV node arrhythmia    a. s/p AV node ablation/PPM 07/2017.  [name] Chronic diastolic CHF (congestive heart failure) ([place])    a. Acute exacerbation occurred in setting of AF.  [name] CKD (chronic kidney disease), stage III ([place])   . Diabetes mellitus without complication ([place])    type 2  . H/O bladder infections   . Hyperlipidemia   . Hypertension   . Hypothyroidism   . Lower extremity edema   . Osteoporosis   . Persistent atrial fibrillation ([place])    a. diagnosed in 10/2014 by PCP, underwent failed TEE DCCV on 10/08/14, loaded with amio, successfully DCCV on 10/15/14.    Patient Active Problem List   Diagnosis Date Noted  . Status post atrioventricular nodal ablation 08/24/2017  . Chronic diastolic CHF (congestive heart failure) ([place]) 08/24/2017  . PAF (paroxysmal atrial fibrillation) ([place]) 03/24/2015  . Type 2 diabetes mellitus with renal manifestations, controlled ([place]) 03/24/2015  . Anticoagulated 10/13/2014  . Atrial fibrillation with  RVR ([place]) 10/07/2014  . Essential hypertension 02/17/2014  . Hyperlipidemia 02/17/2014  . Lower extremity edema 02/17/2014  . Right carotid bruit 02/17/2014    Past Surgical History:  Procedure Laterality Date  . AV NODE ABLATION N/A 08/23/2017   Procedure: AV NODE ABLATION;  Surgeon: [name], MD;  Location: [facility];  Service: Cardiovascular;  Laterality: N/A;  . BACK SURGERY  2001, 2003, 2006   x 3  . CARDIOVERSION N/A 10/08/2014   Procedure: CARDIOVERSION;  Surgeon: [name], MD;  Location: [place];  Service: Cardiovascular;  Laterality: N/A;  . CARDIOVERSION N/A 10/15/2014   Procedure: CARDIOVERSION;  Surgeon: [name], MD;  Location: [facility] ENDOSCOPY;  Service: Cardiovascular;  Laterality: N/A;  . CARDIOVERSION N/A 11/15/2014   Procedure: CARDIOVERSION;  Surgeon: [name], MD;  Location: [place];  Service: Cardiovascular;  Laterality: N/A;  . PACEMAKER IMPLANT N/A 08/23/2017   Procedure: PACEMAKER IMPLANT;  Surgeon: [name], MD;  Location: [place] [facility];  Service: Cardiovascular;  Laterality: N/A;  . 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 TEE WITHOUT CARDIOVERSION N/A 10/08/2014   Procedure: TRANSESOPHAGEAL ECHOCARDIOGRAM (TEE);  Surgeon: [name], MD;  Location: [place];  Service: Cardiovascular;  Laterality: N/A;  . TUBAL LIGATION       OB History   None      Home Medications    Prior to Admission medications   Medication  Sig Start Date End Date Taking? Authorizing Provider  albuterol (PROVENTIL HFA;VENTOLIN HFA) 108 (90 BASE) MCG/ACT inhaler Inhale 2 puffs into the lungs 3 (three) times daily as needed for wheezing or shortness of breath.     [provider]  atorvastatin (LIPITOR) 40 MG tablet Take 40 mg by mouth at bedtime.     [provider]  Calcium Carbonate-Vitamin D (OSCAL 500/200 D-3 PO) Take 1 tablet by mouth daily.     [provider]  ELIQUIS 5 MG TABS tablet TAKE ONE  TABLET TWICE DAILY 11/08/16   Sherran Needs, NP  fluticasone [facility]) 50 MCG/ACT nasal spray Place 1 spray into both nostrils daily as needed for allergies.  11/02/14   [provider]  furosemide (LASIX) 80 MG tablet TAKE ONE TABLET EACH DAY Patient taking differently: TAKE ONE TABLET (80mg ) EACH DAY 04/26/17   Sherran Needs, NP  levothyroxine (SYNTHROID, LEVOTHROID) 100 MCG tablet Take 100 mcg by mouth daily before breakfast.  10/28/14   [provider]  LORazepam (ATIVAN) 1 MG tablet Take 1 mg by mouth 3 (three) times daily as needed for anxiety.     [provider]  metFORMIN (GLUCOPHAGE-XR) 500 MG 24 hr tablet Take 1,000 mg by mouth 2 (two) times daily.     [provider]  traMADol (ULTRAM) 50 MG tablet Take 50 mg by mouth every 8 (eight) hours as needed for moderate pain.     [provider]  vitamin E (VITAMIN E) 400 UNIT capsule Take 400 Units by mouth daily.    [provider]    Family History Family History  Problem Relation Age of Onset  . Pneumonia Mother   . Heart attack Father   . Diabetes Sister     Social History Social History   Tobacco Use  . Smoking status: Never Smoker  . Smokeless tobacco: Never Used  Substance Use Topics  . Alcohol use: No  . Drug use: No     Allergies   Pradaxa [dabigatran etexilate mesylate]; Nsaids; Sulfa antibiotics; and Betadine [povidone iodine]   Review of Systems Review of Systems  Respiratory: Positive for cough and shortness of breath.   Gastrointestinal: Positive for vomiting.  Neurological: Positive for weakness.  All other systems reviewed and are negative.    Physical Exam Updated Vital Signs BP (!) 179/69 (BP Location: Right Arm)   Pulse 72   Temp 99 F (37.2 C) (Oral)   Resp 16   SpO2 95%   Physical Exam  Constitutional: She appears well-developed and well-nourished.  HENT:  Head: Normocephalic and atraumatic.  Neck: Normal range of motion.    Cardiovascular: Normal rate and regular rhythm.  Pulmonary/Chest: No stridor. No respiratory distress. She has decreased breath sounds. She has rales.  Abdominal: Soft. She exhibits no distension.  Musculoskeletal:       Right lower leg: She exhibits edema.       Left lower leg: She exhibits edema.  Neurological: She is alert.  Skin: Skin is warm and dry.  Nursing note and vitals reviewed.    ED Treatments / Results  Labs (all labs ordered are listed, but only abnormal results are displayed) Labs Reviewed  CBC WITH DIFFERENTIAL/PLATELET - Abnormal; Notable for the following components:      Result Value   RBC 3.39 (*)    Hemoglobin 8.0 (*)    HCT 26.4 (*)    MCV 77.9 (*)    MCH 23.6 (*)  RDW 16.9 (*)    Neutro Abs 8.4 (*)    Lymphs Abs 0.5 (*)    All other components within normal limits  COMPREHENSIVE METABOLIC PANEL  TROPONIN I  BRAIN NATRIURETIC PEPTIDE  URINALYSIS, ROUTINE W REFLEX MICROSCOPIC    EKG None  Radiology Dg Chest 2 View  Result Date: 10/31/2017 CLINICAL DATA:  Shortness of breath EXAM: CHEST - 2 VIEW COMPARISON:  08/24/2017, CT chest 08/19/2017, radiograph 10/14/2014 FINDINGS: Left-sided pacing device as before. Marked cardiomegaly with globular cardiac configuration. Vascular congestion and mild diffuse interstitial opacity consistent with minimal edema. No significant effusion. Aortic atherosclerosis. No pneumothorax. IMPRESSION: Cardiomegaly with globular cardiac configuration, component of pericardial effusion cannot be ruled out. There is vascular congestion and mild interstitial edema. Electronically Signed   By: Donavan Foil M.D.   On: 10/31/2017 22:32    Procedures Procedures (including critical care time)  Medications Ordered in ED Medications - No data to display   Initial Impression / Assessment and Plan / ED Course  I have reviewed the triage vital signs and the nursing notes.  Pertinent labs & imaging results that were available  during my care of the patient were reviewed by me and considered in my medical decision making (see chart for details).    84 year old female here with multiple complaints was seen the most pertinent is that she has been have a progressively worsening cough and shortness of breath.  Worse with laying flat.  She also has some nausea and it seems to be coming after cough and has had vomiting a couple of times well.  Exam consistent with likely heart failure.  Workup c/w same. Significant tachypnea, will consult cardiology for admission.     Final Clinical Impressions(s) / ED Diagnoses   Final diagnoses:  None    ED Discharge Orders    None       Cherylyn Sundby, Corene Cornea, MD 11/01/17 [phone]

## 2017-10-31 NOTE — ED Triage Notes (Signed)
Pt to ED via EMS from home c/o n/v with weakness and sob. Pt vomited x 2 today. Pt also reports small amount of blood in stool and vaginal pain. Pt has hx of CHF and sts has demand pacemaker, which EMS reports is firing 100%. Pt denies all pain. NAD

## 2017-11-01 ENCOUNTER — Encounter (HOSPITAL_COMMUNITY): Payer: Self-pay

## 2017-11-01 ENCOUNTER — Inpatient Hospital Stay (HOSPITAL_COMMUNITY): Payer: Medicare Other

## 2017-11-01 DIAGNOSIS — I5033 Acute on chronic diastolic (congestive) heart failure: Secondary | ICD-10-CM

## 2017-11-01 DIAGNOSIS — E871 Hypo-osmolality and hyponatremia: Secondary | ICD-10-CM | POA: Diagnosis present

## 2017-11-01 DIAGNOSIS — K648 Other hemorrhoids: Secondary | ICD-10-CM | POA: Diagnosis not present

## 2017-11-01 DIAGNOSIS — K921 Melena: Secondary | ICD-10-CM | POA: Diagnosis not present

## 2017-11-01 DIAGNOSIS — I361 Nonrheumatic tricuspid (valve) insufficiency: Secondary | ICD-10-CM | POA: Diagnosis not present

## 2017-11-01 DIAGNOSIS — Z79899 Other long term (current) drug therapy: Secondary | ICD-10-CM | POA: Diagnosis not present

## 2017-11-01 DIAGNOSIS — K2951 Unspecified chronic gastritis with bleeding: Secondary | ICD-10-CM | POA: Diagnosis not present

## 2017-11-01 DIAGNOSIS — Z886 Allergy status to analgesic agent status: Secondary | ICD-10-CM | POA: Diagnosis not present

## 2017-11-01 DIAGNOSIS — D500 Iron deficiency anemia secondary to blood loss (chronic): Secondary | ICD-10-CM | POA: Diagnosis not present

## 2017-11-01 DIAGNOSIS — K2971 Gastritis, unspecified, with bleeding: Secondary | ICD-10-CM | POA: Diagnosis not present

## 2017-11-01 DIAGNOSIS — Z950 Presence of cardiac pacemaker: Secondary | ICD-10-CM | POA: Diagnosis not present

## 2017-11-01 DIAGNOSIS — I480 Paroxysmal atrial fibrillation: Secondary | ICD-10-CM | POA: Diagnosis not present

## 2017-11-01 DIAGNOSIS — I130 Hypertensive heart and chronic kidney disease with heart failure and stage 1 through stage 4 chronic kidney disease, or unspecified chronic kidney disease: Secondary | ICD-10-CM | POA: Diagnosis not present

## 2017-11-01 DIAGNOSIS — R0602 Shortness of breath: Secondary | ICD-10-CM | POA: Diagnosis not present

## 2017-11-01 DIAGNOSIS — J189 Pneumonia, unspecified organism: Secondary | ICD-10-CM | POA: Diagnosis not present

## 2017-11-01 DIAGNOSIS — D508 Other iron deficiency anemias: Secondary | ICD-10-CM | POA: Diagnosis not present

## 2017-11-01 DIAGNOSIS — M810 Age-related osteoporosis without current pathological fracture: Secondary | ICD-10-CM | POA: Diagnosis present

## 2017-11-01 DIAGNOSIS — Z7951 Long term (current) use of inhaled steroids: Secondary | ICD-10-CM | POA: Diagnosis not present

## 2017-11-01 DIAGNOSIS — E1122 Type 2 diabetes mellitus with diabetic chronic kidney disease: Secondary | ICD-10-CM | POA: Diagnosis present

## 2017-11-01 DIAGNOSIS — E785 Hyperlipidemia, unspecified: Secondary | ICD-10-CM | POA: Diagnosis present

## 2017-11-01 DIAGNOSIS — K5730 Diverticulosis of large intestine without perforation or abscess without bleeding: Secondary | ICD-10-CM | POA: Diagnosis not present

## 2017-11-01 DIAGNOSIS — J849 Interstitial pulmonary disease, unspecified: Secondary | ICD-10-CM | POA: Diagnosis not present

## 2017-11-01 DIAGNOSIS — Z7901 Long term (current) use of anticoagulants: Secondary | ICD-10-CM | POA: Diagnosis not present

## 2017-11-01 DIAGNOSIS — D509 Iron deficiency anemia, unspecified: Secondary | ICD-10-CM | POA: Diagnosis not present

## 2017-11-01 DIAGNOSIS — D125 Benign neoplasm of sigmoid colon: Secondary | ICD-10-CM | POA: Diagnosis not present

## 2017-11-01 DIAGNOSIS — Z7984 Long term (current) use of oral hypoglycemic drugs: Secondary | ICD-10-CM | POA: Diagnosis not present

## 2017-11-01 DIAGNOSIS — E039 Hypothyroidism, unspecified: Secondary | ICD-10-CM | POA: Diagnosis present

## 2017-11-01 DIAGNOSIS — Z888 Allergy status to other drugs, medicaments and biological substances status: Secondary | ICD-10-CM | POA: Diagnosis not present

## 2017-11-01 DIAGNOSIS — Z882 Allergy status to sulfonamides status: Secondary | ICD-10-CM | POA: Diagnosis not present

## 2017-11-01 DIAGNOSIS — I482 Chronic atrial fibrillation: Secondary | ICD-10-CM | POA: Diagnosis not present

## 2017-11-01 DIAGNOSIS — N183 Chronic kidney disease, stage 3 (moderate): Secondary | ICD-10-CM | POA: Diagnosis present

## 2017-11-01 DIAGNOSIS — K635 Polyp of colon: Secondary | ICD-10-CM | POA: Diagnosis not present

## 2017-11-01 DIAGNOSIS — K2970 Gastritis, unspecified, without bleeding: Secondary | ICD-10-CM | POA: Diagnosis not present

## 2017-11-01 LAB — URINALYSIS, ROUTINE W REFLEX MICROSCOPIC
Bilirubin Urine: NEGATIVE
Bilirubin Urine: NEGATIVE
Glucose, UA: NEGATIVE mg/dL
Glucose, UA: NEGATIVE mg/dL
Ketones, ur: NEGATIVE mg/dL
Ketones, ur: NEGATIVE mg/dL
Nitrite: NEGATIVE
Nitrite: NEGATIVE
Protein, ur: 30 mg/dL — AB
Protein, ur: 30 mg/dL — AB
Specific Gravity, Urine: 1.011 (ref 1.005–1.030)
Specific Gravity, Urine: 1.008 (ref 1.005–1.030)
WBC, UA: >50 WBC/hpf — ABNORMAL HIGH (ref 0–5)
pH: 5 (ref 5.0–8.0)
pH: 6 (ref 5.0–8.0)

## 2017-11-01 LAB — BASIC METABOLIC PANEL
Anion gap: 15 (ref 5–15)
BUN: 23 mg/dL (ref 8–23)
CO2: 26 mmol/L (ref 22–32)
Calcium: 9.4 mg/dL (ref 8.9–10.3)
Chloride: 97 mmol/L — ABNORMAL LOW (ref 98–111)
Creatinine, Ser: 1.28 mg/dL — ABNORMAL HIGH (ref 0.44–1.00)
GFR calc Af Amer: 44 mL/min — ABNORMAL LOW (ref >60)
GFR calc non Af Amer: 38 mL/min — ABNORMAL LOW (ref >60)
Glucose, Bld: 183 mg/dL — ABNORMAL HIGH (ref 70–99)
Potassium: 3.8 mmol/L (ref 3.5–5.1)
Sodium: 138 mmol/L (ref 135–145)

## 2017-11-01 LAB — ECHOCARDIOGRAM COMPLETE
Height: 62 in
Weight: 2564.39 oz

## 2017-11-01 LAB — TROPONIN I
Troponin I: 0.03 ng/mL (ref <0.03)
Troponin I: 0.03 ng/mL (ref <0.03)
Troponin I: 0.03 ng/mL (ref <0.03)

## 2017-11-01 LAB — CBC
HCT: 26.8 % — ABNORMAL LOW (ref 36.0–46.0)
Hemoglobin: 8 g/dL — ABNORMAL LOW (ref 12.0–15.0)
MCH: 23.1 pg — ABNORMAL LOW (ref 26.0–34.0)
MCHC: 29.9 g/dL — ABNORMAL LOW (ref 30.0–36.0)
MCV: 77.2 fL — ABNORMAL LOW (ref 78.0–100.0)
Platelets: 174 10*3/uL (ref 150–400)
RBC: 3.47 MIL/uL — ABNORMAL LOW (ref 3.87–5.11)
RDW: 17 % — ABNORMAL HIGH (ref 11.5–15.5)
WBC: 11.2 10*3/uL — ABNORMAL HIGH (ref 4.0–10.5)

## 2017-11-01 LAB — MRSA PCR SCREENING: MRSA by PCR: NEGATIVE

## 2017-11-01 MED ORDER — SODIUM CHLORIDE 0.9% FLUSH
3.0000 mL | Freq: Two times a day (BID) | INTRAVENOUS | Status: DC
  Administered 2017-11-01 – 2017-11-07 (×12): 3 mL via INTRAVENOUS

## 2017-11-01 MED ORDER — FUROSEMIDE 10 MG/ML IJ SOLN
80.0000 mg | Freq: Once | INTRAMUSCULAR | Status: AC
  Administered 2017-11-01: 80 mg via INTRAVENOUS
  Filled 2017-11-01: qty 8

## 2017-11-01 MED ORDER — LORAZEPAM 1 MG PO TABS
1.0000 mg | ORAL_TABLET | Freq: Three times a day (TID) | ORAL | Status: DC | PRN
  Administered 2017-11-03 – 2017-11-06 (×2): 1 mg via ORAL
  Filled 2017-11-01 (×2): qty 1

## 2017-11-01 MED ORDER — TRAMADOL HCL 50 MG PO TABS
50.0000 mg | ORAL_TABLET | Freq: Three times a day (TID) | ORAL | Status: DC | PRN
  Administered 2017-11-03: 50 mg via ORAL
  Filled 2017-11-01: qty 1

## 2017-11-01 MED ORDER — SODIUM CHLORIDE 0.9 % IV SOLN
2.0000 g | INTRAVENOUS | Status: DC
  Administered 2017-11-01 – 2017-11-02 (×2): 2 g via INTRAVENOUS
  Filled 2017-11-01 (×2): qty 20

## 2017-11-01 MED ORDER — APIXABAN 5 MG PO TABS
5.0000 mg | ORAL_TABLET | Freq: Two times a day (BID) | ORAL | Status: DC
  Administered 2017-11-01 – 2017-11-02 (×4): 5 mg via ORAL
  Filled 2017-11-01 (×5): qty 1

## 2017-11-01 MED ORDER — ALBUTEROL SULFATE (2.5 MG/3ML) 0.083% IN NEBU
2.5000 mg | INHALATION_SOLUTION | Freq: Three times a day (TID) | RESPIRATORY_TRACT | Status: DC | PRN
  Administered 2017-11-01 – 2017-11-05 (×4): 2.5 mg via RESPIRATORY_TRACT
  Filled 2017-11-01 (×4): qty 3

## 2017-11-01 MED ORDER — ONDANSETRON HCL 4 MG/2ML IJ SOLN
4.0000 mg | Freq: Four times a day (QID) | INTRAMUSCULAR | Status: DC | PRN
Start: 2017-11-01 — End: 2017-11-07
  Administered 2017-11-01: 4 mg via INTRAVENOUS
  Filled 2017-11-01: qty 2

## 2017-11-01 MED ORDER — ACETAMINOPHEN 325 MG PO TABS
650.0000 mg | ORAL_TABLET | ORAL | Status: DC | PRN
Start: 2017-11-01 — End: 2017-11-07
  Administered 2017-11-01: 650 mg via ORAL
  Filled 2017-11-01: qty 2

## 2017-11-01 MED ORDER — SODIUM CHLORIDE 0.9 % IV SOLN
250.0000 mL | INTRAVENOUS | Status: DC | PRN
  Administered 2017-11-01: 250 mL via INTRAVENOUS

## 2017-11-01 MED ORDER — ATORVASTATIN CALCIUM 40 MG PO TABS
40.0000 mg | ORAL_TABLET | Freq: Every day | ORAL | Status: DC
  Administered 2017-11-01 – 2017-11-06 (×7): 40 mg via ORAL
  Filled 2017-11-01 (×6): qty 2
  Filled 2017-11-01: qty 1

## 2017-11-01 MED ORDER — METFORMIN HCL ER 500 MG PO TB24
1000.0000 mg | ORAL_TABLET | Freq: Two times a day (BID) | ORAL | Status: DC
  Administered 2017-11-01 – 2017-11-07 (×12): 1000 mg via ORAL
  Filled 2017-11-01 (×14): qty 2

## 2017-11-01 MED ORDER — SODIUM CHLORIDE 0.9% FLUSH
3.0000 mL | INTRAVENOUS | Status: DC | PRN
  Administered 2017-11-06: 3 mL via INTRAVENOUS
  Filled 2017-11-01: qty 3

## 2017-11-01 MED ORDER — SODIUM CHLORIDE 0.9 % IV SOLN
500.0000 mg | INTRAVENOUS | Status: DC
  Administered 2017-11-01 – 2017-11-02 (×2): 500 mg via INTRAVENOUS
  Filled 2017-11-01 (×2): qty 500

## 2017-11-01 MED ORDER — VITAMIN E 180 MG (400 UNIT) PO CAPS
400.0000 [IU] | ORAL_CAPSULE | Freq: Every day | ORAL | Status: DC
  Administered 2017-11-01 – 2017-11-07 (×7): 400 [IU] via ORAL
  Filled 2017-11-01 (×7): qty 1

## 2017-11-01 MED ORDER — LEVOTHYROXINE SODIUM 100 MCG PO TABS
100.0000 ug | ORAL_TABLET | Freq: Every day | ORAL | Status: DC
  Administered 2017-11-01 – 2017-11-07 (×7): 100 ug via ORAL
  Filled 2017-11-01 (×7): qty 1

## 2017-11-01 MED ORDER — FUROSEMIDE 10 MG/ML IJ SOLN
80.0000 mg | Freq: Two times a day (BID) | INTRAMUSCULAR | Status: DC
  Administered 2017-11-01 – 2017-11-03 (×5): 80 mg via INTRAVENOUS
  Filled 2017-11-01 (×5): qty 8

## 2017-11-01 NOTE — Progress Notes (Signed)
Progress Note  Patient Name: Ann Fletcher Date of Encounter: 11/01/2017  Primary Cardiologist:   No primary care provider on file.   Subjective   No chest pain.  Nausea, cough and wheezing.    Inpatient Medications    Scheduled Meds: . apixaban  5 mg Oral BID  . atorvastatin  40 mg Oral QHS  . levothyroxine  100 mcg Oral QAC breakfast  . metFORMIN  1,000 mg Oral BID WC  . sodium chloride flush  3 mL Intravenous Q12H  . vitamin E  400 Units Oral Daily   Continuous Infusions: . sodium chloride     PRN Meds: sodium chloride, acetaminophen, albuterol, LORazepam, ondansetron (ZOFRAN) IV, sodium chloride flush, traMADol   Vital Signs    Vitals:   11/01/17 0159 11/01/17 0325 11/01/17 0800 11/01/17 0823  BP: 140/79 (!) 174/68 (!) 138/54 (!) 154/62  Pulse: 71 71 70 69  Resp: 17 (!) 29 20 19   Temp: 99 F (37.2 C) 98.4 F (36.9 C)  98.4 F (36.9 C)  TempSrc: Oral Oral  Oral  SpO2: 95% 97% 94% 94%  Weight:  160 lb 4.4 oz (72.7 kg)    Height:  5\' 2"  (1.575 m)      Intake/Output Summary (Last 24 hours) at 11/01/2017 0835 Last data filed at 11/01/2017 0700 Gross per 24 hour  Intake 0 ml  Output -  Net 0 ml   Filed Weights   11/01/17 0325  Weight: 160 lb 4.4 oz (72.7 kg)    Telemetry   Atrial rhythm with ventricular pacing- Personally Reviewed  ECG    NA - Personally Reviewed  Physical Exam   GEN: No acute distress.   Neck: No  JVD Cardiac: Regular RR, no murmurs, rubs, or gallops.  Respiratory: Wheezing and decreased breath sounds GI: Soft, nontender, non-distended  MS: No  edema; No deformity. Neuro:  Nonfocal  Psych: Normal affect   Labs    Chemistry Recent Labs  Lab 10/31/17 2155  NA 139  K 4.2  CL 101  CO2 23  GLUCOSE 195*  BUN 24*  CREATININE 1.33*  CALCIUM 9.6  PROT 7.2  ALBUMIN 3.9  AST 26  ALT 14  ALKPHOS 51  BILITOT 1.0  GFRNONAA 36*  GFRAA 42*  ANIONGAP 15     Hematology Recent Labs  Lab 10/31/17 2155  WBC 10.1  RBC  3.39*  HGB 8.0*  HCT 26.4*  MCV 77.9*  MCH 23.6*  MCHC 30.3  RDW 16.9*  PLT 171    Cardiac Enzymes Recent Labs  Lab 10/31/17 2155 11/01/17 0300  TROPONINI 0.03* 0.03*   No results for input(s): TROPIPOC in the last 168 hours.   BNP Recent Labs  Lab 10/31/17 2155  BNP 582.0*     DDimer No results for input(s): DDIMER in the last 168 hours.   Radiology    Dg Chest 2 View  Result Date: 10/31/2017 CLINICAL DATA:  Shortness of breath EXAM: CHEST - 2 VIEW COMPARISON:  08/24/2017, CT chest 08/19/2017, radiograph 10/14/2014 FINDINGS: Left-sided pacing device as before. Marked cardiomegaly with globular cardiac configuration. Vascular congestion and mild diffuse interstitial opacity consistent with minimal edema. No significant effusion. Aortic atherosclerosis. No pneumothorax. IMPRESSION: Cardiomegaly with globular cardiac configuration, component of pericardial effusion cannot be ruled out. There is vascular congestion and mild interstitial edema. Electronically Signed   By: Donavan Foil M.D.   On: 10/31/2017 22:32    Cardiac Studies   NA  Patient Profile  84 y.o. 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   Assessment & Plan    CKD III:   Creat is pending this morning.  PAF (AV node ablation):   Continue anticoagulation.    ACUTE ON CHRONIC DIASTOLIC HF:    I will give 80 mg IV Lasix bid today then needs to be reassessed in the AM.  CXR reviewed.  If she does not improve over the weekend consider antibiotics.      TROPONIN ELEVATION:  Non diagnostic.    For questions or updates, please contact [place] Please consult [email or web address] for contact  info under Cardiology/STEMI.   Signed, Minus Breeding, MD  11/01/2017, 8:35 AM

## 2017-11-01 NOTE — Progress Notes (Signed)
  Echocardiogram 2D Echocardiogram has been performed.  Ann Fletcher 11/01/2017, 11:02 AM

## 2017-11-01 NOTE — Progress Notes (Signed)
Pt complaining of nausea. Vomited a very small amount, mostly her lunch. Gave zofran per order. Will continue to monitor.

## 2017-11-01 NOTE — ED Notes (Signed)
During ambulation, Pts SPO2 was 95% at initial stand and maintained at 99% for rest of walk to end of nurses station.  Pt walked quickly with some stumbling steps

## 2017-11-01 NOTE — Progress Notes (Signed)
Patient running fever of 100.6. Tylenol given. Pt now slightly confused and also requiring 2L o2 to maintain sats above 90%.  Current vitals: HR vpaced 73 bp 168/68 RR 20 o2 97% pt on 2 L.  Paged MD. Orders received. Will continue to monitor.

## 2017-11-01 NOTE — Progress Notes (Signed)
CXR and labs reviewed. Ordered ABX for suspected PNA and flutter valve. Suspect aspiration after she vomited.

## 2017-11-01 NOTE — Care Management Note (Addendum)
Case Management Note  Patient Details  Name: TYNESIA HARRAL MRN: [id number] Date of Birth: 26-Nov-1932  Subjective/Objective:      Pt admitted with SOB              Action/Plan:   PTA from home with husband.  Pt is not on oxygen at home, has an aid supplied by [facility]  paid out of medicaid - pt is unsure of the number of hours per day.  CM contacted [place] and agency verified that pt has PCS services provided by [employer].  CM attempted to call [employer] however was unable to reach anyone or leave a message.   Pt educated on importance of daily weights and low salt diet.  PT eval ordered.     Expected Discharge Date:  11/05/17               Expected Discharge Plan:     In-House Referral:  Clinical Social Work  Discharge planning Services  CM Consult  Post Acute Care Choice:    Choice offered to:     DME Arranged:    DME Agency:     HH Arranged:    HH Agency:     Status of Service:     If discussed at [employer], dates discussed:    Additional Comments:  Maryclare Labrador, RN 11/01/2017, 3:09 PM

## 2017-11-01 NOTE — Plan of Care (Signed)

## 2017-11-01 NOTE — H&P (Signed)
Cardiology Admission History and Physical:   Patient ID: Ann Fletcher; MRN: [id number]; DOB: [birth year]   Admission date: 10/31/2017  Primary Care Provider: [name], MD Primary Cardiologist:  [name]  Chief Complaint: Shortness of breath  History of Present Illness:   Ann Fletcher is a 84 y.o. 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 Patient reports having generalized weakness along with nausea. She vomited twice today. She denies any fevers or chills. She does not have any productive cough. There is no wheezing. She has been noticing bilateral leg swelling. She denies any fevers or chills. She does not have palpitations, presyncope or syncope.   In the ED she was treated with IV Lasix. She was also transiently on supplemental oxygen.   Echocardiogram 08/16/2017 - Left ventricle: The cavity size was normal. There was mild   concentric hypertrophy. Systolic function was normal. The   estimated ejection fraction was in the range of 55% to 60%. Wall   motion was normal; there were no regional wall motion   abnormalities. - Left atrium: The atrium was severely dilated. - Right ventricle: The cavity size was mildly dilated. Wall   thickness was normal. - Right atrium: The atrium was moderately dilated. - Tricuspid valve: There was moderate-severe regurgitation directed   centrally. - Pulmonary arteries: Systolic pressure was mildly increased. PA   peak pressure: 38 mm Hg (S).     Past Medical History:  Diagnosis Date  . Anxiety   . Arthritis   . AV node arrhythmia    a. s/p AV node ablation/PPM 07/2017.  [name] Chronic diastolic CHF (congestive heart  failure) ([place])    a. Acute exacerbation occurred in setting of AF.  [name] CKD (chronic kidney disease), stage III ([place])   . Diabetes mellitus without complication ([place])    type 2  . H/O bladder infections   . Hyperlipidemia   . Hypertension   . Hypothyroidism   . Lower extremity edema   . Osteoporosis   . Persistent atrial fibrillation ([place])    a. diagnosed in 10/2014 by PCP, underwent failed TEE DCCV on 10/08/14, loaded with amio, successfully DCCV on 10/15/14.    Past Surgical History:  Procedure Laterality Date  . AV NODE ABLATION N/A 08/23/2017   Procedure: AV NODE ABLATION;  Surgeon: [name], MD;  Location: [place] [facility];  Service: Cardiovascular;  Laterality: N/A;  . BACK SURGERY  2001, 2003, 2006   x 3  . CARDIOVERSION N/A 10/08/2014   Procedure: CARDIOVERSION;  Surgeon: [name], MD;  Location: [facility];  Service: Cardiovascular;  Laterality: N/A;  . CARDIOVERSION N/A 10/15/2014   Procedure: CARDIOVERSION;  Surgeon: [name], MD;  Location: [facility]OSCOPY;  Service: Cardiovascular;  Laterality: N/A;  . CARDIOVERSION N/A 11/15/2014   Procedure: CARDIOVERSION;  Surgeon: [name], MD;  Location: [place];  Service: Cardiovascular;  Laterality: N/A;  . PACEMAKER IMPLANT N/A 08/23/2017   Procedure: PACEMAKER IMPLANT;  Surgeon: [name], MD;  Location: [place] [facility];  Service: Cardiovascular;  Laterality: N/A;  . 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 TEE WITHOUT CARDIOVERSION N/A 10/08/2014   Procedure: TRANSESOPHAGEAL ECHOCARDIOGRAM (TEE);  Surgeon: [name], MD;  Location: [place];  Service: Cardiovascular;  Laterality: N/A;  . TUBAL LIGATION       Medications Prior to Admission: Prior to Admission medications   Medication Sig Start Date End Date Taking? Authorizing Provider  albuterol (PROVENTIL HFA;VENTOLIN HFA) 108 (90 BASE) MCG/ACT inhaler Inhale 2 puffs into the lungs 3 (three) times daily as needed for wheezing or  shortness of breath.     [provider]  atorvastatin (LIPITOR) 40 MG tablet Take 40 mg by mouth at bedtime.     [provider]  Calcium Carbonate-Vitamin D (OSCAL 500/200 D-3 PO) Take 1 tablet by mouth daily.     [provider]  ELIQUIS 5 MG TABS tablet TAKE ONE TABLET TWICE DAILY 11/08/16   Sherran Needs, NP  fluticasone [facility]) 50 MCG/ACT nasal spray Place 1 spray into both nostrils daily as needed for allergies.  11/02/14   [provider]  furosemide (LASIX) 80 MG tablet TAKE ONE TABLET EACH DAY Patient taking differently: TAKE ONE TABLET (80mg ) EACH DAY 04/26/17   Sherran Needs, NP  levothyroxine (SYNTHROID, LEVOTHROID) 100 MCG tablet Take 100 mcg by mouth daily before breakfast.  10/28/14   [provider]  LORazepam (ATIVAN) 1 MG tablet Take 1 mg by mouth 3 (three) times daily as needed for anxiety.     [provider]  metFORMIN (GLUCOPHAGE-XR) 500 MG 24 hr tablet Take 1,000 mg by mouth 2 (two) times daily.     [provider]  traMADol (ULTRAM) 50 MG tablet Take 50 mg by mouth every 8 (eight) hours as needed for moderate pain.     [provider]  vitamin E (VITAMIN E) 400 UNIT capsule Take 400 Units by mouth daily.    [provider]     Allergies:    Allergies  Allergen Reactions  . Pradaxa [Dabigatran Etexilate Mesylate] Nausea And Vomiting  . Nsaids Other (See Comments)    Stomach cramps  . Sulfa Antibiotics Nausea And Vomiting and Other (See Comments)    GI upset  . Betadine [Povidone Iodine] Itching    Social History:   Social History   Socioeconomic History  . Marital status: Married    Spouse name: Not on file  . Number of children: Not on file  . Years of education: Not on file  . Highest education level: Not on file  Occupational History  . Not on file  Social Needs  . Financial resource strain: Not on file  . Food insecurity:    Worry: Not on file    Inability: Not on  file  . Transportation needs:    Medical: Not on file    Non-medical: Not on file  Tobacco Use  . Smoking status: Never Smoker  . Smokeless tobacco: Never Used  Substance and Sexual Activity  . Alcohol use: No  . Drug use: No  . Sexual activity: Yes    Birth control/protection: Surgical  Lifestyle  . Physical activity:    Days per week: Not on file    Minutes per session: Not on file  . Stress: Not on file  Relationships  . Social connections:    Talks on phone: Not on file    Gets together: Not on file    Attends religious service: Not on file    Active member of club or organization: Not on file    Attends meetings of clubs or organizations: Not on file    Relationship status: Not on file  . Intimate partner violence:  Fear of current or ex partner: Not on file    Emotionally abused: Not on file    Physically abused: Not on file    Forced sexual activity: Not on file  Other Topics Concern  . Not on file  Social History Narrative  . Not on file    Family History:   The patient's family history includes Diabetes in her sister; Heart attack in her father; Pneumonia in her mother.    Review of Systems: [y] = yes, [ ]  = no   . General: Weight gain [ ] ; Weight loss [ ] ; Anorexia [ ] ; Fatigue [ ] ; Fever [ ] ; Chills [ ] ; Weakness [ ]   . Cardiac: Chest pain/pressure [ ] ; Resting SOB Blue.Reese ]; Exertional SOB Blue.Reese ]; Orthopnea [ ] ; Pedal Edema Blue.Reese ]; Palpitations [ ] ; Syncope [ ] ; Presyncope [ ] ; Paroxysmal nocturnal dyspnea[ ]   . Pulmonary: Cough [ ] ; Wheezing[ ] ; Hemoptysis[ ] ; Sputum [ ] ; Snoring [ ]   . GI: Vomiting[ ] ; Dysphagia[ ] ; Melena[ ] ; Hematochezia [ ] ; Heartburn[ ] ; Abdominal pain [ ] ; Constipation [ ] ; Diarrhea [ ] ; BRBPR [ ]   . GU: Hematuria[ ] ; Dysuria [ ] ; Nocturia[ ]   . Vascular: Pain in legs with walking [ ] ; Pain in feet with lying flat [ ] ; Non-healing sores [ ] ; Stroke [ ] ; TIA [ ] ; Slurred speech [ ] ;  . Neuro: Headaches[ ] ; Vertigo[ ] ; Seizures[ ] ;  Paresthesias[ ] ;Blurred vision [ ] ; Diplopia [ ] ; Vision changes [ ]   . Ortho/Skin: Arthritis [ ] ; Joint pain [ ] ; Muscle pain [ ] ; Joint swelling [ ] ; Back Pain [ ] ; Rash [ ]   . Psych: Depression[ ] ; Anxiety[ ]   . Heme: Bleeding problems [ ] ; Clotting disorders [ ] ; Anemia [ ]   . Endocrine: Diabetes [ ] ; Thyroid dysfunction[ ]     Physical Exam/Data:   Vitals:   10/31/17 2105 10/31/17 2113 10/31/17 2204  BP:  (!) 179/69   Pulse:  72   Resp:  16   Temp:  99 F (37.2 C)   TempSrc:  Oral   SpO2: 97% 96% 95%   No intake or output data in the 24 hours ending 11/01/17 0129 There were no vitals filed for this visit. There is no height or weight on file to calculate BMI.  General:  Well nourished, well developed, mildly tachypneic HEENT: normal Lymph: no adenopathy Neck: no JVD Endocrine:  No thryomegaly Vascular: No carotid bruits; FA pulses 2+ bilaterally without bruits  Cardiac:  normal S1, S2; RRR; no murmur  Lungs:  clear to auscultation bilaterally, no wheezing, crackles are present bilaterally  Abd: soft, nontender, no hepatomegaly  Ext: mild edema Musculoskeletal:  No deformities, BUE and BLE strength normal and equal Skin: warm and dry  Neuro:  CNs 2-12 intact, no focal abnormalities noted Psych:  Normal affect    EKG:  Ventricular paced rhythm, rate 70 bpm  Laboratory Data:  Chemistry Recent Labs  Lab 10/31/17 2155  NA 139  K 4.2  CL 101  CO2 23  GLUCOSE 195*  BUN 24*  CREATININE 1.33*  CALCIUM 9.6  GFRNONAA 36*  GFRAA 42*  ANIONGAP 15    Recent Labs  Lab 10/31/17 2155  PROT 7.2  ALBUMIN 3.9  AST 26  ALT 14  ALKPHOS 51  BILITOT 1.0   Hematology Recent Labs  Lab 10/31/17 2155  WBC 10.1  RBC 3.39*  HGB 8.0*  HCT 26.4*  MCV 77.9*  MCH 23.6*  MCHC  30.3  RDW 16.9*  PLT 171   Cardiac Enzymes Recent Labs  Lab 10/31/17 2155  TROPONINI 0.03*   No results for input(s): TROPIPOC in the last 168 hours.  BNP Recent Labs  Lab 10/31/17 2155   BNP 582.0*    DDimer No results for input(s): DDIMER in the last 168 hours.  Radiology/Studies:  Dg Chest 2 View  Result Date: 10/31/2017 CLINICAL DATA:  Shortness of breath EXAM: CHEST - 2 VIEW COMPARISON:  08/24/2017, CT chest 08/19/2017, radiograph 10/14/2014 FINDINGS: Left-sided pacing device as before. Marked cardiomegaly with globular cardiac configuration. Vascular congestion and mild diffuse interstitial opacity consistent with minimal edema. No significant effusion. Aortic atherosclerosis. No pneumothorax. IMPRESSION: Cardiomegaly with globular cardiac configuration, component of pericardial effusion cannot be ruled out. There is vascular congestion and mild interstitial edema. Electronically Signed   By: Donavan Foil M.D.   On: 10/31/2017 22:32    Assessment and Plan:  Ann Fletcher is a 84 y.o. female with a history of atrial fibrillation (on Eliquis), status post Medtronic dual-chamber pacemaker placement after A-V node ablation, chronic diastolic heart failure, diabetes mellitus, hyperlipidemia, hypertension, hypothyroidism, lower extremity edema, and arthritis who presents with complaints of resting shortness of breath.  1. Acute on chronic diastolic heart failure The chest x-ray reveals vascular congestion. The BNP is 582 with a troponin of 0.03. She has bilateral crackles on auscultation along with 1+ edema of the lower legs. The extremities are warm to touch.  - Intravenous diuretics to keep 1 to 2 L negative over the next 24 hours - Monitor urine output, daily weights and serum creatinine - Continue diltiazem. Consider reintroducing metoprolol succinate once patient is diuresed. - Continue Eliquis due to her history of atrial fibrillation. She does have a history of anemia and reports some rectal bleeding recently. We'll continue to monitor her hemoglobin and hematocrit.   Severity of Illness: The appropriate patient status for this patient is INPATIENT. Inpatient status  is judged to be reasonable and necessary in order to provide the required intensity of service to ensure the patient's safety. The patient's presenting symptoms, physical exam findings, and initial radiographic and laboratory data in the context of their chronic comorbidities is felt to place them at high risk for further clinical deterioration. Furthermore, it is not anticipated that the patient will be medically stable for discharge from the hospital within 2 midnights of admission. The following factors support the patient status of inpatient.   " The patient's presenting symptoms include shortness of breath. " The worrisome physical exam findings include crackles on auscultation. " The initial radiographic and laboratory data are worrisome because of vascular congestion on chest x-ray. " The chronic co-morbidities include hypertension and hyperlipidemia.   * I certify that at the point of admission it is my clinical judgment that the patient will require inpatient hospital care spanning beyond 2 midnights from the point of admission due to high intensity of service, high risk for further deterioration and high frequency of surveillance required.*    For questions or updates, please contact [place] Please consult [email or web address] for contact info under Cardiology/STEMI.    Signed, Meade Maw, MD  11/01/2017 1:29 AM

## 2017-11-02 LAB — BASIC METABOLIC PANEL
Anion gap: 14 (ref 5–15)
BUN: 24 mg/dL — ABNORMAL HIGH (ref 8–23)
CO2: 30 mmol/L (ref 22–32)
Calcium: 8.9 mg/dL (ref 8.9–10.3)
Chloride: 97 mmol/L — ABNORMAL LOW (ref 98–111)
Creatinine, Ser: 1.36 mg/dL — ABNORMAL HIGH (ref 0.44–1.00)
GFR calc Af Amer: 40 mL/min — ABNORMAL LOW (ref >60)
GFR calc non Af Amer: 35 mL/min — ABNORMAL LOW (ref >60)
Glucose, Bld: 146 mg/dL — ABNORMAL HIGH (ref 70–99)
Potassium: 3.8 mmol/L (ref 3.5–5.1)
Sodium: 141 mmol/L (ref 135–145)

## 2017-11-02 NOTE — Evaluation (Signed)
Physical Therapy Evaluation Patient Details Name: Ann Fletcher MRN: [id number] DOB: 06/04/1932 Today's Date: 11/02/2017   History of Present Illness  Pt is an 84 y/o female admitted secondary to SOB and found to have PNA. PMH including but not limite to atrial fibrillation (on Eliquis), status post Medtronic dual-chamber pacemaker placement after A-V node ablation, chronic diastolic heart failure, diabetes mellitus, hyperlipidemia, hypertension, chronic anemia, stage III CKD, hypothyroidism.     Clinical Impression  Pt presented seated in recliner chair, awake and willing to participate in therapy session. Prior to admission, pt reported that she ambulated with use of RW. Pt lives with her spouse in a single level apartment with a level entry. Pt currently able to perform transfers with min guard and ambulate a significant distance (~500') with RW and min guard with all VSS throughout. PT will continue to follow acutely to progress mobility as tolerated and to ensure a safe d/c home.      Follow Up Recommendations No PT follow up    Equipment Recommendations  None recommended by PT    Recommendations for Other Services       Precautions / Restrictions Precautions Precautions: Fall Restrictions Weight Bearing Restrictions: No      Mobility  Bed Mobility               General bed mobility comments: pt OOB in recliner chair upon arrival  Transfers Overall transfer level: Needs assistance Equipment used: Rolling walker (2 wheeled) Transfers: Sit to/from Stand Sit to Stand: Min guard         General transfer comment: min guard for safety, cueing for safe hand placement  Ambulation/Gait Ambulation/Gait assistance: Min guard Gait Distance (Feet): 500 Feet Assistive device: Rolling walker (2 wheeled) Gait Pattern/deviations: Step-through pattern;Drifts right/left Gait velocity: decreased Gait velocity interpretation: 1.31 - 2.62 ft/sec, indicative of limited community  ambulator General Gait Details: pt with mild instability but no overt LOB or need for physical assistance, min guard for safety  Stairs            Wheelchair Mobility    Modified Rankin (Stroke Patients Only)       Balance Overall balance assessment: Needs assistance Sitting-balance support: Feet supported Sitting balance-Leahy Scale: Good     Standing balance support: During functional activity;Bilateral upper extremity supported Standing balance-Leahy Scale: Poor                               Pertinent Vitals/Pain Pain Assessment: No/denies pain    Home Living Family/patient expects to be discharged to:: Private residence Living Arrangements: Spouse/significant other Available Help at Discharge: Family;Available 24 hours/day Type of Home: Apartment Home Access: Level entry     Home Layout: One level Home Equipment: Walker - 2 wheels      Prior Function Level of Independence: Independent with assistive device(s)         Comments: pt ambulated with use of RW     Hand Dominance        Extremity/Trunk Assessment   Upper Extremity Assessment Upper Extremity Assessment: Overall WFL for tasks assessed    Lower Extremity Assessment Lower Extremity Assessment: Overall WFL for tasks assessed       Communication   Communication: No difficulties  Cognition Arousal/Alertness: Awake/alert Behavior During Therapy: WFL for tasks assessed/performed Overall Cognitive Status: Within Functional Limits for tasks assessed  General Comments      Exercises     Assessment/Plan    PT Assessment Patient needs continued PT services  PT Problem List Decreased balance;Decreased mobility;Decreased coordination;Decreased safety awareness       PT Treatment Interventions DME instruction;Gait training;Stair training;Functional mobility training;Therapeutic activities;Therapeutic exercise;Balance  training;Neuromuscular re-education;Patient/family education    PT Goals (Current goals can be found in the Care Plan section)  Acute Rehab PT Goals Patient Stated Goal: return home tomorrow PT Goal Formulation: With patient Time For Goal Achievement: 11/16/17 Potential to Achieve Goals: Good    Frequency Min 3X/week   Barriers to discharge        Co-evaluation               AM-PAC PT "6 Clicks" Daily Activity  Outcome Measure Difficulty turning over in bed (including adjusting bedclothes, sheets and blankets)?: A Little Difficulty moving from lying on back to sitting on the side of the bed? : A Little Difficulty sitting down on and standing up from a chair with arms (e.g., wheelchair, bedside commode, etc,.)?: Unable Help needed moving to and from a bed to chair (including a wheelchair)?: None Help needed walking in hospital room?: None Help needed climbing 3-5 steps with a railing? : A Little 6 Click Score: 18    End of Session Equipment Utilized During Treatment: Gait belt Activity Tolerance: Patient tolerated treatment well Patient left: in chair;with call bell/phone within reach;with family/visitor present Nurse Communication: Mobility status PT Visit Diagnosis: Other abnormalities of gait and mobility (R26.89)    Time: 0379-5583 PT Time Calculation (min) (ACUTE ONLY): 20 min   Charges:   PT Evaluation $PT Eval Moderate Complexity: [place], [place], [place] 11/02/2017, 4:43 PM

## 2017-11-02 NOTE — Progress Notes (Signed)
Progress Note  Patient Name: Ann Fletcher Date of Encounter: 11/02/2017  Primary Cardiologist:   [name], MD   Subjective   Breathing better did not get right breakfast   Inpatient Medications    Scheduled Meds: . apixaban  5 mg Oral BID  . atorvastatin  40 mg Oral QHS  . furosemide  80 mg Intravenous BID  . levothyroxine  100 mcg Oral QAC breakfast  . metFORMIN  1,000 mg Oral BID WC  . sodium chloride flush  3 mL Intravenous Q12H  . vitamin E  400 Units Oral Daily   Continuous Infusions: . sodium chloride Stopped (11/02/17 0332)  . azithromycin Stopped (11/01/17 2249)  . cefTRIAXone (ROCEPHIN)  IV Stopped (11/01/17 2135)   PRN Meds: sodium chloride, acetaminophen, albuterol, LORazepam, ondansetron (ZOFRAN) IV, sodium chloride flush, traMADol   Vital Signs    Vitals:   11/01/17 2319 11/02/17 0302 11/02/17 0624 11/02/17 0829  BP: (!) 117/54 (!) 115/58  (!) 124/59  Pulse: 70 70  71  Resp: (!) 23 20  19   Temp: 98.1 F (36.7 C) 97.8 F (36.6 C)  98 F (36.7 C)  TempSrc: Oral Oral  Oral  SpO2: 94% 95%  93%  Weight:   156 lb (70.8 kg)   Height:        Intake/Output Summary (Last 24 hours) at 11/02/2017 1101 Last data filed at 11/02/2017 0900 Gross per 24 hour  Intake 877.93 ml  Output 1400 ml  Net -522.07 ml   Filed Weights   11/01/17 0325 11/02/17 0624  Weight: 160 lb 4.4 oz (72.7 kg) 156 lb (70.8 kg)    Telemetry   V pacing   ECG    V pacing no acute changes   Physical Exam   Affect appropriate Overweight elderly white female  HEENT: normal Neck supple with no adenopathy JVP normal no bruits no thyromegaly Lungs clear with no wheezing and good diaphragmatic motion Heart:  S1/S2 no murmur, no rub, gallop or click PMI normal pacer under left clavicle  Abdomen: benighn, BS positve, no tenderness, no AAA no bruit.  No HSM or HJR Distal pulses intact with no bruits No edema Neuro non-focal Skin warm and dry No muscular weakness   Labs     Chemistry Recent Labs  Lab 10/31/17 2155 11/01/17 1425 11/02/17 0318  NA 139 138 141  K 4.2 3.8 3.8  CL 101 97* 97*  CO2 23 26 30   GLUCOSE 195* 183* 146*  BUN 24* 23 24*  CREATININE 1.33* 1.28* 1.36*  CALCIUM 9.6 9.4 8.9  PROT 7.2  --   --   ALBUMIN 3.9  --   --   AST 26  --   --   ALT 14  --   --   ALKPHOS 51  --   --   BILITOT 1.0  --   --   GFRNONAA 36* 38* 35*  GFRAA 42* 44* 40*  ANIONGAP 15 15 14      Hematology Recent Labs  Lab 10/31/17 2155 11/01/17 1725  WBC 10.1 11.2*  RBC 3.39* 3.47*  HGB 8.0* 8.0*  HCT 26.4* 26.8*  MCV 77.9* 77.2*  MCH 23.6* 23.1*  MCHC 30.3 29.9*  RDW 16.9* 17.0*  PLT 171 174    Cardiac Enzymes Recent Labs  Lab 10/31/17 2155 11/01/17 0300 11/01/17 0810 11/01/17 1416  TROPONINI 0.03* 0.03* 0.03* 0.03*   No results for input(s): TROPIPOC in the last 168 hours.   BNP Recent Labs  Lab 10/31/17 2155  BNP 582.0*     DDimer No results for input(s): DDIMER in the last 168 hours.   Radiology    Dg Chest 2 View  Result Date: 10/31/2017 CLINICAL DATA:  Shortness of breath EXAM: CHEST - 2 VIEW COMPARISON:  08/24/2017, CT chest 08/19/2017, radiograph 10/14/2014 FINDINGS: Left-sided pacing device as before. Marked cardiomegaly with globular cardiac configuration. Vascular congestion and mild diffuse interstitial opacity consistent with minimal edema. No significant effusion. Aortic atherosclerosis. No pneumothorax. IMPRESSION: Cardiomegaly with globular cardiac configuration, component of pericardial effusion cannot be ruled out. There is vascular congestion and mild interstitial edema. Electronically Signed   By: Donavan Foil M.D.   On: 10/31/2017 22:32   Dg Chest Port 1 View  Result Date: 11/01/2017 CLINICAL DATA:  Shortness of breath EXAM: PORTABLE CHEST 1 VIEW COMPARISON:  A 04/21/2017 FINDINGS: 1807 hours. The cardio pericardial silhouette is enlarged. Asymmetric elevation right hemidiaphragm is stable. Vascular congestion  again noted with features suggesting interstitial edema. Bones are diffusely demineralized. Telemetry leads overlie the chest. IMPRESSION: No substantial change. Enlargement of the cardiopericardial silhouette with vascular congestion and interstitial pulmonary edema pattern. Electronically Signed   By: Misty Stanley M.D.   On: 11/01/2017 19:40    Cardiac Studies   NA  Patient Profile     84 y.o. 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   Assessment & Plan    CKD III:   Cr stable 1.36 despite diuresis   PAF (AV node ablation):   Continue anticoagulation.  Hard to tell rhythm with V pacing   ACUTE ON CHRONIC DIASTOLIC HF:   Improved continue iv diuretics today  TROPONIN ELEVATION:  Non diagnostic.  No chest pain no further w/u  Pneumonia:  On antibiotics f/u CXR in am does not appear toxic   For questions or updates, please contact [place] Please consult [email or web address] for contact info under Cardiology/STEMI.   Signed, Jenkins Rouge, MD  11/02/2017, 11:01 AM

## 2017-11-03 ENCOUNTER — Inpatient Hospital Stay (HOSPITAL_COMMUNITY): Payer: Medicare Other

## 2017-11-03 DIAGNOSIS — D509 Iron deficiency anemia, unspecified: Secondary | ICD-10-CM

## 2017-11-03 DIAGNOSIS — Z7901 Long term (current) use of anticoagulants: Secondary | ICD-10-CM

## 2017-11-03 DIAGNOSIS — I480 Paroxysmal atrial fibrillation: Secondary | ICD-10-CM

## 2017-11-03 LAB — IRON AND TIBC
Iron: 17 ug/dL — ABNORMAL LOW (ref 28–170)
Saturation Ratios: 4 % — ABNORMAL LOW (ref 10.4–31.8)
TIBC: 402 ug/dL (ref 250–450)
UIBC: 385 ug/dL

## 2017-11-03 LAB — CBC
HCT: 27 % — ABNORMAL LOW (ref 36.0–46.0)
Hemoglobin: 7.9 g/dL — ABNORMAL LOW (ref 12.0–15.0)
MCH: 23 pg — ABNORMAL LOW (ref 26.0–34.0)
MCHC: 29.3 g/dL — ABNORMAL LOW (ref 30.0–36.0)
MCV: 78.7 fL (ref 78.0–100.0)
Platelets: 178 10*3/uL (ref 150–400)
RBC: 3.43 MIL/uL — ABNORMAL LOW (ref 3.87–5.11)
RDW: 17.1 % — ABNORMAL HIGH (ref 11.5–15.5)
WBC: 8.8 10*3/uL (ref 4.0–10.5)

## 2017-11-03 LAB — BASIC METABOLIC PANEL
Anion gap: 11 (ref 5–15)
BUN: 30 mg/dL — ABNORMAL HIGH (ref 8–23)
CO2: 30 mmol/L (ref 22–32)
Calcium: 8.6 mg/dL — ABNORMAL LOW (ref 8.9–10.3)
Chloride: 93 mmol/L — ABNORMAL LOW (ref 98–111)
Creatinine, Ser: 1.44 mg/dL — ABNORMAL HIGH (ref 0.44–1.00)
GFR calc Af Amer: 38 mL/min — ABNORMAL LOW (ref >60)
GFR calc non Af Amer: 33 mL/min — ABNORMAL LOW (ref >60)
Glucose, Bld: 158 mg/dL — ABNORMAL HIGH (ref 70–99)
Potassium: 4.1 mmol/L (ref 3.5–5.1)
Sodium: 134 mmol/L — ABNORMAL LOW (ref 135–145)

## 2017-11-03 LAB — FERRITIN: Ferritin: 37 ng/mL (ref 11–307)

## 2017-11-03 LAB — OCCULT BLOOD X 1 CARD TO LAB, STOOL: Fecal Occult Bld: POSITIVE — AB

## 2017-11-03 MED ORDER — GERHARDT'S BUTT CREAM
TOPICAL_CREAM | Freq: Three times a day (TID) | CUTANEOUS | Status: DC | PRN
  Filled 2017-11-03: qty 1

## 2017-11-03 MED ORDER — FUROSEMIDE 80 MG PO TABS
80.0000 mg | ORAL_TABLET | Freq: Every day | ORAL | Status: DC
  Administered 2017-11-03 – 2017-11-04 (×2): 80 mg via ORAL
  Filled 2017-11-03: qty 1

## 2017-11-03 MED ORDER — PANTOPRAZOLE SODIUM 40 MG PO TBEC
40.0000 mg | DELAYED_RELEASE_TABLET | Freq: Two times a day (BID) | ORAL | Status: DC
  Administered 2017-11-03 – 2017-11-07 (×7): 40 mg via ORAL
  Filled 2017-11-03 (×9): qty 1

## 2017-11-03 NOTE — Progress Notes (Addendum)
Patient has passed maroon-like stool x 3 this am. First stool appeared as undigested food. 2nd stool was dark brown (possibly old blood), Occult stool sent. Awaiting MD call back. Patient assisted to wheelchair x 2 for x ray.  MD made aware of stools. Ordered CBC for patient. Will pass on to observe on next shift.

## 2017-11-03 NOTE — Progress Notes (Signed)
Progress Note  Patient Name: Ann Fletcher Date of Encounter: 11/03/2017  Primary Cardiologist:   [name], MD   Subjective   Breathing better ambulating well in hall Dark stool this am sent for hemoccult   Inpatient Medications    Scheduled Meds: . apixaban  5 mg Oral BID  . atorvastatin  40 mg Oral QHS  . furosemide  80 mg Intravenous BID  . levothyroxine  100 mcg Oral QAC breakfast  . metFORMIN  1,000 mg Oral BID WC  . sodium chloride flush  3 mL Intravenous Q12H  . vitamin E  400 Units Oral Daily   Continuous Infusions: . sodium chloride Stopped (11/02/17 0332)  . azithromycin 500 mg (11/02/17 2104)  . cefTRIAXone (ROCEPHIN)  IV 2 g (11/02/17 2020)   PRN Meds: sodium chloride, acetaminophen, albuterol, LORazepam, ondansetron (ZOFRAN) IV, sodium chloride flush, traMADol   Vital Signs    Vitals:   11/02/17 1949 11/03/17 0010 11/03/17 0300 11/03/17 0741  BP: (!) 154/60 (!) 150/63 (!) 149/95 (!) 144/65  Pulse: 72 72 78 70  Resp: 18 20 20  (!) 27  Temp: 97.8 F (36.6 C) (!) 97.5 F (36.4 C) 98.4 F (36.9 C) 98 F (36.7 C)  TempSrc: Oral Oral Oral Oral  SpO2: 97% 100% 95% 100%  Weight:   159 lb 12.8 oz (72.5 kg)   Height:        Intake/Output Summary (Last 24 hours) at 11/03/2017 1044 Last data filed at 11/03/2017 0700 Gross per 24 hour  Intake 830 ml  Output 350 ml  Net 480 ml   Filed Weights   11/01/17 0325 11/02/17 0624 11/03/17 0300  Weight: 160 lb 4.4 oz (72.7 kg) 156 lb (70.8 kg) 159 lb 12.8 oz (72.5 kg)    Telemetry   V pacing   ECG    V pacing no acute changes   Physical Exam   Affect appropriate Overweight elderly white female  HEENT: normal Neck supple with no adenopathy JVP normal no bruits no thyromegaly Lungs clear with no wheezing and good diaphragmatic motion Heart:  S1/S2 no murmur, no rub, gallop or click PMI normal pacer under left clavicle  Abdomen: benighn, BS positve, no tenderness, no AAA no bruit.  No HSM or  HJR Distal pulses intact with no bruits No edema Neuro non-focal Skin warm and dry No muscular weakness   Labs    Chemistry Recent Labs  Lab 10/31/17 2155 11/01/17 1425 11/02/17 0318 11/03/17 0220  NA 139 138 141 134*  K 4.2 3.8 3.8 4.1  CL 101 97* 97* 93*  CO2 23 26 30 30   GLUCOSE 195* 183* 146* 158*  BUN 24* 23 24* 30*  CREATININE 1.33* 1.28* 1.36* 1.44*  CALCIUM 9.6 9.4 8.9 8.6*  PROT 7.2  --   --   --   ALBUMIN 3.9  --   --   --   AST 26  --   --   --   ALT 14  --   --   --   ALKPHOS 51  --   --   --   BILITOT 1.0  --   --   --   GFRNONAA 36* 38* 35* 33*  GFRAA 42* 44* 40* 38*  ANIONGAP 15 15 14 11      Hematology Recent Labs  Lab 10/31/17 2155 11/01/17 1725 11/03/17 0721  WBC 10.1 11.2* 8.8  RBC 3.39* 3.47* 3.43*  HGB 8.0* 8.0* 7.9*  HCT 26.4* 26.8* 27.0*  MCV 77.9* 77.2* 78.7  MCH 23.6* 23.1* 23.0*  MCHC 30.3 29.9* 29.3*  RDW 16.9* 17.0* 17.1*  PLT 171 174 178    Cardiac Enzymes Recent Labs  Lab 10/31/17 2155 11/01/17 0300 11/01/17 0810 11/01/17 1416  TROPONINI 0.03* 0.03* 0.03* 0.03*   No results for input(s): TROPIPOC in the last 168 hours.   BNP Recent Labs  Lab 10/31/17 2155  BNP 582.0*     DDimer No results for input(s): DDIMER in the last 168 hours.   Radiology    Dg Chest 2 View  Result Date: 11/03/2017 CLINICAL DATA:  Follow-up pneumonia EXAM: CHEST - 2 VIEW COMPARISON:  11/01/2017 FINDINGS: Cardiac shadow is enlarged. Pacing device is again seen and stable. Lungs are well aerated bilaterally. No focal infiltrate or sizable effusion is seen. Mild interstitial markings are again identified but improved. No new focal abnormality is seen. IMPRESSION: Improved vascular congestion centrally. No new focal infiltrate is seen. Electronically Signed   By: Inez Catalina M.D.   On: 11/03/2017 08:04   Dg Chest Port 1 View  Result Date: 11/01/2017 CLINICAL DATA:  Shortness of breath EXAM: PORTABLE CHEST 1 VIEW COMPARISON:  A 04/21/2017  FINDINGS: 1807 hours. The cardio pericardial silhouette is enlarged. Asymmetric elevation right hemidiaphragm is stable. Vascular congestion again noted with features suggesting interstitial edema. Bones are diffusely demineralized. Telemetry leads overlie the chest. IMPRESSION: No substantial change. Enlargement of the cardiopericardial silhouette with vascular congestion and interstitial pulmonary edema pattern. Electronically Signed   By: Misty Stanley M.D.   On: 11/01/2017 19:40    Cardiac Studies   NA  Patient Profile     84 y.o. 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   Assessment & Plan    CKD III:   Cr stable 1.44 despite diuresis   PAF (AV node ablation):   Continue anticoagulation.  Hard to tell rhythm with V pacing   ACUTE ON CHRONIC DIASTOLIC HF:   Improved change to PO  diuretics today   TROPONIN ELEVATION:  Non diagnostic.  No chest pain no further w/u  Pneumonia:  On antibiotics CXR this am no infiltrate improved vascular congestion will d/c antibiotics  GI:  Hemoccult positive stool and chronic anemia  Hct 27 given need for anticoagulation will ask GI to see  For questions or updates, please contact [facility] Please consult [email or web address] for contact info under Cardiology/STEMI.   Signed, Jenkins Rouge, MD  11/03/2017, 10:44 AM

## 2017-11-03 NOTE — Consult Note (Signed)
Referring Provider:  Dr. Johnsie Cancel Primary Care Physician:  [name], MD Primary Gastroenterologist:  Dr. Earlean Shawl  Reason for Consultation:  Anemia, heme positive stool, GI bleed  HPI: Ann Fletcher is a 84 y.o. female with a history of atrial fibrillation (on Eliquis), status post Medtronic dual-chamber pacemaker placement after A-V node ablation in 05/8364, chronic diastolic heart failure, diabetes mellitus, hyperlipidemia, hypertension, chronic anemia, stage III CKD, hypothyroidism, lower extremity edema, and arthritis who presents with complaints of resting shortness of breath.  She was admitted for acute on chronic diastolic heart failure and possible PNA.  As started on antibiotics but those have now been discontinued.  Has chronic anemia with Hgb 9.9 grams 4 months ago.  ? Baseline around 10-10.5 grams.  Hgb 7.9 grams this AM, which has been stable for the past 3 days.  She passed some maroonish colored stools this AM was heme positive.  GI consulted due to her ongoing need for Eliquis.  Dose was held this AM.  Her GI MD is Dr. Earlean Shawl.  Says that her last colonoscopy was probably 5 years or more ago.  Does not thinks she has ever had an EGD.  Denies heartburn/reflux.  Not on acid medication at home and not on any here.  Says that she thinks her stools were dark at home for a couple of days prior to admission.  No abdominal pain.  Did have some vomiting at home prior to admission but denies seeing blood.  Reports some diarrhea intermittently at home.  Does not have a great appetite recently.     Past Medical History:  Diagnosis Date  . Anxiety   . Arthritis   . AV node arrhythmia    a. s/p AV node ablation/PPM 07/2017.  [name] Chronic diastolic CHF (congestive heart failure) ([place])    a. Acute exacerbation occurred in setting of AF.  [name] CKD (chronic kidney disease), stage III ([place])   . Diabetes mellitus without complication ([place])    type 2  . H/O bladder infections   .  Hyperlipidemia   . Hypertension   . Hypothyroidism   . Lower extremity edema   . Osteoporosis   . Persistent atrial fibrillation ([place])    a. diagnosed in 10/2014 by PCP, underwent failed TEE DCCV on 10/08/14, loaded with amio, successfully DCCV on 10/15/14.    Past Surgical History:  Procedure Laterality Date  . AV NODE ABLATION N/A 08/23/2017   Procedure: AV NODE ABLATION;  Surgeon: [name], MD;  Location: [facility];  Service: Cardiovascular;  Laterality: N/A;  . BACK SURGERY  2001, 2003, 2006   x 3  . CARDIOVERSION N/A 10/08/2014   Procedure: CARDIOVERSION;  Surgeon: [name], MD;  Location: [facility];  Service: Cardiovascular;  Laterality: N/A;  . CARDIOVERSION N/A 10/15/2014   Procedure: CARDIOVERSION;  Surgeon: [name], MD;  Location: [facility];  Service: Cardiovascular;  Laterality: N/A;  . CARDIOVERSION N/A 11/15/2014   Procedure: CARDIOVERSION;  Surgeon: [name], MD;  Location: [place];  Service: Cardiovascular;  Laterality: N/A;  . PACEMAKER IMPLANT N/A 08/23/2017   Procedure: PACEMAKER IMPLANT;  Surgeon: [name], MD;  Location: [place];  Service: Cardiovascular;  Laterality: N/A;  . 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 TEE WITHOUT CARDIOVERSION N/A 10/08/2014   Procedure: TRANSESOPHAGEAL ECHOCARDIOGRAM (TEE);  Surgeon: [name], MD;  Location: [place];  Service: Cardiovascular;  Laterality: N/A;  . TUBAL LIGATION  Prior to Admission medications   Medication Sig Start Date End Date Taking? Authorizing Provider  albuterol (PROVENTIL HFA;VENTOLIN HFA) 108 (90 BASE) MCG/ACT inhaler Inhale 2 puffs into the lungs 3 (three) times daily as needed for wheezing or shortness of breath.    Yes [provider]  atorvastatin (LIPITOR) 40 MG tablet Take 40 mg by mouth at bedtime.    Yes [provider]  Calcium Carbonate-Vitamin D (OSCAL 500/200 D-3 PO) Take 1 tablet by mouth daily.    Yes  [provider]  ELIQUIS 5 MG TABS tablet TAKE ONE TABLET TWICE DAILY Patient taking differently: TAKE ONE TABLET DAILY 11/08/16  Yes Sherran Needs, NP  fluticasone (FLONASE) 50 MCG/ACT nasal spray Place 1 spray into both nostrils daily as needed for allergies.  11/02/14  Yes [provider]  furosemide (LASIX) 80 MG tablet TAKE ONE TABLET EACH DAY Patient taking differently: TAKE ONE TABLET (80mg ) EACH DAY 04/26/17  Yes Sherran Needs, NP  levothyroxine (SYNTHROID, LEVOTHROID) 100 MCG tablet Take 100 mcg by mouth daily before breakfast.  10/28/14  Yes [provider]  LORazepam (ATIVAN) 1 MG tablet Take 1 mg by mouth 3 (three) times daily as needed for anxiety.    Yes [provider]  metFORMIN (GLUCOPHAGE-XR) 500 MG 24 hr tablet Take 1,000 mg by mouth 2 (two) times daily.    Yes [provider]  traMADol (ULTRAM) 50 MG tablet Take 50 mg by mouth every 8 (eight) hours as needed for moderate pain.    Yes [provider]  vitamin E (VITAMIN E) 400 UNIT capsule Take 400 Units by mouth daily.   Yes [provider]    Current Facility-Administered Medications  Medication Dose Route Frequency Provider Last Rate Last Dose  . 0.9 %  sodium chloride infusion  250 mL Intravenous PRN Meade Maw, MD   Stopped at 11/02/17 [phone]  . acetaminophen (TYLENOL) tablet 650 mg  650 mg Oral Q4H PRN Meade Maw, MD   650 mg at 11/01/17 1653  . albuterol (PROVENTIL) (2.5 MG/3ML) 0.083% nebulizer solution 2.5 mg  2.5 mg Inhalation TID PRN Meade Maw, MD   2.5 mg at 11/02/17 2132  . apixaban (ELIQUIS) tablet 5 mg  5 mg Oral BID Meade Maw, MD   5 mg at 11/02/17 2113  . atorvastatin (LIPITOR) tablet 40 mg  40 mg Oral QHS Meade Maw, MD   40 mg at 11/02/17 2113  . furosemide (LASIX) tablet 80 mg  80 mg Oral Daily Josue Hector, MD      . levothyroxine (SYNTHROID, LEVOTHROID) tablet 100 mcg  100 mcg Oral QAC breakfast  Meade Maw, MD   100 mcg at 11/03/17 [phone]  . LORazepam (ATIVAN) tablet 1 mg  1 mg Oral TID PRN Meade Maw, MD   1 mg at 11/03/17 0000  . metFORMIN (GLUCOPHAGE-XR) 24 hr tablet 1,000 mg  1,000 mg Oral BID WC Meade Maw, MD   1,000 mg at 11/03/17 1308  . ondansetron (ZOFRAN) injection 4 mg  4 mg Intravenous Q6H PRN Meade Maw, MD   4 mg at 11/01/17 1524  . sodium chloride flush (NS) 0.9 % injection 3 mL  3 mL Intravenous Q12H Meade Maw, MD   3 mL at 11/03/17 0840  . sodium chloride flush (NS) 0.9 % injection 3 mL  3 mL Intravenous PRN Paticia Stack, Lacie Scotts, MD      . traMADol Veatrice Bourbon)  tablet 50 mg  50 mg Oral Q8H PRN Meade Maw, MD      . vitamin E capsule 400 Units  400 Units Oral Daily Meade Maw, MD   400 Units at 11/03/17 7425    Allergies as of 10/31/2017 - Review Complete 10/31/2017  Allergen Reaction Noted  . Pradaxa [dabigatran etexilate mesylate] Nausea And Vomiting 03/24/2015  . Nsaids Other (See Comments) 12/13/2012  . Sulfa antibiotics Nausea And Vomiting and Other (See Comments) 10/29/2014  . Betadine [povidone iodine] Itching 02/17/2014    Family History  Problem Relation Age of Onset  . Pneumonia Mother   . Heart attack Father   . Diabetes Sister     Social History   Socioeconomic History  . Marital status: Married    Spouse name: Not on file  . Number of children: Not on file  . Years of education: Not on file  . Highest education level: Not on file  Occupational History  . Not on file  Social Needs  . Financial resource strain: Not on file  . Food insecurity:    Worry: Not on file    Inability: Not on file  . Transportation needs:    Medical: Not on file    Non-medical: Not on file  Tobacco Use  . Smoking status: Never Smoker  . Smokeless tobacco: Never Used  Substance and Sexual Activity  . Alcohol use: No  . Drug use: No  . Sexual activity: Yes    Birth control/protection: Surgical  Lifestyle  .  Physical activity:    Days per week: Not on file    Minutes per session: Not on file  . Stress: Not on file  Relationships  . Social connections:    Talks on phone: Not on file    Gets together: Not on file    Attends religious service: Not on file    Active member of club or organization: Not on file    Attends meetings of clubs or organizations: Not on file    Relationship status: Not on file  . Intimate partner violence:    Fear of current or ex partner: Not on file    Emotionally abused: Not on file    Physically abused: Not on file    Forced sexual activity: Not on file  Other Topics Concern  . Not on file  Social History Narrative  . Not on file    Review of Systems: ROS is O/W negative except as mentioned in HPI.  Physical Exam: Vital signs in last 24 hours: Temp:  [97.5 F (36.4 C)-98.4 F (36.9 C)] 98 F (36.7 C) (08/04 1102) Pulse Rate:  [70-78] 71 (08/04 1102) Resp:  [18-27] 25 (08/04 1102) BP: (143-160)/(60-95) 143/70 (08/04 1102) SpO2:  [95 %-100 %] 97 % (08/04 1201) Weight:  [159 lb 12.8 oz (72.5 kg)] 159 lb 12.8 oz (72.5 kg) (08/04 0300) Last BM Date: 11/02/17 General:  Alert, Well-developed, well-nourished, pleasant and cooperative in NAD Head:  Normocephalic and atraumatic. Eyes:  Sclera clear, no icterus.  Conjunctiva pink. Ears:  Normal auditory acuity. Mouth:  No deformity or lesions.   Lungs:  Wheezing noted B/L even noted while talking with the patient. Heart:  Regular rate and rhythm; no murmurs, clicks, rubs, or gallops. Abdomen:  Soft,nontender, BS active,nonpalp mass or hsm.   Rectal:  Deferred  Msk:  Symmetrical without gross deformities. Pulses:  Normal pulses noted. Extremities:  2+ pitting edema in B/L LE's. Neurologic:  Alert and  oriented x4;  grossly normal neurologically. Skin:  Intact without significant lesions or rashes. Psych:  Alert and cooperative. Normal mood and affect.  Intake/Output from previous day: 08/03 0701 -  08/04 0700 In: 1070 [P.O.:720; IV Piggyback:350] Out: 350 [Urine:350] Intake/Output this shift: Total I/O In: 240 [P.O.:240] Out: -   Lab Results: Recent Labs    10/31/17 2155 11/01/17 1725 11/03/17 0721  WBC 10.1 11.2* 8.8  HGB 8.0* 8.0* 7.9*  HCT 26.4* 26.8* 27.0*  PLT 171 174 178   BMET Recent Labs    11/01/17 1425 11/02/17 0318 11/03/17 0220  NA 138 141 134*  K 3.8 3.8 4.1  CL 97* 97* 93*  CO2 26 30 30   GLUCOSE 183* 146* 158*  BUN 23 24* 30*  CREATININE 1.28* 1.36* 1.44*  CALCIUM 9.4 8.9 8.6*   LFT Recent Labs    10/31/17 2155  PROT 7.2  ALBUMIN 3.9  AST 26  ALT 14  ALKPHOS 51  BILITOT 1.0   Studies/Results: Dg Chest 2 View  Result Date: 11/03/2017 CLINICAL DATA:  Follow-up pneumonia EXAM: CHEST - 2 VIEW COMPARISON:  11/01/2017 FINDINGS: Cardiac shadow is enlarged. Pacing device is again seen and stable. Lungs are well aerated bilaterally. No focal infiltrate or sizable effusion is seen. Mild interstitial markings are again identified but improved. No new focal abnormality is seen. IMPRESSION: Improved vascular congestion centrally. No new focal infiltrate is seen. Electronically Signed   By: Inez Catalina M.D.   On: 11/03/2017 08:04   Dg Chest Port 1 View  Result Date: 11/01/2017 CLINICAL DATA:  Shortness of breath EXAM: PORTABLE CHEST 1 VIEW COMPARISON:  A 04/21/2017 FINDINGS: 1807 hours. The cardio pericardial silhouette is enlarged. Asymmetric elevation right hemidiaphragm is stable. Vascular congestion again noted with features suggesting interstitial edema. Bones are diffusely demineralized. Telemetry leads overlie the chest. IMPRESSION: No substantial change. Enlargement of the cardiopericardial silhouette with vascular congestion and interstitial pulmonary edema pattern. Electronically Signed   By: Misty Stanley M.D.   On: 11/01/2017 19:40   IMPRESSION:  *84 year old female admitted with SOB thought to be due to acute on chronic diastolic HF and  possible PNA (antibiotics have been stopped now).  Hgb down 2-2.5 grams from baseline although stable over the past 3 days.  Now with possible maroonish stools this AM, were heme positive.  Is on Eliquis with last dose 8/3 PM.  PLAN: *Will discontinue Eliquis for now. *Will check iron studies. *Monitor Hgb and transfuse prn. *Will give clear liquids then NPO after midnight. *Will start pantoprazole 40 mg BID. *Dr. Havery Moros to discuss endoscopic evaluation with her and to determine timing.  Her respiratory status does not seem great right now.  ? Her baseline.   Laban Emperor. Zurii Hewes  11/03/2017, 12:45 PM

## 2017-11-03 NOTE — Progress Notes (Signed)
GI consult was placed today for evaluation for suspected GIB.   Pt is followed by Dr. Mayme Genta, MD @ [facility].   Consult was placed to [facility] unassigned group for today, [facility]. I personally spoke to GI PA. They will see today.

## 2017-11-04 DIAGNOSIS — K921 Melena: Secondary | ICD-10-CM

## 2017-11-04 DIAGNOSIS — D508 Other iron deficiency anemias: Secondary | ICD-10-CM

## 2017-11-04 DIAGNOSIS — I482 Chronic atrial fibrillation: Secondary | ICD-10-CM

## 2017-11-04 LAB — BASIC METABOLIC PANEL
Anion gap: 12 (ref 5–15)
BUN: 32 mg/dL — ABNORMAL HIGH (ref 8–23)
CO2: 26 mmol/L (ref 22–32)
Calcium: 8.7 mg/dL — ABNORMAL LOW (ref 8.9–10.3)
Chloride: 91 mmol/L — ABNORMAL LOW (ref 98–111)
Creatinine, Ser: 1.37 mg/dL — ABNORMAL HIGH (ref 0.44–1.00)
GFR calc Af Amer: 40 mL/min — ABNORMAL LOW (ref >60)
GFR calc non Af Amer: 35 mL/min — ABNORMAL LOW (ref >60)
Glucose, Bld: 170 mg/dL — ABNORMAL HIGH (ref 70–99)
Potassium: 4 mmol/L (ref 3.5–5.1)
Sodium: 129 mmol/L — ABNORMAL LOW (ref 135–145)

## 2017-11-04 LAB — GLUCOSE, CAPILLARY: Glucose-Capillary: 139 mg/dL — ABNORMAL HIGH (ref 70–99)

## 2017-11-04 MED ORDER — FUROSEMIDE 10 MG/ML IJ SOLN: 80.0000 mg | Freq: Two times a day (BID) | INTRAMUSCULAR | Status: DC

## 2017-11-04 MED ORDER — FUROSEMIDE 10 MG/ML IJ SOLN
80.0000 mg | Freq: Two times a day (BID) | INTRAMUSCULAR | Status: DC
  Administered 2017-11-04 – 2017-11-07 (×7): 80 mg via INTRAVENOUS
  Filled 2017-11-04 (×7): qty 8

## 2017-11-04 MED ORDER — LORAZEPAM 2 MG/ML IJ SOLN
1.0000 mg | Freq: Once | INTRAMUSCULAR | Status: AC
  Administered 2017-11-04: 1 mg via INTRAVENOUS
  Filled 2017-11-04: qty 1

## 2017-11-04 NOTE — Progress Notes (Signed)
Progress Note  Patient Name: Ann Fletcher Date of Encounter: 11/04/2017  Primary Cardiologist:   [name], MD   Subjective   Sitting in chair, feeling "wheezy" today.  Inpatient Medications    Scheduled Meds: . atorvastatin  40 mg Oral QHS  . furosemide  80 mg Oral Daily  . levothyroxine  100 mcg Oral QAC breakfast  . metFORMIN  1,000 mg Oral BID WC  . pantoprazole  40 mg Oral BID  . sodium chloride flush  3 mL Intravenous Q12H  . vitamin E  400 Units Oral Daily   Continuous Infusions: . sodium chloride Stopped (11/02/17 0332)   PRN Meds: sodium chloride, acetaminophen, albuterol, Gerhardt's butt cream, LORazepam, ondansetron (ZOFRAN) IV, sodium chloride flush, traMADol   Vital Signs    Vitals:   11/04/17 0500 11/04/17 0505 11/04/17 0801 11/04/17 0856  BP:  (!) 116/49 (!) 141/83   Pulse:   71   Resp:  (!) 23 14   Temp:  98.5 F (36.9 C) (!) 97.4 F (36.3 C)   TempSrc:  Oral Oral   SpO2:   98% 97%  Weight: 163 lb 12.8 oz (74.3 kg)     Height:        Intake/Output Summary (Last 24 hours) at 11/04/2017 1008 Last data filed at 11/04/2017 0900 Gross per 24 hour  Intake 720 ml  Output 0 ml  Net 720 ml   Filed Weights   11/02/17 0624 11/03/17 0300 11/04/17 0500  Weight: 156 lb (70.8 kg) 159 lb 12.8 oz (72.5 kg) 163 lb 12.8 oz (74.3 kg)    Telemetry   V pacing   ECG    V pacing no acute changes   Physical Exam   Affect appropriate Overweight elderly white female  HEENT: normal Neck supple with no adenopathy JVP normal no bruits no thyromegaly Crackles B/L, no wheezing Heart:  S1/S2 no murmur, no rub, gallop or click PMI normal pacer under left clavicle  Abdomen: benighn, BS positve, no tenderness, no AAA no bruit.  No HSM or HJR Distal pulses intact with no bruits 2+ B/L pitting edema up to mid calves Neuro non-focal Skin warm and dry No muscular weakness   Labs    Chemistry Recent Labs  Lab 10/31/17 2155  11/02/17 0318  11/03/17 0220 11/04/17 0241  NA 139   < > 141 134* 129*  K 4.2   < > 3.8 4.1 4.0  CL 101   < > 97* 93* 91*  CO2 23   < > 30 30 26   GLUCOSE 195*   < > 146* 158* 170*  BUN 24*   < > 24* 30* 32*  CREATININE 1.33*   < > 1.36* 1.44* 1.37*  CALCIUM 9.6   < > 8.9 8.6* 8.7*  PROT 7.2  --   --   --   --   ALBUMIN 3.9  --   --   --   --   AST 26  --   --   --   --   ALT 14  --   --   --   --   ALKPHOS 51  --   --   --   --   BILITOT 1.0  --   --   --   --   GFRNONAA 36*   < > 35* 33* 35*  GFRAA 42*   < > 40* 38* 40*  ANIONGAP 15   < > 14 11 12    < > =  values in this interval not displayed.     Hematology Recent Labs  Lab 10/31/17 2155 11/01/17 1725 11/03/17 0721  WBC 10.1 11.2* 8.8  RBC 3.39* 3.47* 3.43*  HGB 8.0* 8.0* 7.9*  HCT 26.4* 26.8* 27.0*  MCV 77.9* 77.2* 78.7  MCH 23.6* 23.1* 23.0*  MCHC 30.3 29.9* 29.3*  RDW 16.9* 17.0* 17.1*  PLT 171 174 178    Cardiac Enzymes Recent Labs  Lab 10/31/17 2155 11/01/17 0300 11/01/17 0810 11/01/17 1416  TROPONINI 0.03* 0.03* 0.03* 0.03*   No results for input(s): TROPIPOC in the last 168 hours.   BNP Recent Labs  Lab 10/31/17 2155  BNP 582.0*     DDimer No results for input(s): DDIMER in the last 168 hours.   Radiology    Dg Chest 2 View  Result Date: 11/03/2017 CLINICAL DATA:  Follow-up pneumonia EXAM: CHEST - 2 VIEW COMPARISON:  11/01/2017 FINDINGS: Cardiac shadow is enlarged. Pacing device is again seen and stable. Lungs are well aerated bilaterally. No focal infiltrate or sizable effusion is seen. Mild interstitial markings are again identified but improved. No new focal abnormality is seen. IMPRESSION: Improved vascular congestion centrally. No new focal infiltrate is seen. Electronically Signed   By: Inez Catalina M.D.   On: 11/03/2017 08:04    Cardiac Studies   NA  Patient Profile     84 y.o. female with a history of atrial fibrillation (on Eliquis), status post Medtronic dual-chamber pacemaker placement after  A-V node ablation, chronic diastolic heart failure, diabetes mellitus, hyperlipidemia, hypertension, chronic anemia, stage III CKD, hypothyroidism, lower extremity edema, and arthritis who presents with complaints of resting shortness of breath.   Assessment & Plan    CKD III:   Cr stable 1.37 despite diuresis   PAF (AV node ablation):   Continue anticoagulation.  Hard to tell rhythm with V pacing   ACUTE ON CHRONIC DIASTOLIC HF:   Worsened on PO lasix, she is fluid overloaded, I will switch to iv 80 mg BID   TROPONIN ELEVATION:  Non diagnostic.  No chest pain no further w/u  Pneumonia:  On antibiotics CXR this am no infiltrate improved vascular congestion will d/c antibiotics  GI:  Hemoccult positive stool and chronic anemia  Hct 27, Eliquis on hold for a EGD tomorrow, prep today  For questions or updates, please contact [place] Please consult [email or web address] for contact info under Cardiology/STEMI.   Signed, Ena Dawley, MD  11/04/2017, 10:08 AM

## 2017-11-04 NOTE — H&P (View-Only) (Signed)
     [facility] Progress Note   Chief Complaint:   Anemia, GI bleed  SUBJECTIVE:   Breathing a little better today. No other complaints. Family visiting   ASSESSMENT AND PLAN:    84 yo female with multiple AFib on Eliquis admitted with acute on chronic diastolic heart failure, possible PNA (but antibiotics now discontinued) and acute on chronic microcytic anemia with recent passage of maroon stools.  -Patient needs upper and lower endoscopic evaluation, especially given long term need for anticoagulation.. She is still fluid overload, wheezing on exam. Cardiology changing diuretics from PO to IV.  Spoke with family and we will reevaluate her breathing tomorrow. If better then will prep tomorrow afternoon for EGD / colon on Wed.      Attending physician's note   I have taken an interval history, reviewed the chart and examined the patient. I agree with the Advanced Practitioner's note, impression and recommendations. Discussed with patient's family. Plan EGD and colonoscopy once cardio-pulmonary status is better.  Carmell Austria, MD  OBJECTIVE:     Vital signs in last 24 hours: Temp:  [96 F (35.6 C)-98.5 F (36.9 C)] 97.8 F (36.6 C) (08/05 2003) Pulse Rate:  [71-78] 72 (08/05 2003) Resp:  [14-23] 18 (08/05 2003) BP: (116-150)/(49-105) 150/60 (08/05 2003) SpO2:  [97 %-99 %] 98 % (08/05 2003) Weight:  [163 lb 12.8 oz (74.3 kg)] 163 lb 12.8 oz (74.3 kg) (08/05 0500) Last BM Date: 11/03/17 General:   Alert, female sitting in bedside chair in NAD EENT:  Normal hearing, non icteric sclera, conjunctive pink.  Heart:  Regular rate and rhythm; no murmurs, 2+ BLE edema Pulm: Slightly labored breathing during conversation,  + wheezing.  Abdomen:  Soft, protuberant, nontender.  Normal bowel sounds,   Neurologic:  Alert and  oriented x4;  grossly normal neurologically. Psych:  Pleasant, cooperative.  Normal mood and affect.   Intake/Output from previous day: 08/04 0701 -  08/05 0700 In: 960 [P.O.:960] Out: -  Intake/Output this shift: No intake/output data recorded.  Lab Results: Recent Labs    11/03/17 0721  WBC 8.8  HGB 7.9*  HCT 27.0*  PLT 178   BMET Recent Labs    11/02/17 0318 11/03/17 0220 11/04/17 0241  NA 141 134* 129*  K 3.8 4.1 4.0  CL 97* 93* 91*  CO2 30 30 26   GLUCOSE 146* 158* 170*  BUN 24* 30* 32*  CREATININE 1.36* 1.44* 1.37*  CALCIUM 8.9 8.6* 8.7*    Dg Chest 2 View  Result Date: 11/03/2017 CLINICAL DATA:  Follow-up pneumonia EXAM: CHEST - 2 VIEW COMPARISON:  11/01/2017 FINDINGS: Cardiac shadow is enlarged. Pacing device is again seen and stable. Lungs are well aerated bilaterally. No focal infiltrate or sizable effusion is seen. Mild interstitial markings are again identified but improved. No new focal abnormality is seen. IMPRESSION: Improved vascular congestion centrally. No new focal infiltrate is seen. Electronically Signed   By: Inez Catalina M.D.   On: 11/03/2017 08:04    Active Problems:   Acute on chronic congestive heart failure (HCC)   Iron deficiency anemia   LOS: 3 days   Tye Savoy ,NP 11/04/2017, 8:53 PM

## 2017-11-04 NOTE — Progress Notes (Signed)
Pt anxious and irritable. RN and NA have ambulated w/ pt multiple times and pt has sat out by RN as well. Pt also stating she feels like someone is going to kill her. Provider made aware. Verbal order received (see MAR) RN will continue to monitor.

## 2017-11-04 NOTE — Progress Notes (Signed)
Physical Therapy Treatment Patient Details Name: Ann Fletcher MRN: [id number] DOB: [birth year] Today's Date: 11/04/2017    History of Present Illness Pt is an 84 y/o female admitted secondary to SOB and found to have PNA.  Pt also with significant anemia and heme (+) stools.  Due for EGD on 11/05/17. 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     PT Comments    Pt is unsteady on her feet, even with RW, requiring min assist during gait to prevent LOB x 2 today.  Any time she removes a hand from RW she staggers.  Some nausea this AM, but able to continue with gait as planned.  It seemed to pass by the end of the session.    Follow Up Recommendations  Home health PT;Other (comment)(due to balance deficits and h/o falls)     Equipment Recommendations  None recommended by PT    Recommendations for Other Services   NA     Precautions / Restrictions Precautions Precautions: Fall Precaution Comments: h/o falls at home, unsteady on her feet even with RW Restrictions Weight Bearing Restrictions: No    Mobility  Bed Mobility               General bed mobility comments: Pt was OOB in the recliner chair.   Transfers Overall transfer level: Needs assistance Equipment used: Rolling walker (2 wheeled) Transfers: Sit to/from Stand Sit to Stand: Min guard         General transfer comment: Min guard for safety and balance  Ambulation/Gait Ambulation/Gait assistance: Min guard;Min assist Gait Distance (Feet): 250 Feet Assistive device: Rolling walker (2 wheeled) Gait Pattern/deviations: Step-through pattern;Staggering left;Staggering right     General Gait Details: Pt with staggering gait pattern, especially when she removes one hand from RW to tell me  something min assist for 2 LOB to prevent fall.           Balance Overall balance assessment: Needs assistance Sitting-balance support: Feet supported;No upper extremity supported Sitting balance-Leahy Scale: Good     Standing balance support: Bilateral upper extremity supported Standing balance-Leahy Scale: Poor Standing balance comment: needs asssit in standing.                             Cognition Arousal/Alertness: Awake/alert Behavior During Therapy: WFL for tasks assessed/performed Overall Cognitive Status: No family/caregiver present to determine baseline cognitive functioning                                 General Comments: Not specifically tested.  Sitter in room during my session today         General Comments General comments (skin integrity, edema, etc.): Pt nauseated at the beginning of the session, it passed, but brought emesis bag with [place] just in case.       Pertinent Vitals/Pain Pain Assessment: No/denies pain       Prior Function            PT Goals (current goals can now be found in the care plan section) Acute Rehab PT Goals Patient Stated Goal: to get stronger Progress towards PT goals: Progressing toward goals    Frequency    Min 3X/week  PT Plan Current plan remains appropriate       AM-PAC PT "6 Clicks" Daily Activity  Outcome Measure  Difficulty turning over in bed (including adjusting bedclothes, sheets and blankets)?: A Little Difficulty moving from lying on back to sitting on the side of the bed? : A Little Difficulty sitting down on and standing up from a chair with arms (e.g., wheelchair, bedside commode, etc,.)?: Unable Help needed moving to and from a bed to chair (including a wheelchair)?: A Little Help needed walking in hospital room?: A Little Help needed climbing 3-5 steps with a railing? : A Little 6 Click Score: 16    End of Session   Activity Tolerance: Patient limited by  fatigue Patient left: in chair;with call bell/phone within reach;with nursing/sitter in room   PT Visit Diagnosis: Other abnormalities of gait and mobility (R26.89)     Time: 1110-1130 PT Time Calculation (min) (ACUTE ONLY): 20 min  Charges:  $Gait Training: 8-22 mins                    Anneta Rounds B. Colbert, [place], DPT [phone]   11/04/2017, 11:36 AM

## 2017-11-04 NOTE — Progress Notes (Signed)
Diet orders changed to coincide w/verbal orders of gastroenterology, Dr. [name], MD.  Plans are for UGI/colonoscopy to take place on 11/06/2017.  Patient will continue w/solid foods tonight, start clear liquids tomorrow, and will be NPO at midnight 11/06/2017 for procedures on 08/07.

## 2017-11-04 NOTE — Care Management Note (Signed)
Case Management Note  Patient Details  Name: PRACHI OFTEDAHL MRN: [id number] Date of Birth: 08-05-1932  Subjective/Objective:      Pt admitted with SOB              Action/Plan:   PTA from home with husband.  Pt is not on oxygen at home, has an aid supplied by [facility]  paid out of medicaid - pt is unsure of the number of hours per day.  CM contacted [place] and agency verified that pt has PCS services provided by [employer].  CM attempted to call [employer] however was unable to reach anyone or leave a message.   Pt educated on importance of daily weights and low salt diet.  PT eval ordered.     Expected Discharge Date:  11/05/17               Expected Discharge Plan:     In-House Referral:  Clinical Social Work  Discharge planning Services  CM Consult  Post Acute Care Choice:    Choice offered to:     DME Arranged:    DME Agency:     HH Arranged:    HH Agency:     Status of Service:     If discussed at [employer], dates discussed:    Additional Comments: 11/04/2017 CM left HH choice list with pt and will follow up in the am Maryclare Labrador, RN 11/04/2017, 3:25 PM

## 2017-11-04 NOTE — Progress Notes (Addendum)
     [facility] Progress Note   Chief Complaint:   Anemia, GI bleed  SUBJECTIVE:   Breathing a little better today. No other complaints. Family visiting   ASSESSMENT AND PLAN:    84 yo female with multiple AFib on Eliquis admitted with acute on chronic diastolic heart failure, possible PNA (but antibiotics now discontinued) and acute on chronic microcytic anemia with recent passage of maroon stools.  -Patient needs upper and lower endoscopic evaluation, especially given long term need for anticoagulation.. She is still fluid overload, wheezing on exam. Cardiology changing diuretics from PO to IV.  Spoke with family and we will reevaluate her breathing tomorrow. If better then will prep tomorrow afternoon for EGD / colon on Wed.      Attending physician's note   I have taken an interval history, reviewed the chart and examined the patient. I agree with the Advanced Practitioner's note, impression and recommendations. Discussed with patient's family. Plan EGD and colonoscopy once cardio-pulmonary status is better.  Carmell Austria, MD  OBJECTIVE:     Vital signs in last 24 hours: Temp:  [96 F (35.6 C)-98.5 F (36.9 C)] 97.8 F (36.6 C) (08/05 2003) Pulse Rate:  [71-78] 72 (08/05 2003) Resp:  [14-23] 18 (08/05 2003) BP: (116-150)/(49-105) 150/60 (08/05 2003) SpO2:  [97 %-99 %] 98 % (08/05 2003) Weight:  [163 lb 12.8 oz (74.3 kg)] 163 lb 12.8 oz (74.3 kg) (08/05 0500) Last BM Date: 11/03/17 General:   Alert, female sitting in bedside chair in NAD EENT:  Normal hearing, non icteric sclera, conjunctive pink.  Heart:  Regular rate and rhythm; no murmurs, 2+ BLE edema Pulm: Slightly labored breathing during conversation,  + wheezing.  Abdomen:  Soft, protuberant, nontender.  Normal bowel sounds,   Neurologic:  Alert and  oriented x4;  grossly normal neurologically. Psych:  Pleasant, cooperative.  Normal mood and affect.   Intake/Output from previous day: 08/04 0701 -  08/05 0700 In: 960 [P.O.:960] Out: -  Intake/Output this shift: No intake/output data recorded.  Lab Results: Recent Labs    11/03/17 0721  WBC 8.8  HGB 7.9*  HCT 27.0*  PLT 178   BMET Recent Labs    11/02/17 0318 11/03/17 0220 11/04/17 0241  NA 141 134* 129*  K 3.8 4.1 4.0  CL 97* 93* 91*  CO2 30 30 26   GLUCOSE 146* 158* 170*  BUN 24* 30* 32*  CREATININE 1.36* 1.44* 1.37*  CALCIUM 8.9 8.6* 8.7*    Dg Chest 2 View  Result Date: 11/03/2017 CLINICAL DATA:  Follow-up pneumonia EXAM: CHEST - 2 VIEW COMPARISON:  11/01/2017 FINDINGS: Cardiac shadow is enlarged. Pacing device is again seen and stable. Lungs are well aerated bilaterally. No focal infiltrate or sizable effusion is seen. Mild interstitial markings are again identified but improved. No new focal abnormality is seen. IMPRESSION: Improved vascular congestion centrally. No new focal infiltrate is seen. Electronically Signed   By: Inez Catalina M.D.   On: 11/03/2017 08:04    Active Problems:   Acute on chronic congestive heart failure (HCC)   Iron deficiency anemia   LOS: 3 days   Tye Savoy ,NP 11/04/2017, 8:53 PM

## 2017-11-05 LAB — CBC
HCT: 25.9 % — ABNORMAL LOW (ref 36.0–46.0)
Hemoglobin: 7.7 g/dL — ABNORMAL LOW (ref 12.0–15.0)
MCH: 22.6 pg — ABNORMAL LOW (ref 26.0–34.0)
MCHC: 29.7 g/dL — ABNORMAL LOW (ref 30.0–36.0)
MCV: 76.2 fL — ABNORMAL LOW (ref 78.0–100.0)
Platelets: 203 10*3/uL (ref 150–400)
RBC: 3.4 MIL/uL — ABNORMAL LOW (ref 3.87–5.11)
RDW: 16.3 % — ABNORMAL HIGH (ref 11.5–15.5)
WBC: 6.6 10*3/uL (ref 4.0–10.5)

## 2017-11-05 LAB — BASIC METABOLIC PANEL
Anion gap: 13 (ref 5–15)
BUN: 30 mg/dL — ABNORMAL HIGH (ref 8–23)
CO2: 27 mmol/L (ref 22–32)
Calcium: 8.4 mg/dL — ABNORMAL LOW (ref 8.9–10.3)
Chloride: 90 mmol/L — ABNORMAL LOW (ref 98–111)
Creatinine, Ser: 1.24 mg/dL — ABNORMAL HIGH (ref 0.44–1.00)
GFR calc Af Amer: 45 mL/min — ABNORMAL LOW (ref >60)
GFR calc non Af Amer: 39 mL/min — ABNORMAL LOW (ref >60)
Glucose, Bld: 119 mg/dL — ABNORMAL HIGH (ref 70–99)
Potassium: 3.5 mmol/L (ref 3.5–5.1)
Sodium: 130 mmol/L — ABNORMAL LOW (ref 135–145)

## 2017-11-05 MED ORDER — IPRATROPIUM-ALBUTEROL 0.5-2.5 (3) MG/3ML IN SOLN
3.0000 mL | Freq: Four times a day (QID) | RESPIRATORY_TRACT | Status: DC
  Administered 2017-11-05: 3 mL via RESPIRATORY_TRACT
  Filled 2017-11-05: qty 3

## 2017-11-05 MED ORDER — IPRATROPIUM-ALBUTEROL 0.5-2.5 (3) MG/3ML IN SOLN
3.0000 mL | Freq: Two times a day (BID) | RESPIRATORY_TRACT | Status: DC
  Administered 2017-11-05 – 2017-11-07 (×4): 3 mL via RESPIRATORY_TRACT
  Filled 2017-11-05 (×5): qty 3

## 2017-11-05 MED ORDER — POTASSIUM CHLORIDE CRYS ER 20 MEQ PO TBCR
40.0000 meq | EXTENDED_RELEASE_TABLET | Freq: Once | ORAL | Status: AC
  Administered 2017-11-05: 40 meq via ORAL
  Filled 2017-11-05: qty 2

## 2017-11-05 MED ORDER — PEG-KCL-NACL-NASULF-NA ASC-C 100 G PO SOLR
1.0000 | Freq: Once | ORAL | Status: AC
  Administered 2017-11-06: 200 g via ORAL
  Filled 2017-11-05: qty 1

## 2017-11-05 MED ORDER — PEG-KCL-NACL-NASULF-NA ASC-C 100 G PO SOLR
1.0000 | Freq: Once | ORAL | Status: AC
  Administered 2017-11-05: 200 g via ORAL
  Filled 2017-11-05: qty 1

## 2017-11-05 NOTE — Care Management Note (Signed)
Case Management Note  Patient Details  Name: MIRELLA GUEYE MRN: [id number] Date of Birth: 1932-09-23  Subjective/Objective:      Pt admitted with SOB              Action/Plan:   PTA from home with husband.  Pt is not on oxygen at home, has an aid supplied by [facility]  paid out of medicaid - pt is unsure of the number of hours per day.  CM contacted [place] and agency verified that pt has PCS services provided by [employer].  CM attempted to call [employer] however was unable to reach anyone or leave a message.   Pt educated on importance of daily weights and low salt diet.  PT eval ordered.     Expected Discharge Date:  11/05/17               Expected Discharge Plan:     In-House Referral:  Clinical Social Work  Discharge planning Services  CM Consult  Post Acute Care Choice:    Choice offered to:     DME Arranged:    DME Agency:     HH Arranged:    HH Agency:     Status of Service:     If discussed at [employer], dates discussed:    Additional Comments: 11/05/2017  CM [facility] choice not yet made  CM left HH choice list with pt and will follow up in the am.  Pt has already contacted [facility] worker and services will resume once she discharges.   Maryclare Labrador, RN 11/05/2017, 4:37 PM

## 2017-11-05 NOTE — Progress Notes (Signed)
Progress Note  Patient Name: Ann Fletcher Date of Encounter: 11/05/2017  Primary Cardiologist:   [name], MD  Subjective   She is still feeling wheezy and SOB. Improved from yesterday.   Inpatient Medications    Scheduled Meds: . atorvastatin  40 mg Oral QHS  . furosemide  80 mg Intravenous BID  . levothyroxine  100 mcg Oral QAC breakfast  . metFORMIN  1,000 mg Oral BID WC  . pantoprazole  40 mg Oral BID  . sodium chloride flush  3 mL Intravenous Q12H  . vitamin E  400 Units Oral Daily   Continuous Infusions: . sodium chloride Stopped (11/02/17 0332)   PRN Meds: sodium chloride, acetaminophen, albuterol, Gerhardt's butt cream, LORazepam, ondansetron (ZOFRAN) IV, sodium chloride flush, traMADol   Vital Signs    Vitals:   11/05/17 0300 11/05/17 0315 11/05/17 0500 11/05/17 0736  BP:  (!) 130/56  138/71  Pulse:  69  69  Resp:  (!) 27  18  Temp:  98.1 F (36.7 C)  (!) 97.4 F (36.3 C)  TempSrc:  Oral  Oral  SpO2: 97% 100%  98%  Weight:   161 lb 9.6 oz (73.3 kg)   Height:        Intake/Output Summary (Last 24 hours) at 11/05/2017 1044 Last data filed at 11/05/2017 0916 Gross per 24 hour  Intake 583 ml  Output 2025 ml  Net -1442 ml   Filed Weights   11/03/17 0300 11/04/17 0500 11/05/17 0500  Weight: 159 lb 12.8 oz (72.5 kg) 163 lb 12.8 oz (74.3 kg) 161 lb 9.6 oz (73.3 kg)   Telemetry   V pacing   ECG    V pacing no acute changes   Physical Exam   Affect appropriate Overweight elderly white female  HEENT: normal Neck supple with no adenopathy JVP normal no bruits no thyromegaly Crackles B/L, no wheezing Heart:  S1/S2 no murmur, no rub, gallop or click PMI normal pacer under left clavicle  Abdomen: benighn, BS positve, no tenderness, no AAA no bruit.  No HSM or HJR Distal pulses intact with no bruits 2+ B/L pitting edema up to mid calves Neuro non-focal Skin warm and dry No muscular weakness   Labs    Chemistry Recent Labs  Lab  10/31/17 2155  11/03/17 0220 11/04/17 0241 11/05/17 0341  NA 139   < > 134* 129* 130*  K 4.2   < > 4.1 4.0 3.5  CL 101   < > 93* 91* 90*  CO2 23   < > 30 26 27   GLUCOSE 195*   < > 158* 170* 119*  BUN 24*   < > 30* 32* 30*  CREATININE 1.33*   < > 1.44* 1.37* 1.24*  CALCIUM 9.6   < > 8.6* 8.7* 8.4*  PROT 7.2  --   --   --   --   ALBUMIN 3.9  --   --   --   --   AST 26  --   --   --   --   ALT 14  --   --   --   --   ALKPHOS 51  --   --   --   --   BILITOT 1.0  --   --   --   --   GFRNONAA 36*   < > 33* 35* 39*  GFRAA 42*   < > 38* 40* 45*  ANIONGAP 15   < > 11  12 13   < > = values in this interval not displayed.     Hematology Recent Labs  Lab 10/31/17 2155 11/01/17 1725 11/03/17 0721  WBC 10.1 11.2* 8.8  RBC 3.39* 3.47* 3.43*  HGB 8.0* 8.0* 7.9*  HCT 26.4* 26.8* 27.0*  MCV 77.9* 77.2* 78.7  MCH 23.6* 23.1* 23.0*  MCHC 30.3 29.9* 29.3*  RDW 16.9* 17.0* 17.1*  PLT 171 174 178    Cardiac Enzymes Recent Labs  Lab 10/31/17 2155 11/01/17 0300 11/01/17 0810 11/01/17 1416  TROPONINI 0.03* 0.03* 0.03* 0.03*   No results for input(s): TROPIPOC in the last 168 hours.   BNP Recent Labs  Lab 10/31/17 2155  BNP 582.0*     DDimer No results for input(s): DDIMER in the last 168 hours.   Radiology    No results found.  Cardiac Studies   NA  Patient Profile     84 y.o. 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   Assessment & Plan    CKD III:   Improving Cr 1.37-->1.24  PAF (AV node ablation):   Continue anticoagulation.  Hard to tell rhythm with V pacing   ACUTE ON CHRONIC DIASTOLIC HF:   Worsened on PO lasix, restarted iv Lasix yesterday  with good diuresis,  Still has crackles B/L, also b/L LE edema, I will continue iv diuresis, hyponatremia is worsening, we will follow closely  TROPONIN ELEVATION:  Non diagnostic.  No chest pain no further w/u  Pneumonia:  On antibiotics CXR this am no infiltrate improved vascular congestion will d/c antibiotics  GI:  Hemoccult positive stool and chronic anemia  Hct 27, Hb dropping 9.2->7.9, Eliquis on hold for a EGD tomorrow, prep today, repeat HB today.  For questions or updates, please contact [facility] Please consult [email or web address] for contact info under Cardiology/STEMI.   Signed, Ena Dawley, MD  11/05/2017, 10:44 AM

## 2017-11-05 NOTE — Anesthesia Preprocedure Evaluation (Addendum)
Anesthesia Evaluation  Patient identified by MRN, date of birth, ID band Patient awake    Reviewed: Allergy & Precautions, NPO status , Patient's Chart, lab work & pertinent test results  History of Anesthesia Complications Negative for: history of anesthetic complications  Airway Mallampati: II  TM Distance: >3 FB Neck ROM: Full    Dental  (+) Edentulous Upper, Edentulous Lower   Pulmonary neg pulmonary ROS,    breath sounds clear to auscultation + wheezing      Cardiovascular hypertension, + dysrhythmias Atrial Fibrillation + pacemaker (for CHB) + Valvular Problems/Murmurs  Rhythm:Irregular Rate:Normal   '19 TTE - Severe concentric LVH. EF 60% to 65%. Mild MR. Moderately dilated RV. RV systolic function was mildly to moderately reduced. Moderately dilated RA.  Moderate TR. A trivial, free-flowing pericardial effusion was identified circumferential to the heart. There was no evidence of hemodynamic compromise.     Neuro/Psych Anxiety negative neurological ROS     GI/Hepatic negative GI ROS, Neg liver ROS,   Endo/Other  diabetes, Type 2, Oral Hypoglycemic AgentsHypothyroidism   Renal/GU CRFRenal disease  negative genitourinary   Musculoskeletal  (+) Arthritis ,   Abdominal   Peds  Hematology  (+) anemia ,   Anesthesia Other Findings Hyponatremia  Reproductive/Obstetrics                            Anesthesia Physical Anesthesia Plan  ASA: III  Anesthesia Plan: MAC   Post-op Pain Management:    Induction: Intravenous  PONV Risk Score and Plan: 2 and Propofol infusion and Treatment may vary due to age or medical condition  Airway Management Planned: Nasal Cannula and Natural Airway  Additional Equipment: None  Intra-op Plan:   Post-operative Plan:   Informed Consent: I have reviewed the patients History and Physical, chart, labs and discussed the procedure including the risks,  benefits and alternatives for the proposed anesthesia with the patient or authorized representative who has indicated his/her understanding and acceptance.     Plan Discussed with: CRNA and Anesthesiologist  Anesthesia Plan Comments:        Anesthesia Quick Evaluation

## 2017-11-05 NOTE — Progress Notes (Addendum)
     [facility] Progress Note   Chief Complaint:   Anemia and GI bleed    SUBJECTIVE:    breathing is better today. No complaints   ASSESSMENT AND PLAN:    84 yo female with multiple medical problems and acute on chronic microcytic anemia with recent passage of maroon stools on Eliquis.  -She needs upper and lower endoscopic evaluation, especially given long term need for anticoagulation. Her breathing is better today, we will schedule her for procedures to be done tomorrow. The risks and benefits of colonoscopy with possible polypectomy were discussed and the patient agrees to proceed.    Attending physician's note   I have taken an interval history, reviewed the chart and examined the patient. I agree with the Advanced Practitioner's note, impression and recommendations.   84 year old with acute on chronic anemia with history of rectal bleeding while on Eliquis. Resp status is much better after diuresis. Proceed with EGD and colonoscopy tomorrow.She is aware of the risks and benefits.  Carmell Austria, MD   OBJECTIVE:     Vital signs in last 24 hours: Temp:  [96 F (35.6 C)-98.4 F (36.9 C)] 97.7 F (36.5 C) (08/06 1159) Pulse Rate:  [69-78] 69 (08/06 1159) Resp:  [14-27] 21 (08/06 1159) BP: (99-150)/(56-105) 99/62 (08/06 1159) SpO2:  [97 %-100 %] 98 % (08/06 1159) Weight:  [161 lb 9.6 oz (73.3 kg)] 161 lb 9.6 oz (73.3 kg) (08/06 0500) Last BM Date: 11/04/17 General:   Alert, female in NAD EENT:  Normal hearing, non icteric sclera, conjunctive pink.  Heart:  Regular rate and rhythm Pulm: Normal respiratory effort Abdomen:  Soft, nondistended, nontender.  Normal bowel sounds.     Neurologic:  Alert and  oriented x4;  grossly normal neurologically. Psych:  Pleasant, cooperative.  Normal mood and affect.   Intake/Output from previous day: 08/05 0701 - 08/06 0700 In: 0  Out: 1700 [Urine:1700] Intake/Output this shift: Total I/O In: 583 [P.O.:580;  I.V.:3] Out: 925 [Urine:925]  Lab Results: Recent Labs    11/03/17 0721 11/05/17 1059  WBC 8.8 6.6  HGB 7.9* 7.7*  HCT 27.0* 25.9*  PLT 178 203   BMET Recent Labs    11/03/17 0220 11/04/17 0241 11/05/17 0341  NA 134* 129* 130*  K 4.1 4.0 3.5  CL 93* 91* 90*  CO2 30 26 27   GLUCOSE 158* 170* 119*  BUN 30* 32* 30*  CREATININE 1.44* 1.37* 1.24*  CALCIUM 8.6* 8.7* 8.4*      LOS: 4 days   Tye Savoy ,NP 11/05/2017, 12:31 PM

## 2017-11-06 ENCOUNTER — Inpatient Hospital Stay (HOSPITAL_COMMUNITY): Payer: Medicare Other | Admitting: Anesthesiology

## 2017-11-06 ENCOUNTER — Encounter (HOSPITAL_COMMUNITY): Payer: Self-pay | Admitting: *Deleted

## 2017-11-06 ENCOUNTER — Encounter (HOSPITAL_COMMUNITY)
Admission: EM | Disposition: A | Discharge status: Home Health | Payer: Self-pay | Source: Home / Self Care | Attending: Cardiovascular Disease

## 2017-11-06 DIAGNOSIS — K635 Polyp of colon: Secondary | ICD-10-CM

## 2017-11-06 DIAGNOSIS — K2970 Gastritis, unspecified, without bleeding: Secondary | ICD-10-CM

## 2017-11-06 HISTORY — PX: ESOPHAGOGASTRODUODENOSCOPY: SHX5428

## 2017-11-06 HISTORY — PX: POLYPECTOMY: SHX5525

## 2017-11-06 HISTORY — PX: COLONOSCOPY: SHX5424

## 2017-11-06 HISTORY — PX: BIOPSY: SHX5522

## 2017-11-06 LAB — BASIC METABOLIC PANEL
Anion gap: 13 (ref 5–15)
BUN: 23 mg/dL (ref 8–23)
CO2: 28 mmol/L (ref 22–32)
Calcium: 8.8 mg/dL — ABNORMAL LOW (ref 8.9–10.3)
Chloride: 98 mmol/L (ref 98–111)
Creatinine, Ser: 1.13 mg/dL — ABNORMAL HIGH (ref 0.44–1.00)
GFR calc Af Amer: 51 mL/min — ABNORMAL LOW (ref >60)
GFR calc non Af Amer: 44 mL/min — ABNORMAL LOW (ref >60)
Glucose, Bld: 127 mg/dL — ABNORMAL HIGH (ref 70–99)
Potassium: 3.6 mmol/L (ref 3.5–5.1)
Sodium: 139 mmol/L (ref 135–145)

## 2017-11-06 SURGERY — COLONOSCOPY
Anesthesia: Monitor Anesthesia Care

## 2017-11-06 MED ORDER — LACTATED RINGERS IV SOLN
INTRAVENOUS | Status: DC
  Administered 2017-11-06: 08:00:00 via INTRAVENOUS

## 2017-11-06 MED ORDER — PROPOFOL 10 MG/ML IV BOLUS
INTRAVENOUS | Status: DC | PRN
  Administered 2017-11-06 (×3): 10 mg via INTRAVENOUS

## 2017-11-06 MED ORDER — PROPOFOL 500 MG/50ML IV EMUL
INTRAVENOUS | Status: DC | PRN
  Administered 2017-11-06: 75 ug/kg/min via INTRAVENOUS

## 2017-11-06 MED ORDER — PHENYLEPHRINE HCL 10 MG/ML IJ SOLN
INTRAMUSCULAR | Status: DC | PRN
  Administered 2017-11-06 (×2): 40 ug via INTRAVENOUS
  Administered 2017-11-06: 80 ug via INTRAVENOUS

## 2017-11-06 NOTE — Plan of Care (Signed)

## 2017-11-06 NOTE — Progress Notes (Signed)
Physical Therapy Treatment Patient Details Name: Ann Fletcher MRN: [id number] DOB: 1932-06-04 Today's Date: 11/06/2017    History of Present Illness Pt is an 84 y/o female admitted secondary to SOB and found to have PNA.  Pt also with significant anemia and heme (+) stools.  Due for EGD on 11/05/17. 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     PT Comments    Pt progressing with therapy today, ambulating further distance with less assistance. VSS on RA, no DOE or SOB noted during activity. Pts balance requires additional therapy after d/c, guard for safety with OOB mobility.   Follow Up Recommendations  Home health PT;Other (comment)(for balance )     Equipment Recommendations  None recommended by PT    Recommendations for Other Services       Precautions / Restrictions Precautions Precautions: Fall Precaution Comments: h/o falls at home, unsteady on her feet even with RW Restrictions Weight Bearing Restrictions: No    Mobility  Bed Mobility               General bed mobility comments: oob in chair at entry  Transfers Overall transfer level: Needs assistance Equipment used: Rolling walker (2 wheeled) Transfers: Sit to/from Stand Sit to Stand: Min guard         General transfer comment: Min guard for safety and balance  Ambulation/Gait Ambulation/Gait assistance: Min guard Gait Distance (Feet): 300 Feet Assistive device: Rolling walker (2 wheeled) Gait Pattern/deviations: Step-through pattern;Staggering left;Staggering right Gait velocity: decreased   General Gait Details: pt unstedy walking, no overt LOB requring additioanl support this time, improvment from prior session. guarding for safety. no DOE or SOB    Stairs              Wheelchair Mobility    Modified Rankin (Stroke Patients Only)       Balance Overall balance assessment: Needs assistance Sitting-balance support: Feet supported;No upper extremity supported Sitting balance-Leahy Scale: Good     Standing balance support: Bilateral upper extremity supported Standing balance-Leahy Scale: Poor Standing balance comment: needs asssit in standing.                             Cognition Arousal/Alertness: Awake/alert Behavior During Therapy: WFL for tasks assessed/performed Overall Cognitive Status: No family/caregiver present to determine baseline cognitive functioning                                        Exercises      General Comments        Pertinent Vitals/Pain Pain Assessment: No/denies pain    Home Living                      Prior Function            PT Goals (current goals can now be found in the care plan section) Acute Rehab PT Goals PT Goal Formulation: With patient Time For Goal Achievement: 11/16/17 Potential to Achieve Goals: Good Progress towards PT goals: Progressing toward goals    Frequency    Min 3X/week      PT Plan Current plan remains appropriate    Co-evaluation  AM-PAC PT "6 Clicks" Daily Activity  Outcome Measure  Difficulty turning over in bed (including adjusting bedclothes, sheets and blankets)?: A Little Difficulty moving from lying on back to sitting on the side of the bed? : A Little Difficulty sitting down on and standing up from a chair with arms (e.g., wheelchair, bedside commode, etc,.)?: Unable Help needed moving to and from a bed to chair (including a wheelchair)?: A Little Help needed walking in hospital room?: A Little Help needed climbing 3-5 steps with a railing? : A Little 6 Click Score: 16    End of Session Equipment Utilized During Treatment: Gait belt Activity Tolerance: Patient tolerated treatment  well Patient left: in chair;with call bell/phone within reach;with nursing/sitter in room Nurse Communication: Mobility status PT Visit Diagnosis: Other abnormalities of gait and mobility (R26.89)     Time: 7121-9758 PT Time Calculation (min) (ACUTE ONLY): 19 min  Charges:  $Gait Training: 8-22 mins                     Reinaldo Berber, PT, DPT Acute Rehab Services Pager: [phone]     Reinaldo Berber 11/06/2017, 1:17 PM

## 2017-11-06 NOTE — Op Note (Signed)
[facility] Patient Name: Ann Fletcher Procedure Date : 11/06/2017 MRN: [id number] Attending MD: [name] , MD Date of Birth: 03-26-1933 CSN: [id number] Age: 84 Admit Type: Inpatient Procedure:                Upper GI endoscopy Indications:              Unexplained iron deficiency anemia Providers:                [name], MD, Vista Lawman, RN, Alan Mulder,                            Technician, Tressia Miners, CRNA Referring MD:              Medicines:                Monitored Anesthesia Care Complications:            No immediate complications. Estimated Blood Loss:     Estimated blood loss was minimal. Procedure:                Pre-Anesthesia Assessment:                           - Prior to the procedure, a History and Physical                            was performed, and patient medications and                            allergies were reviewed. The patient's tolerance of                            previous anesthesia was also reviewed. The risks                            and benefits of the procedure and the sedation                            options and risks were discussed with the patient.                            All questions were answered, and informed consent                            was obtained. Prior Anticoagulants: The patient has                            taken no previous anticoagulant or antiplatelet                            agents. ASA Grade Assessment: III - A patient with                            severe systemic disease. After reviewing the risks  and benefits, the patient was deemed in                            satisfactory condition to undergo the procedure.                           After obtaining informed consent, the endoscope was                            passed under direct vision. Throughout the                            procedure, the patient's blood pressure, pulse, and          oxygen saturations were monitored continuously. The                            GIF-H190 ([id number] Olympus Adult EGD was introduced                            through the mouth, and advanced to the second part                            of duodenum. The upper GI endoscopy was                            accomplished without difficulty. The patient                            tolerated the procedure well. Scope In: Scope Out: Findings:      Localized mild inflammation characterized by erythema was found in the       gastric antrum. Biopsies were taken with a cold forceps for histology.       Biopsies for histology were taken with a cold forceps from the second       portion of the duodenum for evaluation of celiac disease. Estimated       blood loss was minimal.      The exam was otherwise without abnormality. Impression:               - Mild gastritis. Biopsied.                           - The examination was otherwise normal. Recommendation:           - Return patient to hospital ward for ongoing care.                           - Resume previous diet.                           - Continue present medications.                           - Await pathology results. Procedure Code(s):        --- Professional ---  54656, Esophagogastroduodenoscopy, flexible,                            transoral; with biopsy, single or multiple Diagnosis Code(s):        --- Professional ---                           K29.70, Gastritis, unspecified, without bleeding                           D50.9, Iron deficiency anemia, unspecified CPT copyright 2017 American Medical Association. All rights reserved. The codes documented in this report are preliminary and upon coder review may  be revised to meet current compliance requirements. [name], MD 11/06/2017 10:03:35 AM This report has been signed electronically. Number of Addenda: 0

## 2017-11-06 NOTE — Op Note (Signed)
[facility] Patient Name: Ann Fletcher Procedure Date : 11/06/2017 MRN: [id number] Attending MD: [name] , MD Date of Birth: 1933-03-09 CSN: [id number] Age: 84 Admit Type: Inpatient Procedure:                Colonoscopy Indications:              Rectal bleeding Providers:                [name], MD, Vista Lawman, RN, Alan Mulder,                            Technician, Tressia Miners, CRNA Referring MD:              Medicines:                Monitored Anesthesia Care Complications:            No immediate complications. Estimated Blood Loss:     Estimated blood loss: none. Procedure:                Pre-Anesthesia Assessment:                           - Prior to the procedure, a History and Physical                            was performed, and patient medications and                            allergies were reviewed. The patient's tolerance of                            previous anesthesia was also reviewed. The risks                            and benefits of the procedure and the sedation                            options and risks were discussed with the patient.                            All questions were answered, and informed consent                            was obtained. Prior Anticoagulants: The patient has                            taken no previous anticoagulant or antiplatelet                            agents. ASA Grade Assessment: III - A patient with                            severe systemic disease. After reviewing the risks  and benefits, the patient was deemed in                            satisfactory condition to undergo the procedure.                           After obtaining informed consent, the colonoscope                            was passed under direct vision. Throughout the                            procedure, the patient's blood pressure, pulse, and                            oxygen saturations  were monitored continuously. The                            PCF-H190DL ([id number]) peds colon was introduced                            through the anus and advanced to the 2 cm into the                            ileum. The colonoscopy was performed without                            difficulty. The patient tolerated the procedure                            well. The quality of the bowel preparation was good. Scope In: 9:27:04 AM Scope Out: 9:47:33 AM Scope Withdrawal Time: 0 hours 13 minutes 26 seconds  Total Procedure Duration: 0 hours 20 minutes 29 seconds  Findings:      A 6 mm polyp was found in the distal sigmoid colon. The polyp was       sessile. The polyp was removed with a cold snare. Resection and       retrieval were complete. Estimated blood loss was minimal.      A few rare small-mouthed diverticula were found in the sigmoid colon and       ascending colon.      Non-bleeding internal hemorrhoids were found. The hemorrhoids were small.      The exam was otherwise without abnormality on direct and retroflexion       views. Impression:               - One 6 mm polyp in the distal sigmoid colon,                            removed with a cold snare. Resected and retrieved.                           - Minimal colonic diverticulosis.                           -  Non-bleeding internal hemorrhoids.                           - The examination was otherwise normal on direct                            and retroflexion views. Recommendation:           - Return patient to hospital ward for ongoing care.                           - Resume previous diet.                           - Continue present medications.                           - Await pathology results.                           - Resume Eliquis from tomorrow onwards. Procedure Code(s):        --- Professional ---                           [id number], Colonoscopy, flexible; with removal of                            tumor(s),  polyp(s), or other lesion(s) by snare                            technique Diagnosis Code(s):        --- Professional ---                           D12.5, Benign neoplasm of sigmoid colon                           K64.8, Other hemorrhoids                           K62.5, Hemorrhage of anus and rectum                           K57.30, Diverticulosis of large intestine without                            perforation or abscess without bleeding CPT copyright 2017 American Medical Association. All rights reserved. The codes documented in this report are preliminary and upon coder review may  be revised to meet current compliance requirements. [name], MD 11/06/2017 10:08:38 AM This report has been signed electronically. Number of Addenda: 0

## 2017-11-06 NOTE — Transfer of Care (Signed)
Immediate Anesthesia Transfer of Care Note  Patient: Ann Fletcher  Procedure(s) Performed: COLONOSCOPY (N/A ) ESOPHAGOGASTRODUODENOSCOPY (EGD) (N/A ) BIOPSY  Patient Location: PACU  Anesthesia Type:MAC  Level of Consciousness: awake, alert , patient cooperative and responds to stimulation  Airway & Oxygen Therapy: Patient connected to nasal cannula oxygen  Post-op Assessment: Report given to RN, Post -op Vital signs reviewed and stable and Patient moving all extremities X 4  Post vital signs: Reviewed and stable  Last Vitals:  Vitals Value Taken Time  BP 124/65 11/06/2017  9:57 AM  Temp 36.7 C 11/06/2017  9:56 AM  Pulse 71 11/06/2017 10:02 AM  Resp 15 11/06/2017 10:02 AM  SpO2 99 % 11/06/2017 10:02 AM  Vitals shown include unvalidated device data.  Last Pain:  Vitals:   11/06/17 0956  TempSrc: Oral  PainSc: 0-No pain         Complications: No apparent anesthesia complications

## 2017-11-06 NOTE — Anesthesia Postprocedure Evaluation (Signed)
Anesthesia Post Note  Patient: Ann Fletcher  Procedure(s) Performed: COLONOSCOPY (N/A ) ESOPHAGOGASTRODUODENOSCOPY (EGD) (N/A ) BIOPSY     Patient location during evaluation: PACU Anesthesia Type: MAC Level of consciousness: awake and alert Pain management: pain level controlled Vital Signs Assessment: post-procedure vital signs reviewed and stable Respiratory status: spontaneous breathing, nonlabored ventilation and respiratory function stable Cardiovascular status: stable and blood pressure returned to baseline Anesthetic complications: no    Last Vitals:  Vitals:   11/06/17 1040 11/06/17 1147  BP:  (!) 162/43  Pulse:  71  Resp:  15  Temp:  (!) 36.4 C  SpO2: 95% 98%    Last Pain:  Vitals:   11/06/17 1147  TempSrc: Oral  PainSc:                  [name]

## 2017-11-06 NOTE — Interval H&P Note (Signed)
History and Physical Interval Note:  11/06/2017 8:53 AM  Ann Fletcher  has presented today for surgery, with the diagnosis of anemia, GI bleed  The various methods of treatment have been discussed with the patient and family. After consideration of risks, benefits and other options for treatment, the patient has consented to  Procedure(s): COLONOSCOPY (N/A) ESOPHAGOGASTRODUODENOSCOPY (EGD) (N/A) as a surgical intervention .  The patient's history has been reviewed, patient examined, no change in status, stable for surgery.  I have reviewed the patient's chart and labs.  Questions were answered to the patient's satisfaction.     [name]

## 2017-11-07 ENCOUNTER — Telehealth: Payer: Self-pay | Admitting: Cardiology

## 2017-11-07 ENCOUNTER — Other Ambulatory Visit: Payer: Self-pay | Admitting: Medical

## 2017-11-07 DIAGNOSIS — D508 Other iron deficiency anemias: Secondary | ICD-10-CM

## 2017-11-07 DIAGNOSIS — N183 Chronic kidney disease, stage 3 unspecified: Secondary | ICD-10-CM

## 2017-11-07 DIAGNOSIS — J849 Interstitial pulmonary disease, unspecified: Secondary | ICD-10-CM

## 2017-11-07 DIAGNOSIS — D500 Iron deficiency anemia secondary to blood loss (chronic): Secondary | ICD-10-CM

## 2017-11-07 LAB — BASIC METABOLIC PANEL
Anion gap: 15 (ref 5–15)
BUN: 16 mg/dL (ref 8–23)
CO2: 26 mmol/L (ref 22–32)
Calcium: 9 mg/dL (ref 8.9–10.3)
Chloride: 99 mmol/L (ref 98–111)
Creatinine, Ser: 1.23 mg/dL — ABNORMAL HIGH (ref 0.44–1.00)
GFR calc Af Amer: 46 mL/min — ABNORMAL LOW (ref >60)
GFR calc non Af Amer: 39 mL/min — ABNORMAL LOW (ref >60)
Glucose, Bld: 228 mg/dL — ABNORMAL HIGH (ref 70–99)
Potassium: 3.4 mmol/L — ABNORMAL LOW (ref 3.5–5.1)
Sodium: 140 mmol/L (ref 135–145)

## 2017-11-07 LAB — CBC
HCT: 26.4 % — ABNORMAL LOW (ref 36.0–46.0)
Hemoglobin: 7.8 g/dL — ABNORMAL LOW (ref 12.0–15.0)
MCH: 22.7 pg — ABNORMAL LOW (ref 26.0–34.0)
MCHC: 29.5 g/dL — ABNORMAL LOW (ref 30.0–36.0)
MCV: 77 fL — ABNORMAL LOW (ref 78.0–100.0)
Platelets: 201 10*3/uL (ref 150–400)
RBC: 3.43 MIL/uL — ABNORMAL LOW (ref 3.87–5.11)
RDW: 16.8 % — ABNORMAL HIGH (ref 11.5–15.5)
WBC: 8.2 10*3/uL (ref 4.0–10.5)

## 2017-11-07 MED ORDER — POTASSIUM CHLORIDE ER 20 MEQ PO TBCR: 20.0000 meq | EXTENDED_RELEASE_TABLET | Freq: Every day | ORAL | 3 refills | Status: DC

## 2017-11-07 MED ORDER — IPRATROPIUM-ALBUTEROL 0.5-2.5 (3) MG/3ML IN SOLN: 3.0000 mL | Freq: Two times a day (BID) | RESPIRATORY_TRACT | 0 refills | Status: DC

## 2017-11-07 MED ORDER — FERROUS SULFATE 325 (65 FE) MG PO TBEC: 325.0000 mg | DELAYED_RELEASE_TABLET | Freq: Three times a day (TID) | ORAL | 3 refills | Status: DC

## 2017-11-07 MED ORDER — FUROSEMIDE 80 MG PO TABS: 80.0000 mg | ORAL_TABLET | Freq: Two times a day (BID) | ORAL | 3 refills | Status: DC

## 2017-11-07 MED ORDER — DOCUSATE SODIUM 100 MG PO CAPS: 100.0000 mg | ORAL_CAPSULE | Freq: Two times a day (BID) | ORAL | 3 refills | Status: AC

## 2017-11-07 MED ORDER — PANTOPRAZOLE SODIUM 40 MG PO TBEC: 40.0000 mg | DELAYED_RELEASE_TABLET | Freq: Every day | ORAL | 3 refills | Status: DC

## 2017-11-07 MED ORDER — POTASSIUM CHLORIDE CRYS ER 20 MEQ PO TBCR
60.0000 meq | EXTENDED_RELEASE_TABLET | Freq: Once | ORAL | Status: AC
  Administered 2017-11-07: 60 meq via ORAL
  Filled 2017-11-07 (×2): qty 3

## 2017-11-07 MED ORDER — POTASSIUM CHLORIDE CRYS ER 20 MEQ PO TBCR: 60.0000 meq | EXTENDED_RELEASE_TABLET | Freq: Two times a day (BID) | ORAL | Status: DC

## 2017-11-07 NOTE — Discharge Planning (Signed)
EDCM notes that is active with [facility] for [facility] services.  EDCM contacted rep to notify of expansion of services.

## 2017-11-07 NOTE — Progress Notes (Signed)
Bmet result relayed to PA with order. Discharged home by wheelchair  accompanied by daughter, discharge instructions given to daughter, belongings taken home.

## 2017-11-07 NOTE — Progress Notes (Signed)
Called lab. to expedite BMET results that is holding the discharge home.

## 2017-11-07 NOTE — Care Management Important Message (Signed)
Important Message  Patient Details  Name: Ann Fletcher MRN: [id number] Date of Birth: 03-03-1933   Medicare Important Message Given:  Yes    Alyssha Housh P Brittnie Lewey 11/07/2017, 3:39 PM

## 2017-11-07 NOTE — Telephone Encounter (Signed)
New Message    Patient has a TOC appointment on 8/20 with Kerin Ransom

## 2017-11-07 NOTE — Discharge Instructions (Signed)
PLEASE REMEMBER TO BRING ALL OF YOUR MEDICATIONS TO EACH OF YOUR FOLLOW-UP OFFICE VISITS.  PLEASE ATTEND ALL SCHEDULED FOLLOW-UP APPOINTMENTS.   Please do not take your Eliquis until told to restart by a cardiology provider. We will check your blood work next week and at your next follow-up visit on 11/19/17 to determine if it is safe to restart this medication.   Your lasix was increased to 80mg  two times per day. Please monitor your weight at the same time every day and keep a log. Notify the office if you gain more than 2lb in 1 day or 5lb in 1 week for advice on how to manage this.   You blood count was lower than your baseline this admission. Likely related to low iron levels. We are discharging you home on oral iron three times daily to help improve your blood count. This medication can cause constipation so we recommend taking it with a stool softener.

## 2017-11-07 NOTE — Progress Notes (Addendum)
The patient is post EGD with no source of bleeding. She has iron deficiency anemia and we will start FeS 325 mg PO TID and stool softeners. She remains fluid overloaded, but her weight is bellow the baseline. She is adamant that she needs to go home. We will switch her baseline home Lasix 80 mg po daily to lasix 80 mg po BID and add KCl 20 mEq daily. We will arrange for an outpatient follow up in our clinic with labs.  We will hold off on Eliquis until the next visit and Hb at least 9. We will arrange for home PT as per PT recommendation.   Ena Dawley, MD 11/07/2017

## 2017-11-07 NOTE — Discharge Summary (Signed)
Discharge Summary    Patient ID: Ann Fletcher,  MRN: [id number], DOB/AGE: Aug 09, 1932 84 y.o.  Admit date: 10/31/2017 Discharge date: 11/07/2017  Primary Care Provider: [name] Primary Cardiologist: [name], MD  Discharge Diagnoses    Principal Problem:   Acute on chronic congestive heart failure [facility]) Active Problems:   PAF (paroxysmal atrial fibrillation) (HCC)   Iron deficiency anemia   Interstitial lung disease (HCC)   Allergies Allergies  Allergen Reactions  . Pradaxa [Dabigatran Etexilate Mesylate] Nausea And Vomiting  . Nsaids Other (See Comments)    Stomach cramps  . Sulfa Antibiotics Nausea And Vomiting and Other (See Comments)    GI upset  . Betadine [Povidone Iodine] Itching    Diagnostic Studies/Procedures    Echocardiogram 11/01/17: Study Conclusions  - Left ventricle: The cavity size was normal. There was severe concentric hypertrophy. Systolic function was normal. The estimated ejection fraction was in the range of 60% to 65%. Wallmotion was normal; there were no regional wall motion abnormalities. The study is not technically sufficient to allow evaluation of LV diastolic function. - Aortic valve: Trileaflet; mildly thickened, mildly calcified leaflets. - Mitral valve: There was mild regurgitation. - Right ventricle: The cavity size was moderately dilated. Wall thickness was normal. The moderator band was prominent. Pacerwire or catheter noted in right ventricle. Systolic function wasmildly to moderately reduced. - Right atrium: The atrium was moderately dilated. Pacer wire orcatheter noted in right atrium. - Tricuspid valve: There was moderate regurgitation. - Pericardium, extracardiac: A trivial, free-flowing pericardial effusion was identified circumferential to the heart. The fluid had no internal echoes.There was no evidence of hemodynamiccompromise. _____________   History of Present Illness     84  y.o. female with a history of atrial fibrillation (on Eliquis), CHB status post Medtronic dual-chamber pacemaker placement after A-V node ablation, chronic diastolic heart failure, diabetes mellitus, hyperlipidemia, hypertension, chronic anemia, stage III CKD, hypothyroidism, lower extremity edema, and arthritis who presented with complaints of resting shortness of breath. Patient reported having generalized weakness along with nausea. She vomited twice on the day of admission. She denied any fevers or chills. She did not have any productive cough. There was no wheezing. She has been noticing bilateral leg swelling. She denies any fevers or chills. She does not have palpitations, presyncope or syncope.   In the ED she was treated with IV Lasix. She was also transiently on supplemental oxygen.  Hospital Course     Consultants: GI   1. Acute on chronic diastolic CHF: patient presented with SOB. CXR on admission with vascular congestion and pulmonary edema. BNP 582. She was started on IV lasix 80mg  BID. Wt on admission was 72.7kg, down to 69.5kg on the day of discharge. Net UOP -1.6L this admission. Repeat CXR 8/4 with improvement in vascular congestion.  - Home lasix increased to 80mg  BID at discharge with po potassium 20 mEq daily  2. Acute on chronic anemia: Hgb 8.0 on admission, down to 7.7; baseline 9.2. FOBT+. GI was consulted and patient underwent EGD/Cscope 8/7 which did not reveal active bleeding. Hgb stable at 7.8 on the day of discharge. - Discharged on PO iron TID and stool softeners  3. Interstitial lung disease: follows with pulmonary outpatient. Had abnormal PFTs and CT chest c/f ILD. 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 Attempts at repeated PFTs have been unsuccessful.  - Continue outpatient follow-up with pulmonary - Will discharge home with nebulizer treatments for the next few  days  4. CKD stage III: Cr stable throughout admission - Continue routine  monitoring outpatient  _____________  Discharge Vitals Blood pressure 137/67, pulse 73, temperature 98.3 F (36.8 C), temperature source Oral, resp. rate (!) 22, height 5\' 2"  (1.575 m), weight 69.5 kg, SpO2 98 %.  Filed Weights   11/06/17 0349 11/06/17 0750 11/07/17 0500  Weight: 69.6 kg 69.6 kg 69.5 kg    Labs & Radiologic Studies    CBC Recent Labs    11/05/17 1059 11/07/17 1016  WBC 6.6 8.2  HGB 7.7* 7.8*  HCT 25.9* 26.4*  MCV 76.2* 77.0*  PLT 203 505   Basic Metabolic Panel Recent Labs    11/05/17 0341 11/06/17 0238  NA 130* 139  K 3.5 3.6  CL 90* 98  CO2 27 28  GLUCOSE 119* 127*  BUN 30* 23  CREATININE 1.24* 1.13*  CALCIUM 8.4* 8.8*   Liver Function Tests No results for input(s): AST, ALT, ALKPHOS, BILITOT, PROT, ALBUMIN in the last 72 hours. No results for input(s): LIPASE, AMYLASE in the last 72 hours. Cardiac Enzymes No results for input(s): CKTOTAL, CKMB, CKMBINDEX, TROPONINI in the last 72 hours. BNP Invalid input(s): POCBNP D-Dimer No results for input(s): DDIMER in the last 72 hours. Hemoglobin A1C No results for input(s): HGBA1C in the last 72 hours. Fasting Lipid Panel No results for input(s): CHOL, HDL, LDLCALC, TRIG, CHOLHDL, LDLDIRECT in the last 72 hours. Thyroid Function Tests No results for input(s): TSH, T4TOTAL, T3FREE, THYROIDAB in the last 72 hours.  Invalid input(s): FREET3 _____________  Dg Chest 2 View  Result Date: 11/03/2017 CLINICAL DATA:  Follow-up pneumonia EXAM: CHEST - 2 VIEW COMPARISON:  11/01/2017 FINDINGS: Cardiac shadow is enlarged. Pacing device is again seen and stable. Lungs are well aerated bilaterally. No focal infiltrate or sizable effusion is seen. Mild interstitial markings are again identified but improved. No new focal abnormality is seen. IMPRESSION: Improved vascular congestion centrally. No new focal infiltrate is seen. Electronically Signed   By: Inez Catalina M.D.   On: 11/03/2017 08:04   Dg Chest 2  View  Result Date: 10/31/2017 CLINICAL DATA:  Shortness of breath EXAM: CHEST - 2 VIEW COMPARISON:  08/24/2017, CT chest 08/19/2017, radiograph 10/14/2014 FINDINGS: Left-sided pacing device as before. Marked cardiomegaly with globular cardiac configuration. Vascular congestion and mild diffuse interstitial opacity consistent with minimal edema. No significant effusion. Aortic atherosclerosis. No pneumothorax. IMPRESSION: Cardiomegaly with globular cardiac configuration, component of pericardial effusion cannot be ruled out. There is vascular congestion and mild interstitial edema. Electronically Signed   By: Donavan Foil M.D.   On: 10/31/2017 22:32   Dg Chest Port 1 View  Result Date: 11/01/2017 CLINICAL DATA:  Shortness of breath EXAM: PORTABLE CHEST 1 VIEW COMPARISON:  A 04/21/2017 FINDINGS: 1807 hours. The cardio pericardial silhouette is enlarged. Asymmetric elevation right hemidiaphragm is stable. Vascular congestion again noted with features suggesting interstitial edema. Bones are diffusely demineralized. Telemetry leads overlie the chest. IMPRESSION: No substantial change. Enlargement of the cardiopericardial silhouette with vascular congestion and interstitial pulmonary edema pattern. Electronically Signed   By: Misty Stanley M.D.   On: 11/01/2017 19:40   Disposition   Patient was seen and examined by Dr. Meda Coffee who deemed patient as stable for discharge. Follow-up has been arranged. Discharge medications as listed below.   Follow-up Plans & Appointments    Follow-up Information    Erlene Quan, PA-C Follow up on 11/19/2017.   Specialties:  Cardiology, Radiology Why:  Please arrive 47  minutes early for your 8:30am post-hospital cardiology appointment  Contact information: [address] [place] [address] [phone]        [facility] Follow up.   Specialty:  Cardiology Why:  Please present on 11/13/17 to have your blood work checked to monitor your  blood counts. You will also need to have your blood checked on the day of your next follow-up visit on 11/19/17. You can arrive anytime between 8:30am and 4:00pm for blood work.  Contact information: [address] [place] [phone]           Discharge Medications   Allergies as of 11/07/2017      Reactions   Pradaxa [dabigatran Etexilate Mesylate] Nausea And Vomiting   Nsaids Other (See Comments)   Stomach cramps   Sulfa Antibiotics Nausea And Vomiting, Other (See Comments)   GI upset   Betadine [povidone Iodine] Itching      Medication List    STOP taking these medications   ELIQUIS 5 MG Tabs tablet Generic drug:  apixaban     TAKE these medications   albuterol 108 (90 Base) MCG/ACT inhaler Commonly known as:  PROVENTIL HFA;VENTOLIN HFA Inhale 2 puffs into the lungs 3 (three) times daily as needed for wheezing or shortness of breath.   atorvastatin 40 MG tablet Commonly known as:  LIPITOR Take 40 mg by mouth at bedtime.   docusate sodium 100 MG capsule Commonly known as:  COLACE Take 1 capsule (100 mg total) by mouth 2 (two) times daily.   ferrous sulfate 325 (65 FE) MG EC tablet Take 1 tablet (325 mg total) by mouth 3 (three) times daily with meals.   fluticasone 50 MCG/ACT nasal spray Commonly known as:  FLONASE Place 1 spray into both nostrils daily as needed for allergies.   furosemide 80 MG tablet Commonly known as:  LASIX Take 1 tablet (80 mg total) by mouth 2 (two) times daily. What changed:  See the new instructions.   ipratropium-albuterol 0.5-2.5 (3) MG/3ML Soln Commonly known as:  DUONEB Take 3 mLs by nebulization 2 (two) times daily for 3 days.   levothyroxine 100 MCG tablet Commonly known as:  SYNTHROID, LEVOTHROID Take 100 mcg by mouth daily before breakfast.   LORazepam 1 MG tablet Commonly known as:  ATIVAN Take 1 mg by mouth 3 (three) times daily as needed for anxiety.   metFORMIN 500 MG 24 hr  tablet Commonly known as:  GLUCOPHAGE-XR Take 1,000 mg by mouth 2 (two) times daily.   OSCAL 500/200 D-3 PO Take 1 tablet by mouth daily.   pantoprazole 40 MG tablet Commonly known as:  PROTONIX Take 1 tablet (40 mg total) by mouth daily.   Potassium Chloride ER 20 MEQ Tbcr Take 20 mEq by mouth daily.   traMADol 50 MG tablet Commonly known as:  ULTRAM Take 50 mg by mouth every 8 (eight) hours as needed for moderate pain.   vitamin E 400 UNIT capsule Generic drug:  vitamin E Take 400 Units by mouth daily.          Outstanding Labs/Studies   CBC next week to monitor Hgb (ordered) CBC and BMET on the day of next follow-up visit (ordered) If Hgb >9.0 at next follow-up visit, okay to restart eliquis for stroke prevention.  Duration of Discharge Encounter   Greater than 30 minutes including physician time.  Signed, Abigail Butts PA-C 11/07/2017, 12:00 PM

## 2017-11-07 NOTE — Progress Notes (Signed)
Progress Note  Patient Name: Ann Fletcher Date of Encounter: 11/07/2017  Primary Cardiologist: [name], MD   Subjective   Patient reports breathing is improved. Has been coughing and feels like chest congestion is breaking up. She denies abdominal pain, nausea, or vomiting. Denies bleeding. Asking to go home today  Inpatient Medications    Scheduled Meds: . atorvastatin  40 mg Oral QHS  . furosemide  80 mg Intravenous BID  . ipratropium-albuterol  3 mL Nebulization BID  . levothyroxine  100 mcg Oral QAC breakfast  . metFORMIN  1,000 mg Oral BID WC  . pantoprazole  40 mg Oral BID  . sodium chloride flush  3 mL Intravenous Q12H  . vitamin E  400 Units Oral Daily   Continuous Infusions: . sodium chloride 0 mL/hr at 11/02/17 0332   PRN Meds: sodium chloride, acetaminophen, albuterol, Gerhardt's butt cream, LORazepam, ondansetron (ZOFRAN) IV, sodium chloride flush, traMADol   Vital Signs    Vitals:   11/07/17 0407 11/07/17 0500 11/07/17 0732 11/07/17 0755  BP: (!) 134/47  (!) 128/45   Pulse: 72  71   Resp: 19  19   Temp: 98.3 F (36.8 C)     TempSrc: Oral     SpO2: 98%  96% 98%  Weight:  69.5 kg    Height:        Intake/Output Summary (Last 24 hours) at 11/07/2017 1045 Last data filed at 11/07/2017 0900 Gross per 24 hour  Intake 606 ml  Output 1000 ml  Net -394 ml   Filed Weights   11/06/17 0349 11/06/17 0750 11/07/17 0500  Weight: 69.6 kg 69.6 kg 69.5 kg    Telemetry    V paced rhythm - Personally Reviewed  Physical Exam   GEN: Elderly female sitting upright in bedside chair in no acute distress.   Neck: No JVD, no carotid bruits Cardiac: RRR, no murmurs, rubs, or gallops.  Respiratory: Diffuse rhonchi/wheezing with crackles at lung bases GI: NABS, Soft, nontender, non-distended  MS: 1-2+ LE edema; No deformity. Neuro:  Nonfocal, moving all extremities spontaneously Psych: Normal affect   Labs    Chemistry Recent Labs  Lab 10/31/17 2155   11/04/17 0241 11/05/17 0341 11/06/17 0238  NA 139   < > 129* 130* 139  K 4.2   < > 4.0 3.5 3.6  CL 101   < > 91* 90* 98  CO2 23   < > 26 27 28   GLUCOSE 195*   < > 170* 119* 127*  BUN 24*   < > 32* 30* 23  CREATININE 1.33*   < > 1.37* 1.24* 1.13*  CALCIUM 9.6   < > 8.7* 8.4* 8.8*  PROT 7.2  --   --   --   --   ALBUMIN 3.9  --   --   --   --   AST 26  --   --   --   --   ALT 14  --   --   --   --   ALKPHOS 51  --   --   --   --   BILITOT 1.0  --   --   --   --   GFRNONAA 36*   < > 35* 39* 44*  GFRAA 42*   < > 40* 45* 51*  ANIONGAP 15   < > 12 13 13    < > = values in this interval not displayed.     Hematology Recent  Labs  Lab 11/01/17 1725 11/03/17 0721 11/05/17 1059  WBC 11.2* 8.8 6.6  RBC 3.47* 3.43* 3.40*  HGB 8.0* 7.9* 7.7*  HCT 26.8* 27.0* 25.9*  MCV 77.2* 78.7 76.2*  MCH 23.1* 23.0* 22.6*  MCHC 29.9* 29.3* 29.7*  RDW 17.0* 17.1* 16.3*  PLT 174 178 203    Cardiac Enzymes Recent Labs  Lab 10/31/17 2155 11/01/17 0300 11/01/17 0810 11/01/17 1416  TROPONINI 0.03* 0.03* 0.03* 0.03*   No results for input(s): TROPIPOC in the last 168 hours.   BNP Recent Labs  Lab 10/31/17 2155  BNP 582.0*     DDimer No results for input(s): DDIMER in the last 168 hours.   Radiology    No results found.  Cardiac Studies   Echocardiogram 11/01/17: Study Conclusions  - Left ventricle: The cavity size was normal. There was severe   concentric hypertrophy. Systolic function was normal. The   estimated ejection fraction was in the range of 60% to 65%. Wall motion was normal; there were no regional wall motion   abnormalities. The study is not technically sufficient to allow   evaluation of LV diastolic function. - Aortic valve: Trileaflet; mildly thickened, mildly calcified   leaflets. - Mitral valve: There was mild regurgitation. - Right ventricle: The cavity size was moderately dilated. Wall   thickness was normal. The moderator band was prominent. Pacer wire or  catheter noted in right ventricle. Systolic function was mildly to moderately reduced. - Right atrium: The atrium was moderately dilated. Pacer wire or catheter noted in right atrium. - Tricuspid valve: There was moderate regurgitation. - Pericardium, extracardiac: A trivial, free-flowing pericardial   effusion was identified circumferential to the heart. The fluid   had no internal echoes.There was no evidence of hemodynamic compromise.   Patient Profile     84 y.o. female with a PMH of atrial fibrillation (on Eliquis), status post Medtronic dual-chamber pacemaker placement after A-V node ablation, chronic diastolic heart failure, diabetes mellitus, hyperlipidemia, hypertension, chronic anemia, stage III CKD, hypothyroidism, lower extremity edema, and arthritis who presented with SOB.  Assessment & Plan    1. Acute on chronic diastolic CHF: patient presented with SOB. CXR on admission with vascular congestion and pulmonary edema. BNP 582. She was started on IV lasix 80mg  BID. Wt on admission was 72.7kg, down to 69.5kg today. Net UOP not impressive with -56mL in the last 24 hours and -1.6L this admission. Repeat CXR 8/4 with improvement in vascular congestion. Still with rhonchi/wheezing/crackles on exam today, as well as 1-2+ LE edema -   2. Acute on chronic anemia: Hgb 8.0 on admission, down to 7.7; baseline 9.2. FOBT+. GI was consulted and patient underwent EGD/Cscope 8/7 which did not reveal active bleeding. Hgb stable at 7.8 today.   3. Interstitial lung disease: follows with pulmonary outpatient. Had abnormal PFTs   For questions or updates, please contact [place] Please consult [email or web address] for contact info under Cardiology/STEMI.      Signed, Abigail Butts, PA-C  11/07/2017, 10:45 AM   [phone]  The patient was seen, examined and discussed with Abigail Butts, PA-C  and I agree with the above.   The patient is post EGD with no source of bleeding. She has iron  deficiency anemia and we will start FeS 325 mg PO TID and stool softeners. She remains fluid overloaded, but her weight is bellow the baseline. She is adamant that she needs to go home. We will switch her baseline home Lasix 80 mg po  daily to lasix 80 mg po BID and add KCl 20 mEq daily. We will arrange for an outpatient follow up in our clinic with labs.  We will hold off on Eliquis until the next visit and Hb at least 9. We will arrange for home PT as per PT recommendation.   Ena Dawley, MD 11/07/2017

## 2017-11-08 DIAGNOSIS — Z7984 Long term (current) use of oral hypoglycemic drugs: Secondary | ICD-10-CM | POA: Diagnosis not present

## 2017-11-08 DIAGNOSIS — E1122 Type 2 diabetes mellitus with diabetic chronic kidney disease: Secondary | ICD-10-CM | POA: Diagnosis not present

## 2017-11-08 DIAGNOSIS — J849 Interstitial pulmonary disease, unspecified: Secondary | ICD-10-CM | POA: Diagnosis not present

## 2017-11-08 DIAGNOSIS — I5033 Acute on chronic diastolic (congestive) heart failure: Secondary | ICD-10-CM | POA: Diagnosis not present

## 2017-11-08 DIAGNOSIS — I480 Paroxysmal atrial fibrillation: Secondary | ICD-10-CM | POA: Diagnosis not present

## 2017-11-08 DIAGNOSIS — D509 Iron deficiency anemia, unspecified: Secondary | ICD-10-CM | POA: Diagnosis not present

## 2017-11-08 DIAGNOSIS — M1990 Unspecified osteoarthritis, unspecified site: Secondary | ICD-10-CM | POA: Diagnosis not present

## 2017-11-08 DIAGNOSIS — I130 Hypertensive heart and chronic kidney disease with heart failure and stage 1 through stage 4 chronic kidney disease, or unspecified chronic kidney disease: Secondary | ICD-10-CM | POA: Diagnosis not present

## 2017-11-08 DIAGNOSIS — N183 Chronic kidney disease, stage 3 (moderate): Secondary | ICD-10-CM | POA: Diagnosis not present

## 2017-11-08 NOTE — Telephone Encounter (Signed)
Patient contacted regarding discharge from [facility] on 11/07/17.  Patient understands to follow up with provider [facility] on 11/19/17 at 0830 at [facility]. Patient understands discharge instructions? YES Patient understands medications and regiment? YES Patient understands to bring all medications to this visit? YES

## 2017-11-11 NOTE — Consult Note (Signed)
            [facility] CM Primary Care Navigator  11/11/2017  LYNELL GREENHOUSE [birth year] [phone]   Attempt to see patient at the bedside to identify possible discharge needs butshe has been dischargedper staff. Per chart review, patient was discharged home with home health services.  Per MD note, patient presented with complaints of resting shortness of breath. She reported having generalized weakness along with nausea/ vomiting and  noted bilateral leg swelling. (acute on chronic anemia, acute on chronic diastolic congestive heart failure, interstitial lung disease, chronic kidney disease stage III)  Primary care provider's office is listed as providing transition of care (TOC) follow-up.  Patient also has discharge instruction to follow-up with cardiology on 8/14 (blood works) and on 11/19/17 for post hospital follow-up.    For additional questions please contact:  Edwena Felty A. Keyshaun Exley, BSN, RN-BC [facility] PRIMARY CARE Navigator Cell: [phone]

## 2017-11-13 ENCOUNTER — Other Ambulatory Visit: Payer: Self-pay

## 2017-11-13 DIAGNOSIS — E1122 Type 2 diabetes mellitus with diabetic chronic kidney disease: Secondary | ICD-10-CM | POA: Diagnosis not present

## 2017-11-13 DIAGNOSIS — I130 Hypertensive heart and chronic kidney disease with heart failure and stage 1 through stage 4 chronic kidney disease, or unspecified chronic kidney disease: Secondary | ICD-10-CM | POA: Diagnosis not present

## 2017-11-13 DIAGNOSIS — D509 Iron deficiency anemia, unspecified: Secondary | ICD-10-CM | POA: Diagnosis not present

## 2017-11-13 DIAGNOSIS — M1990 Unspecified osteoarthritis, unspecified site: Secondary | ICD-10-CM | POA: Diagnosis not present

## 2017-11-13 DIAGNOSIS — N183 Chronic kidney disease, stage 3 unspecified: Secondary | ICD-10-CM

## 2017-11-13 DIAGNOSIS — J849 Interstitial pulmonary disease, unspecified: Secondary | ICD-10-CM | POA: Diagnosis not present

## 2017-11-13 DIAGNOSIS — D508 Other iron deficiency anemias: Secondary | ICD-10-CM

## 2017-11-13 DIAGNOSIS — I480 Paroxysmal atrial fibrillation: Secondary | ICD-10-CM | POA: Diagnosis not present

## 2017-11-13 DIAGNOSIS — I5033 Acute on chronic diastolic (congestive) heart failure: Secondary | ICD-10-CM | POA: Diagnosis not present

## 2017-11-13 DIAGNOSIS — Z7984 Long term (current) use of oral hypoglycemic drugs: Secondary | ICD-10-CM | POA: Diagnosis not present

## 2017-11-13 LAB — BASIC METABOLIC PANEL
BUN/Creatinine Ratio: 22 (ref 12–28)
BUN: 27 mg/dL (ref 8–27)
CO2: 24 mmol/L (ref 20–29)
Calcium: 9.8 mg/dL (ref 8.7–10.3)
Chloride: 99 mmol/L (ref 96–106)
Creatinine, Ser: 1.22 mg/dL — ABNORMAL HIGH (ref 0.57–1.00)
GFR calc Af Amer: 47 mL/min/{1.73_m2} — ABNORMAL LOW (ref >59)
GFR calc non Af Amer: 41 mL/min/{1.73_m2} — ABNORMAL LOW (ref >59)
Glucose: 180 mg/dL — ABNORMAL HIGH (ref 65–99)
Potassium: 4.5 mmol/L (ref 3.5–5.2)
Sodium: 142 mmol/L (ref 134–144)

## 2017-11-13 LAB — CBC
Hematocrit: 25.9 % — ABNORMAL LOW (ref 34.0–46.6)
Hemoglobin: 7.8 g/dL — ABNORMAL LOW (ref 11.1–15.9)
MCH: 22.4 pg — ABNORMAL LOW (ref 26.6–33.0)
MCHC: 30.1 g/dL — ABNORMAL LOW (ref 31.5–35.7)
MCV: 74 fL — ABNORMAL LOW (ref 79–97)
Platelets: 222 10*3/uL (ref 150–450)
RBC: 3.48 x10E6/uL — ABNORMAL LOW (ref 3.77–5.28)
RDW: 16.9 % — ABNORMAL HIGH (ref 12.3–15.4)
WBC: 8.1 10*3/uL (ref 3.4–10.8)

## 2017-11-14 ENCOUNTER — Encounter: Payer: Self-pay | Admitting: Gastroenterology

## 2017-11-15 DIAGNOSIS — I480 Paroxysmal atrial fibrillation: Secondary | ICD-10-CM | POA: Diagnosis not present

## 2017-11-15 DIAGNOSIS — J849 Interstitial pulmonary disease, unspecified: Secondary | ICD-10-CM | POA: Diagnosis not present

## 2017-11-15 DIAGNOSIS — M1990 Unspecified osteoarthritis, unspecified site: Secondary | ICD-10-CM | POA: Diagnosis not present

## 2017-11-15 DIAGNOSIS — I130 Hypertensive heart and chronic kidney disease with heart failure and stage 1 through stage 4 chronic kidney disease, or unspecified chronic kidney disease: Secondary | ICD-10-CM | POA: Diagnosis not present

## 2017-11-15 DIAGNOSIS — E1122 Type 2 diabetes mellitus with diabetic chronic kidney disease: Secondary | ICD-10-CM | POA: Diagnosis not present

## 2017-11-15 DIAGNOSIS — D509 Iron deficiency anemia, unspecified: Secondary | ICD-10-CM | POA: Diagnosis not present

## 2017-11-15 DIAGNOSIS — N183 Chronic kidney disease, stage 3 (moderate): Secondary | ICD-10-CM | POA: Diagnosis not present

## 2017-11-15 DIAGNOSIS — Z7984 Long term (current) use of oral hypoglycemic drugs: Secondary | ICD-10-CM | POA: Diagnosis not present

## 2017-11-15 DIAGNOSIS — I5033 Acute on chronic diastolic (congestive) heart failure: Secondary | ICD-10-CM | POA: Diagnosis not present

## 2017-11-19 ENCOUNTER — Ambulatory Visit (INDEPENDENT_AMBULATORY_CARE_PROVIDER_SITE_OTHER): Payer: Medicare Other | Admitting: Cardiology

## 2017-11-19 ENCOUNTER — Encounter: Payer: Self-pay | Admitting: Cardiology

## 2017-11-19 DIAGNOSIS — I5033 Acute on chronic diastolic (congestive) heart failure: Secondary | ICD-10-CM | POA: Diagnosis not present

## 2017-11-19 DIAGNOSIS — I10 Essential (primary) hypertension: Secondary | ICD-10-CM

## 2017-11-19 DIAGNOSIS — R630 Anorexia: Secondary | ICD-10-CM | POA: Insufficient documentation

## 2017-11-19 DIAGNOSIS — I4891 Unspecified atrial fibrillation: Secondary | ICD-10-CM | POA: Diagnosis not present

## 2017-11-19 DIAGNOSIS — D649 Anemia, unspecified: Secondary | ICD-10-CM | POA: Insufficient documentation

## 2017-11-19 DIAGNOSIS — J849 Interstitial pulmonary disease, unspecified: Secondary | ICD-10-CM

## 2017-11-19 MED ORDER — FUROSEMIDE 80 MG PO TABS: 80.0000 mg | ORAL_TABLET | Freq: Two times a day (BID) | ORAL | 3 refills | Status: DC

## 2017-11-19 NOTE — Assessment & Plan Note (Signed)
ILD on CT and on exam

## 2017-11-19 NOTE — Assessment & Plan Note (Signed)
Fair control.

## 2017-11-19 NOTE — Assessment & Plan Note (Signed)
She has always had problems with medications and nausea. I suspect Iron is contibuting.

## 2017-11-19 NOTE — Assessment & Plan Note (Signed)
She is not taking Lasix as prescribed- will increase to twice a day

## 2017-11-19 NOTE — Assessment & Plan Note (Signed)
Iron deficiency anemia. No obvious bleeding with endoscopy and colonoscopy 11/06/17

## 2017-11-19 NOTE — Assessment & Plan Note (Signed)
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

## 2017-11-19 NOTE — Progress Notes (Signed)
11/19/2017 Ann Fletcher   1933/03/18  [id number]  Primary Physician [name], MD Primary Cardiologist: Dr [name]- Dr Lovena Le  HPI:  [name] is an 84 y/o female with a history of AF, HTN, DM, and COPD/ILD. She had been on Amiodarone but developed pulmonary fibrosis. Off Amiodarone she went back into AF with uncontrolled VR. She ultimately had an AVN RFA and pacemaker placed May 2019. She has struggled since. She was admitted 11/01/17 with CHF and significant anemia. She was diuresed from 72 kg to 69.6 kg. GI work up showed no obvious source of bleeding. She insisted on discharge 11/07/17. F/U Hgb 8/14 was unchanged 7.8. She is in the office today as a post hospital follow up. She is miserable. She is weak, SOB, and anorexic. She vomited in the office. I offered admission but she declined. She did admitted she was only taking her Lasix once a day.    Current Outpatient Medications  Medication Sig Dispense Refill  . albuterol (PROVENTIL HFA;VENTOLIN HFA) 108 (90 BASE) MCG/ACT inhaler Inhale 2 puffs into the lungs 3 (three) times daily as needed for wheezing or shortness of breath.     [name] atorvastatin (LIPITOR) 40 MG tablet Take 40 mg by mouth at bedtime.     . Calcium Carbonate-Vitamin D (OSCAL 500/200 D-3 PO) Take 1 tablet by mouth daily.     [name] docusate sodium (COLACE) 100 MG capsule Take 1 capsule (100 mg total) by mouth 2 (two) times daily. 60 capsule 3  . ferrous sulfate 325 (65 FE) MG EC tablet Take 1 tablet (325 mg total) by mouth 3 (three) times daily with meals. 90 tablet 3  . fluticasone (FLONASE) 50 MCG/ACT nasal spray Place 1 spray into both nostrils daily as needed for allergies.     . furosemide (LASIX) 80 MG tablet Take 1 tablet (80 mg total) by mouth 2 (two) times daily. 60 tablet 3  . levothyroxine (SYNTHROID, LEVOTHROID) 100 MCG tablet Take 100 mcg by mouth daily before breakfast.     . LORazepam (ATIVAN) 1 MG tablet Take 1 mg by mouth 3 (three) times daily as needed for  anxiety.     . metFORMIN (GLUCOPHAGE-XR) 500 MG 24 hr tablet Take 1,000 mg by mouth 2 (two) times daily.     . pantoprazole (PROTONIX) 40 MG tablet Take 1 tablet (40 mg total) by mouth daily. 30 tablet 3  . potassium chloride 20 MEQ TBCR Take 20 mEq by mouth daily. 30 tablet 3  . traMADol (ULTRAM) 50 MG tablet Take 50 mg by mouth every 8 (eight) hours as needed for moderate pain.     . vitamin E (VITAMIN E) 400 UNIT capsule Take 400 Units by mouth daily.     No current facility-administered medications for this visit.     Allergies  Allergen Reactions  . Pradaxa [Dabigatran Etexilate Mesylate] Nausea And Vomiting  . Nsaids Other (See Comments)    Stomach cramps  . Sulfa Antibiotics Nausea And Vomiting and Other (See Comments)    GI upset  . Betadine [Povidone Iodine] Itching    Past Medical History:  Diagnosis Date  . Anxiety   . Arthritis   . AV node arrhythmia    a. s/p AV node ablation/PPM 07/2017.  [name] Chronic diastolic CHF (congestive heart failure) ([place])    a. Acute exacerbation occurred in setting of AF.  [name] CKD (chronic kidney disease), stage III ([place])   . Diabetes mellitus without complication ([place])  type 2  . H/O bladder infections   . Hyperlipidemia   . Hypertension   . Hypothyroidism   . Lower extremity edema   . Osteoporosis   . Persistent atrial fibrillation ([place])    a. diagnosed in 10/2014 by PCP, underwent failed TEE DCCV on 10/08/14, loaded with amio, successfully DCCV on 10/15/14.    Social History   Socioeconomic History  . Marital status: Married    Spouse name: Not on file  . Number of children: Not on file  . Years of education: Not on file  . Highest education level: Not on file  Occupational History  . Not on file  Social Needs  . Financial resource strain: Not on file  . Food insecurity:    Worry: Not on file    Inability: Not on file  . Transportation needs:    Medical: Not on file    Non-medical: Not on file  Tobacco Use  . Smoking  status: Never Smoker  . Smokeless tobacco: Never Used  Substance and Sexual Activity  . Alcohol use: No  . Drug use: No  . Sexual activity: Yes    Birth control/protection: Surgical  Lifestyle  . Physical activity:    Days per week: Not on file    Minutes per session: Not on file  . Stress: Not on file  Relationships  . Social connections:    Talks on phone: Not on file    Gets together: Not on file    Attends religious service: Not on file    Active member of club or organization: Not on file    Attends meetings of clubs or organizations: Not on file    Relationship status: Not on file  . Intimate partner violence:    Fear of current or ex partner: Not on file    Emotionally abused: Not on file    Physically abused: Not on file    Forced sexual activity: Not on file  Other Topics Concern  . Not on file  Social History Narrative  . Not on file     Family History  Problem Relation Age of Onset  . Pneumonia Mother   . Heart attack Father   . Diabetes Sister      Review of Systems: General: negative for chills, fever, night sweats or weight changes.  Cardiovascular: negative for chest pain, dyspnea on exertion, edema, orthopnea, palpitations, paroxysmal nocturnal dyspnea or shortness of breath Dermatological: negative for rash Respiratory: negative for cough or wheezing Urologic: negative for hematuria Abdominal: negative for nausea, vomiting, diarrhea, bright red blood per rectum, melena, or hematemesis Neurologic: negative for visual changes, syncope, or dizziness All other systems reviewed and are otherwise negative except as noted above.    Blood pressure (!) 152/62, pulse 86, height 5\' 2"  (1.575 m), weight 153 lb 3.2 oz (69.5 kg), SpO2 97 %.  General appearance: alert, cooperative, appears stated age and no distress Lungs: velcro crackles Heart: regular rate and rhythm Extremities: no edema Skin: pale, cool, dry Neurologic: Grossly normal   ASSESSMENT AND  PLAN:   Acute on chronic diastolic CHF (congestive heart failure) ([place]) She is not taking Lasix as prescribed- will increase to twice a day  Atrial fibrillation with RVR ([place]) S/P AVN ablation and PTVDP May 2019  Anemia Iron deficiency anemia. No obvious bleeding with endoscopy and colonoscopy 11/06/17  Anorexia She has always had problems with medications and nausea. I suspect Iron is contibuting.  Interstitial lung disease ([place]) ILD on CT  and on exam  Essential hypertension Fair control   PLAN  I offered admission, I think she would benefit from tarns fusion- she declined. I suggested she increase her Lasix to 80 mg BID as ordered. I am not comfortable resuming her Eliquis with her significant anemia.  I would like to see her back with repeat labs in 1-2 weeks. If her Hgb is still less than 8 consider transfusion.  I will review with Dr [name].   Kerin Ransom PA-C 11/19/2017 9:38 AM

## 2017-11-19 NOTE — Patient Instructions (Addendum)
Medication Instructions:  Start Lasix 80 mg twice daily.  If you need a refill on your cardiac medications before your next appointment, please call your pharmacy.  Labwork: BMP,CBC HERE IN OUR OFFICE AT [facility] on 12/03/17 or 12/04/17 for lab work.  Testing/Procedures: None Ordered  Special Instructions: None Ordered  Follow-Up: Your physician wants you to follow-up in: 1-2 weeks with Kerin Ransom, PA.    Thank you for choosing [facility] at [facility]!!

## 2017-11-19 NOTE — Assessment & Plan Note (Deleted)
She is not taking Lasix as prescribed- will increase to twice a day

## 2017-11-20 DIAGNOSIS — I480 Paroxysmal atrial fibrillation: Secondary | ICD-10-CM | POA: Diagnosis not present

## 2017-11-20 DIAGNOSIS — I130 Hypertensive heart and chronic kidney disease with heart failure and stage 1 through stage 4 chronic kidney disease, or unspecified chronic kidney disease: Secondary | ICD-10-CM | POA: Diagnosis not present

## 2017-11-20 DIAGNOSIS — E1122 Type 2 diabetes mellitus with diabetic chronic kidney disease: Secondary | ICD-10-CM | POA: Diagnosis not present

## 2017-11-20 DIAGNOSIS — Z7984 Long term (current) use of oral hypoglycemic drugs: Secondary | ICD-10-CM | POA: Diagnosis not present

## 2017-11-20 DIAGNOSIS — M1990 Unspecified osteoarthritis, unspecified site: Secondary | ICD-10-CM | POA: Diagnosis not present

## 2017-11-20 DIAGNOSIS — D509 Iron deficiency anemia, unspecified: Secondary | ICD-10-CM | POA: Diagnosis not present

## 2017-11-20 DIAGNOSIS — I5033 Acute on chronic diastolic (congestive) heart failure: Secondary | ICD-10-CM | POA: Diagnosis not present

## 2017-11-20 DIAGNOSIS — N183 Chronic kidney disease, stage 3 (moderate): Secondary | ICD-10-CM | POA: Diagnosis not present

## 2017-11-20 DIAGNOSIS — J849 Interstitial pulmonary disease, unspecified: Secondary | ICD-10-CM | POA: Diagnosis not present

## 2017-11-22 DIAGNOSIS — I480 Paroxysmal atrial fibrillation: Secondary | ICD-10-CM | POA: Diagnosis not present

## 2017-11-22 DIAGNOSIS — E1122 Type 2 diabetes mellitus with diabetic chronic kidney disease: Secondary | ICD-10-CM | POA: Diagnosis not present

## 2017-11-22 DIAGNOSIS — Z7984 Long term (current) use of oral hypoglycemic drugs: Secondary | ICD-10-CM | POA: Diagnosis not present

## 2017-11-22 DIAGNOSIS — I5033 Acute on chronic diastolic (congestive) heart failure: Secondary | ICD-10-CM | POA: Diagnosis not present

## 2017-11-22 DIAGNOSIS — N183 Chronic kidney disease, stage 3 (moderate): Secondary | ICD-10-CM | POA: Diagnosis not present

## 2017-11-22 DIAGNOSIS — I130 Hypertensive heart and chronic kidney disease with heart failure and stage 1 through stage 4 chronic kidney disease, or unspecified chronic kidney disease: Secondary | ICD-10-CM | POA: Diagnosis not present

## 2017-11-22 DIAGNOSIS — J849 Interstitial pulmonary disease, unspecified: Secondary | ICD-10-CM | POA: Diagnosis not present

## 2017-11-22 DIAGNOSIS — D509 Iron deficiency anemia, unspecified: Secondary | ICD-10-CM | POA: Diagnosis not present

## 2017-11-22 DIAGNOSIS — M1990 Unspecified osteoarthritis, unspecified site: Secondary | ICD-10-CM | POA: Diagnosis not present

## 2017-11-25 ENCOUNTER — Encounter: Payer: Self-pay | Admitting: Internal Medicine

## 2017-11-25 ENCOUNTER — Ambulatory Visit (INDEPENDENT_AMBULATORY_CARE_PROVIDER_SITE_OTHER): Payer: Medicare Other | Admitting: Internal Medicine

## 2017-11-25 VITALS — BP 128/66 | HR 71 | Ht 62.0 in | Wt 152.4 lb

## 2017-11-25 DIAGNOSIS — I5032 Chronic diastolic (congestive) heart failure: Secondary | ICD-10-CM | POA: Diagnosis not present

## 2017-11-25 DIAGNOSIS — I4819 Other persistent atrial fibrillation: Secondary | ICD-10-CM

## 2017-11-25 DIAGNOSIS — I481 Persistent atrial fibrillation: Secondary | ICD-10-CM

## 2017-11-25 DIAGNOSIS — Z950 Presence of cardiac pacemaker: Secondary | ICD-10-CM

## 2017-11-25 LAB — CUP PACEART INCLINIC DEVICE CHECK
Battery Remaining Longevity: 131 mo
Battery Voltage: 3.15 V
Brady Statistic AP VP Percent: 0 %
Brady Statistic AP VS Percent: 0 %
Brady Statistic AS VP Percent: 99.99 %
Brady Statistic AS VS Percent: 0.01 %
Brady Statistic RA Percent Paced: 0 %
Brady Statistic RV Percent Paced: 99.99 %
Date Time Interrogation Session: 20190826183544
Implantable Lead Implant Date: 20190524
Implantable Lead Implant Date: 20190524
Implantable Lead Location: 753859
Implantable Lead Location: 753860
Implantable Lead Model: 3830
Implantable Lead Model: 5076
Implantable Pulse Generator Implant Date: 20190524
Lead Channel Impedance Value: 304 Ohm
Lead Channel Impedance Value: 361 Ohm
Lead Channel Impedance Value: 418 Ohm
Lead Channel Impedance Value: 475 Ohm
Lead Channel Pacing Threshold Amplitude: 0.75 V
Lead Channel Pacing Threshold Pulse Width: 0.4 ms
Lead Channel Sensing Intrinsic Amplitude: 9.5 mV
Lead Channel Setting Pacing Amplitude: 2.5 V
Lead Channel Setting Pacing Pulse Width: 0.4 ms
Lead Channel Setting Sensing Sensitivity: 4 mV

## 2017-11-25 NOTE — Patient Instructions (Signed)
Medication Instructions:  Your physician recommends that you continue on your current medications as directed. Please refer to the Current Medication list given to you today.  Labwork: None ordered.  Testing/Procedures: None ordered.  Follow-Up: Your physician wants you to follow-up in: 9 months with Dr. Lovena Le.   You will receive a reminder letter in the mail two months in advance. If you don't receive a letter, please call our office to schedule the follow-up appointment.  Remote monitoring is used to monitor your Pacemaker from home. This monitoring reduces the number of office visits required to check your device to one time per year. It allows [place] to keep an eye on the functioning of your device to ensure it is working properly. You are scheduled for a device check from home on 02/24/2018. You may send your transmission at any time that day. If you have a wireless device, the transmission will be sent automatically. After your physician reviews your transmission, you will receive a postcard with your next transmission date.  Any Other Special Instructions Will Be Listed Below (If Applicable).  If you need a refill on your cardiac medications before your next appointment, please call your pharmacy.

## 2017-11-25 NOTE — Progress Notes (Signed)
HPI Ann Fletcher returns today for followup of her atrial fib. She is a pleasant 84 yo woman with uncontrolled atrial fib who underwent AV node ablation and insertion of a DDD PM with atrial lead in the his bundle location. She has done well in the interim. She was noted to have non-capture of her His lead the day after her procedure. To obtain CHB, we had to ablate fairly close to her AV node. 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  Allergies  Allergen Reactions  . Pradaxa [Dabigatran Etexilate Mesylate] Nausea And Vomiting  . Nsaids Other (See Comments)    Stomach cramps  . Sulfa Antibiotics Nausea And Vomiting and Other (See Comments)    GI upset  . Betadine [Povidone Iodine] Itching     Current Outpatient Medications  Medication Sig Dispense Refill  . albuterol (PROVENTIL HFA;VENTOLIN HFA) 108 (90 BASE) MCG/ACT inhaler Inhale 2 puffs into the lungs 3 (three) times daily as needed for wheezing or shortness of breath.     [name] atorvastatin (LIPITOR) 40 MG tablet Take 40 mg by mouth at bedtime.     . Calcium Carbonate-Vitamin D (OSCAL 500/200 D-3 PO) Take 1 tablet by mouth daily.     [name] docusate sodium (COLACE) 100 MG capsule Take 1 capsule (100 mg total) by mouth 2 (two) times daily. 60 capsule 3  . ferrous sulfate 325 (65 FE) MG EC tablet Take 1 tablet (325 mg total) by mouth 3 (three) times daily with meals. 90 tablet 3  . fluticasone (FLONASE) 50 MCG/ACT nasal spray Place 1 spray into both nostrils daily as needed for allergies.     . furosemide (LASIX) 80 MG tablet Take 80 mg by mouth daily.    [name] levothyroxine (SYNTHROID, LEVOTHROID) 100 MCG tablet Take 100 mcg by mouth daily before breakfast.     . LORazepam (ATIVAN) 1 MG tablet Take 1 mg by mouth 3 (three) times daily as needed for anxiety.     . metFORMIN (GLUCOPHAGE-XR) 500 MG 24 hr tablet Take 1,000 mg by mouth 2 (two) times daily.     . pantoprazole (PROTONIX) 40 MG tablet Take 1 tablet (40 mg total) by mouth  daily. 30 tablet 3  . potassium chloride 20 MEQ TBCR Take 20 mEq by mouth daily. 30 tablet 3  . traMADol (ULTRAM) 50 MG tablet Take 50 mg by mouth every 8 (eight) hours as needed for moderate pain.     . vitamin E (VITAMIN E) 400 UNIT capsule Take 400 Units by mouth daily.     No current facility-administered medications for this visit.      Past Medical History:  Diagnosis Date  . Anxiety   . Arthritis   . AV node arrhythmia    a. s/p AV node ablation/PPM 07/2017.  [name] Chronic diastolic CHF (congestive heart failure) ([place])    a. Acute exacerbation occurred in setting of AF.  [name] CKD (chronic kidney disease), stage III ([place])   . Diabetes mellitus without complication ([place])    type 2  . H/O bladder infections   . Hyperlipidemia   . Hypertension   . Hypothyroidism   . Lower extremity edema   . Osteoporosis   . Persistent atrial fibrillation ([place])    a. diagnosed in 10/2014 by PCP, underwent failed TEE DCCV on 10/08/14, loaded with amio, successfully DCCV on 10/15/14.    ROS:   All systems reviewed and negative except as noted in the HPI.  Past Surgical History:  Procedure Laterality Date  . AV NODE ABLATION N/A 08/23/2017   Procedure: AV NODE ABLATION;  Surgeon: [name], MD;  Location: [place] [facility];  Service: Cardiovascular;  Laterality: N/A;  . BACK SURGERY  2001, 2003, 2006   x 3  . BIOPSY  11/06/2017   Procedure: BIOPSY;  Surgeon: [name], MD;  Location: [place];  Service: Endoscopy;;  . CARDIOVERSION N/A 10/08/2014   Procedure: CARDIOVERSION;  Surgeon: [name], MD;  Location: [place];  Service: Cardiovascular;  Laterality: N/A;  . CARDIOVERSION N/A 10/15/2014   Procedure: CARDIOVERSION;  Surgeon: [name], MD;  [facility]COPY;  Service: Cardiovascular;  Laterality: N/A;  . CARDIOVERSION N/A 11/15/2014   Procedure: CARDIOVERSION;  Surgeon: [name], MD;  Location: [place];  Service: Cardiovascular;  Laterality: N/A;  .  COLONOSCOPY N/A 11/06/2017   Procedure: COLONOSCOPY;  Surgeon: [name], MD;  Location: [facility] END[facility]COPY;  Service: Endoscopy;  Laterality: N/A;  . ESOPHAGOGASTRODUODENOSCOPY N/A 11/06/2017   Procedure: ESOPHAGOGASTRODUODENOSCOPY (EGD);  Surgeon: [name], MD;  Location: [facility] ENDOSCOPY;  Service: Endoscopy;  Laterality: N/A;  . PACEMAKER IMPLANT N/A 08/23/2017   Procedure: PACEMAKER IMPLANT;  Surgeon: [name], MD;  Location: [place] [facility];  Service: Cardiovascular;  Laterality: N/A;  . POLYPECTOMY  11/06/2017   Procedure: POLYPECTOMY;  Surgeon: [name], MD;  Location: [facility] ENDOSCOPY;  Service: Endoscopy;;  . 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 TEE WITHOUT CARDIOVERSION N/A 10/08/2014   Procedure: TRANSESOPHAGEAL ECHOCARDIOGRAM (TEE);  Surgeon: [name], MD;  Location: [facility] ENDOSCOPY;  Service: Cardiovascular;  Laterality: N/A;  . TUBAL LIGATION       Family History  Problem Relation Age of Onset  . Pneumonia Mother   . Heart attack Father   . Diabetes Sister      Social History   Socioeconomic History  . Marital status: Married    Spouse name: Not on file  . Number of children: Not on file  . Years of education: Not on file  . Highest education level: Not on file  Occupational History  . Not on file  Social Needs  . Financial resource strain: Not on file  . Food insecurity:    Worry: Not on file    Inability: Not on file  . Transportation needs:    Medical: Not on file    Non-medical: Not on file  Tobacco Use  . Smoking status: Never Smoker  . Smokeless tobacco: Never Used  Substance and Sexual Activity  . Alcohol use: No  . Drug use: No  . Sexual activity: Yes    Birth control/protection: Surgical  Lifestyle  . Physical activity:    Days per week: Not on file    Minutes per session: Not on file  . Stress: Not on file  Relationships  . Social connections:    Talks on phone: Not on file    Gets together: Not on file    Attends religious  service: Not on file    Active member of club or organization: Not on file    Attends meetings of clubs or organizations: Not on file    Relationship status: Not on file  . Intimate partner violence:    Fear of current or ex partner: Not on file    Emotionally abused: Not on file    Physically abused: Not on file    Forced sexual activity: Not on file  Other Topics Concern  .  Not on file  Social History Narrative  . Not on file     BP 128/66   Pulse 71   Ht 5\' 2"  (1.575 m)   Wt 152 lb 6.4 oz (69.1 kg)   SpO2 96%   BMI 27.87 kg/m   Physical Exam:  Well appearing 84 yo woman, NAD HEENT: Unremarkable Neck:  No JVD, no thyromegally Lymphatics:  No adenopathy Back:  No CVA tenderness Lungs:  Clear with no wheezes HEART:  Regular rate rhythm, no murmurs, no rubs, no clicks Abd:  soft, positive bowel sounds, no organomegally, no rebound, no guarding Ext:  2 plus pulses, no edema, no cyanosis, no clubbing Skin:  No rashes no nodules Neuro:  CN II through XII intact, motor grossly intact  EKG - atrial fib with ventricular pacing  DEVICE  Normal device function.  See PaceArt for details.   Assess/Plan: 1. Atrial fib - her rates are now well controlled and she feels well. She will continue her current meds. 2. PPM - her His bundle lead does not capture but her RV lead is working normally.  3. HTN - her blood pressure is well controlled. No change in her meds.  Mikle Bosworth.D.

## 2017-11-26 DIAGNOSIS — N183 Chronic kidney disease, stage 3 (moderate): Secondary | ICD-10-CM | POA: Diagnosis not present

## 2017-11-26 DIAGNOSIS — I130 Hypertensive heart and chronic kidney disease with heart failure and stage 1 through stage 4 chronic kidney disease, or unspecified chronic kidney disease: Secondary | ICD-10-CM | POA: Diagnosis not present

## 2017-11-26 DIAGNOSIS — I480 Paroxysmal atrial fibrillation: Secondary | ICD-10-CM | POA: Diagnosis not present

## 2017-11-26 DIAGNOSIS — D509 Iron deficiency anemia, unspecified: Secondary | ICD-10-CM | POA: Diagnosis not present

## 2017-11-26 DIAGNOSIS — Z7984 Long term (current) use of oral hypoglycemic drugs: Secondary | ICD-10-CM | POA: Diagnosis not present

## 2017-11-26 DIAGNOSIS — E1122 Type 2 diabetes mellitus with diabetic chronic kidney disease: Secondary | ICD-10-CM | POA: Diagnosis not present

## 2017-11-26 DIAGNOSIS — M1990 Unspecified osteoarthritis, unspecified site: Secondary | ICD-10-CM | POA: Diagnosis not present

## 2017-11-26 DIAGNOSIS — J849 Interstitial pulmonary disease, unspecified: Secondary | ICD-10-CM | POA: Diagnosis not present

## 2017-11-26 DIAGNOSIS — I5033 Acute on chronic diastolic (congestive) heart failure: Secondary | ICD-10-CM | POA: Diagnosis not present

## 2017-11-29 DIAGNOSIS — I130 Hypertensive heart and chronic kidney disease with heart failure and stage 1 through stage 4 chronic kidney disease, or unspecified chronic kidney disease: Secondary | ICD-10-CM | POA: Diagnosis not present

## 2017-11-29 DIAGNOSIS — I5033 Acute on chronic diastolic (congestive) heart failure: Secondary | ICD-10-CM | POA: Diagnosis not present

## 2017-11-29 DIAGNOSIS — E1122 Type 2 diabetes mellitus with diabetic chronic kidney disease: Secondary | ICD-10-CM | POA: Diagnosis not present

## 2017-11-29 DIAGNOSIS — J849 Interstitial pulmonary disease, unspecified: Secondary | ICD-10-CM | POA: Diagnosis not present

## 2017-11-29 DIAGNOSIS — I480 Paroxysmal atrial fibrillation: Secondary | ICD-10-CM | POA: Diagnosis not present

## 2017-11-29 DIAGNOSIS — M1990 Unspecified osteoarthritis, unspecified site: Secondary | ICD-10-CM | POA: Diagnosis not present

## 2017-11-29 DIAGNOSIS — D509 Iron deficiency anemia, unspecified: Secondary | ICD-10-CM | POA: Diagnosis not present

## 2017-11-29 DIAGNOSIS — N183 Chronic kidney disease, stage 3 (moderate): Secondary | ICD-10-CM | POA: Diagnosis not present

## 2017-11-29 DIAGNOSIS — Z7984 Long term (current) use of oral hypoglycemic drugs: Secondary | ICD-10-CM | POA: Diagnosis not present

## 2017-12-04 DIAGNOSIS — I130 Hypertensive heart and chronic kidney disease with heart failure and stage 1 through stage 4 chronic kidney disease, or unspecified chronic kidney disease: Secondary | ICD-10-CM | POA: Diagnosis not present

## 2017-12-04 DIAGNOSIS — I5033 Acute on chronic diastolic (congestive) heart failure: Secondary | ICD-10-CM | POA: Diagnosis not present

## 2017-12-04 DIAGNOSIS — N183 Chronic kidney disease, stage 3 (moderate): Secondary | ICD-10-CM | POA: Diagnosis not present

## 2017-12-04 DIAGNOSIS — M1990 Unspecified osteoarthritis, unspecified site: Secondary | ICD-10-CM | POA: Diagnosis not present

## 2017-12-04 DIAGNOSIS — D509 Iron deficiency anemia, unspecified: Secondary | ICD-10-CM | POA: Diagnosis not present

## 2017-12-04 DIAGNOSIS — J849 Interstitial pulmonary disease, unspecified: Secondary | ICD-10-CM | POA: Diagnosis not present

## 2017-12-04 DIAGNOSIS — I480 Paroxysmal atrial fibrillation: Secondary | ICD-10-CM | POA: Diagnosis not present

## 2017-12-04 DIAGNOSIS — E1122 Type 2 diabetes mellitus with diabetic chronic kidney disease: Secondary | ICD-10-CM | POA: Diagnosis not present

## 2017-12-04 DIAGNOSIS — Z7984 Long term (current) use of oral hypoglycemic drugs: Secondary | ICD-10-CM | POA: Diagnosis not present

## 2017-12-06 ENCOUNTER — Other Ambulatory Visit: Payer: Self-pay

## 2017-12-06 ENCOUNTER — Encounter: Payer: Self-pay | Admitting: Cardiology

## 2017-12-06 ENCOUNTER — Ambulatory Visit (INDEPENDENT_AMBULATORY_CARE_PROVIDER_SITE_OTHER): Payer: Medicare Other | Admitting: Cardiology

## 2017-12-06 VITALS — BP 156/71 | HR 82 | Ht 62.0 in | Wt 154.0 lb

## 2017-12-06 DIAGNOSIS — I5033 Acute on chronic diastolic (congestive) heart failure: Secondary | ICD-10-CM

## 2017-12-06 DIAGNOSIS — I4891 Unspecified atrial fibrillation: Secondary | ICD-10-CM | POA: Diagnosis not present

## 2017-12-06 NOTE — Progress Notes (Signed)
12/06/2017 Ann Fletcher   1932/09/12  [id number]  Primary Physician [name], MD Primary Cardiologist: Dr [name]  HPI:  84 y/o female followed by Dr [name] with a history of AF, HTN, DM, and COPD/ILD. She had been on Amiodarone but developed pulmonary fibrosis. Off Amiodarone she went back into AF with uncontrolled VR. She ultimately had an AVN RFA and pacemaker placed May 2019. She has struggled since. She was admitted 11/01/17 with CHF and significant anemia. She was diuresed from 72 kg to 69.6 kg. GI work up showed no obvious source of bleeding. The plan was to consider resuming Eliquis when her Hgb stabilized. She insisted on discharge 11/07/17. F/U Hgb 8/14 was unchanged 7.8. She was seen in the office 11/19/17 and was miserable. She was weak, SOB, and anorexic. She vomited in the office. I offered admission but she declined.   I had her return today for follow up. Fortunately she feels much better. She is still pale and a little weak. She denies SOB. I ordered labs today but I don't think she will agree to take Eliquis even if her Hgb is > 8.    Current Outpatient Medications  Medication Sig Dispense Refill  . albuterol (PROVENTIL HFA;VENTOLIN HFA) 108 (90 BASE) MCG/ACT inhaler Inhale 2 puffs into the lungs 3 (three) times daily as needed for wheezing or shortness of breath.     [name] atorvastatin (LIPITOR) 40 MG tablet Take 40 mg by mouth at bedtime.     . Calcium Carbonate-Vitamin D (OSCAL 500/200 D-3 PO) Take 1 tablet by mouth daily.     [name] docusate sodium (COLACE) 100 MG capsule Take 1 capsule (100 mg total) by mouth 2 (two) times daily. 60 capsule 3  . ferrous sulfate 325 (65 FE) MG EC tablet Take 1 tablet (325 mg total) by mouth 3 (three) times daily with meals. 90 tablet 3  . fluticasone (FLONASE) 50 MCG/ACT nasal spray Place 1 spray into both nostrils daily as needed for allergies.     . furosemide (LASIX) 80 MG tablet Take 80 mg by mouth daily.    [name] levothyroxine (SYNTHROID,  LEVOTHROID) 100 MCG tablet Take 100 mcg by mouth daily before breakfast.     . LORazepam (ATIVAN) 1 MG tablet Take 1 mg by mouth 3 (three) times daily as needed for anxiety.     . metFORMIN (GLUCOPHAGE-XR) 500 MG 24 hr tablet Take 1,000 mg by mouth 2 (two) times daily.     . pantoprazole (PROTONIX) 40 MG tablet Take 1 tablet (40 mg total) by mouth daily. 30 tablet 3  . potassium chloride 20 MEQ TBCR Take 20 mEq by mouth daily. 30 tablet 3  . traMADol (ULTRAM) 50 MG tablet Take 50 mg by mouth every 8 (eight) hours as needed for moderate pain.     . vitamin E (VITAMIN E) 400 UNIT capsule Take 400 Units by mouth daily.     No current facility-administered medications for this visit.     Allergies  Allergen Reactions  . Pradaxa [Dabigatran Etexilate Mesylate] Nausea And Vomiting  . Nsaids Other (See Comments)    Stomach cramps  . Sulfa Antibiotics Nausea And Vomiting and Other (See Comments)    GI upset  . Betadine [Povidone Iodine] Itching    Past Medical History:  Diagnosis Date  . Anxiety   . Arthritis   . AV node arrhythmia    a. s/p AV node ablation/PPM 07/2017.  [name] Chronic diastolic CHF (congestive heart failure) (  [place])    a. Acute exacerbation occurred in setting of AF.  [name] CKD (chronic kidney disease), stage III ([place])   . Diabetes mellitus without complication ([place])    type 2  . H/O bladder infections   . Hyperlipidemia   . Hypertension   . Hypothyroidism   . Lower extremity edema   . Osteoporosis   . Persistent atrial fibrillation ([place])    a. diagnosed in 10/2014 by PCP, underwent failed TEE DCCV on 10/08/14, loaded with amio, successfully DCCV on 10/15/14.    Social History   Socioeconomic History  . Marital status: Married    Spouse name: Not on file  . Number of children: Not on file  . Years of education: Not on file  . Highest education level: Not on file  Occupational History  . Not on file  Social Needs  . Financial resource strain: Not on file  . Food  insecurity:    Worry: Not on file    Inability: Not on file  . Transportation needs:    Medical: Not on file    Non-medical: Not on file  Tobacco Use  . Smoking status: Never Smoker  . Smokeless tobacco: Never Used  Substance and Sexual Activity  . Alcohol use: No  . Drug use: No  . Sexual activity: Yes    Birth control/protection: Surgical  Lifestyle  . Physical activity:    Days per week: Not on file    Minutes per session: Not on file  . Stress: Not on file  Relationships  . Social connections:    Talks on phone: Not on file    Gets together: Not on file    Attends religious service: Not on file    Active member of club or organization: Not on file    Attends meetings of clubs or organizations: Not on file    Relationship status: Not on file  . Intimate partner violence:    Fear of current or ex partner: Not on file    Emotionally abused: Not on file    Physically abused: Not on file    Forced sexual activity: Not on file  Other Topics Concern  . Not on file  Social History Narrative  . Not on file     Family History  Problem Relation Age of Onset  . Pneumonia Mother   . Heart attack Father   . Diabetes Sister      Review of Systems: General: negative for chills, fever, night sweats or weight changes.  Cardiovascular: negative for chest pain, dyspnea on exertion, edema, orthopnea, palpitations, paroxysmal nocturnal dyspnea or shortness of breath Dermatological: negative for rash Respiratory: negative for cough or wheezing Urologic: negative for hematuria Abdominal: negative for nausea, vomiting, diarrhea, bright red blood per rectum, melena, or hematemesis Neurologic: negative for visual changes, syncope, or dizziness All other systems reviewed and are otherwise negative except as noted above.    Blood pressure (!) 156/71, pulse 82, height 5\' 2"  (1.575 m), weight 154 lb (69.9 kg), SpO2 91 %.  General appearance: alert, cooperative and no distress Lungs:  kyphosis, clear Heart: regular rate and rhythm Extremities: no edema Skin: pale, cool, dry Neurologic: Grossly normal   ASSESSMENT AND PLAN:   Atrial fibrillation with RVR ([facility]) S/P AVN ablation and PTVDP May 2019  Anemia Iron deficiency anemia. No obvious bleeding with endoscopy and colonoscopy 11/06/17  Anorexia She has always had problems with medications and nausea. I suspect Iron is contibuting.  Interstitial lung disease (  [place]) ILD on CT and on exam  Essential hypertension Fair control   PLAN  Check labs but I would not resume Eliquis with her symptomatic anemia. I'll review with Dr [name].   Kerin Ransom PA-C 12/06/2017 3:35 PM

## 2017-12-06 NOTE — Patient Instructions (Addendum)
Medication Instructions:  Your physician recommends that you continue on your current medications as directed. Please refer to the Current Medication list given to you today.  If you need a refill on your cardiac medications before your next appointment, please call your pharmacy.  Labwork: Your physician recommends that you return for lab work in: [facility], [place] in our office  Testing/Procedures: None   Follow-Up: Your physician recommends that you schedule a follow-up appointment in: 3 months with Dr [name].  Any Other Special Instructions Will Be Listed Below (If Applicable).

## 2017-12-07 LAB — CBC
Hematocrit: 26.4 % — ABNORMAL LOW (ref 34.0–46.6)
Hemoglobin: 8 g/dL — ABNORMAL LOW (ref 11.1–15.9)
MCH: 22.9 pg — ABNORMAL LOW (ref 26.6–33.0)
MCHC: 30.3 g/dL — ABNORMAL LOW (ref 31.5–35.7)
MCV: 75 fL — ABNORMAL LOW (ref 79–97)
Platelets: 178 10*3/uL (ref 150–450)
RBC: 3.5 x10E6/uL — ABNORMAL LOW (ref 3.77–5.28)
RDW: 16.8 % — ABNORMAL HIGH (ref 12.3–15.4)
WBC: 7.1 10*3/uL (ref 3.4–10.8)

## 2017-12-07 LAB — BASIC METABOLIC PANEL
BUN/Creatinine Ratio: 29 — ABNORMAL HIGH (ref 12–28)
BUN: 40 mg/dL — ABNORMAL HIGH (ref 8–27)
CO2: 25 mmol/L (ref 20–29)
Calcium: 9.9 mg/dL (ref 8.7–10.3)
Chloride: 96 mmol/L (ref 96–106)
Creatinine, Ser: 1.39 mg/dL — ABNORMAL HIGH (ref 0.57–1.00)
GFR calc Af Amer: 40 mL/min/{1.73_m2} — ABNORMAL LOW (ref >59)
GFR calc non Af Amer: 35 mL/min/{1.73_m2} — ABNORMAL LOW (ref >59)
Glucose: 115 mg/dL — ABNORMAL HIGH (ref 65–99)
Potassium: 4.7 mmol/L (ref 3.5–5.2)
Sodium: 139 mmol/L (ref 134–144)

## 2017-12-09 ENCOUNTER — Other Ambulatory Visit: Payer: Self-pay | Admitting: Medical

## 2017-12-10 ENCOUNTER — Other Ambulatory Visit: Payer: Self-pay

## 2017-12-10 MED ORDER — FUROSEMIDE 80 MG PO TABS: ORAL_TABLET | ORAL | 0 refills | Status: DC

## 2017-12-10 NOTE — Telephone Encounter (Signed)
[name] is the primary cardiologist.

## 2017-12-20 ENCOUNTER — Telehealth: Payer: Self-pay | Admitting: Cardiovascular Disease

## 2017-12-20 DIAGNOSIS — I509 Heart failure, unspecified: Secondary | ICD-10-CM | POA: Diagnosis not present

## 2017-12-20 DIAGNOSIS — K5790 Diverticulosis of intestine, part unspecified, without perforation or abscess without bleeding: Secondary | ICD-10-CM | POA: Insufficient documentation

## 2017-12-20 DIAGNOSIS — Z86010 Personal history of colonic polyps: Secondary | ICD-10-CM | POA: Insufficient documentation

## 2017-12-20 DIAGNOSIS — D509 Iron deficiency anemia, unspecified: Secondary | ICD-10-CM | POA: Diagnosis not present

## 2017-12-20 NOTE — Telephone Encounter (Signed)
New Message   Pt c/o swelling: STAT is pt has developed SOB within 24 hours  1) How much weight have you gained and in what time span? 12 lbs   2) If swelling, where is the swelling located? Feet, Legs  3) Are you currently taking a fluid pill? Yes, furosemide (LASIX) 80 MG tablet  4) Are you currently SOB? Some   5) Do you have a log of your daily weights (if so, list)? No   6) Have you gained 3 pounds in a day or 5 pounds in a week?  Yes   7) Have you traveled recently? No

## 2017-12-20 NOTE — Telephone Encounter (Signed)
Spoke with pt. Pt sts that since 9/5 she has had weight gain of 12lbs on her home scales. She does weigh herself daily She denies orthopnea, she reports a non-poductive cough. Her LE swelling  And SOB is stable. She does report that abdomen is distended and tight.  She was instucted on 9/10 to reduce her Lasix to 40mg  daily except  M.W,F take 32mf daily due to an elevation in her creatinine. She did not make the change and has continue to take 80mg  daily. She denies increased sodium or fluid intake.  She is scheduled to see Almyra Deforest, PA on 9/25.   Adv pt that I will talk with the DOD in our officeto adv and call her back

## 2017-12-20 NOTE — Telephone Encounter (Signed)
Reviewed with our office DOD Dr.Hilty. Pt should increase Lasix to 80mg  twice daily until she is seen by Almyra Deforest, PA on 9/25.  Adv pt to contact the office if symptoms worsen. Pt verbalized understanding to the instructions given.

## 2017-12-25 ENCOUNTER — Encounter: Payer: Self-pay | Admitting: Physician Assistant

## 2017-12-25 ENCOUNTER — Ambulatory Visit (INDEPENDENT_AMBULATORY_CARE_PROVIDER_SITE_OTHER): Payer: Medicare Other | Admitting: Physician Assistant

## 2017-12-25 ENCOUNTER — Ambulatory Visit
Admission: RE | Admit: 2017-12-25 | Discharge: 2017-12-25 | Disposition: A | Discharge status: Home / Self Care | Payer: Medicare Other | Source: Ambulatory Visit | Attending: Physician Assistant | Admitting: Physician Assistant

## 2017-12-25 VITALS — BP 148/64 | HR 72 | Ht 62.0 in | Wt 155.0 lb

## 2017-12-25 DIAGNOSIS — I10 Essential (primary) hypertension: Secondary | ICD-10-CM

## 2017-12-25 DIAGNOSIS — E119 Type 2 diabetes mellitus without complications: Secondary | ICD-10-CM | POA: Diagnosis not present

## 2017-12-25 DIAGNOSIS — I481 Persistent atrial fibrillation: Secondary | ICD-10-CM

## 2017-12-25 DIAGNOSIS — R059 Cough, unspecified: Secondary | ICD-10-CM

## 2017-12-25 DIAGNOSIS — D649 Anemia, unspecified: Secondary | ICD-10-CM

## 2017-12-25 DIAGNOSIS — J8410 Pulmonary fibrosis, unspecified: Secondary | ICD-10-CM

## 2017-12-25 DIAGNOSIS — R05 Cough: Secondary | ICD-10-CM | POA: Diagnosis not present

## 2017-12-25 DIAGNOSIS — I4819 Other persistent atrial fibrillation: Secondary | ICD-10-CM

## 2017-12-25 MED ORDER — BENZONATATE 100 MG PO CAPS: 100.0000 mg | ORAL_CAPSULE | Freq: Three times a day (TID) | ORAL | 0 refills | Status: DC | PRN

## 2017-12-25 MED ORDER — FUROSEMIDE 80 MG PO TABS: 80.0000 mg | ORAL_TABLET | Freq: Two times a day (BID) | ORAL | 6 refills | Status: DC

## 2017-12-25 MED ORDER — POTASSIUM CHLORIDE ER 20 MEQ PO TBCR: 20.0000 meq | EXTENDED_RELEASE_TABLET | Freq: Two times a day (BID) | ORAL | 6 refills | Status: DC

## 2017-12-25 NOTE — Progress Notes (Signed)
Cardiology Office Note    Date:  12/27/2017   ID:  Ann Fletcher, DOB 05/11/1932, MRN [id number]  PCP:  [name], MD  Cardiologist:  [name] Electrophysiologist: Dr. Lovena Le   Chief Complaint  Patient presents with  . Follow-up    seen for [name].    History of Present Illness:  Ann Fletcher is a 84 y.o. female with PMH of HTN, DM, COPD/ILD and atrial fibrillation.  She was on amiodarone but developed pulmonary fibrosis.  After she was taken off of amiodarone, she went back into atrial fibrillation with uncontrolled ventricular rate.  She ultimately had AV nodal ablation and pacemaker placement in May 2019.  She had CHF and a significant anemia in August 2019.  She was diuresed from 72 kg down to 69.6 kg.  GI work-up did not show obvious sign of bleeding.  The original plan is to consider resuming Eliquis when her hemoglobin stabilized.  She insisted to be discharged on 8/8.  Follow-up hemoglobin on 8/14 was unchanged at 7.8.  She was seen again on 8/20 however had severe weakness, shortness of breath and vomiting in the office.  It was recommended for her to be admitted, however she refused.  She was last seen by Kerin Ransom on 12/06/2017, she was feeling a little bit better at that time.  Hemoglobin stable at 8.0.  Lasix was reduced to 80 mg on Monday Wednesday Friday and 40 mg on other days.  Last device interrogation was on 11/25/2017 which showed normal device function.  Patient presents today for cardiology office visit.  The last office visit, patient has been accumulating fluid.  Instead of alternating diuretic, she is actually taking Lasix 80 mg daily instead.  I will increase her Lasix to 80 mg twice daily.  I also increased her potassium supplement to twice daily dosing as well.  She also has bibasilar crackles on physical exam.  I recommended a two-view chest x-ray.  She will return in 1 week for further assessment.  Otherwise she denies any obvious chest  discomfort.    Past Medical History:  Diagnosis Date  . Anxiety   . Arthritis   . AV node arrhythmia    a. s/p AV node ablation/PPM 07/2017.  [name] Chronic diastolic CHF (congestive heart failure) ([place])    a. Acute exacerbation occurred in setting of AF.  [name] CKD (chronic kidney disease), stage III ([place])   . Diabetes mellitus without complication ([place])    type 2  . H/O bladder infections   . Hyperlipidemia   . Hypertension   . Hypothyroidism   . Lower extremity edema   . Osteoporosis   . Persistent atrial fibrillation ([place])    a. diagnosed in 10/2014 by PCP, underwent failed TEE DCCV on 10/08/14, loaded with amio, successfully DCCV on 10/15/14.    Past Surgical History:  Procedure Laterality Date  . AV NODE ABLATION N/A 08/23/2017   Procedure: AV NODE ABLATION;  Surgeon: [name], MD;  Location: [facility];  Service: Cardiovascular;  Laterality: N/A;  . BACK SURGERY  2001, 2003, 2006   x 3  . BIOPSY  11/06/2017   Procedure: BIOPSY;  Surgeon: [name], MD;  Location: [facility];  Service: Endoscopy;;  . CARDIOVERSION N/A 10/08/2014   Procedure: CARDIOVERSION;  Surgeon: [name], MD;  Location: [facility];  Service: Cardiovascular;  Laterality: N/A;  . CARDIOVERSION N/A 10/15/2014   Procedure: CARDIOVERSION;  Surgeon: [name], MD;  Location: [facility];  Service: Cardiovascular;  Laterality: N/A;  . CARDIOVERSION N/A 11/15/2014   Procedure: CARDIOVERSION;  Surgeon: [name], MD;  Location: [facility];  Service: Cardiovascular;  Laterality: N/A;  . COLONOSCOPY N/A 11/06/2017   Procedure: COLONOSCOPY;  Surgeon: [name], MD;  Location: [facility];  Service: Endoscopy;  Laterality: N/A;  . ESOPHAGOGASTRODUODENOSCOPY N/A 11/06/2017   Procedure: ESOPHAGOGASTRODUODENOSCOPY (EGD);  Surgeon: [name], MD;  [facility];  Service: Endoscopy;  Laterality: N/A;  . PACEMAKER IMPLANT N/A 08/23/2017   Procedure: PACEMAKER IMPLANT;  Surgeon:  [name], MD;  Location: [facility];  Service: Cardiovascular;  Laterality: N/A;  . POLYPECTOMY  11/06/2017   Procedure: POLYPECTOMY;  Surgeon: [name], MD;  Location: [facility] ENDOSCOPY;  Service: Endoscopy;;  . 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 TEE WITHOUT CARDIOVERSION N/A 10/08/2014   Procedure: TRANSESOPHAGEAL ECHOCARDIOGRAM (TEE);  Surgeon: [name], MD;  Location: [facility];  Service: Cardiovascular;  Laterality: N/A;  . TUBAL LIGATION      Current Medications: Outpatient Medications Prior to Visit  Medication Sig Dispense Refill  . albuterol (PROVENTIL HFA;VENTOLIN HFA) 108 (90 BASE) MCG/ACT inhaler Inhale 2 puffs into the lungs 3 (three) times daily as needed for wheezing or shortness of breath.     [name] atorvastatin (LIPITOR) 40 MG tablet Take 40 mg by mouth at bedtime.     . Calcium Carbonate-Vitamin D (OSCAL 500/200 D-3 PO) Take 1 tablet by mouth daily.     . cephALEXin (KEFLEX) 500 MG capsule Take 500 mg by mouth 3 (three) times daily. For 10 days    . docusate sodium (COLACE) 100 MG capsule Take 1 capsule (100 mg total) by mouth 2 (two) times daily. 60 capsule 3  . ferrous sulfate 325 (65 FE) MG EC tablet Take 1 tablet (325 mg total) by mouth 3 (three) times daily with meals. 90 tablet 3  . fluticasone (FLONASE) 50 MCG/ACT nasal spray Place 1 spray into both nostrils daily as needed for allergies.     [name] levothyroxine (SYNTHROID, LEVOTHROID) 100 MCG tablet Take 100 mcg by mouth daily before breakfast.     . LORazepam (ATIVAN) 1 MG tablet Take 1 mg by mouth 3 (three) times daily as needed for anxiety.     . metFORMIN (GLUCOPHAGE-XR) 500 MG 24 hr tablet Take 1,000 mg by mouth 2 (two) times daily.     . pantoprazole (PROTONIX) 40 MG tablet Take 1 tablet (40 mg total) by mouth daily. 30 tablet 3  . traMADol (ULTRAM) 50 MG tablet Take 50 mg by mouth every 8 (eight) hours as needed for moderate pain.     . vitamin E (VITAMIN E) 400 UNIT capsule Take 400 Units by  mouth daily.    . furosemide (LASIX) 80 MG tablet Take 80 mg by mouth 2 (two) times daily.    . potassium chloride 20 MEQ TBCR Take 20 mEq by mouth daily. 30 tablet 3  . furosemide (LASIX) 80 MG tablet TAKE ONE TABLET DAILY (Patient not taking: Reported on 12/25/2017) 30 tablet 5  . furosemide (LASIX) 80 MG tablet Take 80mg  on MWF and take 40 on T, TH, S, S (Patient not taking: Reported on 12/25/2017) 45 tablet 0   No facility-administered medications prior to visit.      Allergies:   Pradaxa [dabigatran etexilate mesylate]; Nsaids; Sulfa antibiotics; Betadine [povidone iodine]; and Povidone-iodine   Social History   Socioeconomic History  . Marital status: Married  Spouse name: Not on file  . Number of children: Not on file  . Years of education: Not on file  . Highest education level: Not on file  Occupational History  . Not on file  Social Needs  . Financial resource strain: Not on file  . Food insecurity:    Worry: Not on file    Inability: Not on file  . Transportation needs:    Medical: Not on file    Non-medical: Not on file  Tobacco Use  . Smoking status: Never Smoker  . Smokeless tobacco: Never Used  Substance and Sexual Activity  . Alcohol use: No  . Drug use: No  . Sexual activity: Yes    Birth control/protection: Surgical  Lifestyle  . Physical activity:    Days per week: Not on file    Minutes per session: Not on file  . Stress: Not on file  Relationships  . Social connections:    Talks on phone: Not on file    Gets together: Not on file    Attends religious service: Not on file    Active member of club or organization: Not on file    Attends meetings of clubs or organizations: Not on file    Relationship status: Not on file  Other Topics Concern  . Not on file  Social History Narrative  . Not on file     Family History:  The patient's family history includes Diabetes in her sister; Heart attack in her father; Pneumonia in her mother.   ROS:    Please see the history of present illness.    ROS All other systems reviewed and are negative.   PHYSICAL EXAM:   VS:  BP (!) 148/64   Pulse 72   Ht 5\' 2"  (1.575 m)   Wt 155 lb (70.3 kg)   SpO2 95%   BMI 28.35 kg/m    GEN: Well nourished, well developed, in no acute distress  HEENT: normal  Neck: no JVD, carotid bruits, or masses Cardiac: RRR; no murmurs, rubs, or gallops,no edema  Respiratory:  clear to auscultation bilaterally, normal work of breathing GI: soft, nontender, nondistended, + BS MS: no deformity or atrophy  Skin: warm and dry, no rash Neuro:  Alert and Oriented x 3, Strength and sensation are intact Psych: euthymic mood, full affect  Wt Readings from Last 3 Encounters:  12/25/17 155 lb (70.3 kg)  12/06/17 154 lb (69.9 kg)  11/25/17 152 lb 6.4 oz (69.1 kg)      Studies/Labs Reviewed:   EKG:  EKG is not ordered today.    Recent Labs: 07/04/2017: TSH 0.553 10/31/2017: ALT 14; B Natriuretic Peptide 582.0 12/06/2017: Hemoglobin 8.0; Platelets 178 12/25/2017: BUN 31; Creatinine, Ser 1.35; Potassium 4.7; Sodium 140   Lipid Panel No results found for: CHOL, TRIG, HDL, CHOLHDL, VLDL, LDLCALC, LDLDIRECT  Additional studies/ records that were reviewed today include:   Echo 11/01/2017 LV EF: 60% -   65% Study Conclusions  - Left ventricle: The cavity size was normal. There was severe   concentric hypertrophy. Systolic function was normal. The   estimated ejection fraction was in the range of 60% to 65%. Wall   motion was normal; there were no regional wall motion   abnormalities. The study is not technically sufficient to allow   evaluation of LV diastolic function. - Aortic valve: Trileaflet; mildly thickened, mildly calcified   leaflets. - Mitral valve: There was mild regurgitation. - Right ventricle: The cavity size was  moderately dilated. Wall   thickness was normal. The moderator band was prominent. Pacer   wire or catheter noted in right ventricle.  Systolic function was   mildly to moderately reduced. - Right atrium: The atrium was moderately dilated. Pacer wire or   catheter noted in right atrium. - Tricuspid valve: There was moderate regurgitation. - Pericardium, extracardiac: A trivial, free-flowing pericardial   effusion was identified circumferential to the heart. The fluid   had no internal echoes.There was no evidence of hemodynamic   compromise.    ASSESSMENT:    1. Cough   2. Persistent atrial fibrillation ([place])   3. Essential hypertension   4. Controlled type 2 diabetes mellitus without complication, without long-term current use of insulin ([place])   5. Pulmonary fibrosis ([place])   6. Anemia, unspecified type      PLAN:  In order of problems listed above:  1. Cough: Refill Tessalon Perles.  Obtain 2 view chest x-ray to make sure bibasilar crackle is not coming from pulmonary edema.  Symptom concerning for volume overload, will increase Lasix to 80 mg twice daily and also increase potassium to twice daily dosing as well.  Will obtain basic metabolic panel and reassess in 1 week.  2. Persistent atrial fibrillation: Continue iron therapy, not on systemic anticoagulation due to significant bleeding.  3. Hypertension: Blood pressure mildly elevated.  She is not on any blood pressure medication.  4. DM2: Managed by primary care provider  5. Pulmonary fibrosis: Bibasilar crackles noted on physical exam.  6. Anemia: Recheck hemoglobin on follow-up.  May need to rediscussed systemic anticoagulation at the time.    Medication Adjustments/Labs and Tests Ordered: Current medicines are reviewed at length with the patient today.  Concerns regarding medicines are outlined above.  Medication changes, Labs and Tests ordered today are listed in the Patient Instructions below. Patient Instructions  Medication Instructions:  START Tesslon Pearls Take 1 tablet three times a day as needed INCREASE Lasix to 80 mg take 1 tablet twice  a day INCREASE Potassium Take 1 tablet twice a day  If you need a refill on your cardiac medications before your next appointment, please call your pharmacy.  Labwork: Your physician recommends that you return for lab work in: [employer] in our office  Testing/Procedures: A chest x-ray takes a picture of the organs and structures inside the chest, including the heart, lungs, and blood vessels. This test can show several things, including, whether the heart is enlarges; whether fluid is building up in the lungs; and whether pacemaker / defibrillator leads are still in place. [facility]   Follow-Up: Your physician recommends that you schedule a follow-up appointment in: 1 week with Almyra Deforest, PA-C  Any Other Special Instructions Will Be Listed Below (If Applicable).          Hilbert Corrigan, [place]  12/27/2017 11:46 PM    [facility] [address], [place], [place]  [address] Phone: [phone]; Fax: [phone]

## 2017-12-25 NOTE — Patient Instructions (Signed)
Medication Instructions:  START Tesslon Pearls Take 1 tablet three times a day as needed INCREASE Lasix to 80 mg take 1 tablet twice a day INCREASE Potassium Take 1 tablet twice a day  If you need a refill on your cardiac medications before your next appointment, please call your pharmacy.  Labwork: Your physician recommends that you return for lab work in: [employer] in our office  Testing/Procedures: A chest x-ray takes a picture of the organs and structures inside the chest, including the heart, lungs, and blood vessels. This test can show several things, including, whether the heart is enlarges; whether fluid is building up in the lungs; and whether pacemaker / defibrillator leads are still in place. [facility]   Follow-Up: Your physician recommends that you schedule a follow-up appointment in: 1 week with Almyra Deforest, PA-C  Any Other Special Instructions Will Be Listed Below (If Applicable).

## 2017-12-26 LAB — BASIC METABOLIC PANEL
BUN/Creatinine Ratio: 23 (ref 12–28)
BUN: 31 mg/dL — ABNORMAL HIGH (ref 8–27)
CO2: 29 mmol/L (ref 20–29)
Calcium: 9.8 mg/dL (ref 8.7–10.3)
Chloride: 94 mmol/L — ABNORMAL LOW (ref 96–106)
Creatinine, Ser: 1.35 mg/dL — ABNORMAL HIGH (ref 0.57–1.00)
GFR calc Af Amer: 42 mL/min/{1.73_m2} — ABNORMAL LOW (ref >59)
GFR calc non Af Amer: 36 mL/min/{1.73_m2} — ABNORMAL LOW (ref >59)
Glucose: 149 mg/dL — ABNORMAL HIGH (ref 65–99)
Potassium: 4.7 mmol/L (ref 3.5–5.2)
Sodium: 140 mmol/L (ref 134–144)

## 2017-12-27 ENCOUNTER — Encounter: Payer: Self-pay | Admitting: Hematology & Oncology

## 2017-12-27 ENCOUNTER — Encounter: Payer: Self-pay | Admitting: Physician Assistant

## 2017-12-27 DIAGNOSIS — M1990 Unspecified osteoarthritis, unspecified site: Secondary | ICD-10-CM | POA: Diagnosis not present

## 2017-12-27 DIAGNOSIS — E1122 Type 2 diabetes mellitus with diabetic chronic kidney disease: Secondary | ICD-10-CM | POA: Diagnosis not present

## 2017-12-27 DIAGNOSIS — I5033 Acute on chronic diastolic (congestive) heart failure: Secondary | ICD-10-CM | POA: Diagnosis not present

## 2017-12-27 DIAGNOSIS — Z7984 Long term (current) use of oral hypoglycemic drugs: Secondary | ICD-10-CM | POA: Diagnosis not present

## 2017-12-27 DIAGNOSIS — I10 Essential (primary) hypertension: Secondary | ICD-10-CM | POA: Diagnosis not present

## 2017-12-27 DIAGNOSIS — N183 Chronic kidney disease, stage 3 (moderate): Secondary | ICD-10-CM | POA: Diagnosis not present

## 2017-12-27 DIAGNOSIS — J849 Interstitial pulmonary disease, unspecified: Secondary | ICD-10-CM | POA: Diagnosis not present

## 2017-12-27 DIAGNOSIS — I480 Paroxysmal atrial fibrillation: Secondary | ICD-10-CM | POA: Diagnosis not present

## 2017-12-27 DIAGNOSIS — I130 Hypertensive heart and chronic kidney disease with heart failure and stage 1 through stage 4 chronic kidney disease, or unspecified chronic kidney disease: Secondary | ICD-10-CM | POA: Diagnosis not present

## 2017-12-27 DIAGNOSIS — D509 Iron deficiency anemia, unspecified: Secondary | ICD-10-CM | POA: Diagnosis not present

## 2017-12-27 NOTE — Progress Notes (Signed)
Kidney function and electrolyte stable.

## 2017-12-27 NOTE — Progress Notes (Signed)
Chest x ray good, without fluid or sign of infection.

## 2017-12-31 ENCOUNTER — Ambulatory Visit: Payer: Medicare Other | Admitting: Podiatry

## 2018-01-02 ENCOUNTER — Encounter: Payer: Self-pay | Admitting: Physician Assistant

## 2018-01-02 ENCOUNTER — Ambulatory Visit (INDEPENDENT_AMBULATORY_CARE_PROVIDER_SITE_OTHER): Payer: Medicare Other | Admitting: Physician Assistant

## 2018-01-02 VITALS — BP 146/72 | HR 86 | Ht 62.0 in | Wt 148.5 lb

## 2018-01-02 DIAGNOSIS — I5032 Chronic diastolic (congestive) heart failure: Secondary | ICD-10-CM

## 2018-01-02 DIAGNOSIS — J849 Interstitial pulmonary disease, unspecified: Secondary | ICD-10-CM | POA: Diagnosis not present

## 2018-01-02 DIAGNOSIS — D649 Anemia, unspecified: Secondary | ICD-10-CM

## 2018-01-02 DIAGNOSIS — I4819 Other persistent atrial fibrillation: Secondary | ICD-10-CM | POA: Diagnosis not present

## 2018-01-02 DIAGNOSIS — Z950 Presence of cardiac pacemaker: Secondary | ICD-10-CM

## 2018-01-02 DIAGNOSIS — E119 Type 2 diabetes mellitus without complications: Secondary | ICD-10-CM

## 2018-01-02 DIAGNOSIS — I10 Essential (primary) hypertension: Secondary | ICD-10-CM | POA: Diagnosis not present

## 2018-01-02 MED ORDER — POTASSIUM CHLORIDE CRYS ER 20 MEQ PO TBCR: 20.0000 meq | EXTENDED_RELEASE_TABLET | Freq: Two times a day (BID) | ORAL | Status: DC

## 2018-01-02 NOTE — Patient Instructions (Addendum)
Medication Instructions:  Your physician recommends that you continue on your current medications as directed. Please refer to the Current Medication list given to you today.  RESUME Potassium 6mEq twice daily  Labwork: Bmet today  Testing/Procedures: None ordered  Follow-Up: Follow up as planned with Dr.Berry on 03/11/18 @2pm   Any Other Special Instructions Will Be Listed Below (If Applicable).     If you need a refill on your cardiac medications before your next appointment, please call your pharmacy.

## 2018-01-02 NOTE — Progress Notes (Signed)
Cardiology Office Note    Date:  01/02/2018   ID:  Ann Fletcher, DOB Jan 20, 1933, MRN [id number]  PCP:  [name], MD  Cardiologist:  [name] Electrophysiologist: Dr. Lovena Le  Chief Complaint  Patient presents with  . Follow-up    seen for [name].     History of Present Illness:  Ann Fletcher is a 84 y.o. female with PMH of HTN, DM, COPD/ILD and atrial fibrillation s/p AVN ablation and PPM 08/23/2017.  She was on amiodarone but developed pulmonary fibrosis.  After she was taken off of amiodarone, she went back into atrial fibrillation with uncontrolled ventricular rate.  She ultimately had AV nodal ablation and pacemaker placement in May 2019.  She had CHF and a significant anemia in August 2019.  She was diuresed from 72 kg down to 69.6 kg.  GI work-up did not show obvious sign of bleeding.  The original plan is to consider resuming Eliquis when her hemoglobin stabilized.  She insisted to be discharged on 8/8.  Follow-up hemoglobin on 8/14 was unchanged at 7.8.  She was seen again on 8/20 however had severe weakness, shortness of breath and vomiting in the office.  It was recommended for her to be admitted, however she refused.  She was last seen by Kerin Ransom on 12/06/2017, she was feeling a little bit better at that time.  Hemoglobin stable at 8.0.  Lasix was reduced to 80 mg on Monday Wednesday Friday and 40 mg on other days.  Last device interrogation was on 11/25/2017 which showed normal device function.  Patient was last seen on 12/25/2017, she was accumulating fluid at the time, instead of alternating 80 and 40 mg every other day of Lasix, she has been taking Lasix 80 mg daily instead.  I increased her Lasix to 80 mg twice daily and also her potassium supplement as well.  Patient presents today for cardiology office visit.  She lost 7 pounds on the higher dose of diuretic, however she has not been taking her potassium as instructed.  I instructed her to restart on the  potassium.  I will obtain a basic metabolic panel today to recheck her renal function and electrolyte.  She did vomit this morning after taking iron therapy.  I am concerned about low potassium in this case.  Otherwise she denies any chest pain.  Her breathing is a low bit better as well.    Past Medical History:  Diagnosis Date  . Anxiety   . Arthritis   . AV node arrhythmia    a. s/p AV node ablation/PPM 07/2017.  [name] Chronic diastolic CHF (congestive heart failure) ([place])    a. Acute exacerbation occurred in setting of AF.  [name] CKD (chronic kidney disease), stage III ([place])   . Diabetes mellitus without complication ([place])    type 2  . H/O bladder infections   . Hyperlipidemia   . Hypertension   . Hypothyroidism   . Lower extremity edema   . Osteoporosis   . Persistent atrial fibrillation    a. diagnosed in 10/2014 by PCP, underwent failed TEE DCCV on 10/08/14, loaded with amio, successfully DCCV on 10/15/14.    Past Surgical History:  Procedure Laterality Date  . AV NODE ABLATION N/A 08/23/2017   Procedure: AV NODE ABLATION;  Surgeon: [name], MD;  Location: [place] [facility];  Service: Cardiovascular;  Laterality: N/A;  . BACK SURGERY  2001, 2003, 2006   x 3  . BIOPSY  11/06/2017   Procedure: BIOPSY;  Surgeon: [name], MD;  Location: [place];  Service: Endoscopy;;  . CARDIOVERSION N/A 10/08/2014   Procedure: CARDIOVERSION;  Surgeon: [name], MD;  Location: [place];  Service: Cardiovascular;  Laterality: N/A;  . CARDIOVERSION N/A 10/15/2014   Procedure: CARDIOVERSION;  Surgeon: [name], MD;  Location: [facility] ENDOSCOPY;  Service: Cardiovascular;  Laterality: N/A;  . CARDIOVERSION N/A 11/15/2014   Procedure: CARDIOVERSION;  Surgeon: [name], MD;  Location: [place];  Service: Cardiovascular;  Laterality: N/A;  . COLONOSCOPY N/A 11/06/2017   Procedure: COLONOSCOPY;  Surgeon: [name], MD;  Location: [facility] ENDOSCOPY;  Service: Endoscopy;   Laterality: N/A;  . ESOPHAGOGASTRODUODENOSCOPY N/A 11/06/2017   Procedure: ESOPHAGOGASTRODUODENOSCOPY (EGD);  Surgeon: [name], MD;  Location: [facility] ENDOSCOPY;  Service: Endoscopy;  Laterality: N/A;  . PACEMAKER IMPLANT N/A 08/23/2017   Procedure: PACEMAKER IMPLANT;  Surgeon: [name], MD;  Location: [place] [facility];  Service: Cardiovascular;  Laterality: N/A;  . POLYPECTOMY  11/06/2017   Procedure: POLYPECTOMY;  Surgeon: [name], MD;  Location: [facility] ENDOSCOPY;  Service: Endoscopy;;  . 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 TEE WITHOUT CARDIOVERSION N/A 10/08/2014   Procedure: TRANSESOPHAGEAL ECHOCARDIOGRAM (TEE);  Surgeon: [name], MD;  Location: [place];  Service: Cardiovascular;  Laterality: N/A;  . TUBAL LIGATION      Current Medications: Outpatient Medications Prior to Visit  Medication Sig Dispense Refill  . albuterol (PROVENTIL HFA;VENTOLIN HFA) 108 (90 BASE) MCG/ACT inhaler Inhale 2 puffs into the lungs 3 (three) times daily as needed for wheezing or shortness of breath.     [name] atorvastatin (LIPITOR) 40 MG tablet Take 40 mg by mouth at bedtime.     . benzonatate (TESSALON PERLES) 100 MG capsule Take 1 capsule (100 mg total) by mouth 3 (three) times daily as needed for cough. 60 capsule 0  . Calcium Carbonate-Vitamin D (OSCAL 500/200 D-3 PO) Take 1 tablet by mouth daily.     . cephALEXin (KEFLEX) 500 MG capsule Take 500 mg by mouth 3 (three) times daily. For 10 days    . docusate sodium (COLACE) 100 MG capsule Take 1 capsule (100 mg total) by mouth 2 (two) times daily. 60 capsule 3  . ferrous sulfate 325 (65 FE) MG EC tablet Take 1 tablet (325 mg total) by mouth 3 (three) times daily with meals. 90 tablet 3  . fluticasone (FLONASE) 50 MCG/ACT nasal spray Place 1 spray into both nostrils daily as needed for allergies.     . furosemide (LASIX) 80 MG tablet Take 1 tablet (80 mg total) by mouth 2 (two) times daily. 60 tablet 6  . levothyroxine (SYNTHROID, LEVOTHROID)  100 MCG tablet Take 100 mcg by mouth daily before breakfast.     . LORazepam (ATIVAN) 1 MG tablet Take 1 mg by mouth 3 (three) times daily as needed for anxiety.     . metFORMIN (GLUCOPHAGE-XR) 500 MG 24 hr tablet Take 1,000 mg by mouth 2 (two) times daily.     . pantoprazole (PROTONIX) 40 MG tablet Take 1 tablet (40 mg total) by mouth daily. 30 tablet 3  . traMADol (ULTRAM) 50 MG tablet Take 50 mg by mouth every 8 (eight) hours as needed for moderate pain.     . vitamin E (VITAMIN E) 400 UNIT capsule Take 400 Units by mouth daily.    . Potassium Chloride ER 20 MEQ TBCR Take 20 mEq by mouth 2 (two) times daily. (Patient  not taking: Reported on 01/02/2018) 60 tablet 6   No facility-administered medications prior to visit.      Allergies:   Pradaxa [dabigatran etexilate mesylate]; Nsaids; Sulfa antibiotics; Betadine [povidone iodine]; and Povidone-iodine   Social History   Socioeconomic History  . Marital status: Married    Spouse name: Not on file  . Number of children: Not on file  . Years of education: Not on file  . Highest education level: Not on file  Occupational History  . Not on file  Social Needs  . Financial resource strain: Not on file  . Food insecurity:    Worry: Not on file    Inability: Not on file  . Transportation needs:    Medical: Not on file    Non-medical: Not on file  Tobacco Use  . Smoking status: Never Smoker  . Smokeless tobacco: Never Used  Substance and Sexual Activity  . Alcohol use: No  . Drug use: No  . Sexual activity: Yes    Birth control/protection: Surgical  Lifestyle  . Physical activity:    Days per week: Not on file    Minutes per session: Not on file  . Stress: Not on file  Relationships  . Social connections:    Talks on phone: Not on file    Gets together: Not on file    Attends religious service: Not on file    Active member of club or organization: Not on file    Attends meetings of clubs or organizations: Not on file     Relationship status: Not on file  Other Topics Concern  . Not on file  Social History Narrative  . Not on file     Family History:  The patient's family history includes Diabetes in her sister; Heart attack in her father; Pneumonia in her mother.   ROS:   Please see the history of present illness.    ROS All other systems reviewed and are negative.   PHYSICAL EXAM:   VS:  BP (!) 146/72   Pulse 86   Ht 5\' 2"  (1.575 m)   Wt 148 lb 8 oz (67.4 kg)   SpO2 95%   BMI 27.16 kg/m    GEN: Well nourished, well developed, in no acute distress  HEENT: normal  Neck: no JVD, carotid bruits, or masses Cardiac: RRR; no murmurs, rubs, or gallops,no edema  Respiratory:  clear to auscultation bilaterally, normal work of breathing GI: soft, nontender, nondistended, + BS MS: no deformity or atrophy  Skin: warm and dry, no rash Neuro:  Alert and Oriented x 3, Strength and sensation are intact Psych: euthymic mood, full affect  Wt Readings from Last 3 Encounters:  01/02/18 148 lb 8 oz (67.4 kg)  12/25/17 155 lb (70.3 kg)  12/06/17 154 lb (69.9 kg)      Studies/Labs Reviewed:   EKG:  EKG is not ordered today.    Recent Labs: 07/04/2017: TSH 0.553 10/31/2017: ALT 14; B Natriuretic Peptide 582.0 12/06/2017: Hemoglobin 8.0; Platelets 178 12/25/2017: BUN 31; Creatinine, Ser 1.35; Potassium 4.7; Sodium 140   Lipid Panel No results found for: CHOL, TRIG, HDL, CHOLHDL, VLDL, LDLCALC, LDLDIRECT  Additional studies/ records that were reviewed today include:   Echo 11/01/2017 LV EF: 60% -   65% Study Conclusions  - Left ventricle: The cavity size was normal. There was severe   concentric hypertrophy. Systolic function was normal. The   estimated ejection fraction was in the range of 60% to 65%. Wall  motion was normal; there were no regional wall motion   abnormalities. The study is not technically sufficient to allow   evaluation of LV diastolic function. - Aortic valve: Trileaflet; mildly  thickened, mildly calcified   leaflets. - Mitral valve: There was mild regurgitation. - Right ventricle: The cavity size was moderately dilated. Wall   thickness was normal. The moderator band was prominent. Pacer   wire or catheter noted in right ventricle. Systolic function was   mildly to moderately reduced. - Right atrium: The atrium was moderately dilated. Pacer wire or   catheter noted in right atrium. - Tricuspid valve: There was moderate regurgitation. - Pericardium, extracardiac: A trivial, free-flowing pericardial   effusion was identified circumferential to the heart. The fluid   had no internal echoes.There was no evidence of hemodynamic   compromise.     ASSESSMENT:    1. Chronic diastolic CHF (congestive heart failure) ([place])   2. Essential hypertension   3. Controlled type 2 diabetes mellitus without complication, without long-term current use of insulin ([place])   4. Interstitial lung disease (HCC)   5. Persistent atrial fibrillation   6. Anemia, unspecified type   7. Pacemaker      PLAN:  In order of problems listed above:  1. Chronic diastolic heart failure: During the last office visit, I increased her Lasix to 80 mg twice daily and also doubled her potassium supplement as well.  She lost roughly 7 pounds.  I will obtain a basic metabolic panel today.  She apparently has not been taking any of the potassium supplement, I will obtain a basic metabolic panel to make sure her potassium is not low  2. Hypertension: Blood pressure stable.  3. DM2: Managed by primary care provider  4. Interstitial lung disease: Related to amiodarone therapy.  Amiodarone has been stopped.  5. Persistent atrial fibrillation: Continue iron therapy, not on systemic anticoagulation due to significant bleeding. S/p AV nodal ablation and dual-chamber pacemaker placement.  6. Anemia: Recheck CBC on follow-up.  If hemoglobin improved, may need to rediscuss systemic  anticoagulation.    Medication Adjustments/Labs and Tests Ordered: Current medicines are reviewed at length with the patient today.  Concerns regarding medicines are outlined above.  Medication changes, Labs and Tests ordered today are listed in the Patient Instructions below. Patient Instructions  Medication Instructions:  Your physician recommends that you continue on your current medications as directed. Please refer to the Current Medication list given to you today.  RESUME Potassium 59mEq twice daily  Labwork: Bmet today  Testing/Procedures: None ordered  Follow-Up: Follow up as planned with Dr.Berry on 03/11/18 @2pm   Any Other Special Instructions Will Be Listed Below (If Applicable).     If you need a refill on your cardiac medications before your next appointment, please call your pharmacy.      Hilbert Corrigan, [place]  01/02/2018 6:15 PM    [facility] [place], [place], [place]  [address] Phone: [phone]; Fax: [phone]

## 2018-01-03 LAB — BASIC METABOLIC PANEL
BUN/Creatinine Ratio: 19 (ref 12–28)
BUN: 23 mg/dL (ref 8–27)
CO2: 28 mmol/L (ref 20–29)
Calcium: 10.3 mg/dL (ref 8.7–10.3)
Chloride: 92 mmol/L — ABNORMAL LOW (ref 96–106)
Creatinine, Ser: 1.2 mg/dL — ABNORMAL HIGH (ref 0.57–1.00)
GFR calc Af Amer: 48 mL/min/{1.73_m2} — ABNORMAL LOW (ref >59)
GFR calc non Af Amer: 42 mL/min/{1.73_m2} — ABNORMAL LOW (ref >59)
Glucose: 123 mg/dL — ABNORMAL HIGH (ref 65–99)
Potassium: 4.6 mmol/L (ref 3.5–5.2)
Sodium: 138 mmol/L (ref 134–144)

## 2018-01-06 ENCOUNTER — Other Ambulatory Visit: Payer: Self-pay

## 2018-01-06 DIAGNOSIS — I10 Essential (primary) hypertension: Secondary | ICD-10-CM

## 2018-01-06 DIAGNOSIS — I5033 Acute on chronic diastolic (congestive) heart failure: Secondary | ICD-10-CM

## 2018-01-13 DIAGNOSIS — I482 Chronic atrial fibrillation, unspecified: Secondary | ICD-10-CM | POA: Diagnosis not present

## 2018-01-13 DIAGNOSIS — E1121 Type 2 diabetes mellitus with diabetic nephropathy: Secondary | ICD-10-CM | POA: Diagnosis not present

## 2018-01-20 DIAGNOSIS — E1165 Type 2 diabetes mellitus with hyperglycemia: Secondary | ICD-10-CM | POA: Diagnosis not present

## 2018-01-20 DIAGNOSIS — E1121 Type 2 diabetes mellitus with diabetic nephropathy: Secondary | ICD-10-CM | POA: Diagnosis not present

## 2018-01-20 DIAGNOSIS — Z23 Encounter for immunization: Secondary | ICD-10-CM | POA: Diagnosis not present

## 2018-01-20 DIAGNOSIS — I10 Essential (primary) hypertension: Secondary | ICD-10-CM | POA: Diagnosis not present

## 2018-01-30 ENCOUNTER — Encounter: Payer: Self-pay | Admitting: Hematology & Oncology

## 2018-01-30 ENCOUNTER — Inpatient Hospital Stay: Payer: Medicare Other

## 2018-01-30 ENCOUNTER — Other Ambulatory Visit: Payer: Self-pay | Admitting: Family

## 2018-01-30 ENCOUNTER — Inpatient Hospital Stay: Payer: Medicare Other | Attending: Hematology & Oncology | Admitting: Hematology & Oncology

## 2018-01-30 ENCOUNTER — Other Ambulatory Visit: Payer: Self-pay

## 2018-01-30 VITALS — BP 155/67 | HR 86 | Temp 98.1°F | Resp 18 | Wt 147.0 lb

## 2018-01-30 DIAGNOSIS — E119 Type 2 diabetes mellitus without complications: Secondary | ICD-10-CM | POA: Diagnosis not present

## 2018-01-30 DIAGNOSIS — I130 Hypertensive heart and chronic kidney disease with heart failure and stage 1 through stage 4 chronic kidney disease, or unspecified chronic kidney disease: Secondary | ICD-10-CM | POA: Diagnosis not present

## 2018-01-30 DIAGNOSIS — M810 Age-related osteoporosis without current pathological fracture: Secondary | ICD-10-CM | POA: Diagnosis not present

## 2018-01-30 DIAGNOSIS — Z79899 Other long term (current) drug therapy: Secondary | ICD-10-CM

## 2018-01-30 DIAGNOSIS — E039 Hypothyroidism, unspecified: Secondary | ICD-10-CM

## 2018-01-30 DIAGNOSIS — I5032 Chronic diastolic (congestive) heart failure: Secondary | ICD-10-CM

## 2018-01-30 DIAGNOSIS — E785 Hyperlipidemia, unspecified: Secondary | ICD-10-CM | POA: Diagnosis not present

## 2018-01-30 DIAGNOSIS — I4891 Unspecified atrial fibrillation: Secondary | ICD-10-CM | POA: Diagnosis not present

## 2018-01-30 DIAGNOSIS — N183 Chronic kidney disease, stage 3 (moderate): Secondary | ICD-10-CM | POA: Diagnosis not present

## 2018-01-30 DIAGNOSIS — D500 Iron deficiency anemia secondary to blood loss (chronic): Secondary | ICD-10-CM

## 2018-01-30 DIAGNOSIS — F419 Anxiety disorder, unspecified: Secondary | ICD-10-CM

## 2018-01-30 DIAGNOSIS — Z7984 Long term (current) use of oral hypoglycemic drugs: Secondary | ICD-10-CM | POA: Diagnosis not present

## 2018-01-30 DIAGNOSIS — M129 Arthropathy, unspecified: Secondary | ICD-10-CM

## 2018-01-30 DIAGNOSIS — Z87440 Personal history of urinary (tract) infections: Secondary | ICD-10-CM

## 2018-01-30 LAB — CMP (CANCER CENTER ONLY)
ALT: 12 U/L (ref 10–47)
AST: 26 U/L (ref 11–38)
Albumin: 3.7 g/dL (ref 3.5–5.0)
Alkaline Phosphatase: 62 U/L (ref 26–84)
Anion gap: 6 (ref 5–15)
BUN: 34 mg/dL — ABNORMAL HIGH (ref 7–22)
CO2: 25 mmol/L (ref 18–33)
Calcium: 10.1 mg/dL (ref 8.0–10.3)
Chloride: 104 mmol/L (ref 98–108)
Creatinine: 1.6 mg/dL — ABNORMAL HIGH (ref 0.60–1.20)
Glucose, Bld: 122 mg/dL — ABNORMAL HIGH (ref 73–118)
Potassium: 5.2 mmol/L — ABNORMAL HIGH (ref 3.3–4.7)
Sodium: 135 mmol/L (ref 128–145)
Total Bilirubin: 0.7 mg/dL (ref 0.2–1.6)
Total Protein: 7.5 g/dL (ref 6.4–8.1)

## 2018-01-30 LAB — CBC WITH DIFFERENTIAL (CANCER CENTER ONLY)
Band Neutrophils: 0 %
Basophils Absolute: 0 10*3/uL (ref 0.0–0.1)
Basophils Relative: 0 %
Eosinophils Absolute: 0.1 10*3/uL (ref 0.0–0.5)
Eosinophils Relative: 2 %
HCT: 26.6 % — ABNORMAL LOW (ref 36.0–46.0)
Hemoglobin: 7.8 g/dL — ABNORMAL LOW (ref 12.0–15.0)
Lymphocytes Relative: 16 %
Lymphs Abs: 1.3 10*3/uL (ref 0.7–4.0)
MCH: 22.6 pg — ABNORMAL LOW (ref 26.0–34.0)
MCHC: 29.3 g/dL — ABNORMAL LOW (ref 30.0–36.0)
MCV: 77.1 fL — ABNORMAL LOW (ref 80.0–100.0)
Monocytes Absolute: 0.7 10*3/uL (ref 0.1–1.0)
Monocytes Relative: 9 %
Neutro Abs: 6 10*3/uL (ref 1.7–7.7)
Neutrophils Relative %: 73 %
Platelet Count: 177 10*3/uL (ref 150–400)
RBC: 3.45 MIL/uL — ABNORMAL LOW (ref 3.87–5.11)
RDW: 18.6 % — ABNORMAL HIGH (ref 11.5–15.5)
WBC Count: 8.2 10*3/uL (ref 4.0–10.5)
nRBC: 0 % (ref 0.0–0.2)

## 2018-01-30 LAB — SAMPLE TO BLOOD BANK

## 2018-01-30 LAB — SAVE SMEAR(SSMR), FOR PROVIDER SLIDE REVIEW

## 2018-01-30 LAB — RETICULOCYTES
Immature Retic Fract: 16.6 % — ABNORMAL HIGH (ref 2.3–15.9)
RBC.: 3.45 MIL/uL — ABNORMAL LOW (ref 3.87–5.11)
Retic Count, Absolute: 40 10*3/uL (ref 19.0–186.0)
Retic Ct Pct: 1.2 % (ref 0.4–3.1)

## 2018-01-30 NOTE — Progress Notes (Signed)
Referral MD  Reason for Referral: Iron deficiency anemia  Chief Complaint  Patient presents with  . New Patient (Initial Visit)  : I was told that I was anemic.  HPI: Ann Fletcher is a very charming 84 year old white female.  She is originally from [place].  Para she comes in with 1 of my church members who is 1 of her friends.  She has anemia.  She has been followed for this.  She has seen gastroenterology.  Dr. Earlie Raveling, as always, has been incredibly thorough in his work-up.  He did a endoscopy on her.  He felt that she needed to see [place] for an evaluation.  She has had no obvious bleeding.  She had the EGD and colonoscopy.  These were done in August 2019.  She has some mild gastritis.  She had a polyp in the distal sigmoid colon.  She has had iron studies done recently.  Her iron studies that were done in early August showed a ferritin of 37 with iron saturation of only 4%.  She does have long-standing diabetes.  She is not on insulin as of yet.  There has been no weight loss.  She is not a vegetarian.  She has had no cough.  She does not chew ice.  She has had no leg swelling.  She has had a pacemaker placed.  The pacemaker was for, I think, was for arrhythmia.  Of note, she does have some mild renal insufficiency.  We were asked to see her to try to help with her anemia.  Her last CBC that was done back in early September showed a white cell count 7.1.  Hemoglobin 8 and platelet count 178,000.  Her MCV was 75.  Overall, I said her performance status is probably ECOG 3.   Past Medical History:  Diagnosis Date  . Anxiety   . Arthritis   . AV node arrhythmia    a. s/p AV node ablation/PPM 07/2017.  [name] Chronic diastolic CHF (congestive heart failure) ([place])    a. Acute exacerbation occurred in setting of AF.  [name] CKD (chronic kidney disease), stage III ([place])   . Diabetes mellitus without complication ([place])    type 2  . H/O bladder infections   . Hyperlipidemia   .  Hypertension   . Hypothyroidism   . Lower extremity edema   . Osteoporosis   . Persistent atrial fibrillation    a. diagnosed in 10/2014 by PCP, underwent failed TEE DCCV on 10/08/14, loaded with amio, successfully DCCV on 10/15/14.  :  Past Surgical History:  Procedure Laterality Date  . AV NODE ABLATION N/A 08/23/2017   Procedure: AV NODE ABLATION;  Surgeon: [name], MD;  Location: [place] [facility];  Service: Cardiovascular;  Laterality: N/A;  . BACK SURGERY  2001, 2003, 2006   x 3  . BIOPSY  11/06/2017   Procedure: BIOPSY;  Surgeon: [name], MD;  Location: [place];  Service: Endoscopy;;  . CARDIOVERSION N/A 10/08/2014   Procedure: CARDIOVERSION;  Surgeon: [name], MD;  Location: [place];  Service: Cardiovascular;  Laterality: N/A;  . CARDIOVERSION N/A 10/15/2014   Procedure: CARDIOVERSION;  Surgeon: [name], MD;  Location: [facility] ENDOSCOPY;  Service: Cardiovascular;  Laterality: N/A;  . CARDIOVERSION N/A 11/15/2014   Procedure: CARDIOVERSION;  Surgeon: [name], MD;  Location: [place];  Service: Cardiovascular;  Laterality: N/A;  . COLONOSCOPY N/A 11/06/2017   Procedure: COLONOSCOPY;  Surgeon: [name], MD;  Location:  [place] ENDOSCOPY;  Service: Endoscopy;  Laterality: N/A;  . ESOPHAGOGASTRODUODENOSCOPY N/A 11/06/2017   Procedure: ESOPHAGOGASTRODUODENOSCOPY (EGD);  Surgeon: [name], MD;  Location: [facility] ENDOSCOPY;  Service: Endoscopy;  Laterality: N/A;  . PACEMAKER IMPLANT N/A 08/23/2017   Procedure: PACEMAKER IMPLANT;  Surgeon: [name], MD;  Location: [place] [facility];  Service: Cardiovascular;  Laterality: N/A;  . POLYPECTOMY  11/06/2017   Procedure: POLYPECTOMY;  Surgeon: [name], MD;  Location: [facility] ENDOSCOPY;  Service: Endoscopy;;  . 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 TEE WITHOUT CARDIOVERSION N/A 10/08/2014   Procedure: TRANSESOPHAGEAL ECHOCARDIOGRAM (TEE);  Surgeon: [name], MD;  Location: [place];  Service:  Cardiovascular;  Laterality: N/A;  . TUBAL LIGATION    :   Current Outpatient Medications:  .  albuterol (PROVENTIL HFA;VENTOLIN HFA) 108 (90 BASE) MCG/ACT inhaler, Inhale 2 puffs into the lungs 3 (three) times daily as needed for wheezing or shortness of breath. , Disp: , Rfl:  .  atorvastatin (LIPITOR) 40 MG tablet, Take 40 mg by mouth at bedtime. , Disp: , Rfl:  .  benzonatate (TESSALON PERLES) 100 MG capsule, Take 1 capsule (100 mg total) by mouth 3 (three) times daily as needed for cough., Disp: 60 capsule, Rfl: 0 .  Calcium Carbonate-Vitamin D (OSCAL 500/200 D-3 PO), Take 1 tablet by mouth daily. , Disp: , Rfl:  .  cephALEXin (KEFLEX) 500 MG capsule, Take 500 mg by mouth 3 (three) times daily. For 10 days, Disp: , Rfl:  .  docusate sodium (COLACE) 100 MG capsule, Take 1 capsule (100 mg total) by mouth 2 (two) times daily., Disp: 60 capsule, Rfl: 3 .  ferrous sulfate 325 (65 FE) MG EC tablet, Take 1 tablet (325 mg total) by mouth 3 (three) times daily with meals., Disp: 90 tablet, Rfl: 3 .  fluticasone (FLONASE) 50 MCG/ACT nasal spray, Place 1 spray into both nostrils daily as needed for allergies. , Disp: , Rfl:  .  furosemide (LASIX) 80 MG tablet, Take 1 tablet (80 mg total) by mouth 2 (two) times daily., Disp: 60 tablet, Rfl: 6 .  levothyroxine (SYNTHROID, LEVOTHROID) 100 MCG tablet, Take 100 mcg by mouth daily before breakfast. , Disp: , Rfl:  .  LORazepam (ATIVAN) 1 MG tablet, Take 1 mg by mouth 3 (three) times daily as needed for anxiety. , Disp: , Rfl:  .  metFORMIN (GLUCOPHAGE-XR) 500 MG 24 hr tablet, Take 1,000 mg by mouth 2 (two) times daily. , Disp: , Rfl:  .  pantoprazole (PROTONIX) 40 MG tablet, Take 1 tablet (40 mg total) by mouth daily., Disp: 30 tablet, Rfl: 3 .  potassium chloride SA (K-DUR,KLOR-CON) 20 MEQ tablet, Take 1 tablet (20 mEq total) by mouth daily., Disp: 90 tablet, Rfl: 3 .  traMADol (ULTRAM) 50 MG tablet, Take 50 mg by mouth every 8 (eight) hours as needed for  moderate pain. , Disp: , Rfl:  .  vitamin E (VITAMIN E) 400 UNIT capsule, Take 400 Units by mouth daily., Disp: , Rfl: :  :  Allergies  Allergen Reactions  . Pradaxa [Dabigatran Etexilate Mesylate] Nausea And Vomiting  . Nsaids Other (See Comments) and Itching    Stomach cramps Stomach cramps  . Sulfa Antibiotics Nausea And Vomiting and Other (See Comments)    GI upset  . Betadine [Povidone Iodine] Itching  . Povidone-Iodine Rash  :  Family History  Problem Relation Age of Onset  . Pneumonia Mother   . Heart attack Father   .  Diabetes Sister   :  Social History   Socioeconomic History  . Marital status: Married    Spouse name: Not on file  . Number of children: Not on file  . Years of education: Not on file  . Highest education level: Not on file  Occupational History  . Not on file  Social Needs  . Financial resource strain: Not on file  . Food insecurity:    Worry: Not on file    Inability: Not on file  . Transportation needs:    Medical: Not on file    Non-medical: Not on file  Tobacco Use  . Smoking status: Never Smoker  . Smokeless tobacco: Never Used  Substance and Sexual Activity  . Alcohol use: No  . Drug use: No  . Sexual activity: Yes    Birth control/protection: Surgical  Lifestyle  . Physical activity:    Days per week: Not on file    Minutes per session: Not on file  . Stress: Not on file  Relationships  . Social connections:    Talks on phone: Not on file    Gets together: Not on file    Attends religious service: Not on file    Active member of club or organization: Not on file    Attends meetings of clubs or organizations: Not on file    Relationship status: Not on file  . Intimate partner violence:    Fear of current or ex partner: Not on file    Emotionally abused: Not on file    Physically abused: Not on file    Forced sexual activity: Not on file  Other Topics Concern  . Not on file  Social History Narrative  . Not on file   :  Review of Systems  Constitutional: Positive for malaise/fatigue.  HENT: Negative.   Eyes: Negative.   Respiratory: Negative.   Cardiovascular: Negative.   Gastrointestinal: Negative.   Genitourinary: Negative.   Musculoskeletal: Negative.   Skin: Negative.   Neurological: Negative.   Endo/Heme/Allergies: Negative.   Psychiatric/Behavioral: Negative.      Exam: Elderly white female in no obvious distress.  Vital signs show a temperature of 98.1.  Pulse 86.  Blood pressure 155/67.  Weight is 148 pounds.  Head neck exam shows a normocephalic atraumatic skull.  There are no ocular or oral lesions.  There are no palpable cervical or supraclavicular lymph nodes.  Lungs are clear bilaterally.  Cardiac exam slightly tachycardic and irregular.  She has no murmurs, rubs or bruits.  Abdomen is soft.  She has slightly obese.  She has no fluid wave.  There is no liver or spleen tip.  Extremities shows some mild chronic nonpitting edema.  Skin exam shows no rashes, ecchymoses or petechia.  Neurological exam shows no focal neurological deficits. @IPVITALS @   Recent Labs    01/30/18 1402  WBC 8.2  HGB 7.8*  HCT 26.6*  PLT 177   Recent Labs    01/30/18 1503  NA 135  K 5.2*  CL 104  CO2 25  GLUCOSE 122*  BUN 34*  CREATININE 1.60*  CALCIUM 10.1    Blood smear review: Mild anisocytosis and poikilocytosis.  There is no nucleated red blood cells.  There is some mild hypochromic red blood cells.  She has some poikilocytosis.  There is no teardrop cells.  I see no inclusion bodies.  There is no rouleaux formation.  White cells appear normal in morphology maturation.  She has no hypersegmented  polys.  Platelets are adequate number and size.  Pathology: None    Assessment and Plan: Ann Fletcher is a very charming 84 year old white female who has anemia.  I have to believe that this is can be multifactorial.  I know that she does have iron deficiency.  I also suspect that she may have  erythropoietin deficiency from the diabetes and mild renal insufficiency.  We will plan to try her on IV iron.  I will give her 2 doses as her iron is quite low.  We will then plan to see her back about a month after the IV iron.  If her hemoglobin is not risen adequately, then I will go ahead and try her on Aranesp or Procrit.  I do not think we have to do a bone marrow test on her.  It is possible that she may have underlying myelodysplasia.  However, I just do not think we have to pursue this aggressively at this point time.  I gave her a prayer blanket.  She is very thankful for this.  I spent a good 45 minutes with Ann Fletcher and my church member.  All the time spent face-to-face.  I help coordinate her iron treatments.  I explained how iron works intravenously.  I explained to her that she does not need to take any oral iron.  I will plan to see her back in another 5 weeks.

## 2018-01-31 ENCOUNTER — Telehealth: Payer: Self-pay | Admitting: *Deleted

## 2018-01-31 LAB — IRON AND TIBC
Iron: 31 ug/dL — ABNORMAL LOW (ref 41–142)
Saturation Ratios: 9 % — ABNORMAL LOW (ref 21–57)
TIBC: 343 ug/dL (ref 236–444)
UIBC: 312 ug/dL (ref 120–384)

## 2018-01-31 LAB — FERRITIN: Ferritin: 49 ng/mL (ref 11–307)

## 2018-01-31 LAB — ERYTHROPOIETIN: Erythropoietin: 35.9 m[IU]/mL — ABNORMAL HIGH (ref 2.6–18.5)

## 2018-01-31 NOTE — Telephone Encounter (Signed)
-----   Message from Volanda Napoleon, MD sent at 01/31/2018 12:36 PM EDT ----- Call her friend - Valene Bors --  The iron is very low!!!  The 2 doses of iron will help!!  Laurey Arrow

## 2018-01-31 NOTE — Telephone Encounter (Signed)
Patient's friend, Watt Climes notified per order of Dr. Marin Olp that the iron is very low and that the two doses of iron will help.  [name] appreciative of call and has no questions at this time.

## 2018-02-04 ENCOUNTER — Inpatient Hospital Stay: Payer: Medicare Other | Attending: Hematology & Oncology

## 2018-02-04 VITALS — BP 133/50 | HR 70 | Temp 98.2°F

## 2018-02-04 DIAGNOSIS — D500 Iron deficiency anemia secondary to blood loss (chronic): Secondary | ICD-10-CM | POA: Diagnosis not present

## 2018-02-04 DIAGNOSIS — Z79899 Other long term (current) drug therapy: Secondary | ICD-10-CM | POA: Diagnosis not present

## 2018-02-04 MED ORDER — SODIUM CHLORIDE 0.9 % IV SOLN
510.0000 mg | Freq: Once | INTRAVENOUS | Status: AC
  Administered 2018-02-04: 510 mg via INTRAVENOUS
  Filled 2018-02-04: qty 17

## 2018-02-04 MED ORDER — SODIUM CHLORIDE 0.9 % IV SOLN
Freq: Once | INTRAVENOUS | Status: AC
  Administered 2018-02-04: 13:00:00 via INTRAVENOUS
  Filled 2018-02-04: qty 250

## 2018-02-04 NOTE — Patient Instructions (Signed)

## 2018-02-07 ENCOUNTER — Inpatient Hospital Stay: Payer: Medicare Other

## 2018-02-07 ENCOUNTER — Other Ambulatory Visit: Payer: Self-pay

## 2018-02-07 VITALS — BP 149/50 | HR 77 | Temp 97.6°F | Resp 16

## 2018-02-07 DIAGNOSIS — D500 Iron deficiency anemia secondary to blood loss (chronic): Secondary | ICD-10-CM | POA: Diagnosis not present

## 2018-02-07 DIAGNOSIS — Z79899 Other long term (current) drug therapy: Secondary | ICD-10-CM | POA: Diagnosis not present

## 2018-02-07 MED ORDER — SODIUM CHLORIDE 0.9 % IV SOLN
510.0000 mg | Freq: Once | INTRAVENOUS | Status: AC
  Administered 2018-02-07: 510 mg via INTRAVENOUS
  Filled 2018-02-07: qty 17

## 2018-02-07 MED ORDER — SODIUM CHLORIDE 0.9 % IV SOLN
Freq: Once | INTRAVENOUS | Status: AC
  Administered 2018-02-07: 14:00:00 via INTRAVENOUS
  Filled 2018-02-07: qty 250

## 2018-02-07 NOTE — Patient Instructions (Signed)

## 2018-02-24 ENCOUNTER — Ambulatory Visit (INDEPENDENT_AMBULATORY_CARE_PROVIDER_SITE_OTHER): Payer: Medicare Other

## 2018-02-24 DIAGNOSIS — I4819 Other persistent atrial fibrillation: Secondary | ICD-10-CM

## 2018-02-24 DIAGNOSIS — I5032 Chronic diastolic (congestive) heart failure: Secondary | ICD-10-CM

## 2018-02-24 NOTE — Progress Notes (Signed)
Remote pacemaker transmission.   

## 2018-03-06 ENCOUNTER — Inpatient Hospital Stay: Payer: Medicare Other

## 2018-03-06 ENCOUNTER — Other Ambulatory Visit: Payer: Self-pay

## 2018-03-06 ENCOUNTER — Encounter: Payer: Self-pay | Admitting: Hematology & Oncology

## 2018-03-06 ENCOUNTER — Inpatient Hospital Stay: Payer: Medicare Other | Attending: Hematology & Oncology | Admitting: Hematology & Oncology

## 2018-03-06 VITALS — BP 177/70 | HR 84 | Temp 97.7°F | Resp 19 | Wt 140.4 lb

## 2018-03-06 DIAGNOSIS — Z79899 Other long term (current) drug therapy: Secondary | ICD-10-CM

## 2018-03-06 DIAGNOSIS — K909 Intestinal malabsorption, unspecified: Secondary | ICD-10-CM | POA: Diagnosis not present

## 2018-03-06 DIAGNOSIS — D500 Iron deficiency anemia secondary to blood loss (chronic): Secondary | ICD-10-CM | POA: Diagnosis not present

## 2018-03-06 LAB — CBC WITH DIFFERENTIAL (CANCER CENTER ONLY)
Abs Immature Granulocytes: 0.04 10*3/uL (ref 0.00–0.07)
Basophils Absolute: 0.1 10*3/uL (ref 0.0–0.1)
Basophils Relative: 1 %
Eosinophils Absolute: 0.1 10*3/uL (ref 0.0–0.5)
Eosinophils Relative: 1 %
HCT: 32.4 % — ABNORMAL LOW (ref 36.0–46.0)
Hemoglobin: 9.9 g/dL — ABNORMAL LOW (ref 12.0–15.0)
Immature Granulocytes: 1 %
Lymphocytes Relative: 24 %
Lymphs Abs: 2.1 10*3/uL (ref 0.7–4.0)
MCH: 25.6 pg — ABNORMAL LOW (ref 26.0–34.0)
MCHC: 30.6 g/dL (ref 30.0–36.0)
MCV: 83.7 fL (ref 80.0–100.0)
Monocytes Absolute: 0.9 10*3/uL (ref 0.1–1.0)
Monocytes Relative: 11 %
Neutro Abs: 5.5 10*3/uL (ref 1.7–7.7)
Neutrophils Relative %: 62 %
Platelet Count: 199 10*3/uL (ref 150–400)
RBC: 3.87 MIL/uL (ref 3.87–5.11)
RDW: 20.7 % — ABNORMAL HIGH (ref 11.5–15.5)
WBC Count: 8.7 10*3/uL (ref 4.0–10.5)
nRBC: 0 % (ref 0.0–0.2)

## 2018-03-06 LAB — CMP (CANCER CENTER ONLY)
ALT: 10 U/L (ref 0–44)
AST: 18 U/L (ref 15–41)
Albumin: 4.3 g/dL (ref 3.5–5.0)
Alkaline Phosphatase: 76 U/L (ref 38–126)
Anion gap: 8 (ref 5–15)
BUN: 39 mg/dL — ABNORMAL HIGH (ref 8–23)
CO2: 29 mmol/L (ref 22–32)
Calcium: 10.7 mg/dL — ABNORMAL HIGH (ref 8.9–10.3)
Chloride: 96 mmol/L — ABNORMAL LOW (ref 98–111)
Creatinine: 1.47 mg/dL — ABNORMAL HIGH (ref 0.44–1.00)
GFR, Est AFR Am: 38 mL/min — ABNORMAL LOW (ref >60)
GFR, Estimated: 33 mL/min — ABNORMAL LOW (ref >60)
Glucose, Bld: 122 mg/dL — ABNORMAL HIGH (ref 70–99)
Potassium: 5.1 mmol/L (ref 3.5–5.1)
Sodium: 133 mmol/L — ABNORMAL LOW (ref 135–145)
Total Bilirubin: 0.4 mg/dL (ref 0.3–1.2)
Total Protein: 8 g/dL (ref 6.5–8.1)

## 2018-03-06 LAB — RETICULOCYTES
Immature Retic Fract: 10 % (ref 2.3–15.9)
RBC.: 3.87 MIL/uL (ref 3.87–5.11)
Retic Count, Absolute: 39.9 10*3/uL (ref 19.0–186.0)
Retic Ct Pct: 1 % (ref 0.4–3.1)

## 2018-03-06 NOTE — Progress Notes (Signed)
Hematology and Oncology Follow Up Visit  Ann Fletcher [id number] 1932/09/05 84 y.o. 03/06/2018   Principle Diagnosis:   Iron deficiency anemia --iron malabsorption and chronic blood loss  Current Therapy:    IV iron as indicated     Interim History:  Ann Fletcher is back for second office visit.  We first saw her back in October.  At that time, I felt that she had iron deficiency as a problem.  We checked her iron studies at that time.  Her ferritin was 49 with an iron saturation of only 9%.  Her erythropoietin level is 36.  She feels better.  She got 2 doses of IV iron.  She received Feraheme.  Again this made her feel better.  Her hemoglobin is up to 9.9 today.  There is been no bleeding.  She is had no fever.  She is had no cough.  Overall, her performance status is ECOG 2.  Medications:  Current Outpatient Medications:  .  albuterol (PROVENTIL HFA;VENTOLIN HFA) 108 (90 BASE) MCG/ACT inhaler, Inhale 2 puffs into the lungs 3 (three) times daily as needed for wheezing or shortness of breath. , Disp: , Rfl:  .  atorvastatin (LIPITOR) 40 MG tablet, Take 40 mg by mouth at bedtime. , Disp: , Rfl:  .  benzonatate (TESSALON PERLES) 100 MG capsule, Take 1 capsule (100 mg total) by mouth 3 (three) times daily as needed for cough., Disp: 60 capsule, Rfl: 0 .  Calcium Carbonate-Vitamin D (OSCAL 500/200 D-3 PO), Take 1 tablet by mouth daily. , Disp: , Rfl:  .  docusate sodium (COLACE) 100 MG capsule, Take 1 capsule (100 mg total) by mouth 2 (two) times daily., Disp: 60 capsule, Rfl: 3 .  fluticasone (FLONASE) 50 MCG/ACT nasal spray, Place 1 spray into both nostrils daily as needed for allergies. , Disp: , Rfl:  .  furosemide (LASIX) 80 MG tablet, Take 1 tablet (80 mg total) by mouth 2 (two) times daily., Disp: 60 tablet, Rfl: 6 .  levothyroxine (SYNTHROID, LEVOTHROID) 100 MCG tablet, Take 100 mcg by mouth daily before breakfast. , Disp: , Rfl:  .  LORazepam (ATIVAN) 1 MG tablet, Take 1 mg by  mouth 3 (three) times daily as needed for anxiety. , Disp: , Rfl:  .  metFORMIN (GLUCOPHAGE-XR) 500 MG 24 hr tablet, Take 1,000 mg by mouth 2 (two) times daily. , Disp: , Rfl:  .  pantoprazole (PROTONIX) 40 MG tablet, Take 1 tablet (40 mg total) by mouth daily., Disp: 30 tablet, Rfl: 3 .  potassium chloride SA (K-DUR,KLOR-CON) 20 MEQ tablet, Take 1 tablet (20 mEq total) by mouth daily., Disp: 90 tablet, Rfl: 3 .  traMADol (ULTRAM) 50 MG tablet, Take 50 mg by mouth every 8 (eight) hours as needed for moderate pain. , Disp: , Rfl:  .  vitamin E (VITAMIN E) 400 UNIT capsule, Take 400 Units by mouth daily., Disp: , Rfl:   Allergies:  Allergies  Allergen Reactions  . Pradaxa [Dabigatran Etexilate Mesylate] Nausea And Vomiting  . Nsaids Other (See Comments) and Itching    Stomach cramps Stomach cramps  . Sulfa Antibiotics Nausea And Vomiting and Other (See Comments)    GI upset  . Betadine [Povidone Iodine] Itching  . Povidone-Iodine Rash    Past Medical History, Surgical history, Social history, and Family History were reviewed and updated.  Review of Systems: Review of Systems  Constitutional: Negative.   HENT:  Negative.   Eyes: Negative.   Respiratory: Negative.  Cardiovascular: Negative.   Gastrointestinal: Negative.   Endocrine: Negative.   Genitourinary: Negative.    Musculoskeletal: Negative.   Skin: Negative.   Neurological: Negative.   Hematological: Negative.   Psychiatric/Behavioral: Negative.     Physical Exam:  weight is 140 lb 6.4 oz (63.7 kg). Her oral temperature is 97.7 F (36.5 C). Her blood pressure is 177/70 (abnormal) and her pulse is 84. Her respiration is 19 and oxygen saturation is 99%.   Wt Readings from Last 3 Encounters:  03/06/18 140 lb 6.4 oz (63.7 kg)  01/30/18 147 lb (66.7 kg)  01/02/18 148 lb 8 oz (67.4 kg)    Physical Exam  Constitutional: She is oriented to person, place, and time.  HENT:  Head: Normocephalic and atraumatic.    Mouth/Throat: Oropharynx is clear and moist.  Eyes: Pupils are equal, round, and reactive to light. EOM are normal.  Neck: Normal range of motion.  Cardiovascular: Normal rate, regular rhythm and normal heart sounds.  Pulmonary/Chest: Effort normal and breath sounds normal.  Abdominal: Soft. Bowel sounds are normal.  Musculoskeletal: Normal range of motion. She exhibits no edema, tenderness or deformity.  Lymphadenopathy:    She has no cervical adenopathy.  Neurological: She is alert and oriented to person, place, and time.  Skin: Skin is warm and dry. No rash noted. No erythema.  Psychiatric: She has a normal mood and affect. Her behavior is normal. Judgment and thought content normal.  Vitals reviewed.    Lab Results  Component Value Date   WBC 8.7 03/06/2018   HGB 9.9 (L) 03/06/2018   HCT 32.4 (L) 03/06/2018   MCV 83.7 03/06/2018   PLT 199 03/06/2018     Chemistry      Component Value Date/Time   NA 133 (L) 03/06/2018 1312   NA 138 01/02/2018 1234   K 5.1 03/06/2018 1312   CL 96 (L) 03/06/2018 1312   CO2 29 03/06/2018 1312   BUN 39 (H) 03/06/2018 1312   BUN 23 01/02/2018 1234   CREATININE 1.47 (H) 03/06/2018 1312   CREATININE 1.15 (H) 10/29/2014 1608      Component Value Date/Time   CALCIUM 10.7 (H) 03/06/2018 1312   ALKPHOS 76 03/06/2018 1312   AST 18 03/06/2018 1312   ALT 10 03/06/2018 1312   BILITOT 0.4 03/06/2018 1312       Impression and Plan: Ann Fletcher is an 84 year old white female.  She has iron deficiency anemia.  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  I am glad that she is responding.  We will have to see what her iron levels are today.  We may have to give her some more IV iron.  I would like to try to hold on ESA if possible.  However, we might have to think about this if we cannot get her hemoglobin higher.  We will see her back in another month.  I am just happy that her quality of life is doing  better.   Volanda Napoleon, MD 12/5/20193:57 PM

## 2018-03-07 LAB — IRON AND TIBC
Iron: 52 ug/dL (ref 41–142)
Saturation Ratios: 19 % — ABNORMAL LOW (ref 21–57)
TIBC: 276 ug/dL (ref 236–444)
UIBC: 224 ug/dL (ref 120–384)

## 2018-03-07 LAB — FERRITIN: Ferritin: 443 ng/mL — ABNORMAL HIGH (ref 11–307)

## 2018-03-11 ENCOUNTER — Encounter: Payer: Self-pay | Admitting: Cardiovascular Disease

## 2018-03-11 ENCOUNTER — Ambulatory Visit (INDEPENDENT_AMBULATORY_CARE_PROVIDER_SITE_OTHER): Payer: Medicare Other | Admitting: Cardiovascular Disease

## 2018-03-11 DIAGNOSIS — E7800 Pure hypercholesterolemia, unspecified: Secondary | ICD-10-CM | POA: Diagnosis not present

## 2018-03-11 DIAGNOSIS — I10 Essential (primary) hypertension: Secondary | ICD-10-CM | POA: Diagnosis not present

## 2018-03-11 DIAGNOSIS — I4891 Unspecified atrial fibrillation: Secondary | ICD-10-CM

## 2018-03-11 DIAGNOSIS — I5032 Chronic diastolic (congestive) heart failure: Secondary | ICD-10-CM

## 2018-03-11 NOTE — Assessment & Plan Note (Signed)
History of hyperlipidemia on statin therapy with lipid profile performed 05/07/2017 revealing a total cholesterol of 187, LDL of 88 and HDL of 65.

## 2018-03-11 NOTE — Patient Instructions (Signed)
Medication Instructions:  NONE If you need a refill on your cardiac medications before your next appointment, please call your pharmacy.   Lab work: NONE If you have labs (blood work) drawn today and your tests are completely normal, you will receive your results only by: [name] MyChart Message (if you have MyChart) OR . A paper copy in the mail If you have any lab test that is abnormal or we need to change your treatment, we will call you to review the results.  Testing/Procedures: NONE  Follow-Up: At [facility], you and your health needs are our priority.  As part of our continuing mission to provide you with exceptional heart care, we have created designated Provider Care Teams.  These Care Teams include your primary Cardiologist (physician) and Advanced Practice Providers (APPs -  Physician Assistants and Nurse Practitioners) who all work together to provide you with the care you need, when you need it.  You will need a follow up appointment in 3 months with Ann Deforest, PA and in 12 months with Ann Burow, MD.  Please call our office 2 months in advance to schedule your 12 month follow up appointment.

## 2018-03-11 NOTE — Assessment & Plan Note (Signed)
History of essential hypertension her blood pressure measured today at 138/66.  She is not on antihypertensive medications.

## 2018-03-11 NOTE — Assessment & Plan Note (Signed)
History of atrial fibrillation with RVR previously on amiodarone which resulted in pulmonary fibrosis status post AV node ablation by Dr. Lovena Le 08/23/2017 with pacemaker implantation.  Her A. fib is now controlled.  She was on oral anticoagulation which is been discontinued because of GI bleeding persistent anemia.  She feels clinically improved.

## 2018-03-11 NOTE — Progress Notes (Signed)
03/11/2018 Ann Fletcher   [birth year]  [id number]  Primary Physician [name], MD Primary Cardiologist: Lorretta Harp MD [name], [place]  HPI:  Ann Fletcher is a 84 y.o.  moderately overweight married Caucasian female whose husbandAddisonis also a patient of mine as well and he accompanies her today.. She is a mother of 3 and grandmother and 5 grandchildren. She was referred by Dr. Ashby Dawes and for preoperative clearance before elective back surgery. I last saw her in the office 05/28/2017. Marland KitchenHer cardiac risk factor profile is notable for treated hypertension, diabetes and hyperlipidemia. She does have bilateral looks to me edema. There is no family history of heart disease. She's never had a heart attack or stroke. She denies chest pain or shortness of breath. She apparently has back issues which may require surgery and was referred here for cardiovascular surgical clearance. She was hospitalized at [facility] from July 7 through the 15th with A. Fib with RVR and m edema. She was diuresed, loaded with amiodarone and ultimately cardioverted by Dr. Marlou Porch successfully after 3 shocks which ultimately did not hold. She saw edema and time PA-C in the office 10/29/14 in follow-up. Her heart rate at that time was 100. Today she feels somewhat weak and nauseated. Her EKG shows junctional bradycardia 40. She is on Eliquis . I'mdecreased her amiodarone and referred her to the [name] where she was seen by Roderic Palau nurse practitioner.    She was on amiodarone which was dated discontinued because of pulmonary fibrosis.  She had continued A. fib with RVR and ultimately underwent AV nodal ablation by Dr. Crissie Sickles 08/23/2017 with implantation of a permanent transvenous pacemaker.  Her symptoms improved.  She was on Eliquis which was discontinued because of GI bleed and persistent anemia.  She also has had diastolic heart failure and has been on oral diuretics which  have been adjusted as well as hypokalemia now on a stable dose of potassium repletion.   Current Meds  Medication Sig  . albuterol (PROVENTIL HFA;VENTOLIN HFA) 108 (90 BASE) MCG/ACT inhaler Inhale 2 puffs into the lungs 3 (three) times daily as needed for wheezing or shortness of breath.   [name] atorvastatin (LIPITOR) 40 MG tablet Take 40 mg by mouth at bedtime.   . benzonatate (TESSALON PERLES) 100 MG capsule Take 1 capsule (100 mg total) by mouth 3 (three) times daily as needed for cough.  . Calcium Carbonate-Vitamin D (OSCAL 500/200 D-3 PO) Take 1 tablet by mouth daily.   [name] docusate sodium (COLACE) 100 MG capsule Take 1 capsule (100 mg total) by mouth 2 (two) times daily.  . fluticasone (FLONASE) 50 MCG/ACT nasal spray Place 1 spray into both nostrils daily as needed for allergies.   . furosemide (LASIX) 80 MG tablet Take 1 tablet (80 mg total) by mouth 2 (two) times daily.  [name] levothyroxine (SYNTHROID, LEVOTHROID) 100 MCG tablet Take 100 mcg by mouth daily before breakfast.   . LORazepam (ATIVAN) 1 MG tablet Take 1 mg by mouth 3 (three) times daily as needed for anxiety.   . metFORMIN (GLUCOPHAGE-XR) 500 MG 24 hr tablet Take 1,000 mg by mouth 2 (two) times daily.   . pantoprazole (PROTONIX) 40 MG tablet Take 1 tablet (40 mg total) by mouth daily.  . potassium chloride SA (K-DUR,KLOR-CON) 20 MEQ tablet Take 1 tablet (20 mEq total) by mouth daily.  . traMADol (ULTRAM) 50 MG tablet Take 50 mg by mouth every 8 (eight)  hours as needed for moderate pain.   . vitamin E (VITAMIN E) 400 UNIT capsule Take 400 Units by mouth daily.     Allergies  Allergen Reactions  . Pradaxa [Dabigatran Etexilate Mesylate] Nausea And Vomiting  . Nsaids Other (See Comments) and Itching    Stomach cramps Stomach cramps  . Sulfa Antibiotics Nausea And Vomiting and Other (See Comments)    GI upset  . Betadine [Povidone Iodine] Itching  . Povidone-Iodine Rash    Social History   Socioeconomic History  . Marital  status: Married    Spouse name: Not on file  . Number of children: Not on file  . Years of education: Not on file  . Highest education level: Not on file  Occupational History  . Not on file  Social Needs  . Financial resource strain: Not on file  . Food insecurity:    Worry: Not on file    Inability: Not on file  . Transportation needs:    Medical: Not on file    Non-medical: Not on file  Tobacco Use  . Smoking status: Never Smoker  . Smokeless tobacco: Never Used  Substance and Sexual Activity  . Alcohol use: No  . Drug use: No  . Sexual activity: Yes    Birth control/protection: Surgical  Lifestyle  . Physical activity:    Days per week: Not on file    Minutes per session: Not on file  . Stress: Not on file  Relationships  . Social connections:    Talks on phone: Not on file    Gets together: Not on file    Attends religious service: Not on file    Active member of club or organization: Not on file    Attends meetings of clubs or organizations: Not on file    Relationship status: Not on file  . Intimate partner violence:    Fear of current or ex partner: Not on file    Emotionally abused: Not on file    Physically abused: Not on file    Forced sexual activity: Not on file  Other Topics Concern  . Not on file  Social History Narrative  . Not on file     Review of Systems: General: negative for chills, fever, night sweats or weight changes.  Cardiovascular: negative for chest pain, dyspnea on exertion, edema, orthopnea, palpitations, paroxysmal nocturnal dyspnea or shortness of breath Dermatological: negative for rash Respiratory: negative for cough or wheezing Urologic: negative for hematuria Abdominal: negative for nausea, vomiting, diarrhea, bright red blood per rectum, melena, or hematemesis Neurologic: negative for visual changes, syncope, or dizziness All other systems reviewed and are otherwise negative except as noted above.    Blood pressure  138/66, pulse 79, height 5\' 2"  (1.575 m), weight 138 lb 12.8 oz (63 kg), SpO2 95 %.  General appearance: alert and no distress Neck: no adenopathy, no carotid bruit, no JVD, supple, symmetrical, trachea midline and thyroid not enlarged, symmetric, no tenderness/mass/nodules Lungs: clear to auscultation bilaterally Heart: regular rate and rhythm, S1, S2 normal, no murmur, click, rub or gallop Extremities: extremities normal, atraumatic, no cyanosis or edema Pulses: 2+ and symmetric Skin: Skin color, texture, turgor normal. No rashes or lesions Neurologic: Alert and oriented X 3, normal strength and tone. Normal symmetric reflexes. Normal coordination and gait  EKG not performed today  ASSESSMENT AND PLAN:   Essential hypertension History of essential hypertension her blood pressure measured today at 138/66.  She is not on antihypertensive medications.  Hyperlipidemia History of hyperlipidemia on statin therapy with lipid profile performed 05/07/2017 revealing a total cholesterol of 187, LDL of 88 and HDL of 65.  Atrial fibrillation with RVR (HCC) History of atrial fibrillation with RVR previously on amiodarone which resulted in pulmonary fibrosis status post AV node ablation by Dr. Lovena Le 08/23/2017 with pacemaker implantation.  Her A. fib is now controlled.  She was on oral anticoagulation which is been discontinued because of GI bleeding persistent anemia.  She feels clinically improved.  Chronic diastolic CHF (congestive heart failure) (HCC) History of chronic diastolic heart failure on furosemide with stable weight and no evidence of peripheral edema at this time.      Lorretta Harp MD FACP,FACC,FAHA, [facility] 03/11/2018 2:30 PM

## 2018-03-11 NOTE — Assessment & Plan Note (Signed)
History of chronic diastolic heart failure on furosemide with stable weight and no evidence of peripheral edema at this time.

## 2018-03-14 ENCOUNTER — Ambulatory Visit (INDEPENDENT_AMBULATORY_CARE_PROVIDER_SITE_OTHER): Payer: Medicare Other | Admitting: Podiatry

## 2018-03-14 ENCOUNTER — Encounter: Payer: Self-pay | Admitting: Podiatry

## 2018-03-14 DIAGNOSIS — M79676 Pain in unspecified toe(s): Secondary | ICD-10-CM | POA: Diagnosis not present

## 2018-03-14 DIAGNOSIS — B351 Tinea unguium: Secondary | ICD-10-CM | POA: Diagnosis not present

## 2018-03-14 NOTE — Progress Notes (Signed)
Complaint:  Visit Type: Patient returns to my office for continued preventative foot care services. Complaint: Patient states" my nails have grown long and thick and become painful to walk and wear shoes" Patient has been diagnosed with DM with no foot complications. The patient presents for preventative foot care services. No changes to ROS  Podiatric Exam: Vascular: dorsalis pedis and posterior tibial pulses are palpable bilateral. Capillary return is immediate. Temperature gradient is WNL. Skin turgor WNL  Sensorium: Normal Semmes Weinstein monofilament test. Normal tactile sensation bilaterally. Nail Exam: Pt has thick disfigured discolored nails with subungual debris noted bilateral all nails except hallux toes  B/L Ulcer Exam: There is no evidence of ulcer or pre-ulcerative changes or infection. Orthopedic Exam: Muscle tone and strength are WNL. No limitations in general ROM. No crepitus or effusions noted. HAV  B/L.  Hammer toes second left Skin: No Porokeratosis. No infection or ulcers  Diagnosis:  Onychomycosis, , Pain in right toe, pain in left toes  Treatment & Plan Procedures and Treatment: Consent by patient was obtained for treatment procedures. The patient understood the discussion of treatment and procedures well. All questions were answered thoroughly reviewed. Debridement of mycotic and hypertrophic toenails, 1 through 5 bilateral and clearing of subungual debris. No ulceration, no infection noted.  Return Visit-Office Procedure: Patient instructed to return to the office for a follow up visit 3 months for continued evaluation and treatment.    Gardiner Barefoot DPM

## 2018-04-14 ENCOUNTER — Inpatient Hospital Stay: Payer: Medicare Other

## 2018-04-14 ENCOUNTER — Other Ambulatory Visit: Payer: Self-pay

## 2018-04-14 ENCOUNTER — Inpatient Hospital Stay: Payer: Medicare Other | Attending: Hematology & Oncology | Admitting: Hematology & Oncology

## 2018-04-14 ENCOUNTER — Encounter (INDEPENDENT_AMBULATORY_CARE_PROVIDER_SITE_OTHER): Payer: Self-pay

## 2018-04-14 ENCOUNTER — Encounter: Payer: Self-pay | Admitting: Hematology & Oncology

## 2018-04-14 VITALS — BP 166/56 | HR 77 | Temp 97.1°F | Resp 18 | Wt 136.0 lb

## 2018-04-14 DIAGNOSIS — Z7984 Long term (current) use of oral hypoglycemic drugs: Secondary | ICD-10-CM

## 2018-04-14 DIAGNOSIS — Z79899 Other long term (current) drug therapy: Secondary | ICD-10-CM | POA: Diagnosis not present

## 2018-04-14 DIAGNOSIS — D500 Iron deficiency anemia secondary to blood loss (chronic): Secondary | ICD-10-CM

## 2018-04-14 DIAGNOSIS — K909 Intestinal malabsorption, unspecified: Secondary | ICD-10-CM | POA: Insufficient documentation

## 2018-04-14 LAB — CBC WITH DIFFERENTIAL (CANCER CENTER ONLY)
Abs Immature Granulocytes: 0.03 10*3/uL (ref 0.00–0.07)
Basophils Absolute: 0.1 10*3/uL (ref 0.0–0.1)
Basophils Relative: 1 %
Eosinophils Absolute: 0.1 10*3/uL (ref 0.0–0.5)
Eosinophils Relative: 1 %
HCT: 34.5 % — ABNORMAL LOW (ref 36.0–46.0)
Hemoglobin: 11 g/dL — ABNORMAL LOW (ref 12.0–15.0)
Immature Granulocytes: 0 %
Lymphocytes Relative: 25 %
Lymphs Abs: 2.2 10*3/uL (ref 0.7–4.0)
MCH: 27.2 pg (ref 26.0–34.0)
MCHC: 31.9 g/dL (ref 30.0–36.0)
MCV: 85.4 fL (ref 80.0–100.0)
Monocytes Absolute: 1 10*3/uL (ref 0.1–1.0)
Monocytes Relative: 11 %
Neutro Abs: 5.5 10*3/uL (ref 1.7–7.7)
Neutrophils Relative %: 62 %
Platelet Count: 196 10*3/uL (ref 150–400)
RBC: 4.04 MIL/uL (ref 3.87–5.11)
RDW: 16.5 % — ABNORMAL HIGH (ref 11.5–15.5)
WBC Count: 8.8 10*3/uL (ref 4.0–10.5)
nRBC: 0 % (ref 0.0–0.2)

## 2018-04-14 LAB — CMP (CANCER CENTER ONLY)
ALT: 13 U/L (ref 0–44)
AST: 22 U/L (ref 15–41)
Albumin: 4.3 g/dL (ref 3.5–5.0)
Alkaline Phosphatase: 62 U/L (ref 38–126)
Anion gap: 11 (ref 5–15)
BUN: 58 mg/dL — ABNORMAL HIGH (ref 8–23)
CO2: 31 mmol/L (ref 22–32)
Calcium: 10.3 mg/dL (ref 8.9–10.3)
Chloride: 97 mmol/L — ABNORMAL LOW (ref 98–111)
Creatinine: 1.9 mg/dL — ABNORMAL HIGH (ref 0.44–1.00)
GFR, Est AFR Am: 28 mL/min — ABNORMAL LOW (ref >60)
GFR, Estimated: 24 mL/min — ABNORMAL LOW (ref >60)
Glucose, Bld: 127 mg/dL — ABNORMAL HIGH (ref 70–99)
Potassium: 4.8 mmol/L (ref 3.5–5.1)
Sodium: 139 mmol/L (ref 135–145)
Total Bilirubin: 0.6 mg/dL (ref 0.3–1.2)
Total Protein: 8.1 g/dL (ref 6.5–8.1)

## 2018-04-14 LAB — RETICULOCYTES
Immature Retic Fract: 7.9 % (ref 2.3–15.9)
RBC.: 4.04 MIL/uL (ref 3.87–5.11)
Retic Count, Absolute: 30.7 10*3/uL (ref 19.0–186.0)
Retic Ct Pct: 0.8 % (ref 0.4–3.1)

## 2018-04-14 NOTE — Progress Notes (Signed)
Hematology and Oncology Follow Up Visit  Ann Fletcher [id number] [birth year] 85 y.o. 04/14/2018   Principle Diagnosis:   Iron deficiency anemia --iron malabsorption and chronic blood loss  Current Therapy:    IV iron as indicated -- last dose of Feraheme on 02/07/2018     Interim History:  Ann Fletcher is back for follow-up.  Her hemoglobin continues to improve.  We gave her iron back in November.  At that time, her ferritin was 49 with an iron saturation of 9 %.  When we rechecked her iron in December, her ferritin was 443 with an iron saturation of 19%.  She had a wonderful Thanksgiving and Christmas.  She comes in with her brother.  She has had no problems with bleeding.  There is been no change in bowel or bladder habits.  She has had no cough.  There is been no rashes.  She is had no leg swelling.  Overall, her performance status is ECOG 2.  Medications:  Current Outpatient Medications:  .  albuterol (PROVENTIL HFA;VENTOLIN HFA) 108 (90 BASE) MCG/ACT inhaler, Inhale 2 puffs into the lungs 3 (three) times daily as needed for wheezing or shortness of breath. , Disp: , Rfl:  .  atorvastatin (LIPITOR) 40 MG tablet, Take 40 mg by mouth at bedtime. , Disp: , Rfl:  .  benzonatate (TESSALON PERLES) 100 MG capsule, Take 1 capsule (100 mg total) by mouth 3 (three) times daily as needed for cough., Disp: 60 capsule, Rfl: 0 .  Calcium Carbonate-Vitamin D (OSCAL 500/200 D-3 PO), Take 1 tablet by mouth daily. , Disp: , Rfl:  .  docusate sodium (COLACE) 100 MG capsule, Take 1 capsule (100 mg total) by mouth 2 (two) times daily., Disp: 60 capsule, Rfl: 3 .  fluticasone (FLONASE) 50 MCG/ACT nasal spray, Place 1 spray into both nostrils daily as needed for allergies. , Disp: , Rfl:  .  furosemide (LASIX) 80 MG tablet, Take 1 tablet (80 mg total) by mouth 2 (two) times daily., Disp: 60 tablet, Rfl: 6 .  levothyroxine (SYNTHROID, LEVOTHROID) 100 MCG tablet, Take 100 mcg by mouth daily before  breakfast. , Disp: , Rfl:  .  LORazepam (ATIVAN) 1 MG tablet, Take 1 mg by mouth 3 (three) times daily as needed for anxiety. , Disp: , Rfl:  .  metFORMIN (GLUCOPHAGE-XR) 500 MG 24 hr tablet, Take 1,000 mg by mouth 2 (two) times daily. , Disp: , Rfl:  .  pantoprazole (PROTONIX) 40 MG tablet, Take 1 tablet (40 mg total) by mouth daily., Disp: 30 tablet, Rfl: 3 .  potassium chloride SA (K-DUR,KLOR-CON) 20 MEQ tablet, Take 1 tablet (20 mEq total) by mouth daily., Disp: 90 tablet, Rfl: 3 .  traMADol (ULTRAM) 50 MG tablet, Take 50 mg by mouth every 8 (eight) hours as needed for moderate pain. , Disp: , Rfl:  .  vitamin E (VITAMIN E) 400 UNIT capsule, Take 400 Units by mouth daily., Disp: , Rfl:   Allergies:  Allergies  Allergen Reactions  . Pradaxa [Dabigatran Etexilate Mesylate] Nausea And Vomiting  . Nsaids Other (See Comments) and Itching    Stomach cramps Stomach cramps  . Sulfa Antibiotics Nausea And Vomiting and Other (See Comments)    GI upset  . Betadine [Povidone Iodine] Itching  . Povidone-Iodine Rash    Past Medical History, Surgical history, Social history, and Family History were reviewed and updated.  Review of Systems: Review of Systems  Constitutional: Negative.   HENT:  Negative.  Eyes: Negative.   Respiratory: Negative.   Cardiovascular: Negative.   Gastrointestinal: Negative.   Endocrine: Negative.   Genitourinary: Negative.    Musculoskeletal: Negative.   Skin: Negative.   Neurological: Negative.   Hematological: Negative.   Psychiatric/Behavioral: Negative.     Physical Exam:  weight is 136 lb (61.7 kg). Her oral temperature is 97.1 F (36.2 C) (abnormal). Her blood pressure is 166/56 (abnormal) and her pulse is 77. Her respiration is 18 and oxygen saturation is 99%.   Wt Readings from Last 3 Encounters:  04/14/18 136 lb (61.7 kg)  03/11/18 138 lb 12.8 oz (63 kg)  03/06/18 140 lb 6.4 oz (63.7 kg)    Physical Exam Vitals signs reviewed.  HENT:      Head: Normocephalic and atraumatic.  Eyes:     Pupils: Pupils are equal, round, and reactive to light.  Neck:     Musculoskeletal: Normal range of motion.  Cardiovascular:     Rate and Rhythm: Normal rate and regular rhythm.     Heart sounds: Normal heart sounds.  Pulmonary:     Effort: Pulmonary effort is normal.     Breath sounds: Normal breath sounds.  Abdominal:     General: Bowel sounds are normal.     Palpations: Abdomen is soft.  Musculoskeletal: Normal range of motion.        General: No tenderness or deformity.  Lymphadenopathy:     Cervical: No cervical adenopathy.  Skin:    General: Skin is warm and dry.     Findings: No erythema or rash.  Neurological:     Mental Status: She is alert and oriented to person, place, and time.  Psychiatric:        Behavior: Behavior normal.        Thought Content: Thought content normal.        Judgment: Judgment normal.      Lab Results  Component Value Date   WBC 8.8 04/14/2018   HGB 11.0 (L) 04/14/2018   HCT 34.5 (L) 04/14/2018   MCV 85.4 04/14/2018   PLT 196 04/14/2018     Chemistry      Component Value Date/Time   NA 133 (L) 03/06/2018 1312   NA 138 01/02/2018 1234   K 5.1 03/06/2018 1312   CL 96 (L) 03/06/2018 1312   CO2 29 03/06/2018 1312   BUN 39 (H) 03/06/2018 1312   BUN 23 01/02/2018 1234   CREATININE 1.47 (H) 03/06/2018 1312   CREATININE 1.15 (H) 10/29/2014 1608      Component Value Date/Time   CALCIUM 10.7 (H) 03/06/2018 1312   ALKPHOS 76 03/06/2018 1312   AST 18 03/06/2018 1312   ALT 10 03/06/2018 1312   BILITOT 0.4 03/06/2018 1312       Impression and Plan: Ann Fletcher is an 85 year old white female.  She has iron deficiency anemia.  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  It is apparent that she is responding quite nicely.  Her hemoglobin continues to improve.  I would be very interested to see what the iron studies show today.  At this point, we will we now get  her back in 2 months.  I want to get her through the winter months just in case there is any bad weather.     Volanda Napoleon, MD 1/13/20201:18 PM

## 2018-04-15 LAB — IRON AND TIBC
Iron: 68 ug/dL (ref 41–142)
Saturation Ratios: 25 % (ref 21–57)
TIBC: 278 ug/dL (ref 236–444)
UIBC: 209 ug/dL (ref 120–384)

## 2018-04-15 LAB — FERRITIN: Ferritin: 349 ng/mL — ABNORMAL HIGH (ref 11–307)

## 2018-04-18 LAB — CUP PACEART REMOTE DEVICE CHECK
Battery Remaining Longevity: 133 mo
Battery Voltage: 3.11 V
Brady Statistic AP VP Percent: 0 %
Brady Statistic AP VS Percent: 0 %
Brady Statistic AS VP Percent: 99.99 %
Brady Statistic AS VS Percent: 0.01 %
Brady Statistic RA Percent Paced: 100 %
Brady Statistic RV Percent Paced: 99.99 %
Date Time Interrogation Session: 20191125113743
Implantable Lead Implant Date: 20190524
Implantable Lead Implant Date: 20190524
Implantable Lead Location: 753859
Implantable Lead Location: 753860
Implantable Lead Model: 3830
Implantable Lead Model: 5076
Implantable Pulse Generator Implant Date: 20190524
Lead Channel Impedance Value: 323 Ohm
Lead Channel Impedance Value: 361 Ohm
Lead Channel Impedance Value: 475 Ohm
Lead Channel Impedance Value: 494 Ohm
Lead Channel Pacing Threshold Amplitude: 0.75 V
Lead Channel Pacing Threshold Pulse Width: 0.4 ms
Lead Channel Sensing Intrinsic Amplitude: 1.5 mV
Lead Channel Sensing Intrinsic Amplitude: 5.25 mV
Lead Channel Sensing Intrinsic Amplitude: 5.25 mV
Lead Channel Setting Pacing Amplitude: 2.5 V
Lead Channel Setting Pacing Pulse Width: 0.4 ms
Lead Channel Setting Sensing Sensitivity: 4 mV

## 2018-04-29 DIAGNOSIS — M549 Dorsalgia, unspecified: Secondary | ICD-10-CM | POA: Diagnosis not present

## 2018-05-16 DIAGNOSIS — I10 Essential (primary) hypertension: Secondary | ICD-10-CM | POA: Diagnosis not present

## 2018-05-16 DIAGNOSIS — E1165 Type 2 diabetes mellitus with hyperglycemia: Secondary | ICD-10-CM | POA: Diagnosis not present

## 2018-05-16 DIAGNOSIS — E1121 Type 2 diabetes mellitus with diabetic nephropathy: Secondary | ICD-10-CM | POA: Diagnosis not present

## 2018-05-16 DIAGNOSIS — N390 Urinary tract infection, site not specified: Secondary | ICD-10-CM | POA: Diagnosis not present

## 2018-05-26 ENCOUNTER — Ambulatory Visit (INDEPENDENT_AMBULATORY_CARE_PROVIDER_SITE_OTHER): Payer: Medicare Other | Admitting: *Deleted

## 2018-05-26 DIAGNOSIS — Z0000 Encounter for general adult medical examination without abnormal findings: Secondary | ICD-10-CM | POA: Diagnosis not present

## 2018-05-26 DIAGNOSIS — I442 Atrioventricular block, complete: Secondary | ICD-10-CM | POA: Diagnosis not present

## 2018-05-26 DIAGNOSIS — Z23 Encounter for immunization: Secondary | ICD-10-CM | POA: Diagnosis not present

## 2018-05-26 DIAGNOSIS — E1165 Type 2 diabetes mellitus with hyperglycemia: Secondary | ICD-10-CM | POA: Diagnosis not present

## 2018-05-27 ENCOUNTER — Other Ambulatory Visit: Payer: Self-pay

## 2018-05-27 LAB — CUP PACEART REMOTE DEVICE CHECK
Battery Remaining Longevity: 131 mo
Battery Voltage: 3.06 V
Brady Statistic AP VP Percent: 0 %
Brady Statistic AP VS Percent: 0 %
Brady Statistic AS VP Percent: 99.98 %
Brady Statistic AS VS Percent: 0.02 %
Brady Statistic RA Percent Paced: 0 %
Brady Statistic RV Percent Paced: 99.98 %
Date Time Interrogation Session: 20200224113508
Implantable Lead Implant Date: 20190524
Implantable Lead Implant Date: 20190524
Implantable Lead Location: 753859
Implantable Lead Location: 753860
Implantable Lead Model: 3830
Implantable Lead Model: 5076
Implantable Pulse Generator Implant Date: 20190524
Lead Channel Impedance Value: 361 Ohm
Lead Channel Impedance Value: 380 Ohm
Lead Channel Impedance Value: 475 Ohm
Lead Channel Impedance Value: 494 Ohm
Lead Channel Pacing Threshold Amplitude: 0.75 V
Lead Channel Pacing Threshold Pulse Width: 0.4 ms
Lead Channel Sensing Intrinsic Amplitude: 1.5 mV
Lead Channel Sensing Intrinsic Amplitude: 5.25 mV
Lead Channel Sensing Intrinsic Amplitude: 5.25 mV
Lead Channel Setting Pacing Amplitude: 2.5 V
Lead Channel Setting Pacing Pulse Width: 0.4 ms
Lead Channel Setting Sensing Sensitivity: 4 mV

## 2018-05-27 NOTE — Patient Outreach (Signed)
[facility]) Care Management  05/27/2018  Ann Fletcher 1933-02-27 [id number]   Medication Adherence call to Ann Fletcher [facility] Identifiers Verify spoke with patient she has been taking Metformin Er 500 mg 1 tablet 2 times a day not what's on the prescription 2 tablets 2 times a day, patient explain doctor said to take it this way patient has medication at this time left a message at doctors office they will call back. Ann Fletcher is showing past due under [facility].   [place] Management Direct Dial [phone]  Fax [phone] [email or web address]

## 2018-05-28 ENCOUNTER — Other Ambulatory Visit: Payer: Self-pay

## 2018-05-28 NOTE — Patient Outreach (Signed)
[place] [facility]) Care Management  05/28/2018  ANGALINA ANTE 1932/08/07 [id number]   Medication Adherence for [name] call back from doctor's office. Izora Gala from [place] office call she will call in a new prescription stating new instructions on Metformin ER 500 mg she is only taking 1 tablet 2 times a day instead of 2 tablets 2 times a day.   [place] Management Direct Dial [phone]  Fax [phone] [email or web address]

## 2018-06-02 ENCOUNTER — Encounter: Payer: Self-pay | Admitting: Physician Assistant

## 2018-06-02 ENCOUNTER — Ambulatory Visit (INDEPENDENT_AMBULATORY_CARE_PROVIDER_SITE_OTHER): Payer: Medicare Other | Admitting: Physician Assistant

## 2018-06-02 VITALS — BP 160/70 | HR 74 | Ht 62.0 in | Wt 138.2 lb

## 2018-06-02 DIAGNOSIS — Z950 Presence of cardiac pacemaker: Secondary | ICD-10-CM

## 2018-06-02 DIAGNOSIS — I10 Essential (primary) hypertension: Secondary | ICD-10-CM

## 2018-06-02 DIAGNOSIS — N189 Chronic kidney disease, unspecified: Secondary | ICD-10-CM

## 2018-06-02 DIAGNOSIS — I482 Chronic atrial fibrillation, unspecified: Secondary | ICD-10-CM | POA: Diagnosis not present

## 2018-06-02 DIAGNOSIS — N179 Acute kidney failure, unspecified: Secondary | ICD-10-CM | POA: Diagnosis not present

## 2018-06-02 DIAGNOSIS — I5032 Chronic diastolic (congestive) heart failure: Secondary | ICD-10-CM

## 2018-06-02 DIAGNOSIS — Z79899 Other long term (current) drug therapy: Secondary | ICD-10-CM | POA: Diagnosis not present

## 2018-06-02 DIAGNOSIS — E119 Type 2 diabetes mellitus without complications: Secondary | ICD-10-CM

## 2018-06-02 MED ORDER — FUROSEMIDE 40 MG PO TABS: ORAL_TABLET | ORAL | 12 refills | Status: DC

## 2018-06-02 NOTE — Patient Instructions (Signed)
Medication Instructions:  DECREASE LASIX 80MG  IN THE AM AND 40MG  IN THE PM-AT LEAST 6 HOURS APART If you need a refill on your cardiac medications before your next appointment, please call your pharmacy.  Labwork: BMET IN 1 WEEK HERE IN OUR OFFICE AT [facility]    You will NOT need to fast   Take the provided lab slips with you to the lab for your blood draw.   When you have your labs (blood work) drawn today and your tests are completely normal, you will receive your results only by MyChart Message (if you have MyChart) -OR-  A paper copy in the mail.  If you have any lab test that is abnormal or we need to change your treatment, we will call you to review these results.  Follow-Up: You will need a follow up appointment in 9 months.  Please call our office 2 months, SEPT 2020, in advance to schedule this, NOV 2020, appointment.  You may see Quay Burow, MD or one of the following Advanced Practice Providers on your designated Care Team:  Kerin Ransom, PA-C  Roby Lofts, PA-C  Sande Rives, [place]     At [facility], you and your health needs are our priority.  As part of our continuing mission to provide you with exceptional heart care, we have created designated Provider Care Teams.  These Care Teams include your primary Cardiologist (physician) and Advanced Practice Providers (APPs -  Physician Assistants and Nurse Practitioners) who all work together to provide you with the care you need, when you need it.  Thank you for choosing [facility] at [facility]!!

## 2018-06-02 NOTE — Progress Notes (Signed)
Cardiology Office Note    Date:  06/04/2018   ID:  Ann Fletcher, DOB Oct 03, 1932, MRN [id number]  PCP:  [name], MD  Cardiologist:  [name] Electrophysiologist: Dr. Lovena Le  Chief Complaint  Patient presents with  . Follow-up    seen for [name]    History of Present Illness:  Ann Fletcher is a 85 y.o. female with PMH of HTN, DM, COPD/ILD andatrial fibrillation s/p AVN ablation and PPM 08/23/2017. She was on amiodarone but developed pulmonary fibrosis. After she was taken off of amiodarone, she went back into atrial fibrillation with uncontrolled ventricular rate. She ultimately had AV nodal ablation and pacemaker placement in May 2019. She had CHF and significant anemia in August 2019. She was diuresed from 72 kg down to 69.6 kg. GI work-up did not show obvious sign of bleeding. The original plan is to consider resuming Eliquis when her hemoglobin stabilized. She insisted to be discharged on 11/07/2017.  Follow-up hemoglobin continue to show significant anemia.  I last saw the patient in October 2019, I kept her on the 80 mg twice daily of Lasix.  Since the last office visit, patient has been seen by [name] in December 2019 and she was doing well at the time.  She has been followed by Dr. Marin Olp of hematology oncology service, with iron therapy, her anemia finally improved.  Last hemoglobin in January was 11.  Creatinine however trended up to 1.9 on the last lab work.  Patient presents today for cardiology office visit.  She has been doing quite well recently.  She denies any chest pain or shortness of breath.  She appears to be euvolemic on physical exam.  Even though her lab work in December showed stable renal function, however by late January, it did reveal acute on chronic renal insufficiency.  I will decrease her Lasix to 80 mg a.m. and 40 mg p.m.  She will need 1 week basic metabolic panel.  I did instruct the patient to continue to weigh herself and call us if  her weight increased by more than 3 pounds overnight or 5 pounds in single week.   Past Medical History:  Diagnosis Date  . Anxiety   . Arthritis   . AV node arrhythmia    a. s/p AV node ablation/PPM 07/2017.  [name] Chronic diastolic CHF (congestive heart failure) ([place])    a. Acute exacerbation occurred in setting of AF.  [name] CKD (chronic kidney disease), stage III ([place])   . Diabetes mellitus without complication ([place])    type 2  . H/O bladder infections   . Hyperlipidemia   . Hypertension   . Hypothyroidism   . Lower extremity edema   . Osteoporosis   . Persistent atrial fibrillation    a. diagnosed in 10/2014 by PCP, underwent failed TEE DCCV on 10/08/14, loaded with amio, successfully DCCV on 10/15/14.    Past Surgical History:  Procedure Laterality Date  . AV NODE ABLATION N/A 08/23/2017   Procedure: AV NODE ABLATION;  Surgeon: [name], MD;  Location: [place] [facility];  Service: Cardiovascular;  Laterality: N/A;  . BACK SURGERY  2001, 2003, 2006   x 3  . BIOPSY  11/06/2017   Procedure: BIOPSY;  Surgeon: [name], MD;  Location: [place];  Service: Endoscopy;;  . CARDIOVERSION N/A 10/08/2014   Procedure: CARDIOVERSION;  Surgeon: [name], MD;  Location: [place];  Service: Cardiovascular;  Laterality: N/A;  . CARDIOVERSION N/A 10/15/2014  Procedure: CARDIOVERSION;  Surgeon: [name], MD;  Location[facility] ENDOSCOPY;  Service: Cardiovascular;  Laterality: N/A;  . CARDIOVERSION N/A 11/15/2014   Procedure: CARDIOVERSION;  Surgeon: [name], MD;  Location: [place];  Service: Cardiovascular;  Laterality: N/A;  . COLONOSCOPY N/A 11/06/2017   Procedure: COLONOSCOPY;  Surgeon: [name], MD;  Location: [facility] ENDOSCOPY;  Service: Endoscopy;  Laterality: N/A;  . ESOPHAGOGASTRODUODENOSCOPY N/A 11/06/2017   Procedure: ESOPHAGOGASTRODUODENOSCOPY (EGD);  Surgeon: [name], MD;  Location: [facility] ENDOSCOPY;  Service: Endoscopy;  Laterality: N/A;  . PACEMAKER  IMPLANT N/A 08/23/2017   Procedure: PACEMAKER IMPLANT;  Surgeon: [name], MD;  Location: [place] [facility];  Service: Cardiovascular;  Laterality: N/A;  . POLYPECTOMY  11/06/2017   Procedure: POLYPECTOMY;  Surgeon: [name], MD;  Location: [facility] ENDOSCOPY;  Service: Endoscopy;;  . 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 TEE WITHOUT CARDIOVERSION N/A 10/08/2014   Procedure: TRANSESOPHAGEAL ECHOCARDIOGRAM (TEE);  Surgeon: [name], MD;  Location: [place];  Service: Cardiovascular;  Laterality: N/A;  . TUBAL LIGATION      Current Medications: Outpatient Medications Prior to Visit  Medication Sig Dispense Refill  . albuterol (PROVENTIL HFA;VENTOLIN HFA) 108 (90 BASE) MCG/ACT inhaler Inhale 2 puffs into the lungs 3 (three) times daily as needed for wheezing or shortness of breath.     [name] apixaban (ELIQUIS) 5 MG TABS tablet Take 5 mg by mouth daily.    [name] atorvastatin (LIPITOR) 40 MG tablet Take 40 mg by mouth at bedtime.     . benzonatate (TESSALON PERLES) 100 MG capsule Take 1 capsule (100 mg total) by mouth 3 (three) times daily as needed for cough. 60 capsule 0  . Calcium Carbonate-Vitamin D (OSCAL 500/200 D-3 PO) Take 1 tablet by mouth daily.     [name] docusate sodium (COLACE) 100 MG capsule Take 1 capsule (100 mg total) by mouth 2 (two) times daily. 60 capsule 3  . fluticasone (FLONASE) 50 MCG/ACT nasal spray Place 1 spray into both nostrils daily as needed for allergies.     [name] levothyroxine (SYNTHROID, LEVOTHROID) 100 MCG tablet Take 100 mcg by mouth daily before breakfast.     . LORazepam (ATIVAN) 1 MG tablet Take 1 mg by mouth 3 (three) times daily as needed for anxiety.     . metFORMIN (GLUCOPHAGE-XR) 500 MG 24 hr tablet Take 1,000 mg by mouth 2 (two) times daily.     . pantoprazole (PROTONIX) 40 MG tablet Take 1 tablet (40 mg total) by mouth daily. 30 tablet 3  . potassium chloride SA (K-DUR,KLOR-CON) 20 MEQ tablet Take 1 tablet (20 mEq total) by mouth daily. 90 tablet 3  .  traMADol (ULTRAM) 50 MG tablet Take 50 mg by mouth every 8 (eight) hours as needed for moderate pain.     . vitamin E (VITAMIN E) 400 UNIT capsule Take 400 Units by mouth daily.    . furosemide (LASIX) 80 MG tablet Take 1 tablet (80 mg total) by mouth 2 (two) times daily. 60 tablet 6   No facility-administered medications prior to visit.      Allergies:   Pradaxa [dabigatran etexilate mesylate]; Nsaids; Sulfa antibiotics; Tizanidine hcl; Betadine [povidone iodine]; and Povidone-iodine   Social History   Socioeconomic History  . Marital status: Married    Spouse name: Not on file  . Number of children: Not on file  . Years of education: Not on file  . Highest education level: Not on file  Occupational History  .  Not on file  Social Needs  . Financial resource strain: Not on file  . Food insecurity:    Worry: Not on file    Inability: Not on file  . Transportation needs:    Medical: Not on file    Non-medical: Not on file  Tobacco Use  . Smoking status: Never Smoker  . Smokeless tobacco: Never Used  Substance and Sexual Activity  . Alcohol use: No  . Drug use: No  . Sexual activity: Yes    Birth control/protection: Surgical  Lifestyle  . Physical activity:    Days per week: Not on file    Minutes per session: Not on file  . Stress: Not on file  Relationships  . Social connections:    Talks on phone: Not on file    Gets together: Not on file    Attends religious service: Not on file    Active member of club or organization: Not on file    Attends meetings of clubs or organizations: Not on file    Relationship status: Not on file  Other Topics Concern  . Not on file  Social History Narrative  . Not on file     Family History:  The patient's family history includes Diabetes in her sister; Heart attack in her father; Pneumonia in her mother.   ROS:   Please see the history of present illness.    ROS All other systems reviewed and are negative.   PHYSICAL EXAM:     VS:  BP (!) 160/70   Pulse 74   Ht 5\' 2"  (1.575 m)   Wt 138 lb 3.2 oz (62.7 kg)   BMI 25.28 kg/m    GEN: Well nourished, well developed, in no acute distress  HEENT: normal  Neck: no JVD, carotid bruits, or masses Cardiac: RRR; no murmurs, rubs, or gallops,no edema  Respiratory:  clear to auscultation bilaterally, normal work of breathing GI: soft, nontender, nondistended, + BS MS: no deformity or atrophy  Skin: warm and dry, no rash Neuro:  Alert and Oriented x 3, Strength and sensation are intact Psych: euthymic mood, full affect  Wt Readings from Last 3 Encounters:  06/02/18 138 lb 3.2 oz (62.7 kg)  04/14/18 136 lb (61.7 kg)  03/11/18 138 lb 12.8 oz (63 kg)      Studies/Labs Reviewed:   EKG:  EKG is ordered today.  The ekg ordered today demonstrates NSR, LBBB  Recent Labs: 07/04/2017: TSH 0.553 10/31/2017: B Natriuretic Peptide 582.0 04/14/2018: ALT 13; BUN 58; Creatinine 1.90; Hemoglobin 11.0; Platelet Count 196; Potassium 4.8; Sodium 139   Lipid Panel No results found for: CHOL, TRIG, HDL, CHOLHDL, VLDL, LDLCALC, LDLDIRECT  Additional studies/ records that were reviewed today include:    Echo 11/01/2017 LV EF: 60% -   65%  ------------------------------------------------------------------- History:   PMH:  CHF 428.21.  Atrial fibrillation.  Congestive heart failure.  Risk factors:  AV Node Ablation. CKD. Hypertension. Diabetes mellitus. Dyslipidemia.  ------------------------------------------------------------------- Study Conclusions  - Left ventricle: The cavity size was normal. There was severe   concentric hypertrophy. Systolic function was normal. The   estimated ejection fraction was in the range of 60% to 65%. Wall   motion was normal; there were no regional wall motion   abnormalities. The study is not technically sufficient to allow   evaluation of LV diastolic function. - Aortic valve: Trileaflet; mildly thickened, mildly calcified   leaflets. -  Mitral valve: There was mild regurgitation. - Right ventricle: The  cavity size was moderately dilated. Wall   thickness was normal. The moderator band was prominent. Pacer   wire or catheter noted in right ventricle. Systolic function was   mildly to moderately reduced. - Right atrium: The atrium was moderately dilated. Pacer wire or   catheter noted in right atrium. - Tricuspid valve: There was moderate regurgitation. - Pericardium, extracardiac: A trivial, free-flowing pericardial   effusion was identified circumferential to the heart. The fluid   had no internal echoes.There was no evidence of hemodynamic   compromise.  ASSESSMENT:    1. Chronic diastolic heart failure ([place])   2. Acute renal failure superimposed on chronic kidney disease, unspecified CKD stage, unspecified acute renal failure type ([place])   3. Chronic atrial fibrillation   4. Medication management   5. Essential hypertension   6. Controlled type 2 diabetes mellitus without complication, without long-term current use of insulin ([place])   7. Pacemaker      PLAN:  In order of problems listed above:  1. Chronic diastolic heart failure: Euvolemic on physical exam.  Renal function worsening on the last lab work, will decrease Lasix to 80 mg a.m. and 40 mg p.m. and reassess basic metabolic panel in 1 week  2. Chronic atrial fibrillation status post AV nodal ablation and pacemaker placement: Continue Eliquis 5 mg twice daily.  3. Acute on chronic renal insufficiency: Creatinine trended up to 1.9 in January.  We will repeat lab work in 1 week after decreasing diuretic dosage  4. Hypertension: Blood pressure elevated.  Her blood pressure was normal in December, I will hold off on adjusting blood pressure medication for the time being.  5. DM2: Managed by primary care provider  6. History of pacemaker: Followed by Dr. Lovena Le    Medication Adjustments/Labs and Tests Ordered: Current medicines are reviewed at length with  the patient today.  Concerns regarding medicines are outlined above.  Medication changes, Labs and Tests ordered today are listed in the Patient Instructions below. Patient Instructions  Medication Instructions:  DECREASE LASIX 80MG  IN THE AM AND 40MG  IN THE PM-AT LEAST 6 HOURS APART If you need a refill on your cardiac medications before your next appointment, please call your pharmacy.  Labwork: BMET IN 1 WEEK HERE IN OUR OFFICE AT [facility]    You will NOT need to fast   Take the provided lab slips with you to the lab for your blood draw.   When you have your labs (blood work) drawn today and your tests are completely normal, you will receive your results only by MyChart Message (if you have MyChart) -OR-  A paper copy in the mail.  If you have any lab test that is abnormal or we need to change your treatment, we will call you to review these results.  Follow-Up: You will need a follow up appointment in 9 months.  Please call our office 2 months, SEPT 2020, in advance to schedule this, NOV 2020, appointment.  You may see Quay Burow, MD or one of the following Advanced Practice Providers on your designated Care Team:  Kerin Ransom, PA-C  Roby Lofts, PA-C  Sande Rives, [place]     At [facility], you and your health needs are our priority.  As part of our continuing mission to provide you with exceptional heart care, we have created designated Provider Care Teams.  These Care Teams include your primary Cardiologist (physician) and Advanced Practice Providers (APPs -  Physician Assistants and Nurse Practitioners) who all  work together to provide you with the care you need, when you need it.  Thank you for choosing [facility] at [facility], Almyra Deforest, [place]  06/04/2018 11:45 PM    [address], [place], [place]  [address] Phone: [phone]; Fax: [phone]

## 2018-06-03 ENCOUNTER — Encounter: Payer: Self-pay | Admitting: Cardiology

## 2018-06-03 NOTE — Progress Notes (Signed)
Remote pacemaker transmission.   

## 2018-06-04 ENCOUNTER — Encounter: Payer: Self-pay | Admitting: Physician Assistant

## 2018-06-09 DIAGNOSIS — Z79899 Other long term (current) drug therapy: Secondary | ICD-10-CM | POA: Diagnosis not present

## 2018-06-09 DIAGNOSIS — I4891 Unspecified atrial fibrillation: Secondary | ICD-10-CM | POA: Diagnosis not present

## 2018-06-10 ENCOUNTER — Inpatient Hospital Stay: Payer: Medicare Other | Attending: Hematology & Oncology | Admitting: Hematology & Oncology

## 2018-06-10 ENCOUNTER — Other Ambulatory Visit: Payer: Self-pay

## 2018-06-10 ENCOUNTER — Inpatient Hospital Stay: Payer: Medicare Other

## 2018-06-10 ENCOUNTER — Telehealth: Payer: Self-pay

## 2018-06-10 VITALS — BP 161/53 | HR 74 | Temp 98.1°F | Resp 17 | Wt 137.0 lb

## 2018-06-10 DIAGNOSIS — Z7901 Long term (current) use of anticoagulants: Secondary | ICD-10-CM | POA: Insufficient documentation

## 2018-06-10 DIAGNOSIS — Z79899 Other long term (current) drug therapy: Secondary | ICD-10-CM | POA: Diagnosis not present

## 2018-06-10 DIAGNOSIS — R55 Syncope and collapse: Secondary | ICD-10-CM | POA: Diagnosis not present

## 2018-06-10 DIAGNOSIS — D500 Iron deficiency anemia secondary to blood loss (chronic): Secondary | ICD-10-CM

## 2018-06-10 DIAGNOSIS — K909 Intestinal malabsorption, unspecified: Secondary | ICD-10-CM | POA: Diagnosis not present

## 2018-06-10 DIAGNOSIS — M542 Cervicalgia: Secondary | ICD-10-CM | POA: Diagnosis not present

## 2018-06-10 DIAGNOSIS — I4891 Unspecified atrial fibrillation: Secondary | ICD-10-CM

## 2018-06-10 LAB — CBC WITH DIFFERENTIAL (CANCER CENTER ONLY)
Abs Immature Granulocytes: 0.04 10*3/uL (ref 0.00–0.07)
Basophils Absolute: 0.1 10*3/uL (ref 0.0–0.1)
Basophils Relative: 1 %
Eosinophils Absolute: 0.1 10*3/uL (ref 0.0–0.5)
Eosinophils Relative: 1 %
HCT: 34 % — ABNORMAL LOW (ref 36.0–46.0)
Hemoglobin: 10.6 g/dL — ABNORMAL LOW (ref 12.0–15.0)
Immature Granulocytes: 0 %
Lymphocytes Relative: 18 %
Lymphs Abs: 1.9 10*3/uL (ref 0.7–4.0)
MCH: 26.8 pg (ref 26.0–34.0)
MCHC: 31.2 g/dL (ref 30.0–36.0)
MCV: 85.9 fL (ref 80.0–100.0)
Monocytes Absolute: 1.2 10*3/uL — ABNORMAL HIGH (ref 0.1–1.0)
Monocytes Relative: 11 %
Neutro Abs: 7.4 10*3/uL (ref 1.7–7.7)
Neutrophils Relative %: 69 %
Platelet Count: 215 10*3/uL (ref 150–400)
RBC: 3.96 MIL/uL (ref 3.87–5.11)
RDW: 13.1 % (ref 11.5–15.5)
WBC Count: 10.6 10*3/uL — ABNORMAL HIGH (ref 4.0–10.5)
nRBC: 0 % (ref 0.0–0.2)

## 2018-06-10 LAB — CMP (CANCER CENTER ONLY)
ALT: 10 U/L (ref 0–44)
AST: 16 U/L (ref 15–41)
Albumin: 4.4 g/dL (ref 3.5–5.0)
Alkaline Phosphatase: 81 U/L (ref 38–126)
Anion gap: 12 (ref 5–15)
BUN: 50 mg/dL — ABNORMAL HIGH (ref 8–23)
CO2: 29 mmol/L (ref 22–32)
Calcium: 10.2 mg/dL (ref 8.9–10.3)
Chloride: 96 mmol/L — ABNORMAL LOW (ref 98–111)
Creatinine: 1.6 mg/dL — ABNORMAL HIGH (ref 0.44–1.00)
GFR, Est AFR Am: 34 mL/min — ABNORMAL LOW (ref >60)
GFR, Estimated: 29 mL/min — ABNORMAL LOW (ref >60)
Glucose, Bld: 320 mg/dL — ABNORMAL HIGH (ref 70–99)
Potassium: 4.5 mmol/L (ref 3.5–5.1)
Sodium: 137 mmol/L (ref 135–145)
Total Bilirubin: 0.7 mg/dL (ref 0.3–1.2)
Total Protein: 8.1 g/dL (ref 6.5–8.1)

## 2018-06-10 LAB — BASIC METABOLIC PANEL
BUN/Creatinine Ratio: 23 (ref 12–28)
BUN: 37 mg/dL — ABNORMAL HIGH (ref 8–27)
CO2: 24 mmol/L (ref 20–29)
Calcium: 10.1 mg/dL (ref 8.7–10.3)
Chloride: 93 mmol/L — ABNORMAL LOW (ref 96–106)
Creatinine, Ser: 1.61 mg/dL — ABNORMAL HIGH (ref 0.57–1.00)
GFR calc Af Amer: 34 mL/min/{1.73_m2} — ABNORMAL LOW (ref >59)
GFR calc non Af Amer: 29 mL/min/{1.73_m2} — ABNORMAL LOW (ref >59)
Glucose: 185 mg/dL — ABNORMAL HIGH (ref 65–99)
Potassium: 4.4 mmol/L (ref 3.5–5.2)
Sodium: 138 mmol/L (ref 134–144)

## 2018-06-10 NOTE — Progress Notes (Signed)
Hematology and Oncology Follow Up Visit  Ann Fletcher [id number] March 13, 1933 85 y.o. 06/10/2018   Principle Diagnosis:   Iron deficiency anemia --iron malabsorption and chronic blood loss  Current Therapy:    IV iron as indicated -- last dose of Feraheme on 02/07/2018     Interim History:  Ann Fletcher is back for follow-up.  She is having a little bit of pain issues with her neck.  She apparently passed out at home.  She hit her head on a table.  She complaining of some neck discomfort.  She is seeing her family doctor for this.  I think he put her on tramadol.  Her blood count is holding pretty steady.  We last saw her in January, her ferritin was 349 with an iron saturation of 25%.  She has had no bleeding issues.  She is on Eliquis.  She is doing it once a day.  I think she has chronic atrial fibrillation.  She has had no cough.  There is been no problems with her appetite.  She has had no diarrhea.  Overall, her performance status is ECOG 2.  Medications:  Current Outpatient Medications:  .  albuterol (PROVENTIL HFA;VENTOLIN HFA) 108 (90 BASE) MCG/ACT inhaler, Inhale 2 puffs into the lungs 3 (three) times daily as needed for wheezing or shortness of breath. , Disp: , Rfl:  .  apixaban (ELIQUIS) 5 MG TABS tablet, Take 5 mg by mouth daily., Disp: , Rfl:  .  atorvastatin (LIPITOR) 40 MG tablet, Take 40 mg by mouth at bedtime. , Disp: , Rfl:  .  benzonatate (TESSALON PERLES) 100 MG capsule, Take 1 capsule (100 mg total) by mouth 3 (three) times daily as needed for cough., Disp: 60 capsule, Rfl: 0 .  Calcium Carbonate-Vitamin D (OSCAL 500/200 D-3 PO), Take 1 tablet by mouth daily. , Disp: , Rfl:  .  docusate sodium (COLACE) 100 MG capsule, Take 1 capsule (100 mg total) by mouth 2 (two) times daily., Disp: 60 capsule, Rfl: 3 .  fluticasone (FLONASE) 50 MCG/ACT nasal spray, Place 1 spray into both nostrils daily as needed for allergies. , Disp: , Rfl:  .  furosemide (LASIX) 40 MG  tablet, Take 2 tablets (80 mg total) by mouth every morning AND 1 tablet (40 mg total) 2 (two) times daily at 10 am and 4 pm., Disp: 45 tablet, Rfl: 12 .  levothyroxine (SYNTHROID, LEVOTHROID) 100 MCG tablet, Take 100 mcg by mouth daily before breakfast. , Disp: , Rfl:  .  LORazepam (ATIVAN) 1 MG tablet, Take 1 mg by mouth 3 (three) times daily as needed for anxiety. , Disp: , Rfl:  .  metFORMIN (GLUCOPHAGE-XR) 500 MG 24 hr tablet, Take 1,000 mg by mouth 2 (two) times daily. , Disp: , Rfl:  .  pantoprazole (PROTONIX) 40 MG tablet, Take 1 tablet (40 mg total) by mouth daily., Disp: 30 tablet, Rfl: 3 .  potassium chloride SA (K-DUR,KLOR-CON) 20 MEQ tablet, Take 1 tablet (20 mEq total) by mouth daily., Disp: 90 tablet, Rfl: 3 .  traMADol (ULTRAM) 50 MG tablet, Take 50 mg by mouth every 8 (eight) hours as needed for moderate pain. , Disp: , Rfl:  .  vitamin E (VITAMIN E) 400 UNIT capsule, Take 400 Units by mouth daily., Disp: , Rfl:   Allergies:  Allergies  Allergen Reactions  . Pradaxa [Dabigatran Etexilate Mesylate] Nausea And Vomiting  . Nsaids Other (See Comments) and Itching    Stomach cramps Stomach cramps  .  Sulfa Antibiotics Nausea And Vomiting and Other (See Comments)    GI upset  . Tizanidine Hcl Other (See Comments)    Dizziness, patient reports fall  . Betadine [Povidone Iodine] Itching  . Povidone-Iodine Rash    Past Medical History, Surgical history, Social history, and Family History were reviewed and updated.  Review of Systems: Review of Systems  Constitutional: Negative.   HENT:  Negative.   Eyes: Negative.   Respiratory: Negative.   Cardiovascular: Negative.   Gastrointestinal: Negative.   Endocrine: Negative.   Genitourinary: Negative.    Musculoskeletal: Negative.   Skin: Negative.   Neurological: Negative.   Hematological: Negative.   Psychiatric/Behavioral: Negative.     Physical Exam:  weight is 137 lb (62.1 kg). Her oral temperature is 98.1 F (36.7  C). Her blood pressure is 161/53 (abnormal) and her pulse is 74. Her respiration is 17 and oxygen saturation is 99%.   Wt Readings from Last 3 Encounters:  06/10/18 137 lb (62.1 kg)  06/02/18 138 lb 3.2 oz (62.7 kg)  04/14/18 136 lb (61.7 kg)    Physical Exam Vitals signs reviewed.  HENT:     Head: Normocephalic and atraumatic.  Eyes:     Pupils: Pupils are equal, round, and reactive to light.  Neck:     Musculoskeletal: Normal range of motion.  Cardiovascular:     Rate and Rhythm: Normal rate and regular rhythm.     Heart sounds: Normal heart sounds.  Pulmonary:     Effort: Pulmonary effort is normal.     Breath sounds: Normal breath sounds.  Abdominal:     General: Bowel sounds are normal.     Palpations: Abdomen is soft.  Musculoskeletal: Normal range of motion.        General: No tenderness or deformity.  Lymphadenopathy:     Cervical: No cervical adenopathy.  Skin:    General: Skin is warm and dry.     Findings: No erythema or rash.  Neurological:     Mental Status: She is alert and oriented to person, place, and time.  Psychiatric:        Behavior: Behavior normal.        Thought Content: Thought content normal.        Judgment: Judgment normal.      Lab Results  Component Value Date   WBC 10.6 (H) 06/10/2018   HGB 10.6 (L) 06/10/2018   HCT 34.0 (L) 06/10/2018   MCV 85.9 06/10/2018   PLT 215 06/10/2018     Chemistry      Component Value Date/Time   NA 137 06/10/2018 1100   NA 138 06/09/2018 1407   K 4.5 06/10/2018 1100   CL 96 (L) 06/10/2018 1100   CO2 29 06/10/2018 1100   BUN 50 (H) 06/10/2018 1100   BUN 37 (H) 06/09/2018 1407   CREATININE 1.60 (H) 06/10/2018 1100   CREATININE 1.15 (H) 10/29/2014 1608      Component Value Date/Time   CALCIUM 10.2 06/10/2018 1100   ALKPHOS 81 06/10/2018 1100   AST 16 06/10/2018 1100   ALT 10 06/10/2018 1100   BILITOT 0.7 06/10/2018 1100       Impression and Plan: Ann Fletcher is an 85 year old white  female.  She has iron deficiency anemia.  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  Her hemoglobin is holding pretty steady.  Hopefully, her iron studies are not too bad.  It would not surprise me if her iron is  a little on the low side.  If so, we will have her come back for some IV iron.  I will plan to get her back to see me in another couple months.  We are clearly focused on her quality of life.  Volanda Napoleon, MD 3/10/202011:50 AM

## 2018-06-10 NOTE — Telephone Encounter (Addendum)
LEFT MESSAGE FOR THE PATIENT TO CALL BACK FOR LAB RESULTS   ----- Message from Almyra Deforest, [place] sent at 06/10/2018  2:42 PM EDT ----- Kidney function and electrolyte ok

## 2018-06-10 NOTE — Progress Notes (Signed)
Patient reports back pain x 8 weeks and saw another MD 7 weeks ago and he put her on muscle relaxers which made her "dizzy" and she fell and hit her head at home. No longer taking muscle relaxers

## 2018-06-10 NOTE — Progress Notes (Signed)
LEFT VOICE MESSAGE FOR THE PATIENT TO CALL BACK FOR LAB RESULTS

## 2018-06-11 ENCOUNTER — Other Ambulatory Visit: Payer: Self-pay

## 2018-06-11 ENCOUNTER — Encounter: Payer: Self-pay | Admitting: Podiatry

## 2018-06-11 ENCOUNTER — Telehealth: Payer: Self-pay | Admitting: *Deleted

## 2018-06-11 ENCOUNTER — Ambulatory Visit (INDEPENDENT_AMBULATORY_CARE_PROVIDER_SITE_OTHER): Payer: Medicare Other | Admitting: Podiatry

## 2018-06-11 DIAGNOSIS — M79676 Pain in unspecified toe(s): Secondary | ICD-10-CM

## 2018-06-11 DIAGNOSIS — E119 Type 2 diabetes mellitus without complications: Secondary | ICD-10-CM

## 2018-06-11 DIAGNOSIS — B351 Tinea unguium: Secondary | ICD-10-CM

## 2018-06-11 DIAGNOSIS — D689 Coagulation defect, unspecified: Secondary | ICD-10-CM

## 2018-06-11 LAB — IRON AND TIBC
Iron: 35 ug/dL — ABNORMAL LOW (ref 41–142)
Saturation Ratios: 14 % — ABNORMAL LOW (ref 21–57)
TIBC: 250 ug/dL (ref 236–444)
UIBC: 215 ug/dL (ref 120–384)

## 2018-06-11 LAB — FERRITIN: Ferritin: 393 ng/mL — ABNORMAL HIGH (ref 11–307)

## 2018-06-11 NOTE — Progress Notes (Signed)
Complaint:  Visit Type: Patient returns to my office for continued preventative foot care services. Complaint: Patient states" my nails have grown long and thick and become painful to walk and wear shoes" Patient has been diagnosed with DM with no foot complications. The patient presents for preventative foot care services. No changes to ROS.  Patient is taking eliquiss.  Podiatric Exam: Vascular: dorsalis pedis and posterior tibial pulses are palpable bilateral. Capillary return is immediate. Temperature gradient is WNL. Skin turgor WNL  Sensorium: Normal Semmes Weinstein monofilament test. Normal tactile sensation bilaterally. Nail Exam: Pt has thick disfigured discolored nails with subungual debris noted bilateral all nails except hallux toes  B/L Ulcer Exam: There is no evidence of ulcer or pre-ulcerative changes or infection. Orthopedic Exam: Muscle tone and strength are WNL. No limitations in general ROM. No crepitus or effusions noted. HAV  B/L.  Hammer toes second left Skin: No Porokeratosis. No infection or ulcers  Diagnosis:  Onychomycosis, , Pain in right toe, pain in left toes  Treatment & Plan Procedures and Treatment: Consent by patient was obtained for treatment procedures. The patient understood the discussion of treatment and procedures well. All questions were answered thoroughly reviewed. Debridement of mycotic and hypertrophic toenails, 1 through 5 bilateral and clearing of subungual debris. No ulceration, no infection noted.  Return Visit-Office Procedure: Patient instructed to return to the office for a follow up visit 3 months for continued evaluation and treatment.    Gardiner Barefoot DPM

## 2018-06-11 NOTE — Telephone Encounter (Signed)
Please call with lab results 

## 2018-06-11 NOTE — Telephone Encounter (Signed)
Called, spoke with assistant was helping patient, gave okay to give information.  No questions at this time.

## 2018-06-11 NOTE — Telephone Encounter (Addendum)
Message left on voicemail. Message sent to scheduler.   ----- Message from Volanda Napoleon, MD sent at 06/11/2018  8:55 AM EDT ----- Call her friend - Madelynn -- the iron is low.  She needs a dose of Feraheme.  Laurey Arrow

## 2018-06-12 ENCOUNTER — Telehealth: Payer: Self-pay | Admitting: Hematology & Oncology

## 2018-06-12 NOTE — Telephone Encounter (Signed)
Appointments scheduled LMVM for patient with date/time and asked that she call back to confirm appt per 3/11 sch msg

## 2018-06-16 ENCOUNTER — Telehealth: Payer: Self-pay

## 2018-06-16 NOTE — Progress Notes (Signed)
Results were given to patient's friend/healthcare advocate Jennings Books listed on patient's DPR. Mrs Dory Larsen verbalized understanding.  All questions (if any) were answered. Jacqulynn Cadet, [place] 06/16/2018 11:04 AM

## 2018-06-16 NOTE — Progress Notes (Signed)
Left voice message for the patient to callback for lab results.

## 2018-06-16 NOTE — Telephone Encounter (Signed)
Left voice message for the patient to callback for lab results.

## 2018-06-18 ENCOUNTER — Inpatient Hospital Stay: Payer: Medicare Other

## 2018-06-19 ENCOUNTER — Other Ambulatory Visit: Payer: Self-pay

## 2018-06-19 NOTE — Patient Outreach (Signed)
[facility]) Care Management  06/19/2018  Ann Fletcher 16-May-1932 [id number]   Medication Adherence call To [name] spoke with patient she is due on Atorvastatin 40 mg patient explain she is still taking 1 tablet daily and has medication at this time patient is showing past due under [place].   [place] Management Direct Dial [phone]  Fax [phone] [email or web address]

## 2018-07-16 ENCOUNTER — Other Ambulatory Visit: Payer: Self-pay

## 2018-07-16 NOTE — Patient Outreach (Signed)
[facility]) Care Management  07/16/2018  Ann Fletcher [birth year] [id number]  Medication Adherence call to Ann Fletcher Hippa Identifiers Verify spoke with patient she explain she is still taking 1 tablet 3 times a day on Metformin Er 500 mg and has plenty at this time she said pharmacy it's pretty good about delivering her medication every month. Ann Fletcher is showing past due under [place].   [place] Management Direct Dial [phone]  Fax [phone] [email or web address]

## 2018-07-28 ENCOUNTER — Other Ambulatory Visit (HOSPITAL_COMMUNITY): Payer: Self-pay | Admitting: *Deleted

## 2018-07-28 ENCOUNTER — Telehealth: Payer: Self-pay

## 2018-07-28 MED ORDER — APIXABAN 5 MG PO TABS: 5.0000 mg | ORAL_TABLET | Freq: Two times a day (BID) | ORAL | 0 refills | Status: DC

## 2018-07-28 NOTE — Telephone Encounter (Signed)
Call placed to Pt.  Rescheduled for in office visit in August.  Will continue to follow Pt remotely.

## 2018-08-05 ENCOUNTER — Encounter: Payer: Medicare Other | Admitting: Internal Medicine

## 2018-08-12 ENCOUNTER — Telehealth (HOSPITAL_COMMUNITY): Payer: Self-pay | Admitting: *Deleted

## 2018-08-12 NOTE — Telephone Encounter (Signed)
Patient called stating she had restarted her eliquis but has since stopped it again b/c she started having bleeding with bowel movements. Pt states she noticed blood in the toilet as well on the paper. Questioned pt on when she had originally stopped the eliquis but pt just said she had stopped it on her own before because she thought it may have contributed to her anemia she is being followed for. She resumed it when a new RX was sent in on 4/27. She has since stopped the medication again since she started bleeding. Reviewed stroke risk with patient but she states she is not restarting since she quit bleeding once she stopped taking it. Instructed pt she should contact her PCP to discuss rectal bleeding. Pt states she has appt with Dr. Marin Olp end of May and will discuss at that time. Encouraged pt to call Dr. Marin Olp office and inform today. Pt stated she would.

## 2018-08-26 ENCOUNTER — Ambulatory Visit (INDEPENDENT_AMBULATORY_CARE_PROVIDER_SITE_OTHER): Payer: Medicare Other | Admitting: *Deleted

## 2018-08-26 DIAGNOSIS — I482 Chronic atrial fibrillation, unspecified: Secondary | ICD-10-CM

## 2018-08-26 DIAGNOSIS — I442 Atrioventricular block, complete: Secondary | ICD-10-CM

## 2018-08-26 LAB — CUP PACEART REMOTE DEVICE CHECK
Battery Remaining Longevity: 120 mo
Battery Voltage: 3.03 V
Brady Statistic AP VP Percent: 0 %
Brady Statistic AP VS Percent: 0 %
Brady Statistic AS VP Percent: 99.98 %
Brady Statistic AS VS Percent: 0.02 %
Brady Statistic RA Percent Paced: 0 %
Brady Statistic RV Percent Paced: 99.98 %
Date Time Interrogation Session: 20200526044235
Implantable Lead Implant Date: 20190524
Implantable Lead Implant Date: 20190524
Implantable Lead Location: 753859
Implantable Lead Location: 753860
Implantable Lead Model: 3830
Implantable Lead Model: 5076
Implantable Pulse Generator Implant Date: 20190524
Lead Channel Impedance Value: 304 Ohm
Lead Channel Impedance Value: 361 Ohm
Lead Channel Impedance Value: 380 Ohm
Lead Channel Impedance Value: 475 Ohm
Lead Channel Pacing Threshold Amplitude: 0.625 V
Lead Channel Pacing Threshold Pulse Width: 0.4 ms
Lead Channel Sensing Intrinsic Amplitude: 1.5 mV
Lead Channel Sensing Intrinsic Amplitude: 5.25 mV
Lead Channel Sensing Intrinsic Amplitude: 5.25 mV
Lead Channel Setting Pacing Amplitude: 2.5 V
Lead Channel Setting Pacing Pulse Width: 0.4 ms
Lead Channel Setting Sensing Sensitivity: 4 mV

## 2018-08-28 ENCOUNTER — Other Ambulatory Visit: Payer: Self-pay

## 2018-08-28 ENCOUNTER — Inpatient Hospital Stay: Payer: Medicare Other

## 2018-08-28 ENCOUNTER — Inpatient Hospital Stay: Payer: Medicare Other | Attending: Hematology & Oncology | Admitting: Hematology & Oncology

## 2018-08-28 ENCOUNTER — Encounter: Payer: Self-pay | Admitting: Hematology & Oncology

## 2018-08-28 VITALS — BP 172/53 | HR 80 | Temp 97.8°F | Resp 18 | Wt 142.0 lb

## 2018-08-28 DIAGNOSIS — Z7982 Long term (current) use of aspirin: Secondary | ICD-10-CM | POA: Insufficient documentation

## 2018-08-28 DIAGNOSIS — Z79899 Other long term (current) drug therapy: Secondary | ICD-10-CM | POA: Insufficient documentation

## 2018-08-28 DIAGNOSIS — D631 Anemia in chronic kidney disease: Secondary | ICD-10-CM

## 2018-08-28 DIAGNOSIS — I4891 Unspecified atrial fibrillation: Secondary | ICD-10-CM | POA: Insufficient documentation

## 2018-08-28 DIAGNOSIS — K909 Intestinal malabsorption, unspecified: Secondary | ICD-10-CM | POA: Insufficient documentation

## 2018-08-28 DIAGNOSIS — D500 Iron deficiency anemia secondary to blood loss (chronic): Secondary | ICD-10-CM

## 2018-08-28 DIAGNOSIS — Z7984 Long term (current) use of oral hypoglycemic drugs: Secondary | ICD-10-CM | POA: Diagnosis not present

## 2018-08-28 HISTORY — DX: Anemia in chronic kidney disease: D63.1

## 2018-08-28 LAB — RETICULOCYTES
Immature Retic Fract: 14.6 % (ref 2.3–15.9)
RBC.: 3.33 MIL/uL — ABNORMAL LOW (ref 3.87–5.11)
Retic Count, Absolute: 44 10*3/uL (ref 19.0–186.0)
Retic Ct Pct: 1.3 % (ref 0.4–3.1)

## 2018-08-28 LAB — CBC WITH DIFFERENTIAL (CANCER CENTER ONLY)
Abs Immature Granulocytes: 0.02 10*3/uL (ref 0.00–0.07)
Basophils Absolute: 0 10*3/uL (ref 0.0–0.1)
Basophils Relative: 1 %
Eosinophils Absolute: 0.1 10*3/uL (ref 0.0–0.5)
Eosinophils Relative: 1 %
HCT: 29.2 % — ABNORMAL LOW (ref 36.0–46.0)
Hemoglobin: 9 g/dL — ABNORMAL LOW (ref 12.0–15.0)
Immature Granulocytes: 0 %
Lymphocytes Relative: 22 %
Lymphs Abs: 1.6 10*3/uL (ref 0.7–4.0)
MCH: 26.5 pg (ref 26.0–34.0)
MCHC: 30.8 g/dL (ref 30.0–36.0)
MCV: 86.1 fL (ref 80.0–100.0)
Monocytes Absolute: 0.8 10*3/uL (ref 0.1–1.0)
Monocytes Relative: 11 %
Neutro Abs: 4.8 10*3/uL (ref 1.7–7.7)
Neutrophils Relative %: 65 %
Platelet Count: 159 10*3/uL (ref 150–400)
RBC: 3.39 MIL/uL — ABNORMAL LOW (ref 3.87–5.11)
RDW: 14.4 % (ref 11.5–15.5)
WBC Count: 7.4 10*3/uL (ref 4.0–10.5)
nRBC: 0 % (ref 0.0–0.2)

## 2018-08-28 LAB — CMP (CANCER CENTER ONLY)
ALT: 12 U/L (ref 0–44)
AST: 22 U/L (ref 15–41)
Albumin: 4.5 g/dL (ref 3.5–5.0)
Alkaline Phosphatase: 65 U/L (ref 38–126)
Anion gap: 10 (ref 5–15)
BUN: 42 mg/dL — ABNORMAL HIGH (ref 8–23)
CO2: 28 mmol/L (ref 22–32)
Calcium: 10.3 mg/dL (ref 8.9–10.3)
Chloride: 103 mmol/L (ref 98–111)
Creatinine: 1.57 mg/dL — ABNORMAL HIGH (ref 0.44–1.00)
GFR, Est AFR Am: 35 mL/min — ABNORMAL LOW (ref >60)
GFR, Estimated: 30 mL/min — ABNORMAL LOW (ref >60)
Glucose, Bld: 111 mg/dL — ABNORMAL HIGH (ref 70–99)
Potassium: 5 mmol/L (ref 3.5–5.1)
Sodium: 141 mmol/L (ref 135–145)
Total Bilirubin: 0.5 mg/dL (ref 0.3–1.2)
Total Protein: 7.7 g/dL (ref 6.5–8.1)

## 2018-08-28 NOTE — Progress Notes (Signed)
Hematology and Oncology Follow Up Visit  Ann Fletcher [id number] [birth year] 85 y.o. 08/28/2018   Principle Diagnosis:   Iron deficiency anemia --iron malabsorption and chronic blood loss  Erythropoitin def anemia  Current Therapy:   IV iron as indicated -- last dose of Feraheme on 02/07/2018 Aranesp 300 mcg sq q 3 week for Hgb < 11     Interim History:  Ann Fletcher is back for follow-up.  She is doing okay.  The big change is that she has not taken her Eliquis.  She says that she has had bleeding with this.  She is on it for her atrial fibrillation.  I am unsure if her cardiologist is aware that she stop the Eliquis.  Her hemoglobin is down to 9 today.  I have a feeling that required to have to consider Aranesp with her.  Her erythropoietin level is only 35 so I think Aranesp would definitely be helpful if necessary.  She had iron studies done back in March.  Her ferritin was 393 with an iron saturation of 14%.  We may have to give her a couple doses of IV iron depending on what her levels are.  Her appetite is doing okay.  She has had no nausea or vomiting.  She has had no diarrhea.  She has had no leg swelling.  Ooverall, her performance status is ECOG 2.  Medications:  Current Outpatient Medications:  .  aspirin 81 MG chewable tablet, Chew 81 mg by mouth daily., Disp: , Rfl:  .  albuterol (PROVENTIL HFA;VENTOLIN HFA) 108 (90 BASE) MCG/ACT inhaler, Inhale 2 puffs into the lungs 3 (three) times daily as needed for wheezing or shortness of breath. , Disp: , Rfl:  .  atorvastatin (LIPITOR) 40 MG tablet, Take 40 mg by mouth at bedtime. , Disp: , Rfl:  .  benzonatate (TESSALON PERLES) 100 MG capsule, Take 1 capsule (100 mg total) by mouth 3 (three) times daily as needed for cough., Disp: 60 capsule, Rfl: 0 .  Calcium Carbonate-Vitamin D (OSCAL 500/200 D-3 PO), Take 1 tablet by mouth daily. , Disp: , Rfl:  .  docusate sodium (COLACE) 100 MG capsule, Take 1 capsule (100 mg total) by  mouth 2 (two) times daily., Disp: 60 capsule, Rfl: 3 .  fluticasone (FLONASE) 50 MCG/ACT nasal spray, Place 1 spray into both nostrils daily as needed for allergies. , Disp: , Rfl:  .  furosemide (LASIX) 40 MG tablet, Take 2 tablets (80 mg total) by mouth every morning AND 1 tablet (40 mg total) 2 (two) times daily at 10 am and 4 pm., Disp: 45 tablet, Rfl: 12 .  levothyroxine (SYNTHROID, LEVOTHROID) 100 MCG tablet, Take 100 mcg by mouth daily before breakfast. , Disp: , Rfl:  .  LORazepam (ATIVAN) 1 MG tablet, Take 1 mg by mouth 3 (three) times daily as needed for anxiety. , Disp: , Rfl:  .  metFORMIN (GLUCOPHAGE-XR) 500 MG 24 hr tablet, Take 1,000 mg by mouth 2 (two) times daily. , Disp: , Rfl:  .  pantoprazole (PROTONIX) 40 MG tablet, Take 1 tablet (40 mg total) by mouth daily., Disp: 30 tablet, Rfl: 3 .  potassium chloride SA (K-DUR,KLOR-CON) 20 MEQ tablet, Take 1 tablet (20 mEq total) by mouth daily., Disp: 90 tablet, Rfl: 3 .  traMADol (ULTRAM) 50 MG tablet, Take 50 mg by mouth every 8 (eight) hours as needed for moderate pain. , Disp: , Rfl:  .  vitamin E (VITAMIN E) 400 UNIT capsule, Take  400 Units by mouth daily., Disp: , Rfl:   Allergies:  Allergies  Allergen Reactions  . Pradaxa [Dabigatran Etexilate Mesylate] Nausea And Vomiting  . Nsaids Other (See Comments) and Itching    Stomach cramps Stomach cramps  . Sulfa Antibiotics Nausea And Vomiting and Other (See Comments)    GI upset  . Tizanidine Hcl Other (See Comments)    Dizziness, patient reports fall  . Betadine [Povidone Iodine] Itching  . Povidone-Iodine Rash    Past Medical History, Surgical history, Social history, and Family History were reviewed and updated.  Review of Systems: Review of Systems  Constitutional: Negative.   HENT:  Negative.   Eyes: Negative.   Respiratory: Negative.   Cardiovascular: Negative.   Gastrointestinal: Negative.   Endocrine: Negative.   Genitourinary: Negative.    Musculoskeletal:  Negative.   Skin: Negative.   Neurological: Negative.   Hematological: Negative.   Psychiatric/Behavioral: Negative.     Physical Exam:  weight is 142 lb (64.4 kg). Her oral temperature is 97.8 F (36.6 C). Her blood pressure is 172/53 (abnormal) and her pulse is 80. Her respiration is 18 and oxygen saturation is 100%.   Wt Readings from Last 3 Encounters:  08/28/18 142 lb (64.4 kg)  06/10/18 137 lb (62.1 kg)  06/02/18 138 lb 3.2 oz (62.7 kg)    Physical Exam Vitals signs reviewed.  HENT:     Head: Normocephalic and atraumatic.  Eyes:     Pupils: Pupils are equal, round, and reactive to light.  Neck:     Musculoskeletal: Normal range of motion.  Cardiovascular:     Rate and Rhythm: Normal rate and regular rhythm.     Heart sounds: Normal heart sounds.  Pulmonary:     Effort: Pulmonary effort is normal.     Breath sounds: Normal breath sounds.  Abdominal:     General: Bowel sounds are normal.     Palpations: Abdomen is soft.  Musculoskeletal: Normal range of motion.        General: No tenderness or deformity.  Lymphadenopathy:     Cervical: No cervical adenopathy.  Skin:    General: Skin is warm and dry.     Findings: No erythema or rash.  Neurological:     Mental Status: She is alert and oriented to person, place, and time.  Psychiatric:        Behavior: Behavior normal.        Thought Content: Thought content normal.        Judgment: Judgment normal.      Lab Results  Component Value Date   WBC 7.4 08/28/2018   HGB 9.0 (L) 08/28/2018   HCT 29.2 (L) 08/28/2018   MCV 86.1 08/28/2018   PLT 159 08/28/2018     Chemistry      Component Value Date/Time   NA 141 08/28/2018 1432   NA 138 06/09/2018 1407   K 5.0 08/28/2018 1432   CL 103 08/28/2018 1432   CO2 28 08/28/2018 1432   BUN 42 (H) 08/28/2018 1432   BUN 37 (H) 06/09/2018 1407   CREATININE 1.57 (H) 08/28/2018 1432   CREATININE 1.15 (H) 10/29/2014 1608      Component Value Date/Time   CALCIUM 10.3  08/28/2018 1432   ALKPHOS 65 08/28/2018 1432   AST 22 08/28/2018 1432   ALT 12 08/28/2018 1432   BILITOT 0.5 08/28/2018 1432       Impression and Plan: Ann Fletcher is an 85 year old white female.  She has  iron deficiency anemia.  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  Her hemoglobin is dropping.  Again, we will see what her iron studies look like.  We also may have to consider Aranesp for her.  This is all about her quality of life.  I would like to see her back in about 4 weeks or so.  We really need to be aggressive in order to get her blood count up a little bit better.    Volanda Napoleon, MD 5/28/20203:48 PM

## 2018-08-29 ENCOUNTER — Telehealth: Payer: Self-pay | Admitting: Hematology & Oncology

## 2018-08-29 LAB — FERRITIN: Ferritin: 169 ng/mL (ref 11–307)

## 2018-08-29 LAB — IRON AND TIBC
Iron: 32 ug/dL — ABNORMAL LOW (ref 41–142)
Saturation Ratios: 11 % — ABNORMAL LOW (ref 21–57)
TIBC: 275 ug/dL (ref 236–444)
UIBC: 243 ug/dL (ref 120–384)

## 2018-08-29 NOTE — Telephone Encounter (Signed)
lmom to inform pt of 7/9 appt 230 pm per 5/29 los

## 2018-09-01 ENCOUNTER — Telehealth: Payer: Self-pay | Admitting: Hematology & Oncology

## 2018-09-01 NOTE — Telephone Encounter (Signed)
Spoke with patient to confirm iron infusions 6/4 and 6/11 at 130 pm

## 2018-09-03 ENCOUNTER — Other Ambulatory Visit: Payer: Self-pay

## 2018-09-03 ENCOUNTER — Encounter: Payer: Self-pay | Admitting: Podiatry

## 2018-09-03 ENCOUNTER — Ambulatory Visit (INDEPENDENT_AMBULATORY_CARE_PROVIDER_SITE_OTHER): Payer: Medicare Other | Admitting: Podiatry

## 2018-09-03 DIAGNOSIS — B351 Tinea unguium: Secondary | ICD-10-CM

## 2018-09-03 DIAGNOSIS — M79676 Pain in unspecified toe(s): Secondary | ICD-10-CM

## 2018-09-03 DIAGNOSIS — E1121 Type 2 diabetes mellitus with diabetic nephropathy: Secondary | ICD-10-CM | POA: Diagnosis not present

## 2018-09-03 DIAGNOSIS — D689 Coagulation defect, unspecified: Secondary | ICD-10-CM

## 2018-09-03 DIAGNOSIS — E782 Mixed hyperlipidemia: Secondary | ICD-10-CM | POA: Diagnosis not present

## 2018-09-03 DIAGNOSIS — I129 Hypertensive chronic kidney disease with stage 1 through stage 4 chronic kidney disease, or unspecified chronic kidney disease: Secondary | ICD-10-CM | POA: Diagnosis not present

## 2018-09-03 DIAGNOSIS — E119 Type 2 diabetes mellitus without complications: Secondary | ICD-10-CM

## 2018-09-03 DIAGNOSIS — E1165 Type 2 diabetes mellitus with hyperglycemia: Secondary | ICD-10-CM | POA: Diagnosis not present

## 2018-09-03 NOTE — Progress Notes (Signed)
Complaint:  Visit Type: Patient returns to my office for continued preventative foot care services. Complaint: Patient states" my nails have grown long and thick and become painful to walk and wear shoes" Patient has been diagnosed with DM with no foot complications. The patient presents for preventative foot care services. No changes to ROS.  Patient is taking eliquiss.  Podiatric Exam: Vascular: dorsalis pedis and posterior tibial pulses are palpable bilateral. Capillary return is immediate. Temperature gradient is WNL. Skin turgor WNL  Sensorium: Normal Semmes Weinstein monofilament test. Normal tactile sensation bilaterally. Nail Exam: Pt has thick disfigured discolored nails with subungual debris noted bilateral all nails except hallux toes  B/L Ulcer Exam: There is no evidence of ulcer or pre-ulcerative changes or infection. Orthopedic Exam: Muscle tone and strength are WNL. No limitations in general ROM. No crepitus or effusions noted. HAV  B/L.  Hammer toes second left Skin: No Porokeratosis. No infection or ulcers  Diagnosis:  Onychomycosis, , Pain in right toe, pain in left toes  Treatment & Plan Procedures and Treatment: Consent by patient was obtained for treatment procedures. The patient understood the discussion of treatment and procedures well. All questions were answered thoroughly reviewed. Debridement of mycotic and hypertrophic toenails, 1 through 5 bilateral and clearing of subungual debris. No ulceration, no infection noted.  Return Visit-Office Procedure: Patient instructed to return to the office for a follow up visit 3 months for continued evaluation and treatment.    Gardiner Barefoot DPM

## 2018-09-04 ENCOUNTER — Inpatient Hospital Stay: Payer: Medicare Other | Attending: Hematology & Oncology

## 2018-09-04 VITALS — BP 163/63 | HR 76 | Temp 98.9°F | Resp 16

## 2018-09-04 DIAGNOSIS — D509 Iron deficiency anemia, unspecified: Secondary | ICD-10-CM | POA: Insufficient documentation

## 2018-09-04 DIAGNOSIS — D500 Iron deficiency anemia secondary to blood loss (chronic): Secondary | ICD-10-CM | POA: Diagnosis present

## 2018-09-04 DIAGNOSIS — Z79899 Other long term (current) drug therapy: Secondary | ICD-10-CM | POA: Diagnosis not present

## 2018-09-04 MED ORDER — SODIUM CHLORIDE 0.9 % IV SOLN
Freq: Once | INTRAVENOUS | Status: AC
  Administered 2018-09-04: 14:00:00 via INTRAVENOUS
  Filled 2018-09-04: qty 250

## 2018-09-04 MED ORDER — SODIUM CHLORIDE 0.9 % IV SOLN
510.0000 mg | Freq: Once | INTRAVENOUS | Status: AC
  Administered 2018-09-04: 510 mg via INTRAVENOUS
  Filled 2018-09-04: qty 17

## 2018-09-04 NOTE — Patient Instructions (Signed)

## 2018-09-08 DIAGNOSIS — E1165 Type 2 diabetes mellitus with hyperglycemia: Secondary | ICD-10-CM | POA: Diagnosis not present

## 2018-09-08 DIAGNOSIS — Z7189 Other specified counseling: Secondary | ICD-10-CM | POA: Diagnosis not present

## 2018-09-08 DIAGNOSIS — E1121 Type 2 diabetes mellitus with diabetic nephropathy: Secondary | ICD-10-CM | POA: Diagnosis not present

## 2018-09-08 DIAGNOSIS — I130 Hypertensive heart and chronic kidney disease with heart failure and stage 1 through stage 4 chronic kidney disease, or unspecified chronic kidney disease: Secondary | ICD-10-CM | POA: Diagnosis not present

## 2018-09-08 NOTE — Progress Notes (Signed)
Remote pacemaker transmission.   

## 2018-09-11 ENCOUNTER — Inpatient Hospital Stay: Payer: Medicare Other

## 2018-09-11 ENCOUNTER — Other Ambulatory Visit: Payer: Self-pay

## 2018-09-11 VITALS — BP 173/69 | HR 70 | Temp 98.1°F | Resp 16

## 2018-09-11 DIAGNOSIS — D500 Iron deficiency anemia secondary to blood loss (chronic): Secondary | ICD-10-CM

## 2018-09-11 DIAGNOSIS — Z79899 Other long term (current) drug therapy: Secondary | ICD-10-CM | POA: Diagnosis not present

## 2018-09-11 DIAGNOSIS — D509 Iron deficiency anemia, unspecified: Secondary | ICD-10-CM | POA: Diagnosis not present

## 2018-09-11 MED ORDER — SODIUM CHLORIDE 0.9 % IV SOLN
510.0000 mg | Freq: Once | INTRAVENOUS | Status: AC
  Administered 2018-09-11: 510 mg via INTRAVENOUS
  Filled 2018-09-11: qty 17

## 2018-09-11 MED ORDER — SODIUM CHLORIDE 0.9 % IV SOLN
Freq: Once | INTRAVENOUS | Status: AC
  Administered 2018-09-11: 14:00:00 via INTRAVENOUS
  Filled 2018-09-11: qty 250

## 2018-09-11 NOTE — Patient Instructions (Signed)

## 2018-09-17 ENCOUNTER — Other Ambulatory Visit: Payer: Self-pay

## 2018-09-17 NOTE — Patient Outreach (Signed)
[facility]) Care Management  09/17/2018  Ann Fletcher 12-17-1932 [id number]   Medication Adherence call to Ann Fletcher Hippa Identifiers Verify spoke with patient she is due on Metformin Er 500 mg and Atorvastatin 40 mg patient explain she is taking both medication and has medication at this time patient will order when she is finished with what she have. Ann Fletcher is showing past due under [place].   [place] Management Direct Dial [phone]  Fax [phone] [email or web address]

## 2018-10-09 ENCOUNTER — Inpatient Hospital Stay: Payer: Medicare Other | Attending: Hematology & Oncology | Admitting: Family

## 2018-10-09 ENCOUNTER — Other Ambulatory Visit: Payer: Self-pay | Admitting: Family

## 2018-10-09 ENCOUNTER — Inpatient Hospital Stay: Payer: Medicare Other

## 2018-10-09 ENCOUNTER — Other Ambulatory Visit: Payer: Self-pay

## 2018-10-09 VITALS — BP 182/68 | HR 81 | Resp 19 | Wt 139.0 lb

## 2018-10-09 DIAGNOSIS — D500 Iron deficiency anemia secondary to blood loss (chronic): Secondary | ICD-10-CM

## 2018-10-09 DIAGNOSIS — D631 Anemia in chronic kidney disease: Secondary | ICD-10-CM

## 2018-10-09 DIAGNOSIS — I4891 Unspecified atrial fibrillation: Secondary | ICD-10-CM

## 2018-10-09 DIAGNOSIS — E611 Iron deficiency: Secondary | ICD-10-CM | POA: Insufficient documentation

## 2018-10-09 DIAGNOSIS — R0602 Shortness of breath: Secondary | ICD-10-CM

## 2018-10-09 DIAGNOSIS — N189 Chronic kidney disease, unspecified: Secondary | ICD-10-CM | POA: Diagnosis not present

## 2018-10-09 DIAGNOSIS — Z79899 Other long term (current) drug therapy: Secondary | ICD-10-CM | POA: Diagnosis not present

## 2018-10-09 DIAGNOSIS — Z7982 Long term (current) use of aspirin: Secondary | ICD-10-CM | POA: Diagnosis not present

## 2018-10-09 LAB — CBC WITH DIFFERENTIAL (CANCER CENTER ONLY)
Abs Immature Granulocytes: 0.05 10*3/uL (ref 0.00–0.07)
Basophils Absolute: 0.1 10*3/uL (ref 0.0–0.1)
Basophils Relative: 1 %
Eosinophils Absolute: 0.2 10*3/uL (ref 0.0–0.5)
Eosinophils Relative: 2 %
HCT: 32.3 % — ABNORMAL LOW (ref 36.0–46.0)
Hemoglobin: 10 g/dL — ABNORMAL LOW (ref 12.0–15.0)
Immature Granulocytes: 1 %
Lymphocytes Relative: 27 %
Lymphs Abs: 2.3 10*3/uL (ref 0.7–4.0)
MCH: 27.4 pg (ref 26.0–34.0)
MCHC: 31 g/dL (ref 30.0–36.0)
MCV: 88.5 fL (ref 80.0–100.0)
Monocytes Absolute: 0.8 10*3/uL (ref 0.1–1.0)
Monocytes Relative: 10 %
Neutro Abs: 5 10*3/uL (ref 1.7–7.7)
Neutrophils Relative %: 59 %
Platelet Count: 180 10*3/uL (ref 150–400)
RBC: 3.65 MIL/uL — ABNORMAL LOW (ref 3.87–5.11)
RDW: 15.4 % (ref 11.5–15.5)
WBC Count: 8.3 10*3/uL (ref 4.0–10.5)
nRBC: 0 % (ref 0.0–0.2)

## 2018-10-09 LAB — CMP (CANCER CENTER ONLY)
ALT: 15 U/L (ref 0–44)
AST: 23 U/L (ref 15–41)
Albumin: 4.3 g/dL (ref 3.5–5.0)
Alkaline Phosphatase: 67 U/L (ref 38–126)
Anion gap: 9 (ref 5–15)
BUN: 38 mg/dL — ABNORMAL HIGH (ref 8–23)
CO2: 29 mmol/L (ref 22–32)
Calcium: 9.9 mg/dL (ref 8.9–10.3)
Chloride: 104 mmol/L (ref 98–111)
Creatinine: 1.25 mg/dL — ABNORMAL HIGH (ref 0.44–1.00)
GFR, Est AFR Am: 46 mL/min — ABNORMAL LOW (ref >60)
GFR, Estimated: 39 mL/min — ABNORMAL LOW (ref >60)
Glucose, Bld: 106 mg/dL — ABNORMAL HIGH (ref 70–99)
Potassium: 5.5 mmol/L — ABNORMAL HIGH (ref 3.5–5.1)
Sodium: 142 mmol/L (ref 135–145)
Total Bilirubin: 0.4 mg/dL (ref 0.3–1.2)
Total Protein: 7.5 g/dL (ref 6.5–8.1)

## 2018-10-09 LAB — RETICULOCYTES
Immature Retic Fract: 10.8 % (ref 2.3–15.9)
RBC.: 3.61 MIL/uL — ABNORMAL LOW (ref 3.87–5.11)
Retic Count, Absolute: 36.5 10*3/uL (ref 19.0–186.0)
Retic Ct Pct: 1 % (ref 0.4–3.1)

## 2018-10-09 MED ORDER — DARBEPOETIN ALFA 300 MCG/0.6ML IJ SOSY
300.0000 ug | PREFILLED_SYRINGE | Freq: Once | INTRAMUSCULAR | Status: AC
  Administered 2018-10-09: 300 ug via SUBCUTANEOUS

## 2018-10-09 MED ORDER — SODIUM CHLORIDE 0.9 % IV SOLN
Freq: Once | INTRAVENOUS | Status: DC
  Filled 2018-10-09: qty 250

## 2018-10-09 MED ORDER — DARBEPOETIN ALFA 300 MCG/0.6ML IJ SOSY
PREFILLED_SYRINGE | INTRAMUSCULAR | Status: AC
  Filled 2018-10-09: qty 0.6

## 2018-10-09 NOTE — Patient Instructions (Signed)
Darbepoetin Alfa injection What is this medicine? DARBEPOETIN ALFA (dar be POE e tin AL fa) helps your body make more red blood cells. It is used to treat anemia caused by chronic kidney failure and chemotherapy. This medicine may be used for other purposes; ask your health care provider or pharmacist if you have questions. COMMON BRAND NAME(S): Aranesp What should I tell my health care provider before I take this medicine? They need to know if you have any of these conditions:  blood clotting disorders or history of blood clots  cancer patient not on chemotherapy  cystic fibrosis  heart disease, such as angina, heart failure, or a history of a heart attack  hemoglobin level of 12 g/dL or greater  high blood pressure  low levels of folate, iron, or vitamin B12  seizures  an unusual or allergic reaction to darbepoetin, erythropoietin, albumin, hamster proteins, latex, other medicines, foods, dyes, or preservatives  pregnant or trying to get pregnant  breast-feeding How should I use this medicine? This medicine is for injection into a vein or under the skin. It is usually given by a health care professional in a hospital or clinic setting. If you get this medicine at home, you will be taught how to prepare and give this medicine. Use exactly as directed. Take your medicine at regular intervals. Do not take your medicine more often than directed. It is important that you put your used needles and syringes in a special sharps container. Do not put them in a trash can. If you do not have a sharps container, call your pharmacist or healthcare provider to get one. A special MedGuide will be given to you by the pharmacist with each prescription and refill. Be sure to read this information carefully each time. Talk to your pediatrician regarding the use of this medicine in children. While this medicine may be used in children as young as 1 month of age for selected conditions, precautions do  apply. Overdosage: If you think you have taken too much of this medicine contact a poison control center or emergency room at once. NOTE: This medicine is only for you. Do not share this medicine with others. What if I miss a dose? If you miss a dose, take it as soon as you can. If it is almost time for your next dose, take only that dose. Do not take double or extra doses. What may interact with this medicine? Do not take this medicine with any of the following medications:  epoetin alfa This list may not describe all possible interactions. Give your health care provider a list of all the medicines, herbs, non-prescription drugs, or dietary supplements you use. Also tell them if you smoke, drink alcohol, or use illegal drugs. Some items may interact with your medicine. What should I watch for while using this medicine? Your condition will be monitored carefully while you are receiving this medicine. You may need blood work done while you are taking this medicine. This medicine may cause a decrease in vitamin B6. You should make sure that you get enough vitamin B6 while you are taking this medicine. Discuss the foods you eat and the vitamins you take with your health care professional. What side effects may I notice from receiving this medicine? Side effects that you should report to your doctor or health care professional as soon as possible:  allergic reactions like skin rash, itching or hives, swelling of the face, lips, or tongue  breathing problems  changes in   vision  chest pain  confusion, trouble speaking or understanding  feeling faint or lightheaded, falls  high blood pressure  muscle aches or pains  pain, swelling, warmth in the leg  rapid weight gain  severe headaches  sudden numbness or weakness of the face, arm or leg  trouble walking, dizziness, loss of balance or coordination  seizures (convulsions)  swelling of the ankles, feet, hands  unusually weak or  tired Side effects that usually do not require medical attention (report to your doctor or health care professional if they continue or are bothersome):  diarrhea  fever, chills (flu-like symptoms)  headaches  nausea, vomiting  redness, stinging, or swelling at site where injected This list may not describe all possible side effects. Call your doctor for medical advice about side effects. You may report side effects to FDA at 1-800-FDA-1088. Where should I keep my medicine? Keep out of the reach of children. Store in a refrigerator between 2 and 8 degrees C (36 and 46 degrees F). Do not freeze. Do not shake. Throw away any unused portion if using a single-dose vial. Throw away any unused medicine after the expiration date. NOTE: This sheet is a summary. It may not cover all possible information. If you have questions about this medicine, talk to your doctor, pharmacist, or health care provider.  2020 Elsevier/Gold Standard (2017-04-03 16:44:20)  

## 2018-10-09 NOTE — Progress Notes (Signed)
Hematology and Oncology Follow Up Visit  Ann Fletcher [id number] [birth year] 85 y.o. 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 She is doing well and denies fatigue.  She has some mild SOB with exertion. Hgb is 10.0, MCV 88.  She denies any episodes of bleeding, no bruising or petechiae.  No fever, chills, n/v, cough, rash, dizziness, chest pain, palpitations, abdominal pain or changes in bowel or bladder habits.  No swelling, tenderness, numbness or tingling in her extremities.  No lymphadenopathy noted on exam.  She has maintained a good appetite and is staying well hydrated. Her weight is stable.   ECOG Performance Status: 2 - Symptomatic, <50% confined to bed  Medications:  Allergies as of 10/09/2018      Reactions   Pradaxa [dabigatran Etexilate Mesylate] Nausea And Vomiting   Nsaids Other (See Comments), Itching   Stomach cramps Stomach cramps   Sulfa Antibiotics Nausea And Vomiting, Other (See Comments)   GI upset   Tizanidine Hcl Other (See Comments)   Dizziness, patient reports fall   Betadine [povidone Iodine] Itching   Povidone-iodine Rash      Medication List       Accurate as of October 09, 2018  3:17 PM. If you have any questions, ask your nurse or doctor.        albuterol 108 (90 Base) MCG/ACT inhaler Commonly known as: VENTOLIN HFA Inhale 2 puffs into the lungs 3 (three) times daily as needed for wheezing or shortness of breath.   aspirin 81 MG chewable tablet Chew 81 mg by mouth daily.   atorvastatin 40 MG tablet Commonly known as: LIPITOR Take 40 mg by mouth at bedtime.   benzonatate 100 MG capsule Commonly known as: Tessalon Perles Take 1 capsule (100 mg total) by mouth 3  (three) times daily as needed for cough.   docusate sodium 100 MG capsule Commonly known as: Colace Take 1 capsule (100 mg total) by mouth 2 (two) times daily.   fluticasone 50 MCG/ACT nasal spray Commonly known as: FLONASE Place 1 spray into both nostrils daily as needed for allergies.   furosemide 40 MG tablet Commonly known as: LASIX Take 2 tablets (80 mg total) by mouth every morning AND 1 tablet (40 mg total) 2 (two) times daily at 10 am and 4 pm.   levothyroxine 100 MCG tablet Commonly known as: SYNTHROID Take 100 mcg by mouth daily before breakfast.   LORazepam 1 MG tablet Commonly known as: ATIVAN Take 1 mg by mouth 3 (three) times daily as needed for anxiety.   metFORMIN 500 MG 24 hr tablet Commonly known as: GLUCOPHAGE-XR Take 1,000 mg by mouth 2 (two) times daily.   OSCAL 500/200 D-3 PO Take 1 tablet by mouth daily.   pantoprazole 40 MG tablet Commonly known as: PROTONIX Take 1 tablet (40 mg total) by mouth daily.   potassium chloride SA 20 MEQ tablet Commonly known as: K-DUR Take 1 tablet (20 mEq total) by mouth daily.   traMADol 50 MG tablet Commonly known as: ULTRAM Take 50 mg by mouth every 8 (eight) hours as needed for moderate pain.   vitamin E 400 UNIT capsule Generic drug: vitamin E Take 400 Units by mouth daily.  Allergies:  Allergies  Allergen Reactions  . Pradaxa [Dabigatran Etexilate Mesylate] Nausea And Vomiting  . Nsaids Other (See Comments) and Itching    Stomach cramps Stomach cramps  . Sulfa Antibiotics Nausea And Vomiting and Other (See Comments)    GI upset  . Tizanidine Hcl Other (See Comments)    Dizziness, patient reports fall  . Betadine [Povidone Iodine] Itching  . Povidone-Iodine Rash    Past Medical History, Surgical history, Social history, and Family History were reviewed and updated.  Review of Systems: All other 10 point review of systems is negative.   Physical Exam:  weight is 139 lb (63 kg). Her  blood pressure is 182/68 (abnormal) and her pulse is 81. Her respiration is 19 and oxygen saturation is 98%.   Wt Readings from Last 3 Encounters:  10/09/18 139 lb (63 kg)  08/28/18 142 lb (64.4 kg)  06/10/18 137 lb (62.1 kg)    Ocular: Sclerae unicteric, pupils equal, round and reactive to light Ear-nose-throat: Oropharynx clear, dentition fair Lymphatic: No cervical or supraclavicular adenopathy Lungs no rales or rhonchi, good excursion bilaterally Heart regular rate and rhythm, no murmur appreciated Abd soft, nontender, positive bowel sounds, no liver or spleen tip palpated on exam, no fluid wave  MSK no focal spinal tenderness, no joint edema Neuro: non-focal, well-oriented, appropriate affect Breasts: Deferred   Lab Results  Component Value Date   WBC 8.3 10/09/2018   HGB 10.0 (L) 10/09/2018   HCT 32.3 (L) 10/09/2018   MCV 88.5 10/09/2018   PLT 180 10/09/2018   Lab Results  Component Value Date   FERRITIN 169 08/28/2018   IRON 32 (L) 08/28/2018   TIBC 275 08/28/2018   UIBC 243 08/28/2018   IRONPCTSAT 11 (L) 08/28/2018   Lab Results  Component Value Date   RETICCTPCT 1.0 10/09/2018   RBC 3.61 (L) 10/09/2018   No results found for: KPAFRELGTCHN, LAMBDASER, KAPLAMBRATIO No results found for: IGGSERUM, IGA, IGMSERUM No results found for: Odetta Pink, SPEI   Chemistry      Component Value Date/Time   NA 141 08/28/2018 1432   NA 138 06/09/2018 1407   K 5.0 08/28/2018 1432   CL 103 08/28/2018 1432   CO2 28 08/28/2018 1432   BUN 42 (H) 08/28/2018 1432   BUN 37 (H) 06/09/2018 1407   CREATININE 1.57 (H) 08/28/2018 1432   CREATININE 1.15 (H) 10/29/2014 1608      Component Value Date/Time   CALCIUM 10.3 08/28/2018 1432   ALKPHOS 65 08/28/2018 1432   AST 22 08/28/2018 1432   ALT 12 08/28/2018 1432   BILITOT 0.5 08/28/2018 1432       Impression and Plan: Ann Fletcher is a very pleasant 85 yo caucasian female  with both iron deficiency ane erythropoietin deficiency anemias.  Hgb is 10.0. We rechecked her BP and it was down 127/67, HP 81 after she had been sitting in the waiting room for a bit.  She received Aranesp today.  We will see her back in another 4 weeks.   She will contact our office with any questions or concerns. We can certainly see her sooner if needed.   [name], NP 7/9/20203:17 PM

## 2018-10-10 ENCOUNTER — Telehealth: Payer: Self-pay | Admitting: *Deleted

## 2018-10-10 LAB — FERRITIN: Ferritin: 753 ng/mL — ABNORMAL HIGH (ref 11–307)

## 2018-10-10 LAB — IRON AND TIBC
Iron: 59 ug/dL (ref 41–142)
Saturation Ratios: 23 % (ref 21–57)
TIBC: 260 ug/dL (ref 236–444)
UIBC: 201 ug/dL (ref 120–384)

## 2018-10-10 NOTE — Telephone Encounter (Signed)
-----   Message from Volanda Napoleon, MD sent at 10/10/2018 11:44 AM EDT ----- Call her friend -- Clearance Coots -- tell her the iron level is great!!  Laurey Arrow

## 2018-10-10 NOTE — Telephone Encounter (Signed)
As noted below by Dr. Marin Olp, I left a message for [facility], daughter, informing her that her mom's iron level is great. Instructed her to call if she had any questions.

## 2018-10-13 ENCOUNTER — Other Ambulatory Visit: Payer: Self-pay | Admitting: Family

## 2018-11-05 ENCOUNTER — Encounter: Payer: Medicare Other | Admitting: Internal Medicine

## 2018-11-06 ENCOUNTER — Telehealth: Payer: Self-pay | Admitting: Family

## 2018-11-06 ENCOUNTER — Other Ambulatory Visit: Payer: Self-pay

## 2018-11-06 ENCOUNTER — Inpatient Hospital Stay: Payer: Medicare Other

## 2018-11-06 ENCOUNTER — Inpatient Hospital Stay: Payer: Medicare Other | Attending: Hematology & Oncology | Admitting: Family

## 2018-11-06 VITALS — BP 157/69 | HR 86 | Temp 97.3°F | Resp 19 | Wt 136.1 lb

## 2018-11-06 DIAGNOSIS — D631 Anemia in chronic kidney disease: Secondary | ICD-10-CM

## 2018-11-06 DIAGNOSIS — Z79899 Other long term (current) drug therapy: Secondary | ICD-10-CM | POA: Insufficient documentation

## 2018-11-06 DIAGNOSIS — Z7984 Long term (current) use of oral hypoglycemic drugs: Secondary | ICD-10-CM | POA: Diagnosis not present

## 2018-11-06 DIAGNOSIS — Z7982 Long term (current) use of aspirin: Secondary | ICD-10-CM | POA: Diagnosis not present

## 2018-11-06 DIAGNOSIS — R0602 Shortness of breath: Secondary | ICD-10-CM | POA: Insufficient documentation

## 2018-11-06 DIAGNOSIS — D500 Iron deficiency anemia secondary to blood loss (chronic): Secondary | ICD-10-CM | POA: Diagnosis not present

## 2018-11-06 LAB — CBC WITH DIFFERENTIAL (CANCER CENTER ONLY)
Abs Immature Granulocytes: 0.03 10*3/uL (ref 0.00–0.07)
Basophils Absolute: 0.1 10*3/uL (ref 0.0–0.1)
Basophils Relative: 1 %
Eosinophils Absolute: 0.1 10*3/uL (ref 0.0–0.5)
Eosinophils Relative: 1 %
HCT: 39.6 % (ref 36.0–46.0)
Hemoglobin: 12.5 g/dL (ref 12.0–15.0)
Immature Granulocytes: 0 %
Lymphocytes Relative: 25 %
Lymphs Abs: 2.2 10*3/uL (ref 0.7–4.0)
MCH: 27.6 pg (ref 26.0–34.0)
MCHC: 31.6 g/dL (ref 30.0–36.0)
MCV: 87.4 fL (ref 80.0–100.0)
Monocytes Absolute: 0.9 10*3/uL (ref 0.1–1.0)
Monocytes Relative: 10 %
Neutro Abs: 5.5 10*3/uL (ref 1.7–7.7)
Neutrophils Relative %: 63 %
Platelet Count: 175 10*3/uL (ref 150–400)
RBC: 4.53 MIL/uL (ref 3.87–5.11)
RDW: 15.3 % (ref 11.5–15.5)
WBC Count: 8.7 10*3/uL (ref 4.0–10.5)
nRBC: 0 % (ref 0.0–0.2)

## 2018-11-06 LAB — CMP (CANCER CENTER ONLY)
ALT: 19 U/L (ref 0–44)
AST: 26 U/L (ref 15–41)
Albumin: 4.2 g/dL (ref 3.5–5.0)
Alkaline Phosphatase: 68 U/L (ref 38–126)
Anion gap: 9 (ref 5–15)
BUN: 41 mg/dL — ABNORMAL HIGH (ref 8–23)
CO2: 28 mmol/L (ref 22–32)
Calcium: 10.5 mg/dL — ABNORMAL HIGH (ref 8.9–10.3)
Chloride: 101 mmol/L (ref 98–111)
Creatinine: 1.38 mg/dL — ABNORMAL HIGH (ref 0.44–1.00)
GFR, Est AFR Am: 41 mL/min — ABNORMAL LOW (ref >60)
GFR, Estimated: 35 mL/min — ABNORMAL LOW (ref >60)
Glucose, Bld: 168 mg/dL — ABNORMAL HIGH (ref 70–99)
Potassium: 5.5 mmol/L — ABNORMAL HIGH (ref 3.5–5.1)
Sodium: 138 mmol/L (ref 135–145)
Total Bilirubin: 0.4 mg/dL (ref 0.3–1.2)
Total Protein: 7.3 g/dL (ref 6.5–8.1)

## 2018-11-06 LAB — RETICULOCYTES
Immature Retic Fract: 3.6 % (ref 2.3–15.9)
RBC.: 4.52 MIL/uL (ref 3.87–5.11)
Retic Count, Absolute: 30.7 10*3/uL (ref 19.0–186.0)
Retic Ct Pct: 0.7 % (ref 0.4–3.1)

## 2018-11-06 MED ORDER — DARBEPOETIN ALFA 300 MCG/0.6ML IJ SOSY
PREFILLED_SYRINGE | INTRAMUSCULAR | Status: AC
  Filled 2018-11-06: qty 0.6

## 2018-11-06 NOTE — Progress Notes (Signed)
Hematology and Oncology Follow Up Visit  Ann Fletcher [id number] 03-28-1933 85 y.o. 11/06/2018   Principle Diagnosis:  Iron deficiency anemia -- iron malabsorption and chronic blood loss Erythropoitin deficiency anemia  Current Therapy:   IV iron as indicated -- last dose of Feraheme on 02/07/2018 Aranesp 300 mcg sq q 3 week for Hgb < 11   Interim History:  Ann Fletcher is here today for follow-up. She is doing well and has no complaints at this time.  Hgb is much improved at 12.5, MCV 87.  No bleeding, no bruising or petechiae.  No fever, chills, n/v, cough, rash, dizziness, chest pain, palpitations, abdominal pain or changes in bowel or bladder habits.  She has occasional SOB with over exertion and will just a take break to rest when needed.  She is ambulating nicely with her rolling seat walker. No falls or syncopal episodes to report.  She has maintained a good appetite and is staying well hydrated. Her weight is stable.   ECOG Performance Status: 1 - Symptomatic but completely ambulatory  Medications:  Allergies as of 11/06/2018      Reactions   Pradaxa [dabigatran Etexilate Mesylate] Nausea And Vomiting   Nsaids Other (See Comments), Itching   Stomach cramps Stomach cramps   Sulfa Antibiotics Nausea And Vomiting, Other (See Comments)   GI upset   Tizanidine Hcl Other (See Comments)   Dizziness, patient reports fall   Betadine [povidone Iodine] Itching   Povidone-iodine Rash      Medication List       Accurate as of November 06, 2018  3:31 PM. If you have any questions, ask your nurse or doctor.        albuterol 108 (90 Base) MCG/ACT inhaler Commonly known as: VENTOLIN HFA Inhale 2 puffs into the lungs 3 (three) times daily as needed for wheezing or shortness of breath.   aspirin 81 MG chewable tablet Chew 81 mg by mouth daily.   atorvastatin 40 MG tablet Commonly known as: LIPITOR Take 40 mg by mouth at bedtime.   benzonatate 100 MG capsule Commonly known as:  Tessalon Perles Take 1 capsule (100 mg total) by mouth 3 (three) times daily as needed for cough.   docusate sodium 100 MG capsule Commonly known as: Colace Take 1 capsule (100 mg total) by mouth 2 (two) times daily.   fluticasone 50 MCG/ACT nasal spray Commonly known as: FLONASE Place 1 spray into both nostrils daily as needed for allergies.   furosemide 40 MG tablet Commonly known as: LASIX Take 2 tablets (80 mg total) by mouth every morning AND 1 tablet (40 mg total) 2 (two) times daily at 10 am and 4 pm.   levothyroxine 100 MCG tablet Commonly known as: SYNTHROID Take 100 mcg by mouth daily before breakfast.   LORazepam 1 MG tablet Commonly known as: ATIVAN Take 1 mg by mouth 3 (three) times daily as needed for anxiety.   metFORMIN 500 MG 24 hr tablet Commonly known as: GLUCOPHAGE-XR Take 1,000 mg by mouth 2 (two) times daily.   OSCAL 500/200 D-3 PO Take 1 tablet by mouth daily.   pantoprazole 40 MG tablet Commonly known as: PROTONIX Take 1 tablet (40 mg total) by mouth daily.   potassium chloride SA 20 MEQ tablet Commonly known as: K-DUR Take 1 tablet (20 mEq total) by mouth daily.   traMADol 50 MG tablet Commonly known as: ULTRAM Take 50 mg by mouth every 8 (eight) hours as needed for moderate pain.  vitamin E 400 UNIT capsule Generic drug: vitamin E Take 400 Units by mouth daily.       Allergies:  Allergies  Allergen Reactions  . Pradaxa [Dabigatran Etexilate Mesylate] Nausea And Vomiting  . Nsaids Other (See Comments) and Itching    Stomach cramps Stomach cramps  . Sulfa Antibiotics Nausea And Vomiting and Other (See Comments)    GI upset  . Tizanidine Hcl Other (See Comments)    Dizziness, patient reports fall  . Betadine [Povidone Iodine] Itching  . Povidone-Iodine Rash    Past Medical History, Surgical history, Social history, and Family History were reviewed and updated.  Review of Systems: All other 10 point review of systems is  negative.   Physical Exam:  vitals were not taken for this visit.   Wt Readings from Last 3 Encounters:  10/09/18 139 lb (63 kg)  08/28/18 142 lb (64.4 kg)  06/10/18 137 lb (62.1 kg)    Ocular: Sclerae unicteric, pupils equal, round and reactive to light Ear-nose-throat: Oropharynx clear, dentition fair Lymphatic: No cervical or supraclavicular adenopathy Lungs no rales or rhonchi, good excursion bilaterally Heart regular rate and rhythm, no murmur appreciated Abd soft, nontender, positive bowel sounds, no liver or spleen tip palpated on exam, no fluid wave  MSK no focal spinal tenderness, no joint edema Neuro: non-focal, well-oriented, appropriate affect Breasts: Deferred   Lab Results  Component Value Date   WBC 8.7 11/06/2018   HGB 12.5 11/06/2018   HCT 39.6 11/06/2018   MCV 87.4 11/06/2018   PLT 175 11/06/2018   Lab Results  Component Value Date   FERRITIN 753 (H) 10/09/2018   IRON 59 10/09/2018   TIBC 260 10/09/2018   UIBC 201 10/09/2018   IRONPCTSAT 23 10/09/2018   Lab Results  Component Value Date   RETICCTPCT 0.7 11/06/2018   RBC 4.52 11/06/2018   No results found for: KPAFRELGTCHN, LAMBDASER, KAPLAMBRATIO No results found for: IGGSERUM, IGA, IGMSERUM No results found for: Odetta Pink, SPEI   Chemistry      Component Value Date/Time   NA 142 10/09/2018 1437   NA 138 06/09/2018 1407   K 5.5 (H) 10/09/2018 1437   CL 104 10/09/2018 1437   CO2 29 10/09/2018 1437   BUN 38 (H) 10/09/2018 1437   BUN 37 (H) 06/09/2018 1407   CREATININE 1.25 (H) 10/09/2018 1437   CREATININE 1.15 (H) 10/29/2014 1608      Component Value Date/Time   CALCIUM 9.9 10/09/2018 1437   ALKPHOS 67 10/09/2018 1437   AST 23 10/09/2018 1437   ALT 15 10/09/2018 1437   BILITOT 0.4 10/09/2018 1437       Impression and Plan: Ann Fletcher is a very pleasant 85 yo caucasian female with both iron deficiency ane erythropoietin  deficiency anemias.  Hgb today is 12.5 so no Aranesp need.  We will plan to see her back in another month.  She will contact our office with any questions or concerns. We can certainly see her sooner if needed.    [name], NP 8/6/20203:31 PM

## 2018-11-06 NOTE — Telephone Encounter (Signed)
Appointments scheduled calendar printed per 8/6 los 

## 2018-11-07 ENCOUNTER — Telehealth: Payer: Self-pay | Admitting: Family

## 2018-11-07 LAB — IRON AND TIBC
Iron: 54 ug/dL (ref 41–142)
Saturation Ratios: 20 % — ABNORMAL LOW (ref 21–57)
TIBC: 265 ug/dL (ref 236–444)
UIBC: 212 ug/dL (ref 120–384)

## 2018-11-07 LAB — FERRITIN: Ferritin: 512 ng/mL — ABNORMAL HIGH (ref 11–307)

## 2018-11-07 NOTE — Telephone Encounter (Signed)
Called and LMVM regarding appointment date/time per 8/7 sch msg

## 2018-11-10 ENCOUNTER — Inpatient Hospital Stay: Payer: Medicare Other

## 2018-11-10 ENCOUNTER — Other Ambulatory Visit: Payer: Self-pay

## 2018-11-10 VITALS — BP 177/79 | HR 71 | Temp 97.0°F | Resp 18

## 2018-11-10 DIAGNOSIS — R0602 Shortness of breath: Secondary | ICD-10-CM | POA: Diagnosis not present

## 2018-11-10 DIAGNOSIS — Z7984 Long term (current) use of oral hypoglycemic drugs: Secondary | ICD-10-CM | POA: Diagnosis not present

## 2018-11-10 DIAGNOSIS — Z79899 Other long term (current) drug therapy: Secondary | ICD-10-CM | POA: Diagnosis not present

## 2018-11-10 DIAGNOSIS — D500 Iron deficiency anemia secondary to blood loss (chronic): Secondary | ICD-10-CM | POA: Diagnosis not present

## 2018-11-10 DIAGNOSIS — Z7982 Long term (current) use of aspirin: Secondary | ICD-10-CM | POA: Diagnosis not present

## 2018-11-10 MED ORDER — SODIUM CHLORIDE 0.9 % IV SOLN
200.0000 mg | Freq: Once | INTRAVENOUS | Status: AC
  Administered 2018-11-10: 200 mg via INTRAVENOUS
  Filled 2018-11-10: qty 10

## 2018-11-10 MED ORDER — SODIUM CHLORIDE 0.9 % IV SOLN
Freq: Once | INTRAVENOUS | Status: AC
  Administered 2018-11-10: 14:00:00 via INTRAVENOUS
  Filled 2018-11-10: qty 250

## 2018-11-10 NOTE — Patient Instructions (Signed)

## 2018-11-15 ENCOUNTER — Other Ambulatory Visit: Payer: Self-pay | Admitting: Physician Assistant

## 2018-11-24 ENCOUNTER — Telehealth: Payer: Self-pay | Admitting: Family

## 2018-11-24 NOTE — Telephone Encounter (Signed)
Called / spoke with Caregiver regarding appointments being moved from 9/8 to 9/10 due to Provider PAL.  She was ok with new date/time

## 2018-11-26 ENCOUNTER — Ambulatory Visit (INDEPENDENT_AMBULATORY_CARE_PROVIDER_SITE_OTHER): Payer: Medicare Other | Admitting: *Deleted

## 2018-11-26 DIAGNOSIS — I442 Atrioventricular block, complete: Secondary | ICD-10-CM

## 2018-11-26 LAB — CUP PACEART REMOTE DEVICE CHECK
Battery Remaining Longevity: 127 mo
Battery Voltage: 3.03 V
Brady Statistic AP VP Percent: 0 %
Brady Statistic AP VS Percent: 0 %
Brady Statistic AS VP Percent: 99.97 %
Brady Statistic AS VS Percent: 0.03 %
Brady Statistic RA Percent Paced: 0 %
Brady Statistic RV Percent Paced: 99.97 %
Date Time Interrogation Session: 20200826045058
Implantable Lead Implant Date: 20190524
Implantable Lead Implant Date: 20190524
Implantable Lead Location: 753859
Implantable Lead Location: 753860
Implantable Lead Model: 3830
Implantable Lead Model: 5076
Implantable Pulse Generator Implant Date: 20190524
Lead Channel Impedance Value: 361 Ohm
Lead Channel Impedance Value: 380 Ohm
Lead Channel Impedance Value: 475 Ohm
Lead Channel Impedance Value: 532 Ohm
Lead Channel Pacing Threshold Amplitude: 0.875 V
Lead Channel Pacing Threshold Pulse Width: 0.4 ms
Lead Channel Sensing Intrinsic Amplitude: 1.5 mV
Lead Channel Sensing Intrinsic Amplitude: 5.25 mV
Lead Channel Sensing Intrinsic Amplitude: 5.25 mV
Lead Channel Setting Pacing Amplitude: 2.5 V
Lead Channel Setting Pacing Pulse Width: 0.4 ms
Lead Channel Setting Sensing Sensitivity: 4 mV

## 2018-12-02 ENCOUNTER — Ambulatory Visit (INDEPENDENT_AMBULATORY_CARE_PROVIDER_SITE_OTHER): Payer: Medicare Other | Admitting: Internal Medicine

## 2018-12-02 ENCOUNTER — Encounter: Payer: Self-pay | Admitting: Internal Medicine

## 2018-12-02 ENCOUNTER — Other Ambulatory Visit: Payer: Self-pay

## 2018-12-02 VITALS — BP 158/70 | HR 95 | Ht 62.0 in | Wt 133.0 lb

## 2018-12-02 DIAGNOSIS — I5032 Chronic diastolic (congestive) heart failure: Secondary | ICD-10-CM

## 2018-12-02 DIAGNOSIS — Z950 Presence of cardiac pacemaker: Secondary | ICD-10-CM

## 2018-12-02 DIAGNOSIS — I4819 Other persistent atrial fibrillation: Secondary | ICD-10-CM | POA: Diagnosis not present

## 2018-12-02 NOTE — Patient Instructions (Signed)
Medication Instructions:  Your physician recommends that you continue on your current medications as directed. Please refer to the Current Medication list given to you today.  Labwork: None ordered.  Testing/Procedures: None ordered.  Follow-Up: Your physician wants you to follow-up in: one year with Dr. Lovena Le.   You will receive a reminder letter in the mail two months in advance. If you don't receive a letter, please call our office to schedule the follow-up appointment.  Remote monitoring is used to monitor your Pacemaker from home. This monitoring reduces the number of office visits required to check your device to one time per year. It allows [place] to keep an eye on the functioning of your device to ensure it is working properly. You are scheduled for a device check from home on 03/03/2019. You may send your transmission at any time that day. If you have a wireless device, the transmission will be sent automatically. After your physician reviews your transmission, you will receive a postcard with your next transmission date.  Any Other Special Instructions Will Be Listed Below (If Applicable).  If you need a refill on your cardiac medications before your next appointment, please call your pharmacy.

## 2018-12-02 NOTE — Progress Notes (Signed)
HPI Ann Fletcher returns today for followup of her atrial fib. She is a pleasant 85 yo woman with uncontrolled atrial fib who underwent AV node ablation and insertion of a DDD PM with atrial lead in the his bundle location. She has done well in the interim. She was noted to have non-capture of her His lead the day after her procedure. To obtain CHB, we had to ablate fairly close to her AV node. 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 She has done well in the interim. She denies palpitations, chest pain or sob. She has mild dyspnea with exertion but she uses a walker.  Allergies  Allergen Reactions  . Pradaxa [Dabigatran Etexilate Mesylate] Nausea And Vomiting  . Nsaids Other (See Comments) and Itching    Stomach cramps Stomach cramps  . Sulfa Antibiotics Nausea And Vomiting and Other (See Comments)    GI upset  . Tizanidine Hcl Other (See Comments)    Dizziness, patient reports fall  . Betadine [Povidone Iodine] Itching  . Povidone-Iodine Rash     Current Outpatient Medications  Medication Sig Dispense Refill  . albuterol (PROVENTIL HFA;VENTOLIN HFA) 108 (90 BASE) MCG/ACT inhaler Inhale 2 puffs into the lungs 3 (three) times daily as needed for wheezing or shortness of breath.     [name] aspirin 81 MG chewable tablet Chew 81 mg by mouth daily.    [name] atorvastatin (LIPITOR) 40 MG tablet Take 40 mg by mouth at bedtime.     . benzonatate (TESSALON PERLES) 100 MG capsule Take 1 capsule (100 mg total) by mouth 3 (three) times daily as needed for cough. 60 capsule 0  . Calcium Carbonate-Vitamin D (OSCAL 500/200 D-3 PO) Take 1 tablet by mouth daily.     . fluticasone (FLONASE) 50 MCG/ACT nasal spray Place 1 spray into both nostrils daily as needed for allergies.     . furosemide (LASIX) 40 MG tablet Take 2 tablets (80 mg total) by mouth every morning AND 1 tablet (40 mg total) 2 (two) times daily at 10 am and 4 pm. 45 tablet 12  . furosemide (LASIX) 80 MG tablet TAKE ONE WHOLE  TABLET (80MG ) IN THE MORNING AND HALF TABLET (40 MG) IN THE EVENING. 45 tablet 5  . levothyroxine (SYNTHROID, LEVOTHROID) 100 MCG tablet Take 100 mcg by mouth daily before breakfast.     . LORazepam (ATIVAN) 1 MG tablet Take 1 mg by mouth 3 (three) times daily as needed for anxiety.     . metFORMIN (GLUCOPHAGE-XR) 500 MG 24 hr tablet Take 1,000 mg by mouth 2 (two) times daily.     . pantoprazole (PROTONIX) 40 MG tablet Take 1 tablet (40 mg total) by mouth daily. 30 tablet 3  . potassium chloride SA (K-DUR,KLOR-CON) 20 MEQ tablet Take 1 tablet (20 mEq total) by mouth daily. 90 tablet 3  . traMADol (ULTRAM) 50 MG tablet Take 50 mg by mouth every 8 (eight) hours as needed for moderate pain.     . vitamin E (VITAMIN E) 400 UNIT capsule Take 400 Units by mouth daily.     No current facility-administered medications for this visit.      Past Medical History:  Diagnosis Date  . Anxiety   . Arthritis   . AV node arrhythmia    a. s/p AV node ablation/PPM 07/2017.  [name] Chronic diastolic CHF (congestive heart failure) ([place])    a. Acute exacerbation occurred in setting of AF.  [name] CKD (chronic kidney  disease), stage III ([place])   . Diabetes mellitus without complication ([place])    type 2  . 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 H/O bladder infections   . Hyperlipidemia   . Hypertension   . Hypothyroidism   . Lower extremity edema   . Osteoporosis   . Persistent atrial fibrillation    a. diagnosed in 10/2014 by PCP, underwent failed TEE DCCV on 10/08/14, loaded with amio, successfully DCCV on 10/15/14.    ROS:   All systems reviewed and negative except as noted in the HPI.   Past Surgical History:  Procedure Laterality Date  . AV NODE ABLATION N/A 08/23/2017   Procedure: AV NODE ABLATION;  Surgeon: [name], MD;  Location: [place] [facility];  Service: Cardiovascular;  Laterality: N/A;  . BACK SURGERY  2001, 2003, 2006   x 3  . BIOPSY  11/06/2017   Procedure: BIOPSY;  Surgeon: [name], MD;  Location: [place];  Service: Endoscopy;;  . CARDIOVERSION N/A 10/08/2014   Procedure: CARDIOVERSION;  Surgeon: [name], MD;  Location: [place];  Service: Cardiovascular;  Laterality: N/A;  . CARDIOVERSION N/A 10/15/2014   Procedure: CARDIOVERSION;  Surgeon: [name], MD;  Location: [facility] ENDOSCOPY;  Service: Cardiovascular;  Laterality: N/A;  . CARDIOVERSION N/A 11/15/2014   Procedure: CARDIOVERSION;  Surgeon: [name], MD;  Location: [place];  Service: Cardiovascular;  Laterality: N/A;  . COLONOSCOPY N/A 11/06/2017   Procedure: COLONOSCOPY;  Surgeon: [name], MD;  Location[facility] ENDOSCOPY;  Service: Endoscopy;  Laterality: N/A;  . ESOPHAGOGASTRODUODENOSCOPY N/A 11/06/2017   Procedure: ESOPHAGOGASTRODUODENOSCOPY (EGD);  Surgeon: [name], MD;  Location: [facility] ENDOSCOPY;  Service: Endoscopy;  Laterality: N/A;  . PACEMAKER IMPLANT N/A 08/23/2017   Procedure: PACEMAKER IMPLANT;  Surgeon: [name], MD;  Location: [place] [facility];  Service: Cardiovascular;  Laterality: N/A;  . POLYPECTOMY  11/06/2017   Procedure: POLYPECTOMY;  Surgeon: [name], MD;  Location: [facility] ENDOSCOPY;  Service: Endoscopy;;  . 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 TEE WITHOUT CARDIOVERSION N/A 10/08/2014   Procedure: TRANSESOPHAGEAL ECHOCARDIOGRAM (TEE);  Surgeon: [name], MD;  Location: [facility] ENDOSCOPY;  Service: Cardiovascular;  Laterality: N/A;  . TUBAL LIGATION       Family History  Problem Relation Age of Onset  . Pneumonia Mother   . Heart attack Father   . Diabetes Sister      Social History   Socioeconomic History  . Marital status: Married    Spouse name: Not on file  . Number of children: Not on file  . Years of education: Not on file  . Highest education level: Not on file  Occupational History  . Not on file  Social Needs  . Financial resource strain: Not on file  . Food insecurity    Worry: Not on file    Inability: Not on file  .  Transportation needs    Medical: Not on file    Non-medical: Not on file  Tobacco Use  . Smoking status: Never Smoker  . Smokeless tobacco: Never Used  Substance and Sexual Activity  . Alcohol use: No  . Drug use: No  . Sexual activity: Yes    Birth control/protection: Surgical  Lifestyle  . Physical activity    Days per week: Not on file    Minutes per session: Not on file  . Stress: Not on file  Relationships  . Social Herbalist on phone: Not  on file    Gets together: Not on file    Attends religious service: Not on file    Active member of club or organization: Not on file    Attends meetings of clubs or organizations: Not on file    Relationship status: Not on file  . Intimate partner violence    Fear of current or ex partner: Not on file    Emotionally abused: Not on file    Physically abused: Not on file    Forced sexual activity: Not on file  Other Topics Concern  . Not on file  Social History Narrative  . Not on file     BP (!) 158/70   Pulse 95   Ht 5\' 2"  (1.575 m)   Wt 133 lb (60.3 kg)   SpO2 96%   BMI 24.33 kg/m   Physical Exam:  Well appearing NAD HEENT: Unremarkable Neck:  No JVD, no thyromegally Lymphatics:  No adenopathy Back:  No CVA tenderness Lungs:  Clear HEART:  Regular rate rhythm, no murmurs, no rubs, no clicks Abd:  soft, positive bowel sounds, no organomegally, no rebound, no guarding Ext:  2 plus pulses, no edema, no cyanosis, no clubbing Skin:  No rashes no nodules Neuro:  CN II through XII intact, motor grossly intact   DEVICE  Normal device function.  See PaceArt for details.   Assess/Plan: 1. Atrial fib - her VR is well controlled, s/p PPM insertion. 2. PPM - her medtronic DDD PM is working normally. We will recheck in several months. 3. HTN - her SBP is up today but she notes that it is better at home. She will continue her current meds.  4. Coags - she had bleeding on Eliquis and she is not a candidate for  systemic anti-coagulation.  Mikle Bosworth.D.

## 2018-12-05 ENCOUNTER — Encounter: Payer: Self-pay | Admitting: Cardiology

## 2018-12-05 NOTE — Progress Notes (Signed)
Remote pacemaker transmission.   

## 2018-12-09 ENCOUNTER — Ambulatory Visit: Payer: Medicare Other | Admitting: Family

## 2018-12-09 ENCOUNTER — Ambulatory Visit: Payer: Medicare Other

## 2018-12-09 ENCOUNTER — Other Ambulatory Visit: Payer: Medicare Other

## 2018-12-10 ENCOUNTER — Ambulatory Visit (INDEPENDENT_AMBULATORY_CARE_PROVIDER_SITE_OTHER): Payer: Medicare Other | Admitting: Podiatry

## 2018-12-10 ENCOUNTER — Other Ambulatory Visit: Payer: Self-pay

## 2018-12-10 ENCOUNTER — Encounter: Payer: Self-pay | Admitting: Podiatry

## 2018-12-10 DIAGNOSIS — E119 Type 2 diabetes mellitus without complications: Secondary | ICD-10-CM | POA: Diagnosis not present

## 2018-12-10 DIAGNOSIS — D689 Coagulation defect, unspecified: Secondary | ICD-10-CM

## 2018-12-10 DIAGNOSIS — B351 Tinea unguium: Secondary | ICD-10-CM

## 2018-12-10 DIAGNOSIS — M79676 Pain in unspecified toe(s): Secondary | ICD-10-CM

## 2018-12-10 NOTE — Progress Notes (Signed)
Complaint:  Visit Type: Patient returns to my office for continued preventative foot care services. Complaint: Patient states" my nails have grown long and thick and become painful to walk and wear shoes" Patient has been diagnosed with DM with no foot complications. The patient presents for preventative foot care services. No changes to ROS.  Patient is taking eliquiss.  Podiatric Exam: Vascular: dorsalis pedis and posterior tibial pulses are palpable bilateral. Capillary return is immediate. Temperature gradient is WNL. Skin turgor WNL  Sensorium: Normal Semmes Weinstein monofilament test. Normal tactile sensation bilaterally. Nail Exam: Pt has thick disfigured discolored nails with subungual debris noted bilateral all nails except hallux toes  B/L Ulcer Exam: There is no evidence of ulcer or pre-ulcerative changes or infection. Orthopedic Exam: Muscle tone and strength are WNL. No limitations in general ROM. No crepitus or effusions noted. HAV  B/L.  Hammer toes second left Skin: No Porokeratosis. No infection or ulcers  Diagnosis:  Onychomycosis, , Pain in right toe, pain in left toes  Treatment & Plan Procedures and Treatment: Consent by patient was obtained for treatment procedures. The patient understood the discussion of treatment and procedures well. All questions were answered thoroughly reviewed. Debridement of mycotic and hypertrophic toenails, 1 through 5 bilateral and clearing of subungual debris. No ulceration, no infection noted.  Return Visit-Office Procedure: Patient instructed to return to the office for a follow up visit 3 months for continued evaluation and treatment.    Caressa Scearce DPM 

## 2018-12-11 ENCOUNTER — Ambulatory Visit: Payer: Medicare Other

## 2018-12-11 ENCOUNTER — Ambulatory Visit: Payer: Medicare Other | Admitting: Family

## 2018-12-11 ENCOUNTER — Other Ambulatory Visit: Payer: Medicare Other

## 2018-12-15 ENCOUNTER — Telehealth: Payer: Self-pay | Admitting: Family

## 2018-12-15 ENCOUNTER — Other Ambulatory Visit: Payer: Self-pay

## 2018-12-15 ENCOUNTER — Inpatient Hospital Stay (HOSPITAL_BASED_OUTPATIENT_CLINIC_OR_DEPARTMENT_OTHER): Payer: Medicare Other | Admitting: Family

## 2018-12-15 ENCOUNTER — Inpatient Hospital Stay: Payer: Medicare Other

## 2018-12-15 ENCOUNTER — Inpatient Hospital Stay: Payer: Medicare Other | Attending: Hematology & Oncology

## 2018-12-15 DIAGNOSIS — Z79899 Other long term (current) drug therapy: Secondary | ICD-10-CM | POA: Diagnosis not present

## 2018-12-15 DIAGNOSIS — D631 Anemia in chronic kidney disease: Secondary | ICD-10-CM

## 2018-12-15 DIAGNOSIS — K909 Intestinal malabsorption, unspecified: Secondary | ICD-10-CM | POA: Diagnosis not present

## 2018-12-15 DIAGNOSIS — D500 Iron deficiency anemia secondary to blood loss (chronic): Secondary | ICD-10-CM | POA: Diagnosis not present

## 2018-12-15 LAB — RETICULOCYTES
Immature Retic Fract: 9.8 % (ref 2.3–15.9)
RBC.: 4.13 MIL/uL (ref 3.87–5.11)
Retic Count, Absolute: 38 10*3/uL (ref 19.0–186.0)
Retic Ct Pct: 0.9 % (ref 0.4–3.1)

## 2018-12-15 LAB — CBC WITH DIFFERENTIAL (CANCER CENTER ONLY)
Abs Immature Granulocytes: 0.03 10*3/uL (ref 0.00–0.07)
Basophils Absolute: 0.1 10*3/uL (ref 0.0–0.1)
Basophils Relative: 1 %
Eosinophils Absolute: 0.1 10*3/uL (ref 0.0–0.5)
Eosinophils Relative: 1 %
HCT: 35.5 % — ABNORMAL LOW (ref 36.0–46.0)
Hemoglobin: 11.2 g/dL — ABNORMAL LOW (ref 12.0–15.0)
Immature Granulocytes: 0 %
Lymphocytes Relative: 21 %
Lymphs Abs: 1.8 10*3/uL (ref 0.7–4.0)
MCH: 27.7 pg (ref 26.0–34.0)
MCHC: 31.5 g/dL (ref 30.0–36.0)
MCV: 87.7 fL (ref 80.0–100.0)
Monocytes Absolute: 0.9 10*3/uL (ref 0.1–1.0)
Monocytes Relative: 11 %
Neutro Abs: 5.6 10*3/uL (ref 1.7–7.7)
Neutrophils Relative %: 66 %
Platelet Count: 187 10*3/uL (ref 150–400)
RBC: 4.05 MIL/uL (ref 3.87–5.11)
RDW: 14.8 % (ref 11.5–15.5)
WBC Count: 8.5 10*3/uL (ref 4.0–10.5)
nRBC: 0 % (ref 0.0–0.2)

## 2018-12-15 LAB — CMP (CANCER CENTER ONLY)
ALT: 14 U/L (ref 0–44)
AST: 21 U/L (ref 15–41)
Albumin: 4.5 g/dL (ref 3.5–5.0)
Alkaline Phosphatase: 69 U/L (ref 38–126)
Anion gap: 11 (ref 5–15)
BUN: 47 mg/dL — ABNORMAL HIGH (ref 8–23)
CO2: 28 mmol/L (ref 22–32)
Calcium: 10.6 mg/dL — ABNORMAL HIGH (ref 8.9–10.3)
Chloride: 97 mmol/L — ABNORMAL LOW (ref 98–111)
Creatinine: 1.78 mg/dL — ABNORMAL HIGH (ref 0.44–1.00)
GFR, Est AFR Am: 30 mL/min — ABNORMAL LOW (ref >60)
GFR, Estimated: 26 mL/min — ABNORMAL LOW (ref >60)
Glucose, Bld: 152 mg/dL — ABNORMAL HIGH (ref 70–99)
Potassium: 4.6 mmol/L (ref 3.5–5.1)
Sodium: 136 mmol/L (ref 135–145)
Total Bilirubin: 0.5 mg/dL (ref 0.3–1.2)
Total Protein: 7.8 g/dL (ref 6.5–8.1)

## 2018-12-15 NOTE — Telephone Encounter (Signed)
Appointments scheduled calendar printed per 9/14 los 

## 2018-12-15 NOTE — Progress Notes (Signed)
Hematology and Oncology Follow Up Visit  Ann Fletcher [id number] May 29, 1932 85 y.o. 12/15/2018   Principle Diagnosis:  Iron deficiency anemia -- iron malabsorption and chronic blood loss Erythropoitin deficiencyanemia  Current Therapy:   IV iron as indicated Aranesp 300 mcg sq q 3 week for Hgb < 11   Interim History:  Ann Fletcher is here today for follow-up. She is doing well but states that she had one episode where her stool was red. She has history of internal hemorrhoids. Hgb today is stable at 11.2, MCV 87.7.  She denies pain, constipation or diarrhea.  No other episodes of bleeding. No bruising or petechiae.  She takes 1 baby aspirin daily.  She has occasional SOB with over exertion and will take a break to rest when needed.  No fever, chills, n/v, cough, rash, dizziness, chest pain, palpitations, abdominal pain or changes in bowel or bladder habits.  No swelling, tenderness, numbness or tingling in her extremities.  She uses her walker when ambulating for support. No falls or syncopal episodes.  She is eating well and staying hydrated. Her weight is stable.   ECOG Performance Status: 1 - Symptomatic but completely ambulatory  Medications:  Allergies as of 12/15/2018      Reactions   Pradaxa [dabigatran Etexilate Mesylate] Nausea And Vomiting   Nsaids Other (See Comments), Itching   Stomach cramps Stomach cramps   Sulfa Antibiotics Nausea And Vomiting, Other (See Comments)   GI upset   Tizanidine Hcl Other (See Comments)   Dizziness, patient reports fall   Betadine [povidone Iodine] Itching   Povidone-iodine Rash      Medication List       Accurate as of December 15, 2018  2:12 PM. If you have any questions, ask your nurse or doctor.        albuterol 108 (90 Base) MCG/ACT inhaler Commonly known as: VENTOLIN HFA Inhale 2 puffs into the lungs 3 (three) times daily as needed for wheezing or shortness of breath.   aspirin 81 MG chewable tablet Chew 81 mg by  mouth daily.   atorvastatin 40 MG tablet Commonly known as: LIPITOR Take 40 mg by mouth at bedtime.   benzonatate 100 MG capsule Commonly known as: Tessalon Perles Take 1 capsule (100 mg total) by mouth 3 (three) times daily as needed for cough.   fluticasone 50 MCG/ACT nasal spray Commonly known as: FLONASE Place 1 spray into both nostrils daily as needed for allergies.   furosemide 40 MG tablet Commonly known as: LASIX Take 2 tablets (80 mg total) by mouth every morning AND 1 tablet (40 mg total) 2 (two) times daily at 10 am and 4 pm.   furosemide 80 MG tablet Commonly known as: LASIX TAKE ONE WHOLE TABLET (80MG ) IN THE MORNING AND HALF TABLET (40 MG) IN THE EVENING.   levothyroxine 100 MCG tablet Commonly known as: SYNTHROID Take 100 mcg by mouth daily before breakfast.   LORazepam 1 MG tablet Commonly known as: ATIVAN Take 1 mg by mouth 3 (three) times daily as needed for anxiety.   metFORMIN 500 MG 24 hr tablet Commonly known as: GLUCOPHAGE-XR Take 1,000 mg by mouth 2 (two) times daily.   OSCAL 500/200 D-3 PO Take 1 tablet by mouth daily.   pantoprazole 40 MG tablet Commonly known as: PROTONIX Take 1 tablet (40 mg total) by mouth daily.   potassium chloride SA 20 MEQ tablet Commonly known as: K-DUR Take 1 tablet (20 mEq total) by mouth daily.  traMADol 50 MG tablet Commonly known as: ULTRAM Take 50 mg by mouth every 8 (eight) hours as needed for moderate pain.   vitamin E 400 UNIT capsule Generic drug: vitamin E Take 400 Units by mouth daily.       Allergies:  Allergies  Allergen Reactions  . Pradaxa [Dabigatran Etexilate Mesylate] Nausea And Vomiting  . Nsaids Other (See Comments) and Itching    Stomach cramps Stomach cramps  . Sulfa Antibiotics Nausea And Vomiting and Other (See Comments)    GI upset  . Tizanidine Hcl Other (See Comments)    Dizziness, patient reports fall  . Betadine [Povidone Iodine] Itching  . Povidone-Iodine Rash     Past Medical History, Surgical history, Social history, and Family History were reviewed and updated.  Review of Systems: All other 10 point review of systems is negative.   Physical Exam:  vitals were not taken for this visit.   Wt Readings from Last 3 Encounters:  12/02/18 133 lb (60.3 kg)  11/06/18 136 lb 1.9 oz (61.7 kg)  10/09/18 139 lb (63 kg)    Ocular: Sclerae unicteric, pupils equal, round and reactive to light Ear-nose-throat: Oropharynx clear, dentition fair Lymphatic: No cervical or supraclavicular adenopathy Lungs no rales or rhonchi, good excursion bilaterally Heart regular rate and rhythm, no murmur appreciated Abd soft, nontender, positive bowel sounds, no liver or spleen tip palpated on exam, no fluid wave  MSK no focal spinal tenderness, no joint edema Neuro: non-focal, well-oriented, appropriate affect Breasts: Deferred   Lab Results  Component Value Date   WBC 8.5 12/15/2018   HGB 11.2 (L) 12/15/2018   HCT 35.5 (L) 12/15/2018   MCV 87.7 12/15/2018   PLT 187 12/15/2018   Lab Results  Component Value Date   FERRITIN 512 (H) 11/06/2018   IRON 54 11/06/2018   TIBC 265 11/06/2018   UIBC 212 11/06/2018   IRONPCTSAT 20 (L) 11/06/2018   Lab Results  Component Value Date   RETICCTPCT 0.9 12/15/2018   RBC 4.13 12/15/2018   No results found for: KPAFRELGTCHN, LAMBDASER, KAPLAMBRATIO No results found for: IGGSERUM, IGA, IGMSERUM No results found for: Odetta Pink, SPEI   Chemistry      Component Value Date/Time   NA 138 11/06/2018 1514   NA 138 06/09/2018 1407   K 5.5 (H) 11/06/2018 1514   CL 101 11/06/2018 1514   CO2 28 11/06/2018 1514   BUN 41 (H) 11/06/2018 1514   BUN 37 (H) 06/09/2018 1407   CREATININE 1.38 (H) 11/06/2018 1514   CREATININE 1.15 (H) 10/29/2014 1608      Component Value Date/Time   CALCIUM 10.5 (H) 11/06/2018 1514   ALKPHOS 68 11/06/2018 1514   AST 26 11/06/2018 1514    ALT 19 11/06/2018 1514   BILITOT 0.4 11/06/2018 1514       Impression and Plan: Ann Fletcher is a very pleasant 85 yo caucasian female with both iron deficiency and erythropoietin deficiency anemias.  No Aranesp needed this visit with Hgb 11.2.  She will let us know if she has another episode of blood in her stool.  We will see what her iron studies show and bring her back in for infusion if needed.  We will plan to see her back in another month.  She will contact our office with any questions or concerns. We can certainly see her sooner if needed.   [name], NP 9/14/20202:12 PM

## 2018-12-16 LAB — IRON AND TIBC
Iron: 62 ug/dL (ref 41–142)
Saturation Ratios: 23 % (ref 21–57)
TIBC: 269 ug/dL (ref 236–444)
UIBC: 207 ug/dL (ref 120–384)

## 2018-12-16 LAB — FERRITIN: Ferritin: 635 ng/mL — ABNORMAL HIGH (ref 11–307)

## 2019-01-13 DIAGNOSIS — Z23 Encounter for immunization: Secondary | ICD-10-CM | POA: Diagnosis not present

## 2019-01-14 ENCOUNTER — Inpatient Hospital Stay: Payer: Medicare Other

## 2019-01-14 ENCOUNTER — Inpatient Hospital Stay: Payer: Medicare Other | Attending: Hematology & Oncology

## 2019-01-14 ENCOUNTER — Other Ambulatory Visit: Payer: Self-pay

## 2019-01-14 ENCOUNTER — Inpatient Hospital Stay (HOSPITAL_BASED_OUTPATIENT_CLINIC_OR_DEPARTMENT_OTHER): Payer: Medicare Other | Admitting: Family

## 2019-01-14 ENCOUNTER — Telehealth: Payer: Self-pay | Admitting: Family

## 2019-01-14 VITALS — BP 157/50 | HR 83 | Temp 97.1°F | Resp 19 | Wt 138.0 lb

## 2019-01-14 DIAGNOSIS — Z79899 Other long term (current) drug therapy: Secondary | ICD-10-CM | POA: Diagnosis not present

## 2019-01-14 DIAGNOSIS — K909 Intestinal malabsorption, unspecified: Secondary | ICD-10-CM | POA: Insufficient documentation

## 2019-01-14 DIAGNOSIS — D631 Anemia in chronic kidney disease: Secondary | ICD-10-CM | POA: Diagnosis not present

## 2019-01-14 DIAGNOSIS — D500 Iron deficiency anemia secondary to blood loss (chronic): Secondary | ICD-10-CM | POA: Diagnosis not present

## 2019-01-14 DIAGNOSIS — D508 Other iron deficiency anemias: Secondary | ICD-10-CM | POA: Insufficient documentation

## 2019-01-14 DIAGNOSIS — Z7982 Long term (current) use of aspirin: Secondary | ICD-10-CM | POA: Diagnosis not present

## 2019-01-14 DIAGNOSIS — Z7984 Long term (current) use of oral hypoglycemic drugs: Secondary | ICD-10-CM | POA: Diagnosis not present

## 2019-01-14 LAB — CMP (CANCER CENTER ONLY)
ALT: 14 U/L (ref 0–44)
AST: 24 U/L (ref 15–41)
Albumin: 4.7 g/dL (ref 3.5–5.0)
Alkaline Phosphatase: 59 U/L (ref 38–126)
Anion gap: 11 (ref 5–15)
BUN: 59 mg/dL — ABNORMAL HIGH (ref 8–23)
CO2: 30 mmol/L (ref 22–32)
Calcium: 11.1 mg/dL — ABNORMAL HIGH (ref 8.9–10.3)
Chloride: 99 mmol/L (ref 98–111)
Creatinine: 1.91 mg/dL — ABNORMAL HIGH (ref 0.44–1.00)
GFR, Est AFR Am: 27 mL/min — ABNORMAL LOW (ref >60)
GFR, Estimated: 24 mL/min — ABNORMAL LOW (ref >60)
Glucose, Bld: 109 mg/dL — ABNORMAL HIGH (ref 70–99)
Potassium: 5.9 mmol/L — ABNORMAL HIGH (ref 3.5–5.1)
Sodium: 140 mmol/L (ref 135–145)
Total Bilirubin: 0.6 mg/dL (ref 0.3–1.2)
Total Protein: 8.1 g/dL (ref 6.5–8.1)

## 2019-01-14 LAB — RETICULOCYTES
Immature Retic Fract: 9.7 % (ref 2.3–15.9)
RBC.: 3.9 MIL/uL (ref 3.87–5.11)
Retic Count, Absolute: 44.1 10*3/uL (ref 19.0–186.0)
Retic Ct Pct: 1.1 % (ref 0.4–3.1)

## 2019-01-14 LAB — CBC WITH DIFFERENTIAL (CANCER CENTER ONLY)
Abs Immature Granulocytes: 0.03 10*3/uL (ref 0.00–0.07)
Basophils Absolute: 0 10*3/uL (ref 0.0–0.1)
Basophils Relative: 0 %
Eosinophils Absolute: 0.1 10*3/uL (ref 0.0–0.5)
Eosinophils Relative: 1 %
HCT: 34.5 % — ABNORMAL LOW (ref 36.0–46.0)
Hemoglobin: 11 g/dL — ABNORMAL LOW (ref 12.0–15.0)
Immature Granulocytes: 0 %
Lymphocytes Relative: 23 %
Lymphs Abs: 2 10*3/uL (ref 0.7–4.0)
MCH: 28.1 pg (ref 26.0–34.0)
MCHC: 31.9 g/dL (ref 30.0–36.0)
MCV: 88.2 fL (ref 80.0–100.0)
Monocytes Absolute: 0.8 10*3/uL (ref 0.1–1.0)
Monocytes Relative: 9 %
Neutro Abs: 6 10*3/uL (ref 1.7–7.7)
Neutrophils Relative %: 67 %
Platelet Count: 178 10*3/uL (ref 150–400)
RBC: 3.91 MIL/uL (ref 3.87–5.11)
RDW: 14.4 % (ref 11.5–15.5)
WBC Count: 8.9 10*3/uL (ref 4.0–10.5)
nRBC: 0 % (ref 0.0–0.2)

## 2019-01-14 NOTE — Progress Notes (Signed)
Hematology and Oncology Follow Up Visit  Ann Fletcher [id number] 21-Oct-1932 85 y.o. 01/14/2019   Principle Diagnosis:  Iron deficiency anemia -- iron malabsorption and chronic blood loss Erythropoitin deficiencyanemia  Current Therapy:   IV iron as indicated Aranesp 300 mcg sq q 3 week for Hgb < 11   Interim History:  Ann Fletcher is here today for follow-up. She is doing quite well but will occasionally have some fatigue.  No episodes of bleeding. No bruising or petechiae.  No fever, chills, cough, rash, dizziness, SOB, chest pain, palpitations, abdominal pain or changes in bowel or bladder habits.  She got a little nauseated after eating greasy bacon but did not vomit. No other episodes.  No swelling, tenderness, numbness or tingling in her extremities.  She ambulates with her walking/seat walker for support.  No falls or syncopal episodes to report.  She has maintained a good appetite and is staying well hydrated. Her weight is stable.   ECOG Performance Status: 1 - Symptomatic but completely ambulatory  Medications:  Allergies as of 01/14/2019      Reactions   Pradaxa [dabigatran Etexilate Mesylate] Nausea And Vomiting   Nsaids Other (See Comments), Itching   Stomach cramps Stomach cramps   Sulfa Antibiotics Nausea And Vomiting, Other (See Comments)   GI upset   Tizanidine Hcl Other (See Comments)   Dizziness, patient reports fall   Betadine [povidone Iodine] Itching   Povidone-iodine Rash      Medication List       Accurate as of January 14, 2019  2:01 PM. If you have any questions, ask your nurse or doctor.        albuterol 108 (90 Base) MCG/ACT inhaler Commonly known as: VENTOLIN HFA Inhale 2 puffs into the lungs 3 (three) times daily as needed for wheezing or shortness of breath.   aspirin 81 MG chewable tablet Chew 81 mg by mouth daily.   atorvastatin 40 MG tablet Commonly known as: LIPITOR Take 40 mg by mouth at bedtime.   benzonatate 100 MG  capsule Commonly known as: Tessalon Perles Take 1 capsule (100 mg total) by mouth 3 (three) times daily as needed for cough.   fluticasone 50 MCG/ACT nasal spray Commonly known as: FLONASE Place 1 spray into both nostrils daily as needed for allergies.   furosemide 40 MG tablet Commonly known as: LASIX Take 2 tablets (80 mg total) by mouth every morning AND 1 tablet (40 mg total) 2 (two) times daily at 10 am and 4 pm.   furosemide 80 MG tablet Commonly known as: LASIX TAKE ONE WHOLE TABLET (80MG ) IN THE MORNING AND HALF TABLET (40 MG) IN THE EVENING.   levothyroxine 100 MCG tablet Commonly known as: SYNTHROID Take 100 mcg by mouth daily before breakfast.   LORazepam 1 MG tablet Commonly known as: ATIVAN Take 1 mg by mouth 3 (three) times daily as needed for anxiety.   metFORMIN 500 MG 24 hr tablet Commonly known as: GLUCOPHAGE-XR Take 1,000 mg by mouth 2 (two) times daily.   OSCAL 500/200 D-3 PO Take 1 tablet by mouth daily.   pantoprazole 40 MG tablet Commonly known as: PROTONIX Take 1 tablet (40 mg total) by mouth daily.   potassium chloride SA 20 MEQ tablet Commonly known as: KLOR-CON Take 1 tablet (20 mEq total) by mouth daily.   traMADol 50 MG tablet Commonly known as: ULTRAM Take 50 mg by mouth every 8 (eight) hours as needed for moderate pain.   vitamin E 400  UNIT capsule Generic drug: vitamin E Take 400 Units by mouth daily.       Allergies:  Allergies  Allergen Reactions  . Pradaxa [Dabigatran Etexilate Mesylate] Nausea And Vomiting  . Nsaids Other (See Comments) and Itching    Stomach cramps Stomach cramps  . Sulfa Antibiotics Nausea And Vomiting and Other (See Comments)    GI upset  . Tizanidine Hcl Other (See Comments)    Dizziness, patient reports fall  . Betadine [Povidone Iodine] Itching  . Povidone-Iodine Rash    Past Medical History, Surgical history, Social history, and Family History were reviewed and updated.  Review of Systems:  All other 10 point review of systems is negative.   Physical Exam:  vitals were not taken for this visit.   Wt Readings from Last 3 Encounters:  12/02/18 133 lb (60.3 kg)  11/06/18 136 lb 1.9 oz (61.7 kg)  10/09/18 139 lb (63 kg)    Ocular: Sclerae unicteric, pupils equal, round and reactive to light Ear-nose-throat: Oropharynx clear, dentition fair Lymphatic: No cervical or supraclavicular adenopathy Lungs no rales or rhonchi, good excursion bilaterally Heart regular rate and rhythm, no murmur appreciated Abd soft, nontender, positive bowel sounds, no liver or spleen tip palpated on exam, no fluid wave  MSK no focal spinal tenderness, no joint edema Neuro: non-focal, well-oriented, appropriate affect Breasts: Deferred   Lab Results  Component Value Date   WBC 8.9 01/14/2019   HGB 11.0 (L) 01/14/2019   HCT 34.5 (L) 01/14/2019   MCV 88.2 01/14/2019   PLT 178 01/14/2019   Lab Results  Component Value Date   FERRITIN 635 (H) 12/15/2018   IRON 62 12/15/2018   TIBC 269 12/15/2018   UIBC 207 12/15/2018   IRONPCTSAT 23 12/15/2018   Lab Results  Component Value Date   RETICCTPCT 1.1 01/14/2019   RBC 3.90 01/14/2019   No results found for: KPAFRELGTCHN, LAMBDASER, KAPLAMBRATIO No results found for: IGGSERUM, IGA, IGMSERUM No results found for: Ronnald Ramp, A1GS, A2GS, Violet Baldy, MSPIKE, SPEI   Chemistry      Component Value Date/Time   NA 136 12/15/2018 1347   NA 138 06/09/2018 1407   K 4.6 12/15/2018 1347   CL 97 (L) 12/15/2018 1347   CO2 28 12/15/2018 1347   BUN 47 (H) 12/15/2018 1347   BUN 37 (H) 06/09/2018 1407   CREATININE 1.78 (H) 12/15/2018 1347   CREATININE 1.15 (H) 10/29/2014 1608      Component Value Date/Time   CALCIUM 10.6 (H) 12/15/2018 1347   ALKPHOS 69 12/15/2018 1347   AST 21 12/15/2018 1347   ALT 14 12/15/2018 1347   BILITOT 0.5 12/15/2018 1347       Impression and Plan: Ann Fletcher is a very pleasant 85 yo caucasian  female with both iron deficiency and erythropoietin deficiency anemias. Hgb is stable at 11.0. No Aranesp needed at this time.  We will see what her iron studies show and bring her back in for infusion if needed.  She will contact our office with any questions or concerns. We can certainly see her sooner if needed.   [name], NP 10/14/20202:01 PM

## 2019-01-14 NOTE — Telephone Encounter (Signed)
Appointments scheduled date/time is ok / per 10/15 los

## 2019-01-15 LAB — FERRITIN: Ferritin: 493 ng/mL — ABNORMAL HIGH (ref 11–307)

## 2019-01-15 LAB — IRON AND TIBC
Iron: 47 ug/dL (ref 41–142)
Saturation Ratios: 17 % — ABNORMAL LOW (ref 21–57)
TIBC: 281 ug/dL (ref 236–444)
UIBC: 234 ug/dL (ref 120–384)

## 2019-01-19 ENCOUNTER — Other Ambulatory Visit: Payer: Self-pay

## 2019-01-19 ENCOUNTER — Inpatient Hospital Stay: Payer: Medicare Other

## 2019-01-19 VITALS — BP 155/75 | HR 70 | Temp 97.1°F | Resp 18

## 2019-01-19 DIAGNOSIS — D508 Other iron deficiency anemias: Secondary | ICD-10-CM | POA: Diagnosis not present

## 2019-01-19 DIAGNOSIS — Z7982 Long term (current) use of aspirin: Secondary | ICD-10-CM | POA: Diagnosis not present

## 2019-01-19 DIAGNOSIS — D500 Iron deficiency anemia secondary to blood loss (chronic): Secondary | ICD-10-CM

## 2019-01-19 DIAGNOSIS — Z7984 Long term (current) use of oral hypoglycemic drugs: Secondary | ICD-10-CM | POA: Diagnosis not present

## 2019-01-19 DIAGNOSIS — K909 Intestinal malabsorption, unspecified: Secondary | ICD-10-CM | POA: Diagnosis not present

## 2019-01-19 DIAGNOSIS — Z79899 Other long term (current) drug therapy: Secondary | ICD-10-CM | POA: Diagnosis not present

## 2019-01-19 MED ORDER — SODIUM CHLORIDE 0.9 % IV SOLN
200.0000 mg | Freq: Once | INTRAVENOUS | Status: AC
  Administered 2019-01-19: 200 mg via INTRAVENOUS
  Filled 2019-01-19: qty 10

## 2019-01-19 MED ORDER — SODIUM CHLORIDE 0.9 % IV SOLN
Freq: Once | INTRAVENOUS | Status: AC
  Administered 2019-01-19: 13:00:00 via INTRAVENOUS
  Filled 2019-01-19: qty 250

## 2019-01-19 NOTE — Progress Notes (Signed)
Patient does not want to stay for the 30 minute post IV iron observation. Patient discharged ambulatory with assistance of a walker and accompanied by a staff member.

## 2019-01-19 NOTE — Patient Instructions (Signed)
Iron Sucrose injection (Venofer)  What is this medicine? IRON SUCROSE (AHY ern SOO krohs) is an iron complex. Iron is used to make healthy red blood cells, which carry oxygen and nutrients throughout the body. This medicine is used to treat iron deficiency anemia in people with chronic kidney disease. This medicine may be used for other purposes; ask your health care provider or pharmacist if you have questions. COMMON BRAND NAME(S): Venofer What should I tell my health care provider before I take this medicine? They need to know if you have any of these conditions:  anemia not caused by low iron levels  heart disease  high levels of iron in the blood  kidney disease  liver disease  an unusual or allergic reaction to iron, other medicines, foods, dyes, or preservatives  pregnant or trying to get pregnant  breast-feeding How should I use this medicine? This medicine is for infusion into a vein. It is given by a health care professional in a hospital or clinic setting. Talk to your pediatrician regarding the use of this medicine in children. While this drug may be prescribed for children as young as 2 years for selected conditions, precautions do apply. Overdosage: If you think you have taken too much of this medicine contact a poison control center or emergency room at once. NOTE: This medicine is only for you. Do not share this medicine with others. What if I miss a dose? It is important not to miss your dose. Call your doctor or health care professional if you are unable to keep an appointment. What may interact with this medicine? Do not take this medicine with any of the following medications:  deferoxamine  dimercaprol  other iron products This medicine may also interact with the following medications:  chloramphenicol  deferasirox This list may not describe all possible interactions. Give your health care provider a list of all the medicines, herbs, non-prescription  drugs, or dietary supplements you use. Also tell them if you smoke, drink alcohol, or use illegal drugs. Some items may interact with your medicine. What should I watch for while using this medicine? Visit your doctor or healthcare professional regularly. Tell your doctor or healthcare professional if your symptoms do not start to get better or if they get worse. You may need blood work done while you are taking this medicine. You may need to follow a special diet. Talk to your doctor. Foods that contain iron include: whole grains/cereals, dried fruits, beans, or peas, leafy green vegetables, and organ meats (liver, kidney). What side effects may I notice from receiving this medicine? Side effects that you should report to your doctor or health care professional as soon as possible:  allergic reactions like skin rash, itching or hives, swelling of the face, lips, or tongue  breathing problems  changes in blood pressure  cough  fast, irregular heartbeat  feeling faint or lightheaded, falls  fever or chills  flushing, sweating, or hot feelings  joint or muscle aches/pains  seizures  swelling of the ankles or feet  unusually weak or tired Side effects that usually do not require medical attention (report to your doctor or health care professional if they continue or are bothersome):  diarrhea  feeling achy  headache  irritation at site where injected  nausea, vomiting  stomach upset  tiredness This list may not describe all possible side effects. Call your doctor for medical advice about side effects. You may report side effects to FDA at 1-800-FDA-1088. Where should   I keep my medicine? This drug is given in a hospital or clinic and will not be stored at home. NOTE: This sheet is a summary. It may not cover all possible information. If you have questions about this medicine, talk to your doctor, pharmacist, or health care provider.  2020 Elsevier/Gold Standard  (2010-12-28 17:14:35)  

## 2019-01-20 DIAGNOSIS — E1165 Type 2 diabetes mellitus with hyperglycemia: Secondary | ICD-10-CM | POA: Diagnosis not present

## 2019-01-20 DIAGNOSIS — E1121 Type 2 diabetes mellitus with diabetic nephropathy: Secondary | ICD-10-CM | POA: Diagnosis not present

## 2019-01-20 DIAGNOSIS — I130 Hypertensive heart and chronic kidney disease with heart failure and stage 1 through stage 4 chronic kidney disease, or unspecified chronic kidney disease: Secondary | ICD-10-CM | POA: Diagnosis not present

## 2019-01-26 ENCOUNTER — Inpatient Hospital Stay: Payer: Medicare Other

## 2019-01-26 ENCOUNTER — Other Ambulatory Visit: Payer: Self-pay

## 2019-01-26 VITALS — BP 175/60 | HR 78 | Temp 98.1°F | Resp 17

## 2019-01-26 DIAGNOSIS — Z7984 Long term (current) use of oral hypoglycemic drugs: Secondary | ICD-10-CM | POA: Diagnosis not present

## 2019-01-26 DIAGNOSIS — D500 Iron deficiency anemia secondary to blood loss (chronic): Secondary | ICD-10-CM

## 2019-01-26 DIAGNOSIS — Z7982 Long term (current) use of aspirin: Secondary | ICD-10-CM | POA: Diagnosis not present

## 2019-01-26 DIAGNOSIS — D508 Other iron deficiency anemias: Secondary | ICD-10-CM | POA: Diagnosis not present

## 2019-01-26 DIAGNOSIS — K909 Intestinal malabsorption, unspecified: Secondary | ICD-10-CM | POA: Diagnosis not present

## 2019-01-26 DIAGNOSIS — Z79899 Other long term (current) drug therapy: Secondary | ICD-10-CM | POA: Diagnosis not present

## 2019-01-26 MED ORDER — SODIUM CHLORIDE 0.9 % IV SOLN
Freq: Once | INTRAVENOUS | Status: AC
  Administered 2019-01-26: 13:00:00 via INTRAVENOUS
  Filled 2019-01-26: qty 250

## 2019-01-26 MED ORDER — SODIUM CHLORIDE 0.9 % IV SOLN
200.0000 mg | Freq: Once | INTRAVENOUS | Status: AC
  Administered 2019-01-26: 13:00:00 via INTRAVENOUS
  Filled 2019-01-26: qty 10

## 2019-01-26 NOTE — Patient Instructions (Signed)

## 2019-01-27 DIAGNOSIS — I130 Hypertensive heart and chronic kidney disease with heart failure and stage 1 through stage 4 chronic kidney disease, or unspecified chronic kidney disease: Secondary | ICD-10-CM | POA: Diagnosis not present

## 2019-01-27 DIAGNOSIS — I5032 Chronic diastolic (congestive) heart failure: Secondary | ICD-10-CM | POA: Diagnosis not present

## 2019-01-27 DIAGNOSIS — E1165 Type 2 diabetes mellitus with hyperglycemia: Secondary | ICD-10-CM | POA: Diagnosis not present

## 2019-01-27 DIAGNOSIS — E1121 Type 2 diabetes mellitus with diabetic nephropathy: Secondary | ICD-10-CM | POA: Diagnosis not present

## 2019-03-03 ENCOUNTER — Ambulatory Visit (INDEPENDENT_AMBULATORY_CARE_PROVIDER_SITE_OTHER): Payer: Medicare Other | Admitting: *Deleted

## 2019-03-03 DIAGNOSIS — Z950 Presence of cardiac pacemaker: Secondary | ICD-10-CM | POA: Diagnosis not present

## 2019-03-03 LAB — CUP PACEART REMOTE DEVICE CHECK
Battery Remaining Longevity: 123 mo
Battery Voltage: 3.02 V
Brady Statistic AP VP Percent: 0 %
Brady Statistic AP VS Percent: 0 %
Brady Statistic AS VP Percent: 99.96 %
Brady Statistic AS VS Percent: 0.04 %
Brady Statistic RA Percent Paced: 0 %
Brady Statistic RV Percent Paced: 99.96 %
Date Time Interrogation Session: 20201130233550
Implantable Lead Implant Date: 20190524
Implantable Lead Implant Date: 20190524
Implantable Lead Location: 753859
Implantable Lead Location: 753860
Implantable Lead Model: 3830
Implantable Lead Model: 5076
Implantable Pulse Generator Implant Date: 20190524
Lead Channel Impedance Value: 342 Ohm
Lead Channel Impedance Value: 361 Ohm
Lead Channel Impedance Value: 475 Ohm
Lead Channel Impedance Value: 513 Ohm
Lead Channel Pacing Threshold Amplitude: 0.875 V
Lead Channel Pacing Threshold Pulse Width: 0.4 ms
Lead Channel Sensing Intrinsic Amplitude: 1.5 mV
Lead Channel Sensing Intrinsic Amplitude: 9.875 mV
Lead Channel Sensing Intrinsic Amplitude: 9.875 mV
Lead Channel Setting Pacing Amplitude: 2.5 V
Lead Channel Setting Pacing Pulse Width: 0.4 ms
Lead Channel Setting Sensing Sensitivity: 4 mV

## 2019-03-04 ENCOUNTER — Ambulatory Visit: Payer: Medicare Other | Admitting: Cardiovascular Disease

## 2019-03-06 ENCOUNTER — Ambulatory Visit: Payer: Medicare Other | Admitting: Cardiovascular Disease

## 2019-03-11 ENCOUNTER — Ambulatory Visit (INDEPENDENT_AMBULATORY_CARE_PROVIDER_SITE_OTHER): Payer: Medicare Other | Admitting: Podiatry

## 2019-03-11 ENCOUNTER — Other Ambulatory Visit: Payer: Self-pay

## 2019-03-11 ENCOUNTER — Encounter: Payer: Self-pay | Admitting: Cardiovascular Disease

## 2019-03-11 ENCOUNTER — Ambulatory Visit (INDEPENDENT_AMBULATORY_CARE_PROVIDER_SITE_OTHER): Payer: Medicare Other | Admitting: Cardiovascular Disease

## 2019-03-11 ENCOUNTER — Encounter: Payer: Self-pay | Admitting: Podiatry

## 2019-03-11 DIAGNOSIS — E119 Type 2 diabetes mellitus without complications: Secondary | ICD-10-CM

## 2019-03-11 DIAGNOSIS — M79676 Pain in unspecified toe(s): Secondary | ICD-10-CM

## 2019-03-11 DIAGNOSIS — B351 Tinea unguium: Secondary | ICD-10-CM | POA: Diagnosis not present

## 2019-03-11 DIAGNOSIS — E782 Mixed hyperlipidemia: Secondary | ICD-10-CM | POA: Diagnosis not present

## 2019-03-11 DIAGNOSIS — R600 Localized edema: Secondary | ICD-10-CM

## 2019-03-11 DIAGNOSIS — I5032 Chronic diastolic (congestive) heart failure: Secondary | ICD-10-CM

## 2019-03-11 DIAGNOSIS — D689 Coagulation defect, unspecified: Secondary | ICD-10-CM | POA: Diagnosis not present

## 2019-03-11 DIAGNOSIS — I10 Essential (primary) hypertension: Secondary | ICD-10-CM

## 2019-03-11 DIAGNOSIS — I4891 Unspecified atrial fibrillation: Secondary | ICD-10-CM | POA: Diagnosis not present

## 2019-03-11 MED ORDER — FUROSEMIDE 40 MG PO TABS: 40.0000 mg | ORAL_TABLET | Freq: Every day | ORAL | 3 refills | Status: DC

## 2019-03-11 NOTE — Progress Notes (Signed)
03/11/2019 YOUNG BRIM   [birth year]  [id number]  Primary Physician [name], MD Primary Cardiologist: Lorretta Harp MD [name], [place]  HPI:  Ann Fletcher is a 85 y.o.  moderately overweight married Caucasian female whose husbandAddisonis also a patient of mine as well and he accompanies her today.. She is a mother of 3 and grandmother and 5 grandchildren. She was referred by Dr. Ashby Dawes and for preoperative clearance before elective back surgery. I last saw her in the office 03/11/2018.Marland KitchenMarland KitchenHer cardiac risk factor profile is notable for treated hypertension, diabetes and hyperlipidemia. She does have bilateral looks to me edema. There is no family history of heart disease. She's never had a heart attack or stroke. She denies chest pain or shortness of breath. She apparently has back issues which may require surgery and was referred here for cardiovascular surgical clearance. She was hospitalized at [facility] from July 7 through the 15th with A. Fib with RVR and m edema. She was diuresed, loaded with amiodarone and ultimately cardioverted by Dr. Marlou Porch successfully after 3 shocks which ultimately did not hold. She saw edema and time PA-C in the office 10/29/14 in follow-up. Her heart rate at that time was 100. Today she feels somewhat weak and nauseated. Her EKG shows junctional bradycardia 40. She is on Eliquis . I'mdecreased her amiodarone and referred her to the [name] where she was seen by Roderic Palau nurse practitioner.   She was on amiodarone which was discontinued because of pulmonary fibrosis.  She had continued A. fib with RVR and ultimately underwent AV nodal ablation by Dr. Crissie Sickles 08/23/2017 with implantation of a permanent transvenous pacemaker.  Her symptoms improved.  She was on Eliquis which was discontinued because of GI bleed and persistent anemia.  She also has had diastolic heart failure and has been on oral diuretics which  have been adjusted as well as hypokalemia now on a stable dose of potassium repletion.  Since I saw her a year ago she is done well.  She has gained 15 pounds in the last 2 months.  She does have pulmonary fibrosis and complains of chronic shortness of breath.  She has 1-2+ pitting edema and is on 80 of furosemide in the morning with a serum creatinine of 1.9 when most recently checked.  I am going to add 40 mg of Lasix in the evening and we will recheck lab work in 7 to 10 days.   Current Meds  Medication Sig  . albuterol (PROVENTIL HFA;VENTOLIN HFA) 108 (90 BASE) MCG/ACT inhaler Inhale 2 puffs into the lungs 3 (three) times daily as needed for wheezing or shortness of breath.   [name] aspirin 81 MG chewable tablet Chew 81 mg by mouth daily.  [name] atorvastatin (LIPITOR) 40 MG tablet Take 40 mg by mouth at bedtime.   . benzonatate (TESSALON PERLES) 100 MG capsule Take 1 capsule (100 mg total) by mouth 3 (three) times daily as needed for cough.  . Calcium Carbonate-Vitamin D (OSCAL 500/200 D-3 PO) Take 1 tablet by mouth daily.   . fluticasone (FLONASE) 50 MCG/ACT nasal spray Place 1 spray into both nostrils daily as needed for allergies.   . furosemide (LASIX) 40 MG tablet Take 2 tablets (80 mg total) by mouth every morning AND 1 tablet (40 mg total) 2 (two) times daily at 10 am and 4 pm.  . furosemide (LASIX) 80 MG tablet TAKE ONE WHOLE TABLET (80MG ) IN THE MORNING AND HALF  TABLET (40 MG) IN THE EVENING.  [name] levothyroxine (SYNTHROID, LEVOTHROID) 100 MCG tablet Take 100 mcg by mouth daily before breakfast.   . LORazepam (ATIVAN) 1 MG tablet Take 1 mg by mouth 3 (three) times daily as needed for anxiety.   . metFORMIN (GLUCOPHAGE-XR) 500 MG 24 hr tablet Take 1,000 mg by mouth 2 (two) times daily.   . pantoprazole (PROTONIX) 40 MG tablet Take 1 tablet (40 mg total) by mouth daily.  . potassium chloride SA (K-DUR,KLOR-CON) 20 MEQ tablet Take 1 tablet (20 mEq total) by mouth daily.  . traMADol (ULTRAM) 50 MG  tablet Take 50 mg by mouth every 8 (eight) hours as needed for moderate pain.   . vitamin E (VITAMIN E) 400 UNIT capsule Take 400 Units by mouth daily.     Allergies  Allergen Reactions  . Pradaxa [Dabigatran Etexilate Mesylate] Nausea And Vomiting  . Nsaids Other (See Comments) and Itching    Stomach cramps Stomach cramps  . Sulfa Antibiotics Nausea And Vomiting and Other (See Comments)    GI upset  . Tizanidine Hcl Other (See Comments)    Dizziness, patient reports fall  . Betadine [Povidone Iodine] Itching  . Povidone-Iodine Rash    Social History   Socioeconomic History  . Marital status: Married    Spouse name: Not on file  . Number of children: Not on file  . Years of education: Not on file  . Highest education level: Not on file  Occupational History  . Not on file  Social Needs  . Financial resource strain: Not on file  . Food insecurity    Worry: Not on file    Inability: Not on file  . Transportation needs    Medical: Not on file    Non-medical: Not on file  Tobacco Use  . Smoking status: Never Smoker  . Smokeless tobacco: Never Used  Substance and Sexual Activity  . Alcohol use: No  . Drug use: No  . Sexual activity: Yes    Birth control/protection: Surgical  Lifestyle  . Physical activity    Days per week: Not on file    Minutes per session: Not on file  . Stress: Not on file  Relationships  . Social Herbalist on phone: Not on file    Gets together: Not on file    Attends religious service: Not on file    Active member of club or organization: Not on file    Attends meetings of clubs or organizations: Not on file    Relationship status: Not on file  . Intimate partner violence    Fear of current or ex partner: Not on file    Emotionally abused: Not on file    Physically abused: Not on file    Forced sexual activity: Not on file  Other Topics Concern  . Not on file  Social History Narrative  . Not on file     Review of Systems:  General: negative for chills, fever, night sweats or weight changes.  Cardiovascular: negative for chest pain, dyspnea on exertion, edema, orthopnea, palpitations, paroxysmal nocturnal dyspnea or shortness of breath Dermatological: negative for rash Respiratory: negative for cough or wheezing Urologic: negative for hematuria Abdominal: negative for nausea, vomiting, diarrhea, bright red blood per rectum, melena, or hematemesis Neurologic: negative for visual changes, syncope, or dizziness All other systems reviewed and are otherwise negative except as noted above.    Blood pressure 132/64, pulse 73, temperature (!) 96.8 F (  36 C), height 5\' 2"  (1.575 m), weight 153 lb (69.4 kg).  General appearance: alert and no distress Neck: no adenopathy, no carotid bruit, no JVD, supple, symmetrical, trachea midline and thyroid not enlarged, symmetric, no tenderness/mass/nodules Lungs: clear to auscultation bilaterally Heart: regular rate and rhythm, S1, S2 normal, no murmur, click, rub or gallop Extremities: 1-2+ bilateral lower extremity pitting edema Pulses: 2+ and symmetric Skin: Skin color, texture, turgor normal. No rashes or lesions Neurologic: Alert and oriented X 3, normal strength and tone. Normal symmetric reflexes. Normal coordination and gait  EKG ventricular paced rhythm at 73.  Personally reviewed this EKG.  ASSESSMENT AND PLAN:   Essential hypertension History of essential hypertension her blood pressure measured today 132/64.  She is on no antihypertensive medications.  Hyperlipidemia History of hyperlipidemia on statin therapy with lipid profile performed 01/20/2019 revealing total cholesterol 153, LDL 51 HDL 60  Lower extremity edema Bilateral lower extreme edema on 80 of furosemide in the morning.  Her serum creatinine when last measured 01/14/2019 was 1.9.  She has gained 15 pounds over the last 2 months.  I am going to add an additional 40 mg of Lasix in the evening and  will check a basic metabolic panel in 7 to 10 days.  Atrial fibrillation with RVR (HCC) History of chronic A. fib status post AV nodal ablation and permanent transvenous pacemaker insertion by Dr. Lovena Le in May 2019.  He last saw her in September of this year and in the pacer was Functioning well.  She was initially on oral anticoagulation which was stopped because of GI bleeding.  Chronic diastolic CHF (congestive heart failure) (HCC) History of chronic diastolic heart failure with 2D echo performed 11/01/2017 revealing normal LV systolic function.  Diastolic function could not be determined.      Lorretta Harp MD FACP,FACC,FAHA, [facility] 03/11/2019 4:19 PM

## 2019-03-11 NOTE — Assessment & Plan Note (Signed)
History of hyperlipidemia on statin therapy with lipid profile performed 01/20/2019 revealing total cholesterol 153, LDL 51 HDL 60

## 2019-03-11 NOTE — Patient Instructions (Signed)
Medication Instructions:  Take 40mg  Lasix at bedtime  If you need a refill on your cardiac medications before your next appointment, please call your pharmacy.   Lab work: BMET in 1 week If you have labs (blood work) drawn today and your tests are completely normal, you will receive your results only by: [place] (if you have MyChart) OR A paper copy in the mail If you have any lab test that is abnormal or we need to change your treatment, we will call you to review the results.  Testing/Procedures: NONE  Follow-Up: At [facility], you and your health needs are our priority.  As part of our continuing mission to provide you with exceptional heart care, we have created designated Provider Care Teams.  These Care Teams include your primary Cardiologist (physician) and Advanced Practice Providers (APPs -  Physician Assistants and Nurse Practitioners) who all work together to provide you with the care you need, when you need it. You may see Quay Burow, MD or one of the following Advanced Practice Providers on your designated Care Team:    Kerin Ransom, PA-C  [place], [place]  Coletta Memos, [place]  Your physician wants you to follow-up in: Kerin Ransom in 1 month Your physician wants you to follow-up in: 1 year with Dr [name]

## 2019-03-11 NOTE — Assessment & Plan Note (Signed)
Bilateral lower extreme edema on 80 of furosemide in the morning.  Her serum creatinine when last measured 01/14/2019 was 1.9.  She has gained 15 pounds over the last 2 months.  I am going to add an additional 40 mg of Lasix in the evening and will check a basic metabolic panel in 7 to 10 days.

## 2019-03-11 NOTE — Assessment & Plan Note (Signed)
History of chronic diastolic heart failure with 2D echo performed 11/01/2017 revealing normal LV systolic function.  Diastolic function could not be determined.

## 2019-03-11 NOTE — Progress Notes (Signed)
Complaint:  Visit Type: Patient returns to my office for continued preventative foot care services. Complaint: Patient states" my nails have grown long and thick and become painful to walk and wear shoes" Patient has been diagnosed with DM with no foot complications. The patient presents for preventative foot care services. No changes to ROS.  Patient is taking eliquiss.  Podiatric Exam: Vascular: dorsalis pedis and posterior tibial pulses are palpable bilateral. Capillary return is immediate. Temperature gradient is WNL. Skin turgor WNL  Sensorium: Normal Semmes Weinstein monofilament test. Normal tactile sensation bilaterally. Nail Exam: Pt has thick disfigured discolored nails with subungual debris noted bilateral all nails except hallux toes  B/L Ulcer Exam: There is no evidence of ulcer or pre-ulcerative changes or infection. Orthopedic Exam: Muscle tone and strength are WNL. No limitations in general ROM. No crepitus or effusions noted. HAV  B/L.  Hammer toes second left Skin: No Porokeratosis. No infection or ulcers  Diagnosis:  Onychomycosis, , Pain in right toe, pain in left toes  Treatment & Plan Procedures and Treatment: Consent by patient was obtained for treatment procedures. The patient understood the discussion of treatment and procedures well. All questions were answered thoroughly reviewed. Debridement of mycotic and hypertrophic toenails, 1 through 5 bilateral and clearing of subungual debris. No ulceration, no infection noted.  Return Visit-Office Procedure: Patient instructed to return to the office for a follow up visit 3 months for continued evaluation and treatment.    Annete Ayuso DPM 

## 2019-03-11 NOTE — Assessment & Plan Note (Signed)
History of chronic A. fib status post AV nodal ablation and permanent transvenous pacemaker insertion by Dr. Lovena Le in May 2019.  He last saw her in September of this year and in the pacer was Functioning well.  She was initially on oral anticoagulation which was stopped because of GI bleeding.

## 2019-03-11 NOTE — Assessment & Plan Note (Signed)
History of essential hypertension her blood pressure measured today 132/64.  She is on no antihypertensive medications.

## 2019-03-16 ENCOUNTER — Ambulatory Visit: Payer: Medicare Other | Admitting: Family

## 2019-03-16 ENCOUNTER — Other Ambulatory Visit: Payer: Medicare Other

## 2019-03-17 ENCOUNTER — Telehealth: Payer: Self-pay | Admitting: Cardiovascular Disease

## 2019-03-17 NOTE — Telephone Encounter (Signed)
Will ask Dr [name] if okay to write a letter for caregiver to accompany pt to lab appt ./cy

## 2019-03-17 NOTE — Telephone Encounter (Signed)
That is fine with me.

## 2019-03-17 NOTE — Telephone Encounter (Signed)
Patient's caregiver Gerda Diss calling to request an escort form for her to come with the patient on public transportation to her lab appointment. She states it can be faxed to [phone]. She also states she will need to come in to her appointment, because the patient uses a walker.

## 2019-03-18 ENCOUNTER — Encounter: Payer: Self-pay | Admitting: *Deleted

## 2019-03-18 NOTE — Telephone Encounter (Signed)
Letter faxed as requested ./cy

## 2019-03-20 DIAGNOSIS — I10 Essential (primary) hypertension: Secondary | ICD-10-CM | POA: Diagnosis not present

## 2019-03-20 DIAGNOSIS — E782 Mixed hyperlipidemia: Secondary | ICD-10-CM | POA: Diagnosis not present

## 2019-03-20 DIAGNOSIS — I5032 Chronic diastolic (congestive) heart failure: Secondary | ICD-10-CM | POA: Diagnosis not present

## 2019-03-20 LAB — BASIC METABOLIC PANEL
BUN/Creatinine Ratio: 20 (ref 12–28)
BUN: 43 mg/dL — ABNORMAL HIGH (ref 8–27)
CO2: 23 mmol/L (ref 20–29)
Calcium: 9.8 mg/dL (ref 8.7–10.3)
Chloride: 102 mmol/L (ref 96–106)
Creatinine, Ser: 2.12 mg/dL — ABNORMAL HIGH (ref 0.57–1.00)
GFR calc Af Amer: 24 mL/min/{1.73_m2} — ABNORMAL LOW (ref >59)
GFR calc non Af Amer: 21 mL/min/{1.73_m2} — ABNORMAL LOW (ref >59)
Glucose: 73 mg/dL (ref 65–99)
Potassium: 4.3 mmol/L (ref 3.5–5.2)
Sodium: 142 mmol/L (ref 134–144)

## 2019-03-23 DIAGNOSIS — S32591A Other specified fracture of right pubis, initial encounter for closed fracture: Secondary | ICD-10-CM | POA: Diagnosis not present

## 2019-03-23 DIAGNOSIS — S42294A Other nondisplaced fracture of upper end of right humerus, initial encounter for closed fracture: Secondary | ICD-10-CM | POA: Diagnosis not present

## 2019-03-24 ENCOUNTER — Other Ambulatory Visit: Payer: Medicare Other

## 2019-03-24 ENCOUNTER — Other Ambulatory Visit: Payer: Self-pay | Admitting: Orthopedic Surgery

## 2019-03-24 DIAGNOSIS — M899 Disorder of bone, unspecified: Secondary | ICD-10-CM

## 2019-03-25 ENCOUNTER — Encounter (HOSPITAL_COMMUNITY): Payer: Self-pay

## 2019-03-25 ENCOUNTER — Other Ambulatory Visit: Payer: Self-pay

## 2019-03-25 ENCOUNTER — Ambulatory Visit (HOSPITAL_COMMUNITY)
Admission: RE | Admit: 2019-03-25 | Discharge: 2019-03-25 | Disposition: A | Discharge status: Home / Self Care | Payer: Medicare Other | Source: Ambulatory Visit | Attending: Orthopedic Surgery | Admitting: Orthopedic Surgery

## 2019-03-25 DIAGNOSIS — S32591A Other specified fracture of right pubis, initial encounter for closed fracture: Secondary | ICD-10-CM | POA: Insufficient documentation

## 2019-03-25 DIAGNOSIS — M899 Disorder of bone, unspecified: Secondary | ICD-10-CM

## 2019-03-25 DIAGNOSIS — R102 Pelvic and perineal pain: Secondary | ICD-10-CM | POA: Insufficient documentation

## 2019-03-25 DIAGNOSIS — S32511A Fracture of superior rim of right pubis, initial encounter for closed fracture: Secondary | ICD-10-CM | POA: Diagnosis not present

## 2019-03-25 DIAGNOSIS — X58XXXA Exposure to other specified factors, initial encounter: Secondary | ICD-10-CM | POA: Insufficient documentation

## 2019-03-28 NOTE — Progress Notes (Signed)
PPM remote 

## 2019-04-13 ENCOUNTER — Other Ambulatory Visit: Payer: Medicare Other

## 2019-04-13 ENCOUNTER — Ambulatory Visit: Payer: Medicare Other | Admitting: Family

## 2019-04-14 DIAGNOSIS — S32591D Other specified fracture of right pubis, subsequent encounter for fracture with routine healing: Secondary | ICD-10-CM | POA: Diagnosis not present

## 2019-04-14 DIAGNOSIS — M25511 Pain in right shoulder: Secondary | ICD-10-CM | POA: Diagnosis not present

## 2019-04-16 ENCOUNTER — Other Ambulatory Visit: Payer: Self-pay

## 2019-04-16 ENCOUNTER — Encounter: Payer: Self-pay | Admitting: Cardiology

## 2019-04-16 ENCOUNTER — Ambulatory Visit (INDEPENDENT_AMBULATORY_CARE_PROVIDER_SITE_OTHER): Payer: Medicare Other | Admitting: Cardiology

## 2019-04-16 VITALS — BP 150/58 | HR 74 | Temp 97.3°F | Ht 63.0 in | Wt 141.0 lb

## 2019-04-16 DIAGNOSIS — I10 Essential (primary) hypertension: Secondary | ICD-10-CM | POA: Diagnosis not present

## 2019-04-16 DIAGNOSIS — D649 Anemia, unspecified: Secondary | ICD-10-CM | POA: Diagnosis not present

## 2019-04-16 DIAGNOSIS — I5032 Chronic diastolic (congestive) heart failure: Secondary | ICD-10-CM | POA: Diagnosis not present

## 2019-04-16 DIAGNOSIS — I4819 Other persistent atrial fibrillation: Secondary | ICD-10-CM | POA: Diagnosis not present

## 2019-04-16 DIAGNOSIS — J849 Interstitial pulmonary disease, unspecified: Secondary | ICD-10-CM

## 2019-04-16 DIAGNOSIS — N184 Chronic kidney disease, stage 4 (severe): Secondary | ICD-10-CM

## 2019-04-16 DIAGNOSIS — E1121 Type 2 diabetes mellitus with diabetic nephropathy: Secondary | ICD-10-CM

## 2019-04-16 DIAGNOSIS — Z950 Presence of cardiac pacemaker: Secondary | ICD-10-CM

## 2019-04-16 DIAGNOSIS — N2889 Other specified disorders of kidney and ureter: Secondary | ICD-10-CM | POA: Insufficient documentation

## 2019-04-16 NOTE — Progress Notes (Signed)
Cardiology Office Note:    Date:  04/16/2019   ID:  Ann Fletcher, DOB 1932-08-24, MRN [id number]  PCP:  [name], MD  Cardiologist:  Quay Burow, MD  Electrophysiologist:  [name], MD   Referring MD: [name], MD   No chief complaint on file.   History of Present Illness:    Ann Fletcher is a pleasant 86 y.o. female with a hx of AF, HTN, DM, and COPD/ILD. She had been on Amiodarone in the past but developed pulmonary fibrosis. Off Amiodarone she went back into AF with uncontrolled VR. She ultimately had an AVN RFA and pacemaker placed May 2019.  She was admitted 11/01/17 with CHF and significant anemia. She was diuresed from 72 kg to 69.6 kg. GI work up showed no obvious source of bleeding.  She has chronic anemia and has declined anticoagulation, Dr Marin Olp follows her.  She is in the office today for follow up from an office visit 03/11/2019.  Dr [name] felt she was volume overloaded then and increased her Lasix to 80/40. She denies any edema or dyspnea. Her weight today is down to 141 lbs. - from 153 lbs on 03/11/2019. In reviewing her past weights it appears she usually runs 135-140 lbs.   Since Dr [name] saw her in Dec she had a fall just before Christmas at home.  She had some new shoes and says she tripped.  She has a fractured Rt pelvis and Rt upper arm (near elbow).  No surgery is planned- she is to start PT next week.   Past Medical History:  Diagnosis Date  . Anxiety   . Arthritis   . AV node arrhythmia    a. s/p AV node ablation/PPM 07/2017.  [name] Chronic diastolic CHF (congestive heart failure) ([place])    a. Acute exacerbation occurred in setting of AF.  [name] CKD (chronic kidney disease), stage III   . Diabetes mellitus without complication ([place])    type 2  . 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 H/O bladder infections   . Hyperlipidemia   . Hypertension   . Hypothyroidism   . Lower extremity edema   . Osteoporosis   . Persistent atrial  fibrillation ([place])    a. diagnosed in 10/2014 by PCP, underwent failed TEE DCCV on 10/08/14, loaded with amio, successfully DCCV on 10/15/14.    Past Surgical History:  Procedure Laterality Date  . AV NODE ABLATION N/A 08/23/2017   Procedure: AV NODE ABLATION;  Surgeon: [name], MD;  Location: [place] [facility];  Service: Cardiovascular;  Laterality: N/A;  . BACK SURGERY  2001, 2003, 2006   x 3  . BIOPSY  11/06/2017   Procedure: BIOPSY;  Surgeon: [name], MD;  Location: [place];  Service: Endoscopy;;  . CARDIOVERSION N/A 10/08/2014   Procedure: CARDIOVERSION;  Surgeon: [name], MD;  Location: [place];  Service: Cardiovascular;  Laterality: N/A;  . CARDIOVERSION N/A 10/15/2014   Procedure: CARDIOVERSION;  Surgeon: [name], MD;  Location: [facility]COPY;  Service: Cardiovascular;  Laterality: N/A;  . CARDIOVERSION N/A 11/15/2014   Procedure: CARDIOVERSION;  Surgeon: [name], MD;  Location: [place];  Service: Cardiovascular;  Laterality: N/A;  . COLONOSCOPY N/A 11/06/2017   Procedure: COLONOSCOPY;  Surgeon: [name], MD;  Location: [facility] ENDOSCOPY;  Service: Endoscopy;  Laterality: N/A;  . ESOPHAGOGASTRODUODENOSCOPY N/A 11/06/2017   Procedure: ESOPHAGOGASTRODUODENOSCOPY (EGD);  Surgeon: [name], MD;  Location: [facility] ENDOSCOPY;  Service: Endoscopy;  Laterality: N/A;  .  PACEMAKER IMPLANT N/A 08/23/2017   Procedure: PACEMAKER IMPLANT;  Surgeon: [name], MD;  Location: [place] [facility];  Service: Cardiovascular;  Laterality: N/A;  . POLYPECTOMY  11/06/2017   Procedure: POLYPECTOMY;  Surgeon: [name], MD;  Location: [facility];  Service: Endoscopy;;  . 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 TEE WITHOUT CARDIOVERSION N/A 10/08/2014   Procedure: TRANSESOPHAGEAL ECHOCARDIOGRAM (TEE);  Surgeon: [name], MD;  Location: [place];  Service: Cardiovascular;  Laterality: N/A;  . TUBAL LIGATION      Current Medications: No outpatient  medications have been marked as taking for the 04/16/19 encounter (Office Visit) with Erlene Quan, PA-C.     Allergies:   Pradaxa [dabigatran etexilate mesylate], Nsaids, Sulfa antibiotics, Tizanidine hcl, Betadine [povidone iodine], and Povidone-iodine   Social History   Socioeconomic History  . Marital status: Married    Spouse name: Not on file  . Number of children: Not on file  . Years of education: Not on file  . Highest education level: Not on file  Occupational History  . Not on file  Tobacco Use  . Smoking status: Never Smoker  . Smokeless tobacco: Never Used  Substance and Sexual Activity  . Alcohol use: No  . Drug use: No  . Sexual activity: Yes    Birth control/protection: Surgical  Other Topics Concern  . Not on file  Social History Narrative  . Not on file   Social Determinants of Health   Financial Resource Strain:   . Difficulty of Paying Living Expenses: Not on file  Food Insecurity:   . Worried About Charity fundraiser in the Last Year: Not on file  . Ran Out of Food in the Last Year: Not on file  Transportation Needs:   . Lack of Transportation (Medical): Not on file  . Lack of Transportation (Non-Medical): Not on file  Physical Activity:   . Days of Exercise per Week: Not on file  . Minutes of Exercise per Session: Not on file  Stress:   . Feeling of Stress : Not on file  Social Connections:   . Frequency of Communication with Friends and Family: Not on file  . Frequency of Social Gatherings with Friends and Family: Not on file  . Attends Religious Services: Not on file  . Active Member of Clubs or Organizations: Not on file  . Attends Archivist Meetings: Not on file  . Marital Status: Not on file     Family History: The patient's family history includes Diabetes in her sister; Heart attack in her father; Pneumonia in her mother.  ROS:   Please see the history of present illness.     All other systems reviewed and are  negative.  EKGs/Labs/Other Studies Reviewed:    The following studies were reviewed today: Echo Aug 2019  EKG:  EKG is not ordered today.    Recent Labs: 01/14/2019: ALT 14; Hemoglobin 11.0; Platelet Count 178 03/20/2019: BUN 43; Creatinine, Ser 2.12; Potassium 4.3; Sodium 142  Recent Lipid Panel No results found for: CHOL, TRIG, HDL, CHOLHDL, VLDL, LDLCALC, LDLDIRECT  Physical Exam:    VS:  BP (!) 150/58   Pulse 74   Temp (!) 97.3 F (36.3 C) (Temporal)   Ht 5\' 3"  (1.6 m)   Wt 141 lb (64 kg)   SpO2 95%   BMI 24.98 kg/m     Wt Readings from Last 3 Encounters:  04/16/19 141 lb (64 kg)  03/11/19 153  lb (69.4 kg)  01/14/19 138 lb (62.6 kg)     GEN: Elderly, frail, female, well developed in no acute distress, in wheel chair HEENT: Normal NECK: No JVD; No carotid bruits CARDIAC: RRR, no murmurs, rubs, gallops RESPIRATORY:  Clear to auscultation without rales, wheezing or rhonchi  ABDOMEN: Soft, non-tender, non-distended MUSCULOSKELETAL:  No edema; Rt arm in sling SKIN: Warm and dry NEUROLOGIC:  Alert and oriented x 3 PSYCHIATRIC:  Normal affect   ASSESSMENT:    Acute on chronic diastolic CHF (congestive heart failure) (HCC) Currently stable on Lasix 80/40  Atrial fibrillation with RVR (HCC) S/P AVN ablation and PTVDP May 2019  Anemia Iron deficiency anemia. No obvious bleeding with endoscopy and colonoscopy 11/06/17. Dr Marin Olp follows  Interstitial lung disease [facility]) ILD on CT and on exam  Essential hypertension Fair control  Fall- Before Christmas- pelvic and arm fracture- conservative Rx  CRI- Her SCR has been gradually drifting up.  She tells me her PCP has been following her labs.    PLAN:    Same Rx- Dr [name] in 6 months  Medication Adjustments/Labs and Tests Ordered: Current medicines are reviewed at length with the patient today.  Concerns regarding medicines are outlined above.  No orders of the defined types were placed in this  encounter.  No orders of the defined types were placed in this encounter.   Patient Instructions  Medication Instructions:  Your physician recommends that you continue on your current medications as directed. Please refer to the Current Medication list given to you today. *If you need a refill on your cardiac medications before your next appointment, please call your pharmacy*  Lab Work: None  If you have labs (blood work) drawn today and your tests are completely normal, you will receive your results only by: [name] MyChart Message (if you have MyChart) OR . A paper copy in the mail If you have any lab test that is abnormal or we need to change your treatment, we will call you to review the results.  Testing/Procedures: None   Follow-Up: At [facility], you and your health needs are our priority.  As part of our continuing mission to provide you with exceptional heart care, we have created designated Provider Care Teams.  These Care Teams include your primary Cardiologist (physician) and Advanced Practice Providers (APPs -  Physician Assistants and Nurse Practitioners) who all work together to provide you with the care you need, when you need it.  Your next appointment:   6 month(s)  The format for your next appointment:   In Person  Provider:   Quay Burow, MD  Other Instructions     Signed, Kerin Ransom, PA-C  04/16/2019 2:20 PM    [place]

## 2019-04-16 NOTE — Patient Instructions (Signed)
Medication Instructions:  Your physician recommends that you continue on your current medications as directed. Please refer to the Current Medication list given to you today. *If you need a refill on your cardiac medications before your next appointment, please call your pharmacy*  Lab Work: None  If you have labs (blood work) drawn today and your tests are completely normal, you will receive your results only by: [name] MyChart Message (if you have MyChart) OR . A paper copy in the mail If you have any lab test that is abnormal or we need to change your treatment, we will call you to review the results.  Testing/Procedures: None   Follow-Up: At [facility], you and your health needs are our priority.  As part of our continuing mission to provide you with exceptional heart care, we have created designated Provider Care Teams.  These Care Teams include your primary Cardiologist (physician) and Advanced Practice Providers (APPs -  Physician Assistants and Nurse Practitioners) who all work together to provide you with the care you need, when you need it.  Your next appointment:   6 month(s)  The format for your next appointment:   In Person  Provider:   Quay Burow, MD  Other Instructions

## 2019-04-20 ENCOUNTER — Inpatient Hospital Stay: Payer: Medicare Other

## 2019-04-20 ENCOUNTER — Other Ambulatory Visit: Payer: Self-pay

## 2019-04-20 ENCOUNTER — Telehealth: Payer: Self-pay | Admitting: Family

## 2019-04-20 ENCOUNTER — Inpatient Hospital Stay (HOSPITAL_BASED_OUTPATIENT_CLINIC_OR_DEPARTMENT_OTHER): Payer: Medicare Other | Admitting: Family

## 2019-04-20 ENCOUNTER — Inpatient Hospital Stay: Payer: Medicare Other | Attending: Hematology & Oncology

## 2019-04-20 VITALS — BP 144/55 | HR 72 | Temp 97.1°F | Wt 142.0 lb

## 2019-04-20 DIAGNOSIS — R200 Anesthesia of skin: Secondary | ICD-10-CM | POA: Insufficient documentation

## 2019-04-20 DIAGNOSIS — D631 Anemia in chronic kidney disease: Secondary | ICD-10-CM

## 2019-04-20 DIAGNOSIS — D500 Iron deficiency anemia secondary to blood loss (chronic): Secondary | ICD-10-CM | POA: Insufficient documentation

## 2019-04-20 DIAGNOSIS — D508 Other iron deficiency anemias: Secondary | ICD-10-CM | POA: Diagnosis present

## 2019-04-20 DIAGNOSIS — D509 Iron deficiency anemia, unspecified: Secondary | ICD-10-CM | POA: Insufficient documentation

## 2019-04-20 DIAGNOSIS — Z79899 Other long term (current) drug therapy: Secondary | ICD-10-CM | POA: Insufficient documentation

## 2019-04-20 DIAGNOSIS — K909 Intestinal malabsorption, unspecified: Secondary | ICD-10-CM | POA: Diagnosis not present

## 2019-04-20 DIAGNOSIS — R202 Paresthesia of skin: Secondary | ICD-10-CM | POA: Diagnosis not present

## 2019-04-20 LAB — CBC WITH DIFFERENTIAL (CANCER CENTER ONLY)
Abs Immature Granulocytes: 0.03 10*3/uL (ref 0.00–0.07)
Basophils Absolute: 0.1 10*3/uL (ref 0.0–0.1)
Basophils Relative: 1 %
Eosinophils Absolute: 0.2 10*3/uL (ref 0.0–0.5)
Eosinophils Relative: 2 %
HCT: 34 % — ABNORMAL LOW (ref 36.0–46.0)
Hemoglobin: 10.4 g/dL — ABNORMAL LOW (ref 12.0–15.0)
Immature Granulocytes: 0 %
Lymphocytes Relative: 24 %
Lymphs Abs: 2.1 10*3/uL (ref 0.7–4.0)
MCH: 26.1 pg (ref 26.0–34.0)
MCHC: 30.6 g/dL (ref 30.0–36.0)
MCV: 85.4 fL (ref 80.0–100.0)
Monocytes Absolute: 0.9 10*3/uL (ref 0.1–1.0)
Monocytes Relative: 10 %
Neutro Abs: 5.6 10*3/uL (ref 1.7–7.7)
Neutrophils Relative %: 63 %
Platelet Count: 195 10*3/uL (ref 150–400)
RBC: 3.98 MIL/uL (ref 3.87–5.11)
RDW: 15.5 % (ref 11.5–15.5)
WBC Count: 8.9 10*3/uL (ref 4.0–10.5)
nRBC: 0 % (ref 0.0–0.2)

## 2019-04-20 LAB — CMP (CANCER CENTER ONLY)
ALT: 16 U/L (ref 0–44)
AST: 23 U/L (ref 15–41)
Albumin: 4.3 g/dL (ref 3.5–5.0)
Alkaline Phosphatase: 99 U/L (ref 38–126)
Anion gap: 9 (ref 5–15)
BUN: 58 mg/dL — ABNORMAL HIGH (ref 8–23)
CO2: 29 mmol/L (ref 22–32)
Calcium: 10.4 mg/dL — ABNORMAL HIGH (ref 8.9–10.3)
Chloride: 103 mmol/L (ref 98–111)
Creatinine: 1.44 mg/dL — ABNORMAL HIGH (ref 0.44–1.00)
GFR, Est AFR Am: 39 mL/min — ABNORMAL LOW (ref >60)
GFR, Estimated: 33 mL/min — ABNORMAL LOW (ref >60)
Glucose, Bld: 162 mg/dL — ABNORMAL HIGH (ref 70–99)
Potassium: 4.1 mmol/L (ref 3.5–5.1)
Sodium: 141 mmol/L (ref 135–145)
Total Bilirubin: 0.4 mg/dL (ref 0.3–1.2)
Total Protein: 7.3 g/dL (ref 6.5–8.1)

## 2019-04-20 LAB — RETICULOCYTES
Immature Retic Fract: 11.3 % (ref 2.3–15.9)
RBC.: 3.93 MIL/uL (ref 3.87–5.11)
Retic Count, Absolute: 36.9 10*3/uL (ref 19.0–186.0)
Retic Ct Pct: 0.9 % (ref 0.4–3.1)

## 2019-04-20 MED ORDER — EPOETIN ALFA-EPBX 40000 UNIT/ML IJ SOLN
INTRAMUSCULAR | Status: AC
  Filled 2019-04-20: qty 1

## 2019-04-20 MED ORDER — EPOETIN ALFA-EPBX 40000 UNIT/ML IJ SOLN
40000.0000 [IU] | Freq: Once | INTRAMUSCULAR | Status: AC
  Administered 2019-04-20: 40000 [IU] via SUBCUTANEOUS

## 2019-04-20 NOTE — Patient Instructions (Signed)

## 2019-04-20 NOTE — Telephone Encounter (Signed)
Called and spoke with care giver regarding appointments that have been scheduled / letter/calendar was mailed to patient was well. Per 1/18 los

## 2019-04-20 NOTE — Progress Notes (Signed)
Hematology and Oncology Follow Up Visit  Ann Fletcher [id number] May 21, 1932 86 y.o. 04/20/2019   Principle Diagnosis:  Iron deficiency anemia -- iron malabsorption and chronic blood loss Erythropoitin deficiencyanemia  Current Therapy:   IV iron as indicated Retacrit 40,000 unit SQ as indicated for Hgb < 11   Interim History:  Ann Fletcher is here today for follow-up and Aranesp injection. She had a fall the weekend before Christmas and states that she has a cracked pelvis and collar bone. She is wearing a brace on the right arm and is in a wheel chair today. She states that she will soon be starting in home PT. She states that she and her husband have a care giver during the day Monday-Friday.  She has mild swelling in the fingers of her right hand. No other swelling noted. She has some bruising that is fading on the right arm.  No episodes of bleeding to report.  The numbness and tingling in her fingertips is unchanged.  No new falls. No syncope.  No fever, chills, n/v, cough, rash, dizziness, SOB, chest pain, palpitations, abdominal pain or changes in bowel or bladder habits.  She has maintained a good appetite and is staying well hydrated.   ECOG Performance Status: 1 - Symptomatic but completely ambulatory  Medications:  Allergies as of 04/20/2019      Reactions   Pradaxa [dabigatran Etexilate Mesylate] Nausea And Vomiting   Nsaids Other (See Comments), Itching   Stomach cramps Stomach cramps   Sulfa Antibiotics Nausea And Vomiting, Other (See Comments)   GI upset   Tizanidine Hcl Other (See Comments)   Dizziness, patient reports fall   Betadine [povidone Iodine] Itching   Povidone-iodine Rash      Medication List       Accurate as of April 20, 2019  1:36 PM. If you have any questions, ask your nurse or doctor.        albuterol 108 (90 Base) MCG/ACT inhaler Commonly known as: VENTOLIN HFA Inhale 2 puffs into the lungs 3 (three) times daily as needed for wheezing  or shortness of breath.   aspirin 81 MG chewable tablet Chew 81 mg by mouth daily.   atorvastatin 40 MG tablet Commonly known as: LIPITOR Take 40 mg by mouth at bedtime.   fluticasone 50 MCG/ACT nasal spray Commonly known as: FLONASE Place 1 spray into both nostrils daily as needed for allergies.   furosemide 80 MG tablet Commonly known as: LASIX TAKE ONE WHOLE TABLET (80MG ) IN THE MORNING AND HALF TABLET (40 MG) IN THE EVENING.   glimepiride 2 MG tablet Commonly known as: AMARYL Take 2 mg by mouth every morning.   levothyroxine 100 MCG tablet Commonly known as: SYNTHROID Take 100 mcg by mouth daily before breakfast.   LORazepam 1 MG tablet Commonly known as: ATIVAN Take 1 mg by mouth 3 (three) times daily as needed for anxiety.   OSCAL 500/200 D-3 PO Take 1 tablet by mouth daily.   potassium chloride SA 20 MEQ tablet Commonly known as: KLOR-CON Take 1 tablet (20 mEq total) by mouth daily.   traMADol 50 MG tablet Commonly known as: ULTRAM Take 50 mg by mouth every 8 (eight) hours as needed for moderate pain.   vitamin E 180 MG (400 UNITS) capsule Generic drug: vitamin E Take 400 Units by mouth daily.       Allergies:  Allergies  Allergen Reactions  . Pradaxa [Dabigatran Etexilate Mesylate] Nausea And Vomiting  . Nsaids Other (See  Comments) and Itching    Stomach cramps Stomach cramps  . Sulfa Antibiotics Nausea And Vomiting and Other (See Comments)    GI upset  . Tizanidine Hcl Other (See Comments)    Dizziness, patient reports fall  . Betadine [Povidone Iodine] Itching  . Povidone-Iodine Rash    Past Medical History, Surgical history, Social history, and Family History were reviewed and updated.  Review of Systems: All other 10 point review of systems is negative.   Physical Exam:  vitals were not taken for this visit.   Wt Readings from Last 3 Encounters:  04/16/19 141 lb (64 kg)  03/11/19 153 lb (69.4 kg)  01/14/19 138 lb (62.6 kg)     Ocular: Sclerae unicteric, pupils equal, round and reactive to light Ear-nose-throat: Oropharynx clear, dentition fair Lymphatic: No cervical or supraclavicular adenopathy Lungs no rales or rhonchi, good excursion bilaterally Heart regular rate and rhythm, no murmur appreciated Abd soft, nontender, positive bowel sounds, no liver or spleen tip palpated on exam, no fluid wave  MSK no focal spinal tenderness, no joint edema Neuro: non-focal, well-oriented, appropriate affect Breasts: Deferred   Lab Results  Component Value Date   WBC 8.9 04/20/2019   HGB 10.4 (L) 04/20/2019   HCT 34.0 (L) 04/20/2019   MCV 85.4 04/20/2019   PLT 195 04/20/2019   Lab Results  Component Value Date   FERRITIN 493 (H) 01/14/2019   IRON 47 01/14/2019   TIBC 281 01/14/2019   UIBC 234 01/14/2019   IRONPCTSAT 17 (L) 01/14/2019   Lab Results  Component Value Date   RETICCTPCT 0.9 04/20/2019   RBC 3.98 04/20/2019   RBC 3.93 04/20/2019   No results found for: KPAFRELGTCHN, LAMBDASER, KAPLAMBRATIO No results found for: IGGSERUM, IGA, IGMSERUM No results found for: Kathrynn Ducking, MSPIKE, SPEI   Chemistry      Component Value Date/Time   NA 142 03/20/2019 1032   K 4.3 03/20/2019 1032   CL 102 03/20/2019 1032   CO2 23 03/20/2019 1032   BUN 43 (H) 03/20/2019 1032   CREATININE 2.12 (H) 03/20/2019 1032   CREATININE 1.91 (H) 01/14/2019 1331   CREATININE 1.15 (H) 10/29/2014 1608      Component Value Date/Time   CALCIUM 9.8 03/20/2019 1032   ALKPHOS 59 01/14/2019 1331   AST 24 01/14/2019 1331   ALT 14 01/14/2019 1331   BILITOT 0.6 01/14/2019 1331       Impression and Plan:  Ann Fletcher is a very pleasant 86 yo caucasian female with both iron deficiency anderythropoietin deficiency anemias. She did receive Aranesp today for Hgb 10.4.  We will see what her iron studies show and replace if needed.  We will see her back in 1 month for follow-up.  She  will contact our office with any questions or concerns. We can certainly see her sooner if needed.   [name], NP 1/18/20211:36 PM

## 2019-04-21 ENCOUNTER — Telehealth: Payer: Self-pay | Admitting: Family

## 2019-04-21 LAB — FERRITIN: Ferritin: 716 ng/mL — ABNORMAL HIGH (ref 11–307)

## 2019-04-21 LAB — IRON AND TIBC
Iron: 46 ug/dL (ref 41–142)
Saturation Ratios: 17 % — ABNORMAL LOW (ref 21–57)
TIBC: 279 ug/dL (ref 236–444)
UIBC: 233 ug/dL (ref 120–384)

## 2019-04-21 NOTE — Telephone Encounter (Signed)
Appointments scheduled per 1/19  sch msg.patients caregiver   Was ok with all dates/time

## 2019-04-24 ENCOUNTER — Inpatient Hospital Stay: Payer: Medicare Other

## 2019-04-27 ENCOUNTER — Inpatient Hospital Stay: Payer: Medicare Other

## 2019-04-27 ENCOUNTER — Other Ambulatory Visit: Payer: Self-pay

## 2019-04-27 VITALS — BP 150/68 | HR 72

## 2019-04-27 DIAGNOSIS — D500 Iron deficiency anemia secondary to blood loss (chronic): Secondary | ICD-10-CM

## 2019-04-27 DIAGNOSIS — R202 Paresthesia of skin: Secondary | ICD-10-CM | POA: Diagnosis not present

## 2019-04-27 DIAGNOSIS — Z79899 Other long term (current) drug therapy: Secondary | ICD-10-CM | POA: Diagnosis not present

## 2019-04-27 DIAGNOSIS — R200 Anesthesia of skin: Secondary | ICD-10-CM | POA: Diagnosis not present

## 2019-04-27 DIAGNOSIS — K909 Intestinal malabsorption, unspecified: Secondary | ICD-10-CM | POA: Diagnosis not present

## 2019-04-27 DIAGNOSIS — D509 Iron deficiency anemia, unspecified: Secondary | ICD-10-CM | POA: Diagnosis not present

## 2019-04-27 MED ORDER — SODIUM CHLORIDE 0.9 % IV SOLN
200.0000 mg | Freq: Once | INTRAVENOUS | Status: AC
  Administered 2019-04-27: 200 mg via INTRAVENOUS
  Filled 2019-04-27: qty 200

## 2019-04-27 MED ORDER — SODIUM CHLORIDE 0.9 % IV SOLN
Freq: Once | INTRAVENOUS | Status: AC
  Filled 2019-04-27: qty 250

## 2019-04-27 NOTE — Patient Instructions (Signed)

## 2019-04-29 ENCOUNTER — Inpatient Hospital Stay: Payer: Medicare Other

## 2019-04-29 ENCOUNTER — Other Ambulatory Visit: Payer: Self-pay

## 2019-04-29 ENCOUNTER — Ambulatory Visit: Payer: Medicare Other

## 2019-04-29 VITALS — BP 138/57 | HR 70 | Temp 97.3°F | Resp 17

## 2019-04-29 DIAGNOSIS — K909 Intestinal malabsorption, unspecified: Secondary | ICD-10-CM | POA: Diagnosis not present

## 2019-04-29 DIAGNOSIS — D500 Iron deficiency anemia secondary to blood loss (chronic): Secondary | ICD-10-CM

## 2019-04-29 DIAGNOSIS — Z79899 Other long term (current) drug therapy: Secondary | ICD-10-CM | POA: Diagnosis not present

## 2019-04-29 DIAGNOSIS — R202 Paresthesia of skin: Secondary | ICD-10-CM | POA: Diagnosis not present

## 2019-04-29 DIAGNOSIS — D509 Iron deficiency anemia, unspecified: Secondary | ICD-10-CM | POA: Diagnosis not present

## 2019-04-29 DIAGNOSIS — R200 Anesthesia of skin: Secondary | ICD-10-CM | POA: Diagnosis not present

## 2019-04-29 MED ORDER — SODIUM CHLORIDE 0.9 % IV SOLN: 200.0000 mg | Freq: Once | INTRAVENOUS | Status: DC

## 2019-04-29 MED ORDER — SODIUM CHLORIDE 0.9 % IV SOLN
Freq: Once | INTRAVENOUS | Status: AC
  Filled 2019-04-29: qty 250

## 2019-04-29 MED ORDER — SODIUM CHLORIDE 0.9 % IV SOLN
200.0000 mg | Freq: Once | INTRAVENOUS | Status: AC
  Administered 2019-04-29: 200 mg via INTRAVENOUS
  Filled 2019-04-29: qty 200

## 2019-04-29 NOTE — Patient Instructions (Signed)

## 2019-04-30 DIAGNOSIS — I5033 Acute on chronic diastolic (congestive) heart failure: Secondary | ICD-10-CM | POA: Diagnosis not present

## 2019-04-30 DIAGNOSIS — I2510 Atherosclerotic heart disease of native coronary artery without angina pectoris: Secondary | ICD-10-CM | POA: Diagnosis not present

## 2019-04-30 DIAGNOSIS — I4819 Other persistent atrial fibrillation: Secondary | ICD-10-CM | POA: Diagnosis not present

## 2019-04-30 DIAGNOSIS — N184 Chronic kidney disease, stage 4 (severe): Secondary | ICD-10-CM | POA: Diagnosis not present

## 2019-04-30 DIAGNOSIS — Z7951 Long term (current) use of inhaled steroids: Secondary | ICD-10-CM | POA: Diagnosis not present

## 2019-04-30 DIAGNOSIS — J849 Interstitial pulmonary disease, unspecified: Secondary | ICD-10-CM | POA: Diagnosis not present

## 2019-04-30 DIAGNOSIS — Z7984 Long term (current) use of oral hypoglycemic drugs: Secondary | ICD-10-CM | POA: Diagnosis not present

## 2019-04-30 DIAGNOSIS — Z9181 History of falling: Secondary | ICD-10-CM | POA: Diagnosis not present

## 2019-04-30 DIAGNOSIS — I130 Hypertensive heart and chronic kidney disease with heart failure and stage 1 through stage 4 chronic kidney disease, or unspecified chronic kidney disease: Secondary | ICD-10-CM | POA: Diagnosis not present

## 2019-04-30 DIAGNOSIS — Z950 Presence of cardiac pacemaker: Secondary | ICD-10-CM | POA: Diagnosis not present

## 2019-04-30 DIAGNOSIS — M1990 Unspecified osteoarthritis, unspecified site: Secondary | ICD-10-CM | POA: Diagnosis not present

## 2019-04-30 DIAGNOSIS — M80021D Age-related osteoporosis with current pathological fracture, right humerus, subsequent encounter for fracture with routine healing: Secondary | ICD-10-CM | POA: Diagnosis not present

## 2019-04-30 DIAGNOSIS — D500 Iron deficiency anemia secondary to blood loss (chronic): Secondary | ICD-10-CM | POA: Diagnosis not present

## 2019-04-30 DIAGNOSIS — E785 Hyperlipidemia, unspecified: Secondary | ICD-10-CM | POA: Diagnosis not present

## 2019-04-30 DIAGNOSIS — E1122 Type 2 diabetes mellitus with diabetic chronic kidney disease: Secondary | ICD-10-CM | POA: Diagnosis not present

## 2019-04-30 DIAGNOSIS — E039 Hypothyroidism, unspecified: Secondary | ICD-10-CM | POA: Diagnosis not present

## 2019-04-30 DIAGNOSIS — D631 Anemia in chronic kidney disease: Secondary | ICD-10-CM | POA: Diagnosis not present

## 2019-04-30 DIAGNOSIS — J449 Chronic obstructive pulmonary disease, unspecified: Secondary | ICD-10-CM | POA: Diagnosis not present

## 2019-04-30 DIAGNOSIS — M80051D Age-related osteoporosis with current pathological fracture, right femur, subsequent encounter for fracture with routine healing: Secondary | ICD-10-CM | POA: Diagnosis not present

## 2019-04-30 DIAGNOSIS — Z7982 Long term (current) use of aspirin: Secondary | ICD-10-CM | POA: Diagnosis not present

## 2019-05-05 DIAGNOSIS — M25511 Pain in right shoulder: Secondary | ICD-10-CM | POA: Diagnosis not present

## 2019-05-05 DIAGNOSIS — M25551 Pain in right hip: Secondary | ICD-10-CM | POA: Diagnosis not present

## 2019-05-06 DIAGNOSIS — E039 Hypothyroidism, unspecified: Secondary | ICD-10-CM | POA: Diagnosis not present

## 2019-05-06 DIAGNOSIS — I2510 Atherosclerotic heart disease of native coronary artery without angina pectoris: Secondary | ICD-10-CM | POA: Diagnosis not present

## 2019-05-06 DIAGNOSIS — I130 Hypertensive heart and chronic kidney disease with heart failure and stage 1 through stage 4 chronic kidney disease, or unspecified chronic kidney disease: Secondary | ICD-10-CM | POA: Diagnosis not present

## 2019-05-06 DIAGNOSIS — Z7951 Long term (current) use of inhaled steroids: Secondary | ICD-10-CM | POA: Diagnosis not present

## 2019-05-06 DIAGNOSIS — Z950 Presence of cardiac pacemaker: Secondary | ICD-10-CM | POA: Diagnosis not present

## 2019-05-06 DIAGNOSIS — Z7982 Long term (current) use of aspirin: Secondary | ICD-10-CM | POA: Diagnosis not present

## 2019-05-06 DIAGNOSIS — J449 Chronic obstructive pulmonary disease, unspecified: Secondary | ICD-10-CM | POA: Diagnosis not present

## 2019-05-06 DIAGNOSIS — J849 Interstitial pulmonary disease, unspecified: Secondary | ICD-10-CM | POA: Diagnosis not present

## 2019-05-06 DIAGNOSIS — D500 Iron deficiency anemia secondary to blood loss (chronic): Secondary | ICD-10-CM | POA: Diagnosis not present

## 2019-05-06 DIAGNOSIS — Z9181 History of falling: Secondary | ICD-10-CM | POA: Diagnosis not present

## 2019-05-06 DIAGNOSIS — M80051D Age-related osteoporosis with current pathological fracture, right femur, subsequent encounter for fracture with routine healing: Secondary | ICD-10-CM | POA: Diagnosis not present

## 2019-05-06 DIAGNOSIS — E785 Hyperlipidemia, unspecified: Secondary | ICD-10-CM | POA: Diagnosis not present

## 2019-05-06 DIAGNOSIS — E1122 Type 2 diabetes mellitus with diabetic chronic kidney disease: Secondary | ICD-10-CM | POA: Diagnosis not present

## 2019-05-06 DIAGNOSIS — M1990 Unspecified osteoarthritis, unspecified site: Secondary | ICD-10-CM | POA: Diagnosis not present

## 2019-05-06 DIAGNOSIS — M80021D Age-related osteoporosis with current pathological fracture, right humerus, subsequent encounter for fracture with routine healing: Secondary | ICD-10-CM | POA: Diagnosis not present

## 2019-05-06 DIAGNOSIS — Z7984 Long term (current) use of oral hypoglycemic drugs: Secondary | ICD-10-CM | POA: Diagnosis not present

## 2019-05-06 DIAGNOSIS — I4819 Other persistent atrial fibrillation: Secondary | ICD-10-CM | POA: Diagnosis not present

## 2019-05-06 DIAGNOSIS — N184 Chronic kidney disease, stage 4 (severe): Secondary | ICD-10-CM | POA: Diagnosis not present

## 2019-05-06 DIAGNOSIS — D631 Anemia in chronic kidney disease: Secondary | ICD-10-CM | POA: Diagnosis not present

## 2019-05-06 DIAGNOSIS — I5033 Acute on chronic diastolic (congestive) heart failure: Secondary | ICD-10-CM | POA: Diagnosis not present

## 2019-05-13 DIAGNOSIS — E785 Hyperlipidemia, unspecified: Secondary | ICD-10-CM | POA: Diagnosis not present

## 2019-05-13 DIAGNOSIS — Z9181 History of falling: Secondary | ICD-10-CM | POA: Diagnosis not present

## 2019-05-13 DIAGNOSIS — N184 Chronic kidney disease, stage 4 (severe): Secondary | ICD-10-CM | POA: Diagnosis not present

## 2019-05-13 DIAGNOSIS — E1122 Type 2 diabetes mellitus with diabetic chronic kidney disease: Secondary | ICD-10-CM | POA: Diagnosis not present

## 2019-05-13 DIAGNOSIS — I5033 Acute on chronic diastolic (congestive) heart failure: Secondary | ICD-10-CM | POA: Diagnosis not present

## 2019-05-13 DIAGNOSIS — I2510 Atherosclerotic heart disease of native coronary artery without angina pectoris: Secondary | ICD-10-CM | POA: Diagnosis not present

## 2019-05-13 DIAGNOSIS — M1990 Unspecified osteoarthritis, unspecified site: Secondary | ICD-10-CM | POA: Diagnosis not present

## 2019-05-13 DIAGNOSIS — E039 Hypothyroidism, unspecified: Secondary | ICD-10-CM | POA: Diagnosis not present

## 2019-05-13 DIAGNOSIS — M80021D Age-related osteoporosis with current pathological fracture, right humerus, subsequent encounter for fracture with routine healing: Secondary | ICD-10-CM | POA: Diagnosis not present

## 2019-05-13 DIAGNOSIS — Z950 Presence of cardiac pacemaker: Secondary | ICD-10-CM | POA: Diagnosis not present

## 2019-05-13 DIAGNOSIS — Z7984 Long term (current) use of oral hypoglycemic drugs: Secondary | ICD-10-CM | POA: Diagnosis not present

## 2019-05-13 DIAGNOSIS — D631 Anemia in chronic kidney disease: Secondary | ICD-10-CM | POA: Diagnosis not present

## 2019-05-13 DIAGNOSIS — I4819 Other persistent atrial fibrillation: Secondary | ICD-10-CM | POA: Diagnosis not present

## 2019-05-13 DIAGNOSIS — Z7951 Long term (current) use of inhaled steroids: Secondary | ICD-10-CM | POA: Diagnosis not present

## 2019-05-13 DIAGNOSIS — I130 Hypertensive heart and chronic kidney disease with heart failure and stage 1 through stage 4 chronic kidney disease, or unspecified chronic kidney disease: Secondary | ICD-10-CM | POA: Diagnosis not present

## 2019-05-13 DIAGNOSIS — J849 Interstitial pulmonary disease, unspecified: Secondary | ICD-10-CM | POA: Diagnosis not present

## 2019-05-13 DIAGNOSIS — Z7982 Long term (current) use of aspirin: Secondary | ICD-10-CM | POA: Diagnosis not present

## 2019-05-13 DIAGNOSIS — D500 Iron deficiency anemia secondary to blood loss (chronic): Secondary | ICD-10-CM | POA: Diagnosis not present

## 2019-05-13 DIAGNOSIS — M80051D Age-related osteoporosis with current pathological fracture, right femur, subsequent encounter for fracture with routine healing: Secondary | ICD-10-CM | POA: Diagnosis not present

## 2019-05-13 DIAGNOSIS — J449 Chronic obstructive pulmonary disease, unspecified: Secondary | ICD-10-CM | POA: Diagnosis not present

## 2019-05-20 ENCOUNTER — Telehealth: Payer: Self-pay | Admitting: Family

## 2019-05-20 DIAGNOSIS — I5033 Acute on chronic diastolic (congestive) heart failure: Secondary | ICD-10-CM | POA: Diagnosis not present

## 2019-05-20 DIAGNOSIS — J849 Interstitial pulmonary disease, unspecified: Secondary | ICD-10-CM | POA: Diagnosis not present

## 2019-05-20 DIAGNOSIS — I130 Hypertensive heart and chronic kidney disease with heart failure and stage 1 through stage 4 chronic kidney disease, or unspecified chronic kidney disease: Secondary | ICD-10-CM | POA: Diagnosis not present

## 2019-05-20 DIAGNOSIS — Z7951 Long term (current) use of inhaled steroids: Secondary | ICD-10-CM | POA: Diagnosis not present

## 2019-05-20 DIAGNOSIS — Z9181 History of falling: Secondary | ICD-10-CM | POA: Diagnosis not present

## 2019-05-20 DIAGNOSIS — M80051D Age-related osteoporosis with current pathological fracture, right femur, subsequent encounter for fracture with routine healing: Secondary | ICD-10-CM | POA: Diagnosis not present

## 2019-05-20 DIAGNOSIS — Z950 Presence of cardiac pacemaker: Secondary | ICD-10-CM | POA: Diagnosis not present

## 2019-05-20 DIAGNOSIS — N184 Chronic kidney disease, stage 4 (severe): Secondary | ICD-10-CM | POA: Diagnosis not present

## 2019-05-20 DIAGNOSIS — E039 Hypothyroidism, unspecified: Secondary | ICD-10-CM | POA: Diagnosis not present

## 2019-05-20 DIAGNOSIS — E785 Hyperlipidemia, unspecified: Secondary | ICD-10-CM | POA: Diagnosis not present

## 2019-05-20 DIAGNOSIS — I2510 Atherosclerotic heart disease of native coronary artery without angina pectoris: Secondary | ICD-10-CM | POA: Diagnosis not present

## 2019-05-20 DIAGNOSIS — I4819 Other persistent atrial fibrillation: Secondary | ICD-10-CM | POA: Diagnosis not present

## 2019-05-20 DIAGNOSIS — D500 Iron deficiency anemia secondary to blood loss (chronic): Secondary | ICD-10-CM | POA: Diagnosis not present

## 2019-05-20 DIAGNOSIS — Z7984 Long term (current) use of oral hypoglycemic drugs: Secondary | ICD-10-CM | POA: Diagnosis not present

## 2019-05-20 DIAGNOSIS — J449 Chronic obstructive pulmonary disease, unspecified: Secondary | ICD-10-CM | POA: Diagnosis not present

## 2019-05-20 DIAGNOSIS — E1122 Type 2 diabetes mellitus with diabetic chronic kidney disease: Secondary | ICD-10-CM | POA: Diagnosis not present

## 2019-05-20 DIAGNOSIS — M80021D Age-related osteoporosis with current pathological fracture, right humerus, subsequent encounter for fracture with routine healing: Secondary | ICD-10-CM | POA: Diagnosis not present

## 2019-05-20 DIAGNOSIS — Z7982 Long term (current) use of aspirin: Secondary | ICD-10-CM | POA: Diagnosis not present

## 2019-05-20 DIAGNOSIS — M1990 Unspecified osteoarthritis, unspecified site: Secondary | ICD-10-CM | POA: Diagnosis not present

## 2019-05-20 DIAGNOSIS — D631 Anemia in chronic kidney disease: Secondary | ICD-10-CM | POA: Diagnosis not present

## 2019-05-20 NOTE — Telephone Encounter (Signed)
Called and spoke with daughter and she wished to r/s 2/18 appts due to weather.  Appts moved as requested to 2/24

## 2019-05-21 ENCOUNTER — Ambulatory Visit: Payer: Medicare Other | Admitting: Family

## 2019-05-21 ENCOUNTER — Other Ambulatory Visit: Payer: Medicare Other

## 2019-05-21 ENCOUNTER — Ambulatory Visit: Payer: Medicare Other

## 2019-05-27 ENCOUNTER — Inpatient Hospital Stay (HOSPITAL_BASED_OUTPATIENT_CLINIC_OR_DEPARTMENT_OTHER): Payer: Medicare Other | Admitting: Family

## 2019-05-27 ENCOUNTER — Inpatient Hospital Stay: Payer: Medicare Other

## 2019-05-27 ENCOUNTER — Encounter: Payer: Self-pay | Admitting: Family

## 2019-05-27 ENCOUNTER — Inpatient Hospital Stay: Payer: Medicare Other | Attending: Hematology & Oncology

## 2019-05-27 ENCOUNTER — Other Ambulatory Visit: Payer: Self-pay

## 2019-05-27 VITALS — BP 152/60 | HR 88 | Temp 97.1°F | Resp 20 | Ht 63.0 in | Wt 142.4 lb

## 2019-05-27 DIAGNOSIS — R200 Anesthesia of skin: Secondary | ICD-10-CM | POA: Diagnosis not present

## 2019-05-27 DIAGNOSIS — R202 Paresthesia of skin: Secondary | ICD-10-CM | POA: Diagnosis not present

## 2019-05-27 DIAGNOSIS — K909 Intestinal malabsorption, unspecified: Secondary | ICD-10-CM | POA: Insufficient documentation

## 2019-05-27 DIAGNOSIS — D500 Iron deficiency anemia secondary to blood loss (chronic): Secondary | ICD-10-CM

## 2019-05-27 DIAGNOSIS — D631 Anemia in chronic kidney disease: Secondary | ICD-10-CM

## 2019-05-27 DIAGNOSIS — D508 Other iron deficiency anemias: Secondary | ICD-10-CM | POA: Diagnosis not present

## 2019-05-27 DIAGNOSIS — Z79899 Other long term (current) drug therapy: Secondary | ICD-10-CM | POA: Insufficient documentation

## 2019-05-27 LAB — CBC WITH DIFFERENTIAL (CANCER CENTER ONLY)
Abs Immature Granulocytes: 0.08 10*3/uL — ABNORMAL HIGH (ref 0.00–0.07)
Basophils Absolute: 0.1 10*3/uL (ref 0.0–0.1)
Basophils Relative: 1 %
Eosinophils Absolute: 0.1 10*3/uL (ref 0.0–0.5)
Eosinophils Relative: 2 %
HCT: 35 % — ABNORMAL LOW (ref 36.0–46.0)
Hemoglobin: 11 g/dL — ABNORMAL LOW (ref 12.0–15.0)
Immature Granulocytes: 1 %
Lymphocytes Relative: 25 %
Lymphs Abs: 2.3 10*3/uL (ref 0.7–4.0)
MCH: 26.3 pg (ref 26.0–34.0)
MCHC: 31.4 g/dL (ref 30.0–36.0)
MCV: 83.7 fL (ref 80.0–100.0)
Monocytes Absolute: 0.9 10*3/uL (ref 0.1–1.0)
Monocytes Relative: 10 %
Neutro Abs: 5.7 10*3/uL (ref 1.7–7.7)
Neutrophils Relative %: 61 %
Platelet Count: 200 10*3/uL (ref 150–400)
RBC: 4.18 MIL/uL (ref 3.87–5.11)
RDW: 15.9 % — ABNORMAL HIGH (ref 11.5–15.5)
WBC Count: 9.2 10*3/uL (ref 4.0–10.5)
nRBC: 0 % (ref 0.0–0.2)

## 2019-05-27 LAB — CMP (CANCER CENTER ONLY)
ALT: 19 U/L (ref 0–44)
AST: 26 U/L (ref 15–41)
Albumin: 4.4 g/dL (ref 3.5–5.0)
Alkaline Phosphatase: 126 U/L (ref 38–126)
Anion gap: 10 (ref 5–15)
BUN: 53 mg/dL — ABNORMAL HIGH (ref 8–23)
CO2: 28 mmol/L (ref 22–32)
Calcium: 10.1 mg/dL (ref 8.9–10.3)
Chloride: 100 mmol/L (ref 98–111)
Creatinine: 1.42 mg/dL — ABNORMAL HIGH (ref 0.44–1.00)
GFR, Est AFR Am: 39 mL/min — ABNORMAL LOW (ref >60)
GFR, Estimated: 34 mL/min — ABNORMAL LOW (ref >60)
Glucose, Bld: 105 mg/dL — ABNORMAL HIGH (ref 70–99)
Potassium: 4.5 mmol/L (ref 3.5–5.1)
Sodium: 138 mmol/L (ref 135–145)
Total Bilirubin: 0.4 mg/dL (ref 0.3–1.2)
Total Protein: 7.9 g/dL (ref 6.5–8.1)

## 2019-05-27 LAB — RETICULOCYTES
Immature Retic Fract: 10.8 % (ref 2.3–15.9)
RBC.: 4.21 MIL/uL (ref 3.87–5.11)
Retic Count, Absolute: 34.9 10*3/uL (ref 19.0–186.0)
Retic Ct Pct: 0.8 % (ref 0.4–3.1)

## 2019-05-27 NOTE — Progress Notes (Signed)
Hematology and Oncology Follow Up Visit  Ann Fletcher [id number] 1933/03/30 86 y.o. 05/27/2019   Principle Diagnosis: Iron deficiency anemia -- iron malabsorption and chronic blood loss Erythropoitin deficiencyanemia  Current Therapy:   IV iron as indicated Retacrit 40,000 unit SQ as indicated for Hgb < 11   Interim History:  Ann Fletcher is here today for follow-up. Hgb today is up to 11.0! She is healing nicely from Ann fall and doing PT for the right arm once a week on Friday's.  She is now ambulating with a rolling walker and doing well. No new falls, no syncopal episodes to report.  She states that Ann Fletcher is taking good care of Ann.  No fever, chills, n/v, cough, rash, dizziness, SOB, chest pain, palpitations, abdominal pain or changes in bowel or bladder habits.  She has some puffiness in the fingers of Ann right hands that waxes and wanes. She has numbness and tingling in Ann hands and fingers that comes and goes. This is unchanged.  She has maintained a good appetite and is staying well hydrated. Ann weight is stable.   ECOG Performance Status: 1 - Symptomatic but completely ambulatory  Medications:  Allergies as of 05/27/2019      Reactions   Pradaxa [dabigatran Etexilate Mesylate] Nausea And Vomiting   Nsaids Other (See Comments), Itching   Stomach cramps Stomach cramps   Sulfa Antibiotics Nausea And Vomiting, Other (See Comments)   GI upset   Tizanidine Hcl Other (See Comments)   Dizziness, patient reports fall   Betadine [povidone Iodine] Itching   Povidone-iodine Rash      Medication List       Accurate as of May 27, 2019  2:20 PM. If you have any questions, ask your nurse or doctor.        albuterol 108 (90 Base) MCG/ACT inhaler Commonly known as: VENTOLIN HFA Inhale 2 puffs into the lungs 3 (three) times daily as needed for wheezing or shortness of breath.   aspirin 81 MG chewable tablet Chew 81 mg by mouth daily.   atorvastatin 40  MG tablet Commonly known as: LIPITOR Take 40 mg by mouth at bedtime.   fluticasone 50 MCG/ACT nasal spray Commonly known as: FLONASE Place 1 spray into both nostrils daily as needed for allergies.   furosemide 80 MG tablet Commonly known as: LASIX TAKE ONE WHOLE TABLET (80MG ) IN THE MORNING AND HALF TABLET (40 MG) IN THE EVENING.   glimepiride 2 MG tablet Commonly known as: AMARYL Take 2 mg by mouth every morning.   levothyroxine 100 MCG tablet Commonly known as: SYNTHROID Take 100 mcg by mouth daily before breakfast.   LORazepam 1 MG tablet Commonly known as: ATIVAN Take 1 mg by mouth 3 (three) times daily as needed for anxiety.   OSCAL 500/200 D-3 PO Take 1 tablet by mouth daily.   potassium chloride SA 20 MEQ tablet Commonly known as: KLOR-CON Take 1 tablet (20 mEq total) by mouth daily.   traMADol 50 MG tablet Commonly known as: ULTRAM Take 50 mg by mouth every 8 (eight) hours as needed for moderate pain.   vitamin E 180 MG (400 UNITS) capsule Generic drug: vitamin E Take 400 Units by mouth daily.       Allergies:  Allergies  Allergen Reactions  . Pradaxa [Dabigatran Etexilate Mesylate] Nausea And Vomiting  . Nsaids Other (See Comments) and Itching    Stomach cramps Stomach cramps  . Sulfa Antibiotics Nausea And Vomiting and Other (See  Comments)    GI upset  . Tizanidine Hcl Other (See Comments)    Dizziness, patient reports fall  . Betadine [Povidone Iodine] Itching  . Povidone-Iodine Rash    Past Medical History, Surgical history, Social history, and Family History were reviewed and updated.  Review of Systems: All other 10 point review of systems is negative.   Physical Exam:  vitals were not taken for this visit.   Wt Readings from Last 3 Encounters:  04/20/19 142 lb (64.4 kg)  04/16/19 141 lb (64 kg)  03/11/19 153 lb (69.4 kg)    Ocular: Sclerae unicteric, pupils equal, round and reactive to light Ear-nose-throat: Oropharynx clear,  dentition fair Lymphatic: No cervical or supraclavicular adenopathy Lungs no rales or rhonchi, good excursion bilaterally Heart regular rate and rhythm, no murmur appreciated Abd soft, nontender, positive bowel sounds, no liver or spleen tip palpated on exam, no fluid wave  MSK no focal spinal tenderness, no joint edema Neuro: non-focal, well-oriented, appropriate affect Breasts: Deferred  Lab Results  Component Value Date   WBC 9.2 05/27/2019   HGB 11.0 (L) 05/27/2019   HCT 35.0 (L) 05/27/2019   MCV 83.7 05/27/2019   PLT 200 05/27/2019   Lab Results  Component Value Date   FERRITIN 716 (H) 04/20/2019   IRON 46 04/20/2019   TIBC 279 04/20/2019   UIBC 233 04/20/2019   IRONPCTSAT 17 (L) 04/20/2019   Lab Results  Component Value Date   RETICCTPCT 0.8 05/27/2019   RBC 4.21 05/27/2019   No results found for: KPAFRELGTCHN, LAMBDASER, KAPLAMBRATIO No results found for: IGGSERUM, IGA, IGMSERUM No results found for: Kathrynn Ducking, MSPIKE, SPEI   Chemistry      Component Value Date/Time   NA 141 04/20/2019 1304   NA 142 03/20/2019 1032   K 4.1 04/20/2019 1304   CL 103 04/20/2019 1304   CO2 29 04/20/2019 1304   BUN 58 (H) 04/20/2019 1304   BUN 43 (H) 03/20/2019 1032   CREATININE 1.44 (H) 04/20/2019 1304   CREATININE 1.15 (H) 10/29/2014 1608      Component Value Date/Time   CALCIUM 10.4 (H) 04/20/2019 1304   ALKPHOS 99 04/20/2019 1304   AST 23 04/20/2019 1304   ALT 16 04/20/2019 1304   BILITOT 0.4 04/20/2019 1304       Impression and Plan: Ann Fletcher is a very pleasant 86 yo caucasian female with both iron deficiency anderythropoietin deficiency anemias. No Aranesp needed this visit. Hgb is stable at 11.0.  We will see Ann back in another 3 weeks.  She will contact our office with any questions or concerns. We can certainly see Ann sooner if needed.   Ann Peace, NP 2/24/20212:20 PM

## 2019-05-28 LAB — IRON AND TIBC
Iron: 52 ug/dL (ref 41–142)
Saturation Ratios: 20 % — ABNORMAL LOW (ref 21–57)
TIBC: 263 ug/dL (ref 236–444)
UIBC: 210 ug/dL (ref 120–384)

## 2019-05-28 LAB — FERRITIN: Ferritin: 985 ng/mL — ABNORMAL HIGH (ref 11–307)

## 2019-05-29 DIAGNOSIS — Z950 Presence of cardiac pacemaker: Secondary | ICD-10-CM | POA: Diagnosis not present

## 2019-05-29 DIAGNOSIS — I2510 Atherosclerotic heart disease of native coronary artery without angina pectoris: Secondary | ICD-10-CM | POA: Diagnosis not present

## 2019-05-29 DIAGNOSIS — D500 Iron deficiency anemia secondary to blood loss (chronic): Secondary | ICD-10-CM | POA: Diagnosis not present

## 2019-05-29 DIAGNOSIS — D631 Anemia in chronic kidney disease: Secondary | ICD-10-CM | POA: Diagnosis not present

## 2019-05-29 DIAGNOSIS — Z7982 Long term (current) use of aspirin: Secondary | ICD-10-CM | POA: Diagnosis not present

## 2019-05-29 DIAGNOSIS — E1122 Type 2 diabetes mellitus with diabetic chronic kidney disease: Secondary | ICD-10-CM | POA: Diagnosis not present

## 2019-05-29 DIAGNOSIS — M80051D Age-related osteoporosis with current pathological fracture, right femur, subsequent encounter for fracture with routine healing: Secondary | ICD-10-CM | POA: Diagnosis not present

## 2019-05-29 DIAGNOSIS — E039 Hypothyroidism, unspecified: Secondary | ICD-10-CM | POA: Diagnosis not present

## 2019-05-29 DIAGNOSIS — Z7951 Long term (current) use of inhaled steroids: Secondary | ICD-10-CM | POA: Diagnosis not present

## 2019-05-29 DIAGNOSIS — N184 Chronic kidney disease, stage 4 (severe): Secondary | ICD-10-CM | POA: Diagnosis not present

## 2019-05-29 DIAGNOSIS — Z9181 History of falling: Secondary | ICD-10-CM | POA: Diagnosis not present

## 2019-05-29 DIAGNOSIS — Z7984 Long term (current) use of oral hypoglycemic drugs: Secondary | ICD-10-CM | POA: Diagnosis not present

## 2019-05-29 DIAGNOSIS — J449 Chronic obstructive pulmonary disease, unspecified: Secondary | ICD-10-CM | POA: Diagnosis not present

## 2019-05-29 DIAGNOSIS — J849 Interstitial pulmonary disease, unspecified: Secondary | ICD-10-CM | POA: Diagnosis not present

## 2019-05-29 DIAGNOSIS — I5033 Acute on chronic diastolic (congestive) heart failure: Secondary | ICD-10-CM | POA: Diagnosis not present

## 2019-05-29 DIAGNOSIS — I4819 Other persistent atrial fibrillation: Secondary | ICD-10-CM | POA: Diagnosis not present

## 2019-05-29 DIAGNOSIS — E785 Hyperlipidemia, unspecified: Secondary | ICD-10-CM | POA: Diagnosis not present

## 2019-05-29 DIAGNOSIS — M80021D Age-related osteoporosis with current pathological fracture, right humerus, subsequent encounter for fracture with routine healing: Secondary | ICD-10-CM | POA: Diagnosis not present

## 2019-05-29 DIAGNOSIS — M1990 Unspecified osteoarthritis, unspecified site: Secondary | ICD-10-CM | POA: Diagnosis not present

## 2019-05-29 DIAGNOSIS — I130 Hypertensive heart and chronic kidney disease with heart failure and stage 1 through stage 4 chronic kidney disease, or unspecified chronic kidney disease: Secondary | ICD-10-CM | POA: Diagnosis not present

## 2019-06-01 ENCOUNTER — Other Ambulatory Visit: Payer: Self-pay | Admitting: Family

## 2019-06-02 ENCOUNTER — Inpatient Hospital Stay: Payer: Medicare Other | Attending: Hematology & Oncology

## 2019-06-02 ENCOUNTER — Ambulatory Visit (INDEPENDENT_AMBULATORY_CARE_PROVIDER_SITE_OTHER): Payer: Medicare Other | Admitting: *Deleted

## 2019-06-02 ENCOUNTER — Other Ambulatory Visit: Payer: Self-pay

## 2019-06-02 VITALS — BP 170/62 | HR 73 | Temp 97.3°F | Resp 16

## 2019-06-02 DIAGNOSIS — D500 Iron deficiency anemia secondary to blood loss (chronic): Secondary | ICD-10-CM | POA: Diagnosis not present

## 2019-06-02 DIAGNOSIS — Z950 Presence of cardiac pacemaker: Secondary | ICD-10-CM | POA: Diagnosis not present

## 2019-06-02 DIAGNOSIS — R202 Paresthesia of skin: Secondary | ICD-10-CM | POA: Diagnosis not present

## 2019-06-02 DIAGNOSIS — Z79899 Other long term (current) drug therapy: Secondary | ICD-10-CM | POA: Insufficient documentation

## 2019-06-02 LAB — CUP PACEART REMOTE DEVICE CHECK
Battery Remaining Longevity: 121 mo
Battery Voltage: 3.02 V
Brady Statistic AP VP Percent: 0 %
Brady Statistic AP VS Percent: 0 %
Brady Statistic AS VP Percent: 99.95 %
Brady Statistic AS VS Percent: 0.05 %
Brady Statistic RA Percent Paced: 0 %
Brady Statistic RV Percent Paced: 99.95 %
Date Time Interrogation Session: 20210301234003
Implantable Lead Implant Date: 20190524
Implantable Lead Implant Date: 20190524
Implantable Lead Location: 753859
Implantable Lead Location: 753860
Implantable Lead Model: 3830
Implantable Lead Model: 5076
Implantable Pulse Generator Implant Date: 20190524
Lead Channel Impedance Value: 361 Ohm
Lead Channel Impedance Value: 361 Ohm
Lead Channel Impedance Value: 475 Ohm
Lead Channel Impedance Value: 532 Ohm
Lead Channel Pacing Threshold Amplitude: 0.875 V
Lead Channel Pacing Threshold Pulse Width: 0.4 ms
Lead Channel Sensing Intrinsic Amplitude: 1.5 mV
Lead Channel Sensing Intrinsic Amplitude: 13 mV
Lead Channel Sensing Intrinsic Amplitude: 13 mV
Lead Channel Setting Pacing Amplitude: 2.5 V
Lead Channel Setting Pacing Pulse Width: 0.4 ms
Lead Channel Setting Sensing Sensitivity: 4 mV

## 2019-06-02 MED ORDER — SODIUM CHLORIDE 0.9 % IV SOLN
Freq: Once | INTRAVENOUS | Status: AC
  Filled 2019-06-02: qty 250

## 2019-06-02 MED ORDER — SODIUM CHLORIDE 0.9 % IV SOLN
200.0000 mg | Freq: Once | INTRAVENOUS | Status: AC
  Administered 2019-06-02: 200 mg via INTRAVENOUS
  Filled 2019-06-02: qty 200

## 2019-06-02 NOTE — Patient Instructions (Signed)

## 2019-06-03 DIAGNOSIS — E1122 Type 2 diabetes mellitus with diabetic chronic kidney disease: Secondary | ICD-10-CM | POA: Diagnosis not present

## 2019-06-03 DIAGNOSIS — I130 Hypertensive heart and chronic kidney disease with heart failure and stage 1 through stage 4 chronic kidney disease, or unspecified chronic kidney disease: Secondary | ICD-10-CM | POA: Diagnosis not present

## 2019-06-03 DIAGNOSIS — I5033 Acute on chronic diastolic (congestive) heart failure: Secondary | ICD-10-CM | POA: Diagnosis not present

## 2019-06-03 DIAGNOSIS — D500 Iron deficiency anemia secondary to blood loss (chronic): Secondary | ICD-10-CM | POA: Diagnosis not present

## 2019-06-03 DIAGNOSIS — M80051D Age-related osteoporosis with current pathological fracture, right femur, subsequent encounter for fracture with routine healing: Secondary | ICD-10-CM | POA: Diagnosis not present

## 2019-06-03 DIAGNOSIS — M1990 Unspecified osteoarthritis, unspecified site: Secondary | ICD-10-CM | POA: Diagnosis not present

## 2019-06-03 DIAGNOSIS — Z950 Presence of cardiac pacemaker: Secondary | ICD-10-CM | POA: Diagnosis not present

## 2019-06-03 DIAGNOSIS — M80021D Age-related osteoporosis with current pathological fracture, right humerus, subsequent encounter for fracture with routine healing: Secondary | ICD-10-CM | POA: Diagnosis not present

## 2019-06-03 DIAGNOSIS — Z7951 Long term (current) use of inhaled steroids: Secondary | ICD-10-CM | POA: Diagnosis not present

## 2019-06-03 DIAGNOSIS — J449 Chronic obstructive pulmonary disease, unspecified: Secondary | ICD-10-CM | POA: Diagnosis not present

## 2019-06-03 DIAGNOSIS — Z7984 Long term (current) use of oral hypoglycemic drugs: Secondary | ICD-10-CM | POA: Diagnosis not present

## 2019-06-03 DIAGNOSIS — D631 Anemia in chronic kidney disease: Secondary | ICD-10-CM | POA: Diagnosis not present

## 2019-06-03 DIAGNOSIS — Z9181 History of falling: Secondary | ICD-10-CM | POA: Diagnosis not present

## 2019-06-03 DIAGNOSIS — N184 Chronic kidney disease, stage 4 (severe): Secondary | ICD-10-CM | POA: Diagnosis not present

## 2019-06-03 DIAGNOSIS — I2510 Atherosclerotic heart disease of native coronary artery without angina pectoris: Secondary | ICD-10-CM | POA: Diagnosis not present

## 2019-06-03 DIAGNOSIS — Z7982 Long term (current) use of aspirin: Secondary | ICD-10-CM | POA: Diagnosis not present

## 2019-06-03 DIAGNOSIS — E785 Hyperlipidemia, unspecified: Secondary | ICD-10-CM | POA: Diagnosis not present

## 2019-06-03 DIAGNOSIS — E039 Hypothyroidism, unspecified: Secondary | ICD-10-CM | POA: Diagnosis not present

## 2019-06-03 DIAGNOSIS — J849 Interstitial pulmonary disease, unspecified: Secondary | ICD-10-CM | POA: Diagnosis not present

## 2019-06-03 DIAGNOSIS — I4819 Other persistent atrial fibrillation: Secondary | ICD-10-CM | POA: Diagnosis not present

## 2019-06-03 NOTE — Progress Notes (Signed)
PPM Remote  

## 2019-06-05 ENCOUNTER — Other Ambulatory Visit: Payer: Self-pay

## 2019-06-05 ENCOUNTER — Inpatient Hospital Stay: Payer: Medicare Other

## 2019-06-05 VITALS — BP 156/52 | HR 70 | Temp 97.1°F | Resp 20

## 2019-06-05 DIAGNOSIS — D500 Iron deficiency anemia secondary to blood loss (chronic): Secondary | ICD-10-CM | POA: Diagnosis not present

## 2019-06-05 DIAGNOSIS — R202 Paresthesia of skin: Secondary | ICD-10-CM | POA: Diagnosis not present

## 2019-06-05 DIAGNOSIS — Z79899 Other long term (current) drug therapy: Secondary | ICD-10-CM | POA: Diagnosis not present

## 2019-06-05 MED ORDER — SODIUM CHLORIDE 0.9 % IV SOLN
200.0000 mg | Freq: Once | INTRAVENOUS | Status: AC
  Administered 2019-06-05: 200 mg via INTRAVENOUS
  Filled 2019-06-05: qty 200

## 2019-06-05 MED ORDER — SODIUM CHLORIDE 0.9 % IV SOLN
Freq: Once | INTRAVENOUS | Status: AC
  Filled 2019-06-05: qty 250

## 2019-06-05 NOTE — Patient Instructions (Signed)

## 2019-06-05 NOTE — Progress Notes (Signed)
1:55 PM States had bloody stool this morning.  [facility] notified.

## 2019-06-08 ENCOUNTER — Other Ambulatory Visit: Payer: Self-pay | Admitting: Family

## 2019-06-08 DIAGNOSIS — D500 Iron deficiency anemia secondary to blood loss (chronic): Secondary | ICD-10-CM

## 2019-06-09 DIAGNOSIS — M1990 Unspecified osteoarthritis, unspecified site: Secondary | ICD-10-CM | POA: Diagnosis not present

## 2019-06-09 DIAGNOSIS — M80021D Age-related osteoporosis with current pathological fracture, right humerus, subsequent encounter for fracture with routine healing: Secondary | ICD-10-CM | POA: Diagnosis not present

## 2019-06-09 DIAGNOSIS — I130 Hypertensive heart and chronic kidney disease with heart failure and stage 1 through stage 4 chronic kidney disease, or unspecified chronic kidney disease: Secondary | ICD-10-CM | POA: Diagnosis not present

## 2019-06-09 DIAGNOSIS — E1122 Type 2 diabetes mellitus with diabetic chronic kidney disease: Secondary | ICD-10-CM | POA: Diagnosis not present

## 2019-06-09 DIAGNOSIS — Z7984 Long term (current) use of oral hypoglycemic drugs: Secondary | ICD-10-CM | POA: Diagnosis not present

## 2019-06-09 DIAGNOSIS — Z9181 History of falling: Secondary | ICD-10-CM | POA: Diagnosis not present

## 2019-06-09 DIAGNOSIS — J449 Chronic obstructive pulmonary disease, unspecified: Secondary | ICD-10-CM | POA: Diagnosis not present

## 2019-06-09 DIAGNOSIS — Z7982 Long term (current) use of aspirin: Secondary | ICD-10-CM | POA: Diagnosis not present

## 2019-06-09 DIAGNOSIS — J849 Interstitial pulmonary disease, unspecified: Secondary | ICD-10-CM | POA: Diagnosis not present

## 2019-06-09 DIAGNOSIS — E785 Hyperlipidemia, unspecified: Secondary | ICD-10-CM | POA: Diagnosis not present

## 2019-06-09 DIAGNOSIS — D500 Iron deficiency anemia secondary to blood loss (chronic): Secondary | ICD-10-CM | POA: Diagnosis not present

## 2019-06-09 DIAGNOSIS — Z7951 Long term (current) use of inhaled steroids: Secondary | ICD-10-CM | POA: Diagnosis not present

## 2019-06-09 DIAGNOSIS — I4819 Other persistent atrial fibrillation: Secondary | ICD-10-CM | POA: Diagnosis not present

## 2019-06-09 DIAGNOSIS — N184 Chronic kidney disease, stage 4 (severe): Secondary | ICD-10-CM | POA: Diagnosis not present

## 2019-06-09 DIAGNOSIS — D631 Anemia in chronic kidney disease: Secondary | ICD-10-CM | POA: Diagnosis not present

## 2019-06-09 DIAGNOSIS — I5033 Acute on chronic diastolic (congestive) heart failure: Secondary | ICD-10-CM | POA: Diagnosis not present

## 2019-06-09 DIAGNOSIS — Z950 Presence of cardiac pacemaker: Secondary | ICD-10-CM | POA: Diagnosis not present

## 2019-06-09 DIAGNOSIS — I2510 Atherosclerotic heart disease of native coronary artery without angina pectoris: Secondary | ICD-10-CM | POA: Diagnosis not present

## 2019-06-09 DIAGNOSIS — M80051D Age-related osteoporosis with current pathological fracture, right femur, subsequent encounter for fracture with routine healing: Secondary | ICD-10-CM | POA: Diagnosis not present

## 2019-06-09 DIAGNOSIS — E039 Hypothyroidism, unspecified: Secondary | ICD-10-CM | POA: Diagnosis not present

## 2019-06-10 ENCOUNTER — Encounter: Payer: Self-pay | Admitting: Podiatry

## 2019-06-10 ENCOUNTER — Other Ambulatory Visit: Payer: Self-pay

## 2019-06-10 ENCOUNTER — Ambulatory Visit (INDEPENDENT_AMBULATORY_CARE_PROVIDER_SITE_OTHER): Payer: Medicare Other | Admitting: Podiatry

## 2019-06-10 VITALS — Temp 98.4°F

## 2019-06-10 DIAGNOSIS — E119 Type 2 diabetes mellitus without complications: Secondary | ICD-10-CM

## 2019-06-10 DIAGNOSIS — B351 Tinea unguium: Secondary | ICD-10-CM

## 2019-06-10 DIAGNOSIS — M79676 Pain in unspecified toe(s): Secondary | ICD-10-CM | POA: Diagnosis not present

## 2019-06-10 DIAGNOSIS — D689 Coagulation defect, unspecified: Secondary | ICD-10-CM | POA: Diagnosis not present

## 2019-06-10 NOTE — Progress Notes (Signed)
This patient returns to my office for at risk foot care.  This patient requires this care by a professional since this patient will be at risk due to having kidney disease and  Diabetes.   Patient is taking eliquiss. This patient is unable to cut nails themselves since the patient cannot reach their nails.These nails are painful walking and wearing shoes.  This patient presents for at risk foot care today.  General Appearance  Alert, conversant and in no acute stress.  Vascular  Dorsalis pedis and posterior tibial  pulses are palpable  bilaterally.  Capillary return is within normal limits  bilaterally. Temperature is within normal limits  bilaterally.  Neurologic  Senn-Weinstein monofilament wire test within normal limits  bilaterally. Muscle power within normal limits bilaterally.  Nails Thick disfigured discolored nails with subungual debris  from second  to fifth toes bilaterally. No evidence of bacterial infection or drainage bilaterally.  Orthopedic  No limitations of motion  feet .  No crepitus or effusions noted.  No bony pathology or digital deformities noted.  HAV  B/L.  Hammer toe second left.  Skin  normotropic skin with no porokeratosis noted bilaterally.  No signs of infections or ulcers noted.     Onychomycosis  Pain in right toes  Pain in left toes  Consent was obtained for treatment procedures.   Mechanical debridement of nails 1-5  bilaterally performed with a nail nipper.  Filed with dremel without incident. No infection or ulcer.     Return office visit 3 months.         Told patient to return for periodic foot care and evaluation due to potential at risk complications.   Gardiner Barefoot DPM

## 2019-06-16 DIAGNOSIS — Z7982 Long term (current) use of aspirin: Secondary | ICD-10-CM | POA: Diagnosis not present

## 2019-06-16 DIAGNOSIS — E1121 Type 2 diabetes mellitus with diabetic nephropathy: Secondary | ICD-10-CM | POA: Diagnosis not present

## 2019-06-16 DIAGNOSIS — E785 Hyperlipidemia, unspecified: Secondary | ICD-10-CM | POA: Diagnosis not present

## 2019-06-16 DIAGNOSIS — Z9181 History of falling: Secondary | ICD-10-CM | POA: Diagnosis not present

## 2019-06-16 DIAGNOSIS — J849 Interstitial pulmonary disease, unspecified: Secondary | ICD-10-CM | POA: Diagnosis not present

## 2019-06-16 DIAGNOSIS — E782 Mixed hyperlipidemia: Secondary | ICD-10-CM | POA: Diagnosis not present

## 2019-06-16 DIAGNOSIS — I5033 Acute on chronic diastolic (congestive) heart failure: Secondary | ICD-10-CM | POA: Diagnosis not present

## 2019-06-16 DIAGNOSIS — I2510 Atherosclerotic heart disease of native coronary artery without angina pectoris: Secondary | ICD-10-CM | POA: Diagnosis not present

## 2019-06-16 DIAGNOSIS — I129 Hypertensive chronic kidney disease with stage 1 through stage 4 chronic kidney disease, or unspecified chronic kidney disease: Secondary | ICD-10-CM | POA: Diagnosis not present

## 2019-06-16 DIAGNOSIS — Z7951 Long term (current) use of inhaled steroids: Secondary | ICD-10-CM | POA: Diagnosis not present

## 2019-06-16 DIAGNOSIS — M80051D Age-related osteoporosis with current pathological fracture, right femur, subsequent encounter for fracture with routine healing: Secondary | ICD-10-CM | POA: Diagnosis not present

## 2019-06-16 DIAGNOSIS — Z7984 Long term (current) use of oral hypoglycemic drugs: Secondary | ICD-10-CM | POA: Diagnosis not present

## 2019-06-16 DIAGNOSIS — I130 Hypertensive heart and chronic kidney disease with heart failure and stage 1 through stage 4 chronic kidney disease, or unspecified chronic kidney disease: Secondary | ICD-10-CM | POA: Diagnosis not present

## 2019-06-16 DIAGNOSIS — M1990 Unspecified osteoarthritis, unspecified site: Secondary | ICD-10-CM | POA: Diagnosis not present

## 2019-06-16 DIAGNOSIS — J449 Chronic obstructive pulmonary disease, unspecified: Secondary | ICD-10-CM | POA: Diagnosis not present

## 2019-06-16 DIAGNOSIS — N184 Chronic kidney disease, stage 4 (severe): Secondary | ICD-10-CM | POA: Diagnosis not present

## 2019-06-16 DIAGNOSIS — I4819 Other persistent atrial fibrillation: Secondary | ICD-10-CM | POA: Diagnosis not present

## 2019-06-16 DIAGNOSIS — Z79899 Other long term (current) drug therapy: Secondary | ICD-10-CM | POA: Diagnosis not present

## 2019-06-16 DIAGNOSIS — E039 Hypothyroidism, unspecified: Secondary | ICD-10-CM | POA: Diagnosis not present

## 2019-06-16 DIAGNOSIS — E1165 Type 2 diabetes mellitus with hyperglycemia: Secondary | ICD-10-CM | POA: Diagnosis not present

## 2019-06-16 DIAGNOSIS — E1122 Type 2 diabetes mellitus with diabetic chronic kidney disease: Secondary | ICD-10-CM | POA: Diagnosis not present

## 2019-06-16 DIAGNOSIS — Z950 Presence of cardiac pacemaker: Secondary | ICD-10-CM | POA: Diagnosis not present

## 2019-06-16 DIAGNOSIS — D631 Anemia in chronic kidney disease: Secondary | ICD-10-CM | POA: Diagnosis not present

## 2019-06-16 DIAGNOSIS — D500 Iron deficiency anemia secondary to blood loss (chronic): Secondary | ICD-10-CM | POA: Diagnosis not present

## 2019-06-16 DIAGNOSIS — M80021D Age-related osteoporosis with current pathological fracture, right humerus, subsequent encounter for fracture with routine healing: Secondary | ICD-10-CM | POA: Diagnosis not present

## 2019-06-17 ENCOUNTER — Inpatient Hospital Stay (HOSPITAL_BASED_OUTPATIENT_CLINIC_OR_DEPARTMENT_OTHER): Payer: Medicare Other | Admitting: Family

## 2019-06-17 ENCOUNTER — Other Ambulatory Visit: Payer: Self-pay

## 2019-06-17 ENCOUNTER — Encounter: Payer: Self-pay | Admitting: Family

## 2019-06-17 ENCOUNTER — Inpatient Hospital Stay: Payer: Medicare Other

## 2019-06-17 VITALS — BP 162/68 | HR 86 | Temp 97.1°F | Resp 20 | Wt 142.8 lb

## 2019-06-17 DIAGNOSIS — R202 Paresthesia of skin: Secondary | ICD-10-CM | POA: Diagnosis not present

## 2019-06-17 DIAGNOSIS — D631 Anemia in chronic kidney disease: Secondary | ICD-10-CM

## 2019-06-17 DIAGNOSIS — D500 Iron deficiency anemia secondary to blood loss (chronic): Secondary | ICD-10-CM

## 2019-06-17 DIAGNOSIS — Z79899 Other long term (current) drug therapy: Secondary | ICD-10-CM | POA: Diagnosis not present

## 2019-06-17 LAB — CBC WITH DIFFERENTIAL (CANCER CENTER ONLY)
Abs Immature Granulocytes: 0.03 10*3/uL (ref 0.00–0.07)
Basophils Absolute: 0.1 10*3/uL (ref 0.0–0.1)
Basophils Relative: 1 %
Eosinophils Absolute: 0.2 10*3/uL (ref 0.0–0.5)
Eosinophils Relative: 2 %
HCT: 35.6 % — ABNORMAL LOW (ref 36.0–46.0)
Hemoglobin: 11.2 g/dL — ABNORMAL LOW (ref 12.0–15.0)
Immature Granulocytes: 0 %
Lymphocytes Relative: 29 %
Lymphs Abs: 2.6 10*3/uL (ref 0.7–4.0)
MCH: 26.6 pg (ref 26.0–34.0)
MCHC: 31.5 g/dL (ref 30.0–36.0)
MCV: 84.6 fL (ref 80.0–100.0)
Monocytes Absolute: 0.9 10*3/uL (ref 0.1–1.0)
Monocytes Relative: 10 %
Neutro Abs: 5.2 10*3/uL (ref 1.7–7.7)
Neutrophils Relative %: 58 %
Platelet Count: 214 10*3/uL (ref 150–400)
RBC: 4.21 MIL/uL (ref 3.87–5.11)
RDW: 16.7 % — ABNORMAL HIGH (ref 11.5–15.5)
WBC Count: 8.9 10*3/uL (ref 4.0–10.5)
nRBC: 0 % (ref 0.0–0.2)

## 2019-06-17 LAB — CMP (CANCER CENTER ONLY)
ALT: 15 U/L (ref 0–44)
AST: 23 U/L (ref 15–41)
Albumin: 4.6 g/dL (ref 3.5–5.0)
Alkaline Phosphatase: 104 U/L (ref 38–126)
Anion gap: 10 (ref 5–15)
BUN: 49 mg/dL — ABNORMAL HIGH (ref 8–23)
CO2: 28 mmol/L (ref 22–32)
Calcium: 10.4 mg/dL — ABNORMAL HIGH (ref 8.9–10.3)
Chloride: 102 mmol/L (ref 98–111)
Creatinine: 1.35 mg/dL — ABNORMAL HIGH (ref 0.44–1.00)
GFR, Est AFR Am: 42 mL/min — ABNORMAL LOW (ref >60)
GFR, Estimated: 36 mL/min — ABNORMAL LOW (ref >60)
Glucose, Bld: 88 mg/dL (ref 70–99)
Potassium: 4.5 mmol/L (ref 3.5–5.1)
Sodium: 140 mmol/L (ref 135–145)
Total Bilirubin: 0.4 mg/dL (ref 0.3–1.2)
Total Protein: 7.9 g/dL (ref 6.5–8.1)

## 2019-06-17 NOTE — Progress Notes (Signed)
Hematology and Oncology Follow Up Visit  Ann Fletcher [id number] [birth year] 86 y.o. 06/17/2019   Principle Diagnosis:  Iron deficiency anemia -- iron malabsorption and chronic blood loss Erythropoitin deficiencyanemia  Current Therapy:   IV iron as indicated Retacrit 40,000 unit SQ as indicatedfor Hgb < 11   Interim History:  [name] is here today for follow-up. She is doing well and has no complaints at this time.  She has responded nicely to the IV iron and Hgb is up to 11.2.  She has not noted any bleeding. No bruising or petechiae.  She still has tenderness in her right shoulder and clavicle. She has finished PT and is home with her sweet husband who helps her.  No fever, chills, n/v, cough, rash, dizziness, SOB, chest pain, palpitations, abdominal pain or changes in bowel or bladder habits.  No swelling or tenderness in her extremities.  She has tingling in her fingertips that waxes and wanes.  No new falls. No syncope.  She is ambulating with a rolling walker with seat for added support.  She states that she is eating well and staying hydrated. Her weight is stable.   ECOG Performance Status: 2 - Symptomatic, <50% confined to bed  Medications:  Allergies as of 06/17/2019      Reactions   Tizanidine Hcl Other (See Comments)   Dizziness, patient reports fall   Nsaids Itching, Other (See Comments)   Stomach cramps   Pradaxa [dabigatran Etexilate Mesylate] Nausea And Vomiting   Sulfa Antibiotics Nausea And Vomiting   Betadine [povidone Iodine] Itching   Povidone-iodine Rash      Medication List       Accurate as of June 17, 2019  2:51 PM. If you have any questions, ask your nurse or doctor.        albuterol 108 (90 Base) MCG/ACT inhaler Commonly known as: VENTOLIN HFA Inhale 2 puffs into the lungs 3 (three) times daily as needed for wheezing or shortness of breath.   aspirin 81 MG chewable tablet Chew 81 mg by mouth daily.   atorvastatin 40 MG  tablet Commonly known as: LIPITOR Take 40 mg by mouth at bedtime.   fluticasone 50 MCG/ACT nasal spray Commonly known as: FLONASE Place 1 spray into both nostrils daily as needed for allergies.   furosemide 80 MG tablet Commonly known as: LASIX TAKE ONE WHOLE TABLET (80MG ) IN THE MORNING AND HALF TABLET (40 MG) IN THE EVENING.   glimepiride 2 MG tablet Commonly known as: AMARYL Take 2 mg by mouth every morning.   levothyroxine 100 MCG tablet Commonly known as: SYNTHROID Take 100 mcg by mouth daily before breakfast.   LORazepam 1 MG tablet Commonly known as: ATIVAN Take 1 mg by mouth 3 (three) times daily as needed for anxiety.   OSCAL 500/200 D-3 PO Take 1 tablet by mouth daily.   potassium chloride SA 20 MEQ tablet Commonly known as: KLOR-CON Take 1 tablet (20 mEq total) by mouth daily.   traMADol 50 MG tablet Commonly known as: ULTRAM Take 50 mg by mouth every 8 (eight) hours as needed for moderate pain.   vitamin E 180 MG (400 UNITS) capsule Generic drug: vitamin E Take 400 Units by mouth daily.       Allergies:  Allergies  Allergen Reactions  . Tizanidine Hcl Other (See Comments)    Dizziness, patient reports fall  . Nsaids Itching and Other (See Comments)    Stomach cramps   . Pradaxa [Dabigatran Etexilate Mesylate]  Nausea And Vomiting  . Sulfa Antibiotics Nausea And Vomiting  . Betadine [Povidone Iodine] Itching  . Povidone-Iodine Rash    Past Medical History, Surgical history, Social history, and Family History were reviewed and updated.  Review of Systems: All other 10 point review of systems is negative.   Physical Exam:  vitals were not taken for this visit.   Wt Readings from Last 3 Encounters:  05/27/19 142 lb 6.4 oz (64.6 kg)  04/20/19 142 lb (64.4 kg)  04/16/19 141 lb (64 kg)    Ocular: Sclerae unicteric, pupils equal, round and reactive to light Ear-nose-throat: Oropharynx clear, dentition fair Lymphatic: No cervical or  supraclavicular adenopathy Lungs no rales or rhonchi, good excursion bilaterally Heart regular rate and rhythm, no murmur appreciated Abd soft, nontender, positive bowel sounds, no liver or spleen tip palpated on exam, no fluid wave  MSK no focal spinal tenderness, no joint edema Neuro: non-focal, well-oriented, appropriate affect Breasts: Deferred   Lab Results  Component Value Date   WBC 9.2 05/27/2019   HGB 11.0 (L) 05/27/2019   HCT 35.0 (L) 05/27/2019   MCV 83.7 05/27/2019   PLT 200 05/27/2019   Lab Results  Component Value Date   FERRITIN 985 (H) 05/27/2019   IRON 52 05/27/2019   TIBC 263 05/27/2019   UIBC 210 05/27/2019   IRONPCTSAT 20 (L) 05/27/2019   Lab Results  Component Value Date   RETICCTPCT 0.8 05/27/2019   RBC 4.21 05/27/2019   No results found for: KPAFRELGTCHN, LAMBDASER, KAPLAMBRATIO No results found for: IGGSERUM, IGA, IGMSERUM No results found for: Odetta Pink, SPEI   Chemistry      Component Value Date/Time   NA 138 05/27/2019 1402   NA 142 03/20/2019 1032   K 4.5 05/27/2019 1402   CL 100 05/27/2019 1402   CO2 28 05/27/2019 1402   BUN 53 (H) 05/27/2019 1402   BUN 43 (H) 03/20/2019 1032   CREATININE 1.42 (H) 05/27/2019 1402   CREATININE 1.15 (H) 10/29/2014 1608      Component Value Date/Time   CALCIUM 10.1 05/27/2019 1402   ALKPHOS 126 05/27/2019 1402   AST 26 05/27/2019 1402   ALT 19 05/27/2019 1402   BILITOT 0.4 05/27/2019 1402       Impression and Plan: [name] is a very pleasant 86 yo caucasian female with both iron deficiency anderythropoietin deficiency anemias. No Retacrit needed today, Hgb is 10.2.  Iron studies are pending. We will replace if needed.  We will plan to see her again in 3 weeks.  She will contact our office with any questions or concerns. We can certainly see her sooner if needed.   [name], NP 3/17/20212:51 PM

## 2019-06-18 ENCOUNTER — Telehealth: Payer: Self-pay | Admitting: Hematology & Oncology

## 2019-06-18 LAB — IRON AND TIBC
Iron: 50 ug/dL (ref 41–142)
Saturation Ratios: 18 % — ABNORMAL LOW (ref 21–57)
TIBC: 278 ug/dL (ref 236–444)
UIBC: 228 ug/dL (ref 120–384)

## 2019-06-18 LAB — FERRITIN: Ferritin: 1258 ng/mL — ABNORMAL HIGH (ref 11–307)

## 2019-06-18 NOTE — Telephone Encounter (Signed)
Appointments scheduled calendar mailed per 3/18 los

## 2019-06-22 ENCOUNTER — Telehealth: Payer: Self-pay | Admitting: Family

## 2019-06-22 ENCOUNTER — Other Ambulatory Visit: Payer: Self-pay | Admitting: Family

## 2019-06-22 NOTE — Telephone Encounter (Signed)
Called and advised Ann Fletcher of appointments added per 3/22 sch msg.  She was ok with all dates/times

## 2019-06-23 DIAGNOSIS — I129 Hypertensive chronic kidney disease with stage 1 through stage 4 chronic kidney disease, or unspecified chronic kidney disease: Secondary | ICD-10-CM | POA: Diagnosis not present

## 2019-06-23 DIAGNOSIS — Z0000 Encounter for general adult medical examination without abnormal findings: Secondary | ICD-10-CM | POA: Diagnosis not present

## 2019-06-23 DIAGNOSIS — R319 Hematuria, unspecified: Secondary | ICD-10-CM | POA: Diagnosis not present

## 2019-06-23 DIAGNOSIS — E1121 Type 2 diabetes mellitus with diabetic nephropathy: Secondary | ICD-10-CM | POA: Diagnosis not present

## 2019-06-23 DIAGNOSIS — E1165 Type 2 diabetes mellitus with hyperglycemia: Secondary | ICD-10-CM | POA: Diagnosis not present

## 2019-06-23 DIAGNOSIS — N390 Urinary tract infection, site not specified: Secondary | ICD-10-CM | POA: Diagnosis not present

## 2019-06-23 DIAGNOSIS — I130 Hypertensive heart and chronic kidney disease with heart failure and stage 1 through stage 4 chronic kidney disease, or unspecified chronic kidney disease: Secondary | ICD-10-CM | POA: Diagnosis not present

## 2019-06-24 DIAGNOSIS — Z7984 Long term (current) use of oral hypoglycemic drugs: Secondary | ICD-10-CM | POA: Diagnosis not present

## 2019-06-24 DIAGNOSIS — E785 Hyperlipidemia, unspecified: Secondary | ICD-10-CM | POA: Diagnosis not present

## 2019-06-24 DIAGNOSIS — Z950 Presence of cardiac pacemaker: Secondary | ICD-10-CM | POA: Diagnosis not present

## 2019-06-24 DIAGNOSIS — J449 Chronic obstructive pulmonary disease, unspecified: Secondary | ICD-10-CM | POA: Diagnosis not present

## 2019-06-24 DIAGNOSIS — J849 Interstitial pulmonary disease, unspecified: Secondary | ICD-10-CM | POA: Diagnosis not present

## 2019-06-24 DIAGNOSIS — Z7951 Long term (current) use of inhaled steroids: Secondary | ICD-10-CM | POA: Diagnosis not present

## 2019-06-24 DIAGNOSIS — D509 Iron deficiency anemia, unspecified: Secondary | ICD-10-CM | POA: Diagnosis not present

## 2019-06-24 DIAGNOSIS — I130 Hypertensive heart and chronic kidney disease with heart failure and stage 1 through stage 4 chronic kidney disease, or unspecified chronic kidney disease: Secondary | ICD-10-CM | POA: Diagnosis not present

## 2019-06-24 DIAGNOSIS — D631 Anemia in chronic kidney disease: Secondary | ICD-10-CM | POA: Diagnosis not present

## 2019-06-24 DIAGNOSIS — I4819 Other persistent atrial fibrillation: Secondary | ICD-10-CM | POA: Diagnosis not present

## 2019-06-24 DIAGNOSIS — I5033 Acute on chronic diastolic (congestive) heart failure: Secondary | ICD-10-CM | POA: Diagnosis not present

## 2019-06-24 DIAGNOSIS — Z9181 History of falling: Secondary | ICD-10-CM | POA: Diagnosis not present

## 2019-06-24 DIAGNOSIS — M80021D Age-related osteoporosis with current pathological fracture, right humerus, subsequent encounter for fracture with routine healing: Secondary | ICD-10-CM | POA: Diagnosis not present

## 2019-06-24 DIAGNOSIS — N184 Chronic kidney disease, stage 4 (severe): Secondary | ICD-10-CM | POA: Diagnosis not present

## 2019-06-24 DIAGNOSIS — I2510 Atherosclerotic heart disease of native coronary artery without angina pectoris: Secondary | ICD-10-CM | POA: Diagnosis not present

## 2019-06-24 DIAGNOSIS — Z7982 Long term (current) use of aspirin: Secondary | ICD-10-CM | POA: Diagnosis not present

## 2019-06-24 DIAGNOSIS — E039 Hypothyroidism, unspecified: Secondary | ICD-10-CM | POA: Diagnosis not present

## 2019-06-24 DIAGNOSIS — E1122 Type 2 diabetes mellitus with diabetic chronic kidney disease: Secondary | ICD-10-CM | POA: Diagnosis not present

## 2019-06-24 DIAGNOSIS — M1990 Unspecified osteoarthritis, unspecified site: Secondary | ICD-10-CM | POA: Diagnosis not present

## 2019-06-24 DIAGNOSIS — D500 Iron deficiency anemia secondary to blood loss (chronic): Secondary | ICD-10-CM | POA: Diagnosis not present

## 2019-06-24 DIAGNOSIS — K648 Other hemorrhoids: Secondary | ICD-10-CM | POA: Diagnosis not present

## 2019-06-24 DIAGNOSIS — M80051D Age-related osteoporosis with current pathological fracture, right femur, subsequent encounter for fracture with routine healing: Secondary | ICD-10-CM | POA: Diagnosis not present

## 2019-06-29 ENCOUNTER — Other Ambulatory Visit: Payer: Self-pay

## 2019-06-29 ENCOUNTER — Inpatient Hospital Stay: Payer: Medicare Other

## 2019-06-29 VITALS — BP 170/61 | HR 89 | Temp 96.9°F | Resp 18

## 2019-06-29 DIAGNOSIS — D500 Iron deficiency anemia secondary to blood loss (chronic): Secondary | ICD-10-CM

## 2019-06-29 MED ORDER — SODIUM CHLORIDE 0.9 % IV SOLN
200.0000 mg | Freq: Once | INTRAVENOUS | Status: DC
  Filled 2019-06-29: qty 10

## 2019-06-29 MED ORDER — SODIUM CHLORIDE 0.9 % IV SOLN
Freq: Once | INTRAVENOUS | Status: DC
  Filled 2019-06-29: qty 250

## 2019-06-29 NOTE — Progress Notes (Signed)
Pt decided she did not want treatment today,

## 2019-07-03 ENCOUNTER — Other Ambulatory Visit: Payer: Self-pay

## 2019-07-03 ENCOUNTER — Inpatient Hospital Stay: Payer: Medicare Other | Attending: Hematology & Oncology

## 2019-07-03 VITALS — BP 160/68 | HR 80 | Temp 97.0°F | Resp 18

## 2019-07-03 DIAGNOSIS — R5383 Other fatigue: Secondary | ICD-10-CM | POA: Diagnosis not present

## 2019-07-03 DIAGNOSIS — I129 Hypertensive chronic kidney disease with stage 1 through stage 4 chronic kidney disease, or unspecified chronic kidney disease: Secondary | ICD-10-CM | POA: Insufficient documentation

## 2019-07-03 DIAGNOSIS — Z79899 Other long term (current) drug therapy: Secondary | ICD-10-CM | POA: Diagnosis not present

## 2019-07-03 DIAGNOSIS — D500 Iron deficiency anemia secondary to blood loss (chronic): Secondary | ICD-10-CM

## 2019-07-03 DIAGNOSIS — D631 Anemia in chronic kidney disease: Secondary | ICD-10-CM | POA: Insufficient documentation

## 2019-07-03 DIAGNOSIS — N189 Chronic kidney disease, unspecified: Secondary | ICD-10-CM | POA: Insufficient documentation

## 2019-07-03 MED ORDER — SODIUM CHLORIDE 0.9 % IV SOLN
200.0000 mg | Freq: Once | INTRAVENOUS | Status: AC
  Administered 2019-07-03: 200 mg via INTRAVENOUS
  Filled 2019-07-03: qty 200

## 2019-07-03 MED ORDER — SODIUM CHLORIDE 0.9 % IV SOLN
Freq: Once | INTRAVENOUS | Status: AC
  Filled 2019-07-03: qty 250

## 2019-07-03 NOTE — Patient Instructions (Signed)

## 2019-07-07 ENCOUNTER — Other Ambulatory Visit: Payer: Self-pay

## 2019-07-07 ENCOUNTER — Inpatient Hospital Stay: Payer: Medicare Other

## 2019-07-07 VITALS — BP 133/72 | HR 80 | Temp 96.9°F | Resp 18

## 2019-07-07 DIAGNOSIS — R5383 Other fatigue: Secondary | ICD-10-CM | POA: Diagnosis not present

## 2019-07-07 DIAGNOSIS — I129 Hypertensive chronic kidney disease with stage 1 through stage 4 chronic kidney disease, or unspecified chronic kidney disease: Secondary | ICD-10-CM | POA: Diagnosis not present

## 2019-07-07 DIAGNOSIS — N189 Chronic kidney disease, unspecified: Secondary | ICD-10-CM | POA: Diagnosis not present

## 2019-07-07 DIAGNOSIS — D500 Iron deficiency anemia secondary to blood loss (chronic): Secondary | ICD-10-CM | POA: Diagnosis not present

## 2019-07-07 DIAGNOSIS — D631 Anemia in chronic kidney disease: Secondary | ICD-10-CM | POA: Diagnosis not present

## 2019-07-07 DIAGNOSIS — Z79899 Other long term (current) drug therapy: Secondary | ICD-10-CM | POA: Diagnosis not present

## 2019-07-07 MED ORDER — SODIUM CHLORIDE 0.9 % IV SOLN
200.0000 mg | Freq: Once | INTRAVENOUS | Status: AC
  Administered 2019-07-07: 200 mg via INTRAVENOUS
  Filled 2019-07-07: qty 200

## 2019-07-07 MED ORDER — SODIUM CHLORIDE 0.9 % IV SOLN
Freq: Once | INTRAVENOUS | Status: AC
  Filled 2019-07-07: qty 250

## 2019-07-07 NOTE — Patient Instructions (Signed)

## 2019-07-08 ENCOUNTER — Inpatient Hospital Stay: Payer: Medicare Other

## 2019-07-08 ENCOUNTER — Inpatient Hospital Stay: Payer: Medicare Other | Admitting: Family

## 2019-07-14 ENCOUNTER — Other Ambulatory Visit: Payer: Self-pay

## 2019-07-14 ENCOUNTER — Inpatient Hospital Stay (HOSPITAL_BASED_OUTPATIENT_CLINIC_OR_DEPARTMENT_OTHER): Payer: Medicare Other | Admitting: Family

## 2019-07-14 ENCOUNTER — Inpatient Hospital Stay: Payer: Medicare Other

## 2019-07-14 ENCOUNTER — Encounter: Payer: Self-pay | Admitting: Family

## 2019-07-14 VITALS — BP 155/50 | HR 83 | Temp 97.1°F | Resp 20 | Ht 63.0 in | Wt 143.8 lb

## 2019-07-14 DIAGNOSIS — D500 Iron deficiency anemia secondary to blood loss (chronic): Secondary | ICD-10-CM

## 2019-07-14 DIAGNOSIS — I129 Hypertensive chronic kidney disease with stage 1 through stage 4 chronic kidney disease, or unspecified chronic kidney disease: Secondary | ICD-10-CM | POA: Diagnosis not present

## 2019-07-14 DIAGNOSIS — N189 Chronic kidney disease, unspecified: Secondary | ICD-10-CM | POA: Diagnosis not present

## 2019-07-14 DIAGNOSIS — R5383 Other fatigue: Secondary | ICD-10-CM | POA: Diagnosis not present

## 2019-07-14 DIAGNOSIS — D631 Anemia in chronic kidney disease: Secondary | ICD-10-CM | POA: Diagnosis not present

## 2019-07-14 DIAGNOSIS — Z79899 Other long term (current) drug therapy: Secondary | ICD-10-CM | POA: Diagnosis not present

## 2019-07-14 LAB — CBC WITH DIFFERENTIAL (CANCER CENTER ONLY)
Abs Immature Granulocytes: 0.06 10*3/uL (ref 0.00–0.07)
Basophils Absolute: 0.1 10*3/uL (ref 0.0–0.1)
Basophils Relative: 1 %
Eosinophils Absolute: 0.2 10*3/uL (ref 0.0–0.5)
Eosinophils Relative: 2 %
HCT: 34.1 % — ABNORMAL LOW (ref 36.0–46.0)
Hemoglobin: 10.9 g/dL — ABNORMAL LOW (ref 12.0–15.0)
Immature Granulocytes: 1 %
Lymphocytes Relative: 27 %
Lymphs Abs: 3.2 10*3/uL (ref 0.7–4.0)
MCH: 27.7 pg (ref 26.0–34.0)
MCHC: 32 g/dL (ref 30.0–36.0)
MCV: 86.5 fL (ref 80.0–100.0)
Monocytes Absolute: 1.4 10*3/uL — ABNORMAL HIGH (ref 0.1–1.0)
Monocytes Relative: 12 %
Neutro Abs: 6.9 10*3/uL (ref 1.7–7.7)
Neutrophils Relative %: 57 %
Platelet Count: 220 10*3/uL (ref 150–400)
RBC: 3.94 MIL/uL (ref 3.87–5.11)
RDW: 15.5 % (ref 11.5–15.5)
WBC Count: 11.8 10*3/uL — ABNORMAL HIGH (ref 4.0–10.5)
nRBC: 0 % (ref 0.0–0.2)

## 2019-07-14 LAB — CMP (CANCER CENTER ONLY)
ALT: 15 U/L (ref 0–44)
AST: 21 U/L (ref 15–41)
Albumin: 4.2 g/dL (ref 3.5–5.0)
Alkaline Phosphatase: 104 U/L (ref 38–126)
Anion gap: 9 (ref 5–15)
BUN: 54 mg/dL — ABNORMAL HIGH (ref 8–23)
CO2: 27 mmol/L (ref 22–32)
Calcium: 10 mg/dL (ref 8.9–10.3)
Chloride: 100 mmol/L (ref 98–111)
Creatinine: 1.53 mg/dL — ABNORMAL HIGH (ref 0.44–1.00)
GFR, Est AFR Am: 36 mL/min — ABNORMAL LOW (ref >60)
GFR, Estimated: 31 mL/min — ABNORMAL LOW (ref >60)
Glucose, Bld: 71 mg/dL (ref 70–99)
Potassium: 4.5 mmol/L (ref 3.5–5.1)
Sodium: 136 mmol/L (ref 135–145)
Total Bilirubin: 0.4 mg/dL (ref 0.3–1.2)
Total Protein: 7.4 g/dL (ref 6.5–8.1)

## 2019-07-14 MED ORDER — EPOETIN ALFA-EPBX 40000 UNIT/ML IJ SOLN
INTRAMUSCULAR | Status: AC
  Filled 2019-07-14: qty 1

## 2019-07-14 MED ORDER — EPOETIN ALFA-EPBX 40000 UNIT/ML IJ SOLN
40000.0000 [IU] | Freq: Once | INTRAMUSCULAR | Status: AC
  Administered 2019-07-14: 40000 [IU] via SUBCUTANEOUS

## 2019-07-14 NOTE — Progress Notes (Signed)
Hematology and Oncology Follow Up Visit  Ann Fletcher [id number] 1932-08-22 86 y.o. 07/14/2019   Principle Diagnosis:  Iron deficiency anemia -- iron malabsorption and chronic blood loss Erythropoitin deficiencyanemia  Current Therapy:        IV iron as indicated Retacrit 40,000 unit SQ as indicatedfor Hgb < 11   Interim History:  [name] is here today for follow-up and Retacrit. She has some mild fatigue at this time. Hgb is 10.9.  She did received two doses of IV iron last month.  No fever, chills, n/v, cough, rash, dizziness, SOB, chest pain, palpitations, abdominal pain or changes in bowel or bladder habits.  She has not noted and blood loss. No bruising or petechiae.  No numbness or tingling in her extremities at this time.  No falls or syncopal episodes.  She ambulates with a rolling walker for added support.  She has maintained a good appetite and is staying well hydrated. Her weight is stable.    ECOG Performance Status: 2 - Symptomatic, <50% confined to bed  Medications:  Allergies as of 07/14/2019      Reactions   Tizanidine Hcl Other (See Comments)   Dizziness, patient reports fall   Nsaids Itching, Other (See Comments)   Stomach cramps   Pradaxa [dabigatran Etexilate Mesylate] Nausea And Vomiting   Sulfa Antibiotics Nausea And Vomiting   Betadine [povidone Iodine] Itching   Povidone-iodine Rash      Medication List       Accurate as of July 14, 2019  2:47 PM. If you have any questions, ask your nurse or doctor.        albuterol 108 (90 Base) MCG/ACT inhaler Commonly known as: VENTOLIN HFA Inhale 2 puffs into the lungs 3 (three) times daily as needed for wheezing or shortness of breath.   aspirin 81 MG chewable tablet Chew 81 mg by mouth daily.   atorvastatin 40 MG tablet Commonly known as: LIPITOR Take 40 mg by mouth at bedtime.   fluticasone 50 MCG/ACT nasal spray Commonly known as: FLONASE Place 1 spray into both nostrils daily as needed  for allergies.   furosemide 80 MG tablet Commonly known as: LASIX TAKE ONE WHOLE TABLET (80MG ) IN THE MORNING AND HALF TABLET (40 MG) IN THE EVENING.   glimepiride 2 MG tablet Commonly known as: AMARYL Take 2 mg by mouth every morning.   levothyroxine 100 MCG tablet Commonly known as: SYNTHROID Take 100 mcg by mouth daily before breakfast.   LORazepam 1 MG tablet Commonly known as: ATIVAN Take 1 mg by mouth 3 (three) times daily as needed for anxiety.   OSCAL 500/200 D-3 PO Take 1 tablet by mouth daily.   potassium chloride SA 20 MEQ tablet Commonly known as: KLOR-CON Take 1 tablet (20 mEq total) by mouth daily.   vitamin E 180 MG (400 UNITS) capsule Generic drug: vitamin E Take 400 Units by mouth daily.       Allergies:  Allergies  Allergen Reactions  . Tizanidine Hcl Other (See Comments)    Dizziness, patient reports fall  . Nsaids Itching and Other (See Comments)    Stomach cramps   . Pradaxa [Dabigatran Etexilate Mesylate] Nausea And Vomiting  . Sulfa Antibiotics Nausea And Vomiting  . Betadine [Povidone Iodine] Itching  . Povidone-Iodine Rash    Past Medical History, Surgical history, Social history, and Family History were reviewed and updated.  Review of Systems: All other 10 point review of systems is negative.   Physical Exam:  vitals were not taken for this visit.   Wt Readings from Last 3 Encounters:  06/17/19 142 lb 12.8 oz (64.8 kg)  05/27/19 142 lb 6.4 oz (64.6 kg)  04/20/19 142 lb (64.4 kg)    Ocular: Sclerae unicteric, pupils equal, round and reactive to light Ear-nose-throat: Oropharynx clear, dentition fair Lymphatic: No cervical or supraclavicular adenopathy Lungs no rales or rhonchi, good excursion bilaterally Heart regular rate and rhythm, no murmur appreciated Abd soft, nontender, positive bowel sounds, no liver or spleen tip palpated on exam, no fluid wave  MSK no focal spinal tenderness, no joint edema Neuro: non-focal,  well-oriented, appropriate affect Breasts: Deferred   Lab Results  Component Value Date   WBC 11.8 (H) 07/14/2019   HGB 10.9 (L) 07/14/2019   HCT 34.1 (L) 07/14/2019   MCV 86.5 07/14/2019   PLT 220 07/14/2019   Lab Results  Component Value Date   FERRITIN 1,258 (H) 06/17/2019   IRON 50 06/17/2019   TIBC 278 06/17/2019   UIBC 228 06/17/2019   IRONPCTSAT 18 (L) 06/17/2019   Lab Results  Component Value Date   RETICCTPCT 0.8 05/27/2019   RBC 3.94 07/14/2019   No results found for: KPAFRELGTCHN, LAMBDASER, KAPLAMBRATIO No results found for: IGGSERUM, IGA, IGMSERUM No results found for: Ronnald Ramp, A1GS, A2GS, Violet Baldy, MSPIKE, SPEI   Chemistry      Component Value Date/Time   NA 140 06/17/2019 1444   NA 142 03/20/2019 1032   K 4.5 06/17/2019 1444   CL 102 06/17/2019 1444   CO2 28 06/17/2019 1444   BUN 49 (H) 06/17/2019 1444   BUN 43 (H) 03/20/2019 1032   CREATININE 1.35 (H) 06/17/2019 1444   CREATININE 1.15 (H) 10/29/2014 1608      Component Value Date/Time   CALCIUM 10.4 (H) 06/17/2019 1444   ALKPHOS 104 06/17/2019 1444   AST 23 06/17/2019 1444   ALT 15 06/17/2019 1444   BILITOT 0.4 06/17/2019 1444       Impression and Plan: [name] is a very pleasant 86 yo caucasian female with both iron deficiency anderythropoietin deficiency anemias. Retacrit given for Hgb 10.9.  Iron studies are pending. We will replace if needed.  We will plan to see her again in 3 weeks.  She will contact our office with any questions or concerns. We can certainly see her sooner if needed.   Ann Peace, NP 4/13/20212:47 PM

## 2019-07-14 NOTE — Patient Instructions (Signed)

## 2019-07-15 ENCOUNTER — Telehealth: Payer: Self-pay | Admitting: Family

## 2019-07-15 LAB — IRON AND TIBC
Iron: 50 ug/dL (ref 41–142)
Saturation Ratios: 19 % — ABNORMAL LOW (ref 21–57)
TIBC: 264 ug/dL (ref 236–444)
UIBC: 214 ug/dL (ref 120–384)

## 2019-07-15 LAB — FERRITIN: Ferritin: 1493 ng/mL — ABNORMAL HIGH (ref 11–307)

## 2019-07-15 NOTE — Telephone Encounter (Signed)
Called and spoke w/ Ann Fletcher regarding appointments added for Venofer.  She asked that we schedule out until end of next week due to family issues.  Appts scheduled as requested per 4/14 sch msg

## 2019-07-17 NOTE — Progress Notes (Signed)
Pharmacist Chemotherapy Monitoring - Follow Up Assessment    I verify that I have reviewed each item in the below checklist:  . Regimen for the patient is scheduled for the appropriate day and plan matches scheduled date. [name] Appropriate non-routine labs are ordered dependent on drug ordered. . If applicable, additional medications reviewed and ordered per protocol based on lifetime cumulative doses and/or treatment regimen.   Plan for follow-up and/or issues identified: No . I-vent associated with next due treatment: No . MD and/or nursing notified: No  Ann Fletcher, Ann Fletcher 07/17/2019 8:38 AM

## 2019-07-24 ENCOUNTER — Inpatient Hospital Stay: Payer: Medicare Other

## 2019-07-24 ENCOUNTER — Other Ambulatory Visit: Payer: Self-pay

## 2019-07-24 VITALS — BP 170/64 | HR 83 | Temp 97.1°F | Resp 19

## 2019-07-24 DIAGNOSIS — Z79899 Other long term (current) drug therapy: Secondary | ICD-10-CM | POA: Diagnosis not present

## 2019-07-24 DIAGNOSIS — I129 Hypertensive chronic kidney disease with stage 1 through stage 4 chronic kidney disease, or unspecified chronic kidney disease: Secondary | ICD-10-CM | POA: Diagnosis not present

## 2019-07-24 DIAGNOSIS — D500 Iron deficiency anemia secondary to blood loss (chronic): Secondary | ICD-10-CM | POA: Diagnosis not present

## 2019-07-24 DIAGNOSIS — R5383 Other fatigue: Secondary | ICD-10-CM | POA: Diagnosis not present

## 2019-07-24 DIAGNOSIS — N189 Chronic kidney disease, unspecified: Secondary | ICD-10-CM | POA: Diagnosis not present

## 2019-07-24 DIAGNOSIS — D631 Anemia in chronic kidney disease: Secondary | ICD-10-CM | POA: Diagnosis not present

## 2019-07-24 MED ORDER — SODIUM CHLORIDE 0.9 % IV SOLN
INTRAVENOUS | Status: DC
  Filled 2019-07-24: qty 250

## 2019-07-24 MED ORDER — SODIUM CHLORIDE 0.9 % IV SOLN
200.0000 mg | Freq: Once | INTRAVENOUS | Status: AC
  Administered 2019-07-24: 200 mg via INTRAVENOUS
  Filled 2019-07-24: qty 10

## 2019-07-28 ENCOUNTER — Inpatient Hospital Stay: Payer: Medicare Other

## 2019-07-28 DIAGNOSIS — S92902A Unspecified fracture of left foot, initial encounter for closed fracture: Secondary | ICD-10-CM | POA: Diagnosis not present

## 2019-07-28 DIAGNOSIS — M79672 Pain in left foot: Secondary | ICD-10-CM | POA: Diagnosis not present

## 2019-07-28 DIAGNOSIS — S92352A Displaced fracture of fifth metatarsal bone, left foot, initial encounter for closed fracture: Secondary | ICD-10-CM | POA: Diagnosis not present

## 2019-07-29 ENCOUNTER — Inpatient Hospital Stay: Payer: Medicare Other

## 2019-07-29 ENCOUNTER — Ambulatory Visit (INDEPENDENT_AMBULATORY_CARE_PROVIDER_SITE_OTHER): Payer: Medicare Other | Admitting: Podiatry

## 2019-07-29 ENCOUNTER — Other Ambulatory Visit: Payer: Self-pay

## 2019-07-29 ENCOUNTER — Ambulatory Visit: Payer: Medicare Other

## 2019-07-29 ENCOUNTER — Other Ambulatory Visit: Payer: Self-pay | Admitting: Podiatry

## 2019-07-29 DIAGNOSIS — S92355A Nondisplaced fracture of fifth metatarsal bone, left foot, initial encounter for closed fracture: Secondary | ICD-10-CM

## 2019-07-29 DIAGNOSIS — E119 Type 2 diabetes mellitus without complications: Secondary | ICD-10-CM | POA: Diagnosis not present

## 2019-07-29 NOTE — Progress Notes (Signed)
   HPI: 86 y.o. female presenting today with a chief complaint of a possible fracture of the left lateral foot that occurred due to an injury at her house yesterday. She states she fell causing the injury. Applying pressure to the foot increases the pain. She has been nonweightbearing which helps ease her pain. She had X-Rays taken and presents with the disc. Patient is here for further evaluation and treatment.   Past Medical History:  Diagnosis Date  . Anxiety   . Arthritis   . AV node arrhythmia    a. s/p AV node ablation/PPM 07/2017.  [name] Chronic diastolic CHF (congestive heart failure) ([place])    a. Acute exacerbation occurred in setting of AF.  [name] CKD (chronic kidney disease), stage III   . Diabetes mellitus without complication ([place])    type 2  . 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 H/O bladder infections   . Hyperlipidemia   . Hypertension   . Hypothyroidism   . Lower extremity edema   . Osteoporosis   . Persistent atrial fibrillation ([place])    a. diagnosed in 10/2014 by PCP, underwent failed TEE DCCV on 10/08/14, loaded with amio, successfully DCCV on 10/15/14.     Physical Exam: General: The patient is alert and oriented x3 in no acute distress.  Dermatology: Skin is warm, dry and supple bilateral lower extremities. Negative for open lesions or macerations.  Vascular: Palpable pedal pulses bilaterally. No edema or erythema noted. Capillary refill within normal limits.  Neurological: Epicritic and protective threshold grossly intact bilaterally.   Musculoskeletal Exam: Pain with palpation noted to the 5th metatarsal of the left foot. Range of motion within normal limits to all pedal and ankle joints bilateral. Muscle strength 5/5 in all groups bilateral.   Assessment: 1. Fracture 5th metatarsal diaphysis left, closed, nondisplaced based on external X-Rays    Plan of Care:  1. Patient evaluated. External X-Rays reviewed.  2. Ace wrap applied.  3. Post op shoe  dispensed.  4. Minimal weightbearing with walker or wheel chair.  5. Return to clinic in 4 weeks for follow up X-Ray.      Edrick Kins, DPM [facility]  Dr. Edrick Kins, DPM    [address], [place] [address]                Office [phone]  Fax [phone]

## 2019-08-04 ENCOUNTER — Inpatient Hospital Stay: Payer: Medicare Other | Attending: Hematology & Oncology

## 2019-08-04 ENCOUNTER — Other Ambulatory Visit: Payer: Self-pay

## 2019-08-04 ENCOUNTER — Inpatient Hospital Stay: Payer: Medicare Other

## 2019-08-04 ENCOUNTER — Encounter: Payer: Self-pay | Admitting: Family

## 2019-08-04 ENCOUNTER — Inpatient Hospital Stay (HOSPITAL_BASED_OUTPATIENT_CLINIC_OR_DEPARTMENT_OTHER): Payer: Medicare Other | Admitting: Family

## 2019-08-04 ENCOUNTER — Telehealth: Payer: Self-pay | Admitting: Family

## 2019-08-04 VITALS — BP 148/59 | HR 70 | Temp 97.1°F | Resp 20 | Ht 63.0 in

## 2019-08-04 VITALS — BP 153/56 | HR 70 | Resp 16

## 2019-08-04 DIAGNOSIS — N189 Chronic kidney disease, unspecified: Secondary | ICD-10-CM | POA: Diagnosis not present

## 2019-08-04 DIAGNOSIS — D500 Iron deficiency anemia secondary to blood loss (chronic): Secondary | ICD-10-CM | POA: Diagnosis not present

## 2019-08-04 DIAGNOSIS — K909 Intestinal malabsorption, unspecified: Secondary | ICD-10-CM | POA: Diagnosis not present

## 2019-08-04 DIAGNOSIS — D631 Anemia in chronic kidney disease: Secondary | ICD-10-CM | POA: Insufficient documentation

## 2019-08-04 DIAGNOSIS — I129 Hypertensive chronic kidney disease with stage 1 through stage 4 chronic kidney disease, or unspecified chronic kidney disease: Secondary | ICD-10-CM | POA: Diagnosis not present

## 2019-08-04 DIAGNOSIS — Z79899 Other long term (current) drug therapy: Secondary | ICD-10-CM | POA: Diagnosis not present

## 2019-08-04 LAB — CBC WITH DIFFERENTIAL (CANCER CENTER ONLY)
Band Neutrophils: 0 %
Basophils Absolute: 0.1 10*3/uL (ref 0.0–0.1)
Basophils Relative: 1 %
Eosinophils Absolute: 0 10*3/uL (ref 0.0–0.5)
Eosinophils Relative: 0 %
HCT: 36.2 % (ref 36.0–46.0)
Hemoglobin: 11.6 g/dL — ABNORMAL LOW (ref 12.0–15.0)
Lymphocytes Relative: 11 %
Lymphs Abs: 1.3 10*3/uL (ref 0.7–4.0)
MCH: 28 pg (ref 26.0–34.0)
MCHC: 32 g/dL (ref 30.0–36.0)
MCV: 87.4 fL (ref 80.0–100.0)
Monocytes Absolute: 0.9 10*3/uL (ref 0.1–1.0)
Monocytes Relative: 8 %
Neutro Abs: 9 10*3/uL — ABNORMAL HIGH (ref 1.7–7.7)
Neutrophils Relative %: 79 %
Platelet Count: 250 10*3/uL (ref 150–400)
RBC: 4.14 MIL/uL (ref 3.87–5.11)
RDW: 14.8 % (ref 11.5–15.5)
WBC Count: 11.4 10*3/uL — ABNORMAL HIGH (ref 4.0–10.5)
nRBC: 0 % (ref 0.0–0.2)

## 2019-08-04 LAB — CMP (CANCER CENTER ONLY)
ALT: 15 U/L (ref 0–44)
AST: 22 U/L (ref 15–41)
Albumin: 4.3 g/dL (ref 3.5–5.0)
Alkaline Phosphatase: 107 U/L (ref 38–126)
Anion gap: 11 (ref 5–15)
BUN: 53 mg/dL — ABNORMAL HIGH (ref 8–23)
CO2: 25 mmol/L (ref 22–32)
Calcium: 10 mg/dL (ref 8.9–10.3)
Chloride: 97 mmol/L — ABNORMAL LOW (ref 98–111)
Creatinine: 1.63 mg/dL — ABNORMAL HIGH (ref 0.44–1.00)
GFR, Est AFR Am: 33 mL/min — ABNORMAL LOW (ref >60)
GFR, Estimated: 29 mL/min — ABNORMAL LOW (ref >60)
Glucose, Bld: 324 mg/dL — ABNORMAL HIGH (ref 70–99)
Potassium: 4.6 mmol/L (ref 3.5–5.1)
Sodium: 133 mmol/L — ABNORMAL LOW (ref 135–145)
Total Bilirubin: 0.6 mg/dL (ref 0.3–1.2)
Total Protein: 7.8 g/dL (ref 6.5–8.1)

## 2019-08-04 MED ORDER — SODIUM CHLORIDE 0.9 % IV SOLN
200.0000 mg | Freq: Once | INTRAVENOUS | Status: AC
  Administered 2019-08-04: 200 mg via INTRAVENOUS
  Filled 2019-08-04: qty 10

## 2019-08-04 MED ORDER — SODIUM CHLORIDE 0.9 % IV SOLN
Freq: Once | INTRAVENOUS | Status: AC
  Filled 2019-08-04: qty 250

## 2019-08-04 NOTE — Telephone Encounter (Signed)
Appointments scheduled calendar printed per 5/4 los 

## 2019-08-04 NOTE — Patient Instructions (Signed)

## 2019-08-04 NOTE — Progress Notes (Signed)
Hematology and Oncology Follow Up Visit  Ann Fletcher [id number] 06/15/1932 86 y.o. 08/04/2019   Principle Diagnosis:  Iron deficiency anemia -- iron malabsorption and chronic blood loss Erythropoitin deficiencyanemia  Current Therapy: IV iron as indicated Retacrit 40,000 unit SQ as indicatedfor Hgb < 11   Interim History:  Ann Fletcher is here today for follow-up and IV iron. Unfortunately she recently feel while ambulating in her kitchen without her walker. She cracked a bone in her left foot and is currently wearing a boot. She states that she sees her orthopedist again in a month.  Hgb is stable at 11.6, MCV 87.    No episodes of bleeding. No petechiae. She does have various healing bruises noted on exam from her fall.  No fever, chills, n/v, cough, rash, dizziness, SOB, chest pain, palpitations, abdominal pain or changes in bowel or bladder habits.  No numbness or tingling in her extremities.  Weight bearing is a bit uncomfortable for her still but she denies pain when sitting.  She states that she has a good appetite and is staying hydrated. Unable to stand for weight today.   ECOG Performance Status: 1 - Symptomatic but completely ambulatory  Medications:  Allergies as of 08/04/2019      Reactions   Tizanidine Hcl Other (See Comments)   Dizziness, patient reports fall   Nsaids Itching, Other (See Comments)   Stomach cramps   Pradaxa [dabigatran Etexilate Mesylate] Nausea And Vomiting   Sulfa Antibiotics Nausea And Vomiting   Betadine [povidone Iodine] Itching   Povidone-iodine Rash      Medication List       Accurate as of Aug 04, 2019  1:33 PM. If you have any questions, ask your nurse or doctor.        albuterol 108 (90 Base) MCG/ACT inhaler Commonly known as: VENTOLIN HFA Inhale 2 puffs into the lungs 3 (three) times daily as needed for wheezing or shortness of breath.   aspirin 81 MG chewable tablet Chew 81 mg by mouth daily.   atorvastatin 40 MG  tablet Commonly known as: LIPITOR Take 40 mg by mouth at bedtime.   cephALEXin 500 MG capsule Commonly known as: KEFLEX Take 500 mg by mouth 2 (two) times daily.   fluticasone 50 MCG/ACT nasal spray Commonly known as: FLONASE Place 1 spray into both nostrils daily as needed for allergies.   furosemide 80 MG tablet Commonly known as: LASIX TAKE ONE WHOLE TABLET (80MG ) IN THE MORNING AND HALF TABLET (40 MG) IN THE EVENING.   glimepiride 2 MG tablet Commonly known as: AMARYL Take 2 mg by mouth every morning.   levothyroxine 100 MCG tablet Commonly known as: SYNTHROID Take 100 mcg by mouth daily before breakfast.   LORazepam 1 MG tablet Commonly known as: ATIVAN Take 1 mg by mouth 3 (three) times daily as needed for anxiety.   OSCAL 500/200 D-3 PO Take 1 tablet by mouth daily.   potassium chloride SA 20 MEQ tablet Commonly known as: KLOR-CON Take 1 tablet (20 mEq total) by mouth daily.   vitamin E 180 MG (400 UNITS) capsule Generic drug: vitamin E Take 400 Units by mouth daily.       Allergies:  Allergies  Allergen Reactions  . Tizanidine Hcl Other (See Comments)    Dizziness, patient reports fall  . Nsaids Itching and Other (See Comments)    Stomach cramps   . Pradaxa [Dabigatran Etexilate Mesylate] Nausea And Vomiting  . Sulfa Antibiotics Nausea And Vomiting  .  Betadine [Povidone Iodine] Itching  . Povidone-Iodine Rash    Past Medical History, Surgical history, Social history, and Family History were reviewed and updated.  Review of Systems: All other 10 point review of systems is negative.   Physical Exam:  vitals were not taken for this visit.   Wt Readings from Last 3 Encounters:  07/14/19 143 lb 12.8 oz (65.2 kg)  06/17/19 142 lb 12.8 oz (64.8 kg)  05/27/19 142 lb 6.4 oz (64.6 kg)    Ocular: Sclerae unicteric, pupils equal, round and reactive to light Ear-nose-throat: Oropharynx clear, dentition fair Lymphatic: No cervical or supraclavicular  adenopathy Lungs no rales or rhonchi, good excursion bilaterally Heart regular rate and rhythm, no murmur appreciated Abd soft, nontender, positive bowel sounds, no liver or spleen tip palpated on exam, no fluid wave  MSK no focal spinal tenderness, no joint edema Neuro: non-focal, well-oriented, appropriate affect Breasts: Deferred   Lab Results  Component Value Date   WBC 11.4 (H) 08/04/2019   HGB 11.6 (L) 08/04/2019   HCT 36.2 08/04/2019   MCV 87.4 08/04/2019   PLT 250 08/04/2019   Lab Results  Component Value Date   FERRITIN 1,493 (H) 07/14/2019   IRON 50 07/14/2019   TIBC 264 07/14/2019   UIBC 214 07/14/2019   IRONPCTSAT 19 (L) 07/14/2019   Lab Results  Component Value Date   RETICCTPCT 0.8 05/27/2019   RBC 4.14 08/04/2019   No results found for: KPAFRELGTCHN, LAMBDASER, KAPLAMBRATIO No results found for: IGGSERUM, IGA, IGMSERUM No results found for: Ronnald Ramp, A1GS, A2GS, Tillman Sers, SPEI   Chemistry      Component Value Date/Time   NA 136 07/14/2019 1432   NA 142 03/20/2019 1032   K 4.5 07/14/2019 1432   CL 100 07/14/2019 1432   CO2 27 07/14/2019 1432   BUN 54 (H) 07/14/2019 1432   BUN 43 (H) 03/20/2019 1032   CREATININE 1.53 (H) 07/14/2019 1432   CREATININE 1.15 (H) 10/29/2014 1608      Component Value Date/Time   CALCIUM 10.0 07/14/2019 1432   ALKPHOS 104 07/14/2019 1432   AST 21 07/14/2019 1432   ALT 15 07/14/2019 1432   BILITOT 0.4 07/14/2019 1432       Impression and Plan: Ann Fletcher is a very pleasant 86 yo caucasian female with both iron deficiency anderythropoietin deficiency anemias. No Retacrit given, Hgb 11.6.  She was due for Iv iron today and we will proceed with her infusion as planned and cancel next weeks infusion (since Retacrit was not needed) We will plan to see her again in 4 weeks.  She will contact our office with any questions or concerns. We can certainly see her sooner if needed.  Ann Peace, NP 5/4/20211:33 PM

## 2019-08-05 LAB — IRON AND TIBC
Iron: 41 ug/dL (ref 41–142)
Saturation Ratios: 17 % — ABNORMAL LOW (ref 21–57)
TIBC: 243 ug/dL (ref 236–444)
UIBC: 201 ug/dL (ref 120–384)

## 2019-08-05 LAB — FERRITIN: Ferritin: 1917 ng/mL — ABNORMAL HIGH (ref 11–307)

## 2019-08-11 ENCOUNTER — Inpatient Hospital Stay: Payer: Medicare Other

## 2019-08-26 ENCOUNTER — Other Ambulatory Visit: Payer: Self-pay

## 2019-08-26 ENCOUNTER — Ambulatory Visit (INDEPENDENT_AMBULATORY_CARE_PROVIDER_SITE_OTHER): Payer: Medicare Other

## 2019-08-26 ENCOUNTER — Ambulatory Visit (INDEPENDENT_AMBULATORY_CARE_PROVIDER_SITE_OTHER): Payer: Medicare Other | Admitting: Podiatry

## 2019-08-26 VITALS — Temp 96.5°F

## 2019-08-26 DIAGNOSIS — S92355A Nondisplaced fracture of fifth metatarsal bone, left foot, initial encounter for closed fracture: Secondary | ICD-10-CM | POA: Diagnosis not present

## 2019-08-28 NOTE — Progress Notes (Signed)
   HPI: 86 y.o. female presenting today for follow up evaluation of a fractured left 5th metatarsal. She states she is doing well. She denies any significant pain or modifying factors. She has been using the ace wraps as directed. Patient is here for further evaluation and treatment.   Past Medical History:  Diagnosis Date  . Anxiety   . Arthritis   . AV node arrhythmia    a. s/p AV node ablation/PPM 07/2017.  [name] Chronic diastolic CHF (congestive heart failure) ([place])    a. Acute exacerbation occurred in setting of AF.  [name] CKD (chronic kidney disease), stage III   . Diabetes mellitus without complication ([place])    type 2  . 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 H/O bladder infections   . Hyperlipidemia   . Hypertension   . Hypothyroidism   . Lower extremity edema   . Osteoporosis   . Persistent atrial fibrillation ([facility])    a. diagnosed in 10/2014 by PCP, underwent failed TEE DCCV on 10/08/14, loaded with amio, successfully DCCV on 10/15/14.     Physical Exam: General: The patient is alert and oriented x3 in no acute distress.  Dermatology: Skin is warm, dry and supple bilateral lower extremities. Negative for open lesions or macerations.  Vascular: Palpable pedal pulses bilaterally. No edema or erythema noted. Capillary refill within normal limits.  Neurological: Epicritic and protective threshold grossly intact bilaterally.   Musculoskeletal Exam: Pain with palpation noted to the 5th metatarsal of the left foot. Range of motion within normal limits to all pedal and ankle joints bilateral. Muscle strength 5/5 in all groups bilateral.   Radiographic Exam: Closed, nondisplaced fracture of the 5th metatarsal diaphysis of the left foot.   Assessment: 1. Fracture 5th metatarsal diaphysis left, closed, nondisplaced    Plan of Care:  1. Patient evaluated. X-Rays reviewed.  2. Continue using Ace wraps.  3. Recommended good shoe gear. Discontinue using post op shoe because it is  eliciting back pain.  4. Weightbearing as tolerated.  5. Return to clinic in 6 weeks for follow up X-Rays.       Edrick Kins, DPM [facility]  Dr. Edrick Kins, DPM    [address], [place] [address]                Office [phone]  Fax [phone]

## 2019-09-01 ENCOUNTER — Inpatient Hospital Stay (HOSPITAL_BASED_OUTPATIENT_CLINIC_OR_DEPARTMENT_OTHER): Payer: Medicare Other | Admitting: Family

## 2019-09-01 ENCOUNTER — Other Ambulatory Visit: Payer: Self-pay

## 2019-09-01 ENCOUNTER — Inpatient Hospital Stay: Payer: Medicare Other

## 2019-09-01 ENCOUNTER — Inpatient Hospital Stay: Payer: Medicare Other | Attending: Hematology & Oncology

## 2019-09-01 ENCOUNTER — Ambulatory Visit (INDEPENDENT_AMBULATORY_CARE_PROVIDER_SITE_OTHER): Payer: Medicare Other | Admitting: *Deleted

## 2019-09-01 VITALS — BP 149/65 | HR 83 | Temp 96.8°F | Resp 18 | Wt 140.2 lb

## 2019-09-01 DIAGNOSIS — D500 Iron deficiency anemia secondary to blood loss (chronic): Secondary | ICD-10-CM

## 2019-09-01 DIAGNOSIS — D631 Anemia in chronic kidney disease: Secondary | ICD-10-CM | POA: Diagnosis not present

## 2019-09-01 DIAGNOSIS — I4819 Other persistent atrial fibrillation: Secondary | ICD-10-CM | POA: Diagnosis not present

## 2019-09-01 LAB — CMP (CANCER CENTER ONLY)
ALT: 17 U/L (ref 0–44)
AST: 27 U/L (ref 15–41)
Albumin: 4.3 g/dL (ref 3.5–5.0)
Alkaline Phosphatase: 128 U/L — ABNORMAL HIGH (ref 38–126)
Anion gap: 11 (ref 5–15)
BUN: 43 mg/dL — ABNORMAL HIGH (ref 8–23)
CO2: 26 mmol/L (ref 22–32)
Calcium: 10.2 mg/dL (ref 8.9–10.3)
Chloride: 101 mmol/L (ref 98–111)
Creatinine: 1.56 mg/dL — ABNORMAL HIGH (ref 0.44–1.00)
GFR, Est AFR Am: 35 mL/min — ABNORMAL LOW (ref >60)
GFR, Estimated: 30 mL/min — ABNORMAL LOW (ref >60)
Glucose, Bld: 242 mg/dL — ABNORMAL HIGH (ref 70–99)
Potassium: 4.1 mmol/L (ref 3.5–5.1)
Sodium: 138 mmol/L (ref 135–145)
Total Bilirubin: 0.5 mg/dL (ref 0.3–1.2)
Total Protein: 7.7 g/dL (ref 6.5–8.1)

## 2019-09-01 LAB — RETICULOCYTES
Immature Retic Fract: 10.6 % (ref 2.3–15.9)
RBC.: 4.14 MIL/uL (ref 3.87–5.11)
Retic Count, Absolute: 50.9 10*3/uL (ref 19.0–186.0)
Retic Ct Pct: 1.2 % (ref 0.4–3.1)

## 2019-09-01 LAB — CBC WITH DIFFERENTIAL (CANCER CENTER ONLY)
Abs Immature Granulocytes: 0.14 10*3/uL — ABNORMAL HIGH (ref 0.00–0.07)
Basophils Absolute: 0 10*3/uL (ref 0.0–0.1)
Basophils Relative: 0 %
Eosinophils Absolute: 0 10*3/uL (ref 0.0–0.5)
Eosinophils Relative: 0 %
HCT: 36.8 % (ref 36.0–46.0)
Hemoglobin: 11.8 g/dL — ABNORMAL LOW (ref 12.0–15.0)
Immature Granulocytes: 1 %
Lymphocytes Relative: 18 %
Lymphs Abs: 1.8 10*3/uL (ref 0.7–4.0)
MCH: 28.4 pg (ref 26.0–34.0)
MCHC: 32.1 g/dL (ref 30.0–36.0)
MCV: 88.5 fL (ref 80.0–100.0)
Monocytes Absolute: 1.1 10*3/uL — ABNORMAL HIGH (ref 0.1–1.0)
Monocytes Relative: 11 %
Neutro Abs: 7.2 10*3/uL (ref 1.7–7.7)
Neutrophils Relative %: 70 %
Platelet Count: 185 10*3/uL (ref 150–400)
RBC: 4.16 MIL/uL (ref 3.87–5.11)
RDW: 14.5 % (ref 11.5–15.5)
WBC Count: 10.4 10*3/uL (ref 4.0–10.5)
nRBC: 0 % (ref 0.0–0.2)

## 2019-09-01 NOTE — Progress Notes (Signed)
Hematology and Oncology Follow Up Visit  Ann Fletcher [id number] 1932-05-29 86 y.o. 09/01/2019   Principle Diagnosis:  Iron deficiency anemia -- iron malabsorption and chronic blood loss Erythropoitin deficiencyanemia  Current Therapy: IV iron as indicated Retacrit 40,000 unit SQ as indicatedfor Hgb < 11   Interim History:  [name] is here today for follow-up. She is doing fairly well but still has issues with balance and falls. She fell again this morning and EMS came to help her get up. She states that she was not injured. No syncope.  She has had no episodes of bleeding. No bruising or petechiae.  No fever, chills, n/v, cough, rash, dizziness, SOB, chest pain, palpitations, abdominal pain or changes in bowel or bladder habits.  No swelling, tenderness, numbness or tingling in her extremities.  She states that she has maintained a good appetite and is staying well hydrated. Her weight is stable.   ECOG Performance Status: 1 - Symptomatic but completely ambulatory  Medications:  Allergies as of 09/01/2019      Reactions   Tizanidine Hcl Other (See Comments)   Dizziness, patient reports fall   Nsaids Itching, Other (See Comments)   Stomach cramps   Pradaxa [dabigatran Etexilate Mesylate] Nausea And Vomiting   Sulfa Antibiotics Nausea And Vomiting   Betadine [povidone Iodine] Itching   Povidone-iodine Rash      Medication List       Accurate as of September 01, 2019  1:28 PM. If you have any questions, ask your nurse or doctor.        albuterol 108 (90 Base) MCG/ACT inhaler Commonly known as: VENTOLIN HFA Inhale 2 puffs into the lungs 3 (three) times daily as needed for wheezing or shortness of breath.   aspirin 81 MG chewable tablet Chew 81 mg by mouth daily.   atorvastatin 40 MG tablet Commonly known as: LIPITOR Take 40 mg by mouth at bedtime.   cephALEXin 500 MG capsule Commonly known as: KEFLEX Take 500 mg by mouth 2 (two) times daily.   fluticasone  50 MCG/ACT nasal spray Commonly known as: FLONASE Place 1 spray into both nostrils daily as needed for allergies.   furosemide 80 MG tablet Commonly known as: LASIX TAKE ONE WHOLE TABLET (80MG ) IN THE MORNING AND HALF TABLET (40 MG) IN THE EVENING.   furosemide 40 MG tablet Commonly known as: LASIX Take 40 mg by mouth at bedtime.   glimepiride 2 MG tablet Commonly known as: AMARYL Take 2 mg by mouth every morning.   levothyroxine 100 MCG tablet Commonly known as: SYNTHROID Take 100 mcg by mouth daily before breakfast.   LORazepam 1 MG tablet Commonly known as: ATIVAN Take 1 mg by mouth 3 (three) times daily as needed for anxiety.   OSCAL 500/200 D-3 PO Take 1 tablet by mouth daily.   potassium chloride SA 20 MEQ tablet Commonly known as: KLOR-CON Take 1 tablet (20 mEq total) by mouth daily.   vitamin E 180 MG (400 UNITS) capsule Generic drug: vitamin E Take 400 Units by mouth daily.       Allergies:  Allergies  Allergen Reactions  . Tizanidine Hcl Other (See Comments)    Dizziness, patient reports fall  . Nsaids Itching and Other (See Comments)    Stomach cramps   . Pradaxa [Dabigatran Etexilate Mesylate] Nausea And Vomiting  . Sulfa Antibiotics Nausea And Vomiting  . Betadine [Povidone Iodine] Itching  . Povidone-Iodine Rash    Past Medical History, Surgical history, Social history,  and Family History were reviewed and updated.  Review of Systems: All other 10 point review of systems is negative.   Physical Exam:  vitals were not taken for this visit.   Wt Readings from Last 3 Encounters:  07/14/19 143 lb 12.8 oz (65.2 kg)  06/17/19 142 lb 12.8 oz (64.8 kg)  05/27/19 142 lb 6.4 oz (64.6 kg)    Ocular: Sclerae unicteric, pupils equal, round and reactive to light Ear-nose-throat: Oropharynx clear, dentition fair Lymphatic: No cervical or supraclavicular adenopathy Lungs no rales or rhonchi, good excursion bilaterally Heart regular rate and rhythm,  no murmur appreciated Abd soft, nontender, positive bowel sounds, no liver or spleen tip palpated on exam, no fluid wave  MSK no focal spinal tenderness, no joint edema Neuro: non-focal, well-oriented, appropriate affect Breasts: Deferred   Lab Results  Component Value Date   WBC 11.4 (H) 08/04/2019   HGB 11.6 (L) 08/04/2019   HCT 36.2 08/04/2019   MCV 87.4 08/04/2019   PLT 250 08/04/2019   Lab Results  Component Value Date   FERRITIN 1,917 (H) 08/04/2019   IRON 41 08/04/2019   TIBC 243 08/04/2019   UIBC 201 08/04/2019   IRONPCTSAT 17 (L) 08/04/2019   Lab Results  Component Value Date   RETICCTPCT 0.8 05/27/2019   RBC 4.14 08/04/2019   No results found for: KPAFRELGTCHN, LAMBDASER, KAPLAMBRATIO No results found for: IGGSERUM, IGA, IGMSERUM No results found for: Will Bonnet, GAMS, MSPIKE, SPEI   Chemistry      Component Value Date/Time   NA 133 (L) 08/04/2019 1315   NA 142 03/20/2019 1032   K 4.6 08/04/2019 1315   CL 97 (L) 08/04/2019 1315   CO2 25 08/04/2019 1315   BUN 53 (H) 08/04/2019 1315   BUN 43 (H) 03/20/2019 1032   CREATININE 1.63 (H) 08/04/2019 1315   CREATININE 1.15 (H) 10/29/2014 1608      Component Value Date/Time   CALCIUM 10.0 08/04/2019 1315   ALKPHOS 107 08/04/2019 1315   AST 22 08/04/2019 1315   ALT 15 08/04/2019 1315   BILITOT 0.6 08/04/2019 1315       Impression and Plan: [name] is a very pleasant 86 yo caucasian female with both iron deficiency anderythropoietin deficiency anemias. No Retacrit given, Hgb 11.8.   Iron studies are pending. We will plan to see her again in 1 month.  She will contact our office with any questions or concerns. We can certainly see her sooner if needed.  [name], NP 6/1/20211:28 PM

## 2019-09-01 NOTE — Progress Notes (Signed)
Remote pacemaker transmission.   

## 2019-09-02 ENCOUNTER — Telehealth: Payer: Self-pay | Admitting: Family

## 2019-09-02 LAB — CUP PACEART REMOTE DEVICE CHECK
Battery Remaining Longevity: 122 mo
Battery Voltage: 3.01 V
Brady Statistic AP VP Percent: 0 %
Brady Statistic AP VS Percent: 0 %
Brady Statistic AS VP Percent: 96.45 %
Brady Statistic AS VS Percent: 3.55 %
Brady Statistic RA Percent Paced: 0 %
Brady Statistic RV Percent Paced: 96.45 %
Date Time Interrogation Session: 20210601003816
Implantable Lead Implant Date: 20190524
Implantable Lead Implant Date: 20190524
Implantable Lead Location: 753859
Implantable Lead Location: 753860
Implantable Lead Model: 3830
Implantable Lead Model: 5076
Implantable Pulse Generator Implant Date: 20190524
Lead Channel Impedance Value: 361 Ohm
Lead Channel Impedance Value: 418 Ohm
Lead Channel Impedance Value: 475 Ohm
Lead Channel Impedance Value: 608 Ohm
Lead Channel Pacing Threshold Amplitude: 1 V
Lead Channel Pacing Threshold Pulse Width: 0.4 ms
Lead Channel Sensing Intrinsic Amplitude: 1.5 mV
Lead Channel Sensing Intrinsic Amplitude: 13 mV
Lead Channel Sensing Intrinsic Amplitude: 13 mV
Lead Channel Setting Pacing Amplitude: 2.5 V
Lead Channel Setting Pacing Pulse Width: 0.4 ms
Lead Channel Setting Sensing Sensitivity: 4 mV

## 2019-09-02 LAB — IRON AND TIBC
Iron: 54 ug/dL (ref 28–170)
Saturation Ratios: 19 % (ref 10.4–31.8)
TIBC: 286 ug/dL (ref 250–450)
UIBC: 232 ug/dL

## 2019-09-02 LAB — FERRITIN: Ferritin: 1226 ng/mL — ABNORMAL HIGH (ref 11–307)

## 2019-09-02 NOTE — Telephone Encounter (Signed)
Appointments scheduled calendar printed & mailed per 6/1 los 

## 2019-09-07 ENCOUNTER — Telehealth: Payer: Self-pay | Admitting: *Deleted

## 2019-09-07 NOTE — Telephone Encounter (Signed)
Pt's caregiver, Jennings Books states she saw pt yesterday for her birthday and pt has a blister on her foot, and is dressing it now, and has an appt 09/09/2019.

## 2019-09-07 NOTE — Telephone Encounter (Signed)
Left message informing Ann Fletcher, that if blister has not broken, she should keep the area clean and not break it, but if the blister does break, the should clean it daily with soap and water, rinse well and apply light coating of antibiotic ointment and a dressing. I told them to report to our office or go to the ED with any increased redness, swelling, redness or red streaks, or foul odor or fever, and to call with concerns.

## 2019-09-09 ENCOUNTER — Ambulatory Visit (INDEPENDENT_AMBULATORY_CARE_PROVIDER_SITE_OTHER): Payer: Medicare Other | Admitting: Podiatry

## 2019-09-09 ENCOUNTER — Other Ambulatory Visit: Payer: Self-pay

## 2019-09-09 ENCOUNTER — Encounter: Payer: Self-pay | Admitting: Podiatry

## 2019-09-09 DIAGNOSIS — N184 Chronic kidney disease, stage 4 (severe): Secondary | ICD-10-CM

## 2019-09-09 DIAGNOSIS — S92355A Nondisplaced fracture of fifth metatarsal bone, left foot, initial encounter for closed fracture: Secondary | ICD-10-CM

## 2019-09-09 DIAGNOSIS — E119 Type 2 diabetes mellitus without complications: Secondary | ICD-10-CM

## 2019-09-09 DIAGNOSIS — B351 Tinea unguium: Secondary | ICD-10-CM

## 2019-09-09 DIAGNOSIS — M79676 Pain in unspecified toe(s): Secondary | ICD-10-CM

## 2019-09-09 DIAGNOSIS — D689 Coagulation defect, unspecified: Secondary | ICD-10-CM

## 2019-09-09 NOTE — Progress Notes (Signed)
This patient returns to my office for at risk foot care.  This patient requires this care by a professional since this patient will be at risk due to having type 2 diabetes with renal manifestations.  This patient is unable to cut nails herself since the patient cannot reach her nails.These nails are painful walking and wearing shoes.  This patient presents for at risk foot care today. She presents to the office with her caregiver.  This patient had an injury to her left foot which was diagnosed as fifth metabse fracture.  She says she is having no pain at fracture site but swelling is present left foot/ankle.  Her caregiver says she has developed a red lesion left foot.  She presents for evaluation of left foot and nail care.  General Appearance  Alert, conversant and in no acute stress.  Vascular  Dorsalis pedis and posterior tibial  pulses are palpable  bilaterally.  Capillary return is within normal limits  bilaterally. Temperature is within normal limits  bilaterally.  Neurologic  Senn-Weinstein monofilament wire test within normal limits  bilaterally. Muscle power within normal limits bilaterally.  Nails Thick disfigured discolored nails with subungual debris  from second  to fifth toes bilaterally. No evidence of bacterial infection or drainage bilaterally.  Orthopedic  No limitations of motion  feet .  No crepitus or effusions noted.  No bony pathology or digital deformities noted. HAV  B/L.  Hammer toe  Skin  normotropic skin with no porokeratosis noted bilaterally.  No signs of infections or ulcers noted.   Red skin lesion fifth metabase left foot.  Onychomycosis  Pain in right toes  Pain in left toes  Closed metatarsal fracture left foot.  Consent was obtained for treatment procedures.   Mechanical debridement of nails 1-5  bilaterally performed with a nail nipper.  Filed with dremel without incident.  Dispensed large anklet for swelling.  Discussed red lesion left foot.  It appears this  lesion developed while wearing her sneakers with thick ace bandage.  Recommended gel dispersion pad.     Return office visit   3 months                  Told patient to return for periodic foot care and evaluation due to potential at risk complications.   Gardiner Barefoot DPM

## 2019-10-01 ENCOUNTER — Other Ambulatory Visit (HOSPITAL_COMMUNITY): Payer: Self-pay | Admitting: Sports Medicine

## 2019-10-01 ENCOUNTER — Inpatient Hospital Stay: Payer: Medicare Other | Attending: Hematology & Oncology

## 2019-10-01 ENCOUNTER — Other Ambulatory Visit: Payer: Self-pay | Admitting: Sports Medicine

## 2019-10-01 ENCOUNTER — Inpatient Hospital Stay: Payer: Medicare Other

## 2019-10-01 ENCOUNTER — Other Ambulatory Visit: Payer: Self-pay

## 2019-10-01 ENCOUNTER — Other Ambulatory Visit: Payer: Self-pay | Admitting: *Deleted

## 2019-10-01 DIAGNOSIS — D500 Iron deficiency anemia secondary to blood loss (chronic): Secondary | ICD-10-CM

## 2019-10-01 DIAGNOSIS — M545 Low back pain, unspecified: Secondary | ICD-10-CM

## 2019-10-01 DIAGNOSIS — D631 Anemia in chronic kidney disease: Secondary | ICD-10-CM

## 2019-10-01 LAB — CBC WITH DIFFERENTIAL (CANCER CENTER ONLY)
Abs Immature Granulocytes: 0.04 10*3/uL (ref 0.00–0.07)
Basophils Absolute: 0.1 10*3/uL (ref 0.0–0.1)
Basophils Relative: 1 %
Eosinophils Absolute: 0.1 10*3/uL (ref 0.0–0.5)
Eosinophils Relative: 1 %
HCT: 38.6 % (ref 36.0–46.0)
Hemoglobin: 12.3 g/dL (ref 12.0–15.0)
Immature Granulocytes: 1 %
Lymphocytes Relative: 21 %
Lymphs Abs: 1.8 10*3/uL (ref 0.7–4.0)
MCH: 28.3 pg (ref 26.0–34.0)
MCHC: 31.9 g/dL (ref 30.0–36.0)
MCV: 88.9 fL (ref 80.0–100.0)
Monocytes Absolute: 0.8 10*3/uL (ref 0.1–1.0)
Monocytes Relative: 9 %
Neutro Abs: 5.8 10*3/uL (ref 1.7–7.7)
Neutrophils Relative %: 67 %
Platelet Count: 165 10*3/uL (ref 150–400)
RBC: 4.34 MIL/uL (ref 3.87–5.11)
RDW: 13.6 % (ref 11.5–15.5)
WBC Count: 8.5 10*3/uL (ref 4.0–10.5)
nRBC: 0 % (ref 0.0–0.2)

## 2019-10-07 ENCOUNTER — Ambulatory Visit (INDEPENDENT_AMBULATORY_CARE_PROVIDER_SITE_OTHER): Payer: Medicare Other

## 2019-10-07 ENCOUNTER — Other Ambulatory Visit: Payer: Self-pay

## 2019-10-07 ENCOUNTER — Ambulatory Visit (INDEPENDENT_AMBULATORY_CARE_PROVIDER_SITE_OTHER): Payer: Medicare Other | Admitting: Podiatry

## 2019-10-07 ENCOUNTER — Encounter: Payer: Self-pay | Admitting: Podiatry

## 2019-10-07 DIAGNOSIS — S92355A Nondisplaced fracture of fifth metatarsal bone, left foot, initial encounter for closed fracture: Secondary | ICD-10-CM

## 2019-10-07 NOTE — Progress Notes (Signed)
   HPI: 86 y.o. female presenting today for follow up evaluation of a fractured left 5th metatarsal.  Patient states that she is doing very well.  She has been minimally weightbearing and applying an Ace daily to the left foot.  She denies pain.  Otherwise no new complaints at this time   Past Medical History:  Diagnosis Date  . Anxiety   . Arthritis   . AV node arrhythmia    a. s/p AV node ablation/PPM 07/2017.  [name] Chronic diastolic CHF (congestive heart failure) ([place])    a. Acute exacerbation occurred in setting of AF.  [name] CKD (chronic kidney disease), stage III   . Diabetes mellitus without complication ([place])    type 2  . 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 H/O bladder infections   . Hyperlipidemia   . Hypertension   . Hypothyroidism   . Lower extremity edema   . Osteoporosis   . Persistent atrial fibrillation ([place])    a. diagnosed in 10/2014 by PCP, underwent failed TEE DCCV on 10/08/14, loaded with amio, successfully DCCV on 10/15/14.     Physical Exam: General: The patient is alert and oriented x3 in no acute distress.  Dermatology: Skin is warm, dry and supple bilateral lower extremities. Negative for open lesions or macerations.  Vascular: Palpable pedal pulses bilaterally. No edema or erythema noted. Capillary refill within normal limits.  Neurological: Epicritic and protective threshold grossly intact bilaterally.   Musculoskeletal Exam: Negative for pain with palpation noted to the 5th metatarsal of the left foot. Range of motion within normal limits to all pedal and ankle joints bilateral. Muscle strength 5/5 in all groups bilateral.   Radiographic Exam: Closed, nondisplaced fracture of the 5th metatarsal diaphysis of the left foot that appears to be healing routinely.  Callus formation noted along the fracture line.  Assessment: 1. Fracture 5th metatarsal diaphysis left, closed, nondisplaced, with routine healing   Plan of Care:  1. Patient evaluated.  X-Rays reviewed.  2.  Patient may discontinue the Ace wrap 3.  Recommend good supportive shoes 4.  Patient may begin ambulating and weightbearing as needed daily.  Full activity no restrictions.  Fracture appears to be mostly healed 5.  Return to clinic as needed   Edrick Kins, DPM [facility]  Dr. Edrick Kins, DPM    [address], [place] [address]                Office [phone]  Fax [phone]

## 2019-10-09 ENCOUNTER — Encounter (HOSPITAL_COMMUNITY): Payer: Self-pay

## 2019-10-09 ENCOUNTER — Ambulatory Visit (HOSPITAL_COMMUNITY)
Admission: RE | Admit: 2019-10-09 | Discharge: 2019-10-09 | Disposition: A | Discharge status: Home / Self Care | Payer: Medicare Other | Source: Ambulatory Visit | Attending: Sports Medicine | Admitting: Sports Medicine

## 2019-10-09 ENCOUNTER — Other Ambulatory Visit: Payer: Self-pay

## 2019-10-09 DIAGNOSIS — M4856XA Collapsed vertebra, not elsewhere classified, lumbar region, initial encounter for fracture: Secondary | ICD-10-CM | POA: Diagnosis not present

## 2019-10-09 DIAGNOSIS — M48061 Spinal stenosis, lumbar region without neurogenic claudication: Secondary | ICD-10-CM | POA: Diagnosis not present

## 2019-10-09 DIAGNOSIS — M545 Low back pain, unspecified: Secondary | ICD-10-CM

## 2019-10-09 DIAGNOSIS — M5126 Other intervertebral disc displacement, lumbar region: Secondary | ICD-10-CM | POA: Diagnosis not present

## 2019-10-09 DIAGNOSIS — M47816 Spondylosis without myelopathy or radiculopathy, lumbar region: Secondary | ICD-10-CM | POA: Diagnosis not present

## 2019-10-12 DIAGNOSIS — M48061 Spinal stenosis, lumbar region without neurogenic claudication: Secondary | ICD-10-CM | POA: Diagnosis not present

## 2019-10-13 ENCOUNTER — Other Ambulatory Visit: Payer: Self-pay | Admitting: Sports Medicine

## 2019-10-13 DIAGNOSIS — M48061 Spinal stenosis, lumbar region without neurogenic claudication: Secondary | ICD-10-CM

## 2019-10-15 ENCOUNTER — Other Ambulatory Visit: Payer: Self-pay | Admitting: Sports Medicine

## 2019-10-15 ENCOUNTER — Other Ambulatory Visit: Payer: Self-pay

## 2019-10-15 ENCOUNTER — Ambulatory Visit
Admission: RE | Admit: 2019-10-15 | Discharge: 2019-10-15 | Disposition: A | Discharge status: Home / Self Care | Payer: Medicare Other | Source: Ambulatory Visit | Attending: Sports Medicine | Admitting: Sports Medicine

## 2019-10-15 DIAGNOSIS — M5126 Other intervertebral disc displacement, lumbar region: Secondary | ICD-10-CM | POA: Diagnosis not present

## 2019-10-15 DIAGNOSIS — M48061 Spinal stenosis, lumbar region without neurogenic claudication: Secondary | ICD-10-CM

## 2019-10-15 MED ORDER — IOPAMIDOL (ISOVUE-M 200) INJECTION 41%
1.0000 mL | Freq: Once | INTRAMUSCULAR | Status: AC
  Administered 2019-10-15: 1 mL via EPIDURAL

## 2019-10-15 MED ORDER — METHYLPREDNISOLONE ACETATE 40 MG/ML INJ SUSP (RADIOLOG
120.0000 mg | Freq: Once | INTRAMUSCULAR | Status: AC
  Administered 2019-10-15: 120 mg via EPIDURAL

## 2019-10-15 NOTE — Discharge Instructions (Signed)

## 2019-10-20 ENCOUNTER — Other Ambulatory Visit: Payer: Self-pay

## 2019-10-20 ENCOUNTER — Encounter: Payer: Self-pay | Admitting: Cardiovascular Disease

## 2019-10-20 ENCOUNTER — Ambulatory Visit (INDEPENDENT_AMBULATORY_CARE_PROVIDER_SITE_OTHER): Payer: Medicare Other | Admitting: Cardiovascular Disease

## 2019-10-20 DIAGNOSIS — E782 Mixed hyperlipidemia: Secondary | ICD-10-CM

## 2019-10-20 DIAGNOSIS — I5032 Chronic diastolic (congestive) heart failure: Secondary | ICD-10-CM | POA: Diagnosis not present

## 2019-10-20 DIAGNOSIS — I4819 Other persistent atrial fibrillation: Secondary | ICD-10-CM

## 2019-10-20 DIAGNOSIS — I10 Essential (primary) hypertension: Secondary | ICD-10-CM | POA: Diagnosis not present

## 2019-10-20 NOTE — Assessment & Plan Note (Signed)
History of persistent atrial fibrillation on oral anticoagulations which were stopped because of GI bleeding.

## 2019-10-20 NOTE — Assessment & Plan Note (Signed)
History of hyperlipidemia on statin therapy with lipid profile performed 06/16/2019 revealing a total cholesterol 155 and LDL 51.

## 2019-10-20 NOTE — Progress Notes (Signed)
10/20/2019 Ann Fletcher   1933/02/01  [id number]  Primary Physician [name], MD Primary Cardiologist: Lorretta Harp MD [name], [place]  HPI:  Ann Fletcher is a 86 y.o.  moderately overweight married Caucasian female whose husbandAddisonis also a patient of mine as well and he accompanies her today.. She is a mother of 3 and grandmother and 5 grandchildren. She was referred by Dr. Ashby Dawes and for preoperative clearance before elective back surgery. I last saw her in the office 03/11/2019.[name] They are accompanied by their adopted daughter Jennings Books .  Her cardiac risk factor profile is notable for treated hypertension, diabetes and hyperlipidemia. She does have bilateral looks to me edema. There is no family history of heart disease. She's never had a heart attack or stroke. She denies chest pain or shortness of breath. She apparently has back issues which may require surgery and was referred here for cardiovascular surgical clearance. She was hospitalized at [facility] from July 7 through the 15th with A. Fib with RVR and m edema. She was diuresed, loaded with amiodarone and ultimately cardioverted by Dr. Marlou Porch successfully after 3 shocks which ultimately did not hold. She saw edema and time PA-C in the office 10/29/14 in follow-up. Her heart rate at that time was 100. Today she feels somewhat weak and nauseated. Her EKG shows junctional bradycardia 40. She is on Eliquis . I'mdecreased her amiodarone and referred her to the [name] where she was seen by Roderic Palau nurse practitioner.   She was on amiodarone which was discontinued because of pulmonary fibrosis. She had continued A. fib with RVR and ultimately underwent AV nodal ablation by Dr. Crissie Sickles 08/23/2017 with implantation of a permanent transvenous pacemaker. Her symptoms improved. She was on Eliquis which was discontinued because of GI bleed and persistent anemia. She also has had  diastolic heart failure and has been on oral diuretics which have been adjusted as well as hypokalemia now on a stable dose of potassium repletion.  Since I saw her a year ago she is done well.  Her weight has remained stable.  She is on oral diuretics.  She does not have any peripheral edema today.  She is somewhat unsteady on her feet however.   Current Meds  Medication Sig  . albuterol (PROVENTIL HFA;VENTOLIN HFA) 108 (90 BASE) MCG/ACT inhaler Inhale 2 puffs into the lungs 3 (three) times daily as needed for wheezing or shortness of breath.   [name] aspirin 81 MG chewable tablet Chew 81 mg by mouth daily.  [name] atorvastatin (LIPITOR) 40 MG tablet Take 40 mg by mouth at bedtime.   . Calcium Carbonate-Vitamin D (OSCAL 500/200 D-3 PO) Take 1 tablet by mouth daily.   . cephALEXin (KEFLEX) 500 MG capsule Take 500 mg by mouth 2 (two) times daily.  . fluticasone (FLONASE) 50 MCG/ACT nasal spray Place 1 spray into both nostrils daily as needed for allergies.   . furosemide (LASIX) 40 MG tablet Take 40 mg by mouth at bedtime.  . furosemide (LASIX) 80 MG tablet TAKE ONE WHOLE TABLET (80MG ) IN THE MORNING AND HALF TABLET (40 MG) IN THE EVENING.  [name] glimepiride (AMARYL) 2 MG tablet Take 2 mg by mouth every morning.  [name] levothyroxine (SYNTHROID, LEVOTHROID) 100 MCG tablet Take 100 mcg by mouth daily before breakfast.   . LORazepam (ATIVAN) 1 MG tablet Take 1 mg by mouth 3 (three) times daily as needed for anxiety.   . potassium chloride  SA (K-DUR,KLOR-CON) 20 MEQ tablet Take 1 tablet (20 mEq total) by mouth daily.  . vitamin E (VITAMIN E) 400 UNIT capsule Take 400 Units by mouth daily.     Allergies  Allergen Reactions  . Tizanidine Hcl Other (See Comments)    Dizziness, patient reports fall  . Nsaids Itching and Other (See Comments)    Stomach cramps   . Pradaxa [Dabigatran Etexilate Mesylate] Nausea And Vomiting  . Sulfa Antibiotics Nausea And Vomiting  . Betadine [Povidone Iodine] Itching and Rash     Social History   Socioeconomic History  . Marital status: Married    Spouse name: Not on file  . Number of children: Not on file  . Years of education: Not on file  . Highest education level: Not on file  Occupational History  . Not on file  Tobacco Use  . Smoking status: Never Smoker  . Smokeless tobacco: Never Used  Vaping Use  . Vaping Use: Never used  Substance and Sexual Activity  . Alcohol use: No  . Drug use: No  . Sexual activity: Yes    Birth control/protection: Surgical  Other Topics Concern  . Not on file  Social History Narrative  . Not on file   Social Determinants of Health   Financial Resource Strain:   . Difficulty of Paying Living Expenses:   Food Insecurity:   . Worried About Charity fundraiser in the Last Year:   . Arboriculturist in the Last Year:   Transportation Needs:   . Film/video editor (Medical):   [name] Lack of Transportation (Non-Medical):   Physical Activity:   . Days of Exercise per Week:   . Minutes of Exercise per Session:   Stress:   . Feeling of Stress :   Social Connections:   . Frequency of Communication with Friends and Family:   . Frequency of Social Gatherings with Friends and Family:   . Attends Religious Services:   . Active Member of Clubs or Organizations:   . Attends Archivist Meetings:   [name] Marital Status:   Intimate Partner Violence:   . Fear of Current or Ex-Partner:   . Emotionally Abused:   [name] Physically Abused:   . Sexually Abused:      Review of Systems: General: negative for chills, fever, night sweats or weight changes.  Cardiovascular: negative for chest pain, dyspnea on exertion, edema, orthopnea, palpitations, paroxysmal nocturnal dyspnea or shortness of breath Dermatological: negative for rash Respiratory: negative for cough or wheezing Urologic: negative for hematuria Abdominal: negative for nausea, vomiting, diarrhea, bright red blood per rectum, melena, or hematemesis Neurologic:  negative for visual changes, syncope, or dizziness All other systems reviewed and are otherwise negative except as noted above.    Blood pressure 134/62, pulse 71, height 5\' 3"  (1.6 m), weight 142 lb (64.4 kg).  General appearance: alert and no distress Neck: no adenopathy, no carotid bruit, no JVD, supple, symmetrical, trachea midline and thyroid not enlarged, symmetric, no tenderness/mass/nodules Lungs: clear to auscultation bilaterally Heart: regular rate and rhythm, S1, S2 normal, no murmur, click, rub or gallop Extremities: extremities normal, atraumatic, no cyanosis or edema Pulses: 2+ and symmetric Skin: Skin color, texture, turgor normal. No rashes or lesions Neurologic: Alert and oriented X 3, normal strength and tone. Normal symmetric reflexes. Normal coordination and gait  EKG ventricular paced rhythm at 71.  I personally reviewed this EKG.  ASSESSMENT AND PLAN:   Essential hypertension History of essential  hypertension with blood pressure measured today 134/62.  She is on no antihypertensive medications.  Hyperlipidemia History of hyperlipidemia on statin therapy with lipid profile performed 06/16/2019 revealing a total cholesterol 155 and LDL 51.  Status post atrioventricular nodal ablation History of AV node ablation followed by for permanent transvenous pacemaker insertion by Dr. Lovena Le 08/23/2017 which he follows.  Chronic diastolic CHF (congestive heart failure) (HCC) History of chronic diastolic heart failure on oral diuretics.  Persistent atrial fibrillation ([facility]) History of persistent atrial fibrillation on oral anticoagulations which were stopped because of GI bleeding.      Lorretta Harp MD FACP,FACC,FAHA, [facility] 10/20/2019 4:42 PM

## 2019-10-20 NOTE — Patient Instructions (Signed)
Medication Instructions:  NO CHANGE *If you need a refill on your cardiac medications before your next appointment, please call your pharmacy*   Lab Work: If you have labs (blood work) drawn today and your tests are completely normal, you will receive your results only by: . MyChart Message (if you have MyChart) OR . A paper copy in the mail If you have any lab test that is abnormal or we need to change your treatment, we will call you to review the results.   Follow-Up: At [facility], you and your health needs are our priority.  As part of our continuing mission to provide you with exceptional heart care, we have created designated Provider Care Teams.  These Care Teams include your primary Cardiologist (physician) and Advanced Practice Providers (APPs -  Physician Assistants and Nurse Practitioners) who all work together to provide you with the care you need, when you need it.  We recommend signing up for the patient portal called "MyChart".  Sign up information is provided on this After Visit Summary.  MyChart is used to connect with patients for Virtual Visits (Telemedicine).  Patients are able to view lab/test results, encounter notes, upcoming appointments, etc.  Non-urgent messages can be sent to your provider as well.   To learn more about what you can do with MyChart, go to [email or web address].    Your next appointment:   12 month(s)  The format for your next appointment:   In Person  Provider:   You may see Jonathan Berry, MD or one of the following Advanced Practice Providers on your designated Care Team:    Luke Kilroy, PA-C  Callie Goodrich, PA-C  Jesse Cleaver, FNP     

## 2019-10-20 NOTE — Assessment & Plan Note (Signed)
History of AV node ablation followed by for permanent transvenous pacemaker insertion by Dr. Lovena Le 08/23/2017 which he follows.

## 2019-10-20 NOTE — Assessment & Plan Note (Signed)
History of essential hypertension with blood pressure measured today 134/62.  She is on no antihypertensive medications.

## 2019-10-20 NOTE — Assessment & Plan Note (Signed)
History of chronic diastolic heart failure on oral diuretics. 

## 2019-11-02 ENCOUNTER — Ambulatory Visit: Payer: Medicare Other | Admitting: Family

## 2019-11-02 ENCOUNTER — Ambulatory Visit: Payer: Medicare Other

## 2019-11-02 ENCOUNTER — Other Ambulatory Visit: Payer: Medicare Other

## 2019-11-05 ENCOUNTER — Other Ambulatory Visit: Payer: Self-pay

## 2019-11-05 ENCOUNTER — Inpatient Hospital Stay: Payer: Medicare Other

## 2019-11-05 ENCOUNTER — Encounter: Payer: Self-pay | Admitting: Family

## 2019-11-05 ENCOUNTER — Inpatient Hospital Stay: Payer: Medicare Other | Attending: Hematology & Oncology

## 2019-11-05 ENCOUNTER — Inpatient Hospital Stay (HOSPITAL_BASED_OUTPATIENT_CLINIC_OR_DEPARTMENT_OTHER): Payer: Medicare Other | Admitting: Family

## 2019-11-05 ENCOUNTER — Telehealth: Payer: Self-pay | Admitting: Family

## 2019-11-05 VITALS — BP 172/63 | HR 70 | Temp 97.9°F | Resp 19 | Ht 63.0 in | Wt 140.0 lb

## 2019-11-05 DIAGNOSIS — D631 Anemia in chronic kidney disease: Secondary | ICD-10-CM | POA: Diagnosis not present

## 2019-11-05 DIAGNOSIS — D500 Iron deficiency anemia secondary to blood loss (chronic): Secondary | ICD-10-CM | POA: Diagnosis not present

## 2019-11-05 DIAGNOSIS — D6489 Other specified anemias: Secondary | ICD-10-CM | POA: Insufficient documentation

## 2019-11-05 DIAGNOSIS — K909 Intestinal malabsorption, unspecified: Secondary | ICD-10-CM | POA: Diagnosis not present

## 2019-11-05 LAB — CMP (CANCER CENTER ONLY)
ALT: 21 U/L (ref 0–44)
AST: 24 U/L (ref 15–41)
Albumin: 4.4 g/dL (ref 3.5–5.0)
Alkaline Phosphatase: 82 U/L (ref 38–126)
Anion gap: 8 (ref 5–15)
BUN: 42 mg/dL — ABNORMAL HIGH (ref 8–23)
CO2: 31 mmol/L (ref 22–32)
Calcium: 10.6 mg/dL — ABNORMAL HIGH (ref 8.9–10.3)
Chloride: 100 mmol/L (ref 98–111)
Creatinine: 1.29 mg/dL — ABNORMAL HIGH (ref 0.44–1.00)
GFR, Est AFR Am: 44 mL/min — ABNORMAL LOW (ref >60)
GFR, Estimated: 38 mL/min — ABNORMAL LOW (ref >60)
Glucose, Bld: 94 mg/dL (ref 70–99)
Potassium: 4.4 mmol/L (ref 3.5–5.1)
Sodium: 139 mmol/L (ref 135–145)
Total Bilirubin: 0.6 mg/dL (ref 0.3–1.2)
Total Protein: 7.7 g/dL (ref 6.5–8.1)

## 2019-11-05 LAB — CBC WITH DIFFERENTIAL (CANCER CENTER ONLY)
Abs Immature Granulocytes: 0.05 10*3/uL (ref 0.00–0.07)
Basophils Absolute: 0.1 10*3/uL (ref 0.0–0.1)
Basophils Relative: 0 %
Eosinophils Absolute: 0.1 10*3/uL (ref 0.0–0.5)
Eosinophils Relative: 1 %
HCT: 36.9 % (ref 36.0–46.0)
Hemoglobin: 12.1 g/dL (ref 12.0–15.0)
Immature Granulocytes: 0 %
Lymphocytes Relative: 18 %
Lymphs Abs: 2 10*3/uL (ref 0.7–4.0)
MCH: 29.4 pg (ref 26.0–34.0)
MCHC: 32.8 g/dL (ref 30.0–36.0)
MCV: 89.6 fL (ref 80.0–100.0)
Monocytes Absolute: 1 10*3/uL (ref 0.1–1.0)
Monocytes Relative: 9 %
Neutro Abs: 8 10*3/uL — ABNORMAL HIGH (ref 1.7–7.7)
Neutrophils Relative %: 72 %
Platelet Count: 164 10*3/uL (ref 150–400)
RBC: 4.12 MIL/uL (ref 3.87–5.11)
RDW: 14.4 % (ref 11.5–15.5)
WBC Count: 11.3 10*3/uL — ABNORMAL HIGH (ref 4.0–10.5)
nRBC: 0 % (ref 0.0–0.2)

## 2019-11-05 LAB — RETICULOCYTES
Immature Retic Fract: 7.7 % (ref 2.3–15.9)
RBC.: 4.02 MIL/uL (ref 3.87–5.11)
Retic Count, Absolute: 49 10*3/uL (ref 19.0–186.0)
Retic Ct Pct: 1.2 % (ref 0.4–3.1)

## 2019-11-05 NOTE — Telephone Encounter (Signed)
Appointments scheduled calendar printed & mailed per 8/5 los

## 2019-11-05 NOTE — Progress Notes (Signed)
Hematology and Oncology Follow Up Visit  Ann Fletcher [id number] [birth year] 86 y.o. 11/05/2019   Principle Diagnosis:  Iron deficiency anemia -- iron malabsorption and chronic blood loss Erythropoitin deficiencyanemia  Current Therapy: IV iron as indicated Retacrit 40,000 unit SQ as indicatedfor Hgb < 11   Interim History:  Ann Fletcher is here today with her daughter for follow-up. Hgb is stable at 12.1, MCV 89.  She has not noted any blood loss. No petechiae.  No fever, chills, n/v, cough, rash, dizziness, SOB, chest pain, palpitations, abdominal pain or changes in bowel or bladder habits.  No swelling, tenderness, numbness or tingling in her extremities.  She is ambulating with her walker at home for added support. No syncopal episode to report.  She is eating well and hydrating at home. Her weight is stable at 140 lbs.   ECOG Performance Status: 1 - Symptomatic but completely ambulatory  Medications:  Allergies as of 11/05/2019      Reactions   Tizanidine Hcl Other (See Comments)   Dizziness, patient reports fall   Nsaids Itching, Other (See Comments)   Stomach cramps   Pradaxa [dabigatran Etexilate Mesylate] Nausea And Vomiting   Sulfa Antibiotics Nausea And Vomiting   Betadine [povidone Iodine] Itching, Rash      Medication List       Accurate as of November 05, 2019  1:51 PM. If you have any questions, ask your nurse or doctor.        albuterol 108 (90 Base) MCG/ACT inhaler Commonly known as: VENTOLIN HFA Inhale 2 puffs into the lungs 3 (three) times daily as needed for wheezing or shortness of breath.   aspirin 81 MG chewable tablet Chew 81 mg by mouth daily.   atorvastatin 40 MG tablet Commonly known as: LIPITOR Take 40 mg by mouth at bedtime.   cephALEXin 500 MG capsule Commonly known as: KEFLEX Take 500 mg by mouth 2 (two) times daily.   fluticasone 50 MCG/ACT nasal spray Commonly known as: FLONASE Place 1 spray into both nostrils daily as  needed for allergies.   furosemide 80 MG tablet Commonly known as: LASIX TAKE ONE WHOLE TABLET (80MG ) IN THE MORNING AND HALF TABLET (40 MG) IN THE EVENING.   furosemide 40 MG tablet Commonly known as: LASIX Take 40 mg by mouth at bedtime.   glimepiride 2 MG tablet Commonly known as: AMARYL Take 2 mg by mouth every morning.   levothyroxine 100 MCG tablet Commonly known as: SYNTHROID Take 100 mcg by mouth daily before breakfast.   LORazepam 1 MG tablet Commonly known as: ATIVAN Take 1 mg by mouth 3 (three) times daily as needed for anxiety.   OSCAL 500/200 D-3 PO Take 1 tablet by mouth daily.   potassium chloride SA 20 MEQ tablet Commonly known as: KLOR-CON Take 1 tablet (20 mEq total) by mouth daily.   TYLENOL 500 MG tablet Generic drug: acetaminophen Take 500 mg by mouth every 6 (six) hours as needed (for arthritis).   vitamin E 180 MG (400 UNITS) capsule Generic drug: vitamin E Take 400 Units by mouth daily.       Allergies:  Allergies  Allergen Reactions  . Tizanidine Hcl Other (See Comments)    Dizziness, patient reports fall  . Nsaids Itching and Other (See Comments)    Stomach cramps   . Pradaxa [Dabigatran Etexilate Mesylate] Nausea And Vomiting  . Sulfa Antibiotics Nausea And Vomiting  . Betadine [Povidone Iodine] Itching and Rash    Past Medical History,  Surgical history, Social history, and Family History were reviewed and updated.  Review of Systems: All other 10 point review of systems is negative.   Physical Exam:  height is 5\' 3"  (1.6 m) and weight is 140 lb (63.5 kg). Her oral temperature is 97.9 F (36.6 C). Her blood pressure is 172/63 (abnormal) and her pulse is 70. Her respiration is 19 and oxygen saturation is 100%.   Wt Readings from Last 3 Encounters:  11/05/19 140 lb (63.5 kg)  10/20/19 142 lb (64.4 kg)  09/01/19 140 lb 4 oz (63.6 kg)    Ocular: Sclerae unicteric, pupils equal, round and reactive to light Ear-nose-throat:  Oropharynx clear, dentition fair Lymphatic: No cervical or supraclavicular adenopathy Lungs no rales or rhonchi, good excursion bilaterally Heart regular rate and rhythm, no murmur appreciated Abd soft, nontender, positive bowel sounds, no liver or spleen tip palpated on exam, no fluid wave  MSK no focal spinal tenderness, no joint edema Neuro: non-focal, well-oriented, appropriate affect Breasts: Deferred   Lab Results  Component Value Date   WBC 11.3 (H) 11/05/2019   HGB 12.1 11/05/2019   HCT 36.9 11/05/2019   MCV 89.6 11/05/2019   PLT 164 11/05/2019   Lab Results  Component Value Date   FERRITIN 1,226 (H) 09/01/2019   IRON 54 09/01/2019   TIBC 286 09/01/2019   UIBC 232 09/01/2019   IRONPCTSAT 19 09/01/2019   Lab Results  Component Value Date   RETICCTPCT 1.2 11/05/2019   RBC 4.12 11/05/2019   RBC 4.02 11/05/2019   No results found for: KPAFRELGTCHN, LAMBDASER, KAPLAMBRATIO No results found for: IGGSERUM, IGA, IGMSERUM No results found for: Odetta Pink, SPEI   Chemistry      Component Value Date/Time   NA 138 09/01/2019 1322   NA 142 03/20/2019 1032   K 4.1 09/01/2019 1322   CL 101 09/01/2019 1322   CO2 26 09/01/2019 1322   BUN 43 (H) 09/01/2019 1322   BUN 43 (H) 03/20/2019 1032   CREATININE 1.56 (H) 09/01/2019 1322   CREATININE 1.15 (H) 10/29/2014 1608      Component Value Date/Time   CALCIUM 10.2 09/01/2019 1322   ALKPHOS 128 (H) 09/01/2019 1322   AST 27 09/01/2019 1322   ALT 17 09/01/2019 1322   BILITOT 0.5 09/01/2019 1322       Impression and Plan: Ann Fletcher is a very pleasant 86 yo caucasian female with both iron deficiency anderythropoietin deficiency anemias. No Retacrit needed for Hgb 12.1.  We will see what her iron studies look like and replace if needed.  We will see her again in another month.  She can contact our office with any questions or concerns.   [name],  NP 8/5/20211:51 PM

## 2019-11-06 LAB — IRON AND TIBC
Iron: 75 ug/dL (ref 41–142)
Saturation Ratios: 27 % (ref 21–57)
TIBC: 279 ug/dL (ref 236–444)
UIBC: 204 ug/dL (ref 120–384)

## 2019-11-06 LAB — FERRITIN: Ferritin: 1389 ng/mL — ABNORMAL HIGH (ref 11–307)

## 2019-12-01 ENCOUNTER — Ambulatory Visit (INDEPENDENT_AMBULATORY_CARE_PROVIDER_SITE_OTHER): Payer: Medicare Other | Admitting: *Deleted

## 2019-12-01 DIAGNOSIS — I4819 Other persistent atrial fibrillation: Secondary | ICD-10-CM

## 2019-12-01 LAB — CUP PACEART REMOTE DEVICE CHECK
Battery Remaining Longevity: 116 mo
Battery Voltage: 3.01 V
Brady Statistic AP VP Percent: 0 %
Brady Statistic AP VS Percent: 0 %
Brady Statistic AS VP Percent: 97.99 %
Brady Statistic AS VS Percent: 2.01 %
Brady Statistic RA Percent Paced: 0 %
Brady Statistic RV Percent Paced: 97.99 %
Date Time Interrogation Session: 20210831003730
Implantable Lead Implant Date: 20190524
Implantable Lead Implant Date: 20190524
Implantable Lead Location: 753859
Implantable Lead Location: 753860
Implantable Lead Model: 3830
Implantable Lead Model: 5076
Implantable Pulse Generator Implant Date: 20190524
Lead Channel Impedance Value: 361 Ohm
Lead Channel Impedance Value: 361 Ohm
Lead Channel Impedance Value: 475 Ohm
Lead Channel Impedance Value: 551 Ohm
Lead Channel Pacing Threshold Amplitude: 0.875 V
Lead Channel Pacing Threshold Pulse Width: 0.4 ms
Lead Channel Sensing Intrinsic Amplitude: 1.5 mV
Lead Channel Sensing Intrinsic Amplitude: 6.25 mV
Lead Channel Sensing Intrinsic Amplitude: 6.25 mV
Lead Channel Setting Pacing Amplitude: 2.5 V
Lead Channel Setting Pacing Pulse Width: 0.4 ms
Lead Channel Setting Sensing Sensitivity: 4 mV

## 2019-12-02 NOTE — Progress Notes (Signed)
Remote pacemaker transmission.   

## 2019-12-08 ENCOUNTER — Inpatient Hospital Stay: Payer: Medicare Other | Admitting: Family

## 2019-12-08 ENCOUNTER — Inpatient Hospital Stay: Payer: Medicare Other | Attending: Hematology & Oncology

## 2019-12-08 ENCOUNTER — Inpatient Hospital Stay: Payer: Medicare Other

## 2019-12-16 ENCOUNTER — Ambulatory Visit (INDEPENDENT_AMBULATORY_CARE_PROVIDER_SITE_OTHER): Payer: Medicare Other | Admitting: Podiatry

## 2019-12-16 ENCOUNTER — Encounter: Payer: Self-pay | Admitting: Podiatry

## 2019-12-16 ENCOUNTER — Other Ambulatory Visit: Payer: Self-pay

## 2019-12-16 DIAGNOSIS — E119 Type 2 diabetes mellitus without complications: Secondary | ICD-10-CM | POA: Diagnosis not present

## 2019-12-16 DIAGNOSIS — M79676 Pain in unspecified toe(s): Secondary | ICD-10-CM | POA: Diagnosis not present

## 2019-12-16 DIAGNOSIS — D689 Coagulation defect, unspecified: Secondary | ICD-10-CM | POA: Diagnosis not present

## 2019-12-16 DIAGNOSIS — B351 Tinea unguium: Secondary | ICD-10-CM

## 2019-12-16 DIAGNOSIS — N184 Chronic kidney disease, stage 4 (severe): Secondary | ICD-10-CM

## 2019-12-16 NOTE — Progress Notes (Signed)
This patient returns to my office for at risk foot care.  This patient requires this care by a professional since this patient will be at risk due to having kidney disease and  Diabetes.   Patient is taking eliquiss.  This patient is unable to cut nails herself since the patient cannot reach her nails.These nails are painful walking and wearing shoes.  This patient presents for at risk foot care today.  General Appearance  Alert, conversant and in no acute stress.  Vascular  Dorsalis pedis and posterior tibial  pulses are palpable  bilaterally.  Capillary return is within normal limits  bilaterally. Temperature is within normal limits  bilaterally.  Neurologic  Senn-Weinstein monofilament wire test within normal limits  bilaterally. Muscle power within normal limits bilaterally.  Nails Thick disfigured discolored nails with subungual debris  from second  to fifth toes bilaterally. No evidence of bacterial infection or drainage bilaterally.  Orthopedic  No limitations of motion  feet .  No crepitus or effusions noted.  No bony pathology or digital deformities noted.  HAV  B/L.  Hammer toe second left.  Skin  normotropic skin with no porokeratosis noted bilaterally.  No signs of infections or ulcers noted.     Onychomycosis  Pain in right toes  Pain in left toes  Consent was obtained for treatment procedures.   Mechanical debridement of nails 2-5  bilaterally performed with a nail nipper.  Filed with dremel without incident. No infection or ulcer.     Return office visit 3 months.         Told patient to return for periodic foot care and evaluation due to potential at risk complications.   Gardiner Barefoot DPM

## 2019-12-17 ENCOUNTER — Ambulatory Visit (INDEPENDENT_AMBULATORY_CARE_PROVIDER_SITE_OTHER): Payer: Medicare Other | Admitting: Internal Medicine

## 2019-12-17 ENCOUNTER — Encounter: Payer: Self-pay | Admitting: Internal Medicine

## 2019-12-17 VITALS — BP 136/88 | HR 88 | Ht 63.0 in | Wt 142.6 lb

## 2019-12-17 DIAGNOSIS — I4819 Other persistent atrial fibrillation: Secondary | ICD-10-CM

## 2019-12-17 DIAGNOSIS — I10 Essential (primary) hypertension: Secondary | ICD-10-CM | POA: Diagnosis not present

## 2019-12-17 DIAGNOSIS — I442 Atrioventricular block, complete: Secondary | ICD-10-CM | POA: Diagnosis not present

## 2019-12-17 DIAGNOSIS — Z950 Presence of cardiac pacemaker: Secondary | ICD-10-CM | POA: Diagnosis not present

## 2019-12-17 NOTE — Patient Instructions (Signed)
Medication Instructions:  °Your physician recommends that you continue on your current medications as directed. Please refer to the Current Medication list given to you today. ° °Labwork: °None ordered. ° °Testing/Procedures: °None ordered. ° °Follow-Up: °Your physician wants you to follow-up in: one year with Dr. Taylor.   You will receive a reminder letter in the mail two months in advance. If you don't receive a letter, please call our office to schedule the follow-up appointment. ° °Remote monitoring is used to monitor your Pacemaker from home. This monitoring reduces the number of office visits required to check your device to one time per year. It allows us to keep an eye on the functioning of your device to ensure it is working properly. You are scheduled for a device check from home on 03/01/2020. You may send your transmission at any time that day. If you have a wireless device, the transmission will be sent automatically. After your physician reviews your transmission, you will receive a postcard with your next transmission date. ° °Any Other Special Instructions Will Be Listed Below (If Applicable). ° °If you need a refill on your cardiac medications before your next appointment, please call your pharmacy.  ° °

## 2019-12-17 NOTE — Progress Notes (Signed)
HPI Ann Fletcher returns today for followup of her atrial fib. She is a pleasant 86 yo woman with uncontrolled atrial fib who underwent AV node ablation and insertion of a DDD PM with atrial lead in the his bundle location. She has done well in the interim. She was noted to have non-capture of her His lead the day after her procedure. To obtain CHB, we had to ablate fairly close to her AV node. She developed exit block after the av node ablation.She has done well in the interim. She denies palpitations, chest pain or sob. She has mild dyspnea with exertion but she uses a walker.   She notes that she has had occasions where she is fallen and now uses the walker exclusively. Allergies  Allergen Reactions  . Tizanidine Hcl Other (See Comments)    Dizziness, patient reports fall  . Nsaids Itching and Other (See Comments)    Stomach cramps   . Pradaxa [Dabigatran Etexilate Mesylate] Nausea And Vomiting  . Sulfa Antibiotics Nausea And Vomiting  . Betadine [Povidone Iodine] Itching and Rash     Current Outpatient Medications  Medication Sig Dispense Refill  . acetaminophen (TYLENOL) 500 MG tablet Take 500 mg by mouth every 6 (six) hours as needed (for arthritis).    [name] albuterol (PROVENTIL HFA;VENTOLIN HFA) 108 (90 BASE) MCG/ACT inhaler Inhale 2 puffs into the lungs 3 (three) times daily as needed for wheezing or shortness of breath.     [name] aspirin 81 MG chewable tablet Chew 81 mg by mouth daily.    [name] atorvastatin (LIPITOR) 40 MG tablet Take 40 mg by mouth at bedtime.     . Calcium Carbonate-Vitamin D (OSCAL 500/200 D-3 PO) Take 1 tablet by mouth daily.     . cephALEXin (KEFLEX) 500 MG capsule Take 500 mg by mouth 2 (two) times daily.    . fluticasone (FLONASE) 50 MCG/ACT nasal spray Place 1 spray into both nostrils daily as needed for allergies.     . furosemide (LASIX) 40 MG tablet Take 40 mg by mouth at bedtime.    . furosemide (LASIX) 80 MG tablet TAKE ONE WHOLE TABLET (80MG ) IN THE  MORNING AND HALF TABLET (40 MG) IN THE EVENING. 45 tablet 5  . glimepiride (AMARYL) 2 MG tablet Take 2 mg by mouth every morning.    Elmore Guise Devices (PRODIGY LANCING DEVICE) MISC See admin instructions.    [name] levothyroxine (SYNTHROID, LEVOTHROID) 100 MCG tablet Take 100 mcg by mouth daily before breakfast.     . LORazepam (ATIVAN) 1 MG tablet Take 1 mg by mouth 3 (three) times daily as needed for anxiety.     . potassium chloride SA (K-DUR,KLOR-CON) 20 MEQ tablet Take 1 tablet (20 mEq total) by mouth daily. 90 tablet 3  . Prodigy Twist Top Lancets 28G MISC CHECK BLOOD GLUCOSE (SUGAR) TWICE DAILY    . vitamin E (VITAMIN E) 400 UNIT capsule Take 400 Units by mouth daily.     No current facility-administered medications for this visit.     Past Medical History:  Diagnosis Date  . Anxiety   . Arthritis   . AV node arrhythmia    a. s/p AV node ablation/PPM 07/2017.  [name] Chronic diastolic CHF (congestive heart failure) ([place])    a. Acute exacerbation occurred in setting of AF.  [name] CKD (chronic kidney disease), stage III   . Diabetes mellitus without complication ([place])    type 2  . 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  H/O bladder infections   . Hyperlipidemia   . Hypertension   . Hypothyroidism   . Lower extremity edema   . Osteoporosis   . Persistent atrial fibrillation ([place])    a. diagnosed in 10/2014 by PCP, underwent failed TEE DCCV on 10/08/14, loaded with amio, successfully DCCV on 10/15/14.    ROS:   All systems reviewed and negative except as noted in the HPI.   Past Surgical History:  Procedure Laterality Date  . AV NODE ABLATION N/A 08/23/2017   Procedure: AV NODE ABLATION;  Surgeon: [name], MD;  Location: [place] [facility];  Service: Cardiovascular;  Laterality: N/A;  . BACK SURGERY  2001, 2003, 2006   x 3  . BIOPSY  11/06/2017   Procedure: BIOPSY;  Surgeon: [name], MD;  Location: [place];  Service: Endoscopy;;  . CARDIOVERSION N/A 10/08/2014    Procedure: CARDIOVERSION;  Surgeon: [name], MD;  Location: [place];  Service: Cardiovascular;  Laterality: N/A;  . CARDIOVERSION N/A 10/15/2014   Procedure: CARDIOVERSION;  Surgeon: [name], MD;  Location: [facility] ENDOSCOPY;  Service: Cardiovascular;  Laterality: N/A;  . CARDIOVERSION N/A 11/15/2014   Procedure: CARDIOVERSION;  Surgeon: [name], MD;  Location: [place];  Service: Cardiovascular;  Laterality: N/A;  . COLONOSCOPY N/A 11/06/2017   Procedure: COLONOSCOPY;  Surgeon: [name], MD;  Location: [facility] ENDOSCOPY;  Service: Endoscopy;  Laterality: N/A;  . ESOPHAGOGASTRODUODENOSCOPY N/A 11/06/2017   Procedure: ESOPHAGOGASTRODUODENOSCOPY (EGD);  Surgeon: [name], MD;  Location[facility] END[facility];  Service: Endoscopy;  Laterality: N/A;  . PACEMAKER IMPLANT N/A 08/23/2017   Procedure: PACEMAKER IMPLANT;  Surgeon: [name], MD;  Location: [place] [facility];  Service: Cardiovascular;  Laterality: N/A;  . POLYPECTOMY  11/06/2017   Procedure: POLYPECTOMY;  Surgeon: [name], MD;  Location: [facility] ENDOSCOPY;  Service: Endoscopy;;  . 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 TEE WITHOUT CARDIOVERSION N/A 10/08/2014   Procedure: TRANSESOPHAGEAL ECHOCARDIOGRAM (TEE);  Surgeon: [name], MD;  Location: [facility] ENDOSCOPY;  Service: Cardiovascular;  Laterality: N/A;  . TUBAL LIGATION       Family History  Problem Relation Age of Onset  . Pneumonia Mother   . Heart attack Father   . Diabetes Sister      Social History   Socioeconomic History  . Marital status: Married    Spouse name: Not on file  . Number of children: Not on file  . Years of education: Not on file  . Highest education level: Not on file  Occupational History  . Not on file  Tobacco Use  . Smoking status: Never Smoker  . Smokeless tobacco: Never Used  Vaping Use  . Vaping Use: Never used  Substance and Sexual Activity  . Alcohol use: No  . Drug use: No  . Sexual activity: Yes    Birth  control/protection: Surgical  Other Topics Concern  . Not on file  Social History Narrative  . Not on file   Social Determinants of Health   Financial Resource Strain:   . Difficulty of Paying Living Expenses: Not on file  Food Insecurity:   . Worried About Charity fundraiser in the Last Year: Not on file  . Ran Out of Food in the Last Year: Not on file  Transportation Needs:   . Lack of Transportation (Medical): Not on file  . Lack of Transportation (Non-Medical): Not on file  Physical Activity:   . Days of Exercise per Week:  Not on file  . Minutes of Exercise per Session: Not on file  Stress:   . Feeling of Stress : Not on file  Social Connections:   . Frequency of Communication with Friends and Family: Not on file  . Frequency of Social Gatherings with Friends and Family: Not on file  . Attends Religious Services: Not on file  . Active Member of Clubs or Organizations: Not on file  . Attends Archivist Meetings: Not on file  . Marital Status: Not on file  Intimate Partner Violence:   . Fear of Current or Ex-Partner: Not on file  . Emotionally Abused: Not on file  . Physically Abused: Not on file  . Sexually Abused: Not on file     BP 136/88   Pulse 88   Ht 5\' 3"  (1.6 m)   Wt 142 lb 9.6 oz (64.7 kg)   SpO2 95%   BMI 25.26 kg/m   Physical Exam:  Well appearing NAD HEENT: Unremarkable Neck:  No JVD, no thyromegally Lymphatics:  No adenopathy Back:  No CVA tenderness Lungs:  Clear HEART:  Regular rate rhythm, no murmurs, no rubs, no clicks Abd:  soft, positive bowel sounds, no organomegally, no rebound, no guarding Ext:  2 plus pulses, no edema, no cyanosis, no clubbing Skin:  No rashes no nodules Neuro:  CN II through XII intact, motor grossly intact  DEVICE  Normal device function.  See PaceArt for details.   Assess/Plan: 1.  Chronic atrial fibrillation -her ventricular rates are well controlled.  She is status post AV node ablation. 2.   Pacemaker -her Medtronic dual-chamber pacemaker is programmed VVIR.  She did have exit block with her atrial lead placed in the His bundle region.  This is been turned off.  She does not appear to be symptomatic from ventricular only pacing. 3.  Falls -she is not on anticoagulation secondary to this.  She is not a particular good candidate for systemic anticoagulation due to her falls.  I have encouraged the patient to continue to use her walker exclusively when she ambulates. 4.  Hypertension -blood pressure is fairly well controlled.  She is encouraged to maintain a low-sodium diet.  Because of her propensity to fall, I have not recommended aggressive blood pressure lowering.  [name], MD

## 2019-12-22 DIAGNOSIS — I10 Essential (primary) hypertension: Secondary | ICD-10-CM | POA: Diagnosis not present

## 2019-12-22 DIAGNOSIS — E1121 Type 2 diabetes mellitus with diabetic nephropathy: Secondary | ICD-10-CM | POA: Diagnosis not present

## 2019-12-22 DIAGNOSIS — E1165 Type 2 diabetes mellitus with hyperglycemia: Secondary | ICD-10-CM | POA: Diagnosis not present

## 2019-12-22 DIAGNOSIS — N1832 Chronic kidney disease, stage 3b: Secondary | ICD-10-CM | POA: Diagnosis not present

## 2019-12-29 DIAGNOSIS — I130 Hypertensive heart and chronic kidney disease with heart failure and stage 1 through stage 4 chronic kidney disease, or unspecified chronic kidney disease: Secondary | ICD-10-CM | POA: Diagnosis not present

## 2019-12-29 DIAGNOSIS — I10 Essential (primary) hypertension: Secondary | ICD-10-CM | POA: Diagnosis not present

## 2019-12-29 DIAGNOSIS — E1121 Type 2 diabetes mellitus with diabetic nephropathy: Secondary | ICD-10-CM | POA: Diagnosis not present

## 2019-12-29 DIAGNOSIS — E1165 Type 2 diabetes mellitus with hyperglycemia: Secondary | ICD-10-CM | POA: Diagnosis not present

## 2019-12-29 DIAGNOSIS — Z23 Encounter for immunization: Secondary | ICD-10-CM | POA: Diagnosis not present

## 2020-02-01 ENCOUNTER — Ambulatory Visit: Payer: Medicare Other

## 2020-02-01 ENCOUNTER — Inpatient Hospital Stay (HOSPITAL_BASED_OUTPATIENT_CLINIC_OR_DEPARTMENT_OTHER): Payer: Medicare Other | Admitting: Family

## 2020-02-01 ENCOUNTER — Encounter: Payer: Self-pay | Admitting: Family

## 2020-02-01 ENCOUNTER — Other Ambulatory Visit: Payer: Self-pay

## 2020-02-01 ENCOUNTER — Inpatient Hospital Stay: Payer: Medicare Other | Attending: Hematology & Oncology

## 2020-02-01 VITALS — BP 192/76 | HR 84 | Temp 98.1°F | Resp 17 | Ht 63.0 in | Wt 147.8 lb

## 2020-02-01 DIAGNOSIS — D631 Anemia in chronic kidney disease: Secondary | ICD-10-CM | POA: Diagnosis not present

## 2020-02-01 DIAGNOSIS — D500 Iron deficiency anemia secondary to blood loss (chronic): Secondary | ICD-10-CM | POA: Diagnosis not present

## 2020-02-01 DIAGNOSIS — K909 Intestinal malabsorption, unspecified: Secondary | ICD-10-CM | POA: Insufficient documentation

## 2020-02-01 LAB — RETICULOCYTES
Immature Retic Fract: 10.6 % (ref 2.3–15.9)
RBC.: 3.89 MIL/uL (ref 3.87–5.11)
Retic Count, Absolute: 45.1 10*3/uL (ref 19.0–186.0)
Retic Ct Pct: 1.2 % (ref 0.4–3.1)

## 2020-02-01 LAB — CMP (CANCER CENTER ONLY)
ALT: 14 U/L (ref 0–44)
AST: 22 U/L (ref 15–41)
Albumin: 4.4 g/dL (ref 3.5–5.0)
Alkaline Phosphatase: 77 U/L (ref 38–126)
Anion gap: 9 (ref 5–15)
BUN: 40 mg/dL — ABNORMAL HIGH (ref 8–23)
CO2: 26 mmol/L (ref 22–32)
Calcium: 10.9 mg/dL — ABNORMAL HIGH (ref 8.9–10.3)
Chloride: 102 mmol/L (ref 98–111)
Creatinine: 1.42 mg/dL — ABNORMAL HIGH (ref 0.44–1.00)
GFR, Estimated: 36 mL/min — ABNORMAL LOW (ref >60)
Glucose, Bld: 128 mg/dL — ABNORMAL HIGH (ref 70–99)
Potassium: 5.1 mmol/L (ref 3.5–5.1)
Sodium: 137 mmol/L (ref 135–145)
Total Bilirubin: 0.4 mg/dL (ref 0.3–1.2)
Total Protein: 7.9 g/dL (ref 6.5–8.1)

## 2020-02-01 LAB — CBC WITH DIFFERENTIAL (CANCER CENTER ONLY)
Abs Immature Granulocytes: 0.1 10*3/uL — ABNORMAL HIGH (ref 0.00–0.07)
Basophils Absolute: 0.1 10*3/uL (ref 0.0–0.1)
Basophils Relative: 1 %
Eosinophils Absolute: 0.1 10*3/uL (ref 0.0–0.5)
Eosinophils Relative: 2 %
HCT: 35.6 % — ABNORMAL LOW (ref 36.0–46.0)
Hemoglobin: 11.4 g/dL — ABNORMAL LOW (ref 12.0–15.0)
Immature Granulocytes: 1 %
Lymphocytes Relative: 24 %
Lymphs Abs: 1.9 10*3/uL (ref 0.7–4.0)
MCH: 29.1 pg (ref 26.0–34.0)
MCHC: 32 g/dL (ref 30.0–36.0)
MCV: 90.8 fL (ref 80.0–100.0)
Monocytes Absolute: 0.9 10*3/uL (ref 0.1–1.0)
Monocytes Relative: 11 %
Neutro Abs: 4.9 10*3/uL (ref 1.7–7.7)
Neutrophils Relative %: 61 %
Platelet Count: 172 10*3/uL (ref 150–400)
RBC: 3.92 MIL/uL (ref 3.87–5.11)
RDW: 12.6 % (ref 11.5–15.5)
WBC Count: 8 10*3/uL (ref 4.0–10.5)
nRBC: 0 % (ref 0.0–0.2)

## 2020-02-01 NOTE — Progress Notes (Signed)
Hematology and Oncology Follow Up Visit  Ann Fletcher [id number] [birth year] 86 y.o. 02/01/2020   Principle Diagnosis:  Iron deficiency anemia -- iron malabsorption and chronic blood loss Erythropoitin deficiencyanemia  Current Therapy: IV iron as indicated Retacrit 40,000 unit SQ as indicatedfor Hgb < 11   Interim History:  Ann Fletcher is here today with her care giver for follow-up. She is doing well but is having a lot of back and joint aches with the cold weather.  She is resting well at night. No c/o fatigue.  No blood loss noted. No bruising or petechiae.  No fever, chills, n/v, cough, rash, dizziness, SOB, chest pain, palpitations, abdominal pain or changes in bowel or bladder habits.  No swelling, numbness or tingling in her extremities.  No falls or syncope. She is ambulating with her Rollator walker for added support.  She has maintained a good appetite and is staying well hydrated. Her weight is stable.   ECOG Performance Status: 1 - Symptomatic but completely ambulatory  Medications:  Allergies as of 02/01/2020      Reactions   Tizanidine Hcl Other (See Comments)   Dizziness, patient reports fall   Nsaids Itching, Other (See Comments)   Stomach cramps   Pradaxa [dabigatran Etexilate Mesylate] Nausea And Vomiting   Sulfa Antibiotics Nausea And Vomiting   Betadine [povidone Iodine] Itching, Rash      Medication List       Accurate as of February 01, 2020  2:19 PM. If you have any questions, ask your nurse or doctor.        albuterol 108 (90 Base) MCG/ACT inhaler Commonly known as: VENTOLIN HFA Inhale 2 puffs into the lungs 3 (three) times daily as needed for wheezing or shortness of breath.   aspirin 81 MG chewable tablet Chew 81 mg by mouth daily.   atorvastatin 40 MG tablet Commonly known as: LIPITOR Take 40 mg by mouth at bedtime.   cephALEXin 500 MG capsule Commonly known as: KEFLEX Take 500 mg by mouth 2 (two) times daily.   fluticasone  50 MCG/ACT nasal spray Commonly known as: FLONASE Place 1 spray into both nostrils daily as needed for allergies.   furosemide 80 MG tablet Commonly known as: LASIX TAKE ONE WHOLE TABLET (80MG ) IN THE MORNING AND HALF TABLET (40 MG) IN THE EVENING.   furosemide 40 MG tablet Commonly known as: LASIX Take 40 mg by mouth at bedtime.   glimepiride 2 MG tablet Commonly known as: AMARYL Take 2 mg by mouth every morning.   levothyroxine 100 MCG tablet Commonly known as: SYNTHROID Take 100 mcg by mouth daily before breakfast.   LORazepam 1 MG tablet Commonly known as: ATIVAN Take 1 mg by mouth 3 (three) times daily as needed for anxiety.   OSCAL 500/200 D-3 PO Take 1 tablet by mouth daily.   potassium chloride SA 20 MEQ tablet Commonly known as: KLOR-CON Take 1 tablet (20 mEq total) by mouth daily.   Prodigy Lancing Device Misc See admin instructions.   Prodigy Twist Top Lancets 28G Misc CHECK BLOOD GLUCOSE (SUGAR) TWICE DAILY   TYLENOL 500 MG tablet Generic drug: acetaminophen Take 500 mg by mouth every 6 (six) hours as needed (for arthritis).   vitamin E 180 MG (400 UNITS) capsule Generic drug: vitamin E Take 400 Units by mouth daily.       Allergies:  Allergies  Allergen Reactions  . Tizanidine Hcl Other (See Comments)    Dizziness, patient reports fall  . Nsaids  Itching and Other (See Comments)    Stomach cramps   . Pradaxa [Dabigatran Etexilate Mesylate] Nausea And Vomiting  . Sulfa Antibiotics Nausea And Vomiting  . Betadine [Povidone Iodine] Itching and Rash    Past Medical History, Surgical history, Social history, and Family History were reviewed and updated.  Review of Systems: All other 10 point review of systems is negative.   Physical Exam:  height is 5\' 3"  (1.6 m) and weight is 147 lb 12.8 oz (67 kg). Her oral temperature is 98.1 F (36.7 C). Her blood pressure is 192/76 (abnormal) and her pulse is 84. Her respiration is 17 and oxygen  saturation is 100%.   Wt Readings from Last 3 Encounters:  02/01/20 147 lb 12.8 oz (67 kg)  12/17/19 142 lb 9.6 oz (64.7 kg)  11/05/19 140 lb (63.5 kg)    Ocular: Sclerae unicteric, pupils equal, round and reactive to light Ear-nose-throat: Oropharynx clear, dentition fair Lymphatic: No cervical or supraclavicular adenopathy Lungs no rales or rhonchi, good excursion bilaterally Heart regular rate and rhythm, no murmur appreciated Abd soft, nontender, positive bowel sounds MSK no focal spinal tenderness, no joint edema Neuro: non-focal, well-oriented, appropriate affect Breasts: Deferred   Lab Results  Component Value Date   WBC 8.0 02/01/2020   HGB 11.4 (L) 02/01/2020   HCT 35.6 (L) 02/01/2020   MCV 90.8 02/01/2020   PLT 172 02/01/2020   Lab Results  Component Value Date   FERRITIN 1,389 (H) 11/05/2019   IRON 75 11/05/2019   TIBC 279 11/05/2019   UIBC 204 11/05/2019   IRONPCTSAT 27 11/05/2019   Lab Results  Component Value Date   RETICCTPCT 1.2 02/01/2020   RBC 3.89 02/01/2020   No results found for: KPAFRELGTCHN, LAMBDASER, KAPLAMBRATIO No results found for: IGGSERUM, IGA, IGMSERUM No results found for: Odetta Pink, SPEI   Chemistry      Component Value Date/Time   NA 137 02/01/2020 1328   NA 142 03/20/2019 1032   K 5.1 02/01/2020 1328   CL 102 02/01/2020 1328   CO2 26 02/01/2020 1328   BUN 40 (H) 02/01/2020 1328   BUN 43 (H) 03/20/2019 1032   CREATININE 1.42 (H) 02/01/2020 1328   CREATININE 1.15 (H) 10/29/2014 1608      Component Value Date/Time   CALCIUM 10.9 (H) 02/01/2020 1328   ALKPHOS 77 02/01/2020 1328   AST 22 02/01/2020 1328   ALT 14 02/01/2020 1328   BILITOT 0.4 02/01/2020 1328       Impression and Plan: Ann Fletcher is a very pleasant 86 yo caucasian female with both iron deficiency anderythropoietin deficiency anemias. No Retacrit needed this visit for Hgb 11.4.  Iron studies are  pending. We can replace if needed.  Follow-up in 1 month.  They were encouraged to contact our office with any questions or concerns.   Ann Peace, NP 11/1/20212:19 PM

## 2020-02-02 LAB — IRON AND TIBC
Iron: 68 ug/dL (ref 41–142)
Saturation Ratios: 25 % (ref 21–57)
TIBC: 273 ug/dL (ref 236–444)
UIBC: 204 ug/dL (ref 120–384)

## 2020-02-02 LAB — FERRITIN: Ferritin: 876 ng/mL — ABNORMAL HIGH (ref 11–307)

## 2020-02-07 ENCOUNTER — Other Ambulatory Visit: Payer: Self-pay

## 2020-02-07 ENCOUNTER — Emergency Department (HOSPITAL_COMMUNITY): Payer: Medicare Other

## 2020-02-07 ENCOUNTER — Encounter (HOSPITAL_COMMUNITY): Payer: Self-pay | Admitting: *Deleted

## 2020-02-07 ENCOUNTER — Inpatient Hospital Stay (HOSPITAL_COMMUNITY)
Admission: EM | Admit: 2020-02-07 | Discharge: 2020-02-11 | DRG: 481 | Disposition: A | Discharge status: Skilled Nursing Facility | Payer: Medicare Other | Attending: Internal Medicine | Admitting: Internal Medicine

## 2020-02-07 DIAGNOSIS — Z79890 Hormone replacement therapy (postmenopausal): Secondary | ICD-10-CM

## 2020-02-07 DIAGNOSIS — S299XXA Unspecified injury of thorax, initial encounter: Secondary | ICD-10-CM | POA: Diagnosis not present

## 2020-02-07 DIAGNOSIS — W010XXA Fall on same level from slipping, tripping and stumbling without subsequent striking against object, initial encounter: Secondary | ICD-10-CM | POA: Diagnosis present

## 2020-02-07 DIAGNOSIS — E785 Hyperlipidemia, unspecified: Secondary | ICD-10-CM | POA: Diagnosis present

## 2020-02-07 DIAGNOSIS — T07XXXA Unspecified multiple injuries, initial encounter: Secondary | ICD-10-CM | POA: Diagnosis not present

## 2020-02-07 DIAGNOSIS — M810 Age-related osteoporosis without current pathological fracture: Secondary | ICD-10-CM | POA: Diagnosis not present

## 2020-02-07 DIAGNOSIS — S32591A Other specified fracture of right pubis, initial encounter for closed fracture: Secondary | ICD-10-CM | POA: Diagnosis not present

## 2020-02-07 DIAGNOSIS — I130 Hypertensive heart and chronic kidney disease with heart failure and stage 1 through stage 4 chronic kidney disease, or unspecified chronic kidney disease: Secondary | ICD-10-CM | POA: Diagnosis present

## 2020-02-07 DIAGNOSIS — R6889 Other general symptoms and signs: Secondary | ICD-10-CM | POA: Diagnosis not present

## 2020-02-07 DIAGNOSIS — N179 Acute kidney failure, unspecified: Secondary | ICD-10-CM | POA: Diagnosis present

## 2020-02-07 DIAGNOSIS — S72001D Fracture of unspecified part of neck of right femur, subsequent encounter for closed fracture with routine healing: Secondary | ICD-10-CM | POA: Diagnosis not present

## 2020-02-07 DIAGNOSIS — Z7401 Bed confinement status: Secondary | ICD-10-CM | POA: Diagnosis not present

## 2020-02-07 DIAGNOSIS — E1165 Type 2 diabetes mellitus with hyperglycemia: Secondary | ICD-10-CM | POA: Diagnosis present

## 2020-02-07 DIAGNOSIS — R2681 Unsteadiness on feet: Secondary | ICD-10-CM | POA: Diagnosis not present

## 2020-02-07 DIAGNOSIS — R52 Pain, unspecified: Secondary | ICD-10-CM | POA: Diagnosis not present

## 2020-02-07 DIAGNOSIS — Z09 Encounter for follow-up examination after completed treatment for conditions other than malignant neoplasm: Secondary | ICD-10-CM

## 2020-02-07 DIAGNOSIS — E871 Hypo-osmolality and hyponatremia: Secondary | ICD-10-CM | POA: Diagnosis not present

## 2020-02-07 DIAGNOSIS — D62 Acute posthemorrhagic anemia: Secondary | ICD-10-CM | POA: Diagnosis not present

## 2020-02-07 DIAGNOSIS — E1121 Type 2 diabetes mellitus with diabetic nephropathy: Secondary | ICD-10-CM

## 2020-02-07 DIAGNOSIS — R0989 Other specified symptoms and signs involving the circulatory and respiratory systems: Secondary | ICD-10-CM | POA: Diagnosis not present

## 2020-02-07 DIAGNOSIS — M6281 Muscle weakness (generalized): Secondary | ICD-10-CM | POA: Diagnosis not present

## 2020-02-07 DIAGNOSIS — S72001A Fracture of unspecified part of neck of right femur, initial encounter for closed fracture: Secondary | ICD-10-CM | POA: Diagnosis present

## 2020-02-07 DIAGNOSIS — I10 Essential (primary) hypertension: Secondary | ICD-10-CM | POA: Diagnosis not present

## 2020-02-07 DIAGNOSIS — E1122 Type 2 diabetes mellitus with diabetic chronic kidney disease: Secondary | ICD-10-CM | POA: Diagnosis present

## 2020-02-07 DIAGNOSIS — E1169 Type 2 diabetes mellitus with other specified complication: Secondary | ICD-10-CM | POA: Diagnosis present

## 2020-02-07 DIAGNOSIS — N184 Chronic kidney disease, stage 4 (severe): Secondary | ICD-10-CM | POA: Diagnosis not present

## 2020-02-07 DIAGNOSIS — N1831 Chronic kidney disease, stage 3a: Secondary | ICD-10-CM | POA: Diagnosis not present

## 2020-02-07 DIAGNOSIS — S7291XA Unspecified fracture of right femur, initial encounter for closed fracture: Secondary | ICD-10-CM | POA: Diagnosis not present

## 2020-02-07 DIAGNOSIS — Z9181 History of falling: Secondary | ICD-10-CM

## 2020-02-07 DIAGNOSIS — R262 Difficulty in walking, not elsewhere classified: Secondary | ICD-10-CM | POA: Diagnosis not present

## 2020-02-07 DIAGNOSIS — I5032 Chronic diastolic (congestive) heart failure: Secondary | ICD-10-CM | POA: Diagnosis not present

## 2020-02-07 DIAGNOSIS — Z96651 Presence of right artificial knee joint: Secondary | ICD-10-CM | POA: Diagnosis not present

## 2020-02-07 DIAGNOSIS — M6250 Muscle wasting and atrophy, not elsewhere classified, unspecified site: Secondary | ICD-10-CM | POA: Diagnosis not present

## 2020-02-07 DIAGNOSIS — I517 Cardiomegaly: Secondary | ICD-10-CM | POA: Diagnosis not present

## 2020-02-07 DIAGNOSIS — R278 Other lack of coordination: Secondary | ICD-10-CM | POA: Diagnosis not present

## 2020-02-07 DIAGNOSIS — Z7984 Long term (current) use of oral hypoglycemic drugs: Secondary | ICD-10-CM | POA: Diagnosis not present

## 2020-02-07 DIAGNOSIS — Z20822 Contact with and (suspected) exposure to covid-19: Secondary | ICD-10-CM | POA: Diagnosis present

## 2020-02-07 DIAGNOSIS — F419 Anxiety disorder, unspecified: Secondary | ICD-10-CM | POA: Diagnosis present

## 2020-02-07 DIAGNOSIS — I4891 Unspecified atrial fibrillation: Secondary | ICD-10-CM | POA: Diagnosis not present

## 2020-02-07 DIAGNOSIS — D72828 Other elevated white blood cell count: Secondary | ICD-10-CM | POA: Diagnosis not present

## 2020-02-07 DIAGNOSIS — Z950 Presence of cardiac pacemaker: Secondary | ICD-10-CM | POA: Diagnosis present

## 2020-02-07 DIAGNOSIS — I4819 Other persistent atrial fibrillation: Secondary | ICD-10-CM | POA: Diagnosis present

## 2020-02-07 DIAGNOSIS — E860 Dehydration: Secondary | ICD-10-CM | POA: Diagnosis not present

## 2020-02-07 DIAGNOSIS — Z4889 Encounter for other specified surgical aftercare: Secondary | ICD-10-CM | POA: Diagnosis not present

## 2020-02-07 DIAGNOSIS — Y92009 Unspecified place in unspecified non-institutional (private) residence as the place of occurrence of the external cause: Secondary | ICD-10-CM | POA: Diagnosis not present

## 2020-02-07 DIAGNOSIS — S7221XA Displaced subtrochanteric fracture of right femur, initial encounter for closed fracture: Principal | ICD-10-CM | POA: Diagnosis present

## 2020-02-07 DIAGNOSIS — S72009A Fracture of unspecified part of neck of unspecified femur, initial encounter for closed fracture: Secondary | ICD-10-CM | POA: Diagnosis present

## 2020-02-07 DIAGNOSIS — E039 Hypothyroidism, unspecified: Secondary | ICD-10-CM | POA: Diagnosis present

## 2020-02-07 DIAGNOSIS — Z7982 Long term (current) use of aspirin: Secondary | ICD-10-CM | POA: Diagnosis not present

## 2020-02-07 DIAGNOSIS — R338 Other retention of urine: Secondary | ICD-10-CM | POA: Diagnosis not present

## 2020-02-07 DIAGNOSIS — Z743 Need for continuous supervision: Secondary | ICD-10-CM | POA: Diagnosis not present

## 2020-02-07 DIAGNOSIS — M25551 Pain in right hip: Secondary | ICD-10-CM | POA: Diagnosis present

## 2020-02-07 DIAGNOSIS — Z01810 Encounter for preprocedural cardiovascular examination: Secondary | ICD-10-CM | POA: Diagnosis not present

## 2020-02-07 DIAGNOSIS — W19XXXA Unspecified fall, initial encounter: Secondary | ICD-10-CM | POA: Diagnosis not present

## 2020-02-07 DIAGNOSIS — Z419 Encounter for procedure for purposes other than remedying health state, unspecified: Secondary | ICD-10-CM

## 2020-02-07 DIAGNOSIS — M2550 Pain in unspecified joint: Secondary | ICD-10-CM | POA: Diagnosis not present

## 2020-02-07 DIAGNOSIS — S72141A Displaced intertrochanteric fracture of right femur, initial encounter for closed fracture: Secondary | ICD-10-CM | POA: Diagnosis not present

## 2020-02-07 DIAGNOSIS — S72351A Displaced comminuted fracture of shaft of right femur, initial encounter for closed fracture: Secondary | ICD-10-CM | POA: Diagnosis not present

## 2020-02-07 DIAGNOSIS — S72041A Displaced fracture of base of neck of right femur, initial encounter for closed fracture: Secondary | ICD-10-CM | POA: Diagnosis not present

## 2020-02-07 DIAGNOSIS — Z79899 Other long term (current) drug therapy: Secondary | ICD-10-CM

## 2020-02-07 DIAGNOSIS — J849 Interstitial pulmonary disease, unspecified: Secondary | ICD-10-CM | POA: Diagnosis not present

## 2020-02-07 DIAGNOSIS — R531 Weakness: Secondary | ICD-10-CM | POA: Diagnosis not present

## 2020-02-07 DIAGNOSIS — D631 Anemia in chronic kidney disease: Secondary | ICD-10-CM | POA: Diagnosis present

## 2020-02-07 DIAGNOSIS — S72009D Fracture of unspecified part of neck of unspecified femur, subsequent encounter for closed fracture with routine healing: Secondary | ICD-10-CM | POA: Diagnosis not present

## 2020-02-07 DIAGNOSIS — R2689 Other abnormalities of gait and mobility: Secondary | ICD-10-CM | POA: Diagnosis not present

## 2020-02-07 DIAGNOSIS — E1129 Type 2 diabetes mellitus with other diabetic kidney complication: Secondary | ICD-10-CM | POA: Diagnosis present

## 2020-02-07 LAB — CBC WITH DIFFERENTIAL/PLATELET
Abs Immature Granulocytes: 0.08 10*3/uL — ABNORMAL HIGH (ref 0.00–0.07)
Basophils Absolute: 0 10*3/uL (ref 0.0–0.1)
Basophils Relative: 0 %
Eosinophils Absolute: 0 10*3/uL (ref 0.0–0.5)
Eosinophils Relative: 0 %
HCT: 37 % (ref 36.0–46.0)
Hemoglobin: 11.4 g/dL — ABNORMAL LOW (ref 12.0–15.0)
Immature Granulocytes: 1 %
Lymphocytes Relative: 7 %
Lymphs Abs: 1 10*3/uL (ref 0.7–4.0)
MCH: 28.8 pg (ref 26.0–34.0)
MCHC: 30.8 g/dL (ref 30.0–36.0)
MCV: 93.4 fL (ref 80.0–100.0)
Monocytes Absolute: 0.9 10*3/uL (ref 0.1–1.0)
Monocytes Relative: 7 %
Neutro Abs: 12.2 10*3/uL — ABNORMAL HIGH (ref 1.7–7.7)
Neutrophils Relative %: 85 %
Platelets: 174 10*3/uL (ref 150–400)
RBC: 3.96 MIL/uL (ref 3.87–5.11)
RDW: 12.5 % (ref 11.5–15.5)
WBC: 14.3 10*3/uL — ABNORMAL HIGH (ref 4.0–10.5)
nRBC: 0 % (ref 0.0–0.2)

## 2020-02-07 LAB — PROTIME-INR
INR: 1 (ref 0.8–1.2)
Prothrombin Time: 12.9 seconds (ref 11.4–15.2)

## 2020-02-07 LAB — BASIC METABOLIC PANEL
Anion gap: 11 (ref 5–15)
BUN: 41 mg/dL — ABNORMAL HIGH (ref 8–23)
CO2: 25 mmol/L (ref 22–32)
Calcium: 9.3 mg/dL (ref 8.9–10.3)
Chloride: 101 mmol/L (ref 98–111)
Creatinine, Ser: 1.42 mg/dL — ABNORMAL HIGH (ref 0.44–1.00)
GFR, Estimated: 36 mL/min — ABNORMAL LOW (ref >60)
Glucose, Bld: 289 mg/dL — ABNORMAL HIGH (ref 70–99)
Potassium: 4.9 mmol/L (ref 3.5–5.1)
Sodium: 137 mmol/L (ref 135–145)

## 2020-02-07 MED ORDER — FENTANYL CITRATE (PF) 100 MCG/2ML IJ SOLN
50.0000 ug | INTRAMUSCULAR | Status: DC | PRN
  Administered 2020-02-07: 50 ug via INTRAVENOUS
  Filled 2020-02-07: qty 2

## 2020-02-07 NOTE — ED Triage Notes (Signed)
Pt from home by EMS with husband for a fall. Reported to have tripped and fell onto R side. EMS reported crepitus to R femur and shortening; pt more tender on palpation to R femur. Splint in place.

## 2020-02-07 NOTE — ED Provider Notes (Signed)
[facility] EMERGENCY DEPARTMENT Provider Note   CSN: [id number] Arrival date & time: 02/07/20  2205     History Chief Complaint  Patient presents with  . Fall    Ann Fletcher is a 86 y.o. female.  Patient reports she was walking around and tripped over something, landing on her right hip.  She did not hit her head or lose consciousness.  She denies pain elsewhere but has noted pain to the right hip with inability to ambulate.  The history is provided by the patient.  Fall This is a new problem. The current episode started less than 1 hour ago. The problem occurs constantly. The problem has not changed since onset.Pertinent negatives include no chest pain, no abdominal pain and no shortness of breath. Exacerbated by: Moving her right leg. Nothing relieves the symptoms. She has tried nothing for the symptoms.       Past Medical History:  Diagnosis Date  . Anxiety   . Arthritis   . AV node arrhythmia    a. s/p AV node ablation/PPM 07/2017.  [name] Chronic diastolic CHF (congestive heart failure) ([place])    a. Acute exacerbation occurred in setting of AF.  [name] CKD (chronic kidney disease), stage III ([place])   . Diabetes mellitus without complication ([place])    type 2  . 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 H/O bladder infections   . Hyperlipidemia   . Hypertension   . Hypothyroidism   . Lower extremity edema   . Osteoporosis   . Persistent atrial fibrillation ([place])    a. diagnosed in 10/2014 by PCP, underwent failed TEE DCCV on 10/08/14, loaded with amio, successfully DCCV on 10/15/14.    Patient Active Problem List   Diagnosis Date Noted  . Fracture, proximal femur, right, closed, initial encounter ([place]) 02/07/2020  . Complete heart block ([place]) 12/17/2019  . CRI (chronic renal insufficiency), stage 4 (severe) ([place]) 04/16/2019  . Persistent atrial fibrillation ([place]) 12/02/2018  . Pacemaker 12/02/2018  . 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  Anemia 11/19/2017  . Anorexia 11/19/2017  . Interstitial lung disease ([place]) 11/07/2017  . Iron deficiency anemia   . Acute on chronic diastolic CHF (congestive heart failure) ([place])   . Status post atrioventricular nodal ablation 08/24/2017  . Chronic diastolic CHF (congestive heart failure) ([place]) 08/24/2017  . Type 2 diabetes mellitus with renal manifestations, controlled ([place]) 03/24/2015  . Essential hypertension 02/17/2014  . Hyperlipidemia 02/17/2014  . Lower extremity edema 02/17/2014  . Right carotid bruit 02/17/2014    Past Surgical History:  Procedure Laterality Date  . AV NODE ABLATION N/A 08/23/2017   Procedure: AV NODE ABLATION;  Surgeon: [name], MD;  Location: [place] CV LAB;  Service: Cardiovascular;  Laterality: N/A;  . BACK SURGERY  2001, 2003, 2006   x 3  . BIOPSY  11/06/2017   Procedure: BIOPSY;  Surgeon: [name], MD;  Location: [place];  Service: Endoscopy;;  . CARDIOVERSION N/A 10/08/2014   Procedure: CARDIOVERSION;  Surgeon: [name], MD;  Location: [place];  Service: Cardiovascular;  Laterality: N/A;  . CARDIOVERSION N/A 10/15/2014   Procedure: CARDIOVERSION;  Surgeon: [name], MD;  Location: [facility] ENDOSCOPY;  Service: Cardiovascular;  Laterality: N/A;  . CARDIOVERSION N/A 11/15/2014   Procedure: CARDIOVERSION;  Surgeon: [name], MD;  Location: [place];  Service: Cardiovascular;  Laterality: N/A;  . COLONOSCOPY N/A 11/06/2017   Procedure: COLONOSCOPY;  Surgeon: [name], MD;  Location: [facility] ENDOSCOPY;  Service: Endoscopy;  Laterality: N/A;  . ESOPHAGOGASTRODUODENOSCOPY N/A 11/06/2017   Procedure: ESOPHAGOGASTRODUODENOSCOPY (EGD);  Surgeon: [name], MD;  Location: [facility] ENDOSCOPY;  Service: Endoscopy;  Laterality: N/A;  . PACEMAKER IMPLANT N/A 08/23/2017   Procedure: PACEMAKER IMPLANT;  Surgeon: [name], MD;  Location: [place] CV LAB;  Service: Cardiovascular;  Laterality: N/A;  . POLYPECTOMY  11/06/2017    Procedure: POLYPECTOMY;  Surgeon: [name], MD;  Location: [facility] ENDOSCOPY;  Service: Endoscopy;;  . 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 TEE WITHOUT CARDIOVERSION N/A 10/08/2014   Procedure: TRANSESOPHAGEAL ECHOCARDIOGRAM (TEE);  Surgeon: [name], MD;  Location: [place];  Service: Cardiovascular;  Laterality: N/A;  . TUBAL LIGATION       OB History   No obstetric history on file.     Family History  Problem Relation Age of Onset  . Pneumonia Mother   . Heart attack Father   . Diabetes Sister     Social History   Tobacco Use  . Smoking status: Never Smoker  . Smokeless tobacco: Never Used  Vaping Use  . Vaping Use: Never used  Substance Use Topics  . Alcohol use: No  . Drug use: No    Home Medications Prior to Admission medications   Medication Sig Start Date End Date Taking? Authorizing Provider  acetaminophen (TYLENOL) 500 MG tablet Take 500 mg by mouth every 6 (six) hours as needed (for arthritis). 11/01/19   [provider]  albuterol (PROVENTIL HFA;VENTOLIN HFA) 108 (90 BASE) MCG/ACT inhaler Inhale 2 puffs into the lungs 3 (three) times daily as needed for wheezing or shortness of breath.     [provider]  aspirin 81 MG chewable tablet Chew 81 mg by mouth daily.    [provider]  atorvastatin (LIPITOR) 40 MG tablet Take 40 mg by mouth at bedtime.     [provider]  Calcium Carbonate-Vitamin D (OSCAL 500/200 D-3 PO) Take 1 tablet by mouth daily.     [provider]  cephALEXin (KEFLEX) 500 MG capsule Take 500 mg by mouth 2 (two) times daily. 07/02/19   [provider]  fluticasone (FLONASE) 50 MCG/ACT nasal spray Place 1 spray into both nostrils daily as needed for allergies.  11/02/14   [provider]  furosemide (LASIX) 40 MG tablet Take 40 mg by mouth at bedtime. 07/31/19   [provider]  furosemide (LASIX) 80 MG tablet TAKE ONE WHOLE TABLET (80MG ) IN THE MORNING AND HALF TABLET  (40 MG) IN THE EVENING. 11/17/18   Almyra Deforest, PA  glimepiride (AMARYL) 2 MG tablet Take 2 mg by mouth every morning. 03/30/19   [provider]  Lancet Devices (PRODIGY LANCING DEVICE) MISC See admin instructions. 11/23/19   [provider]  levothyroxine (SYNTHROID, LEVOTHROID) 100 MCG tablet Take 100 mcg by mouth daily before breakfast.  10/28/14   [provider]  LORazepam (ATIVAN) 1 MG tablet Take 1 mg by mouth 3 (three) times daily as needed for anxiety.     [provider]  potassium chloride SA (K-DUR,KLOR-CON) 20 MEQ tablet Take 1 tablet (20 mEq total) by mouth daily. 01/06/18   Almyra Deforest, PA  Prodigy Twist Top Lancets 28G MISC CHECK BLOOD GLUCOSE (SUGAR) TWICE DAILY 11/23/19   [provider]  vitamin E (VITAMIN E) 400 UNIT capsule Take 400 Units by mouth daily.    [provider]    Allergies    Tizanidine hcl, Nsaids, Pradaxa [dabigatran etexilate mesylate],  Sulfa antibiotics, and Betadine [povidone iodine]  Review of Systems   Review of Systems  Constitutional: Negative for chills and fever.  HENT: Negative for ear pain and sore throat.   Eyes: Negative for pain and visual disturbance.  Respiratory: Negative for cough and shortness of breath.   Cardiovascular: Negative for chest pain and palpitations.  Gastrointestinal: Negative for abdominal pain and vomiting.  Genitourinary: Negative for dysuria and hematuria.  Musculoskeletal: Negative for arthralgias and back pain.  Skin: Negative for color change and rash.  Neurological: Negative for seizures and syncope.  All other systems reviewed and are negative.   Physical Exam Updated Vital Signs BP (!) 176/76   Pulse 72   Temp 98.1 F (36.7 C) (Oral)   Resp 16   SpO2 91%   Physical Exam Vitals and nursing note reviewed.  Constitutional:      Appearance: She is well-developed. She is not ill-appearing, toxic-appearing or diaphoretic.  HENT:     Head: Normocephalic and  atraumatic.     Right Ear: External ear normal.     Left Ear: External ear normal.     Nose: Nose normal.     Mouth/Throat:     Mouth: Mucous membranes are moist.     Pharynx: Oropharynx is clear.  Eyes:     Conjunctiva/sclera: Conjunctivae normal.     Pupils: Pupils are equal, round, and reactive to light.  Cardiovascular:     Rate and Rhythm: Normal rate and regular rhythm.     Pulses:          Radial pulses are 2+ on the right side and 2+ on the left side.       Dorsalis pedis pulses are 2+ on the right side and 2+ on the left side.       Posterior tibial pulses are 2+ on the right side and 2+ on the left side.     Heart sounds: No murmur heard.  No gallop.   Pulmonary:     Effort: Pulmonary effort is normal. No respiratory distress.     Breath sounds: Normal breath sounds. No wheezing or rhonchi.  Abdominal:     General: There is no distension.     Palpations: Abdomen is soft.     Tenderness: There is no abdominal tenderness.  Musculoskeletal:     Cervical back: Neck supple.     Comments: BUE with no deformities or tenderness to palpation.  LLE with no deformity or tenderness to palpation.  RUE with deformity to the right hip with external rotation and slight abduction and shortening of the limb, distally neurovascularly intact with brisk capillary refill, intact sensation, ability to wiggle toes, and palpable distal pulses.  Skin:    General: Skin is warm and dry.  Neurological:     General: No focal deficit present.     Mental Status: She is alert.     ED Results / Procedures / Treatments   Labs (all labs ordered are listed, but only abnormal results are displayed) Labs Reviewed  CBC WITH DIFFERENTIAL/PLATELET - Abnormal; Notable for the following components:      Result Value   WBC 14.3 (*)    Hemoglobin 11.4 (*)    Neutro Abs 12.2 (*)    Abs Immature Granulocytes 0.08 (*)    All other components within normal limits  RESPIRATORY PANEL BY RT PCR (FLU A&B, COVID)    BASIC METABOLIC PANEL  PROTIME-INR  TYPE AND SCREEN    EKG None  Radiology  DG Chest 1 View  Result Date: 02/07/2020 CLINICAL DATA:  Tripped and fell, right femoral fracture EXAM: CHEST  1 VIEW COMPARISON:  12/25/2017 FINDINGS: Single frontal view of the chest demonstrates dual lead pacer overlying left chest. Cardiac silhouette remains enlarged. There is increased central vascular congestion. Patchy bilateral airspace disease may reflect edema. No effusion or pneumothorax. No acute bony abnormalities. IMPRESSION: 1. Findings most consistent with mild congestive heart failure. Superimposed infection cannot be excluded. Electronically Signed   By: Randa Ngo M.D.   On: 02/07/2020 23:16   DG Pelvis 1-2 Views  Result Date: 02/07/2020 CLINICAL DATA:  Pain EXAM: PELVIS - 1-2 VIEW COMPARISON:  None. FINDINGS: There is a partially visualized acute displaced fracture of the proximal right femur. There is diffuse osteopenia. There is an old healed fracture of the inferior pubic ramus on the right. IMPRESSION: Partially visualized acute displaced fracture of the proximal right femur. Electronically Signed   By: Constance Holster M.D.   On: 02/07/2020 23:14   DG Femur Min 2 Views Right  Result Date: 02/07/2020 CLINICAL DATA:  Tripped and fell EXAM: RIGHT FEMUR 2 VIEWS COMPARISON:  None. FINDINGS: Frontal and lateral views of the right femur demonstrate a comminuted proximal right femoral fracture, involving the lesser trochanter and proximal femoral diaphysis. There is varus angulation and impaction at the fracture site. No dislocation. Right knee arthroplasty is identified without complication. Extensive atherosclerosis is noted. IMPRESSION: 1. Comminuted proximal right femoral fracture, involving the proximal femoral diaphysis and lesser trochanter. Varus angulation and impaction. Electronically Signed   By: Randa Ngo M.D.   On: 02/07/2020 23:12    Procedures Procedures (including critical  care time)  Medications Ordered in ED Medications  fentaNYL (SUBLIMAZE) injection 50 mcg (50 mcg Intravenous Given 02/07/20 2320)    ED Course  I have reviewed the triage vital signs and the nursing notes.  Pertinent labs & imaging results that were available during my care of the patient were reviewed by me and considered in my medical decision making (see chart for details).    MDM Rules/Calculators/A&P                          Patient is an 86 year old lady, PMH DM, HTN, atrial fibrillation not on anticoagulation due to frequent falls, who presents to the ED via EMS for a fall with right hip pain.  On my initial evaluation, the patient is hemodynamically stable, afebrile, nontoxic-appearing.  Physical exam remarkable for right hip deformity with some shortening, external rotation, slight abduction.  Marked tenderness at the right hip.  Patient provided IV fentanyl for pain.  X-rays obtained, remarkable for right hip fracture.  Consulted orthopedics, who recommended n.p.o. at midnight and requested medicine admission for likely surgery in the morning.  Informed patient of plan for admission, and patient and husband at bedside in agreement with plan.  Consulted hospitalist for admission.  Patient accepted to their service.  No further acute events while under my care.  The care of this patient was overseen by Dr. Alvino Chapel, ED attending, who agreed with evaluation and management of the patient.  Final Clinical Impression(s) / ED Diagnoses Final diagnoses:  Fall    Rx / DC Orders ED Discharge Orders    None       Launa Flight, MD 02/07/20 2351    Davonna Belling, MD 02/11/20 [phone]

## 2020-02-08 ENCOUNTER — Inpatient Hospital Stay (HOSPITAL_COMMUNITY): Payer: Medicare Other

## 2020-02-08 ENCOUNTER — Encounter (HOSPITAL_COMMUNITY): Payer: Self-pay | Admitting: Internal Medicine

## 2020-02-08 ENCOUNTER — Encounter (HOSPITAL_COMMUNITY)
Admission: EM | Disposition: A | Discharge status: Skilled Nursing Facility | Payer: Self-pay | Source: Home / Self Care | Attending: Internal Medicine

## 2020-02-08 ENCOUNTER — Inpatient Hospital Stay (HOSPITAL_COMMUNITY): Payer: Medicare Other | Admitting: Certified Registered Nurse Anesthetist

## 2020-02-08 DIAGNOSIS — I10 Essential (primary) hypertension: Secondary | ICD-10-CM

## 2020-02-08 DIAGNOSIS — E1121 Type 2 diabetes mellitus with diabetic nephropathy: Secondary | ICD-10-CM

## 2020-02-08 DIAGNOSIS — S72009A Fracture of unspecified part of neck of unspecified femur, initial encounter for closed fracture: Secondary | ICD-10-CM | POA: Diagnosis present

## 2020-02-08 DIAGNOSIS — Z01810 Encounter for preprocedural cardiovascular examination: Secondary | ICD-10-CM

## 2020-02-08 DIAGNOSIS — I5032 Chronic diastolic (congestive) heart failure: Secondary | ICD-10-CM | POA: Diagnosis not present

## 2020-02-08 DIAGNOSIS — I4891 Unspecified atrial fibrillation: Secondary | ICD-10-CM

## 2020-02-08 DIAGNOSIS — S72001A Fracture of unspecified part of neck of right femur, initial encounter for closed fracture: Secondary | ICD-10-CM

## 2020-02-08 HISTORY — PX: INTRAMEDULLARY (IM) NAIL INTERTROCHANTERIC: SHX5875

## 2020-02-08 LAB — SURGICAL PCR SCREEN
MRSA, PCR: NEGATIVE
Staphylococcus aureus: NEGATIVE

## 2020-02-08 LAB — CBC
HCT: 31.5 % — ABNORMAL LOW (ref 36.0–46.0)
Hemoglobin: 10 g/dL — ABNORMAL LOW (ref 12.0–15.0)
MCH: 29.1 pg (ref 26.0–34.0)
MCHC: 31.7 g/dL (ref 30.0–36.0)
MCV: 91.6 fL (ref 80.0–100.0)
Platelets: 174 10*3/uL (ref 150–400)
RBC: 3.44 MIL/uL — ABNORMAL LOW (ref 3.87–5.11)
RDW: 12.6 % (ref 11.5–15.5)
WBC: 12.4 10*3/uL — ABNORMAL HIGH (ref 4.0–10.5)
nRBC: 0 % (ref 0.0–0.2)

## 2020-02-08 LAB — CBG MONITORING, ED
Glucose-Capillary: 221 mg/dL — ABNORMAL HIGH (ref 70–99)
Glucose-Capillary: 184 mg/dL — ABNORMAL HIGH (ref 70–99)
Glucose-Capillary: 161 mg/dL — ABNORMAL HIGH (ref 70–99)
Glucose-Capillary: 166 mg/dL — ABNORMAL HIGH (ref 70–99)

## 2020-02-08 LAB — BASIC METABOLIC PANEL
Anion gap: 9 (ref 5–15)
BUN: 37 mg/dL — ABNORMAL HIGH (ref 8–23)
CO2: 26 mmol/L (ref 22–32)
Calcium: 9.1 mg/dL (ref 8.9–10.3)
Chloride: 105 mmol/L (ref 98–111)
Creatinine, Ser: 1.39 mg/dL — ABNORMAL HIGH (ref 0.44–1.00)
GFR, Estimated: 37 mL/min — ABNORMAL LOW (ref >60)
Glucose, Bld: 184 mg/dL — ABNORMAL HIGH (ref 70–99)
Potassium: 4.8 mmol/L (ref 3.5–5.1)
Sodium: 140 mmol/L (ref 135–145)

## 2020-02-08 LAB — TYPE AND SCREEN
ABO/RH(D): AB NEG
Antibody Screen: NEGATIVE

## 2020-02-08 LAB — RESPIRATORY PANEL BY RT PCR (FLU A&B, COVID)
Influenza A by PCR: NEGATIVE
Influenza B by PCR: NEGATIVE
SARS Coronavirus 2 by RT PCR: NEGATIVE

## 2020-02-08 LAB — GLUCOSE, CAPILLARY
Glucose-Capillary: 169 mg/dL — ABNORMAL HIGH (ref 70–99)
Glucose-Capillary: 149 mg/dL — ABNORMAL HIGH (ref 70–99)

## 2020-02-08 SURGERY — FIXATION, FRACTURE, INTERTROCHANTERIC, WITH INTRAMEDULLARY ROD
Anesthesia: General | Laterality: Right

## 2020-02-08 SURGERY — FIXATION, FRACTURE, INTERTROCHANTERIC, WITH INTRAMEDULLARY ROD
Anesthesia: Spinal | Laterality: Right

## 2020-02-08 MED ORDER — VANCOMYCIN HCL 1000 MG IV SOLR
INTRAVENOUS | Status: DC | PRN
  Administered 2020-02-08: 1000 mg via TOPICAL

## 2020-02-08 MED ORDER — LIP MEDEX EX OINT
TOPICAL_OINTMENT | CUTANEOUS | Status: AC
  Filled 2020-02-08: qty 7

## 2020-02-08 MED ORDER — MORPHINE SULFATE (PF) 2 MG/ML IV SOLN: 0.5000 mg | INTRAVENOUS | Status: DC | PRN

## 2020-02-08 MED ORDER — LACTATED RINGERS IV SOLN: INTRAVENOUS | Status: DC | PRN

## 2020-02-08 MED ORDER — LIDOCAINE 2% (20 MG/ML) 5 ML SYRINGE
INTRAMUSCULAR | Status: AC
  Filled 2020-02-08: qty 5

## 2020-02-08 MED ORDER — ACETAMINOPHEN 500 MG PO TABS
1000.0000 mg | ORAL_TABLET | Freq: Once | ORAL | Status: AC
  Administered 2020-02-08: 1000 mg via ORAL
  Filled 2020-02-08: qty 2

## 2020-02-08 MED ORDER — PROPOFOL 10 MG/ML IV BOLUS
INTRAVENOUS | Status: DC | PRN
  Administered 2020-02-08 (×3): 10 mg via INTRAVENOUS

## 2020-02-08 MED ORDER — ONDANSETRON HCL 4 MG/2ML IJ SOLN
4.0000 mg | Freq: Four times a day (QID) | INTRAMUSCULAR | Status: DC | PRN
  Administered 2020-02-09: 4 mg via INTRAVENOUS
  Filled 2020-02-08: qty 2

## 2020-02-08 MED ORDER — MORPHINE SULFATE (PF) 2 MG/ML IV SOLN
0.5000 mg | INTRAVENOUS | Status: DC | PRN
  Administered 2020-02-08 (×2): 0.5 mg via INTRAVENOUS
  Filled 2020-02-08 (×2): qty 1

## 2020-02-08 MED ORDER — CEFAZOLIN SODIUM-DEXTROSE 2-4 GM/100ML-% IV SOLN
2.0000 g | Freq: Four times a day (QID) | INTRAVENOUS | Status: AC
  Administered 2020-02-08 – 2020-02-09 (×2): 2 g via INTRAVENOUS
  Filled 2020-02-08 (×2): qty 100

## 2020-02-08 MED ORDER — ENOXAPARIN SODIUM 30 MG/0.3ML ~~LOC~~ SOLN
30.0000 mg | SUBCUTANEOUS | Status: DC
  Administered 2020-02-09 – 2020-02-11 (×3): 30 mg via SUBCUTANEOUS
  Filled 2020-02-08 (×3): qty 0.3

## 2020-02-08 MED ORDER — HYDROCODONE-ACETAMINOPHEN 5-325 MG PO TABS
1.0000 | ORAL_TABLET | ORAL | Status: DC | PRN
  Administered 2020-02-08 – 2020-02-10 (×5): 1 via ORAL
  Filled 2020-02-08 (×5): qty 1

## 2020-02-08 MED ORDER — KETAMINE HCL 10 MG/ML IJ SOLN
INTRAMUSCULAR | Status: AC
  Filled 2020-02-08: qty 1

## 2020-02-08 MED ORDER — DEXAMETHASONE SODIUM PHOSPHATE 10 MG/ML IJ SOLN
INTRAMUSCULAR | Status: AC
  Filled 2020-02-08: qty 1

## 2020-02-08 MED ORDER — CEFAZOLIN SODIUM-DEXTROSE 2-4 GM/100ML-% IV SOLN
2.0000 g | INTRAVENOUS | Status: AC
  Administered 2020-02-08: 2 g via INTRAVENOUS
  Filled 2020-02-08: qty 100

## 2020-02-08 MED ORDER — ONDANSETRON HCL 4 MG/2ML IJ SOLN: 4.0000 mg | Freq: Once | INTRAMUSCULAR | Status: DC | PRN

## 2020-02-08 MED ORDER — INSULIN ASPART 100 UNIT/ML ~~LOC~~ SOLN
0.0000 [IU] | Freq: Three times a day (TID) | SUBCUTANEOUS | Status: DC
  Administered 2020-02-08: 1 [IU] via SUBCUTANEOUS
  Administered 2020-02-09: 2 [IU] via SUBCUTANEOUS
  Administered 2020-02-09: 5 [IU] via SUBCUTANEOUS
  Administered 2020-02-09: 2 [IU] via SUBCUTANEOUS
  Administered 2020-02-09 – 2020-02-10 (×2): 3 [IU] via SUBCUTANEOUS
  Administered 2020-02-10 – 2020-02-11 (×5): 2 [IU] via SUBCUTANEOUS

## 2020-02-08 MED ORDER — PROPOFOL 10 MG/ML IV BOLUS
INTRAVENOUS | Status: AC
  Filled 2020-02-08: qty 20

## 2020-02-08 MED ORDER — LACTATED RINGERS IV SOLN: INTRAVENOUS | Status: DC

## 2020-02-08 MED ORDER — LEVOTHYROXINE SODIUM 100 MCG PO TABS
100.0000 ug | ORAL_TABLET | Freq: Every day | ORAL | Status: DC
  Administered 2020-02-09 – 2020-02-11 (×3): 100 ug via ORAL
  Filled 2020-02-08 (×3): qty 1

## 2020-02-08 MED ORDER — PHENYLEPHRINE HCL (PRESSORS) 10 MG/ML IV SOLN
INTRAVENOUS | Status: AC
  Filled 2020-02-08: qty 1

## 2020-02-08 MED ORDER — LORAZEPAM 1 MG PO TABS
1.0000 mg | ORAL_TABLET | Freq: Three times a day (TID) | ORAL | Status: DC | PRN
  Filled 2020-02-08: qty 1

## 2020-02-08 MED ORDER — MENTHOL 3 MG MT LOZG: 1.0000 | LOZENGE | OROMUCOSAL | Status: DC | PRN

## 2020-02-08 MED ORDER — BUPIVACAINE IN DEXTROSE 0.75-8.25 % IT SOLN
INTRATHECAL | Status: DC | PRN
  Administered 2020-02-08: 1.2 mL via INTRATHECAL

## 2020-02-08 MED ORDER — PHENYLEPHRINE HCL-NACL 10-0.9 MG/250ML-% IV SOLN
INTRAVENOUS | Status: DC | PRN
  Administered 2020-02-08: 50 ug/min via INTRAVENOUS

## 2020-02-08 MED ORDER — FENTANYL CITRATE (PF) 100 MCG/2ML IJ SOLN: 25.0000 ug | INTRAMUSCULAR | Status: DC | PRN

## 2020-02-08 MED ORDER — ALBUTEROL SULFATE HFA 108 (90 BASE) MCG/ACT IN AERS: 2.0000 | INHALATION_SPRAY | Freq: Three times a day (TID) | RESPIRATORY_TRACT | Status: DC | PRN

## 2020-02-08 MED ORDER — ONDANSETRON HCL 4 MG PO TABS: 4.0000 mg | ORAL_TABLET | Freq: Four times a day (QID) | ORAL | Status: DC | PRN

## 2020-02-08 MED ORDER — ACETAMINOPHEN 10 MG/ML IV SOLN: 1000.0000 mg | Freq: Once | INTRAVENOUS | Status: DC | PRN

## 2020-02-08 MED ORDER — BISACODYL 5 MG PO TBEC: 5.0000 mg | DELAYED_RELEASE_TABLET | Freq: Every day | ORAL | Status: DC | PRN

## 2020-02-08 MED ORDER — VANCOMYCIN HCL 1000 MG IV SOLR
INTRAVENOUS | Status: AC
  Filled 2020-02-08: qty 1000

## 2020-02-08 MED ORDER — PHENOL 1.4 % MT LIQD: 1.0000 | OROMUCOSAL | Status: DC | PRN

## 2020-02-08 MED ORDER — ACETAMINOPHEN 500 MG PO TABS
500.0000 mg | ORAL_TABLET | Freq: Four times a day (QID) | ORAL | Status: AC
  Administered 2020-02-08 – 2020-02-09 (×4): 500 mg via ORAL
  Filled 2020-02-08 (×4): qty 1

## 2020-02-08 MED ORDER — ONDANSETRON HCL 4 MG/2ML IJ SOLN
INTRAMUSCULAR | Status: AC
  Filled 2020-02-08: qty 2

## 2020-02-08 MED ORDER — ATORVASTATIN CALCIUM 40 MG PO TABS
40.0000 mg | ORAL_TABLET | Freq: Every day | ORAL | Status: DC
  Administered 2020-02-08 – 2020-02-10 (×4): 40 mg via ORAL
  Filled 2020-02-08 (×5): qty 1

## 2020-02-08 MED ORDER — HYDROCODONE-ACETAMINOPHEN 7.5-325 MG PO TABS
1.0000 | ORAL_TABLET | ORAL | Status: DC | PRN
  Administered 2020-02-11 (×3): 1 via ORAL
  Filled 2020-02-08 (×4): qty 1

## 2020-02-08 MED ORDER — 0.9 % SODIUM CHLORIDE (POUR BTL) OPTIME: TOPICAL | Status: DC | PRN

## 2020-02-08 MED ORDER — PROPOFOL 500 MG/50ML IV EMUL
INTRAVENOUS | Status: DC | PRN
  Administered 2020-02-08: 35 ug/kg/min via INTRAVENOUS

## 2020-02-08 MED ORDER — FENTANYL CITRATE (PF) 100 MCG/2ML IJ SOLN
INTRAMUSCULAR | Status: DC | PRN
  Administered 2020-02-08 (×2): 25 ug via INTRAVENOUS

## 2020-02-08 MED ORDER — FENTANYL CITRATE (PF) 100 MCG/2ML IJ SOLN
INTRAMUSCULAR | Status: AC
  Filled 2020-02-08: qty 2

## 2020-02-08 MED ORDER — POLYETHYLENE GLYCOL 3350 17 G PO PACK: 17.0000 g | PACK | Freq: Every day | ORAL | Status: DC | PRN

## 2020-02-08 MED ORDER — CEFAZOLIN SODIUM-DEXTROSE 2-4 GM/100ML-% IV SOLN: 2.0000 g | INTRAVENOUS | Status: DC

## 2020-02-08 MED ORDER — DOCUSATE SODIUM 100 MG PO CAPS
100.0000 mg | ORAL_CAPSULE | Freq: Two times a day (BID) | ORAL | Status: DC
  Administered 2020-02-08 – 2020-02-09 (×3): 100 mg via ORAL
  Filled 2020-02-08 (×3): qty 1

## 2020-02-08 MED ORDER — CHLORHEXIDINE GLUCONATE 4 % EX LIQD: 60.0000 mL | Freq: Once | CUTANEOUS | Status: DC

## 2020-02-08 MED ORDER — STERILE WATER FOR IRRIGATION IR SOLN
Status: DC | PRN
  Administered 2020-02-08: 1000 mL

## 2020-02-08 MED ORDER — INSULIN ASPART 100 UNIT/ML ~~LOC~~ SOLN
0.0000 [IU] | SUBCUTANEOUS | Status: DC
  Administered 2020-02-08: 3 [IU] via SUBCUTANEOUS
  Administered 2020-02-08 (×2): 2 [IU] via SUBCUTANEOUS

## 2020-02-08 MED ORDER — KETAMINE HCL 10 MG/ML IJ SOLN
INTRAMUSCULAR | Status: DC | PRN
  Administered 2020-02-08: 30 mg via INTRAVENOUS

## 2020-02-08 SURGICAL SUPPLY — 44 items
ADH SKN CLS APL DERMABOND .7 (GAUZE/BANDAGES/DRESSINGS) ×1
BIT DRILL 4.0X165 AO STYLE (BIT) ×1 IMPLANT
BNDG COHESIVE 4X5 TAN STRL (GAUZE/BANDAGES/DRESSINGS) ×1 IMPLANT
BNDG GAUZE ELAST 4 BULKY (GAUZE/BANDAGES/DRESSINGS) ×1 IMPLANT
CLSR STERI-STRIP ANTIMIC 1/2X4 (GAUZE/BANDAGES/DRESSINGS) ×1 IMPLANT
COVER PERINEAL POST (MISCELLANEOUS) ×1 IMPLANT
COVER SURGICAL LIGHT HANDLE (MISCELLANEOUS) ×1 IMPLANT
COVER WAND RF STERILE (DRAPES) ×1 IMPLANT
DERMABOND ADVANCED (GAUZE/BANDAGES/DRESSINGS) ×1
DERMABOND ADVANCED .7 DNX12 (GAUZE/BANDAGES/DRESSINGS) IMPLANT
DRAPE STERI IOBAN 125X83 (DRAPES) ×1 IMPLANT
DRSG AQUACEL AG ADV 3.5X 4 (GAUZE/BANDAGES/DRESSINGS) ×3 IMPLANT
DRSG AQUACEL AG ADV 3.5X 6 (GAUZE/BANDAGES/DRESSINGS) ×1 IMPLANT
DURAPREP 26ML APPLICATOR (WOUND CARE) ×1 IMPLANT
ELECT REM PT RETURN 15FT ADLT (MISCELLANEOUS) ×1 IMPLANT
GLOVE BIO SURGEON STRL SZ 6.5 (GLOVE) ×1 IMPLANT
GLOVE BIOGEL PI IND STRL 7.0 (GLOVE) ×1 IMPLANT
GLOVE BIOGEL PI IND STRL 8 (GLOVE) ×1 IMPLANT
GLOVE BIOGEL PI INDICATOR 7.0 (GLOVE)
GLOVE BIOGEL PI INDICATOR 8 (GLOVE)
GLOVE ECLIPSE 8.0 STRL XLNG CF (GLOVE) ×2 IMPLANT
GOWN STRL REUS W/ TWL LRG LVL3 (GOWN DISPOSABLE) ×2 IMPLANT
GOWN STRL REUS W/TWL 2XL LVL3 (GOWN DISPOSABLE) IMPLANT
GOWN STRL REUS W/TWL LRG LVL3 (GOWN DISPOSABLE)
GUIDE PIN 3.2X330 (PIN) ×2
GUIDEWIRE BALL NOSE 3.0X900 (WIRE) ×1 IMPLANT
KIT TURNOVER KIT A (KITS) ×1 IMPLANT
MANIFOLD NEPTUNE II (INSTRUMENTS) ×1 IMPLANT
NAIL TROCH RT 10X33 130D (Miscellaneous) ×1 IMPLANT
NS IRRIG 1000ML POUR BTL (IV SOLUTION) ×1 IMPLANT
PACK GENERAL/GYN (CUSTOM PROCEDURE TRAY) ×1 IMPLANT
PAD ARMBOARD 7.5X6 YLW CONV (MISCELLANEOUS) ×1 IMPLANT
PIN GUIDE 3.2X330 (PIN) IMPLANT
SCREW LAG GALILEO FEM 10.5X100 (Screw) ×1 IMPLANT
SCREW LOCK CORT 5.0X38 (Screw) ×1 IMPLANT
SCREW LOCK CORT 5.0X40 (Screw) ×1 IMPLANT
STRIP CLOSURE SKIN 1/2X4 (GAUZE/BANDAGES/DRESSINGS) ×1 IMPLANT
SUT MNCRL AB 4-0 PS2 18 (SUTURE) ×1 IMPLANT
SUT VIC AB 0 CT1 27 (SUTURE)
SUT VIC AB 0 CT1 27XBRD ANBCTR (SUTURE) ×1 IMPLANT
SUT VIC AB 2-0 CT1 27 (SUTURE)
SUT VIC AB 2-0 CT1 TAPERPNT 27 (SUTURE) ×1 IMPLANT
TOOL ACTIVATION (INSTRUMENTS) ×1 IMPLANT
TOWEL OR 17X26 10 PK STRL BLUE (TOWEL DISPOSABLE) ×1 IMPLANT

## 2020-02-08 NOTE — ED Notes (Signed)
hospitalist at bedside

## 2020-02-08 NOTE — Interval H&P Note (Signed)
History and Physical Interval Note:  02/08/2020 3:03 PM  Ann Fletcher  has presented today for surgery, with the diagnosis of right hip fracture.  The various methods of treatment have been discussed with the patient and family. After consideration of risks, benefits and other options for treatment, the patient has consented to  Procedure(s): INTRAMEDULLARY (IM) NAIL INTERTROCHANTRIC (Right) as a surgical intervention.  The patient's history has been reviewed, patient examined, no change in status, stable for surgery.  I have reviewed the patient's chart and labs.  Questions were answered to the patient's satisfaction.     Hiram Gash

## 2020-02-08 NOTE — Anesthesia Preprocedure Evaluation (Signed)
Anesthesia Evaluation  Patient identified by MRN, date of birth, ID band Patient awake    Reviewed: Allergy & Precautions, NPO status , Patient's Chart, lab work & pertinent test results  Airway Mallampati: II       Dental  (+) Edentulous Upper, Edentulous Lower   Pulmonary neg pulmonary ROS,    Pulmonary exam normal        Cardiovascular hypertension, +CHF  + dysrhythmias Atrial Fibrillation + pacemaker  Rhythm:Irregular Rate:Normal  VVIR   Neuro/Psych Anxiety negative neurological ROS     GI/Hepatic negative GI ROS, Neg liver ROS,   Endo/Other  diabetes, Type 2, Oral Hypoglycemic AgentsHypothyroidism   Renal/GU Renal InsufficiencyRenal diseaseCKD III     Musculoskeletal   Abdominal Normal abdominal exam  (+)   Peds  Hematology  (+) anemia ,   Anesthesia Other Findings  12/08/2019 10:30 PM EDT   Remote device check reviewed. Histograms appropriate. Leads and battery stable for patient. Follow up as outlined above. No recommended changes.     Reproductive/Obstetrics                             Anesthesia Physical Anesthesia Plan  ASA: III  Anesthesia Plan: Spinal   Post-op Pain Management:    Induction:   PONV Risk Score and Plan: Ondansetron  Airway Management Planned: Natural Airway and Simple Face Mask  Additional Equipment: None  Intra-op Plan:   Post-operative Plan:   Informed Consent: I have reviewed the patients History and Physical, chart, labs and discussed the procedure including the risks, benefits and alternatives for the proposed anesthesia with the patient or authorized representative who has indicated his/her understanding and acceptance.       Plan Discussed with: CRNA  Anesthesia Plan Comments:         Anesthesia Quick Evaluation

## 2020-02-08 NOTE — ED Notes (Signed)
Cardiology at bedside.

## 2020-02-08 NOTE — Anesthesia Procedure Notes (Signed)
Spinal  Patient location during procedure: OR Start time: 02/08/2020 4:12 PM End time: 02/08/2020 4:14 PM Staffing Performed: anesthesiologist  Anesthesiologist: Lyn Hollingshead, MD Preanesthetic Checklist Completed: patient identified, IV checked, site marked, risks and benefits discussed, surgical consent, monitors and equipment checked, pre-op evaluation and timeout performed Spinal Block Patient position: right lateral decubitus Prep: DuraPrep and site prepped and draped Patient monitoring: continuous pulse ox and blood pressure Approach: midline Location: L3-4 Injection technique: single-shot Needle Needle type: Pencan  Needle gauge: 24 G Needle length: 10 cm Needle insertion depth: 5 cm Assessment Sensory level: T10

## 2020-02-08 NOTE — ED Notes (Signed)
Ortho at bedside.

## 2020-02-08 NOTE — Progress Notes (Signed)
Patient seen and examined personally, I reviewed the chart, history and physical and admission note, done by admitting physician this morning and agree with the same with following addendum.  Please refer to the morning admission note for more detailed plan of care.  Briefly,  86 year old female with history of A. fib not on anticoagulation due to history of GI bleed, AV nodal ablation status with pacemaker in place, chronic diastolic CHF, hypertension, T2DM, CKD stage III, hypothyroidism admitted with a right proximal femur fracture secondary to fall at home.  Seen this morning, Appears anxious, having pain at the right hip. Rate controlled  Proximal right femur fracture secondary to mechanical fall. T2DM with uncontrolled hyperglycemia Chronic diastolic CHF on p.o. Lasix at home currently on hold.  Resume soon.  Euvolemic. A. fib rate controlled, not on anticoagulation due to history of GI bleed.  On aspirin on hold preop. Hypothyroidism on Synthroid Leukocytosis, downtrending wbc. CKDIIIb-creat at baseline HLD on Lipitor  Plan:Orthopedic on board and going for [employer] long today, cardiology has been consulted for preoperative evaluation.

## 2020-02-08 NOTE — ED Notes (Signed)
Son, Cattie Tineo [phone] called and was updated.

## 2020-02-08 NOTE — Op Note (Addendum)
Orthopaedic Surgery Operative Note (CSN: [id number])  Ann Fletcher  04/04/1932 Date of Surgery: 02/08/2020   Diagnoses:  Right 4 part intertrochanteric/subtrochanteric femur fracture  Procedure: 27245 Cephalomedullary nail right femur   Operative Finding Successful completion of the planned procedure.  Bone quality was poor.  I was between sizes of a 33 or 36 cm nail and in the setting of her total knee felt that I may have issues with long.  This allowed the nail to end a cm shorter than I typically would have liked leaving a small area uninstrumented.  I do not think this will be an issues but is of note.    Post-operative plan: The patient will be WBAT.  The patient will be readmitted to the hospitalist.  DVT prophylaxis Lovenox 40 mg/day until mobilizing and then consider transition in clinic to alternative medicines.   Pain control with PRN pain medication preferring oral medicines.  Follow up plan will be scheduled in approximately14 days for incision check and XR.  Post-Op Diagnosis: Same Surgeons:Primary: Hiram Gash, MD Assistants:Caroline McBane PA-C Location: [facility] ROOM 06 Anesthesia: Spinal Antibiotics: Ancef 2 g with local vancomycin powder 1 g at the surgical site Tourniquet time: None Estimated Blood Loss: 50 Complications: None Specimens: None Implants: Implant Name Type Inv. Item Serial No. Manufacturer Lot No. LRB No. Used Action  SCREW LAG GALILEO FEM 10.5X100 - [id number] Screw SCREW LAG GALILEO FEM 10.5X100  [employer]  Right 1 Implanted  arthrex 10x 33 cm     [employer]  Right 1 Implanted  SCREW LOCK CORT 5.0X40 - [id number] Screw SCREW LOCK CORT 5.0X40  [employer]  Right 1 Implanted  SCREW LOCK CORT 5.0X38 - [id number] Screw SCREW LOCK CORT 5.0X38  [employer]  Right 1 Implanted    Indications for Surgery:   Ann Fletcher is a 86 y.o. female with fall resulting in a complex proximal femur fracture.  Benefits and risks of operative and nonoperative management  were discussed prior to surgery with patient/guardian(s) and informed consent form was completed.  Specific risks including infection, need for additional surgery, periprosthetic fracture, non-union, mal-union, amongst others.   Procedure:   The patient was identified properly. Informed consent was obtained and the surgical site was marked. The patient was taken up to suite where general anesthesia was induced.  The patient was positioned supine on hana table.  The right hip was prepped and draped in the usual sterile fashion.  Timeout was performed before the beginning of the case.  The patient was placed supine on a fracture table and appropriate reduction was obtained and visualized on fluoroscopy prior to the beginning of the procedure.  We made an incision proximal to the greater trochanter and dissected down through the fascia.  We then carefully placed our starting pin localizing under fluoroscopy in the appropriate position on the tip of the greater trochanter.  We then advanced it into the proximal femur in typical fashion.  Entry reamer was used but the nail was unreamed.  At this point we placed our nail localizing under fluoroscopy that it was at the appropriate level prior to using the outrigger device to pass a wire and then the cephalo-medullary screw.  We were slightly between sizes and selected shorter to avoid impacting the distal TKA. We placed a derotational wire and screw to avoid spinning the neck fragment.  The screw was locked proximally to avoid over collapse.  We took final shots at the proximal femur and then  used perfect circle technique to place 2 distal interlocks.    We irrigated the wound copiously before placing local antibiotic as listed above.  We closed the incision in a multilayer fashion with absorbable suture.  Sterile dressing was placed.  Patient was awoken taken to PACU in stable condition.  Noemi Chapel, PA-C, present and scrubbed throughout the case,  critical for completion in a timely fashion, and for retraction, instrumentation, closure.

## 2020-02-08 NOTE — Consult Note (Signed)
ORTHOPAEDIC CONSULTATION  REQUESTING PHYSICIAN: Antonieta Pert, MD  Chief Complaint: right hip pain  HPI: Ann Fletcher is a 86 y.o. female with history of A fib not on anticoagulation due to GI bleed, diastolic CHF, HTN, DM, CKD, hypothyroidism, nodal ablation s/p pacemaker who complains of right hip pain after a fall. She was at home walking with her walker when she fell directly onto her right hip. Denies hitting her head or loss of consciousness. States pain is moderate this morning, worse with movement and better with rest. Denies pain at any other joints, denies paraesthesias. Denies chest pain, nausea, vomiting, or abdominal pain.   Past Medical History:  Diagnosis Date  . Anxiety   . Arthritis   . AV node arrhythmia    a. s/p AV node ablation/PPM 07/2017.  [name] Chronic diastolic CHF (congestive heart failure) ([place])    a. Acute exacerbation occurred in setting of AF.  [name] CKD (chronic kidney disease), stage III ([place])   . Diabetes mellitus without complication ([place])    type 2  . 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 H/O bladder infections   . Hyperlipidemia   . Hypertension   . Hypothyroidism   . Lower extremity edema   . Osteoporosis   . Persistent atrial fibrillation ([place])    a. diagnosed in 10/2014 by PCP, underwent failed TEE DCCV on 10/08/14, loaded with amio, successfully DCCV on 10/15/14.   Past Surgical History:  Procedure Laterality Date  . AV NODE ABLATION N/A 08/23/2017   Procedure: AV NODE ABLATION;  Surgeon: [name], MD;  Location: [place] [facility];  Service: Cardiovascular;  Laterality: N/A;  . BACK SURGERY  2001, 2003, 2006   x 3  . BIOPSY  11/06/2017   Procedure: BIOPSY;  Surgeon: [name], MD;  Location: [place];  Service: Endoscopy;;  . CARDIOVERSION N/A 10/08/2014   Procedure: CARDIOVERSION;  Surgeon: [name], MD;  Location: [place];  Service: Cardiovascular;  Laterality: N/A;  . CARDIOVERSION N/A 10/15/2014   Procedure:  CARDIOVERSION;  Surgeon: [name], MD;  Location: [facility] ENDOSCOPY;  Service: Cardiovascular;  Laterality: N/A;  . CARDIOVERSION N/A 11/15/2014   Procedure: CARDIOVERSION;  Surgeon: [name], MD;  Location: [place];  Service: Cardiovascular;  Laterality: N/A;  . COLONOSCOPY N/A 11/06/2017   Procedure: COLONOSCOPY;  Surgeon: [name], MD;  Location: [facility] ENDOSCOPY;  Service: Endoscopy;  Laterality: N/A;  . ESOPHAGOGASTRODUODENOSCOPY N/A 11/06/2017   Procedure: ESOPHAGOGASTRODUODENOSCOPY (EGD);  Surgeon: [name], MD;  Location: [facility] ENDOSCOPY;  Service: Endoscopy;  Laterality: N/A;  . PACEMAKER IMPLANT N/A 08/23/2017   Procedure: PACEMAKER IMPLANT;  Surgeon: [name], MD;  Location: [place] [facility];  Service: Cardiovascular;  Laterality: N/A;  . POLYPECTOMY  11/06/2017   Procedure: POLYPECTOMY;  Surgeon: [name], MD;  Location: [facility] ENDOSCOPY;  Service: Endoscopy;;  . 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 TEE WITHOUT CARDIOVERSION N/A 10/08/2014   Procedure: TRANSESOPHAGEAL ECHOCARDIOGRAM (TEE);  Surgeon: [name], MD;  Location: [place];  Service: Cardiovascular;  Laterality: N/A;  . TUBAL LIGATION     Social History   Socioeconomic History  . Marital status: Married    Spouse name: Not on file  . Number of children: Not on file  . Years of education: Not on file  . Highest education level: Not on file  Occupational History  . Not on file  Tobacco Use  . Smoking status: Never Smoker  . Smokeless tobacco: Never Used  Vaping Use  . Vaping Use: Never used  Substance and Sexual Activity  . Alcohol use: No  . Drug use: No  . Sexual activity: Yes    Birth control/protection: Surgical  Other Topics Concern  . Not on file  Social History Narrative  . Not on file   Social Determinants of Health   Financial Resource Strain:   . Difficulty of Paying Living Expenses: Not on file  Food Insecurity:   . Worried About Charity fundraiser in the Last  Year: Not on file  . Ran Out of Food in the Last Year: Not on file  Transportation Needs:   . Lack of Transportation (Medical): Not on file  . Lack of Transportation (Non-Medical): Not on file  Physical Activity:   . Days of Exercise per Week: Not on file  . Minutes of Exercise per Session: Not on file  Stress:   . Feeling of Stress : Not on file  Social Connections:   . Frequency of Communication with Friends and Family: Not on file  . Frequency of Social Gatherings with Friends and Family: Not on file  . Attends Religious Services: Not on file  . Active Member of Clubs or Organizations: Not on file  . Attends Archivist Meetings: Not on file  . Marital Status: Not on file   Family History  Problem Relation Age of Onset  . Pneumonia Mother   . Heart attack Father   . Diabetes Sister    Allergies  Allergen Reactions  . Tizanidine Hcl Other (See Comments)    Dizziness, patient reports fall  . Nsaids Itching and Other (See Comments)    Stomach cramps   . Pradaxa [Dabigatran Etexilate Mesylate] Nausea And Vomiting  . Sulfa Antibiotics Nausea And Vomiting  . Betadine [Povidone Iodine] Itching and Rash     Positive ROS: All other systems have been reviewed and were otherwise negative with the exception of those mentioned in the HPI and as above.  Physical Exam: General: Alert, laying in bed, in no acute distress Cardiovascular: No pedal edema. Respiratory: No cyanosis, no use of accessory musculature. GI: No organomegaly, abdomen is soft and non-tender Skin: No lesions noted over left hip. Neurologic: Sensation intact distally Psychiatric: Patient is competent for consent with normal mood and affect Lymphatic: No axillary or cervical lymphadenopathy  MUSCULOSKELETAL: RLE shortened, in splint. No TTP over lateral hip or groin. No TTP over knee. Dorsiflexion and Plantarflexion intact. +DP pulse. Distal sensation intact.   Assessment/Plan: Right proximal femur  fracture - Risks, benefits, and alternatives of surgical fixation of femur fracture were reviewed with the patient, she would like to move forward with operative management  - plan for IM nail with Dr. Griffin Basil this afternoon, please keep NPO  Ventura Bruns, PA-C    02/08/2020 7:47 AM

## 2020-02-08 NOTE — H&P (View-Only) (Signed)
ORTHOPAEDIC CONSULTATION  REQUESTING PHYSICIAN: Antonieta Pert, MD  Chief Complaint: right hip pain  HPI: Ann Fletcher is a 86 y.o. female with history of A fib not on anticoagulation due to GI bleed, diastolic CHF, HTN, DM, CKD, hypothyroidism, nodal ablation s/p pacemaker who complains of right hip pain after a fall. She was at home walking with her walker when she fell directly onto her right hip. Denies hitting her head or loss of consciousness. States pain is moderate this morning, worse with movement and better with rest. Denies pain at any other joints, denies paraesthesias. Denies chest pain, nausea, vomiting, or abdominal pain.   Past Medical History:  Diagnosis Date  . Anxiety   . Arthritis   . AV node arrhythmia    a. s/p AV node ablation/PPM 07/2017.  [name] Chronic diastolic CHF (congestive heart failure) ([place])    a. Acute exacerbation occurred in setting of AF.  [name] CKD (chronic kidney disease), stage III ([place])   . Diabetes mellitus without complication ([place])    type 2  . 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 H/O bladder infections   . Hyperlipidemia   . Hypertension   . Hypothyroidism   . Lower extremity edema   . Osteoporosis   . Persistent atrial fibrillation ([place])    a. diagnosed in 10/2014 by PCP, underwent failed TEE DCCV on 10/08/14, loaded with amio, successfully DCCV on 10/15/14.   Past Surgical History:  Procedure Laterality Date  . AV NODE ABLATION N/A 08/23/2017   Procedure: AV NODE ABLATION;  Surgeon: [name], MD;  Location: [place] [facility];  Service: Cardiovascular;  Laterality: N/A;  . BACK SURGERY  2001, 2003, 2006   x 3  . BIOPSY  11/06/2017   Procedure: BIOPSY;  Surgeon: [name], MD;  Location: [place];  Service: Endoscopy;;  . CARDIOVERSION N/A 10/08/2014   Procedure: CARDIOVERSION;  Surgeon: [name], MD;  Location: [place];  Service: Cardiovascular;  Laterality: N/A;  . CARDIOVERSION N/A 10/15/2014   Procedure:  CARDIOVERSION;  Surgeon: [name], MD;  Location: [facility] ENDOSCOPY;  Service: Cardiovascular;  Laterality: N/A;  . CARDIOVERSION N/A 11/15/2014   Procedure: CARDIOVERSION;  Surgeon: [name], MD;  Location: [place];  Service: Cardiovascular;  Laterality: N/A;  . COLONOSCOPY N/A 11/06/2017   Procedure: COLONOSCOPY;  Surgeon: [name], MD;  Location: [facility] ENDOSCOPY;  Service: Endoscopy;  Laterality: N/A;  . ESOPHAGOGASTRODUODENOSCOPY N/A 11/06/2017   Procedure: ESOPHAGOGASTRODUODENOSCOPY (EGD);  Surgeon: [name], MD;  Location: [facility] ENDOSCOPY;  Service: Endoscopy;  Laterality: N/A;  . PACEMAKER IMPLANT N/A 08/23/2017   Procedure: PACEMAKER IMPLANT;  Surgeon: [name], MD;  Location: [place] [facility];  Service: Cardiovascular;  Laterality: N/A;  . POLYPECTOMY  11/06/2017   Procedure: POLYPECTOMY;  Surgeon: [name], MD;  Location: [facility] ENDOSCOPY;  Service: Endoscopy;;  . 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 TEE WITHOUT CARDIOVERSION N/A 10/08/2014   Procedure: TRANSESOPHAGEAL ECHOCARDIOGRAM (TEE);  Surgeon: [name], MD;  Location: [place];  Service: Cardiovascular;  Laterality: N/A;  . TUBAL LIGATION     Social History   Socioeconomic History  . Marital status: Married    Spouse name: Not on file  . Number of children: Not on file  . Years of education: Not on file  . Highest education level: Not on file  Occupational History  . Not on file  Tobacco Use  . Smoking status: Never Smoker  . Smokeless tobacco: Never Used  Vaping Use  . Vaping Use: Never used  Substance and Sexual Activity  . Alcohol use: No  . Drug use: No  . Sexual activity: Yes    Birth control/protection: Surgical  Other Topics Concern  . Not on file  Social History Narrative  . Not on file   Social Determinants of Health   Financial Resource Strain:   . Difficulty of Paying Living Expenses: Not on file  Food Insecurity:   . Worried About Charity fundraiser in the Last  Year: Not on file  . Ran Out of Food in the Last Year: Not on file  Transportation Needs:   . Lack of Transportation (Medical): Not on file  . Lack of Transportation (Non-Medical): Not on file  Physical Activity:   . Days of Exercise per Week: Not on file  . Minutes of Exercise per Session: Not on file  Stress:   . Feeling of Stress : Not on file  Social Connections:   . Frequency of Communication with Friends and Family: Not on file  . Frequency of Social Gatherings with Friends and Family: Not on file  . Attends Religious Services: Not on file  . Active Member of Clubs or Organizations: Not on file  . Attends Archivist Meetings: Not on file  . Marital Status: Not on file   Family History  Problem Relation Age of Onset  . Pneumonia Mother   . Heart attack Father   . Diabetes Sister    Allergies  Allergen Reactions  . Tizanidine Hcl Other (See Comments)    Dizziness, patient reports fall  . Nsaids Itching and Other (See Comments)    Stomach cramps   . Pradaxa [Dabigatran Etexilate Mesylate] Nausea And Vomiting  . Sulfa Antibiotics Nausea And Vomiting  . Betadine [Povidone Iodine] Itching and Rash     Positive ROS: All other systems have been reviewed and were otherwise negative with the exception of those mentioned in the HPI and as above.  Physical Exam: General: Alert, laying in bed, in no acute distress Cardiovascular: No pedal edema. Respiratory: No cyanosis, no use of accessory musculature. GI: No organomegaly, abdomen is soft and non-tender Skin: No lesions noted over left hip. Neurologic: Sensation intact distally Psychiatric: Patient is competent for consent with normal mood and affect Lymphatic: No axillary or cervical lymphadenopathy  MUSCULOSKELETAL: RLE shortened, in splint. No TTP over lateral hip or groin. No TTP over knee. Dorsiflexion and Plantarflexion intact. +DP pulse. Distal sensation intact.   Assessment/Plan: Right proximal femur  fracture - Risks, benefits, and alternatives of surgical fixation of femur fracture were reviewed with the patient, she would like to move forward with operative management  - plan for IM nail with Dr. Griffin Basil this afternoon, please keep NPO  Ventura Bruns, PA-C    02/08/2020 7:47 AM

## 2020-02-08 NOTE — H&P (Signed)
History and Physical    Ann Fletcher GMW:[id number] DOB: 1932-04-06 DOA: 02/07/2020  PCP: [name], MD  Patient coming from: Home.  Chief Complaint: Fall.  HPI: Ann Fletcher is a 86 y.o. female with history of A. fib not on anticoagulation secondary to history of GI bleed history of AV nodal ablation status post pacemaker placement, diastolic CHF hypertension diabetes mellitus chronic kidney disease stage III hypothyroidism had a fall at home when she slipped and fell.  Denies hitting her head or losing consciousness but denies chest pain or shortness of breath.  Since the fall patient has been having right hip area pain and was brought to the ER.  ED Course: X-rays revealed right proximal femur fracture Dr. Mardelle Matte on-call orthopedic surgeon has been consulted.  Labs are remarkable for mild leukocytosis Covid test was negative.  Patient admitted for further management.  On exam patient is not short of breath or denies any chest pain.  Review of Systems: As per HPI, rest all negative.   Past Medical History:  Diagnosis Date  . Anxiety   . Arthritis   . AV node arrhythmia    a. s/p AV node ablation/PPM 07/2017.  [name] Chronic diastolic CHF (congestive heart failure) ([place])    a. Acute exacerbation occurred in setting of AF.  [name] CKD (chronic kidney disease), stage III ([place])   . Diabetes mellitus without complication ([place])    type 2  . 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 H/O bladder infections   . Hyperlipidemia   . Hypertension   . Hypothyroidism   . Lower extremity edema   . Osteoporosis   . Persistent atrial fibrillation ([place])    a. diagnosed in 10/2014 by PCP, underwent failed TEE DCCV on 10/08/14, loaded with amio, successfully DCCV on 10/15/14.    Past Surgical History:  Procedure Laterality Date  . AV NODE ABLATION N/A 08/23/2017   Procedure: AV NODE ABLATION;  Surgeon: [name], MD;  Location: [facility];  Service: Cardiovascular;  Laterality:  N/A;  . BACK SURGERY  2001, 2003, 2006   x 3  . BIOPSY  11/06/2017   Procedure: BIOPSY;  Surgeon: [name], MD;  Location: [facility];  Service: Endoscopy;;  . CARDIOVERSION N/A 10/08/2014   Procedure: CARDIOVERSION;  Surgeon: [name], MD;  Location: [facility];  Service: Cardiovascular;  Laterality: N/A;  . CARDIOVERSION N/A 10/15/2014   Procedure: CARDIOVERSION;  Surgeon: [name], MD;  Location: [facility] ENDOSCOPY;  Service: Cardiovascular;  Laterality: N/A;  . CARDIOVERSION N/A 11/15/2014   Procedure: CARDIOVERSION;  Surgeon: [name], MD;  Location: [place];  Service: Cardiovascular;  Laterality: N/A;  . COLONOSCOPY N/A 11/06/2017   Procedure: COLONOSCOPY;  Surgeon: [name], MD;  Location: [facility] ENDOSCOPY;  Service: Endoscopy;  Laterality: N/A;  . ESOPHAGOGASTRODUODENOSCOPY N/A 11/06/2017   Procedure: ESOPHAGOGASTRODUODENOSCOPY (EGD);  Surgeon: [name], MD;  Location: [facility] ENDOSCOPY;  Service: Endoscopy;  Laterality: N/A;  . PACEMAKER IMPLANT N/A 08/23/2017   Procedure: PACEMAKER IMPLANT;  Surgeon: [name], MD;  Location: [place] [facility];  Service: Cardiovascular;  Laterality: N/A;  . POLYPECTOMY  11/06/2017   Procedure: POLYPECTOMY;  Surgeon: [name], MD;  Location: [facility];  Service: Endoscopy;;  . 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 TEE WITHOUT CARDIOVERSION N/A 10/08/2014   Procedure: TRANSESOPHAGEAL ECHOCARDIOGRAM (TEE);  Surgeon: [name], MD;  Location: [place];  Service: Cardiovascular;  Laterality: N/A;  . TUBAL LIGATION  reports that she has never smoked. She has never used smokeless tobacco. She reports that she does not drink alcohol and does not use drugs.  Allergies  Allergen Reactions  . Tizanidine Hcl Other (See Comments)    Dizziness, patient reports fall  . Nsaids Itching and Other (See Comments)    Stomach cramps   . Pradaxa [Dabigatran Etexilate Mesylate] Nausea And Vomiting  . Sulfa Antibiotics  Nausea And Vomiting  . Betadine [Povidone Iodine] Itching and Rash    Family History  Problem Relation Age of Onset  . Pneumonia Mother   . Heart attack Father   . Diabetes Sister     Prior to Admission medications   Medication Sig Start Date End Date Taking? Authorizing Provider  acetaminophen (TYLENOL) 500 MG tablet Take 500 mg by mouth every 6 (six) hours as needed (for arthritis). 11/01/19  Yes [provider]  furosemide (LASIX) 80 MG tablet TAKE ONE WHOLE TABLET (80MG ) IN THE MORNING AND HALF TABLET (40 MG) IN THE EVENING. 11/17/18  Yes Meng, Hao, PA  LORazepam (ATIVAN) 1 MG tablet Take 1 mg by mouth 3 (three) times daily as needed for anxiety.    Yes [provider]  albuterol (PROVENTIL HFA;VENTOLIN HFA) 108 (90 BASE) MCG/ACT inhaler Inhale 2 puffs into the lungs 3 (three) times daily as needed for wheezing or shortness of breath.     [provider]  aspirin 81 MG chewable tablet Chew 81 mg by mouth daily.    [provider]  atorvastatin (LIPITOR) 40 MG tablet Take 40 mg by mouth at bedtime.     [provider]  Calcium Carbonate-Vitamin D (OSCAL 500/200 D-3 PO) Take 1 tablet by mouth daily.     [provider]  cephALEXin (KEFLEX) 500 MG capsule Take 500 mg by mouth 2 (two) times daily. 07/02/19   [provider]  fluticasone (FLONASE) 50 MCG/ACT nasal spray Place 1 spray into both nostrils daily as needed for allergies.  11/02/14   [provider]  glimepiride (AMARYL) 2 MG tablet Take 2 mg by mouth every morning. 03/30/19   [provider]  Lancet Devices (PRODIGY LANCING DEVICE) MISC See admin instructions. 11/23/19   [provider]  levothyroxine (SYNTHROID, LEVOTHROID) 100 MCG tablet Take 100 mcg by mouth daily before breakfast.  10/28/14   [provider]  potassium chloride SA (K-DUR,KLOR-CON) 20 MEQ tablet Take 1 tablet (20 mEq total) by mouth daily. 01/06/18   Almyra Deforest, PA  Prodigy  Twist Top Lancets 28G MISC CHECK BLOOD GLUCOSE (SUGAR) TWICE DAILY 11/23/19   [provider]  vitamin E (VITAMIN E) 400 UNIT capsule Take 400 Units by mouth daily.    [provider]    Physical Exam: Constitutional: Moderately built and nourished. Vitals:   02/07/20 2315 02/07/20 2330 02/07/20 2345 02/08/20 0000  BP: (!) 160/65 (!) 183/48 (!) 170/59 (!) 164/54  Pulse: 72 70 69 70  Resp: 19 20 20 20   Temp:      TempSrc:      SpO2: 95% 95% 95% 95%   Eyes: Anicteric no pallor. ENMT: No discharge from the ears eyes nose or mouth. Neck: No mass felt.  No neck rigidity.  No JVD appreciated. Respiratory: No rhonchi or crepitations. Cardiovascular: S1-S2 heard. Abdomen: Soft nontender bowel sounds present. Musculoskeletal: Pain on moving right hip. Skin: No rash. Neurologic: Alert awake oriented to time place and person.  Moves all extremities. Psychiatric: Appears normal.  Normal affect.  Labs on Admission: I have personally reviewed following labs and imaging studies  CBC: Recent Labs  Lab 02/01/20 1328 02/07/20 2320  WBC 8.0 14.3*  NEUTROABS 4.9 12.2*  HGB 11.4* 11.4*  HCT 35.6* 37.0  MCV 90.8 93.4  PLT 172 182   Basic Metabolic Panel: Recent Labs  Lab 02/01/20 1328 02/07/20 2320  NA 137 137  K 5.1 4.9  CL 102 101  CO2 26 25  GLUCOSE 128* 289*  BUN 40* 41*  CREATININE 1.42* 1.42*  CALCIUM 10.9* 9.3   GFR: Estimated Creatinine Clearance: 26.6 mL/min (A) (by C-G formula based on SCr of 1.42 mg/dL (H)). Liver Function Tests: Recent Labs  Lab 02/01/20 1328  AST 22  ALT 14  ALKPHOS 77  BILITOT 0.4  PROT 7.9  ALBUMIN 4.4   No results for input(s): LIPASE, AMYLASE in the last 168 hours. No results for input(s): AMMONIA in the last 168 hours. Coagulation Profile: Recent Labs  Lab 02/07/20 2320  INR 1.0   Cardiac Enzymes: No results for input(s): CKTOTAL, CKMB, CKMBINDEX, TROPONINI in the last 168 hours. BNP (last 3 results) No  results for input(s): PROBNP in the last 8760 hours. HbA1C: No results for input(s): HGBA1C in the last 72 hours. CBG: No results for input(s): GLUCAP in the last 168 hours. Lipid Profile: No results for input(s): CHOL, HDL, LDLCALC, TRIG, CHOLHDL, LDLDIRECT in the last 72 hours. Thyroid Function Tests: No results for input(s): TSH, T4TOTAL, FREET4, T3FREE, THYROIDAB in the last 72 hours. Anemia Panel: No results for input(s): VITAMINB12, FOLATE, FERRITIN, TIBC, IRON, RETICCTPCT in the last 72 hours. Urine analysis:    Component Value Date/Time   COLORURINE YELLOW 11/01/2017 [place] 11/01/2017 1727   LABSPEC 1.008 11/01/2017 1727   PHURINE 6.0 11/01/2017 1727   GLUCOSEU NEGATIVE 11/01/2017 1727   HGBUR SMALL (A) 11/01/2017 1727   BILIRUBINUR NEGATIVE 11/01/2017 1727   KETONESUR NEGATIVE 11/01/2017 1727   PROTEINUR 30 (A) 11/01/2017 1727   UROBILINOGEN 0.2 10/22/2010 1936   NITRITE NEGATIVE 11/01/2017 1727   LEUKOCYTESUR TRACE (A) 11/01/2017 1727   Sepsis Labs: @LABRCNTIP (procalcitonin:4,lacticidven:4) ) Recent Results (from the past 240 hour(s))  Respiratory Panel by RT PCR (Flu A&B, Covid) - Nasopharyngeal Swab     Status: None   Collection Time: 02/07/20 11:20 PM   Specimen: Nasopharyngeal Swab  Result Value Ref Range Status   SARS Coronavirus 2 by RT PCR NEGATIVE NEGATIVE Final    Comment: (NOTE) SARS-CoV-2 target nucleic acids are NOT DETECTED.  The SARS-CoV-2 RNA is generally detectable in upper respiratoy specimens during the acute phase of infection. The lowest concentration of SARS-CoV-2 viral copies this assay can detect is 131 copies/mL. A negative result does not preclude SARS-Cov-2 infection and should not be used as the sole basis for treatment or other patient management decisions. A negative result may occur with  improper specimen collection/handling, submission of specimen other than nasopharyngeal swab, presence of viral mutation(s)  within the areas targeted by this assay, and inadequate number of viral copies (<131 copies/mL). A negative result must be combined with clinical observations, patient history, and epidemiological information. The expected result is Negative.  Fact Sheet for Patients:  PinkCheek.be  Fact Sheet for Healthcare Providers:  GravelBags.it  This test is no t yet approved or cleared by the [place] FDA and  has been authorized for detection and/or diagnosis of SARS-CoV-2 by FDA under an Emergency Use Authorization (EUA). This EUA will remain  in effect (meaning this  test can be used) for the duration of the COVID-19 declaration under Section 564(b)(1) of the Act, 21 U.S.C. section 360bbb-3(b)(1), unless the authorization is terminated or revoked sooner.     Influenza A by PCR NEGATIVE NEGATIVE Final   Influenza B by PCR NEGATIVE NEGATIVE Final    Comment: (NOTE) The Xpert Xpress SARS-CoV-2/FLU/RSV assay is intended as an aid in  the diagnosis of influenza from Nasopharyngeal swab specimens and  should not be used as a sole basis for treatment. Nasal washings and  aspirates are unacceptable for Xpert Xpress SARS-CoV-2/FLU/RSV  testing.  Fact Sheet for Patients: PinkCheek.be  Fact Sheet for Healthcare Providers: GravelBags.it  This test is not yet approved or cleared by the [place] FDA and  has been authorized for detection and/or diagnosis of SARS-CoV-2 by  FDA under an Emergency Use Authorization (EUA). This EUA will remain  in effect (meaning this test can be used) for the duration of the  Covid-19 declaration under Section 564(b)(1) of the Act, 21  U.S.C. section 360bbb-3(b)(1), unless the authorization is  terminated or revoked. Performed at [facility], [place] [address] [place], [place] [address]      Radiological Exams on Admission: DG Chest  1 View  Result Date: 02/07/2020 CLINICAL DATA:  Tripped and fell, right femoral fracture EXAM: CHEST  1 VIEW COMPARISON:  12/25/2017 FINDINGS: Single frontal view of the chest demonstrates dual lead pacer overlying left chest. Cardiac silhouette remains enlarged. There is increased central vascular congestion. Patchy bilateral airspace disease may reflect edema. No effusion or pneumothorax. No acute bony abnormalities. IMPRESSION: 1. Findings most consistent with mild congestive heart failure. Superimposed infection cannot be excluded. Electronically Signed   By: Randa Ngo M.D.   On: 02/07/2020 23:16   DG Pelvis 1-2 Views  Result Date: 02/07/2020 CLINICAL DATA:  Pain EXAM: PELVIS - 1-2 VIEW COMPARISON:  None. FINDINGS: There is a partially visualized acute displaced fracture of the proximal right femur. There is diffuse osteopenia. There is an old healed fracture of the inferior pubic ramus on the right. IMPRESSION: Partially visualized acute displaced fracture of the proximal right femur. Electronically Signed   By: Constance Holster M.D.   On: 02/07/2020 23:14   DG Femur Min 2 Views Right  Result Date: 02/07/2020 CLINICAL DATA:  Tripped and fell EXAM: RIGHT FEMUR 2 VIEWS COMPARISON:  None. FINDINGS: Frontal and lateral views of the right femur demonstrate a comminuted proximal right femoral fracture, involving the lesser trochanter and proximal femoral diaphysis. There is varus angulation and impaction at the fracture site. No dislocation. Right knee arthroplasty is identified without complication. Extensive atherosclerosis is noted. IMPRESSION: 1. Comminuted proximal right femoral fracture, involving the proximal femoral diaphysis and lesser trochanter. Varus angulation and impaction. Electronically Signed   By: Randa Ngo M.D.   On: 02/07/2020 23:12    EKG: Independently reviewed.  Normal sinus rhythm LBBB.  Assessment/Plan Principal Problem:   Fracture, proximal femur, right, closed,  initial encounter ([place]) Active Problems:   Essential hypertension   Type 2 diabetes mellitus with renal manifestations, controlled (H[facility])   Chronic diastolic CHF (congestive heart failure) (H[facility])   Interstitial lung disease ([place])   Pacemaker   CRI (chronic renal insufficiency), stage 4 (severe) ([name])    1. Right proximal femur fracture -mechanical fall.  Given the extensive cardiac history will also consult cardiology.  We will keep patient n.p.o. in anticipation of surgery.  Per report patient is not on any anticoagulation. 2. Diabetes mellitus  type 2 uncontrolled with hyperglycemia -patient blood sugars in the 200s.  Will check hemoglobin A1c with next blood draw.  For now I kept patient on sliding scale coverage.  At home patient takes glimepiride. 3. Chronic diastolic CHF chest x-ray shows congestion but patient is not hypoxic or denies any shortness of breath.  For now holding Lasix restart once after surgery. 4. History of A. fib not on anticoagulation due to history of GI bleed.  Status post AV nodal ablation and pacemaker placement not on anticoagulation due to GI bleed. 5. Leukocytosis likely reactive. 6. Hypothyroidism on Synthroid. 7. Chronic kidney disease stage III creatinine appears to be at baseline.  Since patient has fracture will need close monitoring and more than 2 midnight stay in inpatient status.   DVT prophylaxis: SCDs.  In anticipation of surgery will hold anticoagulation for now. Code Status: Full code. Family Communication: Discussed with patient. Disposition Plan: May need rehab. Consults called: Orthopedics and cardiology. Admission status: Inpatient.   Rise Patience MD [facility] Pager [phone].  If 7PM-7AM, please contact night-coverage [email or web address] Password [email or web address]  02/08/2020, 1:11 AM

## 2020-02-08 NOTE — ED Notes (Signed)
Visitor at bedside.

## 2020-02-08 NOTE — Anesthesia Procedure Notes (Signed)
Date/Time: 02/08/2020 4:06 PM Performed by: Sharlette Dense, CRNA Oxygen Delivery Method: Simple face mask

## 2020-02-08 NOTE — Transfer of Care (Signed)
Immediate Anesthesia Transfer of Care Note  Patient: Ann Fletcher  Procedure(s) Performed: Procedure(s): INTRAMEDULLARY (IM) NAIL INTERTROCHANTRIC (Right)  Patient Location: PACU  Anesthesia Type:Spinal  Level of Consciousness: awake, alert  and oriented  Airway & Oxygen Therapy: Patient Spontanous Breathing  Post-op Assessment: Report given to RN and Post -op Vital signs reviewed and stable  Post vital signs: Reviewed and stable  Last Vitals:  Vitals:   02/08/20 1332 02/08/20 1444  BP: (!) 164/71 (!) 173/67  Pulse: 70 73  Resp: 16 18  Temp: 36.8 C (!) 36.3 C  SpO2: 25% 18%    Complications: No apparent anesthesia complications

## 2020-02-08 NOTE — ED Notes (Signed)
[facility] on unit to transport pt to [facility]. Pt off unit via stretcher. Pt transported to [facility] 3W per MD order. No s/s of acute distress noted.

## 2020-02-08 NOTE — Consult Note (Signed)
Cardiology Consultation:   Patient ID: Ann Fletcher; [id number]; Sep 28, 1932   Admit date: 02/07/2020 Date of Consult: 02/08/2020  Primary Care Provider: [name], MD Primary Cardiologist: Dr. Quay Burow, MD  Primary Electrophysiologist: Dr. [name], MD   Patient Profile:   Ann Fletcher is a 86 y.o. female with a hx of HTN, DM2, HLD, chronic atrial fibrillation s/p unsuccessful DCCV>>now s/p AV nodal ablation with DDD [employer] PPM placement per Dr. Lovena Le 08/23/2017 not on [facility] due to hx of GI bleeding/falls, chronic diastolic HF with normal EF per echo, pulmonary fibrosis who is being seen today for preoperative evaluation prior to right hip IM nail placement at the request of Merlene Pulling PA.  History of Present Illness:   Ms. Rawlinson is an 86 yo F with a hx as stated above who presented to [facility] 02/08/20 after sustaining a mechanical fall at home. Pt reports that she slipped and fell while waking with her walker at home with significant right hip pain. Given this EMS was called and she was found to have a right proximal femur fracture. She was seen by orthopedics with plans to transfer to [facility] for surgical fixation with IM nail per Dr. Griffin Basil.  Pt reports that she lives  At home with her husband. She ambulates baseline with a walker. She is relatively sedentary at baseline however reports that she has been recently working with PT and states that she will walk down the street without chest pain, abnormal SOB or other anginal symptoms. She performs her own ADL/IADLs without issues. She does not clean her house as she has assistance with this. Most of her mobility is limited by age and orthopedic issues however it appears that she still is unable to perform greater than 4 METS at baseline. She does not appear to be fluid volume overloaded on exam and is not anticoagulated for her AF due to hx of falls and GI bleed.    She is followed by [name] for her cardiology care. She was  initally referred to him for preoperative cardiac clearance prior to back surgery. She was noted to have been hospitalized for AF with RVR. DCCV was attempted however this was unsuccessful. She was treated with amiodarone and Eliquis and was diuresed for HF felt to be secondary to AF. Ultimately her amiodarone was decreased due to junctional bradycardia and she remained on Eliquis. Later she had issues with GI bleeding so this was discontinued.   She had recurrent AF and was seen by EP and ultimately underwent AV nodal ablation per Dr. Lovena Le with DDD PPM placement. She was most recently seen by [name] 10/20/19 and was doing well with no change in therapy. In follow up with Dr. Lovena Le 12/17/19 at which time PPM was functioning well. She did have exit block with her atrial lead at implantation which was turned off and she has been V pacing without symptoms.   Past Medical History:  Diagnosis Date  . Anxiety   . Arthritis   . AV node arrhythmia    a. s/p AV node ablation/PPM 07/2017.  [name] Chronic diastolic CHF (congestive heart failure) ([place])    a. Acute exacerbation occurred in setting of AF.  [name] CKD (chronic kidney disease), stage III ([place])   . Diabetes mellitus without complication ([place])    type 2  . 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 H/O bladder infections   . Hyperlipidemia   . Hypertension   . Hypothyroidism   .  Lower extremity edema   . Osteoporosis   . Persistent atrial fibrillation ([place])    a. diagnosed in 10/2014 by PCP, underwent failed TEE DCCV on 10/08/14, loaded with amio, successfully DCCV on 10/15/14.    Past Surgical History:  Procedure Laterality Date  . AV NODE ABLATION N/A 08/23/2017   Procedure: AV NODE ABLATION;  Surgeon: [name], MD;  Location: [place] [facility];  Service: Cardiovascular;  Laterality: N/A;  . BACK SURGERY  2001, 2003, 2006   x 3  . BIOPSY  11/06/2017   Procedure: BIOPSY;  Surgeon: [name], MD;  Location: [place];  Service:  Endoscopy;;  . CARDIOVERSION N/A 10/08/2014   Procedure: CARDIOVERSION;  Surgeon: [name], MD;  Location: [place];  Service: Cardiovascular;  Laterality: N/A;  . CARDIOVERSION N/A 10/15/2014   Procedure: CARDIOVERSION;  Surgeon: [name], MD;  Location: [facility] ENDOSCOPY;  Service: Cardiovascular;  Laterality: N/A;  . CARDIOVERSION N/A 11/15/2014   Procedure: CARDIOVERSION;  Surgeon: [name], MD;  Location: [place];  Service: Cardiovascular;  Laterality: N/A;  . COLONOSCOPY N/A 11/06/2017   Procedure: COLONOSCOPY;  Surgeon: [name], MD;  Location: [facility] ENDOSCOPY;  Service: Endoscopy;  Laterality: N/A;  . ESOPHAGOGASTRODUODENOSCOPY N/A 11/06/2017   Procedure: ESOPHAGOGASTRODUODENOSCOPY (EGD);  Surgeon: [name], MD;  Location: [facility] ENDOSCOPY;  Service: Endoscopy;  Laterality: N/A;  . PACEMAKER IMPLANT N/A 08/23/2017   Procedure: PACEMAKER IMPLANT;  Surgeon: [name], MD;  Location: [place] [facility];  Service: Cardiovascular;  Laterality: N/A;  . POLYPECTOMY  11/06/2017   Procedure: POLYPECTOMY;  Surgeon: [name], MD;  Location: [facility] ENDOSCOPY;  Service: Endoscopy;;  . 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 TEE WITHOUT CARDIOVERSION N/A 10/08/2014   Procedure: TRANSESOPHAGEAL ECHOCARDIOGRAM (TEE);  Surgeon: [name], MD;  Location: [place];  Service: Cardiovascular;  Laterality: N/A;  . TUBAL LIGATION       Prior to Admission medications   Medication Sig Start Date End Date Taking? Authorizing Provider  acetaminophen (TYLENOL) 500 MG tablet Take 1,000 mg by mouth at bedtime.  11/01/19  Yes [provider]  albuterol (PROVENTIL HFA;VENTOLIN HFA) 108 (90 BASE) MCG/ACT inhaler Inhale 2 puffs into the lungs 3 (three) times daily as needed for wheezing or shortness of breath.    Yes [provider]  aspirin 81 MG chewable tablet Chew 81 mg by mouth daily.   Yes [provider]  atorvastatin (LIPITOR) 40 MG tablet Take 40 mg by  mouth at bedtime.    Yes [provider]  Calcium Carbonate-Vitamin D (OSCAL 500/200 D-3 PO) Take 1 tablet by mouth daily.    Yes [provider]  Cyanocobalamin (VITAMIN B 12 PO) Take 1 tablet by mouth daily.   Yes [provider]  fluticasone (FLONASE) 50 MCG/ACT nasal spray Place 1 spray into both nostrils daily as needed for allergies.  11/02/14  Yes [provider]  furosemide (LASIX) 80 MG tablet TAKE ONE WHOLE TABLET (80MG ) IN THE MORNING AND HALF TABLET (40 MG) IN THE EVENING. Patient taking differently: Take 80 mg by mouth daily.  11/17/18  Yes Almyra Deforest, PA  glimepiride (AMARYL) 2 MG tablet Take 2 mg by mouth every morning. 03/30/19  Yes [provider]  levothyroxine (SYNTHROID, LEVOTHROID) 100 MCG tablet Take 100 mcg by mouth daily before breakfast.  10/28/14  Yes [provider]  LORazepam (ATIVAN) 1 MG tablet Take 1 mg by mouth 3 (three) times daily as needed  for anxiety.    Yes [provider]  potassium chloride SA (K-DUR,KLOR-CON) 20 MEQ tablet Take 20 mEq by mouth 2 (two) times daily.  01/06/18  Yes Almyra Deforest, PA  vitamin E (VITAMIN E) 180 MG (400 UNITS) capsule Take 400 Units by mouth daily.    Yes [provider]  Lancet Devices (PRODIGY LANCING DEVICE) MISC See admin instructions. 11/23/19   [provider]  Prodigy Twist Top Lancets 28G MISC CHECK BLOOD GLUCOSE (SUGAR) TWICE DAILY 11/23/19   [provider]    Inpatient Medications: Scheduled Meds: . atorvastatin  40 mg Oral QHS  . insulin aspart  0-9 Units Subcutaneous Q4H  . levothyroxine  100 mcg Oral QAC breakfast   Continuous Infusions:  PRN Meds: albuterol, LORazepam, morphine injection  Allergies:    Allergies  Allergen Reactions  . Tizanidine Hcl Other (See Comments)    Dizziness, patient reports fall  . Nsaids Itching and Other (See Comments)    Stomach cramps   . Pradaxa [Dabigatran Etexilate Mesylate] Nausea And  Vomiting  . Sulfa Antibiotics Nausea And Vomiting  . Betadine [Povidone Iodine] Itching and Rash    Social History:   Social History   Socioeconomic History  . Marital status: Married    Spouse name: Not on file  . Number of children: Not on file  . Years of education: Not on file  . Highest education level: Not on file  Occupational History  . Not on file  Tobacco Use  . Smoking status: Never Smoker  . Smokeless tobacco: Never Used  Vaping Use  . Vaping Use: Never used  Substance and Sexual Activity  . Alcohol use: No  . Drug use: No  . Sexual activity: Yes    Birth control/protection: Surgical  Other Topics Concern  . Not on file  Social History Narrative  . Not on file   Social Determinants of Health   Financial Resource Strain:   . Difficulty of Paying Living Expenses: Not on file  Food Insecurity:   . Worried About Charity fundraiser in the Last Year: Not on file  . Ran Out of Food in the Last Year: Not on file  Transportation Needs:   . Lack of Transportation (Medical): Not on file  . Lack of Transportation (Non-Medical): Not on file  Physical Activity:   . Days of Exercise per Week: Not on file  . Minutes of Exercise per Session: Not on file  Stress:   . Feeling of Stress : Not on file  Social Connections:   . Frequency of Communication with Friends and Family: Not on file  . Frequency of Social Gatherings with Friends and Family: Not on file  . Attends Religious Services: Not on file  . Active Member of Clubs or Organizations: Not on file  . Attends Archivist Meetings: Not on file  . Marital Status: Not on file  Intimate Partner Violence:   . Fear of Current or Ex-Partner: Not on file  . Emotionally Abused: Not on file  . Physically Abused: Not on file  . Sexually Abused: Not on file    Family History:   Family History  Problem Relation Age of Onset  . Pneumonia Mother   . Heart attack Father   . Diabetes Sister    Family Status:    Family Status  Relation Name Status  . Mother  Deceased  . Father  Deceased  . Sister  Deceased    ROS:  Please  see the history of present illness.  All other ROS reviewed and negative.     Physical Exam/Data:   Vitals:   02/08/20 0826 02/08/20 0830 02/08/20 0930 02/08/20 1000  BP: 139/83 (!) 150/56 (!) 167/49 (!) 170/56  Pulse: 69 69 70 69  Resp: (!) 24 (!) 26 (!) 24 (!) 23  Temp:      TempSrc:      SpO2: 94% 96% 95% 96%   No intake or output data in the 24 hours ending 02/08/20 1017 There were no vitals filed for this visit. There is no height or weight on file to calculate BMI.   General: Elderly,  NAD Neck: Negative for carotid bruits. No JVD Lungs:Clear to ausculation bilaterally. No wheezes, rales, or rhonchi. Breathing is unlabored. Cardiovascular: RRR. No murmurs Abdomen: Soft, non-tender, non-distended. No obvious abdominal masses. Extremities: No edema. Radial  pulses 2+ bilaterally Neuro: Alert and oriented. No focal deficits. No facial asymmetry. MAE spontaneously. Psych: Responds to questions appropriately with normal affect.     EKG:  The EKG was personally reviewed and demonstrates: 02/07/20 NSR with V pacing  Telemetry:  Telemetry was personally reviewed and demonstrates: 02/08/20 NSR with V pacing   Relevant CV Studies:  ECHO 11/01/2017:  - Left ventricle: The cavity size was normal. There was severe  concentric hypertrophy. Systolic function was normal. The  estimated ejection fraction was in the range of 60% to 65%. Wall  motion was normal; there were no regional wall motion  abnormalities. The study is not technically sufficient to allow  evaluation of LV diastolic function.  - Aortic valve: Trileaflet; mildly thickened, mildly calcified  leaflets.  - Mitral valve: There was mild regurgitation.  - Right ventricle: The cavity size was moderately dilated. Wall  thickness was normal. The moderator band was prominent. Pacer  wire or  catheter noted in right ventricle. Systolic function was  mildly to moderately reduced.  - Right atrium: The atrium was moderately dilated. Pacer wire or  catheter noted in right atrium.  - Tricuspid valve: There was moderate regurgitation.  - Pericardium, extracardiac: A trivial, free-flowing pericardial  effusion was identified circumferential to the heart. The fluid  had no internal echoes.There was no evidence of hemodynamic   Laboratory Data:  Chemistry Recent Labs  Lab 02/01/20 1328 02/07/20 2320 02/08/20 0500  NA 137 137 140  K 5.1 4.9 4.8  CL 102 101 105  CO2 26 25 26   GLUCOSE 128* 289* 184*  BUN 40* 41* 37*  CREATININE 1.42* 1.42* 1.39*  CALCIUM 10.9* 9.3 9.1  GFRNONAA 36* 36* 37*  ANIONGAP 9 11 9     Total Protein  Date Value Ref Range Status  02/01/2020 7.9 6.5 - 8.1 g/dL Final   Albumin  Date Value Ref Range Status  02/01/2020 4.4 3.5 - 5.0 g/dL Final   AST  Date Value Ref Range Status  02/01/2020 22 15 - 41 U/L Final   ALT  Date Value Ref Range Status  02/01/2020 14 0 - 44 U/L Final   Alkaline Phosphatase  Date Value Ref Range Status  02/01/2020 77 38 - 126 U/L Final   Total Bilirubin  Date Value Ref Range Status  02/01/2020 0.4 0.3 - 1.2 mg/dL Final   Hematology Recent Labs  Lab 02/01/20 1328 02/01/20 1329 02/07/20 2320 02/08/20 0500  WBC 8.0  --  14.3* 12.4*  RBC 3.92 3.89 3.96 3.44*  HGB 11.4*  --  11.4* 10.0*  HCT 35.6*  --  37.0 31.5*  MCV 90.8  --  93.4 91.6  MCH 29.1  --  28.8 29.1  MCHC 32.0  --  30.8 31.7  RDW 12.6  --  12.5 12.6  PLT 172  --  174 174   Cardiac EnzymesNo results for input(s): TROPONINI in the last 168 hours. No results for input(s): TROPIPOC in the last 168 hours.  BNPNo results for input(s): BNP, PROBNP in the last 168 hours.  DDimer No results for input(s): DDIMER in the last 168 hours. TSH:  Lab Results  Component Value Date   TSH 0.553 07/04/2017   Lipids:No results found for: CHOL, HDL,  LDLCALC, LDLDIRECT, TRIG, CHOLHDL HgbA1c: Lab Results  Component Value Date   HGBA1C 7.6 (H) 06/19/2016    Radiology/Studies:  DG Chest 1 View  Result Date: 02/07/2020 CLINICAL DATA:  Tripped and fell, right femoral fracture EXAM: CHEST  1 VIEW COMPARISON:  12/25/2017 FINDINGS: Single frontal view of the chest demonstrates dual lead pacer overlying left chest. Cardiac silhouette remains enlarged. There is increased central vascular congestion. Patchy bilateral airspace disease may reflect edema. No effusion or pneumothorax. No acute bony abnormalities. IMPRESSION: 1. Findings most consistent with mild congestive heart failure. Superimposed infection cannot be excluded. Electronically Signed   By: Randa Ngo M.D.   On: 02/07/2020 23:16   DG Pelvis 1-2 Views  Result Date: 02/07/2020 CLINICAL DATA:  Pain EXAM: PELVIS - 1-2 VIEW COMPARISON:  None. FINDINGS: There is a partially visualized acute displaced fracture of the proximal right femur. There is diffuse osteopenia. There is an old healed fracture of the inferior pubic ramus on the right. IMPRESSION: Partially visualized acute displaced fracture of the proximal right femur. Electronically Signed   By: Constance Holster M.D.   On: 02/07/2020 23:14   DG Femur Min 2 Views Right  Result Date: 02/07/2020 CLINICAL DATA:  Tripped and fell EXAM: RIGHT FEMUR 2 VIEWS COMPARISON:  None. FINDINGS: Frontal and lateral views of the right femur demonstrate a comminuted proximal right femoral fracture, involving the lesser trochanter and proximal femoral diaphysis. There is varus angulation and impaction at the fracture site. No dislocation. Right knee arthroplasty is identified without complication. Extensive atherosclerosis is noted. IMPRESSION: 1. Comminuted proximal right femoral fracture, involving the proximal femoral diaphysis and lesser trochanter. Varus angulation and impaction. Electronically Signed   By: Randa Ngo M.D.   On: 02/07/2020 23:12     Assessment and Plan:   1. Pre-operative cardiac assessment: -The patient does not have any unstable cardiac conditions. Upon evaluation today, she cannot achieve 4 METs due to orthopedic issues and advanced age however ambulates baseline with a walker. She is relatively sedentary at baseline but reports that she has been recently working with PT and states that she will walk down the street without chest pain, abnormal SOB or other anginal symptoms. She performs her own ADL/IADLs without issues. She does not clean her house as she has assistance with this. She does not appear to be fluid volume overloaded on exam and is not anticoagulated for her AF due to hx of falls and GI bleed. According to [facility] and AHA guidelines, she requires no further cardiac workup prior to her surgery and should be at moderate but acceptable risk. Her revised cardiac risk of peri-procedural MI or cardiac arrest following surgery is low.   -Will need to be mindful of intra and post operative fluid volume>>will likely need IV Lasix at that time given PTA dosing at 80AM and 40PM   2. Chronic  AF s/p AV nodal ablation with DDD PPM placement 08/23/2017: -Follows with EP-Dr. Lovena Le -No on anticoagulation due to high fall risk and hx of GI bleeding  -Device functioning normally at last remote download   3. Chronic diastolic CHF: -Last echocardiogram 11/01/2017 with LV EF at 60-65%  -Maintained on oral Lasix 80mg  in AM and 40mg  in PM  -Appears euvolemic on exam  -Watch intra and post operative fluid status closely    4. HTN: -Elevated, 170/56>>>167/49>>>150/56 -Elevated pressure likely in the setting of pain   -May need to add antihypertensives in the post operative setting  5. HLD: -Last LDL, 51 06/16/19 -Continue atorvastatin 40 QD   For questions or updates, please contact [place] Please consult [email or web address] for contact info under Cardiology/STEMI.   SignedKathyrn Drown NP-C [facility] Pager:  [phone] 02/08/2020 10:17 AM

## 2020-02-09 ENCOUNTER — Other Ambulatory Visit: Payer: Self-pay

## 2020-02-09 DIAGNOSIS — N184 Chronic kidney disease, stage 4 (severe): Secondary | ICD-10-CM

## 2020-02-09 DIAGNOSIS — Z950 Presence of cardiac pacemaker: Secondary | ICD-10-CM

## 2020-02-09 LAB — GLUCOSE, CAPILLARY
Glucose-Capillary: 178 mg/dL — ABNORMAL HIGH (ref 70–99)
Glucose-Capillary: 288 mg/dL — ABNORMAL HIGH (ref 70–99)
Glucose-Capillary: 191 mg/dL — ABNORMAL HIGH (ref 70–99)
Glucose-Capillary: 185 mg/dL — ABNORMAL HIGH (ref 70–99)
Glucose-Capillary: 217 mg/dL — ABNORMAL HIGH (ref 70–99)

## 2020-02-09 LAB — CBC
HCT: 25.4 % — ABNORMAL LOW (ref 36.0–46.0)
Hemoglobin: 8 g/dL — ABNORMAL LOW (ref 12.0–15.0)
MCH: 29.7 pg (ref 26.0–34.0)
MCHC: 31.5 g/dL (ref 30.0–36.0)
MCV: 94.4 fL (ref 80.0–100.0)
Platelets: 139 10*3/uL — ABNORMAL LOW (ref 150–400)
RBC: 2.69 MIL/uL — ABNORMAL LOW (ref 3.87–5.11)
RDW: 12.8 % (ref 11.5–15.5)
WBC: 10.7 10*3/uL — ABNORMAL HIGH (ref 4.0–10.5)
nRBC: 0 % (ref 0.0–0.2)

## 2020-02-09 LAB — BASIC METABOLIC PANEL
Anion gap: 9 (ref 5–15)
BUN: 37 mg/dL — ABNORMAL HIGH (ref 8–23)
CO2: 24 mmol/L (ref 22–32)
Calcium: 8.6 mg/dL — ABNORMAL LOW (ref 8.9–10.3)
Chloride: 103 mmol/L (ref 98–111)
Creatinine, Ser: 1.51 mg/dL — ABNORMAL HIGH (ref 0.44–1.00)
GFR, Estimated: 34 mL/min — ABNORMAL LOW (ref >60)
Glucose, Bld: 158 mg/dL — ABNORMAL HIGH (ref 70–99)
Potassium: 4.3 mmol/L (ref 3.5–5.1)
Sodium: 136 mmol/L (ref 135–145)

## 2020-02-09 MED ORDER — GLUCERNA SHAKE PO LIQD
237.0000 mL | Freq: Two times a day (BID) | ORAL | Status: DC
  Administered 2020-02-09 – 2020-02-11 (×4): 237 mL via ORAL
  Filled 2020-02-09 (×5): qty 237

## 2020-02-09 NOTE — Progress Notes (Signed)
Initial Nutrition Assessment  DOCUMENTATION CODES:   Not applicable  INTERVENTION:  - will order Glucerna Shake BID, each supplement provides 220 kcal and 10 grams of protein.   NUTRITION DIAGNOSIS:   Increased nutrient needs related to post-op healing as evidenced by estimated needs.  GOAL:   Patient will meet greater than or equal to 90% of their needs  MONITOR:   PO intake, Supplement acceptance, Labs, Weight trends  REASON FOR ASSESSMENT:   Consult Hip fracture protocol  ASSESSMENT:   86 y.o. female with medical history of A. fib not on anticoagulation 2/2 hx of GIB, AV nodal ablation s/p pacemaker placement, CHF, HTN, DM, stage 3 CKD, and hypothyroidism. She slipped and fell at home. She denied hitting her head or losing consciousness. After the fall, she continued to have R hip-area pain.  Patient sitting in the chair at the time of visit. No family/visitors present although she reports her husband is visiting later today and her son plans to visit tomorrow.  Visit with patient was short as she was experiencing pain d/t sitting in the chair and wanted staff to move her back to the bed to help alleviate pain.   She did not eat breakfast but requested an Ensure and drank most of that this AM. She reports that after drinking supplement, her blood sugar was over 200 mg/dl.   Weight yesterday was 148 lb and weight was previously stable (140-143 lb) from 04/20/19-12/17/19. Non-pitting edema to RLE documented in the flow sheet.    Labs reviewed; CBGs: 178 and 288 mg/dl, BUN: 37 mg/dl, creatinine: 1.51 mg/dl, Ca: 8.6 mg/dl, GFR: 34 ml/min.  Medications reviewed; 100 mg colace BID, sliding scale novolog, 100 mcg oral synthroid/day.    NUTRITION - FOCUSED PHYSICAL EXAM:  completed to upper body only; no fat depletions or muscle depletions noted.   Diet Order:   Diet Order            Diet regular Room service appropriate? Yes; Fluid consistency: Thin  Diet effective now                  EDUCATION NEEDS:   No education needs have been identified at this time  Skin:  Skin Assessment: Skin Integrity Issues: Skin Integrity Issues:: Incisions Incisions: R hip (11/8)  Last BM:  11/7  Height:   Ht Readings from Last 1 Encounters:  02/08/20 5\' 3"  (1.6 m)    Weight:   Wt Readings from Last 1 Encounters:  02/08/20 67 kg     Estimated Nutritional Needs:  Kcal:  1650-1825 kcal Protein:  75-85 grams Fluid:  >/= 1.7 L/day     Jarome Matin, MS, RD, LDN, CNSC Inpatient Clinical Dietitian RD pager # available in AMION  After hours/weekend pager # available in [facility]

## 2020-02-09 NOTE — Progress Notes (Signed)
   ORTHOPAEDIC PROGRESS NOTE  s/p Procedure(s): INTRAMEDULLARY (IM) NAIL INTERTROCHANTRIC  SUBJECTIVE: Reports soreness in her right hip. Can't lay on her right side. No chest pain. No SOB. No nausea/vomiting. No other complaints.  OBJECTIVE: PE: General: resting in hospital bed, NAD Pulm: no increased work of breathing Cardiac: regular rate Right lower extremity: dressings CDI, tender to palpation right hip, intact EHL/TA/GSC, endorses distal sensation, SCD in place, warm well perfused foot   Vitals:   02/08/20 2132 02/09/20 0117  BP: (!) 146/64 (!) 146/60  Pulse: 69 70  Resp: 18 16  Temp: 98.2 F (36.8 C) 98.1 F (36.7 C)  SpO2: 100% 100%     ASSESSMENT: Ann Fletcher is a 86 y.o. female doing well postoperatively. POD#1  PLAN: Weightbearing: WBAT RLE Insicional and dressing care: Reinforce dressings as needed Orthopedic device(s): None Showering: Post-op day #2 with assistance VTE prophylaxis: Lovenox 30 mg QD Pain control: PRN pain medications, preferring oral medications ABLA: Hgb 8.0. Hemodynamically stable. Continue to monitor.  Follow - up plan: 2 weeks in office for incision check and XR.  Contact information:  Weekdays 8-5 Noemi Chapel PA-C [phone], After hours and holidays please check [email or web address] for group call information for [facility]  Dispo: PT/OT evals pending. Anticipate SNF. TOC consult placed.    Noemi Chapel, PA-C 02/09/2020

## 2020-02-09 NOTE — Progress Notes (Signed)
PROGRESS NOTE    Ann Fletcher  LMB:[id number] DOB: Apr 02, 1933 DOA: 02/07/2020 PCP: [name], MD    Brief Narrative:  Mrs. Matusik was admitted to the hospital with the working diagnosis of right proximal femur fracture.   86 year old female past medical history for atrial fibrillation, AV nodal ablation status post pacemaker, diastolic heart failure, hypertension, type 2 diabetes mellitus, chronic kidney disease stage III and hypothyroidism.  Patient sustained a mechanical fall from her own height.  She slipped and fell on her right hip region.  Due to persistent pain she was brought to the hospital.  On her initial physical examination blood pressure 160/65, heart rate 72, respirate 19, oxygen saturation 95%, her lungs are clear to auscultation bilaterally, heart S1-S2, present rhythm, soft abdomen, no lower extremity edema.  Positive pain with passive movement of right hip.  Sodium 137, potassium 4.9, chloride 101, bicarb 25, glucose 289, BUN 41, creatinine 1.42, white count 14.3, hemoglobin 9.4, hematocrit 37.0, platelets 174.  SARS COVID-19 negative. Right femur x-ray with comminuted proximal right femoral fracture involving the proximal femoral diaphysis lesser trochanter.  Varus angulation and impaction.  Chest radiograph with increased interstitial markings bilaterally.  Pacemaker in place. EKG with 70 bpm, left axis deviation, left bundle branch block, V paced, poor R wave progression, no ST segment changes, negative T wave lead I aVL, noisy baseline.  Assessment & Plan:   Principal Problem:   Fracture, proximal femur, right, closed, initial encounter ([place]) Active Problems:   Essential hypertension   Type 2 diabetes mellitus with renal manifestations, controlled (HCC)   Chronic diastolic CHF (congestive heart failure) (HCC)   Interstitial lung disease ([place])   Pacemaker   CRI (chronic renal insufficiency), stage 4 (severe) (HCC)   Hip fracture ([place])   1. Right  proximal femur fracture. Sp right femur cephalomedullary nail on 11/08. Patient out of bed to chair with physical therapy. Pain is well controlled with hydrocodone and morphine. Continue with dvt prophylaxis and PT/OT Follow with orthopedic recommendations  Patient will need SNF.  2. T2DM uncontrolled with hyperglycemia/ dyslipidemia. Patient is tolerating po well, continue with insulin sliding scale for glucose cover and monitoring.   Continue with statin therapy with atorvastatin.   3. Chronic diastolic heart failure. No signs of decompensation, will continue blood pressure monitoring.   4. Atrial fibrillation, paroxysmal. Continue rate controlled, patient has a pacer sp AV nodal ablation.   5. Hypothyroidism. Continue with levothyroxine    6. CKD stage 3. Stable renal function and electrolytes, continue close monitoring.   7. Reactive leukocytosis.  No signs of bacterial infection, continue to hold on antibiotic therapy, follow cell count .  8. Anxiety. As needed lorazepam.   Status is: Inpatient  Remains inpatient appropriate because:Inpatient level of care appropriate due to severity of illness   Dispo: The patient is from: Home              Anticipated d/c is to: SNF              Anticipated d/c date is: 3 days              Patient currently is not medically stable to d/c.   DVT prophylaxis: Enoxaparin   Code Status:   full  Family Communication:  No family at the bedside     Consultants:   Orthopedics   Procedures:   Right femur nail placement      Subjective: Patient is out of bed to chair, right hip  pain is controlled with analgesics, no nausea or vomiting, no chest pain or dyspnea.   Objective: Vitals:   02/08/20 2132 02/09/20 0117 02/09/20 0714 02/09/20 1010  BP: (!) 146/64 (!) 146/60 (!) 139/49 (!) 135/59  Pulse: 69 70 70 70  Resp: 18 16 17 18   Temp: 98.2 F (36.8 C) 98.1 F (36.7 C) 98.2 F (36.8 C)   TempSrc: Oral Oral Oral   SpO2: 100% 100%  100% 99%  Weight:      Height:        Intake/Output Summary (Last 24 hours) at 02/09/2020 1310 Last data filed at 02/09/2020 0700 Gross per 24 hour  Intake 1580 ml  Output 1475 ml  Net 105 ml   Filed Weights   02/08/20 1900  Weight: 67 kg    Examination:   General: deconditioned  Neurology: Awake and alert, non focal  E ENT: no pallor, no icterus, oral mucosa moist Cardiovascular: No JVD. S1-S2 present, rhythmic, no gallops, rubs, or murmurs. No lower extremity edema. Pulmonary: breath sounds bilaterally, adequate air movement, no wheezing, rhonchi or rales. Gastrointestinal. Abdomen soft and non tender Skin. No rashes Musculoskeletal: no joint deformities     Data Reviewed: I have personally reviewed following labs and imaging studies  CBC: Recent Labs  Lab 02/07/20 2320 02/08/20 0500 02/09/20 0320  WBC 14.3* 12.4* 10.7*  NEUTROABS 12.2*  --   --   HGB 11.4* 10.0* 8.0*  HCT 37.0 31.5* 25.4*  MCV 93.4 91.6 94.4  PLT 174 174 127*   Basic Metabolic Panel: Recent Labs  Lab 02/07/20 2320 02/08/20 0500 02/09/20 0320  NA 137 140 136  K 4.9 4.8 4.3  CL 101 105 103  CO2 25 26 24   GLUCOSE 289* 184* 158*  BUN 41* 37* 37*  CREATININE 1.42* 1.39* 1.51*  CALCIUM 9.3 9.1 8.6*   GFR: Estimated Creatinine Clearance: 25 mL/min (A) (by C-G formula based on SCr of 1.51 mg/dL (H)). Liver Function Tests: No results for input(s): AST, ALT, ALKPHOS, BILITOT, PROT, ALBUMIN in the last 168 hours. No results for input(s): LIPASE, AMYLASE in the last 168 hours. No results for input(s): AMMONIA in the last 168 hours. Coagulation Profile: Recent Labs  Lab 02/07/20 2320  INR 1.0   Cardiac Enzymes: No results for input(s): CKTOTAL, CKMB, CKMBINDEX, TROPONINI in the last 168 hours. BNP (last 3 results) No results for input(s): PROBNP in the last 8760 hours. HbA1C: No results for input(s): HGBA1C in the last 72 hours. CBG: Recent Labs  Lab 02/08/20 1037 02/08/20 1429  02/08/20 2133 02/09/20 0739 02/09/20 1150  GLUCAP 166* 169* 149* 178* 288*   Lipid Profile: No results for input(s): CHOL, HDL, LDLCALC, TRIG, CHOLHDL, LDLDIRECT in the last 72 hours. Thyroid Function Tests: No results for input(s): TSH, T4TOTAL, FREET4, T3FREE, THYROIDAB in the last 72 hours. Anemia Panel: No results for input(s): VITAMINB12, FOLATE, FERRITIN, TIBC, IRON, RETICCTPCT in the last 72 hours.    Radiology Studies: I have reviewed all of the imaging during this hospital visit personally     Scheduled Meds: . atorvastatin  40 mg Oral QHS  . docusate sodium  100 mg Oral BID  . enoxaparin (LOVENOX) injection  30 mg Subcutaneous Q24H  . insulin aspart  0-9 Units Subcutaneous TID AC & HS  . levothyroxine  100 mcg Oral QAC breakfast   Continuous Infusions:   LOS: 2 days        Anvith Mauriello Gerome Apley, MD

## 2020-02-09 NOTE — Evaluation (Signed)
Physical Therapy Evaluation Patient Details Name: Ann Fletcher MRN: [id number] DOB: 21-May-1932 Today's Date: 02/09/2020   History of Present Illness  86 y.o. female with history of A fib not on anticoagulation due to GI bleed, diastolic CHF, s/p R TKA,  HTN, DM, CKD, hypothyroidism, nodal ablation s/p pacemaker who suffered a proximal femur fx s/pfall at home. Underwent IM Nail R femur 02/08/20.  Clinical Impression  On eval, pt required Max-Total assist +2 for mobility. Max +2 to stand x 3 during session. Pt was unable to take any steps during session. Pt presents with general weakness, decreased activity tolerance, and impaired gait and balance. Moderate-severe pain with activity. PT recommendation is for SNF. Will plan to follow and progress activity as tolerated during hospital stay.     Follow Up Recommendations SNF    Equipment Recommendations  None recommended by PT    Recommendations for Other Services       Precautions / Restrictions Precautions Precautions: Fall Restrictions Weight Bearing Restrictions: No Other Position/Activity Restrictions: WBAT      Mobility  Bed Mobility Overal bed mobility: Needs Assistance Bed Mobility: Supine to Sit     Supine to sit: Total assist;+2 for physical assistance;+2 for safety/equipment;HOB elevated     General bed mobility comments: Assist for trunk and bil LEs. Utilized bed pad to aid with scooting/positioning. Pt was able to move L LE some. Limited by pain.    Transfers Overall transfer level: Needs assistance Equipment used: Rolling walker (2 wheeled) Transfers: Sit to/from Stand Sit to Stand: Max assist;+2 physical assistance;+2 safety/equipment;From elevated surface         General transfer comment: Sit to stand x 3 (partial stand with RW, stand using STEDY x 2). Assist to power up, stabilize, control descent. Cues for safety, technique, LE placement. Used STEDY to transfer to recliner. Pt was unable to take any steps  during this session  Ambulation/Gait             General Gait Details: NT-pt unable  Stairs            Wheelchair Mobility    Modified Rankin (Stroke Patients Only)       Balance Overall balance assessment: Needs assistance   Sitting balance-Leahy Scale: Fair Sitting balance - Comments: Leaning to L side to avoid weight on R     Standing balance-Leahy Scale: Poor                               Pertinent Vitals/Pain Pain Assessment: Faces Pain Score: 5  Faces Pain Scale: Hurts even more Pain Location: R hip Pain Descriptors / Indicators: Aching;Discomfort;Grimacing;Guarding;Moaning Pain Intervention(s): Limited activity within patient's tolerance;Monitored during session;Repositioned    Home Living Family/patient expects to be discharged to:: Skilled nursing facility Living Arrangements: Spouse/significant other Available Help at Discharge: Family;Available 24 hours/day         Home Layout: One level Home Equipment: Walker - 4 wheels      Prior Function Level of Independence: Needs assistance   Gait / Transfers Assistance Needed: modified indepenent with rollator  ADL's / Homemaking Assistance Needed: has aide daily for several hours; aide also helps her husband        Hand Dominance   Dominant Hand: Right    Extremity/Trunk Assessment   Upper Extremity Assessment Upper Extremity Assessment: Defer to OT evaluation RUE Deficits / Details: apparent RTC insufficiency at baseline but uses functionally RUE Coordination: decreased  gross motor LUE Deficits / Details: similar to R    Lower Extremity Assessment Lower Extremity Assessment: Generalized weakness (unable to assess R LE 2* pain)    Cervical / Trunk Assessment Cervical / Trunk Assessment: Kyphotic Cervical / Trunk Exceptions: unable to achieve full upright posture  Communication   Communication: HOH  Cognition Arousal/Alertness: Awake/alert Behavior During Therapy:  WFL for tasks assessed/performed Overall Cognitive Status: No family/caregiver present to determine baseline cognitive functioning                                 General Comments: most likley at baseline; apperant STM deficits      General Comments      Exercises     Assessment/Plan    PT Assessment Patient needs continued PT services  PT Problem List Decreased strength;Decreased range of motion;Decreased mobility;Decreased activity tolerance;Decreased balance;Decreased knowledge of use of DME;Pain       PT Treatment Interventions DME instruction;Gait training;Therapeutic activities;Therapeutic exercise;Patient/family education;Balance training;Functional mobility training    PT Goals (Current goals can be found in the Care Plan section)  Acute Rehab PT Goals Patient Stated Goal: to get better. less pain. PT Goal Formulation: With patient Time For Goal Achievement: 02/23/20 Potential to Achieve Goals: Good    Frequency Min 3X/week   Barriers to discharge Decreased caregiver support      Co-evaluation PT/OT/SLP Co-Evaluation/Treatment: Yes Reason for Co-Treatment: For patient/therapist safety;To address functional/ADL transfers PT goals addressed during session: Mobility/safety with mobility;Proper use of DME OT goals addressed during session: ADL's and self-care       AM-PAC PT "6 Clicks" Mobility  Outcome Measure Help needed turning from your back to your side while in a flat bed without using bedrails?: Total Help needed moving from lying on your back to sitting on the side of a flat bed without using bedrails?: Total Help needed moving to and from a bed to a chair (including a wheelchair)?: Total Help needed standing up from a chair using your arms (e.g., wheelchair or bedside chair)?: Total Help needed to walk in hospital room?: Total Help needed climbing 3-5 steps with a railing? : Total 6 Click Score: 6    End of Session Equipment Utilized  During Treatment: Gait belt Activity Tolerance: Patient limited by pain;Patient limited by fatigue Patient left: in chair;with call bell/phone within reach   PT Visit Diagnosis: Muscle weakness (generalized) (M62.81);Pain;History of falling (Z91.81);Other abnormalities of gait and mobility (R26.89) Pain - Right/Left: Right Pain - part of body: Leg    Time: 1011-1037 PT Time Calculation (min) (ACUTE ONLY): 26 min   Charges:   PT Evaluation $PT Eval Moderate Complexity: 1 Mod            Doreatha Massed, PT Acute Rehabilitation  Office: [phone] Pager: [phone]

## 2020-02-09 NOTE — Progress Notes (Signed)
Occupational Therapy Evaluation Patient Details Name: Ann Fletcher MRN: [id number] DOB: 1932-07-02 Today's Date: 02/09/2020    History of Present Illness 86 y.o. female with history of A fib not on anticoagulation due to GI bleed, diastolic CHF, s/p R TKA,  HTN, DM, CKD, hypothyroidism, nodal ablation s/p pacemaker who suffered a proximal femur fx s/pfall at home. Underwent IM Nail R femur 02/08/20. WBAT.   Clinical Impression   PTA, pt lived at home with her husband and had a PCA daily to assist she and her husband with ADL and IADL tasks. Pt with significant functional decline, requiring +2 Max A with use of Stedy to mobilize OOB to chair and Max A with LB ADL. Pt will benefit from rehab at SNF to maximize functional level of independence. Will follow acutely.     Follow Up Recommendations  SNF;Supervision/Assistance - 24 hour    Equipment Recommendations  Other (comment) (TBA)    Recommendations for Other Services       Precautions / Restrictions Precautions Precautions: Fall      Mobility Bed Mobility Overal bed mobility: Needs Assistance Bed Mobility: Supine to Sit     Supine to sit: Max assist;+2 for physical assistance     General bed mobility comments: unable to move RLE off bed; assist to transition trunk to EOB; appears limited primarily by LE weakness and pain    Transfers Overall transfer level: Needs assistance   Transfers: Sit to/from Stand Sit to Stand: Max assist;+2 physical assistance              Balance Overall balance assessment: History of Falls;Needs assistance   Sitting balance-Leahy Scale: Fair       Standing balance-Leahy Scale: Poor                             ADL either performed or assessed with clinical judgement   ADL Overall ADL's : Needs assistance/impaired Eating/Feeding: Modified independent   Grooming: Set up;Sitting   Upper Body Bathing: Set up;Sitting   Lower Body Bathing: Maximal assistance;Sit  to/from stand   Upper Body Dressing : Minimal assistance;Sitting   Lower Body Dressing: Maximal assistance;Sit to/from stand   Toilet Transfer: Maximal assistance;+2 for physical assistance (simulated)   Toileting- Clothing Manipulation and Hygiene: Maximal assistance       Functional mobility during ADLs: Maximal assistance;+2 for physical assistance;Cueing for safety;Cueing for sequencing (used [employer])       Vision         Perception     Praxis      Pertinent Vitals/Pain Pain Assessment: 0-10 Pain Score: 5  Pain Location: R hip Pain Descriptors / Indicators: Aching;Discomfort;Grimacing;Guarding;Moaning Pain Intervention(s): Limited activity within patient's tolerance;Premedicated before session;Repositioned     Hand Dominance Right   Extremity/Trunk Assessment Upper Extremity Assessment Upper Extremity Assessment: RUE deficits/detail;LUE deficits/detail;Generalized weakness RUE Deficits / Details: apparent RTC insufficiency at baseline but uses functionally RUE Coordination: decreased gross motor LUE Deficits / Details: similar to R   Lower Extremity Assessment Lower Extremity Assessment: Defer to PT evaluation   Cervical / Trunk Assessment Cervical / Trunk Assessment: Kyphotic;Other exceptions (forward head) Cervical / Trunk Exceptions: unable to achieve full upright posture   Communication Communication Communication: HOH (hears better in her L ear)   Cognition Arousal/Alertness: Awake/alert Behavior During Therapy: WFL for tasks assessed/performed Overall Cognitive Status: No family/caregiver present to determine baseline cognitive functioning  General Comments: no family available; most likley at baseline; apparnet STM deficits and slow processing   General Comments       Exercises     Shoulder Instructions      Home Living Family/patient expects to be discharged to:: Skilled nursing facility                                         Prior Functioning/Environment Level of Independence: Needs assistance  Gait / Transfers Assistance Needed: modified indepenent with rollator ADL's / Homemaking Assistance Needed: has aid daily for several hours; aid also helps her husband Communication / Swallowing Assistance Needed: HOH          OT Problem List: Decreased strength;Decreased range of motion;Decreased activity tolerance;Impaired balance (sitting and/or standing);Decreased coordination;Decreased safety awareness;Decreased knowledge of use of DME or AE;Obesity;Impaired UE functional use;Pain      OT Treatment/Interventions: Self-care/ADL training;Therapeutic exercise;Energy conservation;DME and/or AE instruction;Therapeutic activities;Patient/family education;Balance training    OT Goals(Current goals can be found in the care plan section) Acute Rehab OT Goals Patient Stated Goal: to get better OT Goal Formulation: With patient Time For Goal Achievement: 02/23/20 Potential to Achieve Goals: Good  OT Frequency: Min 2X/week   Barriers to D/C:            Co-evaluation PT/OT/SLP Co-Evaluation/Treatment: Yes Reason for Co-Treatment: For patient/therapist safety;To address functional/ADL transfers   OT goals addressed during session: ADL's and self-care      AM-PAC OT "6 Clicks" Daily Activity     Outcome Measure Help from another person eating meals?: None Help from another person taking care of personal grooming?: A Little Help from another person toileting, which includes using toliet, bedpan, or urinal?: A Lot Help from another person bathing (including washing, rinsing, drying)?: A Lot Help from another person to put on and taking off regular upper body clothing?: A Little Help from another person to put on and taking off regular lower body clothing?: A Lot 6 Click Score: 16   End of Session Equipment Utilized During Treatment: Gait belt;Rolling walker Nurse  Communication: Mobility status;Need for lift equipment  Activity Tolerance: Patient tolerated treatment well Patient left: in chair;with call bell/phone within reach;with chair alarm set  OT Visit Diagnosis: Other abnormalities of gait and mobility (R26.89);Muscle weakness (generalized) (M62.81);History of falling (Z91.81);Pain Pain - Right/Left: Right Pain - part of body: Hip                Time: 6015-6153 OT Time Calculation (min): 27 min Charges:  OT General Charges $OT Visit: 1 Visit OT Evaluation $OT Eval Moderate Complexity: [place], OT/L   Acute OT Clinical Specialist [place] Pager [phone] Office [phone]   [facility] 02/09/2020, 12:28 PM

## 2020-02-09 NOTE — NC FL2 (Signed)
[place] LEVEL OF CARE SCREENING TOOL     IDENTIFICATION  Patient Name: Ann Fletcher Birthdate: 02-May-1932 Sex: female Admission Date (Current Location): 02/07/2020  [facility] and [place] Number:  Herbalist and Address:  [facility],  [place] [address] [place], [place]      Provider Number: [id number]  Attending Physician Name and Address:  Tawni Millers  Relative Name and Phone Number:  daughter, Jennings Books @ [phone]    Current Level of Care: Hospital Recommended Level of Care: [place] Prior Approval Number:    Date Approved/Denied:   PASRR Number: [id number]  Discharge Plan: SNF    Current Diagnoses: Patient Active Problem List   Diagnosis Date Noted  . Hip fracture ([place]) 02/08/2020  . Fracture, proximal femur, right, closed, initial encounter ([place]) 02/07/2020  . Complete heart block ([place]) 12/17/2019  . CRI (chronic renal insufficiency), stage 4 (severe) ([place]) 04/16/2019  . Persistent atrial fibrillation ([place]) 12/02/2018  . Pacemaker 12/02/2018  . 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 Anemia 11/19/2017  . Anorexia 11/19/2017  . Interstitial lung disease ([place]) 11/07/2017  . Iron deficiency anemia   . Acute on chronic diastolic CHF (congestive heart failure) ([place])   . Status post atrioventricular nodal ablation 08/24/2017  . Chronic diastolic CHF (congestive heart failure) ([place]) 08/24/2017  . Type 2 diabetes mellitus with renal manifestations, controlled ([place]) 03/24/2015  . Essential hypertension 02/17/2014  . Hyperlipidemia 02/17/2014  . Lower extremity edema 02/17/2014  . Right carotid bruit 02/17/2014    Orientation RESPIRATION BLADDER Height & Weight     Self, Time, Situation, Place  Normal Continent Weight: 147 lb 11.3 oz (67 kg) Height:  5\' 3"  (160 cm)  BEHAVIORAL SYMPTOMS/MOOD NEUROLOGICAL BOWEL NUTRITION STATUS      Continent    AMBULATORY STATUS COMMUNICATION OF  NEEDS Skin   Extensive Assist   Surgical wounds                       Personal Care Assistance Level of Assistance  Bathing, Dressing Bathing Assistance: Limited assistance   Dressing Assistance: Limited assistance     Functional Limitations Info             SPECIAL CARE FACTORS FREQUENCY  PT (By licensed PT), OT (By licensed OT)     PT Frequency: 5x/wk OT Frequency: 5x/wk            Contractures Contractures Info: Not present    Additional Factors Info  Code Status, Allergies Code Status Info: Full Allergies Info: see MAR           Current Medications (02/09/2020):  This is the current hospital active medication list Current Facility-Administered Medications  Medication Dose Route Frequency Provider Last Rate Last Admin  . albuterol (VENTOLIN HFA) 108 (90 Base) MCG/ACT inhaler 2 puff  2 puff Inhalation TID PRN McBane, Maylene Roes, PA-C      . atorvastatin (LIPITOR) tablet 40 mg  40 mg Oral QHS Ethelda Chick, PA-C   40 mg at 02/08/20 2236  . bisacodyl (DULCOLAX) EC tablet 5 mg  5 mg Oral Daily PRN McBane, Maylene Roes, PA-C      . docusate sodium (COLACE) capsule 100 mg  100 mg Oral BID Ethelda Chick, PA-C   100 mg at 02/09/20 0813  . enoxaparin (LOVENOX) injection 30 mg  30 mg Subcutaneous Q24H Ethelda Chick, PA-C   30 mg at 02/09/20 0815  .  HYDROcodone-acetaminophen (NORCO) 7.5-325 MG per tablet 1 tablet  1 tablet Oral Q4H PRN McBane, Maylene Roes, PA-C      . HYDROcodone-acetaminophen (NORCO/VICODIN) 5-325 MG per tablet 1 tablet  1 tablet Oral Q4H PRN Ethelda Chick, PA-C   1 tablet at 02/09/20 0927  . insulin aspart (novoLOG) injection 0-9 Units  0-9 Units Subcutaneous TID AC & HS Lang Snow, FNP   5 Units at 02/09/20 1305  . levothyroxine (SYNTHROID) tablet 100 mcg  100 mcg Oral QAC breakfast Ethelda Chick, PA-C   100 mcg at 02/09/20 0762  . LORazepam (ATIVAN) tablet 1 mg  1 mg Oral TID PRN McBane, Maylene Roes, PA-C      .  menthol-cetylpyridinium (CEPACOL) lozenge 3 mg  1 lozenge Oral PRN McBane, Maylene Roes, PA-C       Or  . phenol (CHLORASEPTIC) mouth spray 1 spray  1 spray Mouth/Throat PRN McBane, Caroline N, PA-C      . morphine 2 MG/ML injection 0.5 mg  0.5 mg Intravenous Q2H PRN McBane, Caroline N, PA-C      . ondansetron (ZOFRAN) tablet 4 mg  4 mg Oral Q6H PRN McBane, Maylene Roes, PA-C       Or  . ondansetron (ZOFRAN) injection 4 mg  4 mg Intravenous Q6H PRN Ethelda Chick, PA-C   4 mg at 02/09/20 1358  . polyethylene glycol (MIRALAX / GLYCOLAX) packet 17 g  17 g Oral Daily PRN Ethelda Chick, PA-C         Discharge Medications: Please see discharge summary for a list of discharge medications.  Relevant Imaging Results:  Relevant Lab Results:   Additional Information SS# [id number]  Lennart Pall, LCSW

## 2020-02-09 NOTE — TOC Initial Note (Signed)
Transition of Care [facility]) - Initial/Assessment Note    Patient Details  Name: Ann Fletcher MRN: [id number] Date of Birth: [birth year]  Transition [facility]) CM/SW Contact:    Ann Pall, LCSW Phone Number: 02/09/2020, 2:13 PM  Clinical Narrative:                 Met with pt this morning to review dc plans.  Pt aware of recommendation for SNF rehab and she is agreeable.  Have spoken with daughter, Ann Fletcher, who is also aware and agreed.  She notes that pt's spouse cannot meet her current assist needs.   Have begun insurance auth (ref # [id number]) and FL2 sent to area facilities.  TOC will continue to follow.  Expected Discharge Plan: Skilled Nursing Facility Barriers to Discharge: Continued Medical Work up   Patient Goals and CMS Choice Patient states their goals for this hospitalization and ongoing recovery are:: to eventually return home      Expected Discharge Plan and Services Expected Discharge Plan: [place] In-house Referral: Clinical Social Work     Living arrangements for the past 2 months: Single Family Home                 DME Arranged: N/A DME Agency: NA       HH Arranged: NA [place] Agency: NA        Prior Living Arrangements/Services Living arrangements for the past 2 months: [place] Lives with:: Spouse Patient language and need for interpreter reviewed:: Yes Do you feel safe going back to the place where you live?: Yes      Need for Family Participation in Patient Care: Yes (Comment) Care giver support system in place?: No (comment) Current home services: Homehealth aide Criminal Activity/Legal Involvement Pertinent to Current Situation/Hospitalization: No - Comment as needed  Activities of Daily Living Home Assistive Devices/Equipment: Walker (specify type) ADL Screening (condition at time of admission) Patient's cognitive ability adequate to safely complete daily activities?: No Is the patient deaf or have difficulty  hearing?: Yes Does the patient have difficulty seeing, even when wearing glasses/contacts?: No Does the patient have difficulty concentrating, remembering, or making decisions?: No Patient able to express need for assistance with ADLs?: Yes Does the patient have difficulty dressing or bathing?: Yes Independently performs ADLs?: No Communication: Independent Dressing (OT): Needs assistance Is this a change from baseline?: Pre-admission baseline Grooming: Independent Feeding: Independent Bathing: Independent Toileting: Independent In/Out Bed: Independent Walks in Home: Independent Does the patient have difficulty walking or climbing stairs?: Yes Weakness of Legs: Both Weakness of Arms/Hands: None  Permission Sought/Granted Permission sought to share information with : Family Supports Permission granted to share information with : Yes, Verbal Permission Granted  Share Information with NAME: daughter, Ann Fletcher @ [phone]           Emotional Assessment Appearance:: Appears stated age Attitude/Demeanor/Rapport: Gracious Affect (typically observed): Accepting, Pleasant, Quiet Orientation: : Oriented to Self, Oriented to Place, Oriented to  Time, Oriented to Situation Alcohol / Substance Use: Not Applicable Psych Involvement: No (comment)  Admission diagnosis:  Hip fracture ([place]) [S72.009A] Fall [W19.XXXA] Fracture, proximal femur, right, closed, initial encounter [facility]) [S72.001A] Patient Active Problem List   Diagnosis Date Noted  . Hip fracture ([place]) 02/08/2020  . Fracture, proximal femur, right, closed, initial encounter ([place]) 02/07/2020  . Complete heart block ([place]) 12/17/2019  . CRI (chronic renal insufficiency), stage 4 (severe) ([place]) 04/16/2019  . Persistent atrial fibrillation ([place]) 12/02/2018  . Pacemaker 12/02/2018  .  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 Anemia 11/19/2017  . Anorexia 11/19/2017  . Interstitial lung disease ([place]) 11/07/2017  .  Iron deficiency anemia   . Acute on chronic diastolic CHF (congestive heart failure) ([place])   . Status post atrioventricular nodal ablation 08/24/2017  . Chronic diastolic CHF (congestive heart failure) ([place]) 08/24/2017  . Type 2 diabetes mellitus with renal manifestations, controlled ([place]) 03/24/2015  . Essential hypertension 02/17/2014  . Hyperlipidemia 02/17/2014  . Lower extremity edema 02/17/2014  . Right carotid bruit 02/17/2014   PCP:  [name], MD Pharmacy:   [place], [place] - [address] [place] [address] Phone: [phone] Fax: [phone]     Social Determinants of Health ([place]) Interventions    Readmission Risk Interventions Readmission Risk Prevention Plan 02/09/2020  Transportation Screening Complete  PCP or Specialist Appt within 5-7 Days Complete  Medication Review (RN CM) Complete  Some recent data might be hidden

## 2020-02-10 LAB — BASIC METABOLIC PANEL
Anion gap: 7 (ref 5–15)
BUN: 43 mg/dL — ABNORMAL HIGH (ref 8–23)
CO2: 25 mmol/L (ref 22–32)
Calcium: 8.4 mg/dL — ABNORMAL LOW (ref 8.9–10.3)
Chloride: 100 mmol/L (ref 98–111)
Creatinine, Ser: 1.87 mg/dL — ABNORMAL HIGH (ref 0.44–1.00)
GFR, Estimated: 26 mL/min — ABNORMAL LOW (ref >60)
Glucose, Bld: 175 mg/dL — ABNORMAL HIGH (ref 70–99)
Potassium: 4.6 mmol/L (ref 3.5–5.1)
Sodium: 132 mmol/L — ABNORMAL LOW (ref 135–145)

## 2020-02-10 LAB — CBC WITH DIFFERENTIAL/PLATELET
Abs Immature Granulocytes: 0.09 10*3/uL — ABNORMAL HIGH (ref 0.00–0.07)
Basophils Absolute: 0.1 10*3/uL (ref 0.0–0.1)
Basophils Relative: 0 %
Eosinophils Absolute: 0.1 10*3/uL (ref 0.0–0.5)
Eosinophils Relative: 1 %
HCT: 25.5 % — ABNORMAL LOW (ref 36.0–46.0)
Hemoglobin: 8.1 g/dL — ABNORMAL LOW (ref 12.0–15.0)
Immature Granulocytes: 1 %
Lymphocytes Relative: 15 %
Lymphs Abs: 2.3 10*3/uL (ref 0.7–4.0)
MCH: 29.5 pg (ref 26.0–34.0)
MCHC: 31.8 g/dL (ref 30.0–36.0)
MCV: 92.7 fL (ref 80.0–100.0)
Monocytes Absolute: 1.6 10*3/uL — ABNORMAL HIGH (ref 0.1–1.0)
Monocytes Relative: 10 %
Neutro Abs: 11.6 10*3/uL — ABNORMAL HIGH (ref 1.7–7.7)
Neutrophils Relative %: 73 %
Platelets: 155 10*3/uL (ref 150–400)
RBC: 2.75 MIL/uL — ABNORMAL LOW (ref 3.87–5.11)
RDW: 12.8 % (ref 11.5–15.5)
WBC: 15.8 10*3/uL — ABNORMAL HIGH (ref 4.0–10.5)
nRBC: 0 % (ref 0.0–0.2)

## 2020-02-10 LAB — GLUCOSE, CAPILLARY
Glucose-Capillary: 184 mg/dL — ABNORMAL HIGH (ref 70–99)
Glucose-Capillary: 244 mg/dL — ABNORMAL HIGH (ref 70–99)
Glucose-Capillary: 174 mg/dL — ABNORMAL HIGH (ref 70–99)
Glucose-Capillary: 205 mg/dL — ABNORMAL HIGH (ref 70–99)

## 2020-02-10 LAB — URINALYSIS, ROUTINE W REFLEX MICROSCOPIC
Bilirubin Urine: NEGATIVE
Glucose, UA: NEGATIVE mg/dL
Hgb urine dipstick: NEGATIVE
Ketones, ur: NEGATIVE mg/dL
Leukocytes,Ua: NEGATIVE
Nitrite: NEGATIVE
Protein, ur: NEGATIVE mg/dL
Specific Gravity, Urine: 1.015 (ref 1.005–1.030)
pH: 5 (ref 5.0–8.0)

## 2020-02-10 LAB — RESPIRATORY PANEL BY RT PCR (FLU A&B, COVID)
Influenza A by PCR: NEGATIVE
Influenza B by PCR: NEGATIVE
SARS Coronavirus 2 by RT PCR: NEGATIVE

## 2020-02-10 MED ORDER — SODIUM CHLORIDE 0.9 % IV BOLUS
500.0000 mL | Freq: Once | INTRAVENOUS | Status: AC
  Administered 2020-02-10: 500 mL via INTRAVENOUS

## 2020-02-10 MED ORDER — HYDROCODONE-ACETAMINOPHEN 5-325 MG PO TABS
0.5000 | ORAL_TABLET | Freq: Four times a day (QID) | ORAL | 0 refills | Status: DC | PRN
Start: 2020-02-10 — End: 2020-03-11

## 2020-02-10 MED ORDER — ENOXAPARIN SODIUM 30 MG/0.3ML ~~LOC~~ SOLN: 30.0000 mg | SUBCUTANEOUS | 0 refills | Status: DC

## 2020-02-10 MED ORDER — SODIUM CHLORIDE 0.9 % IV SOLN: INTRAVENOUS | Status: DC

## 2020-02-10 NOTE — Discharge Instructions (Signed)
°  Ophelia Charter MD, MPH Noemi Chapel, PA-C [place] [address] [phone] (tel)   [phone] (fax)   [place] - Please keep dressing intact until followup.  - You may shower on Post-Op Day #2.  - The dressing is water resistant but do not scrub it as it may start to peel up.  - Gently pat the area dry.  - Do not soak the shoulder in water. Do not go swimming in the pool or ocean until your sutures are removed. - KEEP THE INCISIONS CLEAN AND DRY.  EXERCISES - Follow the instructions of your therapist.  No specific exercises necessary outside of this. - You may bear weight on your operative leg. - Please continue to ambulate and do not stay sitting or lying for too long. Perform foot and wrist pumps to assist in circulation.  POST-OP MEDICATIONS- Multimodal approach to pain control  In general your pain will be controlled with a combination of substances.  Prescriptions unless otherwise discussed are electronically sent to your pharmacy.  This is a carefully made plan we use to minimize narcotic use.      ? Acetaminophen - Non-narcotic pain medicine taken on a scheduled basis  ? Your pain medication has Tylenol in it, so do not take more than 3500 mg of Tylenol daily! ? Norco (hydrocodone-acetaminophen) - This is a strong narcotic, to be used only on an as needed basis for pain. ? Lovenox  - This medicine is used to minimize the risk of blood clots after surgery.  FOLLOW-UP - If you develop a Fever (>101.5), Redness or Drainage from the surgical incision site, please call our office to arrange for an evaluation. - Please call the office to schedule a follow-up appointment for your incision check, 10-14 days post-operatively.  IF YOU HAVE ANY QUESTIONS, PLEASE FEEL FREE TO CALL OUR OFFICE.  HELPFUL INFORMATION  You should wean off your narcotic medicines as soon as you are able.  Most patients will  be off or using minimal narcotics before their first postop appointment.   We suggest you use the pain medication the first night prior to going to bed, in order to ease any pain when the anesthesia wears off. You should avoid taking pain medications on an empty stomach as it will make you nauseous.  Do not drink alcoholic beverages or take illicit drugs when taking pain medications.  Pain medication may make you constipated.  Below are a few solutions to try in this order: Decrease the amount of pain medication if you aren't having pain. Drink lots of decaffeinated fluids. Drink prune juice and/or each dried prunes  If the first 3 don't work start with additional solutions Take Colace - an over-the-counter stool softener Take Senokot - an over-the-counter laxative Take Miralax - a stronger over-the-counter laxative

## 2020-02-10 NOTE — Care Management Important Message (Signed)
Important Message  Patient Details IM Letter given to the Patient Name: Ann Fletcher MRN: [id number] Date of Birth: 03-04-1933   Medicare Important Message Given:  Yes     Kerin Salen 02/10/2020, 10:08 AM

## 2020-02-10 NOTE — Progress Notes (Signed)
Patient's sats 88% room air. Placed on 2 liters [place], sats 97%.

## 2020-02-10 NOTE — Progress Notes (Signed)
Patient unable to urinate. In & out catheter yielded 400 mL.

## 2020-02-10 NOTE — Progress Notes (Signed)
Patient alert laying with head raised in bed drinking cold water.  When asked if in any pain patient could not state pain level 0-10.  Patient grimaced slightly when adjusting herself in bed and stated her right heel hurts a little bit.  Offered repositioning, patient declined.  Normal Saline infusion started to IV in right hand as ordered.  IV flushed and is clean, patent, and intact.  Patient tolerated well.

## 2020-02-10 NOTE — TOC Progression Note (Addendum)
Transition of Care [facility] - Progression Note    Patient Details  Name: Ann Fletcher MRN: [id number] Date of Birth: 1932-10-25  Transition of Care [facility]) CM/SW [place], LCSW Phone Number: 02/10/2020, 9:42 AM  Clinical Narrative:    CSW discussed bed offers with the patient and family member [name]. The patient and Watt Climes agreeable to [facility].  CS[facility]   [facility] reference #[id number].Details provided to SNF.     Expected Discharge Plan: Skilled Nursing Facility Barriers to Discharge: Other (comment), Continued Medical Work up (covid test)  Expected Discharge Plan and Services Expected Discharge Plan: [place] In-house Referral: Clinical Social Work     Living arrangements for the past 2 months: Single Family Home                 DME Arranged: N/A DME Agency: NA       HH Arranged: NA [place] Agency: NA         Social Determinants of Health (SDOH) Interventions    Readmission Risk Interventions Readmission Risk Prevention Plan 02/09/2020  Transportation Screening Complete  PCP or Specialist Appt within 5-7 Days Complete  Medication Review (RN CM) Complete  Some recent data might be hidden

## 2020-02-10 NOTE — Progress Notes (Signed)
   ORTHOPAEDIC PROGRESS NOTE  s/p Procedure(s): INTRAMEDULLARY (IM) NAIL INTERTROCHANTRIC  SUBJECTIVE: Reports soreness in her right hip. She states her mouth is dry. Did not eat much of her breakfast this morning. Patient was placed on nasal cannula earlier this AM, but had normal O2 sats on room air when I saw her. She had no complaints.   OBJECTIVE: PE: General: resting in hospital bed, NAD Pulm: no increased work of breathing Cardiac: regular rate Right lower extremity: dressings CDI, tender to palpation right hip, intact EHL/TA/GSC, endorses distal sensation, SCD in place, warm well perfused foot   Vitals:   02/10/20 0628 02/10/20 0800  BP:  (!) 137/46  Pulse:  70  Resp:  16  Temp:  98.8 F (37.1 C)  SpO2: 97% 97%     ASSESSMENT: Ann Fletcher is a 86 y.o. female POD#2  PLAN: Weightbearing: WBAT RLE Insicional and dressing care: Reinforce dressings as needed Orthopedic device(s): None Showering: Post-op day #2 with assistance VTE prophylaxis: Lovenox 30 mg QD Pain control: PRN pain medications, preferring oral medications ABLA: Hgb 8.1. Hemodynamically stable. Continue to monitor.  Follow - up plan: 2 weeks in office for incision check and XR.  Contact information:  Weekdays 8-5 Noemi Chapel PA-C [phone], After hours and holidays please check [email or web address] for group call information for [facility]  Dispo: PT/OT recommending SNF. TOC following. Patient agreeable to [facility]. Pain medication and DVT prophylaxis printed and placed in patient's chart.    Noemi Chapel, PA-C 02/10/2020

## 2020-02-10 NOTE — Progress Notes (Signed)
Brief cardiology note  No acute events post op. Cardiology will sign off. Please call with questions. Buford Dresser, MD, PhD [facility]  [address] [place], [place] [address] [phone]

## 2020-02-10 NOTE — Progress Notes (Signed)
Patient sitting up slightly in bed upon entering the room watching television and drinking water.  Vitals checked and stable, meds given and tolerated well.  Patient states pain level of 0 on 0-10 pain scale.  Encouraged patient to eat a portion of her breakfast meal, approximately 25% consumed.  Offered to insert dentures prior to eating breakfast, patient declined.  Patient continued to drink water and a small amount of grape juice.  Dressings to incision sites and IV sites clean, dry, and intact.

## 2020-02-10 NOTE — Progress Notes (Signed)
PROGRESS NOTE    KALLEY NICHOLL  JJK:[id number] DOB: [birth year] DOA: 02/07/2020 PCP: [name], MD    Brief Narrative:  86 year old female with history of A. fib, AV node ablation is status post pacemaker, diastolic heart failure, hypertension, type 2 diabetes, CKD stage IIIa and hypothyroidism.  Admitted to the hospital with mechanical fall and right hip pain.  She was found to have right hip fracture.   Assessment & Plan:   Principal Problem:   Fracture, proximal femur, right, closed, initial encounter [facility]) Active Problems:   Essential hypertension   Type 2 diabetes mellitus with renal manifestations, controlled ([place])   Chronic diastolic CHF (congestive heart failure) (HCC)   Interstitial lung disease ([place])   Pacemaker   CRI (chronic renal insufficiency), stage 4 (severe) (HCC)   Hip fracture (HCC)  Right hip fracture: Status post IM nail on 11/8.  Surgically stable as per surgery. -Weightbearing as tolerated -Oxycodone and Tylenol for pain relief along with laxatives -Lovenox 30 mg every day for DVT prophylaxis -Inpatient PT OT at a skilled nursing facility, plan transfer for tomorrow pending medical stability.  Type 2 diabetes uncontrolled with hyperglycemia/dyslipidemia associated with type 2 diabetes: Currently on insulin coverage.  Currently stable.  Acute kidney injury on chronic kidney disease stage IIIa: Her creatinine fluctuates.  Clinically dehydrated with increased creatinine today.  Patient with low urine output. Will bolus 500 cc IV fluid, keep on maintenance IV fluids overnight.  Leukocytosis: Probably reactive.  No evidence of active infection.  Will check her urine analysis and urine culture.  Hypothyroidism: Stable on Synthroid.  A. fib, paroxysmal A. fib: Rate controlled.  Pacemaker in place.  Not on anticoagulation.  Anemia with expected blood loss from long bone fractures and surgery: We will closely monitor.  Currently no indication for  transfusion.   DVT prophylaxis: enoxaparin (LOVENOX) injection 30 mg Start: 02/09/20 0800 SCDs Start: 02/08/20 1902 SCDs Start: 02/08/20 0110   Code Status: Full code Family Communication: None.  Left message to patient's daughter. Disposition Plan: Status is: Inpatient  Remains inpatient appropriate because:Unsafe d/c plan and Inpatient level of care appropriate due to severity of illness   Dispo: The patient is from: Home              Anticipated d/c is to: SNF              Anticipated d/c date is: 2 days              Patient currently is not medically stable to d/c.         Consultants:   Orthopedics  Cardiology  Procedures:   IM nail right femur, 11/8  Antimicrobials:   None   Subjective: Patient seen and examined.  Overnight events noted.  She was not able to urinate after removal of Foley catheter and total 400 mL urine output overnight.  Feels dry.  Denies any nausea vomiting.  Afebrile.  Objective: Vitals:   02/10/20 0535 02/10/20 0628 02/10/20 0800 02/10/20 1300  BP: (!) 148/47  (!) 137/46 107/71  Pulse: 69  70 69  Resp: 16  16 20   Temp: 99.5 F (37.5 C)  98.8 F (37.1 C) 98.3 F (36.8 C)  TempSrc: Oral  Oral Oral  SpO2: 92% 97% 97% 96%  Weight:      Height:        Intake/Output Summary (Last 24 hours) at 02/10/2020 1527 Last data filed at 02/10/2020 1500 Gross per 24 hour  Intake 660 ml  Output 550 ml  Net 110 ml   Filed Weights   02/08/20 1900  Weight: 67 kg    Examination:  General exam: Appears calm and comfortable  Slightly anxious with flat affect. Respiratory system: Clear to auscultation. Respiratory effort normal. Cardiovascular system: S1 & S2 heard, regular. Gastrointestinal system: Soft and nontender.  Bowel sounds present.. Central nervous system: Alert and oriented. No focal neurological deficits. Extremities: Symmetric 5 x 5 power. Right hip incision clean and dry.     Data Reviewed: I have personally  reviewed following labs and imaging studies  CBC: Recent Labs  Lab 02/07/20 2320 02/08/20 0500 02/09/20 0320 02/10/20 0315  WBC 14.3* 12.4* 10.7* 15.8*  NEUTROABS 12.2*  --   --  11.6*  HGB 11.4* 10.0* 8.0* 8.1*  HCT 37.0 31.5* 25.4* 25.5*  MCV 93.4 91.6 94.4 92.7  PLT 174 174 139* 161   Basic Metabolic Panel: Recent Labs  Lab 02/07/20 2320 02/08/20 0500 02/09/20 0320 02/10/20 0315  NA 137 140 136 132*  K 4.9 4.8 4.3 4.6  CL 101 105 103 100  CO2 25 26 24 25   GLUCOSE 289* 184* 158* 175*  BUN 41* 37* 37* 43*  CREATININE 1.42* 1.39* 1.51* 1.87*  CALCIUM 9.3 9.1 8.6* 8.4*   GFR: Estimated Creatinine Clearance: 20.2 mL/min (A) (by C-G formula based on SCr of 1.87 mg/dL (H)). Liver Function Tests: No results for input(s): AST, ALT, ALKPHOS, BILITOT, PROT, ALBUMIN in the last 168 hours. No results for input(s): LIPASE, AMYLASE in the last 168 hours. No results for input(s): AMMONIA in the last 168 hours. Coagulation Profile: Recent Labs  Lab 02/07/20 2320  INR 1.0   Cardiac Enzymes: No results for input(s): CKTOTAL, CKMB, CKMBINDEX, TROPONINI in the last 168 hours. BNP (last 3 results) No results for input(s): PROBNP in the last 8760 hours. HbA1C: No results for input(s): HGBA1C in the last 72 hours. CBG: Recent Labs  Lab 02/09/20 1630 02/09/20 2133 02/09/20 2319 02/10/20 0732 02/10/20 1145  GLUCAP 191* 185* 217* 184* 244*   Lipid Profile: No results for input(s): CHOL, HDL, LDLCALC, TRIG, CHOLHDL, LDLDIRECT in the last 72 hours. Thyroid Function Tests: No results for input(s): TSH, T4TOTAL, FREET4, T3FREE, THYROIDAB in the last 72 hours. Anemia Panel: No results for input(s): VITAMINB12, FOLATE, FERRITIN, TIBC, IRON, RETICCTPCT in the last 72 hours. Sepsis Labs: No results for input(s): PROCALCITON, LATICACIDVEN in the last 168 hours.  Recent Results (from the past 240 hour(s))  Respiratory Panel by RT PCR (Flu A&B, Covid) - Nasopharyngeal Swab      Status: None   Collection Time: 02/07/20 11:20 PM   Specimen: Nasopharyngeal Swab  Result Value Ref Range Status   SARS Coronavirus 2 by RT PCR NEGATIVE NEGATIVE Final    Comment: (NOTE) SARS-CoV-2 target nucleic acids are NOT DETECTED.  The SARS-CoV-2 RNA is generally detectable in upper respiratoy specimens during the acute phase of infection. The lowest concentration of SARS-CoV-2 viral copies this assay can detect is 131 copies/mL. A negative result does not preclude SARS-Cov-2 infection and should not be used as the sole basis for treatment or other patient management decisions. A negative result may occur with  improper specimen collection/handling, submission of specimen other than nasopharyngeal swab, presence of viral mutation(s) within the areas targeted by this assay, and inadequate number of viral copies (<131 copies/mL). A negative result must be combined with clinical observations, patient history, and epidemiological information. The expected result is Negative.  Fact Sheet for Patients:  PinkCheek.be  Fact Sheet for Healthcare Providers:  GravelBags.it  This test is no t yet approved or cleared by the [place] FDA and  has been authorized for detection and/or diagnosis of SARS-CoV-2 by FDA under an Emergency Use Authorization (EUA). This EUA will remain  in effect (meaning this test can be used) for the duration of the COVID-19 declaration under Section 564(b)(1) of the Act, 21 U.S.C. section 360bbb-3(b)(1), unless the authorization is terminated or revoked sooner.     Influenza A by PCR NEGATIVE NEGATIVE Final   Influenza B by PCR NEGATIVE NEGATIVE Final    Comment: (NOTE) The Xpert Xpress SARS-CoV-2/FLU/RSV assay is intended as an aid in  the diagnosis of influenza from Nasopharyngeal swab specimens and  should not be used as a sole basis for treatment. Nasal washings and  aspirates are  unacceptable for Xpert Xpress SARS-CoV-2/FLU/RSV  testing.  Fact Sheet for Patients: PinkCheek.be  Fact Sheet for Healthcare Providers: GravelBags.it  This test is not yet approved or cleared by the [place] FDA and  has been authorized for detection and/or diagnosis of SARS-CoV-2 by  FDA under an Emergency Use Authorization (EUA). This EUA will remain  in effect (meaning this test can be used) for the duration of the  Covid-19 declaration under Section 564(b)(1) of the Act, 21  U.S.C. section 360bbb-3(b)(1), unless the authorization is  terminated or revoked. Performed at [facility], [place] [address] [place], [place] [address]   Surgical pcr screen     Status: None   Collection Time: 02/08/20  2:09 PM   Specimen: Nasal Mucosa; Nasal Swab  Result Value Ref Range Status   MRSA, PCR NEGATIVE NEGATIVE Final   Staphylococcus aureus NEGATIVE NEGATIVE Final    Comment: (NOTE) The Xpert SA Assay (FDA approved for NASAL specimens in patients 67 years of age and older), is one component of a comprehensive surveillance program. It is not intended to diagnose infection nor to guide or monitor treatment. Performed at [facility], [place] [address], [place], [place] [address]   Respiratory Panel by RT PCR (Flu A&B, Covid) - Nasopharyngeal Swab     Status: None   Collection Time: 02/10/20 10:53 AM   Specimen: Nasopharyngeal Swab  Result Value Ref Range Status   SARS Coronavirus 2 by RT PCR NEGATIVE NEGATIVE Final    Comment: (NOTE) SARS-CoV-2 target nucleic acids are NOT DETECTED.  The SARS-CoV-2 RNA is generally detectable in upper respiratoy specimens during the acute phase of infection. The lowest concentration of SARS-CoV-2 viral copies this assay can detect is 131 copies/mL. A negative result does not preclude SARS-Cov-2 infection and should not be used as the sole basis for treatment or other  patient management decisions. A negative result may occur with  improper specimen collection/handling, submission of specimen other than nasopharyngeal swab, presence of viral mutation(s) within the areas targeted by this assay, and inadequate number of viral copies (<131 copies/mL). A negative result must be combined with clinical observations, patient history, and epidemiological information. The expected result is Negative.  Fact Sheet for Patients:  PinkCheek.be  Fact Sheet for Healthcare Providers:  GravelBags.it  This test is no t yet approved or cleared by the [place] FDA and  has been authorized for detection and/or diagnosis of SARS-CoV-2 by FDA under an Emergency Use Authorization (EUA). This EUA will remain  in effect (meaning this test can be used) for the duration of the COVID-19 declaration under Section 564(b)(1) of the Act,  21 U.S.C. section 360bbb-3(b)(1), unless the authorization is terminated or revoked sooner.     Influenza A by PCR NEGATIVE NEGATIVE Final   Influenza B by PCR NEGATIVE NEGATIVE Final    Comment: (NOTE) The Xpert Xpress SARS-CoV-2/FLU/RSV assay is intended as an aid in  the diagnosis of influenza from Nasopharyngeal swab specimens and  should not be used as a sole basis for treatment. Nasal washings and  aspirates are unacceptable for Xpert Xpress SARS-CoV-2/FLU/RSV  testing.  Fact Sheet for Patients: PinkCheek.be  Fact Sheet for Healthcare Providers: GravelBags.it  This test is not yet approved or cleared by the [place] FDA and  has been authorized for detection and/or diagnosis of SARS-CoV-2 by  FDA under an Emergency Use Authorization (EUA). This EUA will remain  in effect (meaning this test can be used) for the duration of the  Covid-19 declaration under Section 564(b)(1) of the Act, 21  U.S.C. section  360bbb-3(b)(1), unless the authorization is  terminated or revoked. Performed at [facility], [place] [address], [place], [place] [address]          Radiology Studies: DG C-Arm 1-60 Min-No Report  Result Date: 02/08/2020 Fluoroscopy was utilized by the requesting physician.  No radiographic interpretation.   DG Hip Port Unilat With Pelvis 1V Right  Result Date: 02/08/2020 CLINICAL DATA:  IM nail fixation EXAM: DG HIP (WITH OR WITHOUT PELVIS) 1V PORT RIGHT COMPARISON:  Radiograph same day FINDINGS: The patient is status post ORIF with intramedullary rod and screw fixation of the proximal right femoral fracture. There is mildly displaced lesser trochanter. No change in alignment. No new fracture seen. Overlying soft tissue swelling. IMPRESSION: Status post ORIF with IM nail fixation of proximal femur fracture. No acute hardware complication. Electronically Signed   By: Prudencio Pair M.D.   On: 02/08/2020 18:11   DG FEMUR, MIN 2 VIEWS RIGHT  Result Date: 02/08/2020 CLINICAL DATA:  ORIF right femur. EXAM: RIGHT FEMUR 2 VIEWS COMPARISON:  None. FINDINGS: 4 intraoperative spot fluoro films show antegrade femoral IM nail with interlocking dynamic femoral neck screw and 2 distal interlocking screws. IM nail transfixes a proximal femur fracture. No evidence for immediate hardware complication. IMPRESSION: Intraoperative assessment during ORIF for proximal femur fracture. Electronically Signed   By: Misty Stanley M.D.   On: 02/08/2020 17:53        Scheduled Meds: . atorvastatin  40 mg Oral QHS  . docusate sodium  100 mg Oral BID  . enoxaparin (LOVENOX) injection  30 mg Subcutaneous Q24H  . feeding supplement (GLUCERNA SHAKE)  237 mL Oral BID BM  . insulin aspart  0-9 Units Subcutaneous TID AC & HS  . levothyroxine  100 mcg Oral QAC breakfast   Continuous Infusions: . sodium chloride 75 mL/hr at 02/10/20 1332  . sodium chloride       LOS: 3 days    Time spent: 25  minutes    Barb Merino, MD [facility] Pager [phone]

## 2020-02-10 NOTE — Progress Notes (Signed)
Patient is now stating pain to her right leg pain is feeling better but could not state pain level of 0-10.  Patient is not showing any facial grimacing or guarding the area.

## 2020-02-10 NOTE — Anesthesia Postprocedure Evaluation (Signed)
Anesthesia Post Note  Patient: Ann PEREZGARCIA  Procedure(s) Performed: INTRAMEDULLARY (IM) NAIL INTERTROCHANTRIC (Right )     Patient location during evaluation: PACU Anesthesia Type: Spinal Level of consciousness: awake Pain management: pain level controlled Vital Signs Assessment: post-procedure vital signs reviewed and stable Respiratory status: spontaneous breathing Cardiovascular status: stable Postop Assessment: no apparent nausea or vomiting Anesthetic complications: no   No complications documented.  Last Vitals:  Vitals:   02/10/20 0800 02/10/20 1300  BP: (!) 137/46 107/71  Pulse: 70 69  Resp: 16 20  Temp: 37.1 C 36.8 C  SpO2: 97% 96%    Last Pain:  Vitals:   02/10/20 1300  TempSrc: Oral  PainSc: [place] Jr

## 2020-02-11 DIAGNOSIS — Z20822 Contact with and (suspected) exposure to covid-19: Secondary | ICD-10-CM | POA: Diagnosis not present

## 2020-02-11 DIAGNOSIS — K921 Melena: Secondary | ICD-10-CM | POA: Diagnosis not present

## 2020-02-11 DIAGNOSIS — N1832 Chronic kidney disease, stage 3b: Secondary | ICD-10-CM | POA: Diagnosis not present

## 2020-02-11 DIAGNOSIS — D649 Anemia, unspecified: Secondary | ICD-10-CM | POA: Diagnosis not present

## 2020-02-11 DIAGNOSIS — S72001D Fracture of unspecified part of neck of right femur, subsequent encounter for closed fracture with routine healing: Secondary | ICD-10-CM | POA: Diagnosis not present

## 2020-02-11 DIAGNOSIS — E559 Vitamin D deficiency, unspecified: Secondary | ICD-10-CM | POA: Diagnosis not present

## 2020-02-11 DIAGNOSIS — R109 Unspecified abdominal pain: Secondary | ICD-10-CM | POA: Diagnosis not present

## 2020-02-11 DIAGNOSIS — Z79899 Other long term (current) drug therapy: Secondary | ICD-10-CM | POA: Diagnosis not present

## 2020-02-11 DIAGNOSIS — R278 Other lack of coordination: Secondary | ICD-10-CM | POA: Diagnosis not present

## 2020-02-11 DIAGNOSIS — Z8249 Family history of ischemic heart disease and other diseases of the circulatory system: Secondary | ICD-10-CM | POA: Diagnosis not present

## 2020-02-11 DIAGNOSIS — S72009D Fracture of unspecified part of neck of unspecified femur, subsequent encounter for closed fracture with routine healing: Secondary | ICD-10-CM | POA: Diagnosis not present

## 2020-02-11 DIAGNOSIS — S7291XA Unspecified fracture of right femur, initial encounter for closed fracture: Secondary | ICD-10-CM | POA: Diagnosis not present

## 2020-02-11 DIAGNOSIS — R2681 Unsteadiness on feet: Secondary | ICD-10-CM | POA: Diagnosis not present

## 2020-02-11 DIAGNOSIS — E039 Hypothyroidism, unspecified: Secondary | ICD-10-CM | POA: Diagnosis not present

## 2020-02-11 DIAGNOSIS — K625 Hemorrhage of anus and rectum: Secondary | ICD-10-CM | POA: Diagnosis not present

## 2020-02-11 DIAGNOSIS — M1990 Unspecified osteoarthritis, unspecified site: Secondary | ICD-10-CM | POA: Diagnosis not present

## 2020-02-11 DIAGNOSIS — A0472 Enterocolitis due to Clostridium difficile, not specified as recurrent: Secondary | ICD-10-CM | POA: Diagnosis not present

## 2020-02-11 DIAGNOSIS — M2550 Pain in unspecified joint: Secondary | ICD-10-CM | POA: Diagnosis not present

## 2020-02-11 DIAGNOSIS — I5032 Chronic diastolic (congestive) heart failure: Secondary | ICD-10-CM | POA: Diagnosis not present

## 2020-02-11 DIAGNOSIS — S72001A Fracture of unspecified part of neck of right femur, initial encounter for closed fracture: Secondary | ICD-10-CM | POA: Diagnosis not present

## 2020-02-11 DIAGNOSIS — E1122 Type 2 diabetes mellitus with diabetic chronic kidney disease: Secondary | ICD-10-CM | POA: Diagnosis not present

## 2020-02-11 DIAGNOSIS — Z7984 Long term (current) use of oral hypoglycemic drugs: Secondary | ICD-10-CM | POA: Diagnosis not present

## 2020-02-11 DIAGNOSIS — R269 Unspecified abnormalities of gait and mobility: Secondary | ICD-10-CM | POA: Diagnosis not present

## 2020-02-11 DIAGNOSIS — R6889 Other general symptoms and signs: Secondary | ICD-10-CM | POA: Diagnosis not present

## 2020-02-11 DIAGNOSIS — I4819 Other persistent atrial fibrillation: Secondary | ICD-10-CM | POA: Diagnosis not present

## 2020-02-11 DIAGNOSIS — E785 Hyperlipidemia, unspecified: Secondary | ICD-10-CM | POA: Diagnosis not present

## 2020-02-11 DIAGNOSIS — Z7401 Bed confinement status: Secondary | ICD-10-CM | POA: Diagnosis not present

## 2020-02-11 DIAGNOSIS — M6281 Muscle weakness (generalized): Secondary | ICD-10-CM | POA: Diagnosis not present

## 2020-02-11 DIAGNOSIS — Z743 Need for continuous supervision: Secondary | ICD-10-CM | POA: Diagnosis not present

## 2020-02-11 DIAGNOSIS — N179 Acute kidney failure, unspecified: Secondary | ICD-10-CM | POA: Diagnosis not present

## 2020-02-11 DIAGNOSIS — E1165 Type 2 diabetes mellitus with hyperglycemia: Secondary | ICD-10-CM | POA: Diagnosis not present

## 2020-02-11 DIAGNOSIS — R2689 Other abnormalities of gait and mobility: Secondary | ICD-10-CM | POA: Diagnosis not present

## 2020-02-11 DIAGNOSIS — E1100 Type 2 diabetes mellitus with hyperosmolarity without nonketotic hyperglycemic-hyperosmolar coma (NKHHC): Secondary | ICD-10-CM | POA: Diagnosis not present

## 2020-02-11 DIAGNOSIS — R531 Weakness: Secondary | ICD-10-CM | POA: Diagnosis not present

## 2020-02-11 DIAGNOSIS — T07XXXA Unspecified multiple injuries, initial encounter: Secondary | ICD-10-CM | POA: Diagnosis not present

## 2020-02-11 DIAGNOSIS — Z950 Presence of cardiac pacemaker: Secondary | ICD-10-CM | POA: Diagnosis not present

## 2020-02-11 DIAGNOSIS — K591 Functional diarrhea: Secondary | ICD-10-CM | POA: Diagnosis not present

## 2020-02-11 DIAGNOSIS — Z882 Allergy status to sulfonamides status: Secondary | ICD-10-CM | POA: Diagnosis not present

## 2020-02-11 DIAGNOSIS — M810 Age-related osteoporosis without current pathological fracture: Secondary | ICD-10-CM | POA: Diagnosis not present

## 2020-02-11 DIAGNOSIS — Z7982 Long term (current) use of aspirin: Secondary | ICD-10-CM | POA: Diagnosis not present

## 2020-02-11 DIAGNOSIS — M6250 Muscle wasting and atrophy, not elsewhere classified, unspecified site: Secondary | ICD-10-CM | POA: Diagnosis not present

## 2020-02-11 DIAGNOSIS — I130 Hypertensive heart and chronic kidney disease with heart failure and stage 1 through stage 4 chronic kidney disease, or unspecified chronic kidney disease: Secondary | ICD-10-CM | POA: Diagnosis not present

## 2020-02-11 DIAGNOSIS — Z833 Family history of diabetes mellitus: Secondary | ICD-10-CM | POA: Diagnosis not present

## 2020-02-11 DIAGNOSIS — N183 Chronic kidney disease, stage 3 unspecified: Secondary | ICD-10-CM | POA: Diagnosis not present

## 2020-02-11 DIAGNOSIS — R58 Hemorrhage, not elsewhere classified: Secondary | ICD-10-CM | POA: Diagnosis not present

## 2020-02-11 DIAGNOSIS — Z888 Allergy status to other drugs, medicaments and biological substances status: Secondary | ICD-10-CM | POA: Diagnosis not present

## 2020-02-11 DIAGNOSIS — R262 Difficulty in walking, not elsewhere classified: Secondary | ICD-10-CM | POA: Diagnosis not present

## 2020-02-11 DIAGNOSIS — D631 Anemia in chronic kidney disease: Secondary | ICD-10-CM | POA: Diagnosis not present

## 2020-02-11 DIAGNOSIS — I4891 Unspecified atrial fibrillation: Secondary | ICD-10-CM | POA: Diagnosis not present

## 2020-02-11 LAB — CBC WITH DIFFERENTIAL/PLATELET
Abs Immature Granulocytes: 0.12 10*3/uL — ABNORMAL HIGH (ref 0.00–0.07)
Basophils Absolute: 0 10*3/uL (ref 0.0–0.1)
Basophils Relative: 0 %
Eosinophils Absolute: 0 10*3/uL (ref 0.0–0.5)
Eosinophils Relative: 0 %
HCT: 21.9 % — ABNORMAL LOW (ref 36.0–46.0)
Hemoglobin: 6.8 g/dL — CL (ref 12.0–15.0)
Immature Granulocytes: 1 %
Lymphocytes Relative: 11 %
Lymphs Abs: 1.4 10*3/uL (ref 0.7–4.0)
MCH: 29.6 pg (ref 26.0–34.0)
MCHC: 31.1 g/dL (ref 30.0–36.0)
MCV: 95.2 fL (ref 80.0–100.0)
Monocytes Absolute: 1.4 10*3/uL — ABNORMAL HIGH (ref 0.1–1.0)
Monocytes Relative: 10 %
Neutro Abs: 10.3 10*3/uL — ABNORMAL HIGH (ref 1.7–7.7)
Neutrophils Relative %: 78 %
Platelets: 152 10*3/uL (ref 150–400)
RBC: 2.3 MIL/uL — ABNORMAL LOW (ref 3.87–5.11)
RDW: 12.8 % (ref 11.5–15.5)
WBC: 13.2 10*3/uL — ABNORMAL HIGH (ref 4.0–10.5)
nRBC: 0 % (ref 0.0–0.2)

## 2020-02-11 LAB — GLUCOSE, CAPILLARY
Glucose-Capillary: 155 mg/dL — ABNORMAL HIGH (ref 70–99)
Glucose-Capillary: 183 mg/dL — ABNORMAL HIGH (ref 70–99)
Glucose-Capillary: 192 mg/dL — ABNORMAL HIGH (ref 70–99)

## 2020-02-11 LAB — URINE CULTURE
Culture: NO GROWTH
Special Requests: NORMAL

## 2020-02-11 LAB — BASIC METABOLIC PANEL
Anion gap: 8 (ref 5–15)
BUN: 49 mg/dL — ABNORMAL HIGH (ref 8–23)
CO2: 21 mmol/L — ABNORMAL LOW (ref 22–32)
Calcium: 8 mg/dL — ABNORMAL LOW (ref 8.9–10.3)
Chloride: 101 mmol/L (ref 98–111)
Creatinine, Ser: 1.77 mg/dL — ABNORMAL HIGH (ref 0.44–1.00)
GFR, Estimated: 28 mL/min — ABNORMAL LOW (ref >60)
Glucose, Bld: 174 mg/dL — ABNORMAL HIGH (ref 70–99)
Potassium: 5.1 mmol/L (ref 3.5–5.1)
Sodium: 130 mmol/L — ABNORMAL LOW (ref 135–145)

## 2020-02-11 LAB — HEMOGLOBIN AND HEMATOCRIT, BLOOD
HCT: 28.4 % — ABNORMAL LOW (ref 36.0–46.0)
Hemoglobin: 9.1 g/dL — ABNORMAL LOW (ref 12.0–15.0)

## 2020-02-11 LAB — MAGNESIUM: Magnesium: 2.5 mg/dL — ABNORMAL HIGH (ref 1.7–2.4)

## 2020-02-11 LAB — PREPARE RBC (CROSSMATCH)

## 2020-02-11 MED ORDER — LORAZEPAM 1 MG PO TABS: 1.0000 mg | ORAL_TABLET | Freq: Three times a day (TID) | ORAL | 0 refills | Status: AC | PRN

## 2020-02-11 MED ORDER — SODIUM CHLORIDE 0.9% IV SOLUTION: Freq: Once | INTRAVENOUS | Status: AC

## 2020-02-11 NOTE — Progress Notes (Signed)
Patient unable to urinate. Foley catheter placed with 300 mL clear, yellow urine returned.

## 2020-02-11 NOTE — TOC Transition Note (Signed)
Transition of Care [facility]) - CM/SW Discharge Note   Patient Details  Name: Ann Fletcher MRN: [id number] Date of Birth: 23-Jun-1932  Transition of Care [facility]) CM/SW Contact:  Lia Hopping, LCSW Phone Number:  02/11/2020, 12:12 PM   Clinical Narrative [facility] ready to [facility] [phone] [facility] arranged to transport the patient  Nurse call report to: [phone] CSW left voicemail for relative [facility].    Final next level of care: [place] Barriers to Discharge: Barriers Resolved   Patient Goals and CMS Choice Patient states their goals for this hospitalization and ongoing recovery are:: to eventually return home      Discharge Placement PASRR number recieved: 02/10/20            Patient chooses bed at: [facility] Patient to be transferred to facility by: [place] Name of family member notified: [name] Patient and family notified of of transfer: 02/11/20  Discharge Plan and Services In-house Referral: Clinical Social Work              DME Arranged: N/A DME Agency: NA       HH Arranged: NA HH Agency: NA        Social Determinants of Health ([place]) Interventions     Readmission Risk Interventions Readmission Risk Prevention Plan 02/09/2020  Transportation Screening Complete  PCP or Specialist Appt within 5-7 Days Complete  Medication Review (RN CM) Complete  Some recent data might be hidden

## 2020-02-11 NOTE — Progress Notes (Signed)
   ORTHOPAEDIC PROGRESS NOTE  s/p Procedure(s): INTRAMEDULLARY (IM) NAIL INTERTROCHANTRIC  SUBJECTIVE: Patient sitting up in hospital bed. Still having dryness in her mouth. Hgb 6.8 overnight receiving PRBCs. No therapy yesterday.   OBJECTIVE: PE: General: resting in hospital bed, NAD Pulm: no increased work of breathing Cardiac: regular rate Right lower extremity: dressings CDI, minimal tenderness to palpation right hip and right knee, intact EHL/TA/GSC, endorses distal sensation, warm well perfused foot   Vitals:   02/11/20 0545 02/11/20 0625  BP: 125/77 (!) 125/46  Pulse: 80 69  Resp: 16 16  Temp: 98.9 F (37.2 C) 98.5 F (36.9 C)  SpO2: 93% 93%     ASSESSMENT: Ann Fletcher is a 86 y.o. female POD#3  PLAN: Weightbearing: WBAT RLE Insicional and dressing care: Reinforce dressings as needed Orthopedic device(s): None Showering: Post-op day #2 with assistance VTE prophylaxis: Lovenox 30 mg QD - hold until Hgb stable Pain control: PRN pain medications, preferring oral medications ABLA: Hgb 6.8. Receiving PRBCs.  Follow - up plan: 2 weeks in office for incision check and XR.  Contact information:  Weekdays 8-5 Noemi Chapel PA-C [phone], After hours and holidays please check [email or web address] for group call information for [facility]  Dispo: PT/OT recommending SNF. TOC following. Patient agreeable to [facility]. Pain medication and DVT prophylaxis printed and placed in patient's chart. Patient currently not medically stable for discharge - having urinary retention, Hgb 6.8 receiving PBCs. Once medically stable and cleared by medicine team and therapies, patient okay to discharge to SNF from orthopedic standpoint.    Noemi Chapel, PA-C 02/11/2020

## 2020-02-11 NOTE — Discharge Summary (Signed)
Physician Discharge Summary  Ann Fletcher EZM:[id number] DOB: 26-Jan-1933 DOA: 02/07/2020  PCP: [name], MD  Admit date: 02/07/2020 Discharge date: 02/11/2020  Admitted From: Home Disposition: Skilled nursing facility  Recommendations for Outpatient Follow-up:  1. Follow up with PCP in 1-2 weeks 2. Please obtain BMP/CBC in 3 days 3. Follow-up with orthopedics as a scheduled  Home Health: Not applicable Equipment/Devices: Not applicable  Discharge Condition: Stable CODE STATUS: Full code Diet recommendation: Low-salt, low-carb diet  Discharge summary: 86 year old female with history of A. fib, AV node ablation and status post pacemaker, diastolic heart failure, hypertension, type 2 diabetes, CKD stage IIIa and hypothyroidism.  Admitted to the hospital with mechanical fall and right hip pain.  She was found to have right hip fracture.  Hospital course complicated with blood loss anemia and urinary retention due to hip fracture.  Stabilized now, will need inpatient therapies.  Right hip fracture: Status post IM nail on 11/8.  Surgically stable as per surgery. -Weightbearing as tolerated -Oxycodone and Tylenol for pain relief along with laxatives. -Lovenox 30 mg every day for DVT prophylaxis -Inpatient PT OT at a skilled nursing facility,   Type 2 diabetes uncontrolled with hyperglycemia/dyslipidemia associated with type 2 diabetes:  Patient takes glipizide at home.  She will resume that today.    Acute kidney injury on chronic kidney disease stage IIIa: Her creatinine fluctuates.  Patient was clinically dehydrated.  Treated with IV fluids with improvement. Recheck a renal function test in about 3 to 4 days to ensure stability. Baseline creatinine about 1.3-1.4.  Leukocytosis: Probably reactive.  No evidence of active infection.    Urinalysis was normal.  Hypothyroidism: Stable on Synthroid.  A. fib, paroxysmal A. fib: Rate controlled.  Pacemaker in place.  Not on  anticoagulation.  Anemia with expected blood loss from long bone fractures and surgery:  Hemoglobin baseline 11 -dropped to 10 -9 -8 -6.8.  No other evidence of bleeding.   1 unit of PRBC today with appropriate response, hemoglobin is 9.  Hyponatremia: Probably hyperosmolar hyponatremia.  Discontinue IV fluids.  Resume Lasix.  Recheck at the skilled nursing facility to ensure stability.  Acute urinary retention: Patient had recurrent urinary retention mainly related to hip fracture, immobility and pain.  She continued to retain despite a straight cath.  Second Foley catheter was placed.  Remove Foley catheter in 1 week and give voiding trial.  Patient is stable to transfer to skilled level of care. Discharged with pain medications. Discharged with Foley catheter in place with plan for voiding trial in 1 week.   Discharge Diagnoses:  Principal Problem:   Fracture, proximal femur, right, closed, initial encounter [facility]) Active Problems:   Essential hypertension   Type 2 diabetes mellitus with renal manifestations, controlled ([place])   Chronic diastolic CHF (congestive heart failure) (HCC)   Interstitial lung disease (HCC)   Pacemaker   CRI (chronic renal insufficiency), stage 4 (severe) (HCC)   Hip fracture [place])    Discharge Instructions  Discharge Instructions    Call MD for:  redness, tenderness, or signs of infection (pain, swelling, redness, odor or green/yellow discharge around incision site)   Complete by: As directed    Call MD for:  severe uncontrolled pain   Complete by: As directed    Diet - low sodium heart healthy   Complete by: As directed    Diet Carb Modified   Complete by: As directed    Discharge instructions   Complete by: As directed  CBC and BMP in 3 days. Foley catheter voiding trial in 1 week.   Increase activity slowly   Complete by: As directed    No dressing needed   Complete by: As directed      Allergies as of 02/11/2020      Reactions    Tizanidine Hcl Other (See Comments)   Dizziness, patient reports fall   Nsaids Itching, Other (See Comments)   Stomach cramps   Pradaxa [dabigatran Etexilate Mesylate] Nausea And Vomiting   Sulfa Antibiotics Nausea And Vomiting   Betadine [povidone Iodine] Itching, Rash      Medication List    TAKE these medications   albuterol 108 (90 Base) MCG/ACT inhaler Commonly known as: VENTOLIN HFA Inhale 2 puffs into the lungs 3 (three) times daily as needed for wheezing or shortness of breath.   aspirin 81 MG chewable tablet Chew 81 mg by mouth daily.   atorvastatin 40 MG tablet Commonly known as: LIPITOR Take 40 mg by mouth at bedtime.   enoxaparin 30 MG/0.3ML injection Commonly known as: Lovenox Inject 0.3 mLs (30 mg total) into the skin daily. For DVT prophylaxis after surgery   fluticasone 50 MCG/ACT nasal spray Commonly known as: FLONASE Place 1 spray into both nostrils daily as needed for allergies.   furosemide 80 MG tablet Commonly known as: LASIX TAKE ONE WHOLE TABLET (80MG ) IN THE MORNING AND HALF TABLET (40 MG) IN THE EVENING. What changed:   how much to take  how to take this  when to take this  additional instructions   glimepiride 2 MG tablet Commonly known as: AMARYL Take 2 mg by mouth every morning.   HYDROcodone-acetaminophen 5-325 MG tablet Commonly known as: Norco Take 0.5-1 tablets by mouth every 6 (six) hours as needed for severe pain.   levothyroxine 100 MCG tablet Commonly known as: SYNTHROID Take 100 mcg by mouth daily before breakfast.   LORazepam 1 MG tablet Commonly known as: ATIVAN Take 1 tablet (1 mg total) by mouth 3 (three) times daily as needed for up to 5 days for anxiety.   OSCAL 500/200 D-3 PO Take 1 tablet by mouth daily.   potassium chloride SA 20 MEQ tablet Commonly known as: KLOR-CON Take 20 mEq by mouth 2 (two) times daily.   Prodigy Lancing Device Misc See admin instructions.   Prodigy Twist Top Lancets 28G  Misc CHECK BLOOD GLUCOSE (SUGAR) TWICE DAILY   TYLENOL 500 MG tablet Generic drug: acetaminophen Take 1,000 mg by mouth at bedtime.   VITAMIN B 12 PO Take 1 tablet by mouth daily.   vitamin E 180 MG (400 UNITS) capsule Generic drug: vitamin E Take 400 Units by mouth daily.            Discharge Care Instructions  (From admission, onward)         Start     Ordered   02/11/20 0000  No dressing needed        02/11/20 1201          Contact information for follow-up providers    Hiram Gash, MD In 2 weeks.   Specialty: Orthopedic Surgery Why: For wound re-check Contact information: [address] [place] [address] [phone]            Contact information for after-discharge care    Destination    [facility] Preferred SNF .   Service: Skilled Chiropodist information: [place] [phone]  Allergies  Allergen Reactions  . Tizanidine Hcl Other (See Comments)    Dizziness, patient reports fall  . Nsaids Itching and Other (See Comments)    Stomach cramps   . Pradaxa [Dabigatran Etexilate Mesylate] Nausea And Vomiting  . Sulfa Antibiotics Nausea And Vomiting  . Betadine [Povidone Iodine] Itching and Rash    Consultations:  Orthopedics  Cardiology   Procedures/Studies: DG Chest 1 View  Result Date: 02/07/2020 CLINICAL DATA:  Tripped and fell, right femoral fracture EXAM: CHEST  1 VIEW COMPARISON:  12/25/2017 FINDINGS: Single frontal view of the chest demonstrates dual lead pacer overlying left chest. Cardiac silhouette remains enlarged. There is increased central vascular congestion. Patchy bilateral airspace disease may reflect edema. No effusion or pneumothorax. No acute bony abnormalities. IMPRESSION: 1. Findings most consistent with mild congestive heart failure. Superimposed infection cannot be excluded. Electronically Signed   By:  Randa Ngo M.D.   On: 02/07/2020 23:16   DG Pelvis 1-2 Views  Result Date: 02/07/2020 CLINICAL DATA:  Pain EXAM: PELVIS - 1-2 VIEW COMPARISON:  None. FINDINGS: There is a partially visualized acute displaced fracture of the proximal right femur. There is diffuse osteopenia. There is an old healed fracture of the inferior pubic ramus on the right. IMPRESSION: Partially visualized acute displaced fracture of the proximal right femur. Electronically Signed   By: Constance Holster M.D.   On: 02/07/2020 23:14   DG C-Arm 1-60 Min-No Report  Result Date: 02/08/2020 Fluoroscopy was utilized by the requesting physician.  No radiographic interpretation.   DG Hip Port Unilat With Pelvis 1V Right  Result Date: 02/08/2020 CLINICAL DATA:  IM nail fixation EXAM: DG HIP (WITH OR WITHOUT PELVIS) 1V PORT RIGHT COMPARISON:  Radiograph same day FINDINGS: The patient is status post ORIF with intramedullary rod and screw fixation of the proximal right femoral fracture. There is mildly displaced lesser trochanter. No change in alignment. No new fracture seen. Overlying soft tissue swelling. IMPRESSION: Status post ORIF with IM nail fixation of proximal femur fracture. No acute hardware complication. Electronically Signed   By: Prudencio Pair M.D.   On: 02/08/2020 18:11   DG FEMUR, MIN 2 VIEWS RIGHT  Result Date: 02/08/2020 CLINICAL DATA:  ORIF right femur. EXAM: RIGHT FEMUR 2 VIEWS COMPARISON:  None. FINDINGS: 4 intraoperative spot fluoro films show antegrade femoral IM nail with interlocking dynamic femoral neck screw and 2 distal interlocking screws. IM nail transfixes a proximal femur fracture. No evidence for immediate hardware complication. IMPRESSION: Intraoperative assessment during ORIF for proximal femur fracture. Electronically Signed   By: Misty Stanley M.D.   On: 02/08/2020 17:53   DG Femur Min 2 Views Right  Result Date: 02/07/2020 CLINICAL DATA:  Tripped and fell EXAM: RIGHT FEMUR 2 VIEWS COMPARISON:   None. FINDINGS: Frontal and lateral views of the right femur demonstrate a comminuted proximal right femoral fracture, involving the lesser trochanter and proximal femoral diaphysis. There is varus angulation and impaction at the fracture site. No dislocation. Right knee arthroplasty is identified without complication. Extensive atherosclerosis is noted. IMPRESSION: 1. Comminuted proximal right femoral fracture, involving the proximal femoral diaphysis and lesser trochanter. Varus angulation and impaction. Electronically Signed   By: Randa Ngo M.D.   On: 02/07/2020 23:12   (Echo, Carotid, EGD, Colonoscopy, ERCP)    Subjective: Patient seen and examined.  Overnight events noted.  Patient herself had no complaints until she moves.  Hemoglobin was 6.8 and was receiving 1 units of PRBC.  Appetite is poor but denies  any nausea vomiting.  Denies any abdominal pain.   Discharge Exam: Vitals:   02/11/20 0924 02/11/20 1150  BP: (!) 142/62 (!) 147/57  Pulse: 69 70  Resp: 16 18  Temp: 98.6 F (37 C)   SpO2: 93% 95%   Vitals:   02/11/20 0545 02/11/20 0625 02/11/20 0924 02/11/20 1150  BP: 125/77 (!) 125/46 (!) 142/62 (!) 147/57  Pulse: 80 69 69 70  Resp: 16 16 16 18   Temp: 98.9 F (37.2 C) 98.5 F (36.9 C) 98.6 F (37 C)   TempSrc: Oral Oral Oral   SpO2: 93% 93% 93% 95%  Weight:      Height:        General: Pt is alert, awake, not in acute distress Chronically sick looking.  On room air without any difficulty breathing. Cardiovascular: RRR, S1/S2 +, no rubs, no gallops, pacemaker in place left precordium. Respiratory: CTA bilaterally, no wheezing, no rhonchi Abdominal: Soft, NT, ND, bowel sounds + Extremities: no edema, no cyanosis Right hip incision site clean and dry with minimal swelling.    The results of significant diagnostics from this hospitalization (including imaging, microbiology, ancillary and laboratory) are listed below for reference.     Microbiology: Recent  Results (from the past 240 hour(s))  Respiratory Panel by RT PCR (Flu A&B, Covid) - Nasopharyngeal Swab     Status: None   Collection Time: 02/07/20 11:20 PM   Specimen: Nasopharyngeal Swab  Result Value Ref Range Status   SARS Coronavirus 2 by RT PCR NEGATIVE NEGATIVE Final    Comment: (NOTE) SARS-CoV-2 target nucleic acids are NOT DETECTED.  The SARS-CoV-2 RNA is generally detectable in upper respiratoy specimens during the acute phase of infection. The lowest concentration of SARS-CoV-2 viral copies this assay can detect is 131 copies/mL. A negative result does not preclude SARS-Cov-2 infection and should not be used as the sole basis for treatment or other patient management decisions. A negative result may occur with  improper specimen collection/handling, submission of specimen other than nasopharyngeal swab, presence of viral mutation(s) within the areas targeted by this assay, and inadequate number of viral copies (<131 copies/mL). A negative result must be combined with clinical observations, patient history, and epidemiological information. The expected result is Negative.  Fact Sheet for Patients:  PinkCheek.be  Fact Sheet for Healthcare Providers:  GravelBags.it  This test is no t yet approved or cleared by the [place] FDA and  has been authorized for detection and/or diagnosis of SARS-CoV-2 by FDA under an Emergency Use Authorization (EUA). This EUA will remain  in effect (meaning this test can be used) for the duration of the COVID-19 declaration under Section 564(b)(1) of the Act, 21 U.S.C. section 360bbb-3(b)(1), unless the authorization is terminated or revoked sooner.     Influenza A by PCR NEGATIVE NEGATIVE Final   Influenza B by PCR NEGATIVE NEGATIVE Final    Comment: (NOTE) The Xpert Xpress SARS-CoV-2/FLU/RSV assay is intended as an aid in  the diagnosis of influenza from Nasopharyngeal swab  specimens and  should not be used as a sole basis for treatment. Nasal washings and  aspirates are unacceptable for Xpert Xpress SARS-CoV-2/FLU/RSV  testing.  Fact Sheet for Patients: PinkCheek.be  Fact Sheet for Healthcare Providers: GravelBags.it  This test is not yet approved or cleared by the [place] FDA and  has been authorized for detection and/or diagnosis of SARS-CoV-2 by  FDA under an Emergency Use Authorization (EUA). This EUA will remain  in effect (meaning this  test can be used) for the duration of the  Covid-19 declaration under Section 564(b)(1) of the Act, 21  U.S.C. section 360bbb-3(b)(1), unless the authorization is  terminated or revoked. Performed at [facility], [place] [address] [place], [place] [address]   Surgical pcr screen     Status: None   Collection Time: 02/08/20  2:09 PM   Specimen: Nasal Mucosa; Nasal Swab  Result Value Ref Range Status   MRSA, PCR NEGATIVE NEGATIVE Final   Staphylococcus aureus NEGATIVE NEGATIVE Final    Comment: (NOTE) The Xpert SA Assay (FDA approved for NASAL specimens in patients 78 years of age and older), is one component of a comprehensive surveillance program. It is not intended to diagnose infection nor to guide or monitor treatment. Performed at [facility], [place] [address], [place], [place] [address]   Respiratory Panel by RT PCR (Flu A&B, Covid) - Nasopharyngeal Swab     Status: None   Collection Time: 02/10/20 10:53 AM   Specimen: Nasopharyngeal Swab  Result Value Ref Range Status   SARS Coronavirus 2 by RT PCR NEGATIVE NEGATIVE Final    Comment: (NOTE) SARS-CoV-2 target nucleic acids are NOT DETECTED.  The SARS-CoV-2 RNA is generally detectable in upper respiratoy specimens during the acute phase of infection. The lowest concentration of SARS-CoV-2 viral copies this assay can detect is 131 copies/mL. A negative result does  not preclude SARS-Cov-2 infection and should not be used as the sole basis for treatment or other patient management decisions. A negative result may occur with  improper specimen collection/handling, submission of specimen other than nasopharyngeal swab, presence of viral mutation(s) within the areas targeted by this assay, and inadequate number of viral copies (<131 copies/mL). A negative result must be combined with clinical observations, patient history, and epidemiological information. The expected result is Negative.  Fact Sheet for Patients:  PinkCheek.be  Fact Sheet for Healthcare Providers:  GravelBags.it  This test is no t yet approved or cleared by the [place] FDA and  has been authorized for detection and/or diagnosis of SARS-CoV-2 by FDA under an Emergency Use Authorization (EUA). This EUA will remain  in effect (meaning this test can be used) for the duration of the COVID-19 declaration under Section 564(b)(1) of the Act, 21 U.S.C. section 360bbb-3(b)(1), unless the authorization is terminated or revoked sooner.     Influenza A by PCR NEGATIVE NEGATIVE Final   Influenza B by PCR NEGATIVE NEGATIVE Final    Comment: (NOTE) The Xpert Xpress SARS-CoV-2/FLU/RSV assay is intended as an aid in  the diagnosis of influenza from Nasopharyngeal swab specimens and  should not be used as a sole basis for treatment. Nasal washings and  aspirates are unacceptable for Xpert Xpress SARS-CoV-2/FLU/RSV  testing.  Fact Sheet for Patients: PinkCheek.be  Fact Sheet for Healthcare Providers: GravelBags.it  This test is not yet approved or cleared by the [place] FDA and  has been authorized for detection and/or diagnosis of SARS-CoV-2 by  FDA under an Emergency Use Authorization (EUA). This EUA will remain  in effect (meaning this test can be used) for the  duration of the  Covid-19 declaration under Section 564(b)(1) of the Act, 21  U.S.C. section 360bbb-3(b)(1), unless the authorization is  terminated or revoked. Performed at [facility], [place] [address] [place],  [address]      Labs: BNP (last 3 results) No results for input(s): BNP in the last 8760 hours. Basic Metabolic Panel: Recent Labs  Lab 02/07/20 2320 02/08/20 0500 02/09/20 0320 02/10/20 0315 02/11/20 0311  NA 137 140 136 132* 130*  K 4.9 4.8 4.3 4.6 5.1  CL 101 105 103 100 101  CO2 25 26 24 25  21*  GLUCOSE 289* 184* 158* 175* 174*  BUN 41* 37* 37* 43* 49*  CREATININE 1.42* 1.39* 1.51* 1.87* 1.77*  CALCIUM 9.3 9.1 8.6* 8.4* 8.0*  MG  --   --   --   --  2.5*   Liver Function Tests: No results for input(s): AST, ALT, ALKPHOS, BILITOT, PROT, ALBUMIN in the last 168 hours. No results for input(s): LIPASE, AMYLASE in the last 168 hours. No results for input(s): AMMONIA in the last 168 hours. CBC: Recent Labs  Lab 02/07/20 2320 02/07/20 2320 02/08/20 0500 02/09/20 0320 02/10/20 0315 02/11/20 0311 02/11/20 1124  WBC 14.3*  --  12.4* 10.7* 15.8* 13.2*  --   NEUTROABS 12.2*  --   --   --  11.6* 10.3*  --   HGB 11.4*   < > 10.0* 8.0* 8.1* 6.8* 9.1*  HCT 37.0   < > 31.5* 25.4* 25.5* 21.9* 28.4*  MCV 93.4  --  91.6 94.4 92.7 95.2  --   PLT 174  --  174 139* 155 152  --    < > = values in this interval not displayed.   Cardiac Enzymes: No results for input(s): CKTOTAL, CKMB, CKMBINDEX, TROPONINI in the last 168 hours. BNP: Invalid input(s): POCBNP CBG: Recent Labs  Lab 02/10/20 1638 02/10/20 2208 02/11/20 0053 02/11/20 0730 02/11/20 1146  GLUCAP 174* 205* 183* 155* 192*   D-Dimer No results for input(s): DDIMER in the last 72 hours. Hgb A1c No results for input(s): HGBA1C in the last 72 hours. Lipid Profile No results for input(s): CHOL, HDL, LDLCALC, TRIG, CHOLHDL, LDLDIRECT in the last 72 hours. Thyroid function  studies No results for input(s): TSH, T4TOTAL, T3FREE, THYROIDAB in the last 72 hours.  Invalid input(s): FREET3 Anemia work up No results for input(s): VITAMINB12, FOLATE, FERRITIN, TIBC, IRON, RETICCTPCT in the last 72 hours. Urinalysis    Component Value Date/Time   COLORURINE YELLOW 02/10/2020 [place] 02/10/2020 1514   LABSPEC 1.015 02/10/2020 1514   PHURINE 5.0 02/10/2020 1514   GLUCOSEU NEGATIVE 02/10/2020 1514   HGBUR NEGATIVE 02/10/2020 1514   BILIRUBINUR NEGATIVE 02/10/2020 1514   KETONESUR NEGATIVE 02/10/2020 1514   PROTEINUR NEGATIVE 02/10/2020 1514   UROBILINOGEN 0.2 10/22/2010 1936   NITRITE NEGATIVE 02/10/2020 1514   LEUKOCYTESUR NEGATIVE 02/10/2020 1514   Sepsis Labs Invalid input(s): PROCALCITONIN,  WBC,  LACTICIDVEN Microbiology Recent Results (from the past 240 hour(s))  Respiratory Panel by RT PCR (Flu A&B, Covid) - Nasopharyngeal Swab     Status: None   Collection Time: 02/07/20 11:20 PM   Specimen: Nasopharyngeal Swab  Result Value Ref Range Status   SARS Coronavirus 2 by RT PCR NEGATIVE NEGATIVE Final    Comment: (NOTE) SARS-CoV-2 target nucleic acids are NOT DETECTED.  The SARS-CoV-2 RNA is generally detectable in upper respiratoy specimens during the acute phase of infection. The lowest concentration of SARS-CoV-2 viral copies this assay can detect is 131 copies/mL. A negative result does not preclude SARS-Cov-2 infection and should not be used as the sole basis for treatment or other patient management decisions. A negative result may occur with  improper specimen collection/handling, submission of specimen other than nasopharyngeal swab, presence of viral mutation(s) within the areas targeted by this assay, and inadequate number  of viral copies (<131 copies/mL). A negative result must be combined with clinical observations, patient history, and epidemiological information. The expected result is Negative.  Fact Sheet for  Patients:  PinkCheek.be  Fact Sheet for Healthcare Providers:  GravelBags.it  This test is no t yet approved or cleared by the [place] FDA and  has been authorized for detection and/or diagnosis of SARS-CoV-2 by FDA under an Emergency Use Authorization (EUA). This EUA will remain  in effect (meaning this test can be used) for the duration of the COVID-19 declaration under Section 564(b)(1) of the Act, 21 U.S.C. section 360bbb-3(b)(1), unless the authorization is terminated or revoked sooner.     Influenza A by PCR NEGATIVE NEGATIVE Final   Influenza B by PCR NEGATIVE NEGATIVE Final    Comment: (NOTE) The Xpert Xpress SARS-CoV-2/FLU/RSV assay is intended as an aid in  the diagnosis of influenza from Nasopharyngeal swab specimens and  should not be used as a sole basis for treatment. Nasal washings and  aspirates are unacceptable for Xpert Xpress SARS-CoV-2/FLU/RSV  testing.  Fact Sheet for Patients: PinkCheek.be  Fact Sheet for Healthcare Providers: GravelBags.it  This test is not yet approved or cleared by the [place] FDA and  has been authorized for detection and/or diagnosis of SARS-CoV-2 by  FDA under an Emergency Use Authorization (EUA). This EUA will remain  in effect (meaning this test can be used) for the duration of the  Covid-19 declaration under Section 564(b)(1) of the Act, 21  U.S.C. section 360bbb-3(b)(1), unless the authorization is  terminated or revoked. Performed at [facility], [place] [address] [place], [place] [address]   Surgical pcr screen     Status: None   Collection Time: 02/08/20  2:09 PM   Specimen: Nasal Mucosa; Nasal Swab  Result Value Ref Range Status   MRSA, PCR NEGATIVE NEGATIVE Final   Staphylococcus aureus NEGATIVE NEGATIVE Final    Comment: (NOTE) The Xpert SA Assay (FDA approved for NASAL specimens in  patients 8 years of age and older), is one component of a comprehensive surveillance program. It is not intended to diagnose infection nor to guide or monitor treatment. Performed at [facility], [employer] [address], [place],  [address]   Respiratory Panel by RT PCR (Flu A&B, Covid) - Nasopharyngeal Swab     Status: None   Collection Time: 02/10/20 10:53 AM   Specimen: Nasopharyngeal Swab  Result Value Ref Range Status   SARS Coronavirus 2 by RT PCR NEGATIVE NEGATIVE Final    Comment: (NOTE) SARS-CoV-2 target nucleic acids are NOT DETECTED.  The SARS-CoV-2 RNA is generally detectable in upper respiratoy specimens during the acute phase of infection. The lowest concentration of SARS-CoV-2 viral copies this assay can detect is 131 copies/mL. A negative result does not preclude SARS-Cov-2 infection and should not be used as the sole basis for treatment or other patient management decisions. A negative result may occur with  improper specimen collection/handling, submission of specimen other than nasopharyngeal swab, presence of viral mutation(s) within the areas targeted by this assay, and inadequate number of viral copies (<131 copies/mL). A negative result must be combined with clinical observations, patient history, and epidemiological information. The expected result is Negative.  Fact Sheet for Patients:  PinkCheek.be  Fact Sheet for Healthcare Providers:  GravelBags.it  This test is no t yet approved or cleared by the [place] FDA and  has been authorized for detection and/or diagnosis of SARS-CoV-2 by FDA under an  Emergency Use Authorization (EUA). This EUA will remain  in effect (meaning this test can be used) for the duration of the COVID-19 declaration under Section 564(b)(1) of the Act, 21 U.S.C. section 360bbb-3(b)(1), unless the authorization is terminated or revoked sooner.      Influenza A by PCR NEGATIVE NEGATIVE Final   Influenza B by PCR NEGATIVE NEGATIVE Final    Comment: (NOTE) The Xpert Xpress SARS-CoV-2/FLU/RSV assay is intended as an aid in  the diagnosis of influenza from Nasopharyngeal swab specimens and  should not be used as a sole basis for treatment. Nasal washings and  aspirates are unacceptable for Xpert Xpress SARS-CoV-2/FLU/RSV  testing.  Fact Sheet for Patients: PinkCheek.be  Fact Sheet for Healthcare Providers: GravelBags.it  This test is not yet approved or cleared by the [place] FDA and  has been authorized for detection and/or diagnosis of SARS-CoV-2 by  FDA under an Emergency Use Authorization (EUA). This EUA will remain  in effect (meaning this test can be used) for the duration of the  Covid-19 declaration under Section 564(b)(1) of the Act, 21  U.S.C. section 360bbb-3(b)(1), unless the authorization is  terminated or revoked. Performed at [facility], [place] [address], [place], [place] [address]      Time coordinating discharge:  35 minutes  SIGNED:   Barb Merino, MD  [facility] 02/11/2020, 12:30 PM

## 2020-02-11 NOTE — Progress Notes (Signed)
Critical hemoglobin 6.8. Paged floor coverage.

## 2020-02-11 NOTE — Progress Notes (Signed)
Physical Therapy Treatment Patient Details Name: Ann Fletcher MRN: [id number] DOB: Dec 21, 1932 Today's Date: 02/11/2020    History of Present Illness 86 y.o. female with history of A fib not on anticoagulation due to GI bleed, diastolic CHF, s/p R TKA,  HTN, DM, CKD, hypothyroidism, nodal ablation s/p pacemaker who suffered a proximal femur fx s/pfall at home. Underwent IM Nail R femur 02/08/20.    PT Comments    Pt continues very cooperative but progressing very slowly with mobility.  Pt up to EOB to work on sitting balance and up in [place] to work on standing balance and for perineal hygiene.  Pt requests back to bed instead of up to chair 2* fatigue.   Follow Up Recommendations  SNF     Equipment Recommendations  None recommended by PT    Recommendations for Other Services       Precautions / Restrictions Precautions Precautions: Fall Restrictions Weight Bearing Restrictions: No Other Position/Activity Restrictions: WBAT    Mobility  Bed Mobility Overal bed mobility: Needs Assistance Bed Mobility: Supine to Sit;Sit to Supine     Supine to sit: Total assist;+2 for physical assistance;+2 for safety/equipment;HOB elevated Sit to supine: Total assist;+2 for physical assistance;+2 for safety/equipment   General bed mobility comments: Assist for trunk and bil LEs. Utilized bed pad to aid with scooting/positioning. Pt was able to move L LE some. Limited by pain.  Transfers Overall transfer level: Needs assistance Equipment used: Rolling walker (2 wheeled) Transfers: Sit to/from Stand Sit to Stand: Max assist;+2 physical assistance;+2 safety/equipment;From elevated surface         General transfer comment: Sit to stand x 2 using STEDY. Assist to power up, stabilize, control descent. Cues for safety, technique, LE placement. Pt stood in [place] x 2 min for perianeal hygiene 2* bowel incontinence.  Ambulation/Gait             General Gait Details: Pt stood  only   Marine scientist Rankin (Stroke Patients Only)       Balance Overall balance assessment: Needs assistance   Sitting balance-Leahy Scale: Fair Sitting balance - Comments: Posterior lean intially but able to progress to sitting EOB at Sup level     Standing balance-Leahy Scale: Poor                              Cognition Arousal/Alertness: Awake/alert Behavior During Therapy: WFL for tasks assessed/performed Overall Cognitive Status: No family/caregiver present to determine baseline cognitive functioning                                 General Comments: most likley at baseline; apparent STM deficits      Exercises General Exercises - Lower Extremity Ankle Circles/Pumps: AAROM;AROM;Both;15 reps;Supine Quad Sets: AROM;Both;5 reps;Supine Heel Slides: AAROM;Right;15 reps;Supine Hip ABduction/ADduction: AAROM;Right;15 reps;Supine    General Comments        Pertinent Vitals/Pain Pain Assessment: Faces Faces Pain Scale: Hurts little more Pain Location: R hip Pain Descriptors / Indicators: Aching;Discomfort;Grimacing;Guarding;Moaning Pain Intervention(s): Limited activity within patient's tolerance;Monitored during session;Premedicated before session;Ice applied    Home Living                      Prior Function  PT Goals (current goals can now be found in the care plan section) Acute Rehab PT Goals Patient Stated Goal: to get better. less pain. PT Goal Formulation: With patient Time For Goal Achievement: 02/23/20 Potential to Achieve Goals: Good Progress towards PT goals: Progressing toward goals    Frequency    Min 3X/week      PT Plan Current plan remains appropriate    Co-evaluation              AM-PAC PT "6 Clicks" Mobility   Outcome Measure  Help needed turning from your back to your side while in a flat bed without using bedrails?: Total Help  needed moving from lying on your back to sitting on the side of a flat bed without using bedrails?: Total Help needed moving to and from a bed to a chair (including a wheelchair)?: Total Help needed standing up from a chair using your arms (e.g., wheelchair or bedside chair)?: Total Help needed to walk in hospital room?: Total Help needed climbing 3-5 steps with a railing? : Total 6 Click Score: 6    End of Session Equipment Utilized During Treatment: Gait belt Activity Tolerance: Patient limited by pain;Patient limited by fatigue Patient left: in bed;with call bell/phone within reach;with bed alarm set Nurse Communication: Mobility status PT Visit Diagnosis: Muscle weakness (generalized) (M62.81);Pain;History of falling (Z91.81);Other abnormalities of gait and mobility (R26.89) Pain - Right/Left: Right Pain - part of body: Leg     Time: 5379-4327 PT Time Calculation (min) (ACUTE ONLY): 40 min  Charges:  $Therapeutic Exercise: 8-22 mins $Therapeutic Activity: 23-37 mins                     Debe Coder PT Acute Rehabilitation Services Pager [phone] Office [phone]    Irene Mitcham 02/11/2020, 12:14 PM

## 2020-02-12 ENCOUNTER — Telehealth: Payer: Self-pay | Admitting: *Deleted

## 2020-02-12 DIAGNOSIS — R269 Unspecified abnormalities of gait and mobility: Secondary | ICD-10-CM | POA: Diagnosis not present

## 2020-02-12 DIAGNOSIS — S72001A Fracture of unspecified part of neck of right femur, initial encounter for closed fracture: Secondary | ICD-10-CM | POA: Diagnosis not present

## 2020-02-12 DIAGNOSIS — N183 Chronic kidney disease, stage 3 unspecified: Secondary | ICD-10-CM | POA: Diagnosis not present

## 2020-02-12 DIAGNOSIS — I4891 Unspecified atrial fibrillation: Secondary | ICD-10-CM | POA: Diagnosis not present

## 2020-02-12 DIAGNOSIS — N179 Acute kidney failure, unspecified: Secondary | ICD-10-CM | POA: Diagnosis not present

## 2020-02-12 LAB — TYPE AND SCREEN
ABO/RH(D): AB NEG
Antibody Screen: NEGATIVE
Unit division: 0

## 2020-02-12 LAB — BPAM RBC
Blood Product Expiration Date: 202111302359
ISSUE DATE / TIME: 202111110557
Unit Type and Rh: 600

## 2020-02-12 NOTE — Telephone Encounter (Signed)
Call received from patient to inform Dr. Marin Olp that pt is currently at [facility] for rehab d/t recent fall.  Dr. Marin Olp notified.

## 2020-02-15 ENCOUNTER — Encounter (HOSPITAL_COMMUNITY): Payer: Self-pay | Admitting: Orthopaedic Surgery

## 2020-03-02 ENCOUNTER — Inpatient Hospital Stay: Payer: Medicare Other | Admitting: Family

## 2020-03-02 ENCOUNTER — Inpatient Hospital Stay: Payer: Medicare Other

## 2020-03-02 ENCOUNTER — Inpatient Hospital Stay: Payer: Medicare Other | Attending: Hematology & Oncology

## 2020-03-08 ENCOUNTER — Inpatient Hospital Stay (HOSPITAL_COMMUNITY)
Admission: EM | Admit: 2020-03-08 | Discharge: 2020-03-11 | DRG: 372 | Disposition: A | Discharge status: Skilled Nursing Facility | Payer: Medicare Other | Source: Skilled Nursing Facility | Attending: Internal Medicine | Admitting: Internal Medicine

## 2020-03-08 ENCOUNTER — Other Ambulatory Visit: Payer: Self-pay

## 2020-03-08 ENCOUNTER — Encounter (HOSPITAL_COMMUNITY): Payer: Self-pay | Admitting: *Deleted

## 2020-03-08 DIAGNOSIS — Z7984 Long term (current) use of oral hypoglycemic drugs: Secondary | ICD-10-CM

## 2020-03-08 DIAGNOSIS — I130 Hypertensive heart and chronic kidney disease with heart failure and stage 1 through stage 4 chronic kidney disease, or unspecified chronic kidney disease: Secondary | ICD-10-CM | POA: Diagnosis not present

## 2020-03-08 DIAGNOSIS — A0472 Enterocolitis due to Clostridium difficile, not specified as recurrent: Secondary | ICD-10-CM | POA: Diagnosis not present

## 2020-03-08 DIAGNOSIS — K625 Hemorrhage of anus and rectum: Secondary | ICD-10-CM

## 2020-03-08 DIAGNOSIS — F439 Reaction to severe stress, unspecified: Secondary | ICD-10-CM | POA: Diagnosis present

## 2020-03-08 DIAGNOSIS — E039 Hypothyroidism, unspecified: Secondary | ICD-10-CM | POA: Diagnosis present

## 2020-03-08 DIAGNOSIS — Z743 Need for continuous supervision: Secondary | ICD-10-CM | POA: Diagnosis not present

## 2020-03-08 DIAGNOSIS — E1122 Type 2 diabetes mellitus with diabetic chronic kidney disease: Secondary | ICD-10-CM | POA: Diagnosis present

## 2020-03-08 DIAGNOSIS — Z79899 Other long term (current) drug therapy: Secondary | ICD-10-CM | POA: Diagnosis not present

## 2020-03-08 DIAGNOSIS — Z833 Family history of diabetes mellitus: Secondary | ICD-10-CM | POA: Diagnosis not present

## 2020-03-08 DIAGNOSIS — K921 Melena: Secondary | ICD-10-CM

## 2020-03-08 DIAGNOSIS — Z882 Allergy status to sulfonamides status: Secondary | ICD-10-CM

## 2020-03-08 DIAGNOSIS — M810 Age-related osteoporosis without current pathological fracture: Secondary | ICD-10-CM | POA: Diagnosis not present

## 2020-03-08 DIAGNOSIS — Z20822 Contact with and (suspected) exposure to covid-19: Secondary | ICD-10-CM | POA: Diagnosis present

## 2020-03-08 DIAGNOSIS — E785 Hyperlipidemia, unspecified: Secondary | ICD-10-CM | POA: Diagnosis not present

## 2020-03-08 DIAGNOSIS — M1990 Unspecified osteoarthritis, unspecified site: Secondary | ICD-10-CM | POA: Diagnosis not present

## 2020-03-08 DIAGNOSIS — I4819 Other persistent atrial fibrillation: Secondary | ICD-10-CM | POA: Diagnosis not present

## 2020-03-08 DIAGNOSIS — Z888 Allergy status to other drugs, medicaments and biological substances status: Secondary | ICD-10-CM

## 2020-03-08 DIAGNOSIS — Z950 Presence of cardiac pacemaker: Secondary | ICD-10-CM | POA: Diagnosis not present

## 2020-03-08 DIAGNOSIS — E1165 Type 2 diabetes mellitus with hyperglycemia: Secondary | ICD-10-CM | POA: Diagnosis not present

## 2020-03-08 DIAGNOSIS — I5032 Chronic diastolic (congestive) heart failure: Secondary | ICD-10-CM | POA: Diagnosis present

## 2020-03-08 DIAGNOSIS — N1832 Chronic kidney disease, stage 3b: Secondary | ICD-10-CM | POA: Diagnosis not present

## 2020-03-08 DIAGNOSIS — Z7982 Long term (current) use of aspirin: Secondary | ICD-10-CM

## 2020-03-08 DIAGNOSIS — R6889 Other general symptoms and signs: Secondary | ICD-10-CM | POA: Diagnosis not present

## 2020-03-08 DIAGNOSIS — Z79890 Hormone replacement therapy (postmenopausal): Secondary | ICD-10-CM

## 2020-03-08 DIAGNOSIS — F419 Anxiety disorder, unspecified: Secondary | ICD-10-CM | POA: Diagnosis present

## 2020-03-08 DIAGNOSIS — R58 Hemorrhage, not elsewhere classified: Secondary | ICD-10-CM | POA: Diagnosis not present

## 2020-03-08 DIAGNOSIS — Z8249 Family history of ischemic heart disease and other diseases of the circulatory system: Secondary | ICD-10-CM | POA: Diagnosis not present

## 2020-03-08 LAB — COMPREHENSIVE METABOLIC PANEL
ALT: 26 U/L (ref 0–44)
AST: 35 U/L (ref 15–41)
Albumin: 2.3 g/dL — ABNORMAL LOW (ref 3.5–5.0)
Alkaline Phosphatase: 79 U/L (ref 38–126)
Anion gap: 12 (ref 5–15)
BUN: 20 mg/dL (ref 8–23)
CO2: 23 mmol/L (ref 22–32)
Calcium: 7.9 mg/dL — ABNORMAL LOW (ref 8.9–10.3)
Chloride: 98 mmol/L (ref 98–111)
Creatinine, Ser: 1.21 mg/dL — ABNORMAL HIGH (ref 0.44–1.00)
GFR, Estimated: 44 mL/min — ABNORMAL LOW (ref >60)
Glucose, Bld: 235 mg/dL — ABNORMAL HIGH (ref 70–99)
Potassium: 4.4 mmol/L (ref 3.5–5.1)
Sodium: 133 mmol/L — ABNORMAL LOW (ref 135–145)
Total Bilirubin: 0.5 mg/dL (ref 0.3–1.2)
Total Protein: 5.9 g/dL — ABNORMAL LOW (ref 6.5–8.1)

## 2020-03-08 LAB — CBC WITH DIFFERENTIAL/PLATELET
Abs Immature Granulocytes: 0.33 10*3/uL — ABNORMAL HIGH (ref 0.00–0.07)
Basophils Absolute: 0.1 10*3/uL (ref 0.0–0.1)
Basophils Relative: 1 %
Eosinophils Absolute: 0 10*3/uL (ref 0.0–0.5)
Eosinophils Relative: 0 %
HCT: 30.6 % — ABNORMAL LOW (ref 36.0–46.0)
Hemoglobin: 9.4 g/dL — ABNORMAL LOW (ref 12.0–15.0)
Immature Granulocytes: 3 %
Lymphocytes Relative: 11 %
Lymphs Abs: 1.5 10*3/uL (ref 0.7–4.0)
MCH: 26.9 pg (ref 26.0–34.0)
MCHC: 30.7 g/dL (ref 30.0–36.0)
MCV: 87.7 fL (ref 80.0–100.0)
Monocytes Absolute: 1.9 10*3/uL — ABNORMAL HIGH (ref 0.1–1.0)
Monocytes Relative: 14 %
Neutro Abs: 9.3 10*3/uL — ABNORMAL HIGH (ref 1.7–7.7)
Neutrophils Relative %: 71 %
Platelets: 498 10*3/uL — ABNORMAL HIGH (ref 150–400)
RBC: 3.49 MIL/uL — ABNORMAL LOW (ref 3.87–5.11)
RDW: 14.8 % (ref 11.5–15.5)
WBC: 13.2 10*3/uL — ABNORMAL HIGH (ref 4.0–10.5)
nRBC: 0 % (ref 0.0–0.2)

## 2020-03-08 LAB — RESP PANEL BY RT-PCR (FLU A&B, COVID) ARPGX2
Influenza A by PCR: NEGATIVE
Influenza B by PCR: NEGATIVE
SARS Coronavirus 2 by RT PCR: NEGATIVE

## 2020-03-08 LAB — TYPE AND SCREEN
ABO/RH(D): AB NEG
Antibody Screen: NEGATIVE

## 2020-03-08 LAB — POC OCCULT BLOOD, ED: Fecal Occult Bld: POSITIVE — AB

## 2020-03-08 LAB — GLUCOSE, CAPILLARY: Glucose-Capillary: 96 mg/dL (ref 70–99)

## 2020-03-08 MED ORDER — ONDANSETRON HCL 4 MG/2ML IJ SOLN: 4.0000 mg | Freq: Four times a day (QID) | INTRAMUSCULAR | Status: DC | PRN

## 2020-03-08 MED ORDER — ADULT MULTIVITAMIN W/MINERALS CH
1.0000 | ORAL_TABLET | Freq: Every day | ORAL | Status: DC
  Administered 2020-03-09 – 2020-03-11 (×3): 1 via ORAL
  Filled 2020-03-08 (×3): qty 1

## 2020-03-08 MED ORDER — FUROSEMIDE 40 MG PO TABS
40.0000 mg | ORAL_TABLET | Freq: Every day | ORAL | Status: DC
  Administered 2020-03-09 – 2020-03-11 (×3): 40 mg via ORAL
  Filled 2020-03-08 (×3): qty 1

## 2020-03-08 MED ORDER — ATORVASTATIN CALCIUM 40 MG PO TABS
40.0000 mg | ORAL_TABLET | Freq: Every day | ORAL | Status: DC
  Administered 2020-03-08 – 2020-03-11 (×4): 40 mg via ORAL
  Filled 2020-03-08 (×4): qty 1

## 2020-03-08 MED ORDER — INSULIN ASPART 100 UNIT/ML ~~LOC~~ SOLN
0.0000 [IU] | Freq: Three times a day (TID) | SUBCUTANEOUS | Status: DC
  Administered 2020-03-09 – 2020-03-10 (×3): 2 [IU] via SUBCUTANEOUS
  Administered 2020-03-10: 3 [IU] via SUBCUTANEOUS
  Administered 2020-03-11: 1 [IU] via SUBCUTANEOUS
  Administered 2020-03-11: 2 [IU] via SUBCUTANEOUS

## 2020-03-08 MED ORDER — SODIUM CHLORIDE 0.9% FLUSH: 3.0000 mL | INTRAVENOUS | Status: DC | PRN

## 2020-03-08 MED ORDER — VANCOMYCIN 50 MG/ML ORAL SOLUTION
125.0000 mg | Freq: Four times a day (QID) | ORAL | Status: DC
  Administered 2020-03-08: 125 mg via ORAL
  Filled 2020-03-08 (×2): qty 2.5

## 2020-03-08 MED ORDER — ALBUTEROL SULFATE (2.5 MG/3ML) 0.083% IN NEBU: 2.5000 mg | INHALATION_SOLUTION | RESPIRATORY_TRACT | Status: DC | PRN

## 2020-03-08 MED ORDER — SODIUM CHLORIDE 0.9% FLUSH
3.0000 mL | Freq: Two times a day (BID) | INTRAVENOUS | Status: DC
  Administered 2020-03-08 – 2020-03-10 (×5): 3 mL via INTRAVENOUS

## 2020-03-08 MED ORDER — SODIUM CHLORIDE 0.9 % IV SOLN: 250.0000 mL | INTRAVENOUS | Status: DC | PRN

## 2020-03-08 MED ORDER — ACETAMINOPHEN 500 MG PO TABS
1000.0000 mg | ORAL_TABLET | Freq: Every day | ORAL | Status: DC
  Administered 2020-03-08 – 2020-03-11 (×4): 1000 mg via ORAL
  Filled 2020-03-08 (×4): qty 2

## 2020-03-08 MED ORDER — ONDANSETRON HCL 4 MG PO TABS: 4.0000 mg | ORAL_TABLET | Freq: Four times a day (QID) | ORAL | Status: DC | PRN

## 2020-03-08 MED ORDER — ACETAMINOPHEN 325 MG PO TABS: 650.0000 mg | ORAL_TABLET | Freq: Four times a day (QID) | ORAL | Status: DC | PRN

## 2020-03-08 MED ORDER — HYDROCODONE-ACETAMINOPHEN 5-325 MG PO TABS: 0.5000 | ORAL_TABLET | Freq: Four times a day (QID) | ORAL | Status: DC | PRN

## 2020-03-08 MED ORDER — INSULIN ASPART 100 UNIT/ML ~~LOC~~ SOLN: 0.0000 [IU] | Freq: Every day | SUBCUTANEOUS | Status: DC

## 2020-03-08 MED ORDER — ALBUTEROL SULFATE HFA 108 (90 BASE) MCG/ACT IN AERS: 2.0000 | INHALATION_SPRAY | Freq: Three times a day (TID) | RESPIRATORY_TRACT | Status: DC | PRN

## 2020-03-08 MED ORDER — ACETAMINOPHEN 650 MG RE SUPP: 650.0000 mg | Freq: Four times a day (QID) | RECTAL | Status: DC | PRN

## 2020-03-08 MED ORDER — LEVOTHYROXINE SODIUM 100 MCG PO TABS
100.0000 ug | ORAL_TABLET | Freq: Every day | ORAL | Status: DC
  Administered 2020-03-09 – 2020-03-11 (×3): 100 ug via ORAL
  Filled 2020-03-08 (×3): qty 1

## 2020-03-08 NOTE — ED Triage Notes (Signed)
Pt BIB EMS. Pt c/o blood in stool last night. Pt is currently taking antibiotics for c-diff. Pt lives at [facility] facility.    BP-168/64 Temp-98.3 HR-71 RR-16 O2-95% RA

## 2020-03-08 NOTE — Progress Notes (Signed)
Patient request that I speak to her daughter Rickard Rhymes) to provide an update on her hospitalization. Spoke to daughter at [phone]. Informed daughter about visitation guidelines. Will leave advance directive information at bedside for patient/daughter to complete tomorrow.

## 2020-03-08 NOTE — ED Provider Notes (Signed)
[facility] DEPT Provider Note   CSN: [id number] Arrival date & time: 03/08/20  1307     History Chief Complaint  Patient presents with  . Blood In Stools    Ann Fletcher is a 86 y.o. female with friend past medical history of hypertension, hyperlipidemia, hypothyroidism, CKD stage III, diabetes, chronic diastolic CHF, A. fib, arthritis that presents emergency department today for bloody stools.  Patient currently lives in [facility] facility.  Patient states that she has been having diarrhea due to recent diagnosis of C. difficile, is currently on antibiotics, she states that she does not know the name of them.  States that her caretakers noticed that she started having blood in her stools last night, she thinks that it was a lot however it is unable to quantify.  States that she continued to have bloody stools this morning therefore came here.  Patient has been on vancomycin for the past 12 days when she was diagnosed with C. difficile.  According to nursing staff at [facility], patient had not been currently complaining of anything, was supposed to stop taking vanc later this week.  States that she was doing very well, however this morning nursing staff did notice frank blood with blood clots in her diaper.  Denies any abdominal pain, nausea, vomiting.  Denies any history of GI bleed, no nausea or vomiting.  Denies any fevers, chills, URI symptoms.  States that she was in her routine health before this.  Patient is currently on Lovenox or DVT prophylaxsis. .  Per chart review, about a month ago patient was admitted to the hospital with mechanical fall, at that time hospital course was complicated with blood loss anemia.  Did require blood transfusion at that time.    HPI     Past Medical History:  Diagnosis Date  . Anxiety   . Arthritis   . AV node arrhythmia    a. s/p AV node ablation/PPM 07/2017.  [name] Chronic diastolic CHF (congestive heart failure)  ([place])    a. Acute exacerbation occurred in setting of AF.  [name] CKD (chronic kidney disease), stage III ([place])   . Diabetes mellitus without complication ([place])    type 2  . 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 H/O bladder infections   . Hyperlipidemia   . Hypertension   . Hypothyroidism   . Lower extremity edema   . Osteoporosis   . Persistent atrial fibrillation ([place])    a. diagnosed in 10/2014 by PCP, underwent failed TEE DCCV on 10/08/14, loaded with amio, successfully DCCV on 10/15/14.    Patient Active Problem List   Diagnosis Date Noted  . Hip fracture ([place]) 02/08/2020  . Fracture, proximal femur, right, closed, initial encounter ([place]) 02/07/2020  . Complete heart block ([place]) 12/17/2019  . CRI (chronic renal insufficiency), stage 4 (severe) ([place]) 04/16/2019  . Persistent atrial fibrillation ([place]) 12/02/2018  . Pacemaker 12/02/2018  . 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 Anemia 11/19/2017  . Anorexia 11/19/2017  . Interstitial lung disease ([place]) 11/07/2017  . Iron deficiency anemia   . Acute on chronic diastolic CHF (congestive heart failure) ([place])   . Status post atrioventricular nodal ablation 08/24/2017  . Chronic diastolic CHF (congestive heart failure) ([place]) 08/24/2017  . Type 2 diabetes mellitus with renal manifestations, controlled ([place]) 03/24/2015  . Essential hypertension 02/17/2014  . Hyperlipidemia 02/17/2014  . Lower extremity edema 02/17/2014  . Right carotid bruit 02/17/2014    Past Surgical History:  Procedure Laterality Date  .  AV NODE ABLATION N/A 08/23/2017   Procedure: AV NODE ABLATION;  Surgeon: [name], MD;  Location: [facility];  Service: Cardiovascular;  Laterality: N/A;  . BACK SURGERY  2001, 2003, 2006   x 3  . BIOPSY  11/06/2017   Procedure: BIOPSY;  Surgeon: [name], MD;  Location: [facility];  Service: Endoscopy;;  . CARDIOVERSION N/A 10/08/2014   Procedure: CARDIOVERSION;  Surgeon: [name], MD;   Location: [facility];  Service: Cardiovascular;  Laterality: N/A;  . CARDIOVERSION N/A 10/15/2014   Procedure: CARDIOVERSION;  Surgeon: [name], MD;  Location: [facility] ENDOSCOPY;  Service: Cardiovascular;  Laterality: N/A;  . CARDIOVERSION N/A 11/15/2014   Procedure: CARDIOVERSION;  Surgeon: [name], MD;  Location: [facility];  Service: Cardiovascular;  Laterality: N/A;  . COLONOSCOPY N/A 11/06/2017   Procedure: COLONOSCOPY;  Surgeon: [name], MD;  Location: [facility] ENDOSCOPY;  Service: Endoscopy;  Laterality: N/A;  . ESOPHAGOGASTRODUODENOSCOPY N/A 11/06/2017   Procedure: ESOPHAGOGASTRODUODENOSCOPY (EGD);  Surgeon: [name], MD;  Location: [facility] ENDOSCOPY;  Service: Endoscopy;  Laterality: N/A;  . INTRAMEDULLARY (IM) NAIL INTERTROCHANTERIC Right 02/08/2020   Procedure: INTRAMEDULLARY (IM) NAIL INTERTROCHANTRIC;  Surgeon: Hiram Gash, MD;  Location: [facility];  Service: Orthopedics;  Laterality: Right;  . PACEMAKER IMPLANT N/A 08/23/2017   Procedure: PACEMAKER IMPLANT;  Surgeon: [name], MD;  Location: [facility];  Service: Cardiovascular;  Laterality: N/A;  . POLYPECTOMY  11/06/2017   Procedure: POLYPECTOMY;  Surgeon: [name], MD;  Location: [facility] ENDOSCOPY;  Service: Endoscopy;;  . 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 TEE WITHOUT CARDIOVERSION N/A 10/08/2014   Procedure: TRANSESOPHAGEAL ECHOCARDIOGRAM (TEE);  Surgeon: [name], MD;  Location: [facility];  Service: Cardiovascular;  Laterality: N/A;  . TUBAL LIGATION       OB History   No obstetric history on file.     Family History  Problem Relation Age of Onset  . Pneumonia Mother   . Heart attack Father   . Diabetes Sister     Social History   Tobacco Use  . Smoking status: Never Smoker  . Smokeless tobacco: Never Used  Vaping Use  . Vaping Use: Never used  Substance Use Topics  . Alcohol use: No  . Drug use: No    Home Medications Prior to Admission medications   Medication Sig Start  Date End Date Taking? Authorizing Provider  acetaminophen (TYLENOL) 500 MG tablet Take 1,000 mg by mouth at bedtime.  11/01/19   [provider]  albuterol (PROVENTIL HFA;VENTOLIN HFA) 108 (90 BASE) MCG/ACT inhaler Inhale 2 puffs into the lungs 3 (three) times daily as needed for wheezing or shortness of breath.     [provider]  aspirin 81 MG chewable tablet Chew 81 mg by mouth daily.    [provider]  atorvastatin (LIPITOR) 40 MG tablet Take 40 mg by mouth at bedtime.     [provider]  Calcium Carbonate-Vitamin D (OSCAL 500/200 D-3 PO) Take 1 tablet by mouth daily.     [provider]  Cyanocobalamin (VITAMIN B 12 PO) Take 1 tablet by mouth daily.    [provider]  enoxaparin (LOVENOX) 30 MG/0.3ML injection Inject 0.3 mLs (30 mg total) into the skin daily. For DVT prophylaxis after surgery 02/10/20 03/11/20  Ethelda Chick, PA-C  fluticasone [facility]) 50 MCG/ACT nasal spray Place 1 spray into both nostrils daily as needed for allergies.  11/02/14   [provider]  furosemide (LASIX) 80 MG tablet TAKE ONE WHOLE TABLET (80MG ) IN THE MORNING AND HALF TABLET (40 MG) IN THE EVENING. Patient taking differently: Take 80 mg by mouth daily.  11/17/18   Almyra Deforest, PA  glimepiride (AMARYL) 2 MG tablet Take 2 mg by mouth every morning. 03/30/19   [provider]  HYDROcodone-acetaminophen (NORCO) 5-325 MG tablet Take 0.5-1 tablets by mouth every 6 (six) hours as needed for severe pain. 02/10/20   Ethelda Chick, PA-C  Lancet Devices (PRODIGY LANCING DEVICE) MISC See admin instructions. 11/23/19   [provider]  levothyroxine (SYNTHROID, LEVOTHROID) 100 MCG tablet Take 100 mcg by mouth daily before breakfast.  10/28/14   [provider]  potassium chloride SA (K-DUR,KLOR-CON) 20 MEQ tablet Take 20 mEq by mouth 2 (two) times daily.  01/06/18   Almyra Deforest, PA  Prodigy Twist Top Lancets 28G MISC CHECK BLOOD  GLUCOSE (SUGAR) TWICE DAILY 11/23/19   [provider]  vitamin E (VITAMIN E) 180 MG (400 UNITS) capsule Take 400 Units by mouth daily.     [provider]    Allergies    Tizanidine hcl, Nsaids, Pradaxa [dabigatran etexilate mesylate], Sulfa antibiotics, and Betadine [povidone iodine]  Review of Systems   Review of Systems  Constitutional: Negative for chills, diaphoresis, fatigue and fever.  HENT: Negative for congestion, sore throat and trouble swallowing.   Eyes: Negative for pain and visual disturbance.  Respiratory: Negative for cough, shortness of breath and wheezing.   Cardiovascular: Negative for chest pain, palpitations and leg swelling.  Gastrointestinal: Positive for blood in stool and diarrhea. Negative for abdominal distention, abdominal pain, nausea and vomiting.  Genitourinary: Negative for difficulty urinating.  Musculoskeletal: Negative for back pain, neck pain and neck stiffness.  Skin: Negative for pallor.  Neurological: Negative for dizziness, speech difficulty, weakness and headaches.  Psychiatric/Behavioral: Negative for confusion.    Physical Exam Updated Vital Signs BP 124/81   Pulse 69   Temp 98.5 F (36.9 C) (Oral)   Resp 18   SpO2 98%   Physical Exam Constitutional:      General: She is not in acute distress.    Appearance: Normal appearance. She is not ill-appearing, toxic-appearing or diaphoretic.     Comments: Patient is alert and oriented, in no acute distress  HENT:     Mouth/Throat:     Mouth: Mucous membranes are moist.     Pharynx: Oropharynx is clear.  Eyes:     General: No scleral icterus.    Extraocular Movements: Extraocular movements intact.     Pupils: Pupils are equal, round, and reactive to light.  Cardiovascular:     Rate and Rhythm: Normal rate and regular rhythm.     Pulses: Normal pulses.     Heart sounds: Normal heart sounds.  Pulmonary:     Effort: Pulmonary effort is normal. No respiratory distress.      Breath sounds: Normal breath sounds. No stridor. No wheezing, rhonchi or rales.  Chest:     Chest wall: No tenderness.  Abdominal:     General: Abdomen is flat. There is no distension.     Palpations: Abdomen is soft.     Tenderness: There is no abdominal tenderness. There is no guarding or rebound.  Genitourinary:    Comments: Chaperone present. Digital Rectal exam reveals sphincter with good tone.  Grossly bloody diarrhea and diaper with some blood clots noted, no external hemorrhoids, masses, or fissures.  Musculoskeletal:  General: No swelling or tenderness. Normal range of motion.     Cervical back: Normal range of motion and neck supple. No rigidity.     Right lower leg: No edema.     Left lower leg: No edema.  Skin:    General: Skin is warm and dry.     Capillary Refill: Capillary refill takes less than 2 seconds.     Coloration: Skin is not pale.  Neurological:     General: No focal deficit present.     Mental Status: She is alert and oriented to person, place, and time.  Psychiatric:        Mood and Affect: Mood normal.        Behavior: Behavior normal.     ED Results / Procedures / Treatments   Labs (all labs ordered are listed, but only abnormal results are displayed) Labs Reviewed  COMPREHENSIVE METABOLIC PANEL - Abnormal; Notable for the following components:      Result Value   Sodium 133 (*)    Glucose, Bld 235 (*)    Creatinine, Ser 1.21 (*)    Calcium 7.9 (*)    Total Protein 5.9 (*)    Albumin 2.3 (*)    GFR, Estimated 44 (*)    All other components within normal limits  CBC WITH DIFFERENTIAL/PLATELET - Abnormal; Notable for the following components:   WBC 13.2 (*)    RBC 3.49 (*)    Hemoglobin 9.4 (*)    HCT 30.6 (*)    Platelets 498 (*)    Neutro Abs 9.3 (*)    Monocytes Absolute 1.9 (*)    Abs Immature Granulocytes 0.33 (*)    All other components within normal limits  POC OCCULT BLOOD, ED - Abnormal; Notable for the following  components:   Fecal Occult Bld POSITIVE (*)    All other components within normal limits  TYPE AND SCREEN    EKG None  Radiology No results found.  Procedures Procedures (including critical care time)  Medications Ordered in ED Medications - No data to display  ED Course  I have reviewed the triage vital signs and the nursing notes.  Pertinent labs & imaging results that were available during my care of the patient were reviewed by me and considered in my medical decision making (see chart for details).    MDM Rules/Calculators/A&P                          Ann Fletcher is a 86 y.o. female with friend past medical history of hypertension, hyperlipidemia, hypothyroidism, CKD stage III, diabetes, chronic diastolic CHF, arthritis that presents emergency department today for bloody stools.  Patient with grossly bloody stool with blood clots mixed with diarrhea.  Denies any abdominal pain, patient is alert and oriented.  Blood pressure normal, normal heart rate.  Patient is on Lovenox, therefore I think patient will benefit for observation for questionable GI bleed on thinners.  This could also be irritation from colitis.  Hemoglobin stable at 9.4.  Gastroenterology aware, Dr. Therisa Doyne,.   415 spoke to hospitalist, Dr. Posey Pronto, who admit the patient.  The patient appears reasonably stabilized for admission considering the current resources, flow, and capabilities available in the ED at this time, and I doubt any other [facility] requiring further screening and/or treatment in the ED prior to admission.  I discussed this case with my attending physician who cosigned this note including patient's presenting symptoms, physical exam,  and planned diagnostics and interventions. Attending physician stated agreement with plan or made changes to plan which were implemented.   Attending physician assessed patient at bedside.  Final Clinical Impression(s) / ED Diagnoses Final diagnoses:  Hematochezia     Rx / DC Orders ED Discharge Orders    None       Alfredia Client, PA-C 03/08/20 1728    Truddie Hidden, MD 03/09/20 1401

## 2020-03-08 NOTE — H&P (Signed)
[facility] History and Physical   Patient: Ann Fletcher:[id number]   PCP: [name], MD DOB: 10/10/1932   DOA: 03/08/2020   DOS: 03/08/2020   DOS: the patient was seen and examined on 03/08/2020  Patient coming from: The patient is coming from SNF  Chief Complaint: BRBPR with clots  HPI: Ann Fletcher is a 86 y.o. female with Past medical history of HTN, HLD, hypothyroidism, CKD 3B, type II DM, chronic HFpEF, A. fib not on anticoagulation, recent hip fracture with surgery currently on DVT prophylaxis with Lovenox, recent C. difficile. Patient was brought in as she was found to have blood in the stool with clots for last 2 days. Patient recently was diagnosed with C. difficile colitis and has been on vancomycin for last 12 days. Patient had 1 due to diarrhea on a daily basis. Reportedly yesterday there was some notice of clots in her stool. This morning also this repeated and therefore she was brought to the hospital for further work-up. Recent hospitalized for hip fracture underwent replacement.  Postprocedure patient was on DVT prophylaxis with Lovenox which she is supposed to finish on 03/11/2020. Currently the time of my evaluation patient denies any complaints no nausea vomiting.  No chest pain.  No abdominal pain.  No headache no dizziness no lightheadedness.  She thinks that she is pale in color.  ED Course: Found to have positive Hemoccult, also had some clots in the bladder when they did the hemoglobin.  Hemoglobin stable.  Patient was referred for admission.  Review of Systems: as mentioned in the history of present illness.  All other systems reviewed and are negative.  Past Medical History:  Diagnosis Date  . Anxiety   . Arthritis   . AV node arrhythmia    a. s/p AV node ablation/PPM 07/2017.  [name] Chronic diastolic CHF (congestive heart failure) ([place])    a. Acute exacerbation occurred in setting of AF.  [name] CKD (chronic kidney disease), stage III ([place])   .  Diabetes mellitus without complication ([place])    type 2  . 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 H/O bladder infections   . Hyperlipidemia   . Hypertension   . Hypothyroidism   . Lower extremity edema   . Osteoporosis   . Persistent atrial fibrillation ([place])    a. diagnosed in 10/2014 by PCP, underwent failed TEE DCCV on 10/08/14, loaded with amio, successfully DCCV on 10/15/14.   Past Surgical History:  Procedure Laterality Date  . AV NODE ABLATION N/A 08/23/2017   Procedure: AV NODE ABLATION;  Surgeon: [name], MD;  Location: [place] [facility];  Service: Cardiovascular;  Laterality: N/A;  . BACK SURGERY  2001, 2003, 2006   x 3  . BIOPSY  11/06/2017   Procedure: BIOPSY;  Surgeon: [name], MD;  Location: [place];  Service: Endoscopy;;  . CARDIOVERSION N/A 10/08/2014   Procedure: CARDIOVERSION;  Surgeon: [name], MD;  Location: [place];  Service: Cardiovascular;  Laterality: N/A;  . CARDIOVERSION N/A 10/15/2014   Procedure: CARDIOVERSION;  Surgeon: [name], MD;  Location: [facility] ENDOSCOPY;  Service: Cardiovascular;  Laterality: N/A;  . CARDIOVERSION N/A 11/15/2014   Procedure: CARDIOVERSION;  Surgeon: [name], MD;  Location: [place];  Service: Cardiovascular;  Laterality: N/A;  . COLONOSCOPY N/A 11/06/2017   Procedure: COLONOSCOPY;  Surgeon: [name], MD;  Location: [facility] ENDOSCOPY;  Service: Endoscopy;  Laterality: N/A;  . ESOPHAGOGASTRODUODENOSCOPY N/A 11/06/2017   Procedure: ESOPHAGOGASTRODUODENOSCOPY (EGD);  Surgeon: [name], MD;  Location: [facility] ENDOSCOPY;  Service: Endoscopy;  Laterality: N/A;  . INTRAMEDULLARY (IM) NAIL INTERTROCHANTERIC Right 02/08/2020   Procedure: INTRAMEDULLARY (IM) NAIL INTERTROCHANTRIC;  Surgeon: Hiram Gash, MD;  Location: [facility];  Service: Orthopedics;  Laterality: Right;  . PACEMAKER IMPLANT N/A 08/23/2017   Procedure: PACEMAKER IMPLANT;  Surgeon: [name], MD;  Location: [place] [facility];  Service:  Cardiovascular;  Laterality: N/A;  . POLYPECTOMY  11/06/2017   Procedure: POLYPECTOMY;  Surgeon: [name], MD;  Location: [facility] ENDOSCOPY;  Service: Endoscopy;;  . 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 TEE WITHOUT CARDIOVERSION N/A 10/08/2014   Procedure: TRANSESOPHAGEAL ECHOCARDIOGRAM (TEE);  Surgeon: [name], MD;  Location: [place];  Service: Cardiovascular;  Laterality: N/A;  . TUBAL LIGATION     Social History:  reports that she has never smoked. She has never used smokeless tobacco. She reports that she does not drink alcohol and does not use drugs.  Allergies  Allergen Reactions  . Tizanidine Hcl Other (See Comments)    Dizziness, patient reports fall  . Nsaids Itching and Other (See Comments)    Stomach cramps   . Pradaxa [Dabigatran Etexilate Mesylate] Nausea And Vomiting  . Sulfa Antibiotics Nausea And Vomiting  . Betadine [Povidone Iodine] Itching and Rash   Family history reviewed and not pertinent Family History  Problem Relation Age of Onset  . Pneumonia Mother   . Heart attack Father   . Diabetes Sister      Prior to Admission medications   Medication Sig Start Date End Date Taking? Authorizing Provider  acetaminophen (TYLENOL) 500 MG tablet Take 1,000 mg by mouth at bedtime.  11/01/19  Yes [provider]  albuterol (PROVENTIL HFA;VENTOLIN HFA) 108 (90 BASE) MCG/ACT inhaler Inhale 2 puffs into the lungs 3 (three) times daily as needed for wheezing or shortness of breath.    Yes [provider]  aspirin 81 MG chewable tablet Chew 81 mg by mouth daily.   Yes [provider]  atorvastatin (LIPITOR) 40 MG tablet Take 40 mg by mouth at bedtime.    Yes [provider]  Calcium Carbonate-Vitamin D (OSCAL 500/200 D-3 PO) Take 1 tablet by mouth daily.    Yes [provider]  Cyanocobalamin (VITAMIN B 12 PO) Take 1,000 mcg by mouth daily.    Yes [provider]  enoxaparin (LOVENOX) 30 MG/0.3ML injection Inject  0.3 mLs (30 mg total) into the skin daily. For DVT prophylaxis after surgery 02/10/20 03/11/20 Yes McBane, Maylene Roes, PA-C  fluticasone (FLONASE) 50 MCG/ACT nasal spray Place 1 spray into both nostrils daily as needed for allergies.  11/02/14  Yes [provider]  furosemide (LASIX) 80 MG tablet TAKE ONE WHOLE TABLET (80MG ) IN THE MORNING AND HALF TABLET (40 MG) IN THE EVENING. Patient taking differently: Take 40-80 mg by mouth daily.  11/17/18  Yes Almyra Deforest, PA  glimepiride (AMARYL) 2 MG tablet Take 2 mg by mouth every morning. 03/30/19  Yes [provider]  HYDROcodone-acetaminophen (NORCO) 5-325 MG tablet Take 0.5-1 tablets by mouth every 6 (six) hours as needed for severe pain. 02/10/20  Yes McBane, Maylene Roes, PA-C  levothyroxine (SYNTHROID, LEVOTHROID) 100 MCG tablet Take 100 mcg by mouth daily before breakfast.  10/28/14  Yes [provider]  ondansetron (ZOFRAN) 4 MG tablet Take 4 mg by mouth every 6 (six) hours as needed for vomiting or nausea/vomiting. 02/15/20  Yes [provider]  potassium chloride SA (  K-DUR,KLOR-CON) 20 MEQ tablet Take 20 mEq by mouth 2 (two) times daily.  01/06/18  Yes Almyra Deforest, PA  vancomycin (VANCOCIN) 125 MG capsule Take 125 mg by mouth 4 (four) times daily. 03/01/20  Yes [provider]  vitamin E (VITAMIN E) 180 MG (400 UNITS) capsule Take 400 Units by mouth daily.    Yes [provider]  Lancet Devices (PRODIGY LANCING DEVICE) MISC See admin instructions. 11/23/19   [provider]  Prodigy Twist Top Lancets 28G MISC CHECK BLOOD GLUCOSE (SUGAR) TWICE DAILY 11/23/19   [provider]    Physical Exam: Vitals:   03/08/20 1753 03/08/20 1806 03/08/20 1848 03/08/20 2014  BP:   (!) 150/58   Pulse: 69 71 70   Resp:   20   Temp:   98.6 F (37 C)   TempSrc:   Oral   SpO2: 97% 100% 97%   Weight:    62.1 kg  Height:    5\' 1"  (1.549 m)    General: alert and oriented to time, place, and person.  Appear in moderate distress, affect appropriate Eyes: PERRL, Conjunctiva normal ENT: Oral Mucosa Clear, moist  Neck: no JVD, no Abnormal Mass Or lumps Cardiovascular: S1 and S2 Present, no Murmur, peripheral pulses symmetrical Respiratory: good respiratory effort, Bilateral Air entry equal and Decreased, no signs of accessory muscle use, Clear to Auscultation, no Crackles, no wheezes Abdomen: Bowel Sound present, Soft and no tenderness, no hernia Skin: no rashes  Extremities: bilateral  Pedal edema, no calf tenderness Neurologic: without any new focal findings Gait not checked due to patient safety concerns  Data Reviewed: I have personally reviewed and interpreted labs, imaging as discussed below.  CBC: Recent Labs  Lab 03/08/20 1420  WBC 13.2*  NEUTROABS 9.3*  HGB 9.4*  HCT 30.6*  MCV 87.7  PLT 366*   Basic Metabolic Panel: Recent Labs  Lab 03/08/20 1420  NA 133*  K 4.4  CL 98  CO2 23  GLUCOSE 235*  BUN 20  CREATININE 1.21*  CALCIUM 7.9*   GFR: Estimated Creatinine Clearance: 28.7 mL/min (A) (by C-G formula based on SCr of 1.21 mg/dL (H)). Liver Function Tests: Recent Labs  Lab 03/08/20 1420  AST 35  ALT 26  ALKPHOS 79  BILITOT 0.5  PROT 5.9*  ALBUMIN 2.3*   No results for input(s): LIPASE, AMYLASE in the last 168 hours. No results for input(s): AMMONIA in the last 168 hours. Coagulation Profile: No results for input(s): INR, PROTIME in the last 168 hours. Cardiac Enzymes: No results for input(s): CKTOTAL, CKMB, CKMBINDEX, TROPONINI in the last 168 hours. BNP (last 3 results) No results for input(s): PROBNP in the last 8760 hours. HbA1C: No results for input(s): HGBA1C in the last 72 hours. CBG: No results for input(s): GLUCAP in the last 168 hours. Lipid Profile: No results for input(s): CHOL, HDL, LDLCALC, TRIG, CHOLHDL, LDLDIRECT in the last 72 hours. Thyroid Function Tests: No results for input(s): TSH, T4TOTAL, FREET4, T3FREE, THYROIDAB in the  last 72 hours. Anemia Panel: No results for input(s): VITAMINB12, FOLATE, FERRITIN, TIBC, IRON, RETICCTPCT in the last 72 hours. Urine analysis:    Component Value Date/Time   COLORURINE YELLOW 02/10/2020 [place] 02/10/2020 1514   LABSPEC 1.015 02/10/2020 [place] 5.0 02/10/2020 [place] 02/10/2020 1514   HGBUR NEGATIVE 02/10/2020 [place] 02/10/2020 [place] 02/10/2020 1514   PROTEINUR NEGATIVE 02/10/2020 1514  UROBILINOGEN 0.2 10/22/2010 1936   NITRITE NEGATIVE 02/10/2020 1514   LEUKOCYTESUR NEGATIVE 02/10/2020 1514   Radiological Exams on Admission: No results found. Echocardiogram: 2019 EF 60 to 65%  I reviewed all nursing notes, pharmacy notes, vitals, pertinent old records.  Assessment/Plan 1.  BRBPR Likely combination of C. difficile colitis and DVT prophylaxis. Patient was supposed to be elevated prophylaxis until 03/11/2020. Since last 12 days suffering from C. difficile colitis. Likely combination of both causing irritation enough to cause bleeding. Hemoglobin currently stable. Continue clear liquid diet. GI was consulted by ER. Monitor H&H.  SCD.  2.  Recent C. difficile colitis Completed 12 days of oral vancomycin.  We will continue vancomycin for 3 more days. Monitor for diarrhea frequency.  May require longer course if diarrhea frequency is worsening. No abdominal pain on examination.  No evidence of toxic megacolon on examination.  3.  Chronic kidney disease stage IIIa. Renal function stable. Baseline around 1.3/1.4. Currently 1.21 on admission on admission.  4.  Paroxysmal A. fib-rate controlled. Essential hypertension. SP pacemaker implant. No other anticoagulation.  Monitor. Continue current medication.  5.  Type of diabetes mellitus, uncontrolled with hyperglycemia. On glipizide at home. Currently on sensitive sliding scale.  Monitor.  6.  Leukocytosis. Likely  stress reaction. Monitor.  Nutrition: Clear liquid diet DVT Prophylaxis: SCD, pharmacological prophylaxis contraindicated due to GI bleed  Advance goals of care discussion: Full code patient would like to remain full code.  Her husband and her daughter will be her POA.  Consults: EDP consulted GI on call.  Family Communication: no family was present at bedside, at the time of interview.  Patient wanted me to discuss with her daughter although unable to reach her daughter on the phone. Left voicemail.  Disposition:  From: SNF Likely will need SNF on discharge.   Author: Berle Mull, MD [facility] 03/08/2020 8:17 PM   To reach On-call, see care teams to locate the attending and reach out to them via [email or web address]. If 7PM-7AM, please contact night-coverage If you still have difficulty reaching the attending provider, please page the [facility] (Director on Call) for [facility] on amion for assistance.

## 2020-03-08 NOTE — Plan of Care (Signed)
  Problem: Education: Goal: Knowledge of General Education information will improve Description Including pain rating scale, medication(s)/side effects and non-pharmacologic comfort measures Outcome: Progressing   

## 2020-03-09 DIAGNOSIS — K921 Melena: Secondary | ICD-10-CM | POA: Diagnosis not present

## 2020-03-09 DIAGNOSIS — A0472 Enterocolitis due to Clostridium difficile, not specified as recurrent: Principal | ICD-10-CM

## 2020-03-09 LAB — CBC
HCT: 31.6 % — ABNORMAL LOW (ref 36.0–46.0)
Hemoglobin: 9.7 g/dL — ABNORMAL LOW (ref 12.0–15.0)
MCH: 27.2 pg (ref 26.0–34.0)
MCHC: 30.7 g/dL (ref 30.0–36.0)
MCV: 88.8 fL (ref 80.0–100.0)
Platelets: 417 10*3/uL — ABNORMAL HIGH (ref 150–400)
RBC: 3.56 MIL/uL — ABNORMAL LOW (ref 3.87–5.11)
RDW: 15 % (ref 11.5–15.5)
WBC: 12.1 10*3/uL — ABNORMAL HIGH (ref 4.0–10.5)
nRBC: 0 % (ref 0.0–0.2)

## 2020-03-09 LAB — COMPREHENSIVE METABOLIC PANEL
ALT: 23 U/L (ref 0–44)
AST: 28 U/L (ref 15–41)
Albumin: 2.3 g/dL — ABNORMAL LOW (ref 3.5–5.0)
Alkaline Phosphatase: 71 U/L (ref 38–126)
Anion gap: 11 (ref 5–15)
BUN: 20 mg/dL (ref 8–23)
CO2: 25 mmol/L (ref 22–32)
Calcium: 8.1 mg/dL — ABNORMAL LOW (ref 8.9–10.3)
Chloride: 100 mmol/L (ref 98–111)
Creatinine, Ser: 1.08 mg/dL — ABNORMAL HIGH (ref 0.44–1.00)
GFR, Estimated: 50 mL/min — ABNORMAL LOW (ref >60)
Glucose, Bld: 95 mg/dL (ref 70–99)
Potassium: 4.4 mmol/L (ref 3.5–5.1)
Sodium: 136 mmol/L (ref 135–145)
Total Bilirubin: 0.8 mg/dL (ref 0.3–1.2)
Total Protein: 5.9 g/dL — ABNORMAL LOW (ref 6.5–8.1)

## 2020-03-09 LAB — GLUCOSE, CAPILLARY
Glucose-Capillary: 82 mg/dL (ref 70–99)
Glucose-Capillary: 168 mg/dL — ABNORMAL HIGH (ref 70–99)
Glucose-Capillary: 151 mg/dL — ABNORMAL HIGH (ref 70–99)
Glucose-Capillary: 190 mg/dL — ABNORMAL HIGH (ref 70–99)

## 2020-03-09 LAB — PROTIME-INR
INR: 1.2 (ref 0.8–1.2)
Prothrombin Time: 14.5 seconds (ref 11.4–15.2)

## 2020-03-09 LAB — HEMOGLOBIN A1C
Hgb A1c MFr Bld: 6.8 % — ABNORMAL HIGH (ref 4.8–5.6)
Mean Plasma Glucose: 148.46 mg/dL

## 2020-03-09 MED ORDER — VANCOMYCIN 50 MG/ML ORAL SOLUTION
250.0000 mg | Freq: Four times a day (QID) | ORAL | Status: DC
  Administered 2020-03-09 – 2020-03-10 (×5): 250 mg via ORAL
  Filled 2020-03-09 (×5): qty 5

## 2020-03-09 MED ORDER — CHLORHEXIDINE GLUCONATE CLOTH 2 % EX PADS
6.0000 | MEDICATED_PAD | Freq: Every day | CUTANEOUS | Status: DC
  Administered 2020-03-09 – 2020-03-11 (×3): 6 via TOPICAL

## 2020-03-09 MED ORDER — VANCOMYCIN 50 MG/ML ORAL SOLUTION
500.0000 mg | Freq: Four times a day (QID) | ORAL | Status: DC
  Filled 2020-03-09 (×2): qty 10

## 2020-03-09 NOTE — Evaluation (Signed)
Occupational Therapy Evaluation Patient Details Name: Ann Fletcher MRN: [id number] DOB: Aug 15, 1932 Today's Date: 03/09/2020    History of Present Illness Ann Fletcher is a 86 y.o. female with Past medical history of HTN, HLD, hypothyroidism, CKD 3B, type II DM, chronic HFpEF, A. fib and  recent C. difficile. Underwent IM Nail R femur 02/08/20. WBAT   Clinical Impression   Ann Fletcher is an 86 year old woman who presents with rectal bleeding and recent history of C.dif and right hip fracture. Patient has been at local SNF since prior discharge and reports limited mobility and predominantly bed level ADLs due to recurrent and frequent diarrhea. On today's evaluation patient presents with generalized weakness, decreased activity tolerance, impaired balance, and decrease ROM and strength of RLE. Patient mod assist to transfer to side of bed, mod assist to stand from elevated bed height and to pivot to recliner with use of RW. Patient unable to come into erect standing and unable to use UEs effectively on RW. Patient took two steps with therapist facilitating with tactile cues. Patient setup for UB ADLs and max-total assist for LB ADLs. Patient will benefit from skilled OT services while in hospital to improve deficits and learn compensatory strategies as needed in order to return PLOF.  Recommend return to rehab to continue to improve deficits.     Follow Up Recommendations  SNF    Equipment Recommendations  None recommended by OT    Recommendations for Other Services       Precautions / Restrictions Precautions Precautions: Fall Restrictions Weight Bearing Restrictions: No Other Position/Activity Restrictions: WBAT      Mobility Bed Mobility Overal bed mobility: Needs Assistance Bed Mobility: Supine to Sit;Rolling Rolling: Min assist   Supine to sit: Mod assist     General bed mobility comments: Mod assist for RLE and trunk lift off. Patient assisting with use of bed rails  and LLE.    Transfers Overall transfer level: Needs assistance Equipment used: Rolling walker (2 wheeled) Transfers: Sit to/from [facility] Sit to Stand: From elevated surface;Mod assist Stand pivot transfers: Mod assist            Balance Overall balance assessment: Needs assistance Sitting-balance support: No upper extremity supported;Feet supported Sitting balance-Leahy Scale: Fair Sitting balance - Comments: Able to sit edge of bed.   Standing balance support: During functional activity;Bilateral upper extremity supported Standing balance-Leahy Scale: Poor Standing balance comment: Reliance on upper extremities and external support                           ADL either performed or assessed with clinical judgement   ADL Overall ADL's : Needs assistance/impaired Eating/Feeding: Set up;Sitting   Grooming: Set up;Sitting   Upper Body Bathing: Set up;Sitting   Lower Body Bathing: Maximal assistance;Bed level   Upper Body Dressing : Set up;Sitting   Lower Body Dressing: Total assistance;Bed level   Toilet Transfer: Squat-pivot;BSC;RW Armed forces technical officer Details (indicate cue type and reason): Simulated with pivot to recliner. Unable to fully come into standing. Not using UEs effectively on walker. Toileting- Clothing Manipulation and Hygiene: Total assistance;Sit to/from stand               Vision Patient Visual Report: No change from baseline       Perception     Praxis      Pertinent Vitals/Pain Pain Assessment: No/denies pain     Hand Dominance Right  Extremity/Trunk Assessment Upper Extremity Assessment Upper Extremity Assessment: Generalized weakness   Lower Extremity Assessment Lower Extremity Assessment: Defer to PT evaluation   Cervical / Trunk Assessment Cervical / Trunk Assessment: Kyphotic   Communication Communication Communication: HOH   Cognition Arousal/Alertness: Awake/alert Behavior During Therapy:  WFL for tasks assessed/performed Overall Cognitive Status: Within Functional Limits for tasks assessed                                     General Comments       Exercises     Shoulder Instructions      Home Living Family/patient expects to be discharged to:: Skilled nursing facility                                        Prior Functioning/Environment Level of Independence: Needs assistance  Gait / Transfers Assistance Needed: PTA to hip fx MOd I with rollator. Reports minimal standing and ambulation at rehab - due to freqent watery stoolds. Reports mostly bed exercise. ADL's / Homemaking Assistance Needed: PTA - daily aide for several hours. Needs assistance for all ADLs - predominantly limited to bed level. Communication / Swallowing Assistance Needed: HOH          OT Problem List: Decreased strength;Decreased activity tolerance;Impaired balance (sitting and/or standing);Decreased knowledge of use of DME or AE      OT Treatment/Interventions: Self-care/ADL training;Therapeutic exercise;DME and/or AE instruction;Therapeutic activities;Balance training;Patient/family education    OT Goals(Current goals can be found in the care plan section) Acute Rehab OT Goals Patient Stated Goal: To walk and go home to husband OT Goal Formulation: With patient Time For Goal Achievement: 03/23/20 Potential to Achieve Goals: Good  OT Frequency: Min 2X/week   Barriers to D/C:            Co-evaluation              AM-PAC OT "6 Clicks" Daily Activity     Outcome Measure Help from another person eating meals?: A Little Help from another person taking care of personal grooming?: A Little Help from another person toileting, which includes using toliet, bedpan, or urinal?: Total Help from another person bathing (including washing, rinsing, drying)?: A Lot Help from another person to put on and taking off regular upper body clothing?: A Little Help from  another person to put on and taking off regular lower body clothing?: Total 6 Click Score: 13   End of Session Equipment Utilized During Treatment: Gait belt;Rolling walker Nurse Communication: Mobility status  Activity Tolerance: Patient tolerated treatment well Patient left: in chair;with call bell/phone within reach;with chair alarm set  OT Visit Diagnosis: Other abnormalities of gait and mobility (R26.89);Muscle weakness (generalized) (M62.81);Unsteadiness on feet (R26.81)                Time: 9326-7124 OT Time Calculation (min): 23 min Charges:  OT General Charges $OT Visit: 1 Visit OT Evaluation $OT Eval Moderate Complexity: 1 Mod  Darvin Dials, OTR/L [place]  Office [phone] Pager: [place] 03/09/2020, 9:04 AM

## 2020-03-09 NOTE — TOC Progression Note (Signed)
Transition of Care [facility]) - Progression Note    Patient Details  Name: Ann Fletcher MRN: [id number] Date of Birth: 1932/08/16  [facility] CM/SW Contact  Purcell Mouton, RN Phone Number: 03/09/2020, 1:42 PM  Clinical Narrative:    Pt was at [facility] Short term. Spoke with pt's son Sonia Side [phone] [facility] who asked that his mother go to [facility] [address], [place], [place], [phone]).    Expected Discharge Plan: [place] Barriers to Discharge: No Barriers Identified  Expected Discharge Plan and Services Expected Discharge Plan: [place] Choice: Nursing Home Living arrangements for the past 2 months: [place]                                       Social Determinants of Health (SDOH) Interventions    Readmission Risk Interventions Readmission Risk Prevention Plan 02/09/2020  Transportation Screening Complete  PCP or Specialist Appt within 5-7 Days Complete  Medication Review (RN CM) Complete  Some recent data might be hidden

## 2020-03-09 NOTE — Plan of Care (Signed)

## 2020-03-09 NOTE — Progress Notes (Addendum)
PROGRESS NOTE  Ann Fletcher UKG:[id number] DOB: 02/17/1933 DOA: 03/08/2020 PCP: [name], MD   LOS: 0 days   Brief Narrative / Interim history: 86 year old female with history of HTN, HLD, hypothyroidism, chronic kidney disease stage IIIb, DM 2, chronic diastolic CHF, A. fib not on anticoagulation, recent hip fracture 3 weeks ago currently in [facility] for rehab comes into the hospital with persistent diarrhea as well as blood in the stool.  She apparently was diagnosed with C. difficile colitis and has been on vancomycin for the last 12 days however has been having persistent ongoing diarrhea on a daily basis and now with bright red blood.  Subjective / 24h Interval events: She is doing well this morning, states that her bleeding has resolved but still has ongoing diarrhea and she is very distraught by that  Assessment & Plan: Principal Problem C. difficile colitis with lower GI bleed -Patient has had C. difficile diarrhea for the past 4 days, not improving and now with bleeding episodes.  GI consulted and appreciate input, discussed with Dr. Therisa Doyne.  Vancomycin dose increased to 50 every 6 hours, continue to monitor.  Likely needs 14 additional days -Hemoglobin stable, bleeding resolved  Active Problems Chronic kidney disease stage IIIa -Baseline creatinine 1.3-1.4, currently at baseline.  Paroxysmal A. fib, uncontrolled A. fib status post AV node ablation and insertion of pacemaker -Not on anticoagulation due to falls  Hyperlipidemia -Continue statin  Hypothyroidism -Continue Synthroid  DM2 -Continue sliding scale   CBG (last 3)  Recent Labs    03/08/20 2135 03/09/20 0725 03/09/20 1157  GLUCAP 96 82 168*    Scheduled Meds: . acetaminophen  1,000 mg Oral QHS  . atorvastatin  40 mg Oral QHS  . Chlorhexidine Gluconate Cloth  6 each Topical Daily  . furosemide  40 mg Oral Daily  . insulin aspart  0-5 Units Subcutaneous QHS  . insulin aspart  0-9  Units Subcutaneous TID WC  . levothyroxine  100 mcg Oral Q0600  . multivitamin with minerals  1 tablet Oral Daily  . sodium chloride flush  3 mL Intravenous Q12H  . vancomycin  250 mg Oral Q6H   Continuous Infusions: . sodium chloride     PRN Meds:.sodium chloride, acetaminophen **OR** acetaminophen, albuterol, HYDROcodone-acetaminophen, ondansetron **OR** ondansetron (ZOFRAN) IV, sodium chloride flush  Diet Orders (From admission, onward)    Start     Ordered   03/08/20 1846  Diet clear liquid Room service appropriate? Yes; Fluid consistency: Thin  Diet effective now       Question Answer Comment  Room service appropriate? Yes   Fluid consistency: Thin      03/08/20 1846          DVT prophylaxis: SCDs Start: 03/08/20 1846 Place TED hose Start: 03/08/20 1846     Code Status: Full Code  Family Communication: Discussed with patient  Status is: Observation  The patient will require care spanning > 2 midnights and should be moved to inpatient because: Persistent diarrhea in the setting of persistent C. difficile colitis, monitoring for recurrent bleeding  Dispo: The patient is from: SNF              Anticipated d/c is to: SNF              Anticipated d/c date is: 2 days              Patient currently is not medically stable to d/c.  Consultants:  Gastroenterology  Procedures:  None  Microbiology  None   Antimicrobials: Vancomycin po     Objective: Vitals:   03/08/20 1848 03/08/20 2014 03/08/20 2138 03/09/20 0525  BP: (!) 150/58  (!) 146/52 (!) 159/72  Pulse: 70  70 69  Resp: 20  19 19   Temp: 98.6 F (37 C)  98 F (36.7 C) 97.8 F (36.6 C)  TempSrc: Oral  Oral Oral  SpO2: 97%  95% 97%  Weight:  62.1 kg    Height:  5\' 1"  (1.549 m)      Intake/Output Summary (Last 24 hours) at 03/09/2020 1412 Last data filed at 03/09/2020 0200 Gross per 24 hour  Intake 240 ml  Output 625 ml  Net -385 ml   Filed Weights   03/08/20 2014  Weight: 62.1 kg     Examination:  Constitutional: NAD Eyes: no scleral icterus ENMT: Mucous membranes are moist.  Neck: normal, supple Respiratory: clear to auscultation bilaterally, no wheezing, no crackles.  Cardiovascular: Regular rate and rhythm, no murmurs / rubs / gallops. No LE edema.  Abdomen: non distended, no tenderness. Bowel sounds positive.  Musculoskeletal: no clubbing / cyanosis.  Skin: no rashes Neurologic: non focal   Data Reviewed: I have independently reviewed following labs and imaging studies   CBC: Recent Labs  Lab 03/08/20 1420 03/09/20 0442  WBC 13.2* 12.1*  NEUTROABS 9.3*  --   HGB 9.4* 9.7*  HCT 30.6* 31.6*  MCV 87.7 88.8  PLT 498* 962*   Basic Metabolic Panel: Recent Labs  Lab 03/08/20 1420 03/09/20 0442  NA 133* 136  K 4.4 4.4  CL 98 100  CO2 23 25  GLUCOSE 235* 95  BUN 20 20  CREATININE 1.21* 1.08*  CALCIUM 7.9* 8.1*   Liver Function Tests: Recent Labs  Lab 03/08/20 1420 03/09/20 0442  AST 35 28  ALT 26 23  ALKPHOS 79 71  BILITOT 0.5 0.8  PROT 5.9* 5.9*  ALBUMIN 2.3* 2.3*   Coagulation Profile: Recent Labs  Lab 03/09/20 0442  INR 1.2   HbA1C: Recent Labs    03/09/20 0442  HGBA1C 6.8*   CBG: Recent Labs  Lab 03/08/20 2135 03/09/20 0725 03/09/20 1157  GLUCAP 96 82 168*    Recent Results (from the past 240 hour(s))  Resp Panel by RT-PCR (Flu A&B, Covid) Nasopharyngeal Swab     Status: None   Collection Time: 03/08/20  6:02 PM   Specimen: Nasopharyngeal Swab; Nasopharyngeal(NP) swabs in vial transport medium  Result Value Ref Range Status   SARS Coronavirus 2 by RT PCR NEGATIVE NEGATIVE Final    Comment: (NOTE) SARS-CoV-2 target nucleic acids are NOT DETECTED.  The SARS-CoV-2 RNA is generally detectable in upper respiratory specimens during the acute phase of infection. The lowest concentration of SARS-CoV-2 viral copies this assay can detect is 138 copies/mL. A negative result does not preclude SARS-Cov-2 infection and  should not be used as the sole basis for treatment or other patient management decisions. A negative result may occur with  improper specimen collection/handling, submission of specimen other than nasopharyngeal swab, presence of viral mutation(s) within the areas targeted by this assay, and inadequate number of viral copies(<138 copies/mL). A negative result must be combined with clinical observations, patient history, and epidemiological information. The expected result is Negative.  Fact Sheet for Patients:  EntrepreneurPulse.com.au  Fact Sheet for Healthcare Providers:  IncredibleEmployment.be  This test is no t yet approved or cleared by the [place] FDA and  has been authorized for detection and/or diagnosis of  SARS-CoV-2 by FDA under an Emergency Use Authorization (EUA). This EUA will remain  in effect (meaning this test can be used) for the duration of the COVID-19 declaration under Section 564(b)(1) of the Act, 21 U.S.C.section 360bbb-3(b)(1), unless the authorization is terminated  or revoked sooner.       Influenza A by PCR NEGATIVE NEGATIVE Final   Influenza B by PCR NEGATIVE NEGATIVE Final    Comment: (NOTE) The Xpert Xpress SARS-CoV-2/FLU/RSV plus assay is intended as an aid in the diagnosis of influenza from Nasopharyngeal swab specimens and should not be used as a sole basis for treatment. Nasal washings and aspirates are unacceptable for Xpert Xpress SARS-CoV-2/FLU/RSV testing.  Fact Sheet for Patients: EntrepreneurPulse.com.au  Fact Sheet for Healthcare Providers: IncredibleEmployment.be  This test is not yet approved or cleared by the [place] FDA and has been authorized for detection and/or diagnosis of SARS-CoV-2 by FDA under an Emergency Use Authorization (EUA). This EUA will remain in effect (meaning this test can be used) for the duration of the COVID-19 declaration  under Section 564(b)(1) of the Act, 21 U.S.C. section 360bbb-3(b)(1), unless the authorization is terminated or revoked.  Performed at [facility], [place] [address] [place], [place] [address]      Radiology Studies: No results found.   Marzetta Board, MD, PhD [facility]  Between 7 am - 7 pm I am available, please contact me via Amion or Securechat  Between 7 pm - 7 am I am not available, please contact night coverage MD/APP via Amion

## 2020-03-09 NOTE — Consult Note (Signed)
Referring Provider: [facility] Primary Care Physician:  [name], MD Primary Gastroenterologist:  Althia Forts (Dr. Allyn Kenner)  Reason for Consultation:  Hematochezia  HPI: Ann Fletcher is a 86 y.o. female with past medical history to include recent hip fracture/IM nail (11/8) on Lovenox 30 mg daily, current C. difficile infection, heart failure, CKD, and A. fib presenting for consultation of hematochezia.  Patient reports persistent diarrhea for approximately 2 weeks.  She was diagnosed with C. difficile and has been on vancomycin 125 mg 4 times per day.  She states she still has at least 2-3 liquid stools per day, often accompanied by fecal incontinence.  She does not feel as if the diarrhea is improving.  She also notes 1 day last week in which she had an episode of rectal bleeding.  She states she had bright red blood mixed with the diarrhea.  She is unable to quantify the amount of blood but states it looked like a lot.  Bleeding was self-limited and she has not noticed any bleeding since that time.  She denies any abdominal pain, nausea, vomiting, hematemesis, dysphagia, GERD, changes in appetite, unexplained weight loss.  Patient has been on Lovenox 30 mg daily since discharge 11/11 for DVT prophylaxis.  Denies any aspirin or NSAID use.  Denies any family history of colon cancer or gastrointestinal malignancy.  Last bowel movement was last night, liquid and brown.  Colonoscopy 10/2017: One 6 mm polyp in the distal sigmoid colon (bx: hyperplastic polyp), minimal diverticulosis, internal hemorrhoids  EGD 10/2017: Mild gastritis (bx: chronic inactive gastritis, negative for H pylori, dysplasia, or malignancy), otherwise normal.  Normal duodenal bx.  Past Medical History:  Diagnosis Date  . Anxiety   . Arthritis   . AV node arrhythmia    a. s/p AV node ablation/PPM 07/2017.  [name] Chronic diastolic CHF (congestive heart failure) ([place])    a. Acute exacerbation occurred in setting of AF.  [name]  CKD (chronic kidney disease), stage III ([place])   . Diabetes mellitus without complication ([place])    type 2  . 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 H/O bladder infections   . Hyperlipidemia   . Hypertension   . Hypothyroidism   . Lower extremity edema   . Osteoporosis   . Persistent atrial fibrillation ([place])    a. diagnosed in 10/2014 by PCP, underwent failed TEE DCCV on 10/08/14, loaded with amio, successfully DCCV on 10/15/14.    Past Surgical History:  Procedure Laterality Date  . AV NODE ABLATION N/A 08/23/2017   Procedure: AV NODE ABLATION;  Surgeon: [name], MD;  Location: [place] [facility];  Service: Cardiovascular;  Laterality: N/A;  . BACK SURGERY  2001, 2003, 2006   x 3  . BIOPSY  11/06/2017   Procedure: BIOPSY;  Surgeon: [name], MD;  Location: [place];  Service: Endoscopy;;  . CARDIOVERSION N/A 10/08/2014   Procedure: CARDIOVERSION;  Surgeon: [name], MD;  Location: [place];  Service: Cardiovascular;  Laterality: N/A;  . CARDIOVERSION N/A 10/15/2014   Procedure: CARDIOVERSION;  Surgeon: [name], MD;  Location: [facility];  Service: Cardiovascular;  Laterality: N/A;  . CARDIOVERSION N/A 11/15/2014   Procedure: CARDIOVERSION;  Surgeon: [name], MD;  Location: [place];  Service: Cardiovascular;  Laterality: N/A;  . COLONOSCOPY N/A 11/06/2017   Procedure: COLONOSCOPY;  Surgeon: [name], MD;  Location: [facility] ENDOSCOPY;  Service: Endoscopy;  Laterality: N/A;  . ESOPHAGOGASTRODUODENOSCOPY N/A 11/06/2017   Procedure: ESOPHAGOGASTRODUODENOSCOPY (EGD);  Surgeon: Lyndel Safe,  Peyton Bottoms, MD;  Location: [place];  Service: Endoscopy;  Laterality: N/A;  . INTRAMEDULLARY (IM) NAIL INTERTROCHANTERIC Right 02/08/2020   Procedure: INTRAMEDULLARY (IM) NAIL INTERTROCHANTRIC;  Surgeon: Hiram Gash, MD;  Location: [facility];  Service: Orthopedics;  Laterality: Right;  . PACEMAKER IMPLANT N/A 08/23/2017   Procedure: PACEMAKER IMPLANT;  Surgeon: [name], MD;  Location: [place] [facility];  Service: Cardiovascular;  Laterality: N/A;  . POLYPECTOMY  11/06/2017   Procedure: POLYPECTOMY;  Surgeon: [name], MD;  Location: [facility] ENDOSCOPY;  Service: Endoscopy;;  . 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 TEE WITHOUT CARDIOVERSION N/A 10/08/2014   Procedure: TRANSESOPHAGEAL ECHOCARDIOGRAM (TEE);  Surgeon: [name], MD;  Location: [place];  Service: Cardiovascular;  Laterality: N/A;  . TUBAL LIGATION      Prior to Admission medications   Medication Sig Start Date End Date Taking? Authorizing Provider  acetaminophen (TYLENOL) 500 MG tablet Take 1,000 mg by mouth at bedtime.  11/01/19  Yes [provider]  albuterol (PROVENTIL HFA;VENTOLIN HFA) 108 (90 BASE) MCG/ACT inhaler Inhale 2 puffs into the lungs 3 (three) times daily as needed for wheezing or shortness of breath.    Yes [provider]  aspirin 81 MG chewable tablet Chew 81 mg by mouth daily.   Yes [provider]  atorvastatin (LIPITOR) 40 MG tablet Take 40 mg by mouth at bedtime.    Yes [provider]  Calcium Carbonate-Vitamin D (OSCAL 500/200 D-3 PO) Take 1 tablet by mouth daily.    Yes [provider]  Cyanocobalamin (VITAMIN B 12 PO) Take 1,000 mcg by mouth daily.    Yes [provider]  enoxaparin (LOVENOX) 30 MG/0.3ML injection Inject 0.3 mLs (30 mg total) into the skin daily. For DVT prophylaxis after surgery 02/10/20 03/11/20 Yes McBane, Maylene Roes, PA-C  fluticasone (FLONASE) 50 MCG/ACT nasal spray Place 1 spray into both nostrils daily as needed for allergies.  11/02/14  Yes [provider]  furosemide (LASIX) 80 MG tablet TAKE ONE WHOLE TABLET (80MG ) IN THE MORNING AND HALF TABLET (40 MG) IN THE EVENING. Patient taking differently: Take 40-80 mg by mouth daily.  11/17/18  Yes Almyra Deforest, PA  glimepiride (AMARYL) 2 MG tablet Take 2 mg by mouth every morning. 03/30/19  Yes [provider]   HYDROcodone-acetaminophen (NORCO) 5-325 MG tablet Take 0.5-1 tablets by mouth every 6 (six) hours as needed for severe pain. 02/10/20  Yes McBane, Maylene Roes, PA-C  levothyroxine (SYNTHROID, LEVOTHROID) 100 MCG tablet Take 100 mcg by mouth daily before breakfast.  10/28/14  Yes [provider]  ondansetron (ZOFRAN) 4 MG tablet Take 4 mg by mouth every 6 (six) hours as needed for vomiting or nausea/vomiting. 02/15/20  Yes [provider]  potassium chloride SA (K-DUR,KLOR-CON) 20 MEQ tablet Take 20 mEq by mouth 2 (two) times daily.  01/06/18  Yes Almyra Deforest, PA  vancomycin (VANCOCIN) 125 MG capsule Take 125 mg by mouth 4 (four) times daily. 03/01/20  Yes [provider]  vitamin E (VITAMIN E) 180 MG (400 UNITS) capsule Take 400 Units by mouth daily.    Yes [provider]  Lancet Devices (PRODIGY LANCING DEVICE) MISC See admin instructions. 11/23/19   [provider]  Prodigy Twist Top Lancets 28G MISC CHECK BLOOD GLUCOSE (SUGAR) TWICE DAILY 11/23/19   [provider]    Scheduled Meds: . acetaminophen  1,000 mg Oral QHS  . atorvastatin  40 mg Oral QHS  . Chlorhexidine  Gluconate Cloth  6 each Topical Daily  . furosemide  40 mg Oral Daily  . insulin aspart  0-5 Units Subcutaneous QHS  . insulin aspart  0-9 Units Subcutaneous TID WC  . levothyroxine  100 mcg Oral Q0600  . multivitamin with minerals  1 tablet Oral Daily  . sodium chloride flush  3 mL Intravenous Q12H  . vancomycin  125 mg Oral QID   Continuous Infusions: . sodium chloride     PRN Meds:.sodium chloride, acetaminophen **OR** acetaminophen, albuterol, HYDROcodone-acetaminophen, ondansetron **OR** ondansetron (ZOFRAN) IV, sodium chloride flush  Allergies as of 03/08/2020 - Review Complete 03/08/2020  Allergen Reaction Noted  . Tizanidine hcl Other (See Comments) 06/02/2018  . Nsaids Itching and Other (See Comments) 12/13/2012  . Pradaxa [dabigatran etexilate mesylate] Nausea  And Vomiting 03/24/2015  . Sulfa antibiotics Nausea And Vomiting 10/29/2014  . Betadine [povidone iodine] Itching and Rash 02/17/2014    Family History  Problem Relation Age of Onset  . Pneumonia Mother   . Heart attack Father   . Diabetes Sister     Social History   Socioeconomic History  . Marital status: Married    Spouse name: Not on file  . Number of children: Not on file  . Years of education: Not on file  . Highest education level: Not on file  Occupational History  . Not on file  Tobacco Use  . Smoking status: Never Smoker  . Smokeless tobacco: Never Used  Vaping Use  . Vaping Use: Never used  Substance and Sexual Activity  . Alcohol use: No  . Drug use: No  . Sexual activity: Yes    Birth control/protection: Surgical  Other Topics Concern  . Not on file  Social History Narrative  . Not on file   Social Determinants of Health   Financial Resource Strain:   . Difficulty of Paying Living Expenses: Not on file  Food Insecurity:   . Worried About Charity fundraiser in the Last Year: Not on file  . Ran Out of Food in the Last Year: Not on file  Transportation Needs:   . Lack of Transportation (Medical): Not on file  . Lack of Transportation (Non-Medical): Not on file  Physical Activity:   . Days of Exercise per Week: Not on file  . Minutes of Exercise per Session: Not on file  Stress:   . Feeling of Stress : Not on file  Social Connections:   . Frequency of Communication with Friends and Family: Not on file  . Frequency of Social Gatherings with Friends and Family: Not on file  . Attends Religious Services: Not on file  . Active Member of Clubs or Organizations: Not on file  . Attends Archivist Meetings: Not on file  . Marital Status: Not on file  Intimate Partner Violence:   . Fear of Current or Ex-Partner: Not on file  . Emotionally Abused: Not on file  . Physically Abused: Not on file  . Sexually Abused: Not on file    Review of  Systems: Review of Systems  Constitutional: Negative for chills, fever and weight loss.  HENT: Negative for hearing loss and tinnitus.   Eyes: Negative for pain and redness.  Respiratory: Negative for cough and shortness of breath.   Cardiovascular: Negative for chest pain and palpitations.  Gastrointestinal: Positive for blood in stool (x1, resolved) and diarrhea. Negative for abdominal pain, constipation, heartburn, melena, nausea and vomiting.  Genitourinary: Negative for dysuria and flank pain.  Musculoskeletal: Negative for myalgias and neck pain.  Skin: Negative for itching and rash.  Neurological: Negative for seizures and loss of consciousness.  Endo/Heme/Allergies: Negative for polydipsia. Does not bruise/bleed easily.  Psychiatric/Behavioral: Negative for substance abuse. The patient is not nervous/anxious.      Physical Exam: Vital signs: Vitals:   03/08/20 2138 03/09/20 0525  BP: (!) 146/52 (!) 159/72  Pulse: 70 69  Resp: 19 19  Temp: 98 F (36.7 C) 97.8 F (36.6 C)  SpO2: 95% 97%   Last BM Date: 03/08/20  Physical Exam Vitals reviewed.  Constitutional:      General: She is not in acute distress. HENT:     Head: Normocephalic and atraumatic.     Nose: Nose normal. No congestion.     Mouth/Throat:     Mouth: Mucous membranes are moist.     Pharynx: Oropharynx is clear.  Eyes:     General: No scleral icterus.    Extraocular Movements: Extraocular movements intact.     Conjunctiva/sclera: Conjunctivae normal.  Cardiovascular:     Rate and Rhythm: Normal rate and regular rhythm.     Pulses: Normal pulses.  Pulmonary:     Effort: Pulmonary effort is normal. No respiratory distress.  Abdominal:     General: Bowel sounds are normal. There is no distension.     Palpations: Abdomen is soft. There is no mass.     Tenderness: There is no abdominal tenderness. There is no guarding or rebound.     Hernia: No hernia is present.  Musculoskeletal:        General: No  swelling or tenderness.     Cervical back: Normal range of motion and neck supple.  Skin:    General: Skin is warm and dry.  Neurological:     General: No focal deficit present.     Mental Status: She is alert and oriented to person, place, and time.  Psychiatric:        Mood and Affect: Mood normal.        Behavior: Behavior normal. Behavior is cooperative.      GI:  Lab Results: Recent Labs    03/08/20 1420 03/09/20 0442  WBC 13.2* 12.1*  HGB 9.4* 9.7*  HCT 30.6* 31.6*  PLT 498* 417*   BMET Recent Labs    03/08/20 1420 03/09/20 0442  NA 133* 136  K 4.4 4.4  CL 98 100  CO2 23 25  GLUCOSE 235* 95  BUN 20 20  CREATININE 1.21* 1.08*  CALCIUM 7.9* 8.1*   LFT Recent Labs    03/09/20 0442  PROT 5.9*  ALBUMIN 2.3*  AST 28  ALT 23  ALKPHOS 71  BILITOT 0.8   PT/INR Recent Labs    03/09/20 0442  LABPROT 14.5  INR 1.2     Studies/Results: No results found.  Impression: C. difficile diarrhea, persistent.  On vancomycin 125 mg 4 times per day.  Hematochezia x1, resolved.  C. difficile colitis versus diverticular bleed.   -Hemoglobin 9.7 today, stable as compared to 9.4 yesterday and 9.1 on 11/11. -INR 1.2 -Patient's last colonoscopy in 2019 was pertinent only for one hyperplastic polyp, internal hemorrhoids, diverticulosis.  Plan: Increase vancomycin to 250 mg 4 times daily.  Do not recommend any further work-up for self-limited and patient's hemoglobin has remained stable.  If patient's diarrhea persist despite increased vancomycin dosing, consider ID consult.    LOS: 0 days   Salley Slaughter  PA-C 03/09/2020, 8:38 AM  Contact #  [phone] 

## 2020-03-09 NOTE — Evaluation (Signed)
Physical Therapy Evaluation Patient Details Name: Ann Fletcher MRN: [id number] DOB: Sep 21, 1932 Today's Date: 03/09/2020   History of Present Illness  86 y.o. female with Past medical history of HTN, HLD, hypothyroidism, CKD 3B, type II DM, chronic HFpEF, A. fib and  recent C. difficile. Underwent IM Nail R femur 02/08/20-WBAT  Clinical Impression  Bed level eval on today. Session limited by loose stool/bowel incontinence. Total assist required for clean up/hygiene and line changing. Pt reported some R LE pain with activity. Will continue to follow and progress activity as tolerated. Recommend return to SNF for continued rehab.     Follow Up Recommendations SNF    Equipment Recommendations  None recommended by PT    Recommendations for Other Services       Precautions / Restrictions Precautions Precautions: Fall Restrictions Weight Bearing Restrictions: No Other Position/Activity Restrictions: WBAT      Mobility  Bed Mobility Overal bed mobility: Needs Assistance Bed Mobility: Rolling Rolling: Min assist         General bed mobility comments: Assist to roll to L and R sides. Bowel incontinence during bed mobility requiring asssit for clean up, linen changing. Postioned pt in L sidelying for pain relief (sore bottom)    Transfers                 General transfer comment: NT  Ambulation/Gait                Stairs            Wheelchair Mobility    Modified Rankin (Stroke Patients Only)       Balance                                             Pertinent Vitals/Pain Pain Assessment: Faces Faces Pain Scale: Hurts little more Pain Location: R LE with activity Pain Descriptors / Indicators: Discomfort;Grimacing;Sore Pain Intervention(s): Limited activity within patient's tolerance;Monitored during session;Repositioned    Home Living Family/patient expects to be discharged to:: Skilled nursing facility                       Prior Function Level of Independence: Needs assistance   Gait / Transfers Assistance Needed: Prior to last hospital admission for hip fx- Mod I with rollator. Currently reports minimal standing and ambulation with therapy at rehab - due to freqent watery stools.  ADL's / Homemaking Assistance Needed: Prior to last hosptial admission - daily aide for several hours. Currently requires assistance for all ADLs        Hand Dominance        Extremity/Trunk Assessment   Upper Extremity Assessment Upper Extremity Assessment: Defer to OT evaluation    Lower Extremity Assessment Lower Extremity Assessment: Generalized weakness    Cervical / Trunk Assessment Cervical / Trunk Assessment: Kyphotic  Communication   Communication: HOH  Cognition Arousal/Alertness: Awake/alert Behavior During Therapy: WFL for tasks assessed/performed Overall Cognitive Status: Within Functional Limits for tasks assessed                                        General Comments      Exercises     Assessment/Plan    PT Assessment Patient needs continued PT services  PT Problem  List Decreased strength;Decreased mobility;Decreased range of motion;Decreased activity tolerance;Decreased balance;Decreased knowledge of use of DME;Pain       PT Treatment Interventions DME instruction;Gait training;Therapeutic activities;Therapeutic exercise;Patient/family education;Balance training;Functional mobility training    PT Goals (Current goals can be found in the Care Plan section)  Acute Rehab PT Goals Patient Stated Goal: To walk and go home to husband PT Goal Formulation: With patient Time For Goal Achievement: 03/23/20 Potential to Achieve Goals: Good    Frequency Min 2X/week   Barriers to discharge        Co-evaluation               AM-PAC PT "6 Clicks" Mobility  Outcome Measure Help needed turning from your back to your side while in a flat bed without using  bedrails?: A Little Help needed moving from lying on your back to sitting on the side of a flat bed without using bedrails?: A Lot Help needed moving to and from a bed to a chair (including a wheelchair)?: A Lot Help needed standing up from a chair using your arms (e.g., wheelchair or bedside chair)?: A Lot Help needed to walk in hospital room?: Total Help needed climbing 3-5 steps with a railing? : Total 6 Click Score: 11    End of Session   Activity Tolerance: Patient tolerated treatment well Patient left: in bed;with call bell/phone within reach;with bed alarm set   PT Visit Diagnosis: Muscle weakness (generalized) (M62.81);History of falling (Z91.81);Pain;Difficulty in walking, not elsewhere classified (R26.2) Pain - Right/Left: Right Pain - part of body: Leg    Time: 6948-5462 PT Time Calculation (min) (ACUTE ONLY): 13 min   Charges:   PT Evaluation $PT Eval Moderate Complexity: 1 Mod            Doreatha Massed, PT Acute Rehabilitation  Office: [phone] Pager: [phone]

## 2020-03-10 DIAGNOSIS — K921 Melena: Secondary | ICD-10-CM | POA: Diagnosis not present

## 2020-03-10 LAB — CBC
HCT: 29.8 % — ABNORMAL LOW (ref 36.0–46.0)
Hemoglobin: 9 g/dL — ABNORMAL LOW (ref 12.0–15.0)
MCH: 26.7 pg (ref 26.0–34.0)
MCHC: 30.2 g/dL (ref 30.0–36.0)
MCV: 88.4 fL (ref 80.0–100.0)
Platelets: 353 10*3/uL (ref 150–400)
RBC: 3.37 MIL/uL — ABNORMAL LOW (ref 3.87–5.11)
RDW: 15.1 % (ref 11.5–15.5)
WBC: 7 10*3/uL (ref 4.0–10.5)
nRBC: 0 % (ref 0.0–0.2)

## 2020-03-10 LAB — COMPREHENSIVE METABOLIC PANEL
ALT: 18 U/L (ref 0–44)
AST: 24 U/L (ref 15–41)
Albumin: 2.2 g/dL — ABNORMAL LOW (ref 3.5–5.0)
Alkaline Phosphatase: 74 U/L (ref 38–126)
Anion gap: 6 (ref 5–15)
BUN: 15 mg/dL (ref 8–23)
CO2: 27 mmol/L (ref 22–32)
Calcium: 8.1 mg/dL — ABNORMAL LOW (ref 8.9–10.3)
Chloride: 100 mmol/L (ref 98–111)
Creatinine, Ser: 1.19 mg/dL — ABNORMAL HIGH (ref 0.44–1.00)
GFR, Estimated: 45 mL/min — ABNORMAL LOW (ref >60)
Glucose, Bld: 77 mg/dL (ref 70–99)
Potassium: 4.3 mmol/L (ref 3.5–5.1)
Sodium: 133 mmol/L — ABNORMAL LOW (ref 135–145)
Total Bilirubin: 0.6 mg/dL (ref 0.3–1.2)
Total Protein: 5.4 g/dL — ABNORMAL LOW (ref 6.5–8.1)

## 2020-03-10 LAB — GLUCOSE, CAPILLARY
Glucose-Capillary: 83 mg/dL (ref 70–99)
Glucose-Capillary: 129 mg/dL — ABNORMAL HIGH (ref 70–99)
Glucose-Capillary: 243 mg/dL — ABNORMAL HIGH (ref 70–99)
Glucose-Capillary: 112 mg/dL — ABNORMAL HIGH (ref 70–99)

## 2020-03-10 MED ORDER — VANCOMYCIN 50 MG/ML ORAL SOLUTION
500.0000 mg | Freq: Four times a day (QID) | ORAL | Status: DC
  Administered 2020-03-10 – 2020-03-11 (×5): 500 mg via ORAL
  Filled 2020-03-10 (×7): qty 10

## 2020-03-10 NOTE — Progress Notes (Signed)
PROGRESS NOTE  Ann Fletcher NFA:[id number] DOB: 06-10-1932 DOA: 03/08/2020 PCP: [name], MD   LOS: 0 days   Brief Narrative / Interim history: 86 year old female with history of HTN, HLD, hypothyroidism, chronic kidney disease stage IIIb, DM 2, chronic diastolic CHF, A. fib not on anticoagulation, recent hip fracture 3 weeks ago currently in [facility] for rehab comes into the hospital with persistent diarrhea as well as blood in the stool.  She apparently was diagnosed with C. difficile colitis and has been on vancomycin for the last 12 days however has been having persistent ongoing diarrhea on a daily basis and now with bright red blood.  Subjective / 24h Interval events: Has had total of 6 diarrhea episodes the last 24 hours but overall improvement. No more bleeding.  Assessment & Plan: Principal Problem C. difficile colitis with lower GI bleed -Patient has had C. difficile diarrhea for the past 4 days, not improving and now with bleeding episodes.  GI consulted and appreciate input, discussed with Dr. Therisa Doyne.  Vancomycin dose increased to 250 every 6 hours, continue to monitor.  Likely needs 14 additional days. Clinically seems to be improving -Hemoglobin stable, bleeding resolved  Active Problems Chronic kidney disease stage IIIa -Baseline creatinine 1.3-1.4, currently at baseline.  Paroxysmal A. fib, uncontrolled A. fib status post AV node ablation and insertion of pacemaker -Not on anticoagulation due to falls  Hyperlipidemia -Continue statin  Hypothyroidism -Continue Synthroid  DM2 -Continue sliding scale   CBG (last 3)  Recent Labs    03/09/20 2013 03/10/20 0720 03/10/20 1057  GLUCAP 190* 83 129*   Deconditioning, recent hip fracture -Still in need for SNF rehab, original facility did not keep bed, placement to a different facility pending  Scheduled Meds: . acetaminophen  1,000 mg Oral QHS  . atorvastatin  40 mg Oral QHS  . Chlorhexidine  Gluconate Cloth  6 each Topical Daily  . furosemide  40 mg Oral Daily  . insulin aspart  0-5 Units Subcutaneous QHS  . insulin aspart  0-9 Units Subcutaneous TID WC  . levothyroxine  100 mcg Oral Q0600  . multivitamin with minerals  1 tablet Oral Daily  . sodium chloride flush  3 mL Intravenous Q12H  . vancomycin  250 mg Oral Q6H   Continuous Infusions: . sodium chloride     PRN Meds:.sodium chloride, acetaminophen **OR** acetaminophen, albuterol, HYDROcodone-acetaminophen, ondansetron **OR** ondansetron (ZOFRAN) IV, sodium chloride flush  Diet Orders (From admission, onward)    Start     Ordered   03/08/20 1846  Diet clear liquid Room service appropriate? Yes; Fluid consistency: Thin  Diet effective now       Question Answer Comment  Room service appropriate? Yes   Fluid consistency: Thin      03/08/20 1846          DVT prophylaxis: SCDs Start: 03/08/20 1846 Place TED hose Start: 03/08/20 1846     Code Status: Full Code  Family Communication: Discussed with patient  Status is: Observation  The patient will require care spanning > 2 midnights and should be moved to inpatient because: Persistent diarrhea in the setting of persistent C. difficile colitis, monitoring for recurrent bleeding  Dispo: The patient is from: SNF              Anticipated d/c is to: SNF              Anticipated d/c date is: 2 days  Patient currently is not medically stable to d/c.  Consultants:  Gastroenterology  Procedures:  None   Microbiology  None   Antimicrobials: Vancomycin po     Objective: Vitals:   03/09/20 2015 03/10/20 0436 03/10/20 0451 03/10/20 1053  BP: (!) 136/53 (!) 130/58  (!) 118/92  Pulse: 72 69  71  Resp: 20 18  20   Temp: (!) 97.5 F (36.4 C) (!) 97.5 F (36.4 C)  98 F (36.7 C)  TempSrc: Oral Oral  Oral  SpO2: 97% 99%  98%  Weight:   62.7 kg   Height:   5\' 1"  (1.549 m)     Intake/Output Summary (Last 24 hours) at 03/10/2020 1143 Last data  filed at 03/10/2020 1048 Gross per 24 hour  Intake 2403 ml  Output 1050 ml  Net 1353 ml   Filed Weights   03/08/20 2014 03/10/20 0451  Weight: 62.1 kg 62.7 kg    Examination:  Constitutional: No distress, in bed Eyes: No scleral icterus ENMT: mmm Neck: normal, supple Respiratory: Clear bilaterally, no wheezing or crackles Cardiovascular: Regular rate and rhythm, no murmurs, no edema Abdomen: Soft, nontender, nondistended, bowel sounds positive Musculoskeletal: no clubbing / cyanosis.  Skin: No rashes appreciated Neurologic: Nonfocal  Data Reviewed: I have independently reviewed following labs and imaging studies   CBC: Recent Labs  Lab 03/08/20 1420 03/09/20 0442 03/10/20 0418  WBC 13.2* 12.1* 7.0  NEUTROABS 9.3*  --   --   HGB 9.4* 9.7* 9.0*  HCT 30.6* 31.6* 29.8*  MCV 87.7 88.8 88.4  PLT 498* 417* 161   Basic Metabolic Panel: Recent Labs  Lab 03/08/20 1420 03/09/20 0442 03/10/20 0418  NA 133* 136 133*  K 4.4 4.4 4.3  CL 98 100 100  CO2 23 25 27   GLUCOSE 235* 95 77  BUN 20 20 15   CREATININE 1.21* 1.08* 1.19*  CALCIUM 7.9* 8.1* 8.1*   Liver Function Tests: Recent Labs  Lab 03/08/20 1420 03/09/20 0442 03/10/20 0418  AST 35 28 24  ALT 26 23 18   ALKPHOS 79 71 74  BILITOT 0.5 0.8 0.6  PROT 5.9* 5.9* 5.4*  ALBUMIN 2.3* 2.3* 2.2*   Coagulation Profile: Recent Labs  Lab 03/09/20 0442  INR 1.2   HbA1C: Recent Labs    03/09/20 0442  HGBA1C 6.8*   CBG: Recent Labs  Lab 03/09/20 1157 03/09/20 1656 03/09/20 2013 03/10/20 0720 03/10/20 1057  GLUCAP 168* 151* 190* 83 129*    Recent Results (from the past 240 hour(s))  Resp Panel by RT-PCR (Flu A&B, Covid) Nasopharyngeal Swab     Status: None   Collection Time: 03/08/20  6:02 PM   Specimen: Nasopharyngeal Swab; Nasopharyngeal(NP) swabs in vial transport medium  Result Value Ref Range Status   SARS Coronavirus 2 by RT PCR NEGATIVE NEGATIVE Final    Comment: (NOTE) SARS-CoV-2 target  nucleic acids are NOT DETECTED.  The SARS-CoV-2 RNA is generally detectable in upper respiratory specimens during the acute phase of infection. The lowest concentration of SARS-CoV-2 viral copies this assay can detect is 138 copies/mL. A negative result does not preclude SARS-Cov-2 infection and should not be used as the sole basis for treatment or other patient management decisions. A negative result may occur with  improper specimen collection/handling, submission of specimen other than nasopharyngeal swab, presence of viral mutation(s) within the areas targeted by this assay, and inadequate number of viral copies(<138 copies/mL). A negative result must be combined with clinical observations, patient history, and epidemiological information.  The expected result is Negative.  Fact Sheet for Patients:  EntrepreneurPulse.com.au  Fact Sheet for Healthcare Providers:  IncredibleEmployment.be  This test is no t yet approved or cleared by the [place] FDA and  has been authorized for detection and/or diagnosis of SARS-CoV-2 by FDA under an Emergency Use Authorization (EUA). This EUA will remain  in effect (meaning this test can be used) for the duration of the COVID-19 declaration under Section 564(b)(1) of the Act, 21 U.S.C.section 360bbb-3(b)(1), unless the authorization is terminated  or revoked sooner.       Influenza A by PCR NEGATIVE NEGATIVE Final   Influenza B by PCR NEGATIVE NEGATIVE Final    Comment: (NOTE) The Xpert Xpress SARS-CoV-2/FLU/RSV plus assay is intended as an aid in the diagnosis of influenza from Nasopharyngeal swab specimens and should not be used as a sole basis for treatment. Nasal washings and aspirates are unacceptable for Xpert Xpress SARS-CoV-2/FLU/RSV testing.  Fact Sheet for Patients: EntrepreneurPulse.com.au  Fact Sheet for Healthcare  Providers: IncredibleEmployment.be  This test is not yet approved or cleared by the [place] FDA and has been authorized for detection and/or diagnosis of SARS-CoV-2 by FDA under an Emergency Use Authorization (EUA). This EUA will remain in effect (meaning this test can be used) for the duration of the COVID-19 declaration under Section 564(b)(1) of the Act, 21 U.S.C. section 360bbb-3(b)(1), unless the authorization is terminated or revoked.  Performed at [facility], [place] [address], [place], [place] [address]      Radiology Studies: No results found.   Marzetta Board, MD, PhD [facility]  Between 7 am - 7 pm I am available, please contact me via Amion or Securechat  Between 7 pm - 7 am I am not available, please contact night coverage MD/APP via Amion

## 2020-03-10 NOTE — Plan of Care (Signed)
  Problem: Education: Goal: Knowledge of General Education information will improve Description: Including pain rating scale, medication(s)/side effects and non-pharmacologic comfort measures Outcome: Progressing   Problem: Clinical Measurements: Goal: Respiratory complications will improve Outcome: Progressing   Problem: Activity: Goal: Risk for activity intolerance will decrease Outcome: Progressing   Problem: Coping: Goal: Level of anxiety will decrease Outcome: Progressing   Problem: Elimination: Goal: Will not experience complications related to bowel motility Outcome: Progressing Goal: Will not experience complications related to urinary retention Outcome: Progressing   Problem: Pain Managment: Goal: General experience of comfort will improve Outcome: Progressing   Problem: Safety: Goal: Ability to remain free from injury will improve Outcome: Progressing   Problem: Skin Integrity: Goal: Risk for impaired skin integrity will decrease Outcome: Progressing

## 2020-03-10 NOTE — Progress Notes (Signed)
[facility] Gastroenterology Progress Note  Ann Fletcher 86 y.o. 09-17-1932  CC:  C diff diarrhea  Subjective: Patient reports continued diarrhea, though states she thinks the amount of stool may be decreasing.  Intermittent scant amounts of red blood mixed with stool. Denies any abdominal pain, nausea, or vomiting.  Per flowsheet, has had 7 BMs in the last 24 hours.  However, more recent BMs are documented as "small" amount, whereas prior to Vancomycin dose adjustment, most BMs were at least "medium" amount.  ROS : Review of Systems  Cardiovascular: Negative for chest pain and palpitations.  Gastrointestinal: Positive for blood in stool and diarrhea. Negative for abdominal pain, constipation, heartburn, melena, nausea and vomiting.    Objective: Vital signs in last 24 hours: Vitals:   03/10/20 0436 03/10/20 1053  BP: (!) 130/58 (!) 118/92  Pulse: 69 71  Resp: 18 20  Temp: (!) 97.5 F (36.4 C) 98 F (36.7 C)  SpO2: 99% 98%    Physical Exam:  General:  Lethargic, elderly, oriented, cooperative, no distress  Head:  Normocephalic, without obvious abnormality, atraumatic  Eyes:  Anicteric sclera, EOMs intact   Lungs:   Clear to auscultation bilaterally, respirations unlabored  Heart:  Regular rate and rhythm, S1, S2 normal  Abdomen:   Soft, non-tender, non-distended, bowel sounds active all four quadrants,  no guarding or peritoneal signs  Extremities: Extremities normal, atraumatic, no  edema  Pulses: 2+ and symmetric    Lab Results: Recent Labs    03/09/20 0442 03/10/20 0418  NA 136 133*  K 4.4 4.3  CL 100 100  CO2 25 27  GLUCOSE 95 77  BUN 20 15  CREATININE 1.08* 1.19*  CALCIUM 8.1* 8.1*   Recent Labs    03/09/20 0442 03/10/20 0418  AST 28 24  ALT 23 18  ALKPHOS 71 74  BILITOT 0.8 0.6  PROT 5.9* 5.4*  ALBUMIN 2.3* 2.2*   Recent Labs    03/08/20 1420 03/09/20 0442 03/10/20 0418  WBC 13.2* 12.1* 7.0  NEUTROABS 9.3*  --   --   HGB 9.4* 9.7* 9.0*  HCT  30.6* 31.6* 29.8*  MCV 87.7 88.8 88.4  PLT 498* 417* 353   Recent Labs    03/09/20 0442  LABPROT 14.5  INR 1.2    Assessment: C diff diarrhea:  Patient was on 125 mg Vancomycin 4 times daily for 12 days.  Dose increased to 250 mg 4 times daily yesterday.  Scant amount of red blood mixed in stool, most likely related to C diff infection.  Hgb 9.0, stable.  Plan: Increase Vancomycin to 500 mg four times daily.  If this dose improves symptoms, continue for a total of 14 days.  [facility] will follow.  Salley Slaughter PA-C 03/10/2020, 11:15 AM  Contact #  [phone]

## 2020-03-10 NOTE — TOC Progression Note (Addendum)
Transition of Care [facility]) - Progression Note    Patient Details  Name: TONISHA SILVEY MRN: [id number] Date of Birth: [birth year]  Transition of Care [facility]) CM/SW Contact  Ross Ludwig, [place] Phone Number: 03/10/2020, 1:48 PM  Clinical Narrative:     CSW spoke to [facility] which is the SNF that family chose, and they are not in network with insurance also they do not have any beds.  CSW updated patient's son and daughter in law, and they are requesting that CSW contact other facilities.  CSW called [facility], [place], left message for each of the facilities waiting to hear back.  CSW contacted [facility] Medicare to start insurance authorization, reference number [id number].   Expected Discharge Plan: [place] Barriers to Discharge: No Barriers Identified  Expected Discharge Plan and Services Expected Discharge Plan: [place] Choice: Nursing Home Living arrangements for the past 2 months: [place]                                       Social Determinants of Health (SDOH) Interventions    Readmission Risk Interventions Readmission Risk Prevention Plan 02/09/2020  Transportation Screening Complete  PCP or Specialist Appt within 5-7 Days Complete  Medication Review (RN CM) Complete  Some recent data might be hidden

## 2020-03-10 NOTE — NC FL2 (Signed)
[place] LEVEL OF CARE SCREENING TOOL     IDENTIFICATION  Patient Name: Ann Fletcher Birthdate: 1932-07-18 Sex: female Admission Date (Current Location): 03/08/2020  [place] and [place] Number:  Ann Fletcher [id number] [name],  [name] [name]      Provider Number: [id number]  Attending Physician Name and Address:  Caren Griffins, MD  Relative Name and Phone Number:  Kendel, [place] [phone]    Current Level of Care: Hospital Recommended Level of Care: [place] Prior Approval Number:    Date Approved/Denied:   PASRR Number: [id number]  Discharge Plan: SNF    Current Diagnoses: Patient Active Problem List   Diagnosis Date Noted  . BRBPR (bright red blood per rectum) 03/08/2020  . Hip fracture ([place]) 02/08/2020  . Fracture, proximal femur, right, closed, initial encounter ([place]) 02/07/2020  . Complete heart block ([place]) 12/17/2019  . CRI (chronic renal insufficiency), stage 4 (severe) ([place]) 04/16/2019  . Persistent atrial fibrillation ([place]) 12/02/2018  . Pacemaker 12/02/2018  . 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 Anemia 11/19/2017  . Anorexia 11/19/2017  . Interstitial lung disease ([place]) 11/07/2017  . Iron deficiency anemia   . Acute on chronic diastolic CHF (congestive heart failure) ([place])   . Status post atrioventricular nodal ablation 08/24/2017  . Chronic diastolic CHF (congestive heart failure) ([place]) 08/24/2017  . Type 2 diabetes mellitus with renal manifestations, controlled ([place]) 03/24/2015  . Essential hypertension 02/17/2014  . Hyperlipidemia 02/17/2014  . Lower extremity edema 02/17/2014  . Right carotid bruit 02/17/2014    Orientation RESPIRATION BLADDER Height & Weight     Self,Time,Situation,Place  Normal Incontinent Weight: 138 lb 3.7 oz (62.7 kg) Height:  5\' 1"  (154.9 cm)  BEHAVIORAL SYMPTOMS/MOOD NEUROLOGICAL BOWEL NUTRITION STATUS       Incontinent Diet (Clear Liquid diet)  AMBULATORY STATUS COMMUNICATION OF NEEDS Skin   Limited Assist Verbally PU Stage and Appropriate Care,Surgical wounds (PRN dressing changes)                       Personal Care Assistance Level of Assistance  Bathing,Feeding,Dressing Bathing Assistance: Limited assistance Feeding assistance: Limited assistance Dressing Assistance: Limited assistance     Functional Limitations Info  Sight,Hearing,Speech Sight Info: Adequate Hearing Info: Adequate Speech Info: Adequate    SPECIAL CARE FACTORS FREQUENCY  PT (By licensed PT),OT (By licensed OT)     PT Frequency: Minimum 5x a week OT Frequency: Minimum 5x a week            Contractures Contractures Info: Not present    Additional Factors Info  Code Status,Isolation Precautions,Allergies,Insulin Sliding Scale Code Status Info: Full Code Allergies Info: Tizanidine Hcl   Nsaids   Pradaxa Sulfa Antibiotics   Betadine   Insulin Sliding Scale Info: insulin aspart (novoLOG) injection 0-9 Units 3x a day with meals Isolation Precautions Info: Enteric Precautions     Current Medications (03/10/2020):  This is the current hospital active medication list Current Facility-Administered Medications  Medication Dose Route Frequency Provider Last Rate Last Admin  . 0.9 %  sodium chloride infusion  250 mL Intravenous PRN Lavina Hamman, MD      . acetaminophen (TYLENOL) tablet 650 mg  650 mg Oral Q6H PRN Lavina Hamman, MD       Or  . acetaminophen (TYLENOL) suppository 650 mg  650 mg Rectal Q6H PRN Lavina Hamman, MD      . acetaminophen (  TYLENOL) tablet 1,000 mg  1,000 mg Oral QHS Lavina Hamman, MD   1,000 mg at 03/09/20 2241  . albuterol (PROVENTIL) (2.5 MG/3ML) 0.083% nebulizer solution 2.5 mg  2.5 mg Nebulization Q2H PRN Lavina Hamman, MD      . atorvastatin (LIPITOR) tablet 40 mg  40 mg Oral QHS Lavina Hamman, MD   40 mg at 03/09/20 2241  . Chlorhexidine Gluconate Cloth 2 % PADS 6  each  6 each Topical Daily Lavina Hamman, MD   6 each at 03/10/20 1127  . furosemide (LASIX) tablet 40 mg  40 mg Oral Daily Lavina Hamman, MD   40 mg at 03/10/20 1128  . HYDROcodone-acetaminophen (NORCO/VICODIN) 5-325 MG per tablet 0.5-1 tablet  0.5-1 tablet Oral Q6H PRN Lavina Hamman, MD      . insulin aspart (novoLOG) injection 0-5 Units  0-5 Units Subcutaneous QHS Lavina Hamman, MD      . insulin aspart (novoLOG) injection 0-9 Units  0-9 Units Subcutaneous TID WC Lavina Hamman, MD   2 Units at 03/10/20 1337  . levothyroxine (SYNTHROID) tablet 100 mcg  100 mcg Oral Q0600 Lavina Hamman, MD   100 mcg at 03/10/20 7680  . multivitamin with minerals tablet 1 tablet  1 tablet Oral Daily Lavina Hamman, MD   1 tablet at 03/10/20 1128  . ondansetron (ZOFRAN) tablet 4 mg  4 mg Oral Q6H PRN Lavina Hamman, MD       Or  . ondansetron [facility]) injection 4 mg  4 mg Intravenous Q6H PRN Lavina Hamman, MD      . sodium chloride flush (NS) 0.9 % injection 3 mL  3 mL Intravenous Q12H Lavina Hamman, MD   3 mL at 03/10/20 1128  . sodium chloride flush (NS) 0.9 % injection 3 mL  3 mL Intravenous PRN Lavina Hamman, MD      . vancomycin (VANCOCIN) 50 mg/mL oral solution 500 mg  500 mg Oral Q6H Salley Slaughter, PA-C         Discharge Medications: Please see discharge summary for a list of discharge medications.  Relevant Imaging Results:  Relevant Lab Results:   Additional Information SSN [id number]  Ross Ludwig, LCSW

## 2020-03-11 DIAGNOSIS — R339 Retention of urine, unspecified: Secondary | ICD-10-CM | POA: Diagnosis not present

## 2020-03-11 DIAGNOSIS — A0472 Enterocolitis due to Clostridium difficile, not specified as recurrent: Secondary | ICD-10-CM | POA: Diagnosis present

## 2020-03-11 DIAGNOSIS — M1990 Unspecified osteoarthritis, unspecified site: Secondary | ICD-10-CM | POA: Diagnosis not present

## 2020-03-11 DIAGNOSIS — J849 Interstitial pulmonary disease, unspecified: Secondary | ICD-10-CM | POA: Diagnosis not present

## 2020-03-11 DIAGNOSIS — S72009D Fracture of unspecified part of neck of unspecified femur, subsequent encounter for closed fracture with routine healing: Secondary | ICD-10-CM | POA: Diagnosis not present

## 2020-03-11 DIAGNOSIS — K625 Hemorrhage of anus and rectum: Secondary | ICD-10-CM | POA: Diagnosis not present

## 2020-03-11 DIAGNOSIS — Z79899 Other long term (current) drug therapy: Secondary | ICD-10-CM | POA: Diagnosis not present

## 2020-03-11 DIAGNOSIS — Z950 Presence of cardiac pacemaker: Secondary | ICD-10-CM | POA: Diagnosis not present

## 2020-03-11 DIAGNOSIS — L89620 Pressure ulcer of left heel, unstageable: Secondary | ICD-10-CM | POA: Diagnosis not present

## 2020-03-11 DIAGNOSIS — M6250 Muscle wasting and atrophy, not elsewhere classified, unspecified site: Secondary | ICD-10-CM | POA: Diagnosis not present

## 2020-03-11 DIAGNOSIS — M6281 Muscle weakness (generalized): Secondary | ICD-10-CM | POA: Diagnosis not present

## 2020-03-11 DIAGNOSIS — Z833 Family history of diabetes mellitus: Secondary | ICD-10-CM | POA: Diagnosis not present

## 2020-03-11 DIAGNOSIS — R278 Other lack of coordination: Secondary | ICD-10-CM | POA: Diagnosis not present

## 2020-03-11 DIAGNOSIS — D631 Anemia in chronic kidney disease: Secondary | ICD-10-CM | POA: Diagnosis not present

## 2020-03-11 DIAGNOSIS — K921 Melena: Secondary | ICD-10-CM | POA: Diagnosis not present

## 2020-03-11 DIAGNOSIS — E1165 Type 2 diabetes mellitus with hyperglycemia: Secondary | ICD-10-CM | POA: Diagnosis present

## 2020-03-11 DIAGNOSIS — I4891 Unspecified atrial fibrillation: Secondary | ICD-10-CM | POA: Diagnosis not present

## 2020-03-11 DIAGNOSIS — N1832 Chronic kidney disease, stage 3b: Secondary | ICD-10-CM | POA: Diagnosis present

## 2020-03-11 DIAGNOSIS — D649 Anemia, unspecified: Secondary | ICD-10-CM | POA: Diagnosis not present

## 2020-03-11 DIAGNOSIS — S72001D Fracture of unspecified part of neck of right femur, subsequent encounter for closed fracture with routine healing: Secondary | ICD-10-CM | POA: Diagnosis not present

## 2020-03-11 DIAGNOSIS — F419 Anxiety disorder, unspecified: Secondary | ICD-10-CM | POA: Diagnosis present

## 2020-03-11 DIAGNOSIS — E039 Hypothyroidism, unspecified: Secondary | ICD-10-CM | POA: Diagnosis not present

## 2020-03-11 DIAGNOSIS — I10 Essential (primary) hypertension: Secondary | ICD-10-CM | POA: Diagnosis not present

## 2020-03-11 DIAGNOSIS — E1100 Type 2 diabetes mellitus with hyperosmolarity without nonketotic hyperglycemic-hyperosmolar coma (NKHHC): Secondary | ICD-10-CM | POA: Diagnosis not present

## 2020-03-11 DIAGNOSIS — I130 Hypertensive heart and chronic kidney disease with heart failure and stage 1 through stage 4 chronic kidney disease, or unspecified chronic kidney disease: Secondary | ICD-10-CM | POA: Diagnosis present

## 2020-03-11 DIAGNOSIS — Z888 Allergy status to other drugs, medicaments and biological substances status: Secondary | ICD-10-CM | POA: Diagnosis not present

## 2020-03-11 DIAGNOSIS — I4819 Other persistent atrial fibrillation: Secondary | ICD-10-CM | POA: Diagnosis not present

## 2020-03-11 DIAGNOSIS — Z8249 Family history of ischemic heart disease and other diseases of the circulatory system: Secondary | ICD-10-CM | POA: Diagnosis not present

## 2020-03-11 DIAGNOSIS — R2681 Unsteadiness on feet: Secondary | ICD-10-CM | POA: Diagnosis not present

## 2020-03-11 DIAGNOSIS — Z882 Allergy status to sulfonamides status: Secondary | ICD-10-CM | POA: Diagnosis not present

## 2020-03-11 DIAGNOSIS — L89610 Pressure ulcer of right heel, unstageable: Secondary | ICD-10-CM | POA: Diagnosis not present

## 2020-03-11 DIAGNOSIS — R262 Difficulty in walking, not elsewhere classified: Secondary | ICD-10-CM | POA: Diagnosis not present

## 2020-03-11 DIAGNOSIS — F439 Reaction to severe stress, unspecified: Secondary | ICD-10-CM | POA: Diagnosis present

## 2020-03-11 DIAGNOSIS — I482 Chronic atrial fibrillation, unspecified: Secondary | ICD-10-CM | POA: Diagnosis not present

## 2020-03-11 DIAGNOSIS — E559 Vitamin D deficiency, unspecified: Secondary | ICD-10-CM | POA: Diagnosis not present

## 2020-03-11 DIAGNOSIS — N183 Chronic kidney disease, stage 3 unspecified: Secondary | ICD-10-CM | POA: Diagnosis not present

## 2020-03-11 DIAGNOSIS — R2689 Other abnormalities of gait and mobility: Secondary | ICD-10-CM | POA: Diagnosis not present

## 2020-03-11 DIAGNOSIS — E785 Hyperlipidemia, unspecified: Secondary | ICD-10-CM | POA: Diagnosis not present

## 2020-03-11 DIAGNOSIS — E1122 Type 2 diabetes mellitus with diabetic chronic kidney disease: Secondary | ICD-10-CM | POA: Diagnosis not present

## 2020-03-11 DIAGNOSIS — Z7984 Long term (current) use of oral hypoglycemic drugs: Secondary | ICD-10-CM | POA: Diagnosis not present

## 2020-03-11 DIAGNOSIS — Z7982 Long term (current) use of aspirin: Secondary | ICD-10-CM | POA: Diagnosis not present

## 2020-03-11 DIAGNOSIS — Z79890 Hormone replacement therapy (postmenopausal): Secondary | ICD-10-CM | POA: Diagnosis not present

## 2020-03-11 DIAGNOSIS — K922 Gastrointestinal hemorrhage, unspecified: Secondary | ICD-10-CM | POA: Diagnosis not present

## 2020-03-11 DIAGNOSIS — Z20822 Contact with and (suspected) exposure to covid-19: Secondary | ICD-10-CM | POA: Diagnosis present

## 2020-03-11 DIAGNOSIS — M810 Age-related osteoporosis without current pathological fracture: Secondary | ICD-10-CM | POA: Diagnosis present

## 2020-03-11 DIAGNOSIS — I5032 Chronic diastolic (congestive) heart failure: Secondary | ICD-10-CM | POA: Diagnosis present

## 2020-03-11 DIAGNOSIS — I5030 Unspecified diastolic (congestive) heart failure: Secondary | ICD-10-CM | POA: Diagnosis not present

## 2020-03-11 LAB — RESP PANEL BY RT-PCR (FLU A&B, COVID) ARPGX2
Influenza A by PCR: NEGATIVE
Influenza B by PCR: NEGATIVE
SARS Coronavirus 2 by RT PCR: NEGATIVE

## 2020-03-11 LAB — GLUCOSE, CAPILLARY
Glucose-Capillary: 92 mg/dL (ref 70–99)
Glucose-Capillary: 150 mg/dL — ABNORMAL HIGH (ref 70–99)
Glucose-Capillary: 154 mg/dL — ABNORMAL HIGH (ref 70–99)
Glucose-Capillary: 133 mg/dL — ABNORMAL HIGH (ref 70–99)

## 2020-03-11 MED ORDER — HYDROCODONE-ACETAMINOPHEN 5-325 MG PO TABS
0.5000 | ORAL_TABLET | Freq: Four times a day (QID) | ORAL | 0 refills | Status: DC | PRN
Start: 2020-03-11 — End: 2020-08-22

## 2020-03-11 MED ORDER — VANCOMYCIN 50 MG/ML ORAL SOLUTION: 500.0000 mg | Freq: Four times a day (QID) | ORAL | 0 refills | Status: AC

## 2020-03-11 NOTE — Progress Notes (Addendum)
Occupational Therapy Treatment Patient Details Name: Ann Fletcher MRN: [id number] DOB: 12-Jan-1933 Today's Date: 03/11/2020    History of present illness 86 y.o. female with Past medical history of HTN, HLD, hypothyroidism, CKD 3B, type II DM, chronic HFpEF, A. fib and  recent C. difficile. Underwent IM Nail R femur 02/08/20-[facility]   OT comments  Treatment focused on bed mobility during bowel incontinence episode and therapeutic exercise while in bed. Patient min assist for rolling in bed but total assist for toileting/pericare. Patient tolerated therapeutic exercises while in bed. Exhibits heavy breathing with exercise but sat 99%. Continues to be limited by frequent BMs.   Follow Up Recommendations  SNF    Equipment Recommendations  None recommended by OT    Recommendations for Other Services      Precautions / Restrictions Precautions Precautions: Fall Precaution Comments: frequent loose stools Restrictions Weight Bearing Restrictions: No Other Position/Activity Restrictions: WBAT       Mobility Bed Mobility   Bed Mobility: Rolling Rolling: Min assist         General bed mobility comments: Assist to roll to L and R sides. Bowel incontinence during bed mobility requiring asssit for clean up, linen changing. Patient able to use rails to assist with rolling.  Transfers                      Balance                                           ADL either performed or assessed with clinical judgement   ADL Overall ADL's : Needs assistance/impaired                     Lower Body Dressing: Total assistance;Bed level       Toileting- Clothing Manipulation and Hygiene: Bed level;Total assistance Toileting - Clothing Manipulation Details (indicate cue type and reason): Total assist at bed level for pericare after incontinent BM. Patient able to assist with rolilng but unable to assist with clothing or hygiene.             Vision  Patient Visual Report: No change from baseline     Perception     Praxis      Cognition Arousal/Alertness: Awake/alert Behavior During Therapy: WFL for tasks assessed/performed Overall Cognitive Status: Within Functional Limits for tasks assessed                                          Exercises Other Exercises Other Exercises: Straight Leg Raises on Left - 10 reps x 2 sets, Straight Leg Raises on Right (active assist by patient with modified gait belt) 10 reps x 2 sets Other Exercises: Chest Press with Pillow w/ BUE 10 reps x 2 sets (HOB at 25 deg), Over head with pillow w/ BUE 10 reps x 2 sets   Shoulder Instructions       General Comments      Pertinent Vitals/ Pain       Pain Assessment: Faces Faces Pain Scale: Hurts a little bit Pain Location: bottom/periare sore Pain Descriptors / Indicators: Discomfort;Grimacing;Sore Pain Intervention(s): Monitored during session  Home Living  Prior Functioning/Environment              Frequency  Min 2X/week        Progress Toward Goals  OT Goals(current goals can now be found in the care plan section)  Progress towards OT goals: OT to reassess next treatment  Acute Rehab OT Goals Patient Stated Goal: To walk and go home to husband OT Goal Formulation: With patient Time For Goal Achievement: 03/23/20 Potential to Achieve Goals: Good  Plan Discharge plan remains appropriate    Co-evaluation                 AM-PAC OT "6 Clicks" Daily Activity     Outcome Measure   Help from another person eating meals?: A Little Help from another person taking care of personal grooming?: A Little Help from another person toileting, which includes using toliet, bedpan, or urinal?: Total Help from another person bathing (including washing, rinsing, drying)?: A Lot Help from another person to put on and taking off regular upper body clothing?: A  Little Help from another person to put on and taking off regular lower body clothing?: Total 6 Click Score: 13    End of Session    OT Visit Diagnosis: Other abnormalities of gait and mobility (R26.89);Muscle weakness (generalized) (M62.81);Unsteadiness on feet (R26.81)   Activity Tolerance Patient tolerated treatment well   Patient Left with call bell/phone within reach;with bed alarm set   Nurse Communication Mobility status        Time: 1420-1447 OT Time Calculation (min): 27 min  Charges: OT General Charges $OT Visit: 1 Visit OT Treatments $Self Care/Home Management : 8-22 mins $Therapeutic Exercise: 8-22 mins  Kasim Mccorkle, OTR/L [place]  Office [phone] Pager: [place] 03/11/2020, 4:32 PM

## 2020-03-11 NOTE — Progress Notes (Addendum)
[facility] Gastroenterology Progress Note  Ann Fletcher 86 y.o. 20-Mar-1933  CC:  C diff diarrhea  Subjective: Patient denies abdominal pain.  She thinks her BMs are decreasing in amount and frequency.  Denies rectal bleeding.  Denies nausea, or vomiting.  Is tolerating a diet, had grits and sausage for breakfast.  Has had 8 BMs in the last 24 hours, but only 2 BMs in the last 12 hours after increasing Vancomycin to 500 mg QID.  ROS : Review of Systems  Cardiovascular: Negative for chest pain and palpitations.  Gastrointestinal: Positive for diarrhea. Negative for abdominal pain, blood in stool, constipation, heartburn, melena, nausea and vomiting.    Objective: Vital signs in last 24 hours: Vitals:   03/10/20 2000 03/11/20 0328  BP: (!) 125/51 114/78  Pulse: 70 71  Resp:  20  Temp: 98.1 F (36.7 C) (!) 97.3 F (36.3 C)  SpO2: 98% 97%    Physical Exam:  General:  Alert, oriented, cooperative, no distress  Head:  Normocephalic, without obvious abnormality, atraumatic  Eyes:  Anicteric sclera, EOMs intact   Lungs:   Respirations unlabored, breathing comfortably on room air  Heart:  Regular rate and rhythm  Abdomen:   Soft, non-tender, non-distended,  no guarding or peritoneal signs  Extremities: Extremities normal, atraumatic, no  edema  Pulses: 2+ and symmetric    Lab Results: Recent Labs    03/09/20 0442 03/10/20 0418  NA 136 133*  K 4.4 4.3  CL 100 100  CO2 25 27  GLUCOSE 95 77  BUN 20 15  CREATININE 1.08* 1.19*  CALCIUM 8.1* 8.1*   Recent Labs    03/09/20 0442 03/10/20 0418  AST 28 24  ALT 23 18  ALKPHOS 71 74  BILITOT 0.8 0.6  PROT 5.9* 5.4*  ALBUMIN 2.3* 2.2*   Recent Labs    03/08/20 1420 03/09/20 0442 03/10/20 0418  WBC 13.2* 12.1* 7.0  NEUTROABS 9.3*  --   --   HGB 9.4* 9.7* 9.0*  HCT 30.6* 31.6* 29.8*  MCV 87.7 88.8 88.4  PLT 498* 417* 353   Recent Labs    03/09/20 0442  LABPROT 14.5  INR 1.2    Assessment: C diff diarrhea: Dose  increased to 250 mg 4 times daily yesterday.  Frequency decreased over the last 12 hours, only 2 BMs in the last 12 hours.  Scant amount of red blood mixed in stool, most likely related to C diff infection.  Hgb 9.0 yesterday, stable.  Plan: Continue Vancomycin to 500 mg four times daily for a total of 14 days.  Discussed with Dr. Cruzita Lederer, [place] to discharge from a GI standpoint.  Follow up with primary gastroenterologist Dr. Allyn Kenner as an outpatient.  [facility] will sign off.  Please contact us if we can be of any further assistance during this hospital stay.  Salley Slaughter PA-C 03/11/2020, 11:22 AM  Contact #  [phone]

## 2020-03-11 NOTE — TOC Transition Note (Signed)
Transition of Care [facility]) - CM/SW Discharge Note   Patient Details  Name: Ann Fletcher MRN: [id number] Date of Birth: 06-29-1932  [facility] CM/SW Contact:  Ross Ludwig, LCSW Phone Number: 03/11/2020, 3:38 PM   Clinical Narrative:     Patient to be d/c'ed today to [facility] room 3214.  Patient and family agreeable to plans will transport via ems RN to call report to [phone].  Patient's son is aware that she will be discharging today.  Final next level of care: Skilled Nursing Facility Barriers to Discharge: Barriers Resolved   Patient Goals and CMS Choice Patient states their goals for this hospitalization and ongoing recovery are:: To go to SNF for short term rehab, then return back home with home health. CMS Medicare.gov Compare Post Acute Care list provided to:: Patient Represenative (must comment) Choice offered to / list presented to : Adult Children  Discharge Placement   Existing PASRR number confirmed : 03/09/20          Patient chooses bed at: [facility] Patient to be transferred to facility by: [facility] Name of family member notified: Rifky, Lapre [phone] Patient and family notified of of transfer: 03/11/20  Discharge Plan and Services     Post Acute Care Choice: Nursing Home                               Social Determinants of Health (SDOH) Interventions     Readmission Risk Interventions Readmission Risk Prevention Plan 02/09/2020  Transportation Screening Complete  PCP or Specialist Appt within 5-7 Days Complete  Medication Review (RN CM) Complete  Some recent data might be hidden

## 2020-03-11 NOTE — Progress Notes (Signed)
Report called to [facility], pt to go room 3214.  Awaits EMS arrival Report called several times and attempted to give report to DAWN, CN. Spoke with secretary/receptionist each time and reported she would give the message to [facility], Probation officer. SRP, RN

## 2020-03-11 NOTE — Discharge Summary (Signed)
Physician Discharge Summary  Ann Fletcher [id number] DOB: April 28, 1932 DOA: 03/08/2020  PCP: [name], MD  Admit date: 03/08/2020 Discharge date: 03/11/2020  Admitted From: SNF Disposition:  SNF  Recommendations for Outpatient Follow-up:  1. Follow up with PCP in 1-2 weeks  Home Health: none Equipment/Devices: none  Discharge Condition: stable CODE STATUS: Full code Diet recommendation: regular  HPI: Per admitting MD, Ann Fletcher is a 86 y.o. female with Past medical history of HTN, HLD, hypothyroidism, CKD 3B, type II DM, chronic HFpEF, A. fib not on anticoagulation, recent hip fracture with surgery currently on DVT prophylaxis with Lovenox, recent C. Difficile. Patient was brought in as she was found to have blood in the stool with clots for last 2 days. Patient recently was diagnosed with C. difficile colitis and has been on vancomycin for last 12 days. Patient had 1 due to diarrhea on a daily basis. Reportedly yesterday there was some notice of clots in her stool. This morning also this repeated and therefore she was brought to the hospital for further work-up. Recent hospitalized for hip fracture underwent replacement.  Postprocedure patient was on DVT prophylaxis with Lovenox which she is supposed to finish on 03/11/2020. Currently the time of my evaluation patient denies any complaints no nausea vomiting.  No chest pain.  No abdominal pain.  No headache no dizziness no lightheadedness.  She thinks that she is pale in color.  Hospital Course / Discharge diagnoses: Principal Problem C. difficile colitis with lower GI bleed -Patient has had C. difficile diarrhea for the past 4 days, not improving and now with bleeding episodes.  Gastroenterology consulted and followed patient while hospitalized.  Her vancomycin dose was initially increased to 250 mg every 6 hours however given persistent diarrhea he was furthermore increased to 500 mg every 6.  Her diarrhea is  improving, she is able to tolerate a regular diet, will recommend 14 additional days of treatment.  Bleeding has resolved, hemoglobin stable  Active Problems Chronic kidney disease stage IIIa -Baseline creatinine 1.3-1.4, currently at baseline. Paroxysmal A. fib, uncontrolled A. fib status post AV node ablation and insertion of pacemaker -Not on anticoagulation due to falls Hyperlipidemia -Continue statin Hypothyroidism -Continue Synthroid DM2 -Continue sliding scale  Sepsis ruled out  Discharge Instructions  Allergies as of 03/11/2020      Reactions   Tizanidine Hcl Other (See Comments)   Dizziness, patient reports fall   Nsaids Itching, Other (See Comments)   Stomach cramps   Pradaxa [dabigatran Etexilate Mesylate] Nausea And Vomiting   Sulfa Antibiotics Nausea And Vomiting   Betadine [povidone Iodine] Itching, Rash      Medication List    STOP taking these medications   enoxaparin 30 MG/0.3ML injection Commonly known as: Lovenox   vancomycin 125 MG capsule Commonly known as: VANCOCIN     TAKE these medications   albuterol 108 (90 Base) MCG/ACT inhaler Commonly known as: VENTOLIN HFA Inhale 2 puffs into the lungs 3 (three) times daily as needed for wheezing or shortness of breath.   aspirin 81 MG chewable tablet Chew 81 mg by mouth daily.   atorvastatin 40 MG tablet Commonly known as: LIPITOR Take 40 mg by mouth at bedtime.   fluticasone 50 MCG/ACT nasal spray Commonly known as: FLONASE Place 1 spray into both nostrils daily as needed for allergies.   furosemide 80 MG tablet Commonly known as: LASIX TAKE ONE WHOLE TABLET (80MG ) IN THE MORNING AND HALF TABLET (40 MG) IN THE EVENING. What  changed:   how much to take  how to take this  when to take this  additional instructions   glimepiride 2 MG tablet Commonly known as: AMARYL Take 2 mg by mouth every morning.   HYDROcodone-acetaminophen 5-325 MG tablet Commonly known as: Norco Take 0.5-1 tablets  by mouth every 6 (six) hours as needed for severe pain.   levothyroxine 100 MCG tablet Commonly known as: SYNTHROID Take 100 mcg by mouth daily before breakfast.   ondansetron 4 MG tablet Commonly known as: ZOFRAN Take 4 mg by mouth every 6 (six) hours as needed for vomiting or nausea/vomiting.   OSCAL 500/200 D-3 PO Take 1 tablet by mouth daily.   potassium chloride SA 20 MEQ tablet Commonly known as: KLOR-CON Take 20 mEq by mouth 2 (two) times daily.   Prodigy Lancing Device Misc See admin instructions.   Prodigy Twist Top Lancets 28G Misc CHECK BLOOD GLUCOSE (SUGAR) TWICE DAILY   TYLENOL 500 MG tablet Generic drug: acetaminophen Take 1,000 mg by mouth at bedtime.   vancomycin 50 mg/mL  oral solution Commonly known as: VANCOCIN Take 10 mLs (500 mg total) by mouth every 6 (six) hours for 14 days.   VITAMIN B 12 PO Take 1,000 mcg by mouth daily.   vitamin E 180 MG (400 UNITS) capsule Generic drug: vitamin E Take 400 Units by mouth daily.      Consultations:  GI  Procedures/Studies:   No results found.   Subjective: - no chest pain, shortness of breath, no abdominal pain, nausea or vomiting.   Discharge Exam: BP 114/78 (BP Location: Right Arm)   Pulse 71   Temp (!) 97.3 F (36.3 C) (Oral)   Resp 20   Ht 5\' 1"  (1.549 m)   Wt 63.6 kg   SpO2 97%   BMI 26.51 kg/m   General: Pt is alert, awake, not in acute distress Cardiovascular: RRR, S1/S2 +, no rubs, no gallops Respiratory: CTA bilaterally, no wheezing, no rhonchi Abdominal: Soft, NT, ND, bowel sounds + Extremities: no edema, no cyanosis    The results of significant diagnostics from this hospitalization (including imaging, microbiology, ancillary and laboratory) are listed below for reference.     Microbiology: Recent Results (from the past 240 hour(s))  Resp Panel by RT-PCR (Flu A&B, Covid) Nasopharyngeal Swab     Status: None   Collection Time: 03/08/20  6:02 PM   Specimen:  Nasopharyngeal Swab; Nasopharyngeal(NP) swabs in vial transport medium  Result Value Ref Range Status   SARS Coronavirus 2 by RT PCR NEGATIVE NEGATIVE Final    Comment: (NOTE) SARS-CoV-2 target nucleic acids are NOT DETECTED.  The SARS-CoV-2 RNA is generally detectable in upper respiratory specimens during the acute phase of infection. The lowest concentration of SARS-CoV-2 viral copies this assay can detect is 138 copies/mL. A negative result does not preclude SARS-Cov-2 infection and should not be used as the sole basis for treatment or other patient management decisions. A negative result may occur with  improper specimen collection/handling, submission of specimen other than nasopharyngeal swab, presence of viral mutation(s) within the areas targeted by this assay, and inadequate number of viral copies(<138 copies/mL). A negative result must be combined with clinical observations, patient history, and epidemiological information. The expected result is Negative.  Fact Sheet for Patients:  EntrepreneurPulse.com.au  Fact Sheet for Healthcare Providers:  IncredibleEmployment.be  This test is no t yet approved or cleared by the [place] FDA and  has been authorized for detection and/or diagnosis of  SARS-CoV-2 by FDA under an Emergency Use Authorization (EUA). This EUA will remain  in effect (meaning this test can be used) for the duration of the COVID-19 declaration under Section 564(b)(1) of the Act, 21 U.S.C.section 360bbb-3(b)(1), unless the authorization is terminated  or revoked sooner.       Influenza A by PCR NEGATIVE NEGATIVE Final   Influenza B by PCR NEGATIVE NEGATIVE Final    Comment: (NOTE) The Xpert Xpress SARS-CoV-2/FLU/RSV plus assay is intended as an aid in the diagnosis of influenza from Nasopharyngeal swab specimens and should not be used as a sole basis for treatment. Nasal washings and aspirates are unacceptable for  Xpert Xpress SARS-CoV-2/FLU/RSV testing.  Fact Sheet for Patients: EntrepreneurPulse.com.au  Fact Sheet for Healthcare Providers: IncredibleEmployment.be  This test is not yet approved or cleared by the [place] FDA and has been authorized for detection and/or diagnosis of SARS-CoV-2 by FDA under an Emergency Use Authorization (EUA). This EUA will remain in effect (meaning this test can be used) for the duration of the COVID-19 declaration under Section 564(b)(1) of the Act, 21 U.S.C. section 360bbb-3(b)(1), unless the authorization is terminated or revoked.  Performed at [facility], [place] [address] [place], [place] [address]   Resp Panel by RT-PCR (Flu A&B, Covid) Nasopharyngeal Swab     Status: None   Collection Time: 03/11/20  9:26 AM   Specimen: Nasopharyngeal Swab; Nasopharyngeal(NP) swabs in vial transport medium  Result Value Ref Range Status   SARS Coronavirus 2 by RT PCR NEGATIVE NEGATIVE Final    Comment: (NOTE) SARS-CoV-2 target nucleic acids are NOT DETECTED.  The SARS-CoV-2 RNA is generally detectable in upper respiratory specimens during the acute phase of infection. The lowest concentration of SARS-CoV-2 viral copies this assay can detect is 138 copies/mL. A negative result does not preclude SARS-Cov-2 infection and should not be used as the sole basis for treatment or other patient management decisions. A negative result may occur with  improper specimen collection/handling, submission of specimen other than nasopharyngeal swab, presence of viral mutation(s) within the areas targeted by this assay, and inadequate number of viral copies(<138 copies/mL). A negative result must be combined with clinical observations, patient history, and epidemiological information. The expected result is Negative.  Fact Sheet for Patients:  EntrepreneurPulse.com.au  Fact Sheet for Healthcare  Providers:  IncredibleEmployment.be  This test is no t yet approved or cleared by the [place] FDA and  has been authorized for detection and/or diagnosis of SARS-CoV-2 by FDA under an Emergency Use Authorization (EUA). This EUA will remain  in effect (meaning this test can be used) for the duration of the COVID-19 declaration under Section 564(b)(1) of the Act, 21 U.S.C.section 360bbb-3(b)(1), unless the authorization is terminated  or revoked sooner.       Influenza A by PCR NEGATIVE NEGATIVE Final   Influenza B by PCR NEGATIVE NEGATIVE Final    Comment: (NOTE) The Xpert Xpress SARS-CoV-2/FLU/RSV plus assay is intended as an aid in the diagnosis of influenza from Nasopharyngeal swab specimens and should not be used as a sole basis for treatment. Nasal washings and aspirates are unacceptable for Xpert Xpress SARS-CoV-2/FLU/RSV testing.  Fact Sheet for Patients: EntrepreneurPulse.com.au  Fact Sheet for Healthcare Providers: IncredibleEmployment.be  This test is not yet approved or cleared by the [place] FDA and has been authorized for detection and/or diagnosis of SARS-CoV-2 by FDA under an Emergency Use Authorization (EUA). This EUA will remain in effect (meaning this test can be used)  for the duration of the COVID-19 declaration under Section 564(b)(1) of the Act, 21 U.S.C. section 360bbb-3(b)(1), unless the authorization is terminated or revoked.  Performed at [facility], [place] [address], [place], [place] [address]      Labs: Basic Metabolic Panel: Recent Labs  Lab 03/08/20 1420 03/09/20 0442 03/10/20 0418  NA 133* 136 133*  K 4.4 4.4 4.3  CL 98 100 100  CO2 23 25 27   GLUCOSE 235* 95 77  BUN 20 20 15   CREATININE 1.21* 1.08* 1.19*  CALCIUM 7.9* 8.1* 8.1*   Liver Function Tests: Recent Labs  Lab 03/08/20 1420 03/09/20 0442 03/10/20 0418  AST 35 28 24  ALT 26 23 18    ALKPHOS 79 71 74  BILITOT 0.5 0.8 0.6  PROT 5.9* 5.9* 5.4*  ALBUMIN 2.3* 2.3* 2.2*   CBC: Recent Labs  Lab 03/08/20 1420 03/09/20 0442 03/10/20 0418  WBC 13.2* 12.1* 7.0  NEUTROABS 9.3*  --   --   HGB 9.4* 9.7* 9.0*  HCT 30.6* 31.6* 29.8*  MCV 87.7 88.8 88.4  PLT 498* 417* 353   CBG: Recent Labs  Lab 03/10/20 1057 03/10/20 1641 03/10/20 2119 03/11/20 0827 03/11/20 1116  GLUCAP 129* 243* 112* 92 150*   Hgb A1c Recent Labs    03/09/20 0442  HGBA1C 6.8*   Lipid Profile No results for input(s): CHOL, HDL, LDLCALC, TRIG, CHOLHDL, LDLDIRECT in the last 72 hours. Thyroid function studies No results for input(s): TSH, T4TOTAL, T3FREE, THYROIDAB in the last 72 hours.  Invalid input(s): FREET3 Urinalysis    Component Value Date/Time   COLORURINE YELLOW 02/10/2020 [place] 02/10/2020 1514   LABSPEC 1.015 02/10/2020 1514   PHURINE 5.0 02/10/2020 [place] 02/10/2020 1514   HGBUR NEGATIVE 02/10/2020 [place] 02/10/2020 1514   KETONESUR NEGATIVE 02/10/2020 1514   PROTEINUR NEGATIVE 02/10/2020 1514   UROBILINOGEN 0.2 10/22/2010 1936   NITRITE NEGATIVE 02/10/2020 1514   LEUKOCYTESUR NEGATIVE 02/10/2020 1514    FURTHER DISCHARGE INSTRUCTIONS:   Get Medicines reviewed and adjusted: Please take all your medications with you for your next visit with your Primary MD   Laboratory/radiological data: Please request your Primary MD to go over all hospital tests and procedure/radiological results at the follow up, please ask your Primary MD to get all Hospital records sent to his/her office.   In some cases, they will be blood work, cultures and biopsy results pending at the time of your discharge. Please request that your primary care M.D. goes through all the records of your hospital data and follows up on these results.   Also Note the following: If you experience worsening of your admission symptoms, develop shortness  of breath, life threatening emergency, suicidal or homicidal thoughts you must seek medical attention immediately by calling 911 or calling your MD immediately  if symptoms less severe.   You must read complete instructions/literature along with all the possible adverse reactions/side effects for all the Medicines you take and that have been prescribed to you. Take any new Medicines after you have completely understood and accpet all the possible adverse reactions/side effects.    Do not drive when taking Pain medications or sleeping medications (Benzodaizepines)   Do not take more than prescribed Pain, Sleep and Anxiety Medications. It is not advisable to combine anxiety,sleep and pain medications without talking with your primary care practitioner   Special Instructions: If you have smoked or chewed Tobacco  in  the last 2 yrs please stop smoking, stop any regular Alcohol  and or any Recreational drug use.   Wear Seat belts while driving.   Please note: You were cared for by a hospitalist during your hospital stay. Once you are discharged, your primary care physician will handle any further medical issues. Please note that NO REFILLS for any discharge medications will be authorized once you are discharged, as it is imperative that you return to your primary care physician (or establish a relationship with a primary care physician if you do not have one) for your post hospital discharge needs so that they can reassess your need for medications and monitor your lab values.  Time coordinating discharge: 35 minutes  SIGNED:  Marzetta Board, MD, PhD 03/11/2020, 11:53 AM

## 2020-03-11 NOTE — Plan of Care (Signed)
  Problem: Education: Goal: Knowledge of General Education information will improve Description Including pain rating scale, medication(s)/side effects and non-pharmacologic comfort measures Outcome: Progressing   Problem: Health Behavior/Discharge Planning: Goal: Ability to manage health-related needs will improve Outcome: Progressing   

## 2020-03-15 DIAGNOSIS — R339 Retention of urine, unspecified: Secondary | ICD-10-CM | POA: Diagnosis not present

## 2020-03-15 DIAGNOSIS — D649 Anemia, unspecified: Secondary | ICD-10-CM | POA: Diagnosis not present

## 2020-03-15 DIAGNOSIS — I4891 Unspecified atrial fibrillation: Secondary | ICD-10-CM | POA: Diagnosis not present

## 2020-04-07 DIAGNOSIS — L89610 Pressure ulcer of right heel, unstageable: Secondary | ICD-10-CM | POA: Diagnosis not present

## 2020-04-14 DIAGNOSIS — L89610 Pressure ulcer of right heel, unstageable: Secondary | ICD-10-CM | POA: Diagnosis not present

## 2020-04-19 ENCOUNTER — Ambulatory Visit: Payer: Medicare Other | Admitting: Podiatry

## 2020-04-21 DIAGNOSIS — L89610 Pressure ulcer of right heel, unstageable: Secondary | ICD-10-CM | POA: Diagnosis not present

## 2020-04-28 DIAGNOSIS — E785 Hyperlipidemia, unspecified: Secondary | ICD-10-CM | POA: Diagnosis not present

## 2020-04-28 DIAGNOSIS — L89610 Pressure ulcer of right heel, unstageable: Secondary | ICD-10-CM | POA: Diagnosis not present

## 2020-04-28 DIAGNOSIS — I482 Chronic atrial fibrillation, unspecified: Secondary | ICD-10-CM | POA: Diagnosis not present

## 2020-04-28 DIAGNOSIS — E1122 Type 2 diabetes mellitus with diabetic chronic kidney disease: Secondary | ICD-10-CM | POA: Diagnosis not present

## 2020-04-28 DIAGNOSIS — D649 Anemia, unspecified: Secondary | ICD-10-CM | POA: Diagnosis not present

## 2020-04-28 DIAGNOSIS — I5030 Unspecified diastolic (congestive) heart failure: Secondary | ICD-10-CM | POA: Diagnosis not present

## 2020-04-28 DIAGNOSIS — L89620 Pressure ulcer of left heel, unstageable: Secondary | ICD-10-CM | POA: Diagnosis not present

## 2020-04-28 DIAGNOSIS — E039 Hypothyroidism, unspecified: Secondary | ICD-10-CM | POA: Diagnosis not present

## 2020-04-28 DIAGNOSIS — M1990 Unspecified osteoarthritis, unspecified site: Secondary | ICD-10-CM | POA: Diagnosis not present

## 2020-04-28 DIAGNOSIS — N183 Chronic kidney disease, stage 3 unspecified: Secondary | ICD-10-CM | POA: Diagnosis not present

## 2020-04-28 DIAGNOSIS — I10 Essential (primary) hypertension: Secondary | ICD-10-CM | POA: Diagnosis not present

## 2020-05-01 DIAGNOSIS — Z950 Presence of cardiac pacemaker: Secondary | ICD-10-CM | POA: Diagnosis not present

## 2020-05-01 DIAGNOSIS — I4891 Unspecified atrial fibrillation: Secondary | ICD-10-CM | POA: Diagnosis not present

## 2020-05-01 DIAGNOSIS — I130 Hypertensive heart and chronic kidney disease with heart failure and stage 1 through stage 4 chronic kidney disease, or unspecified chronic kidney disease: Secondary | ICD-10-CM | POA: Diagnosis not present

## 2020-05-01 DIAGNOSIS — E039 Hypothyroidism, unspecified: Secondary | ICD-10-CM | POA: Diagnosis not present

## 2020-05-01 DIAGNOSIS — N189 Chronic kidney disease, unspecified: Secondary | ICD-10-CM | POA: Diagnosis not present

## 2020-05-01 DIAGNOSIS — L89610 Pressure ulcer of right heel, unstageable: Secondary | ICD-10-CM | POA: Diagnosis not present

## 2020-05-01 DIAGNOSIS — J849 Interstitial pulmonary disease, unspecified: Secondary | ICD-10-CM | POA: Diagnosis not present

## 2020-05-01 DIAGNOSIS — D631 Anemia in chronic kidney disease: Secondary | ICD-10-CM | POA: Diagnosis not present

## 2020-05-01 DIAGNOSIS — Z9181 History of falling: Secondary | ICD-10-CM | POA: Diagnosis not present

## 2020-05-01 DIAGNOSIS — Z7982 Long term (current) use of aspirin: Secondary | ICD-10-CM | POA: Diagnosis not present

## 2020-05-01 DIAGNOSIS — E1122 Type 2 diabetes mellitus with diabetic chronic kidney disease: Secondary | ICD-10-CM | POA: Diagnosis not present

## 2020-05-01 DIAGNOSIS — I5032 Chronic diastolic (congestive) heart failure: Secondary | ICD-10-CM | POA: Diagnosis not present

## 2020-05-01 DIAGNOSIS — Z7984 Long term (current) use of oral hypoglycemic drugs: Secondary | ICD-10-CM | POA: Diagnosis not present

## 2020-05-01 DIAGNOSIS — L89620 Pressure ulcer of left heel, unstageable: Secondary | ICD-10-CM | POA: Diagnosis not present

## 2020-05-01 DIAGNOSIS — M84451D Pathological fracture, right femur, subsequent encounter for fracture with routine healing: Secondary | ICD-10-CM | POA: Diagnosis not present

## 2020-05-04 DIAGNOSIS — Z7984 Long term (current) use of oral hypoglycemic drugs: Secondary | ICD-10-CM | POA: Diagnosis not present

## 2020-05-04 DIAGNOSIS — E039 Hypothyroidism, unspecified: Secondary | ICD-10-CM | POA: Diagnosis not present

## 2020-05-04 DIAGNOSIS — L89620 Pressure ulcer of left heel, unstageable: Secondary | ICD-10-CM | POA: Diagnosis not present

## 2020-05-04 DIAGNOSIS — Z950 Presence of cardiac pacemaker: Secondary | ICD-10-CM | POA: Diagnosis not present

## 2020-05-04 DIAGNOSIS — I4891 Unspecified atrial fibrillation: Secondary | ICD-10-CM | POA: Diagnosis not present

## 2020-05-04 DIAGNOSIS — N189 Chronic kidney disease, unspecified: Secondary | ICD-10-CM | POA: Diagnosis not present

## 2020-05-04 DIAGNOSIS — M84451D Pathological fracture, right femur, subsequent encounter for fracture with routine healing: Secondary | ICD-10-CM | POA: Diagnosis not present

## 2020-05-04 DIAGNOSIS — Z7982 Long term (current) use of aspirin: Secondary | ICD-10-CM | POA: Diagnosis not present

## 2020-05-04 DIAGNOSIS — I5032 Chronic diastolic (congestive) heart failure: Secondary | ICD-10-CM | POA: Diagnosis not present

## 2020-05-04 DIAGNOSIS — D631 Anemia in chronic kidney disease: Secondary | ICD-10-CM | POA: Diagnosis not present

## 2020-05-04 DIAGNOSIS — L89610 Pressure ulcer of right heel, unstageable: Secondary | ICD-10-CM | POA: Diagnosis not present

## 2020-05-04 DIAGNOSIS — J849 Interstitial pulmonary disease, unspecified: Secondary | ICD-10-CM | POA: Diagnosis not present

## 2020-05-04 DIAGNOSIS — Z9181 History of falling: Secondary | ICD-10-CM | POA: Diagnosis not present

## 2020-05-04 DIAGNOSIS — I130 Hypertensive heart and chronic kidney disease with heart failure and stage 1 through stage 4 chronic kidney disease, or unspecified chronic kidney disease: Secondary | ICD-10-CM | POA: Diagnosis not present

## 2020-05-04 DIAGNOSIS — E1122 Type 2 diabetes mellitus with diabetic chronic kidney disease: Secondary | ICD-10-CM | POA: Diagnosis not present

## 2020-05-05 DIAGNOSIS — I130 Hypertensive heart and chronic kidney disease with heart failure and stage 1 through stage 4 chronic kidney disease, or unspecified chronic kidney disease: Secondary | ICD-10-CM | POA: Diagnosis not present

## 2020-05-05 DIAGNOSIS — M84451D Pathological fracture, right femur, subsequent encounter for fracture with routine healing: Secondary | ICD-10-CM | POA: Diagnosis not present

## 2020-05-05 DIAGNOSIS — Z9181 History of falling: Secondary | ICD-10-CM | POA: Diagnosis not present

## 2020-05-05 DIAGNOSIS — Z7982 Long term (current) use of aspirin: Secondary | ICD-10-CM | POA: Diagnosis not present

## 2020-05-05 DIAGNOSIS — N189 Chronic kidney disease, unspecified: Secondary | ICD-10-CM | POA: Diagnosis not present

## 2020-05-05 DIAGNOSIS — I4891 Unspecified atrial fibrillation: Secondary | ICD-10-CM | POA: Diagnosis not present

## 2020-05-05 DIAGNOSIS — D631 Anemia in chronic kidney disease: Secondary | ICD-10-CM | POA: Diagnosis not present

## 2020-05-05 DIAGNOSIS — Z950 Presence of cardiac pacemaker: Secondary | ICD-10-CM | POA: Diagnosis not present

## 2020-05-05 DIAGNOSIS — I5032 Chronic diastolic (congestive) heart failure: Secondary | ICD-10-CM | POA: Diagnosis not present

## 2020-05-05 DIAGNOSIS — E039 Hypothyroidism, unspecified: Secondary | ICD-10-CM | POA: Diagnosis not present

## 2020-05-05 DIAGNOSIS — J849 Interstitial pulmonary disease, unspecified: Secondary | ICD-10-CM | POA: Diagnosis not present

## 2020-05-05 DIAGNOSIS — E1122 Type 2 diabetes mellitus with diabetic chronic kidney disease: Secondary | ICD-10-CM | POA: Diagnosis not present

## 2020-05-05 DIAGNOSIS — Z7984 Long term (current) use of oral hypoglycemic drugs: Secondary | ICD-10-CM | POA: Diagnosis not present

## 2020-05-05 DIAGNOSIS — L89610 Pressure ulcer of right heel, unstageable: Secondary | ICD-10-CM | POA: Diagnosis not present

## 2020-05-05 DIAGNOSIS — L89620 Pressure ulcer of left heel, unstageable: Secondary | ICD-10-CM | POA: Diagnosis not present

## 2020-05-09 ENCOUNTER — Telehealth: Payer: Self-pay | Admitting: *Deleted

## 2020-05-09 DIAGNOSIS — I5032 Chronic diastolic (congestive) heart failure: Secondary | ICD-10-CM | POA: Diagnosis not present

## 2020-05-09 DIAGNOSIS — I4891 Unspecified atrial fibrillation: Secondary | ICD-10-CM | POA: Diagnosis not present

## 2020-05-09 DIAGNOSIS — J849 Interstitial pulmonary disease, unspecified: Secondary | ICD-10-CM | POA: Diagnosis not present

## 2020-05-09 DIAGNOSIS — I130 Hypertensive heart and chronic kidney disease with heart failure and stage 1 through stage 4 chronic kidney disease, or unspecified chronic kidney disease: Secondary | ICD-10-CM | POA: Diagnosis not present

## 2020-05-09 DIAGNOSIS — M84451D Pathological fracture, right femur, subsequent encounter for fracture with routine healing: Secondary | ICD-10-CM | POA: Diagnosis not present

## 2020-05-09 DIAGNOSIS — Z7982 Long term (current) use of aspirin: Secondary | ICD-10-CM | POA: Diagnosis not present

## 2020-05-09 DIAGNOSIS — D631 Anemia in chronic kidney disease: Secondary | ICD-10-CM | POA: Diagnosis not present

## 2020-05-09 DIAGNOSIS — E1122 Type 2 diabetes mellitus with diabetic chronic kidney disease: Secondary | ICD-10-CM | POA: Diagnosis not present

## 2020-05-09 DIAGNOSIS — L89620 Pressure ulcer of left heel, unstageable: Secondary | ICD-10-CM | POA: Diagnosis not present

## 2020-05-09 DIAGNOSIS — Z950 Presence of cardiac pacemaker: Secondary | ICD-10-CM | POA: Diagnosis not present

## 2020-05-09 DIAGNOSIS — Z9181 History of falling: Secondary | ICD-10-CM | POA: Diagnosis not present

## 2020-05-09 DIAGNOSIS — E039 Hypothyroidism, unspecified: Secondary | ICD-10-CM | POA: Diagnosis not present

## 2020-05-09 DIAGNOSIS — N189 Chronic kidney disease, unspecified: Secondary | ICD-10-CM | POA: Diagnosis not present

## 2020-05-09 DIAGNOSIS — Z7984 Long term (current) use of oral hypoglycemic drugs: Secondary | ICD-10-CM | POA: Diagnosis not present

## 2020-05-09 DIAGNOSIS — L89610 Pressure ulcer of right heel, unstageable: Secondary | ICD-10-CM | POA: Diagnosis not present

## 2020-05-09 NOTE — Telephone Encounter (Signed)
Patient's daughter called [facility]) and left a message and stated that her mom fractured her hip and now she has two bed sores on both heels and one is oozing and the other is black and is concerned and I tried to call the patient's daughter back and had to leave a message stating that we put patient in on Wednesday with Dr Amalia Hailey at 11:30 am and if that was not ok to call the office back. Lattie Haw

## 2020-05-10 ENCOUNTER — Telehealth: Payer: Self-pay | Admitting: *Deleted

## 2020-05-10 NOTE — Telephone Encounter (Signed)
Ann Fletcher(daughter) is concerned about patient, she has B/L bedsores on heels,one is  black and the other is oozing pus.Please advise.  Called patient's daughter to schedule appointment , no answer, left VM message to call back to confirm.

## 2020-05-10 NOTE — Telephone Encounter (Signed)
Called and spoke with patient, to schedule appointment for 05/11/20@9 :30 with Dr Amalia Hailey.

## 2020-05-10 NOTE — Telephone Encounter (Signed)
Called and left Vmessage for daughter([name]) that an appointment has scheduled w/ Dr Amalia Hailey for B/L bedsores 05/10/20@9 :30. I also called and spoke with patient and informed,verbalized understanding.

## 2020-05-11 ENCOUNTER — Other Ambulatory Visit: Payer: Self-pay

## 2020-05-11 ENCOUNTER — Ambulatory Visit (INDEPENDENT_AMBULATORY_CARE_PROVIDER_SITE_OTHER): Payer: Medicare Other | Admitting: Podiatry

## 2020-05-11 DIAGNOSIS — L89623 Pressure ulcer of left heel, stage 3: Secondary | ICD-10-CM | POA: Diagnosis not present

## 2020-05-11 DIAGNOSIS — L89613 Pressure ulcer of right heel, stage 3: Secondary | ICD-10-CM | POA: Diagnosis not present

## 2020-05-13 DIAGNOSIS — Z950 Presence of cardiac pacemaker: Secondary | ICD-10-CM | POA: Diagnosis not present

## 2020-05-13 DIAGNOSIS — N189 Chronic kidney disease, unspecified: Secondary | ICD-10-CM | POA: Diagnosis not present

## 2020-05-13 DIAGNOSIS — M84451D Pathological fracture, right femur, subsequent encounter for fracture with routine healing: Secondary | ICD-10-CM | POA: Diagnosis not present

## 2020-05-13 DIAGNOSIS — I4891 Unspecified atrial fibrillation: Secondary | ICD-10-CM | POA: Diagnosis not present

## 2020-05-13 DIAGNOSIS — J849 Interstitial pulmonary disease, unspecified: Secondary | ICD-10-CM | POA: Diagnosis not present

## 2020-05-13 DIAGNOSIS — L89620 Pressure ulcer of left heel, unstageable: Secondary | ICD-10-CM | POA: Diagnosis not present

## 2020-05-13 DIAGNOSIS — L89610 Pressure ulcer of right heel, unstageable: Secondary | ICD-10-CM | POA: Diagnosis not present

## 2020-05-13 DIAGNOSIS — E039 Hypothyroidism, unspecified: Secondary | ICD-10-CM | POA: Diagnosis not present

## 2020-05-13 DIAGNOSIS — I130 Hypertensive heart and chronic kidney disease with heart failure and stage 1 through stage 4 chronic kidney disease, or unspecified chronic kidney disease: Secondary | ICD-10-CM | POA: Diagnosis not present

## 2020-05-13 DIAGNOSIS — Z9181 History of falling: Secondary | ICD-10-CM | POA: Diagnosis not present

## 2020-05-13 DIAGNOSIS — D631 Anemia in chronic kidney disease: Secondary | ICD-10-CM | POA: Diagnosis not present

## 2020-05-13 DIAGNOSIS — Z7982 Long term (current) use of aspirin: Secondary | ICD-10-CM | POA: Diagnosis not present

## 2020-05-13 DIAGNOSIS — E1122 Type 2 diabetes mellitus with diabetic chronic kidney disease: Secondary | ICD-10-CM | POA: Diagnosis not present

## 2020-05-13 DIAGNOSIS — Z7984 Long term (current) use of oral hypoglycemic drugs: Secondary | ICD-10-CM | POA: Diagnosis not present

## 2020-05-13 DIAGNOSIS — I5032 Chronic diastolic (congestive) heart failure: Secondary | ICD-10-CM | POA: Diagnosis not present

## 2020-05-16 ENCOUNTER — Ambulatory Visit: Payer: Medicare Other | Admitting: Podiatry

## 2020-05-16 DIAGNOSIS — I5032 Chronic diastolic (congestive) heart failure: Secondary | ICD-10-CM | POA: Diagnosis not present

## 2020-05-16 DIAGNOSIS — Z7982 Long term (current) use of aspirin: Secondary | ICD-10-CM | POA: Diagnosis not present

## 2020-05-16 DIAGNOSIS — D631 Anemia in chronic kidney disease: Secondary | ICD-10-CM | POA: Diagnosis not present

## 2020-05-16 DIAGNOSIS — L89610 Pressure ulcer of right heel, unstageable: Secondary | ICD-10-CM | POA: Diagnosis not present

## 2020-05-16 DIAGNOSIS — M84451D Pathological fracture, right femur, subsequent encounter for fracture with routine healing: Secondary | ICD-10-CM | POA: Diagnosis not present

## 2020-05-16 DIAGNOSIS — J849 Interstitial pulmonary disease, unspecified: Secondary | ICD-10-CM | POA: Diagnosis not present

## 2020-05-16 DIAGNOSIS — E039 Hypothyroidism, unspecified: Secondary | ICD-10-CM | POA: Diagnosis not present

## 2020-05-16 DIAGNOSIS — Z7984 Long term (current) use of oral hypoglycemic drugs: Secondary | ICD-10-CM | POA: Diagnosis not present

## 2020-05-16 DIAGNOSIS — N189 Chronic kidney disease, unspecified: Secondary | ICD-10-CM | POA: Diagnosis not present

## 2020-05-16 DIAGNOSIS — I4891 Unspecified atrial fibrillation: Secondary | ICD-10-CM | POA: Diagnosis not present

## 2020-05-16 DIAGNOSIS — I130 Hypertensive heart and chronic kidney disease with heart failure and stage 1 through stage 4 chronic kidney disease, or unspecified chronic kidney disease: Secondary | ICD-10-CM | POA: Diagnosis not present

## 2020-05-16 DIAGNOSIS — L89620 Pressure ulcer of left heel, unstageable: Secondary | ICD-10-CM | POA: Diagnosis not present

## 2020-05-16 DIAGNOSIS — Z9181 History of falling: Secondary | ICD-10-CM | POA: Diagnosis not present

## 2020-05-16 DIAGNOSIS — E1122 Type 2 diabetes mellitus with diabetic chronic kidney disease: Secondary | ICD-10-CM | POA: Diagnosis not present

## 2020-05-16 DIAGNOSIS — Z950 Presence of cardiac pacemaker: Secondary | ICD-10-CM | POA: Diagnosis not present

## 2020-05-17 DIAGNOSIS — S72141A Displaced intertrochanteric fracture of right femur, initial encounter for closed fracture: Secondary | ICD-10-CM | POA: Diagnosis not present

## 2020-05-17 NOTE — Progress Notes (Signed)
   HPI: 87 y.o. female presenting today for evaluation of blisters that have developed to the bilateral heels.  She was last seen in the office on 10/07/2019.  Patient states that she has developed symptomatic painful pressure sores to the heels posteriorly.  There is a dark discoloration to the skin with drainage to the left heel.  Patient states that she is diabetic.  She has a nurse come out to the home every Monday for dressing changes.  Patient presents today nonambulatory in a wheelchair.  Past Medical History:  Diagnosis Date  . Anxiety   . Arthritis   . AV node arrhythmia    a. s/p AV node ablation/PPM 07/2017.  [name] Chronic diastolic CHF (congestive heart failure) ([place])    a. Acute exacerbation occurred in setting of AF.  [name] CKD (chronic kidney disease), stage III ([place])   . Diabetes mellitus without complication ([place])    type 2  . 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 H/O bladder infections   . Hyperlipidemia   . Hypertension   . Hypothyroidism   . Lower extremity edema   . Osteoporosis   . Persistent atrial fibrillation ([place])    a. diagnosed in 10/2014 by PCP, underwent failed TEE DCCV on 10/08/14, loaded with amio, successfully DCCV on 10/15/14.         Physical Exam: General: The patient is alert and oriented x3 in no acute distress.  Dermatology: Wound #1 noted to the right posterior heel measuring approximately 5.0x3.0 (LxW) unstageable depth.  There is a well adhered overlying eschar noted.  No malodor noted.  No drainage noted.  Wound #2 noted to the left posterior heel measuring approximately 2.0 x 2.0 x 0.3 cm.  To the noted ulceration there is a an extensive amount of slough fibrotic tissue to the wound base.  No granulation tissue really noted.  There is not appear to be any exposed bone muscle tendon ligament or joint.  There is some moderate serosanguineous drainage noted.  No malodor noted.  Periwound integrity is slightly macerated.  Vascular: Palpable pedal  pulses bilaterally. No edema or erythema noted. Capillary refill within normal limits.  Neurological: Epicritic and protective threshold grossly intact bilaterally.   Musculoskeletal Exam: Patient is nonambulatory in a wheelchair.  No pedal deformities noted bilateral lower extremities  Assessment: 1.  Pressure ulcers posterior heels bilateral   Plan of Care:  1. Patient evaluated.   2.  At the moment we are going to recommend Betadine with dry sterile dressing every other day. 3.  Continue offloading bunny boots 4.  Return to clinic in 3 weeks      Edrick Kins, DPM [facility]  Dr. Edrick Kins, DPM    [address], [place] [address]                Office [phone]  Fax [phone]

## 2020-05-19 DIAGNOSIS — N1832 Chronic kidney disease, stage 3b: Secondary | ICD-10-CM | POA: Diagnosis not present

## 2020-05-19 DIAGNOSIS — Z7982 Long term (current) use of aspirin: Secondary | ICD-10-CM | POA: Diagnosis not present

## 2020-05-19 DIAGNOSIS — D631 Anemia in chronic kidney disease: Secondary | ICD-10-CM | POA: Diagnosis not present

## 2020-05-19 DIAGNOSIS — E039 Hypothyroidism, unspecified: Secondary | ICD-10-CM | POA: Diagnosis not present

## 2020-05-19 DIAGNOSIS — E1121 Type 2 diabetes mellitus with diabetic nephropathy: Secondary | ICD-10-CM | POA: Diagnosis not present

## 2020-05-19 DIAGNOSIS — I300 Acute nonspecific idiopathic pericarditis: Secondary | ICD-10-CM | POA: Diagnosis not present

## 2020-05-19 DIAGNOSIS — M84451D Pathological fracture, right femur, subsequent encounter for fracture with routine healing: Secondary | ICD-10-CM | POA: Diagnosis not present

## 2020-05-19 DIAGNOSIS — R269 Unspecified abnormalities of gait and mobility: Secondary | ICD-10-CM | POA: Diagnosis not present

## 2020-05-19 DIAGNOSIS — L89620 Pressure ulcer of left heel, unstageable: Secondary | ICD-10-CM | POA: Diagnosis not present

## 2020-05-19 DIAGNOSIS — L89610 Pressure ulcer of right heel, unstageable: Secondary | ICD-10-CM | POA: Diagnosis not present

## 2020-05-19 DIAGNOSIS — J849 Interstitial pulmonary disease, unspecified: Secondary | ICD-10-CM | POA: Diagnosis not present

## 2020-05-19 DIAGNOSIS — Z9181 History of falling: Secondary | ICD-10-CM | POA: Diagnosis not present

## 2020-05-19 DIAGNOSIS — Z7984 Long term (current) use of oral hypoglycemic drugs: Secondary | ICD-10-CM | POA: Diagnosis not present

## 2020-05-19 DIAGNOSIS — I10 Essential (primary) hypertension: Secondary | ICD-10-CM | POA: Diagnosis not present

## 2020-05-19 DIAGNOSIS — N189 Chronic kidney disease, unspecified: Secondary | ICD-10-CM | POA: Diagnosis not present

## 2020-05-19 DIAGNOSIS — I130 Hypertensive heart and chronic kidney disease with heart failure and stage 1 through stage 4 chronic kidney disease, or unspecified chronic kidney disease: Secondary | ICD-10-CM | POA: Diagnosis not present

## 2020-05-19 DIAGNOSIS — I4891 Unspecified atrial fibrillation: Secondary | ICD-10-CM | POA: Diagnosis not present

## 2020-05-19 DIAGNOSIS — E1122 Type 2 diabetes mellitus with diabetic chronic kidney disease: Secondary | ICD-10-CM | POA: Diagnosis not present

## 2020-05-19 DIAGNOSIS — Z950 Presence of cardiac pacemaker: Secondary | ICD-10-CM | POA: Diagnosis not present

## 2020-05-19 DIAGNOSIS — I5032 Chronic diastolic (congestive) heart failure: Secondary | ICD-10-CM | POA: Diagnosis not present

## 2020-05-23 DIAGNOSIS — M84451D Pathological fracture, right femur, subsequent encounter for fracture with routine healing: Secondary | ICD-10-CM | POA: Diagnosis not present

## 2020-05-23 DIAGNOSIS — N189 Chronic kidney disease, unspecified: Secondary | ICD-10-CM | POA: Diagnosis not present

## 2020-05-23 DIAGNOSIS — I4891 Unspecified atrial fibrillation: Secondary | ICD-10-CM | POA: Diagnosis not present

## 2020-05-23 DIAGNOSIS — Z7982 Long term (current) use of aspirin: Secondary | ICD-10-CM | POA: Diagnosis not present

## 2020-05-23 DIAGNOSIS — E039 Hypothyroidism, unspecified: Secondary | ICD-10-CM | POA: Diagnosis not present

## 2020-05-23 DIAGNOSIS — J849 Interstitial pulmonary disease, unspecified: Secondary | ICD-10-CM | POA: Diagnosis not present

## 2020-05-23 DIAGNOSIS — Z950 Presence of cardiac pacemaker: Secondary | ICD-10-CM | POA: Diagnosis not present

## 2020-05-23 DIAGNOSIS — Z7984 Long term (current) use of oral hypoglycemic drugs: Secondary | ICD-10-CM | POA: Diagnosis not present

## 2020-05-23 DIAGNOSIS — D631 Anemia in chronic kidney disease: Secondary | ICD-10-CM | POA: Diagnosis not present

## 2020-05-23 DIAGNOSIS — I5032 Chronic diastolic (congestive) heart failure: Secondary | ICD-10-CM | POA: Diagnosis not present

## 2020-05-23 DIAGNOSIS — Z9181 History of falling: Secondary | ICD-10-CM | POA: Diagnosis not present

## 2020-05-23 DIAGNOSIS — L89610 Pressure ulcer of right heel, unstageable: Secondary | ICD-10-CM | POA: Diagnosis not present

## 2020-05-23 DIAGNOSIS — E1122 Type 2 diabetes mellitus with diabetic chronic kidney disease: Secondary | ICD-10-CM | POA: Diagnosis not present

## 2020-05-23 DIAGNOSIS — L89620 Pressure ulcer of left heel, unstageable: Secondary | ICD-10-CM | POA: Diagnosis not present

## 2020-05-23 DIAGNOSIS — I130 Hypertensive heart and chronic kidney disease with heart failure and stage 1 through stage 4 chronic kidney disease, or unspecified chronic kidney disease: Secondary | ICD-10-CM | POA: Diagnosis not present

## 2020-05-26 DIAGNOSIS — I5032 Chronic diastolic (congestive) heart failure: Secondary | ICD-10-CM | POA: Diagnosis not present

## 2020-05-26 DIAGNOSIS — Z7984 Long term (current) use of oral hypoglycemic drugs: Secondary | ICD-10-CM | POA: Diagnosis not present

## 2020-05-26 DIAGNOSIS — N189 Chronic kidney disease, unspecified: Secondary | ICD-10-CM | POA: Diagnosis not present

## 2020-05-26 DIAGNOSIS — E1122 Type 2 diabetes mellitus with diabetic chronic kidney disease: Secondary | ICD-10-CM | POA: Diagnosis not present

## 2020-05-26 DIAGNOSIS — L89610 Pressure ulcer of right heel, unstageable: Secondary | ICD-10-CM | POA: Diagnosis not present

## 2020-05-26 DIAGNOSIS — D631 Anemia in chronic kidney disease: Secondary | ICD-10-CM | POA: Diagnosis not present

## 2020-05-26 DIAGNOSIS — Z9181 History of falling: Secondary | ICD-10-CM | POA: Diagnosis not present

## 2020-05-26 DIAGNOSIS — I4891 Unspecified atrial fibrillation: Secondary | ICD-10-CM | POA: Diagnosis not present

## 2020-05-26 DIAGNOSIS — Z950 Presence of cardiac pacemaker: Secondary | ICD-10-CM | POA: Diagnosis not present

## 2020-05-26 DIAGNOSIS — M84451D Pathological fracture, right femur, subsequent encounter for fracture with routine healing: Secondary | ICD-10-CM | POA: Diagnosis not present

## 2020-05-26 DIAGNOSIS — I130 Hypertensive heart and chronic kidney disease with heart failure and stage 1 through stage 4 chronic kidney disease, or unspecified chronic kidney disease: Secondary | ICD-10-CM | POA: Diagnosis not present

## 2020-05-26 DIAGNOSIS — Z7982 Long term (current) use of aspirin: Secondary | ICD-10-CM | POA: Diagnosis not present

## 2020-05-26 DIAGNOSIS — L89620 Pressure ulcer of left heel, unstageable: Secondary | ICD-10-CM | POA: Diagnosis not present

## 2020-05-26 DIAGNOSIS — E039 Hypothyroidism, unspecified: Secondary | ICD-10-CM | POA: Diagnosis not present

## 2020-05-26 DIAGNOSIS — J849 Interstitial pulmonary disease, unspecified: Secondary | ICD-10-CM | POA: Diagnosis not present

## 2020-05-27 DIAGNOSIS — R338 Other retention of urine: Secondary | ICD-10-CM | POA: Diagnosis not present

## 2020-05-30 DIAGNOSIS — I130 Hypertensive heart and chronic kidney disease with heart failure and stage 1 through stage 4 chronic kidney disease, or unspecified chronic kidney disease: Secondary | ICD-10-CM | POA: Diagnosis not present

## 2020-05-30 DIAGNOSIS — Z7982 Long term (current) use of aspirin: Secondary | ICD-10-CM | POA: Diagnosis not present

## 2020-05-30 DIAGNOSIS — I5032 Chronic diastolic (congestive) heart failure: Secondary | ICD-10-CM | POA: Diagnosis not present

## 2020-05-30 DIAGNOSIS — Z950 Presence of cardiac pacemaker: Secondary | ICD-10-CM | POA: Diagnosis not present

## 2020-05-30 DIAGNOSIS — M84451D Pathological fracture, right femur, subsequent encounter for fracture with routine healing: Secondary | ICD-10-CM | POA: Diagnosis not present

## 2020-05-30 DIAGNOSIS — E039 Hypothyroidism, unspecified: Secondary | ICD-10-CM | POA: Diagnosis not present

## 2020-05-30 DIAGNOSIS — L89610 Pressure ulcer of right heel, unstageable: Secondary | ICD-10-CM | POA: Diagnosis not present

## 2020-05-30 DIAGNOSIS — I4891 Unspecified atrial fibrillation: Secondary | ICD-10-CM | POA: Diagnosis not present

## 2020-05-30 DIAGNOSIS — N189 Chronic kidney disease, unspecified: Secondary | ICD-10-CM | POA: Diagnosis not present

## 2020-05-30 DIAGNOSIS — Z7984 Long term (current) use of oral hypoglycemic drugs: Secondary | ICD-10-CM | POA: Diagnosis not present

## 2020-05-30 DIAGNOSIS — D631 Anemia in chronic kidney disease: Secondary | ICD-10-CM | POA: Diagnosis not present

## 2020-05-30 DIAGNOSIS — E1122 Type 2 diabetes mellitus with diabetic chronic kidney disease: Secondary | ICD-10-CM | POA: Diagnosis not present

## 2020-05-30 DIAGNOSIS — J849 Interstitial pulmonary disease, unspecified: Secondary | ICD-10-CM | POA: Diagnosis not present

## 2020-05-30 DIAGNOSIS — Z9181 History of falling: Secondary | ICD-10-CM | POA: Diagnosis not present

## 2020-05-30 DIAGNOSIS — L89620 Pressure ulcer of left heel, unstageable: Secondary | ICD-10-CM | POA: Diagnosis not present

## 2020-05-31 ENCOUNTER — Ambulatory Visit (INDEPENDENT_AMBULATORY_CARE_PROVIDER_SITE_OTHER): Payer: Medicare Other

## 2020-05-31 DIAGNOSIS — I4819 Other persistent atrial fibrillation: Secondary | ICD-10-CM | POA: Diagnosis not present

## 2020-05-31 LAB — CUP PACEART REMOTE DEVICE CHECK
Battery Remaining Longevity: 111 mo
Battery Voltage: 3 V
Brady Statistic AP VP Percent: 0 %
Brady Statistic AP VS Percent: 0 %
Brady Statistic AS VP Percent: 99.18 %
Brady Statistic AS VS Percent: 0.82 %
Brady Statistic RA Percent Paced: 0 %
Brady Statistic RV Percent Paced: 99.18 %
Date Time Interrogation Session: 20220228233805
Implantable Lead Implant Date: 20190524
Implantable Lead Implant Date: 20190524
Implantable Lead Location: 753859
Implantable Lead Location: 753860
Implantable Lead Model: 3830
Implantable Lead Model: 5076
Implantable Pulse Generator Implant Date: 20190524
Lead Channel Impedance Value: 380 Ohm
Lead Channel Impedance Value: 437 Ohm
Lead Channel Impedance Value: 513 Ohm
Lead Channel Impedance Value: 570 Ohm
Lead Channel Pacing Threshold Amplitude: 1 V
Lead Channel Pacing Threshold Pulse Width: 0.4 ms
Lead Channel Sensing Intrinsic Amplitude: 1.5 mV
Lead Channel Sensing Intrinsic Amplitude: 6 mV
Lead Channel Sensing Intrinsic Amplitude: 6 mV
Lead Channel Setting Pacing Amplitude: 2.5 V
Lead Channel Setting Pacing Pulse Width: 0.4 ms
Lead Channel Setting Sensing Sensitivity: 4 mV

## 2020-06-01 ENCOUNTER — Ambulatory Visit (INDEPENDENT_AMBULATORY_CARE_PROVIDER_SITE_OTHER): Payer: Medicare Other | Admitting: Podiatry

## 2020-06-01 ENCOUNTER — Other Ambulatory Visit: Payer: Self-pay

## 2020-06-01 DIAGNOSIS — L89613 Pressure ulcer of right heel, stage 3: Secondary | ICD-10-CM | POA: Diagnosis not present

## 2020-06-01 DIAGNOSIS — E119 Type 2 diabetes mellitus without complications: Secondary | ICD-10-CM

## 2020-06-01 DIAGNOSIS — L89623 Pressure ulcer of left heel, stage 3: Secondary | ICD-10-CM

## 2020-06-01 DIAGNOSIS — D689 Coagulation defect, unspecified: Secondary | ICD-10-CM

## 2020-06-02 DIAGNOSIS — I4891 Unspecified atrial fibrillation: Secondary | ICD-10-CM | POA: Diagnosis not present

## 2020-06-02 DIAGNOSIS — D631 Anemia in chronic kidney disease: Secondary | ICD-10-CM | POA: Diagnosis not present

## 2020-06-02 DIAGNOSIS — Z950 Presence of cardiac pacemaker: Secondary | ICD-10-CM | POA: Diagnosis not present

## 2020-06-02 DIAGNOSIS — Z7984 Long term (current) use of oral hypoglycemic drugs: Secondary | ICD-10-CM | POA: Diagnosis not present

## 2020-06-02 DIAGNOSIS — Z9181 History of falling: Secondary | ICD-10-CM | POA: Diagnosis not present

## 2020-06-02 DIAGNOSIS — L89610 Pressure ulcer of right heel, unstageable: Secondary | ICD-10-CM | POA: Diagnosis not present

## 2020-06-02 DIAGNOSIS — M84451D Pathological fracture, right femur, subsequent encounter for fracture with routine healing: Secondary | ICD-10-CM | POA: Diagnosis not present

## 2020-06-02 DIAGNOSIS — I130 Hypertensive heart and chronic kidney disease with heart failure and stage 1 through stage 4 chronic kidney disease, or unspecified chronic kidney disease: Secondary | ICD-10-CM | POA: Diagnosis not present

## 2020-06-02 DIAGNOSIS — I5032 Chronic diastolic (congestive) heart failure: Secondary | ICD-10-CM | POA: Diagnosis not present

## 2020-06-02 DIAGNOSIS — E1122 Type 2 diabetes mellitus with diabetic chronic kidney disease: Secondary | ICD-10-CM | POA: Diagnosis not present

## 2020-06-02 DIAGNOSIS — L89620 Pressure ulcer of left heel, unstageable: Secondary | ICD-10-CM | POA: Diagnosis not present

## 2020-06-02 DIAGNOSIS — J849 Interstitial pulmonary disease, unspecified: Secondary | ICD-10-CM | POA: Diagnosis not present

## 2020-06-02 DIAGNOSIS — E039 Hypothyroidism, unspecified: Secondary | ICD-10-CM | POA: Diagnosis not present

## 2020-06-02 DIAGNOSIS — Z7982 Long term (current) use of aspirin: Secondary | ICD-10-CM | POA: Diagnosis not present

## 2020-06-02 DIAGNOSIS — N189 Chronic kidney disease, unspecified: Secondary | ICD-10-CM | POA: Diagnosis not present

## 2020-06-05 NOTE — Progress Notes (Signed)
   HPI: 87 y.o. female presenting today for evaluation of blisters that have developed to the bilateral heels.  She was last seen in the office on 10/07/2019.  Patient states that she has developed symptomatic painful pressure sores to the heels posteriorly.  There is a dark discoloration to the skin with drainage to the left heel.  Patient states that she is diabetic.   Patient presents today nonambulatory in a wheelchair.  Past Medical History:  Diagnosis Date  . Anxiety   . Arthritis   . AV node arrhythmia    a. s/p AV node ablation/PPM 07/2017.  [name] Chronic diastolic CHF (congestive heart failure) ([place])    a. Acute exacerbation occurred in setting of AF.  [name] CKD (chronic kidney disease), stage III ([place])   . Diabetes mellitus without complication ([place])    type 2  . 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 H/O bladder infections   . Hyperlipidemia   . Hypertension   . Hypothyroidism   . Lower extremity edema   . Osteoporosis   . Persistent atrial fibrillation ([place])    a. diagnosed in 10/2014 by PCP, underwent failed TEE DCCV on 10/08/14, loaded with amio, successfully DCCV on 10/15/14.       Physical Exam: General: The patient is alert and oriented x3 in no acute distress.  Dermatology: Wound #1 noted to the right posterior heel measuring approximately 5.0x3.0 (LxW) unstageable depth.  There is a well adhered overlying eschar noted.  No malodor noted.  No drainage noted.  Wound #2 noted to the left posterior heel measuring approximately 2.0 x 2.0 x 0.3 cm.  To the noted ulceration there is a an extensive amount of slough fibrotic tissue to the wound base that appears well adhered and dry around the perimeter of the wound.  No granulation tissue really noted.  There is not appear to be any exposed bone muscle tendon ligament or joint.  Negative for any drainage today no malodor noted.  No maceration noted  Vascular: Palpable pedal pulses bilaterally. No edema or erythema noted.  Capillary refill within normal limits.  Neurological: Epicritic and protective threshold grossly intact bilaterally.   Musculoskeletal Exam: Patient is nonambulatory in a wheelchair.  No pedal deformities noted bilateral lower extremities  Assessment: 1.  Pressure ulcers posterior heels bilateral   Plan of Care:  1. Patient evaluated.   2.  At the moment we are going to recommend Betadine with dry sterile dressing daily.  Apparently since last visit the facility that she is staying it has not been applying Betadine to the wounds.  They have only been applying a skin protectant 3.  Continue offloading bunny boots 4.  Return to clinic in 3 weeks      Edrick Kins, DPM [facility]  Dr. Edrick Kins, DPM    [address], [place] [address]                Office [phone]  Fax [phone]

## 2020-06-06 DIAGNOSIS — E039 Hypothyroidism, unspecified: Secondary | ICD-10-CM | POA: Diagnosis not present

## 2020-06-06 DIAGNOSIS — D631 Anemia in chronic kidney disease: Secondary | ICD-10-CM | POA: Diagnosis not present

## 2020-06-06 DIAGNOSIS — I130 Hypertensive heart and chronic kidney disease with heart failure and stage 1 through stage 4 chronic kidney disease, or unspecified chronic kidney disease: Secondary | ICD-10-CM | POA: Diagnosis not present

## 2020-06-06 DIAGNOSIS — J849 Interstitial pulmonary disease, unspecified: Secondary | ICD-10-CM | POA: Diagnosis not present

## 2020-06-06 DIAGNOSIS — Z950 Presence of cardiac pacemaker: Secondary | ICD-10-CM | POA: Diagnosis not present

## 2020-06-06 DIAGNOSIS — Z9181 History of falling: Secondary | ICD-10-CM | POA: Diagnosis not present

## 2020-06-06 DIAGNOSIS — L89620 Pressure ulcer of left heel, unstageable: Secondary | ICD-10-CM | POA: Diagnosis not present

## 2020-06-06 DIAGNOSIS — M84451D Pathological fracture, right femur, subsequent encounter for fracture with routine healing: Secondary | ICD-10-CM | POA: Diagnosis not present

## 2020-06-06 DIAGNOSIS — I4891 Unspecified atrial fibrillation: Secondary | ICD-10-CM | POA: Diagnosis not present

## 2020-06-06 DIAGNOSIS — Z7982 Long term (current) use of aspirin: Secondary | ICD-10-CM | POA: Diagnosis not present

## 2020-06-06 DIAGNOSIS — L89610 Pressure ulcer of right heel, unstageable: Secondary | ICD-10-CM | POA: Diagnosis not present

## 2020-06-06 DIAGNOSIS — I5032 Chronic diastolic (congestive) heart failure: Secondary | ICD-10-CM | POA: Diagnosis not present

## 2020-06-06 DIAGNOSIS — Z7984 Long term (current) use of oral hypoglycemic drugs: Secondary | ICD-10-CM | POA: Diagnosis not present

## 2020-06-06 DIAGNOSIS — E1122 Type 2 diabetes mellitus with diabetic chronic kidney disease: Secondary | ICD-10-CM | POA: Diagnosis not present

## 2020-06-06 DIAGNOSIS — N189 Chronic kidney disease, unspecified: Secondary | ICD-10-CM | POA: Diagnosis not present

## 2020-06-09 DIAGNOSIS — L89610 Pressure ulcer of right heel, unstageable: Secondary | ICD-10-CM | POA: Diagnosis not present

## 2020-06-09 DIAGNOSIS — I130 Hypertensive heart and chronic kidney disease with heart failure and stage 1 through stage 4 chronic kidney disease, or unspecified chronic kidney disease: Secondary | ICD-10-CM | POA: Diagnosis not present

## 2020-06-09 DIAGNOSIS — Z9181 History of falling: Secondary | ICD-10-CM | POA: Diagnosis not present

## 2020-06-09 DIAGNOSIS — Z7982 Long term (current) use of aspirin: Secondary | ICD-10-CM | POA: Diagnosis not present

## 2020-06-09 DIAGNOSIS — E039 Hypothyroidism, unspecified: Secondary | ICD-10-CM | POA: Diagnosis not present

## 2020-06-09 DIAGNOSIS — D631 Anemia in chronic kidney disease: Secondary | ICD-10-CM | POA: Diagnosis not present

## 2020-06-09 DIAGNOSIS — Z7984 Long term (current) use of oral hypoglycemic drugs: Secondary | ICD-10-CM | POA: Diagnosis not present

## 2020-06-09 DIAGNOSIS — N189 Chronic kidney disease, unspecified: Secondary | ICD-10-CM | POA: Diagnosis not present

## 2020-06-09 DIAGNOSIS — I5032 Chronic diastolic (congestive) heart failure: Secondary | ICD-10-CM | POA: Diagnosis not present

## 2020-06-09 DIAGNOSIS — J849 Interstitial pulmonary disease, unspecified: Secondary | ICD-10-CM | POA: Diagnosis not present

## 2020-06-09 DIAGNOSIS — Z950 Presence of cardiac pacemaker: Secondary | ICD-10-CM | POA: Diagnosis not present

## 2020-06-09 DIAGNOSIS — L89620 Pressure ulcer of left heel, unstageable: Secondary | ICD-10-CM | POA: Diagnosis not present

## 2020-06-09 DIAGNOSIS — I4891 Unspecified atrial fibrillation: Secondary | ICD-10-CM | POA: Diagnosis not present

## 2020-06-09 DIAGNOSIS — M84451D Pathological fracture, right femur, subsequent encounter for fracture with routine healing: Secondary | ICD-10-CM | POA: Diagnosis not present

## 2020-06-09 DIAGNOSIS — E1122 Type 2 diabetes mellitus with diabetic chronic kidney disease: Secondary | ICD-10-CM | POA: Diagnosis not present

## 2020-06-09 NOTE — Progress Notes (Signed)
Remote pacemaker transmission.   

## 2020-06-13 DIAGNOSIS — E039 Hypothyroidism, unspecified: Secondary | ICD-10-CM | POA: Diagnosis not present

## 2020-06-13 DIAGNOSIS — I5032 Chronic diastolic (congestive) heart failure: Secondary | ICD-10-CM | POA: Diagnosis not present

## 2020-06-13 DIAGNOSIS — E1122 Type 2 diabetes mellitus with diabetic chronic kidney disease: Secondary | ICD-10-CM | POA: Diagnosis not present

## 2020-06-13 DIAGNOSIS — M84451D Pathological fracture, right femur, subsequent encounter for fracture with routine healing: Secondary | ICD-10-CM | POA: Diagnosis not present

## 2020-06-13 DIAGNOSIS — I130 Hypertensive heart and chronic kidney disease with heart failure and stage 1 through stage 4 chronic kidney disease, or unspecified chronic kidney disease: Secondary | ICD-10-CM | POA: Diagnosis not present

## 2020-06-13 DIAGNOSIS — Z7982 Long term (current) use of aspirin: Secondary | ICD-10-CM | POA: Diagnosis not present

## 2020-06-13 DIAGNOSIS — N189 Chronic kidney disease, unspecified: Secondary | ICD-10-CM | POA: Diagnosis not present

## 2020-06-13 DIAGNOSIS — L89620 Pressure ulcer of left heel, unstageable: Secondary | ICD-10-CM | POA: Diagnosis not present

## 2020-06-13 DIAGNOSIS — Z7984 Long term (current) use of oral hypoglycemic drugs: Secondary | ICD-10-CM | POA: Diagnosis not present

## 2020-06-13 DIAGNOSIS — D631 Anemia in chronic kidney disease: Secondary | ICD-10-CM | POA: Diagnosis not present

## 2020-06-13 DIAGNOSIS — Z950 Presence of cardiac pacemaker: Secondary | ICD-10-CM | POA: Diagnosis not present

## 2020-06-13 DIAGNOSIS — I4891 Unspecified atrial fibrillation: Secondary | ICD-10-CM | POA: Diagnosis not present

## 2020-06-13 DIAGNOSIS — L89610 Pressure ulcer of right heel, unstageable: Secondary | ICD-10-CM | POA: Diagnosis not present

## 2020-06-13 DIAGNOSIS — J849 Interstitial pulmonary disease, unspecified: Secondary | ICD-10-CM | POA: Diagnosis not present

## 2020-06-13 DIAGNOSIS — Z9181 History of falling: Secondary | ICD-10-CM | POA: Diagnosis not present

## 2020-06-14 DIAGNOSIS — L89610 Pressure ulcer of right heel, unstageable: Secondary | ICD-10-CM | POA: Diagnosis not present

## 2020-06-14 DIAGNOSIS — L89620 Pressure ulcer of left heel, unstageable: Secondary | ICD-10-CM | POA: Diagnosis not present

## 2020-06-14 DIAGNOSIS — I130 Hypertensive heart and chronic kidney disease with heart failure and stage 1 through stage 4 chronic kidney disease, or unspecified chronic kidney disease: Secondary | ICD-10-CM | POA: Diagnosis not present

## 2020-06-14 DIAGNOSIS — Z9181 History of falling: Secondary | ICD-10-CM | POA: Diagnosis not present

## 2020-06-14 DIAGNOSIS — E039 Hypothyroidism, unspecified: Secondary | ICD-10-CM | POA: Diagnosis not present

## 2020-06-14 DIAGNOSIS — Z950 Presence of cardiac pacemaker: Secondary | ICD-10-CM | POA: Diagnosis not present

## 2020-06-14 DIAGNOSIS — Z7982 Long term (current) use of aspirin: Secondary | ICD-10-CM | POA: Diagnosis not present

## 2020-06-14 DIAGNOSIS — N189 Chronic kidney disease, unspecified: Secondary | ICD-10-CM | POA: Diagnosis not present

## 2020-06-14 DIAGNOSIS — I4891 Unspecified atrial fibrillation: Secondary | ICD-10-CM | POA: Diagnosis not present

## 2020-06-14 DIAGNOSIS — J849 Interstitial pulmonary disease, unspecified: Secondary | ICD-10-CM | POA: Diagnosis not present

## 2020-06-14 DIAGNOSIS — M84451D Pathological fracture, right femur, subsequent encounter for fracture with routine healing: Secondary | ICD-10-CM | POA: Diagnosis not present

## 2020-06-14 DIAGNOSIS — Z7984 Long term (current) use of oral hypoglycemic drugs: Secondary | ICD-10-CM | POA: Diagnosis not present

## 2020-06-14 DIAGNOSIS — D631 Anemia in chronic kidney disease: Secondary | ICD-10-CM | POA: Diagnosis not present

## 2020-06-14 DIAGNOSIS — E1122 Type 2 diabetes mellitus with diabetic chronic kidney disease: Secondary | ICD-10-CM | POA: Diagnosis not present

## 2020-06-14 DIAGNOSIS — I5032 Chronic diastolic (congestive) heart failure: Secondary | ICD-10-CM | POA: Diagnosis not present

## 2020-06-16 DIAGNOSIS — D631 Anemia in chronic kidney disease: Secondary | ICD-10-CM | POA: Diagnosis not present

## 2020-06-16 DIAGNOSIS — E039 Hypothyroidism, unspecified: Secondary | ICD-10-CM | POA: Diagnosis not present

## 2020-06-16 DIAGNOSIS — Z7982 Long term (current) use of aspirin: Secondary | ICD-10-CM | POA: Diagnosis not present

## 2020-06-16 DIAGNOSIS — Z7984 Long term (current) use of oral hypoglycemic drugs: Secondary | ICD-10-CM | POA: Diagnosis not present

## 2020-06-16 DIAGNOSIS — J849 Interstitial pulmonary disease, unspecified: Secondary | ICD-10-CM | POA: Diagnosis not present

## 2020-06-16 DIAGNOSIS — L89620 Pressure ulcer of left heel, unstageable: Secondary | ICD-10-CM | POA: Diagnosis not present

## 2020-06-16 DIAGNOSIS — I5032 Chronic diastolic (congestive) heart failure: Secondary | ICD-10-CM | POA: Diagnosis not present

## 2020-06-16 DIAGNOSIS — L89610 Pressure ulcer of right heel, unstageable: Secondary | ICD-10-CM | POA: Diagnosis not present

## 2020-06-16 DIAGNOSIS — Z950 Presence of cardiac pacemaker: Secondary | ICD-10-CM | POA: Diagnosis not present

## 2020-06-16 DIAGNOSIS — E1122 Type 2 diabetes mellitus with diabetic chronic kidney disease: Secondary | ICD-10-CM | POA: Diagnosis not present

## 2020-06-16 DIAGNOSIS — I4891 Unspecified atrial fibrillation: Secondary | ICD-10-CM | POA: Diagnosis not present

## 2020-06-16 DIAGNOSIS — M84451D Pathological fracture, right femur, subsequent encounter for fracture with routine healing: Secondary | ICD-10-CM | POA: Diagnosis not present

## 2020-06-16 DIAGNOSIS — I130 Hypertensive heart and chronic kidney disease with heart failure and stage 1 through stage 4 chronic kidney disease, or unspecified chronic kidney disease: Secondary | ICD-10-CM | POA: Diagnosis not present

## 2020-06-16 DIAGNOSIS — Z9181 History of falling: Secondary | ICD-10-CM | POA: Diagnosis not present

## 2020-06-16 DIAGNOSIS — N189 Chronic kidney disease, unspecified: Secondary | ICD-10-CM | POA: Diagnosis not present

## 2020-06-20 DIAGNOSIS — N189 Chronic kidney disease, unspecified: Secondary | ICD-10-CM | POA: Diagnosis not present

## 2020-06-20 DIAGNOSIS — E039 Hypothyroidism, unspecified: Secondary | ICD-10-CM | POA: Diagnosis not present

## 2020-06-20 DIAGNOSIS — L89610 Pressure ulcer of right heel, unstageable: Secondary | ICD-10-CM | POA: Diagnosis not present

## 2020-06-20 DIAGNOSIS — M84451D Pathological fracture, right femur, subsequent encounter for fracture with routine healing: Secondary | ICD-10-CM | POA: Diagnosis not present

## 2020-06-20 DIAGNOSIS — Z9181 History of falling: Secondary | ICD-10-CM | POA: Diagnosis not present

## 2020-06-20 DIAGNOSIS — J849 Interstitial pulmonary disease, unspecified: Secondary | ICD-10-CM | POA: Diagnosis not present

## 2020-06-20 DIAGNOSIS — D631 Anemia in chronic kidney disease: Secondary | ICD-10-CM | POA: Diagnosis not present

## 2020-06-20 DIAGNOSIS — E1122 Type 2 diabetes mellitus with diabetic chronic kidney disease: Secondary | ICD-10-CM | POA: Diagnosis not present

## 2020-06-20 DIAGNOSIS — Z7982 Long term (current) use of aspirin: Secondary | ICD-10-CM | POA: Diagnosis not present

## 2020-06-20 DIAGNOSIS — I130 Hypertensive heart and chronic kidney disease with heart failure and stage 1 through stage 4 chronic kidney disease, or unspecified chronic kidney disease: Secondary | ICD-10-CM | POA: Diagnosis not present

## 2020-06-20 DIAGNOSIS — Z7984 Long term (current) use of oral hypoglycemic drugs: Secondary | ICD-10-CM | POA: Diagnosis not present

## 2020-06-20 DIAGNOSIS — I4891 Unspecified atrial fibrillation: Secondary | ICD-10-CM | POA: Diagnosis not present

## 2020-06-20 DIAGNOSIS — L89620 Pressure ulcer of left heel, unstageable: Secondary | ICD-10-CM | POA: Diagnosis not present

## 2020-06-20 DIAGNOSIS — Z950 Presence of cardiac pacemaker: Secondary | ICD-10-CM | POA: Diagnosis not present

## 2020-06-20 DIAGNOSIS — I5032 Chronic diastolic (congestive) heart failure: Secondary | ICD-10-CM | POA: Diagnosis not present

## 2020-06-22 ENCOUNTER — Ambulatory Visit: Payer: Medicare Other | Admitting: Podiatry

## 2020-06-23 DIAGNOSIS — Z950 Presence of cardiac pacemaker: Secondary | ICD-10-CM | POA: Diagnosis not present

## 2020-06-23 DIAGNOSIS — N189 Chronic kidney disease, unspecified: Secondary | ICD-10-CM | POA: Diagnosis not present

## 2020-06-23 DIAGNOSIS — Z7982 Long term (current) use of aspirin: Secondary | ICD-10-CM | POA: Diagnosis not present

## 2020-06-23 DIAGNOSIS — L89610 Pressure ulcer of right heel, unstageable: Secondary | ICD-10-CM | POA: Diagnosis not present

## 2020-06-23 DIAGNOSIS — Z7984 Long term (current) use of oral hypoglycemic drugs: Secondary | ICD-10-CM | POA: Diagnosis not present

## 2020-06-23 DIAGNOSIS — E039 Hypothyroidism, unspecified: Secondary | ICD-10-CM | POA: Diagnosis not present

## 2020-06-23 DIAGNOSIS — E1122 Type 2 diabetes mellitus with diabetic chronic kidney disease: Secondary | ICD-10-CM | POA: Diagnosis not present

## 2020-06-23 DIAGNOSIS — I130 Hypertensive heart and chronic kidney disease with heart failure and stage 1 through stage 4 chronic kidney disease, or unspecified chronic kidney disease: Secondary | ICD-10-CM | POA: Diagnosis not present

## 2020-06-23 DIAGNOSIS — L89620 Pressure ulcer of left heel, unstageable: Secondary | ICD-10-CM | POA: Diagnosis not present

## 2020-06-23 DIAGNOSIS — M84451D Pathological fracture, right femur, subsequent encounter for fracture with routine healing: Secondary | ICD-10-CM | POA: Diagnosis not present

## 2020-06-23 DIAGNOSIS — I4891 Unspecified atrial fibrillation: Secondary | ICD-10-CM | POA: Diagnosis not present

## 2020-06-23 DIAGNOSIS — J849 Interstitial pulmonary disease, unspecified: Secondary | ICD-10-CM | POA: Diagnosis not present

## 2020-06-23 DIAGNOSIS — D631 Anemia in chronic kidney disease: Secondary | ICD-10-CM | POA: Diagnosis not present

## 2020-06-23 DIAGNOSIS — I5032 Chronic diastolic (congestive) heart failure: Secondary | ICD-10-CM | POA: Diagnosis not present

## 2020-06-23 DIAGNOSIS — Z9181 History of falling: Secondary | ICD-10-CM | POA: Diagnosis not present

## 2020-06-27 ENCOUNTER — Ambulatory Visit (INDEPENDENT_AMBULATORY_CARE_PROVIDER_SITE_OTHER): Payer: Medicare Other | Admitting: Podiatry

## 2020-06-27 ENCOUNTER — Other Ambulatory Visit: Payer: Self-pay

## 2020-06-27 DIAGNOSIS — J849 Interstitial pulmonary disease, unspecified: Secondary | ICD-10-CM | POA: Diagnosis not present

## 2020-06-27 DIAGNOSIS — E1122 Type 2 diabetes mellitus with diabetic chronic kidney disease: Secondary | ICD-10-CM | POA: Diagnosis not present

## 2020-06-27 DIAGNOSIS — Z950 Presence of cardiac pacemaker: Secondary | ICD-10-CM | POA: Diagnosis not present

## 2020-06-27 DIAGNOSIS — M84451D Pathological fracture, right femur, subsequent encounter for fracture with routine healing: Secondary | ICD-10-CM | POA: Diagnosis not present

## 2020-06-27 DIAGNOSIS — I4891 Unspecified atrial fibrillation: Secondary | ICD-10-CM | POA: Diagnosis not present

## 2020-06-27 DIAGNOSIS — E119 Type 2 diabetes mellitus without complications: Secondary | ICD-10-CM

## 2020-06-27 DIAGNOSIS — D689 Coagulation defect, unspecified: Secondary | ICD-10-CM

## 2020-06-27 DIAGNOSIS — Z7982 Long term (current) use of aspirin: Secondary | ICD-10-CM | POA: Diagnosis not present

## 2020-06-27 DIAGNOSIS — E039 Hypothyroidism, unspecified: Secondary | ICD-10-CM | POA: Diagnosis not present

## 2020-06-27 DIAGNOSIS — L89613 Pressure ulcer of right heel, stage 3: Secondary | ICD-10-CM | POA: Diagnosis not present

## 2020-06-27 DIAGNOSIS — L89623 Pressure ulcer of left heel, stage 3: Secondary | ICD-10-CM

## 2020-06-27 DIAGNOSIS — I130 Hypertensive heart and chronic kidney disease with heart failure and stage 1 through stage 4 chronic kidney disease, or unspecified chronic kidney disease: Secondary | ICD-10-CM | POA: Diagnosis not present

## 2020-06-27 DIAGNOSIS — N189 Chronic kidney disease, unspecified: Secondary | ICD-10-CM | POA: Diagnosis not present

## 2020-06-27 DIAGNOSIS — Z9181 History of falling: Secondary | ICD-10-CM | POA: Diagnosis not present

## 2020-06-27 DIAGNOSIS — L89610 Pressure ulcer of right heel, unstageable: Secondary | ICD-10-CM | POA: Diagnosis not present

## 2020-06-27 DIAGNOSIS — Z7984 Long term (current) use of oral hypoglycemic drugs: Secondary | ICD-10-CM | POA: Diagnosis not present

## 2020-06-27 DIAGNOSIS — D631 Anemia in chronic kidney disease: Secondary | ICD-10-CM | POA: Diagnosis not present

## 2020-06-27 DIAGNOSIS — I5032 Chronic diastolic (congestive) heart failure: Secondary | ICD-10-CM | POA: Diagnosis not present

## 2020-06-27 DIAGNOSIS — L89620 Pressure ulcer of left heel, unstageable: Secondary | ICD-10-CM | POA: Diagnosis not present

## 2020-06-27 NOTE — Progress Notes (Signed)
   HPI: 87 y.o. female presenting today for evaluation of pressure ulcers to the bilateral heels.  Patient is nonambulatory in a wheelchair.  The nursing facility that she resides in has been applying Betadine 2 times daily.  No new complaints at this time  Past Medical History:  Diagnosis Date  . Anxiety   . Arthritis   . AV node arrhythmia    a. s/p AV node ablation/PPM 07/2017.  [name] Chronic diastolic CHF (congestive heart failure) ([place])    a. Acute exacerbation occurred in setting of AF.  [name] CKD (chronic kidney disease), stage III ([place])   . Diabetes mellitus without complication ([place])    type 2  . 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 H/O bladder infections   . Hyperlipidemia   . Hypertension   . Hypothyroidism   . Lower extremity edema   . Osteoporosis   . Persistent atrial fibrillation ([place])    a. diagnosed in 10/2014 by PCP, underwent failed TEE DCCV on 10/08/14, loaded with amio, successfully DCCV on 10/15/14.       Physical Exam: General: The patient is alert and oriented x3 in no acute distress.  Dermatology: wound #1 noted to the right posterior heel measuring approximately 4.5x3.0 (LxW) unstageable depth.  There is a well adhered overlying eschar noted.  No malodor noted.  No drainage noted.  Wound #2 noted to the left posterior heel measuring approximately 2.0 x 2.0 x 0.3 cm.  To the noted ulceration there is a an extensive amount of slough fibrotic tissue to the wound base that appears well adhered and dry around the perimeter of the wound.  No granulation tissue really noted.  There is not appear to be any exposed bone muscle tendon ligament or joint.  Negative for any drainage today no malodor noted.  No maceration noted  Vascular: Palpable pedal pulses bilaterally. No edema or erythema noted. Capillary refill within normal limits.  Neurological: Epicritic and protective threshold grossly intact bilaterally.   Musculoskeletal Exam: Patient is nonambulatory in a  wheelchair.  No pedal deformities noted bilateral lower extremities  Assessment: 1.  Pressure ulcers posterior heels bilateral   Plan of Care:  1. Patient evaluated.  Overall the wounds appear to be improving significantly. 2.  Medically necessary excisional debridement including subcutaneous tissue was performed using a tissue nipper with post debridement measurement same as pre-.  Excisional debridement of all the necrotic nonviable tissue down to healthy bleeding viable tissue was performed. 3.  Continue Betadine application 2 times per week to the bilateral heels with an Ace wrap 4.  Return to clinic 1 month     Edrick Kins, DPM [facility]  Dr. Edrick Kins, DPM    [address], [place] [address]                Office [phone]  Fax [phone]

## 2020-08-14 IMAGING — CT CT L SPINE W/O CM
3 of 4 series · 10 of 33 positions shown, 12 images · non-contrast
Comparison: MRI 12/09/2013

CLINICAL DATA: Low back pain

EXAM:
CT LUMBAR SPINE WITHOUT CONTRAST
TECHNIQUE: Multidetector CT imaging of the lumbar spine was performed without
intravenous contrast administration. Multiplanar CT image
reconstructions were also generated.

[Series 4: l spine soft · axial · 0.29mm/px · z∈[-205,-119]mm · 2 of 129 slices shown, 3 images]
[im 43/129  soft-tissue]
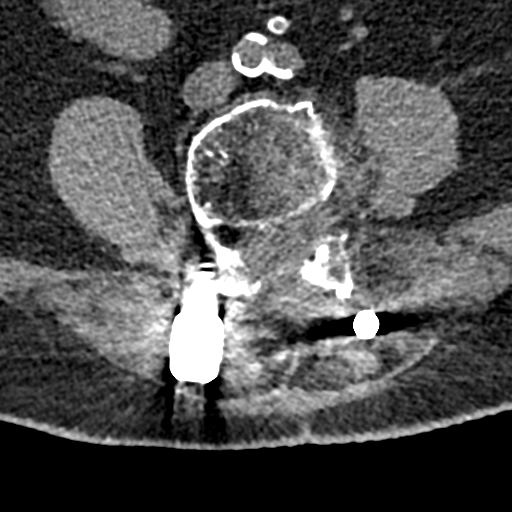
[im 43/129  bone]
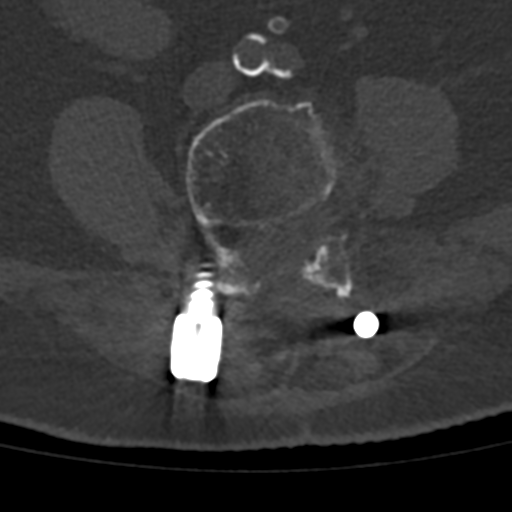
[im 86/129  bone]
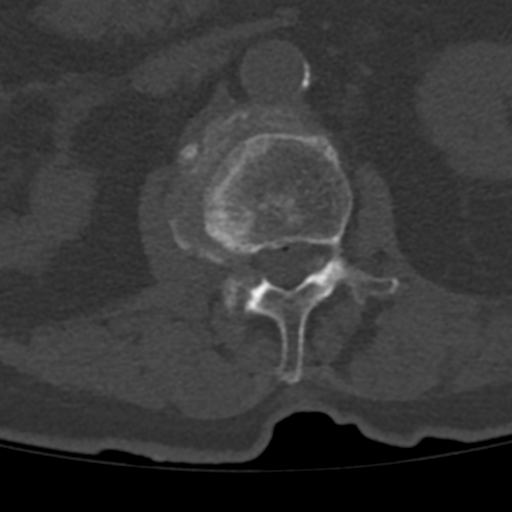

[Series 6: coronal bone · coronal · 0.32mm/px · 3 of 61 slices shown]
[im 13/61  bone]
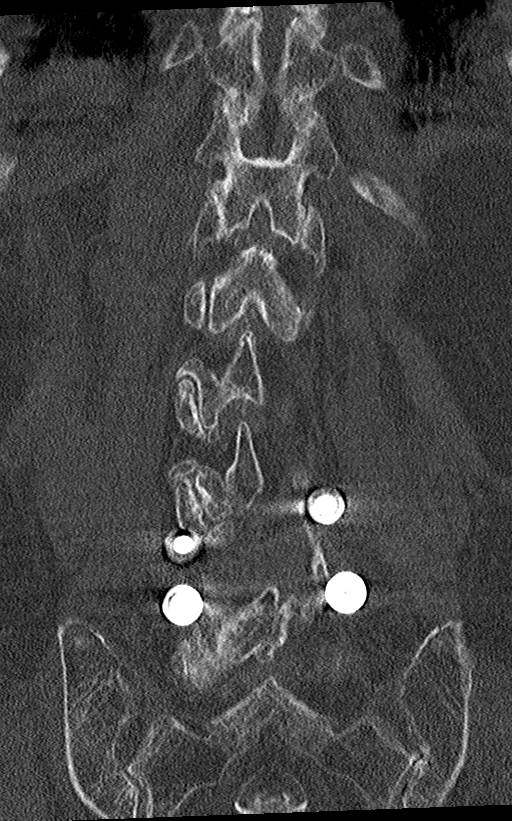
[im 25/61  bone]
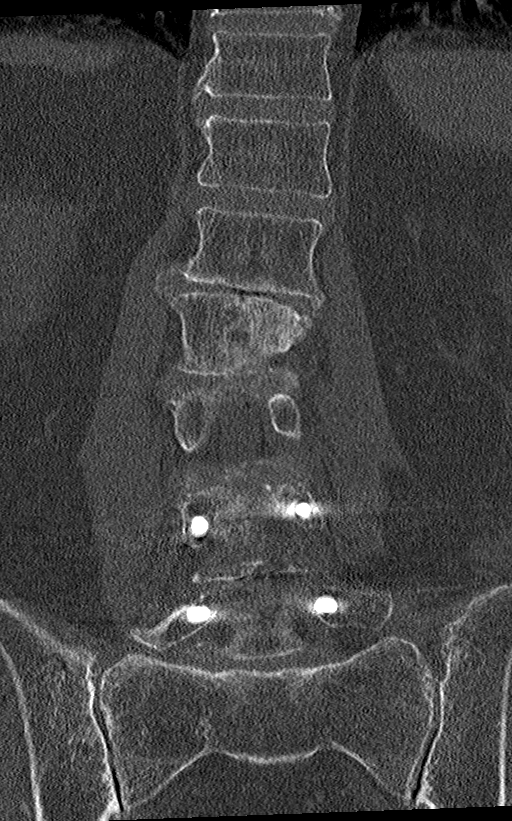
[im 37/61  bone]
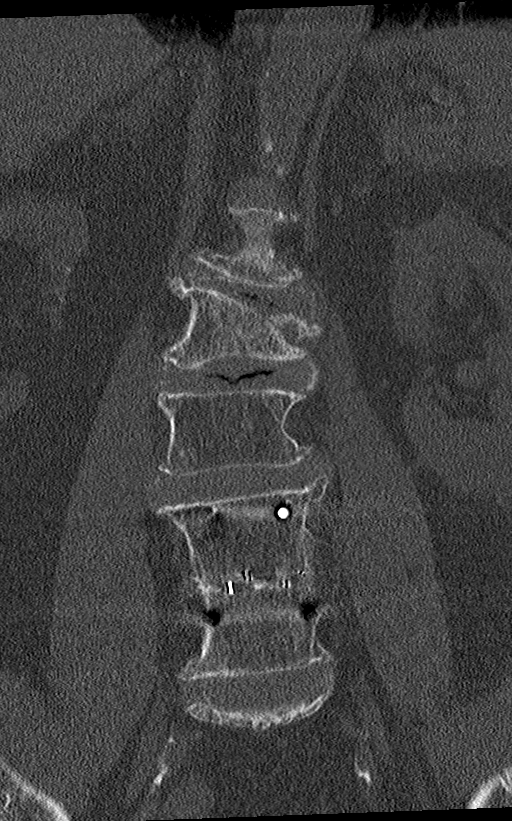

[Series 7: sagittal bone · sagittal · 0.29mm/px · 5 of 73 slices shown, 6 images]
[im 25/73  bone]
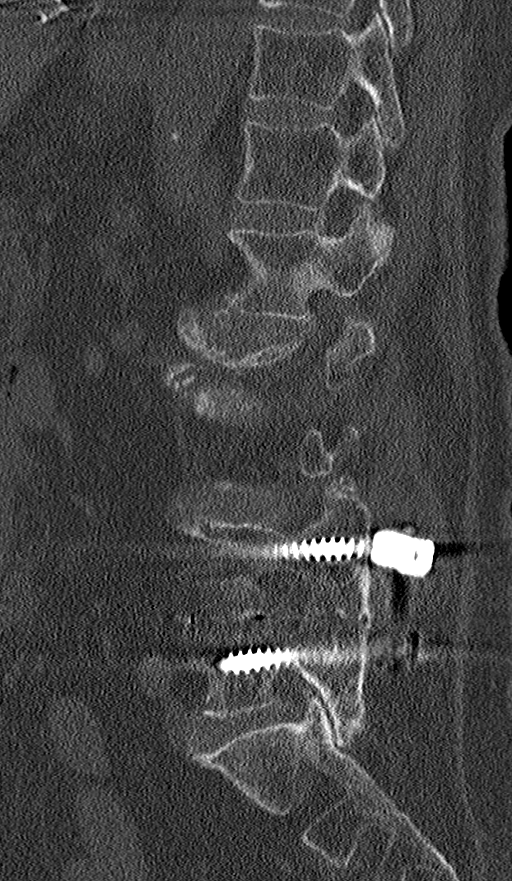
[im 31/73  bone]
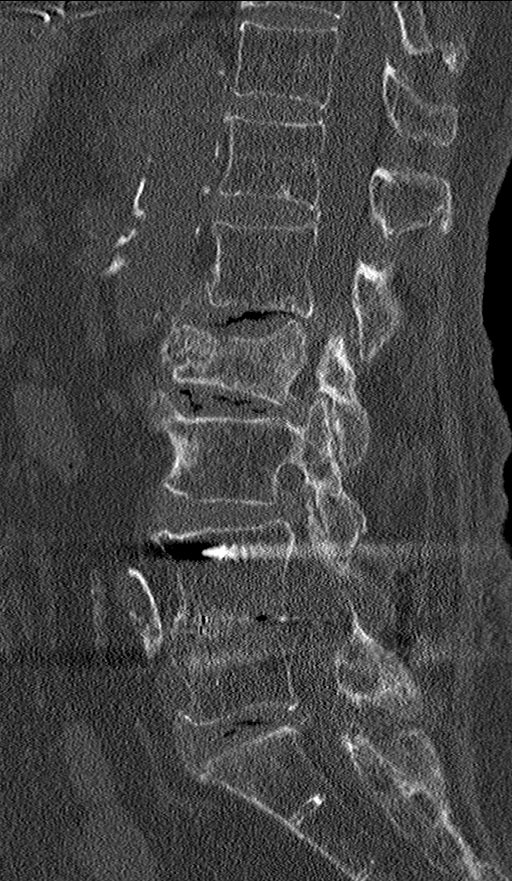
[im 37/73  soft-tissue]
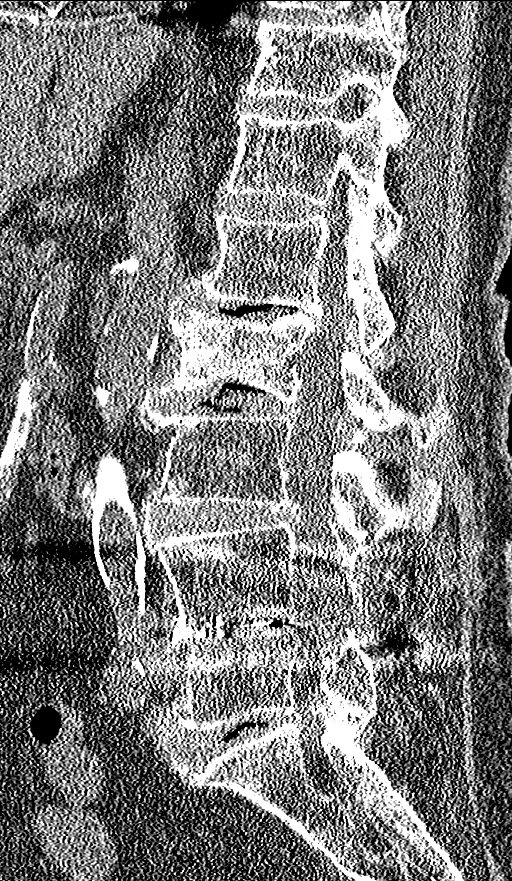
[im 37/73  bone]
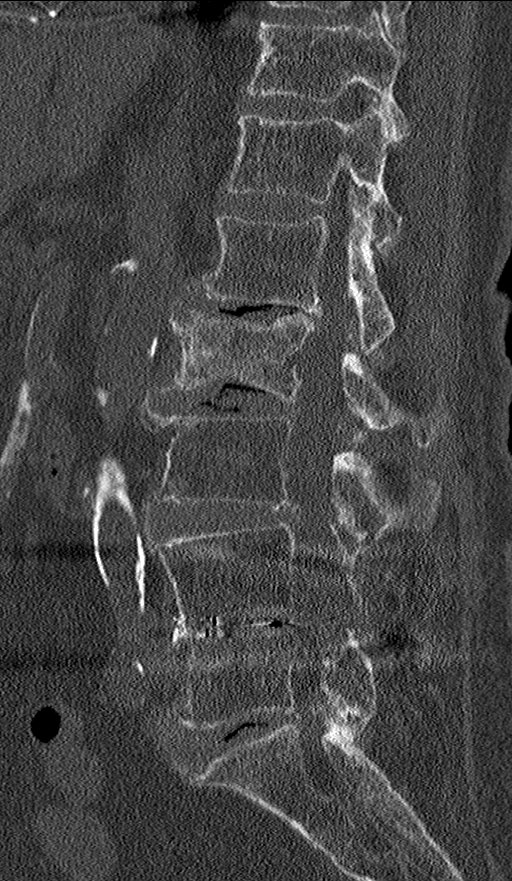
[im 43/73  bone]
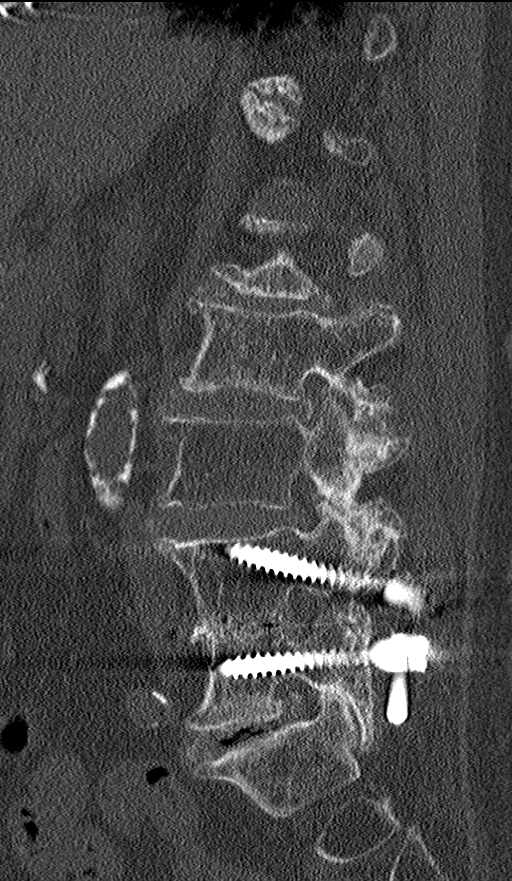
[im 49/73  bone]
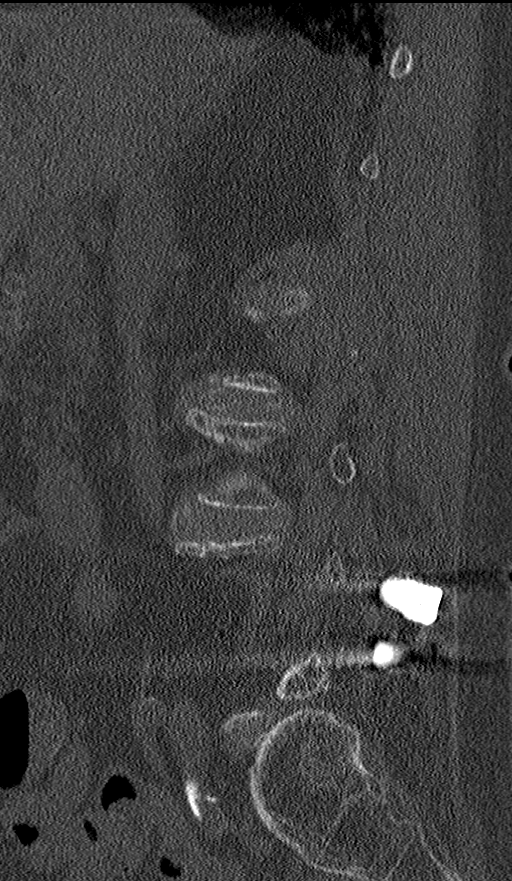

[10 of 33 positions shown; findings below may reference images not displayed]

FINDINGS: Segmentation: 5 lumbar type vertebral bodies.

Alignment: Mild curvature convex to the right.

Vertebrae: Compression fracture at L2, more pronounced on the left
than the right, with fragment of the anterior superior corner
displaced a few mm from the main vertebral body. Loss of height on
the left side is 40%. The bone shows some sclerotic change,
suggesting that it is probably subacute and in the healing process.
No retropulsed bone resulting in neural compression.

Paraspinal and other soft tissues: Aortic atherosclerosis.

Disc levels: T11-12: Normal

T12-L1: Annular calcification. Mild facet degeneration. No
compressive stenosis.

L1-2: Endplate osteophytes and bulging of the disc. Moderate
stenosis at this level could possibly cause neural compression.

L2-3: Endplate osteophytes and bulging of the disc. Mild facet
hypertrophy. Moderate stenosis at this level could possibly cause
neural compression.

L3-4: Circumferential bulging of the disc. Facet and ligamentous
hypertrophy. Stenosis of the lateral recesses could possibly cause
neural compression. There is left foraminal to extraforaminal
stenosis due to asymmetric protrusion of disc material that could
focally affect the left L3 nerve. This appears to be chronic.

L4-5: Previous PLIF. Solid union with sufficient patency of the
canal and foramina. No hardware complication.

L5-S1: Disc degeneration with bulging of the disc. Previous left
hemilaminectomy. There is foraminal narrowing right worse than left
that could affect either L5 nerve, particularly the right.
IMPRESSION: Compression fracture at L2, worse on the left than the right, with
loss of height of 40% on the left side of the vertebral body. This
may be subacute/healing, based on relative sclerosis.

Moderate multifactorial stenosis at L1-2 and L2-3, similar to the
MRI study of 9696.

Bilateral lateral recess stenosis at L3-4. Left foraminal to
extraforaminal encroachment by asymmetric disc material, all similar
to the study of 9696.

Foraminal narrowing at L5-S1 worse on the right than the left, with
particular potential to affect the right L5 nerve. Similar to the
study of 9696.

## 2020-08-15 ENCOUNTER — Other Ambulatory Visit: Payer: Self-pay

## 2020-08-15 ENCOUNTER — Other Ambulatory Visit: Payer: Self-pay | Admitting: Podiatry

## 2020-08-15 ENCOUNTER — Telehealth: Payer: Self-pay | Admitting: *Deleted

## 2020-08-15 ENCOUNTER — Ambulatory Visit (INDEPENDENT_AMBULATORY_CARE_PROVIDER_SITE_OTHER): Payer: Medicare (Managed Care) | Admitting: Podiatry

## 2020-08-15 DIAGNOSIS — L97402 Non-pressure chronic ulcer of unspecified heel and midfoot with fat layer exposed: Secondary | ICD-10-CM

## 2020-08-15 DIAGNOSIS — L89623 Pressure ulcer of left heel, stage 3: Secondary | ICD-10-CM

## 2020-08-15 DIAGNOSIS — L89613 Pressure ulcer of right heel, stage 3: Secondary | ICD-10-CM | POA: Diagnosis not present

## 2020-08-15 DIAGNOSIS — E119 Type 2 diabetes mellitus without complications: Secondary | ICD-10-CM | POA: Diagnosis not present

## 2020-08-15 MED ORDER — DOXYCYCLINE HYCLATE 100 MG PO TABS: 100.0000 mg | ORAL_TABLET | Freq: Two times a day (BID) | ORAL | 0 refills | Status: DC

## 2020-08-15 NOTE — Addendum Note (Signed)
Addended by: Lind Guest on: 08/15/2020 04:19 PM   Modules accepted: Orders

## 2020-08-15 NOTE — Progress Notes (Signed)
HPI: 87 y.o. female presenting today for evaluation of pressure ulcers to the bilateral heels.  Patient is nonambulatory in a wheelchair.  The nursing facility that she resides in has been applying Betadine 2 times daily.  No new complaints at this time  Past Medical History:  Diagnosis Date  . Anxiety   . Arthritis   . AV node arrhythmia    a. s/p AV node ablation/PPM 07/2017.  [name] Chronic diastolic CHF (congestive heart failure) ([place])    a. Acute exacerbation occurred in setting of AF.  [name] CKD (chronic kidney disease), stage III ([place])   . Diabetes mellitus without complication ([place])    type 2  . 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 H/O bladder infections   . Hyperlipidemia   . Hypertension   . Hypothyroidism   . Lower extremity edema   . Osteoporosis   . Persistent atrial fibrillation ([place])    a. diagnosed in 10/2014 by PCP, underwent failed TEE DCCV on 10/08/14, loaded with amio, successfully DCCV on 10/15/14.       Physical Exam: General: The patient is alert and oriented x3 in no acute distress.  Dermatology: wound #1 noted to the right posterior heel measuring approximately 4.5x3.0 (LxW) unstageable depth.  There is a well adhered overlying eschar noted which is macerated.  There is wet eschar overlying the wound.  Mild malodor noted.  Serous drainage noted.  Wound #2 noted to the left posterior heel measuring approximately 2.0 x 2.0 x 0.3 cm.  To the noted ulceration there is a an extensive amount of slough fibrotic tissue to the wound base that appears well adhered and dry around the perimeter of the wound.  There is not appear to be any exposed bone muscle tendon ligament or joint.  Negative for any drainage today no malodor noted.  No maceration noted  Vascular: Palpable pedal pulses bilaterally. No edema or erythema noted. Capillary refill within normal limits.  Neurological: Epicritic and protective threshold grossly intact bilaterally.   Musculoskeletal Exam:  Patient is nonambulatory in a wheelchair.  No pedal deformities noted bilateral lower extremities  Assessment: 1.  Pressure ulcers posterior heels bilateral   Plan of Care:  1. Patient evaluated.  At this point with the deterioration of the wounds I think it would be best to take the patient to the OR for debridement and application of wound graft.  The patient is very sensate and wound debridement would be best served under anesthesia  2.  Medically necessary excisional debridement including subcutaneous tissue was performed using a tissue nipper with post debridement measurement same as pre-.  Excisional debridement of all the necrotic nonviable tissue down to healthy bleeding viable tissue was performed. 3.  Continue Betadine application 2 times per week to the bilateral heels with an Ace wrap 4.  Cultures taken today and sent to pathology for culture and sensitivity due to the deterioration of the wound and slight malodor 5.  Prescription for doxycycline 100 mg 2 times daily #20 6. Today we discussed the conservative versus surgical management of the presenting pathology. The patient opts for surgical management. All possible complications and details of the procedure were explained. All patient questions were answered. No guarantees were expressed or implied. 7. Authorization for surgery was initiated today. Surgery will consist of excisional debridement of ulcers bilateral heels.  Application of wound graft bilateral heels 8.  Return to clinic 1 week postop     Edrick Kins, DPM [facility]  Dr. Edrick Kins, DPM    [address], [place] [address]                Office [phone]  Fax [phone]

## 2020-08-15 NOTE — Telephone Encounter (Addendum)
Patient's daughter is wanting to let doctor know that the antibiotic for patient is to be sent to [facility]. Returned call to daughter and left vmessage to call back with the number and address of [facility], not listed in our database.

## 2020-08-17 ENCOUNTER — Telehealth: Payer: Self-pay | Admitting: Urology

## 2020-08-17 NOTE — Telephone Encounter (Signed)
DOS - 08/25/20  DEBRIDEMENT B/L HEELS --- [id number] APPLICATION OF GRAFT --- [id number]   [facility] EFFECTIVE DATE -07/01/20  SPOKE WITH HEATHER WITH [facility] AND GAVE ME THE AUTH #[id number].

## 2020-08-17 NOTE — Telephone Encounter (Signed)
Nurse at [facility] calling to inquire which antibiotic the patient needs. Is requesting a call back with this information. Please advise.

## 2020-08-18 ENCOUNTER — Telehealth: Payer: Self-pay | Admitting: *Deleted

## 2020-08-18 LAB — WOUND CULTURE
MICRO NUMBER:: 11894684
SPECIMEN QUALITY:: ADEQUATE

## 2020-08-18 LAB — HOUSE ACCOUNT TRACKING

## 2020-08-18 NOTE — Telephone Encounter (Signed)
Patient's medical advocate ([name])is calling with concerns about the upcoming surgery next Thursday for the removal of a bed sore/skin graft.  She said that a nurse only comes once weekly for bandage changes and is worried about the after surgery care for the patient.She  is unable to bend and cannot care for the wound. Please advise.

## 2020-08-18 NOTE — Telephone Encounter (Signed)
Called and spoke with nurse @[facility] and they have received patient's medication for antibiotic(Docycycline)

## 2020-08-19 NOTE — Telephone Encounter (Signed)
[facility] is calling and wanted to let physician know that they can provide wound after care for patient and would like to know what is needed. Please advise.

## 2020-08-22 ENCOUNTER — Other Ambulatory Visit: Payer: Self-pay

## 2020-08-22 ENCOUNTER — Telehealth: Payer: Self-pay | Admitting: Podiatry

## 2020-08-22 ENCOUNTER — Other Ambulatory Visit: Payer: Self-pay | Admitting: Podiatry

## 2020-08-22 ENCOUNTER — Encounter: Payer: Self-pay | Admitting: Internal Medicine

## 2020-08-22 ENCOUNTER — Encounter (HOSPITAL_BASED_OUTPATIENT_CLINIC_OR_DEPARTMENT_OTHER): Payer: Self-pay | Admitting: Podiatry

## 2020-08-22 MED ORDER — CLINDAMYCIN HCL 300 MG PO CAPS: 300.0000 mg | ORAL_CAPSULE | Freq: Three times a day (TID) | ORAL | 1 refills | Status: DC

## 2020-08-22 MED ORDER — GENTAMICIN SULFATE 0.1 % EX CREA: 1.0000 "application " | TOPICAL_CREAM | Freq: Two times a day (BID) | CUTANEOUS | 1 refills | Status: DC

## 2020-08-22 NOTE — Progress Notes (Signed)
PERIOPERATIVE PRESCRIPTION FOR IMPLANTED CARDIAC DEVICE PROGRAMMING  Patient Information: Name:  Ann Fletcher  DOB:  23-Jun-1932  MRN:  [id number]    Planned Procedure: BILATERAL LOWER EXTREMITY WOUND DEBRIDEMENT WITH GRAFT APPLICATION  Surgeon: DR Daylene Katayama  Date of Procedure: 08-25-2020  Cautery will be used.  Position during surgery:    Please send documentation back to:  [facility] (Fax # [phone])   Device Information:  Clinic EP Physician:  [name], MD   Device Type:  Pacemaker Manufacturer and Phone #:  Medtronic: [id number] Pacemaker Dependent?:  Yes.   Date of Last Device Check:  12/17/2019 Normal Device Function?:  Yes.    Electrophysiologist's Recommendations:   Have magnet available.  Provide continuous ECG monitoring when magnet is used or reprogramming is to be performed.   Procedure should not interfere with device function.  No device programming or magnet placement needed.  Per Device Clinic Standing Orders, Simone Curia, RN  3:51 PM 08/22/2020

## 2020-08-22 NOTE — Telephone Encounter (Signed)
[facility] called and stated they needed plan of care after the patients sx. Please call [phone]                                       Fax:[phone]

## 2020-08-22 NOTE — Progress Notes (Signed)
Spoke w/ via phone for pre-op interview---pt Lab needs dos----  I stat (per anes), surgery odrers pending            Lab results------see below COVID test -----patient states asymptomatic no test needed Arrive at -------1045 07-26-2020 NPO after MN NO Solid Food.  Clear liquids from MN until---945 am then npo Med rec completed Medications to take morning of surgery -----albuterol inhaler and bring albuterol inhaler with you, flonase, levothyroxine Diabetic medication ----- none day of surgery Patient instructed to bring photo id and insurance card day of surgery Patient aware to have Driver (ride ) / caregiver daughter madeline swaney driver/spouse addison caregiver after surgery    for 24 hours after surgery  Patient Special Instructions -----none Pre-Op special Istructions -----surgery orders req dr Amalia Hailey epic ib Patient verbalized understanding of instructions that were given at this phone interview. Patient denies shortness of breath, chest pain, fever, cough at this phone interview.  Pacemaker orders on char/epic  dr taylor for 08-25-2020 surgery Last pacer neck note 05-30-2020 epic Echo 11-01-2017 epic ekg 02-07-2020 chart/epic lov dr taylor cardiology 12-17-2019 chart/epic  Pt will be in wheelchair can transfer self  H & P from pcp dated 08-24-2020 [facility] on chart for 08-25-2020 surgery

## 2020-08-22 NOTE — Progress Notes (Signed)
PRN MRSA

## 2020-08-22 NOTE — H&P (View-Only) (Signed)
PRN MRSA

## 2020-08-24 ENCOUNTER — Encounter: Payer: Self-pay | Admitting: Podiatry

## 2020-08-25 ENCOUNTER — Ambulatory Visit (HOSPITAL_BASED_OUTPATIENT_CLINIC_OR_DEPARTMENT_OTHER)
Admission: RE | Admit: 2020-08-25 | Discharge: 2020-08-25 | Disposition: A | Discharge status: Home / Self Care | Payer: Medicare (Managed Care) | Attending: Podiatry | Admitting: Podiatry

## 2020-08-25 ENCOUNTER — Encounter (HOSPITAL_BASED_OUTPATIENT_CLINIC_OR_DEPARTMENT_OTHER)
Admission: RE | Disposition: A | Discharge status: Home / Self Care | Payer: Self-pay | Source: Home / Self Care | Attending: Podiatry

## 2020-08-25 ENCOUNTER — Ambulatory Visit (HOSPITAL_BASED_OUTPATIENT_CLINIC_OR_DEPARTMENT_OTHER): Payer: Medicare (Managed Care) | Admitting: Anesthesiology

## 2020-08-25 ENCOUNTER — Encounter: Payer: Self-pay | Admitting: Podiatry

## 2020-08-25 ENCOUNTER — Other Ambulatory Visit: Payer: Self-pay

## 2020-08-25 ENCOUNTER — Encounter (HOSPITAL_BASED_OUTPATIENT_CLINIC_OR_DEPARTMENT_OTHER): Payer: Self-pay | Admitting: Podiatry

## 2020-08-25 DIAGNOSIS — Z950 Presence of cardiac pacemaker: Secondary | ICD-10-CM | POA: Insufficient documentation

## 2020-08-25 DIAGNOSIS — L89613 Pressure ulcer of right heel, stage 3: Secondary | ICD-10-CM | POA: Diagnosis not present

## 2020-08-25 DIAGNOSIS — L89623 Pressure ulcer of left heel, stage 3: Secondary | ICD-10-CM | POA: Insufficient documentation

## 2020-08-25 DIAGNOSIS — Z888 Allergy status to other drugs, medicaments and biological substances status: Secondary | ICD-10-CM | POA: Insufficient documentation

## 2020-08-25 DIAGNOSIS — Z886 Allergy status to analgesic agent status: Secondary | ICD-10-CM | POA: Insufficient documentation

## 2020-08-25 DIAGNOSIS — Z882 Allergy status to sulfonamides status: Secondary | ICD-10-CM | POA: Insufficient documentation

## 2020-08-25 HISTORY — DX: Dyspnea, unspecified: R06.00

## 2020-08-25 HISTORY — DX: Type 2 diabetes mellitus without complications: E11.9

## 2020-08-25 HISTORY — PX: WOUND DEBRIDEMENT: SHX247

## 2020-08-25 HISTORY — PX: GRAFT APPLICATION: SHX6696

## 2020-08-25 HISTORY — DX: Dependence on other enabling machines and devices: Z99.89

## 2020-08-25 HISTORY — DX: Unspecified rotator cuff tear or rupture of unspecified shoulder, not specified as traumatic: M75.100

## 2020-08-25 HISTORY — DX: Unspecified hearing loss, right ear: H91.91

## 2020-08-25 HISTORY — DX: Presence of dental prosthetic device (complete) (partial): Z97.2

## 2020-08-25 HISTORY — DX: Presence of cardiac pacemaker: Z95.0

## 2020-08-25 LAB — POCT I-STAT, CHEM 8
BUN: 42 mg/dL — ABNORMAL HIGH (ref 8–23)
Calcium, Ion: 1.2 mmol/L (ref 1.15–1.40)
Chloride: 105 mmol/L (ref 98–111)
Creatinine, Ser: 1.3 mg/dL — ABNORMAL HIGH (ref 0.44–1.00)
Glucose, Bld: 174 mg/dL — ABNORMAL HIGH (ref 70–99)
HCT: 35 % — ABNORMAL LOW (ref 36.0–46.0)
Hemoglobin: 11.9 g/dL — ABNORMAL LOW (ref 12.0–15.0)
Potassium: 3.9 mmol/L (ref 3.5–5.1)
Sodium: 138 mmol/L (ref 135–145)
TCO2: 23 mmol/L (ref 22–32)

## 2020-08-25 LAB — GLUCOSE, CAPILLARY: Glucose-Capillary: 148 mg/dL — ABNORMAL HIGH (ref 70–99)

## 2020-08-25 SURGERY — DEBRIDEMENT, WOUND
Anesthesia: General | Site: Heel | Laterality: Bilateral

## 2020-08-25 MED ORDER — ETOMIDATE 2 MG/ML IV SOLN
INTRAVENOUS | Status: AC
  Filled 2020-08-25: qty 10

## 2020-08-25 MED ORDER — PHENYLEPHRINE 40 MCG/ML (10ML) SYRINGE FOR IV PUSH (FOR BLOOD PRESSURE SUPPORT)
PREFILLED_SYRINGE | INTRAVENOUS | Status: DC | PRN
  Administered 2020-08-25 (×3): 80 ug via INTRAVENOUS

## 2020-08-25 MED ORDER — SODIUM CHLORIDE 0.9 % IR SOLN
Status: DC | PRN
  Administered 2020-08-25: 3000 mL

## 2020-08-25 MED ORDER — PROPOFOL 10 MG/ML IV BOLUS
INTRAVENOUS | Status: AC
  Filled 2020-08-25: qty 20

## 2020-08-25 MED ORDER — CEFAZOLIN SODIUM-DEXTROSE 2-4 GM/100ML-% IV SOLN
2.0000 g | INTRAVENOUS | Status: AC
  Administered 2020-08-25: 2 g via INTRAVENOUS

## 2020-08-25 MED ORDER — LIDOCAINE 2% (20 MG/ML) 5 ML SYRINGE
INTRAMUSCULAR | Status: AC
  Filled 2020-08-25: qty 5

## 2020-08-25 MED ORDER — ETOMIDATE 2 MG/ML IV SOLN
INTRAVENOUS | Status: DC | PRN
  Administered 2020-08-25: 10 mg via INTRAVENOUS

## 2020-08-25 MED ORDER — EPHEDRINE 5 MG/ML INJ
INTRAVENOUS | Status: AC
  Filled 2020-08-25: qty 10

## 2020-08-25 MED ORDER — PHENYLEPHRINE 40 MCG/ML (10ML) SYRINGE FOR IV PUSH (FOR BLOOD PRESSURE SUPPORT)
PREFILLED_SYRINGE | INTRAVENOUS | Status: AC
  Filled 2020-08-25: qty 10

## 2020-08-25 MED ORDER — BUPIVACAINE HCL (PF) 0.5 % IJ SOLN
INTRAMUSCULAR | Status: DC | PRN
  Administered 2020-08-25: 10 mL

## 2020-08-25 MED ORDER — LIDOCAINE 2% (20 MG/ML) 5 ML SYRINGE
INTRAMUSCULAR | Status: DC | PRN
  Administered 2020-08-25: 60 mg via INTRAVENOUS

## 2020-08-25 MED ORDER — SUCCINYLCHOLINE CHLORIDE 200 MG/10ML IV SOSY
PREFILLED_SYRINGE | INTRAVENOUS | Status: AC
  Filled 2020-08-25: qty 10

## 2020-08-25 MED ORDER — ONDANSETRON HCL 4 MG/2ML IJ SOLN
INTRAMUSCULAR | Status: DC | PRN
  Administered 2020-08-25: 4 mg via INTRAVENOUS

## 2020-08-25 MED ORDER — CLINDAMYCIN HCL 300 MG PO CAPS: 300.0000 mg | ORAL_CAPSULE | Freq: Three times a day (TID) | ORAL | 1 refills | Status: DC

## 2020-08-25 MED ORDER — SUCCINYLCHOLINE CHLORIDE 20 MG/ML IJ SOLN
INTRAMUSCULAR | Status: DC | PRN
  Administered 2020-08-25: 120 mg via INTRAVENOUS

## 2020-08-25 MED ORDER — EPHEDRINE SULFATE-NACL 50-0.9 MG/10ML-% IV SOSY
PREFILLED_SYRINGE | INTRAVENOUS | Status: DC | PRN
  Administered 2020-08-25: 5 mg via INTRAVENOUS

## 2020-08-25 MED ORDER — HYDROCODONE-ACETAMINOPHEN 5-325 MG PO TABS: 1.0000 | ORAL_TABLET | ORAL | 0 refills | Status: DC | PRN

## 2020-08-25 MED ORDER — SODIUM CHLORIDE 0.9 % IV SOLN: INTRAVENOUS | Status: DC

## 2020-08-25 MED ORDER — FENTANYL CITRATE (PF) 100 MCG/2ML IJ SOLN
INTRAMUSCULAR | Status: DC | PRN
  Administered 2020-08-25: 50 ug via INTRAVENOUS

## 2020-08-25 MED ORDER — FENTANYL CITRATE (PF) 100 MCG/2ML IJ SOLN
INTRAMUSCULAR | Status: AC
  Filled 2020-08-25: qty 2

## 2020-08-25 MED ORDER — CEFAZOLIN SODIUM-DEXTROSE 2-4 GM/100ML-% IV SOLN
INTRAVENOUS | Status: AC
  Filled 2020-08-25: qty 100

## 2020-08-25 SURGICAL SUPPLY — 67 items
APL PRP STRL LF DISP 70% ISPRP (MISCELLANEOUS) ×1
APL SKNCLS STERI-STRIP NONHPOA (GAUZE/BANDAGES/DRESSINGS) ×2
BENZOIN TINCTURE PRP APPL 2/3 (GAUZE/BANDAGES/DRESSINGS) ×2 IMPLANT
BLADE HEX COATED 2.75 (ELECTRODE) ×2 IMPLANT
BLADE SAW SGTL 83.5X18.5 (BLADE) ×2 IMPLANT
BLADE SURG 15 STRL LF DISP TIS (BLADE) ×3 IMPLANT
BLADE SURG 15 STRL SS (BLADE) ×6
BNDG CMPR 9X4 STRL LF SNTH (GAUZE/BANDAGES/DRESSINGS) ×1
BNDG COHESIVE 6X5 TAN STRL LF (GAUZE/BANDAGES/DRESSINGS) IMPLANT
BNDG ELASTIC 4X5.8 VLCR STR LF (GAUZE/BANDAGES/DRESSINGS) ×2 IMPLANT
BNDG ESMARK 4X9 LF (GAUZE/BANDAGES/DRESSINGS) ×2 IMPLANT
BNDG GAUZE ELAST 4 BULKY (GAUZE/BANDAGES/DRESSINGS) ×4 IMPLANT
BOWL SMART MIX CTS (DISPOSABLE) IMPLANT
CHLORAPREP W/TINT 26 (MISCELLANEOUS) ×2 IMPLANT
COVER SURGICAL LIGHT HANDLE (MISCELLANEOUS) ×2 IMPLANT
COVER WAND RF STERILE (DRAPES) ×2 IMPLANT
CUFF TOURN SGL QUICK 18X4 (TOURNIQUET CUFF) ×2 IMPLANT
CUFF TOURN SGL QUICK 34 (TOURNIQUET CUFF) ×2
CUFF TRNQT CYL 34X4.125X (TOURNIQUET CUFF) ×1 IMPLANT
DRAPE BILATERAL LIMB T (DRAPES) ×1 IMPLANT
DRAPE C-ARM 35X43 STRL (DRAPES) ×2 IMPLANT
DRAPE SHEET LG 3/4 BI-LAMINATE (DRAPES) ×1 IMPLANT
DRAPE U-SHAPE 47X51 STRL (DRAPES) ×2 IMPLANT
DRSG EMULSION OIL 3X3 NADH (GAUZE/BANDAGES/DRESSINGS) ×2 IMPLANT
DRSG PAD ABDOMINAL 8X10 ST (GAUZE/BANDAGES/DRESSINGS) ×3 IMPLANT
ELECT REM PT RETURN 9FT ADLT (ELECTROSURGICAL)
ELECTRODE REM PT RTRN 9FT ADLT (ELECTROSURGICAL) IMPLANT
GAUZE SPONGE 4X4 12PLY STRL (GAUZE/BANDAGES/DRESSINGS) ×2 IMPLANT
GAUZE SPONGE 4X4 12PLY STRL LF (GAUZE/BANDAGES/DRESSINGS) ×2 IMPLANT
GAUZE XEROFORM 1X8 LF (GAUZE/BANDAGES/DRESSINGS) ×3 IMPLANT
GLOVE SRG 8 PF TXTR STRL LF DI (GLOVE) ×1 IMPLANT
GLOVE SURG ENC MOIS LTX SZ8 (GLOVE) ×2 IMPLANT
GLOVE SURG UNDER POLY LF SZ6.5 (GLOVE) ×2 IMPLANT
GLOVE SURG UNDER POLY LF SZ7 (GLOVE) ×1 IMPLANT
GLOVE SURG UNDER POLY LF SZ7.5 (GLOVE) ×2 IMPLANT
GLOVE SURG UNDER POLY LF SZ8 (GLOVE) ×2
GOWN STRL REUS W/TWL LRG LVL3 (GOWN DISPOSABLE) ×4 IMPLANT
HANDPIECE INTERPULSE COAX TIP (DISPOSABLE) ×2
IV NS IRRIG 3000ML ARTHROMATIC (IV SOLUTION) ×1 IMPLANT
KIT TURNOVER CYSTO (KITS) ×2 IMPLANT
MANIFOLD NEPTUNE II (INSTRUMENTS) ×2 IMPLANT
MATRIX WOUND 3-LAYER 7X10 (Tissue) ×1 IMPLANT
MICROMATRIX 500MG (Tissue) ×2 IMPLANT
NDL HYPO 25X1 1.5 SAFETY (NEEDLE) ×1 IMPLANT
NEEDLE HYPO 25X1 1.5 SAFETY (NEEDLE) ×2 IMPLANT
NS IRRIG 1000ML POUR BTL (IV SOLUTION) ×2 IMPLANT
PACK BASIN DAY SURGERY FS (CUSTOM PROCEDURE TRAY) ×2 IMPLANT
PACK ORTHO EXTREMITY (CUSTOM PROCEDURE TRAY) ×2 IMPLANT
PAD ARMBOARD 7.5X6 YLW CONV (MISCELLANEOUS) ×4 IMPLANT
PAD CAST 4YDX4 CTTN HI CHSV (CAST SUPPLIES) ×1 IMPLANT
PADDING CAST COTTON 4X4 STRL (CAST SUPPLIES) ×2
PADDING CAST COTTON 6X4 STRL (CAST SUPPLIES) ×2 IMPLANT
SET HNDPC FAN SPRY TIP SCT (DISPOSABLE) IMPLANT
SOL PREP PROV IODINE SCRUB 4OZ (MISCELLANEOUS) ×2 IMPLANT
SOL TOPI POVIDONE IODINE PAINT (MISCELLANEOUS) ×2 IMPLANT
SOLUTION PARTIC MCRMTRX 500MG (Tissue) IMPLANT
STAPLER VISISTAT 35W (STAPLE) ×2 IMPLANT
STOCKINETTE IMPERVIOUS LG (DRAPES) ×2 IMPLANT
STRIP CLOSURE SKIN 1/2X4 (GAUZE/BANDAGES/DRESSINGS) ×2 IMPLANT
SUT PROLENE 3 0 PS 2 (SUTURE) ×2 IMPLANT
SWAB CULTURE LIQ STUART DBL (MISCELLANEOUS) ×2 IMPLANT
SWAB CULTURE LIQUID MINI MALE (MISCELLANEOUS) ×2 IMPLANT
SYR CONTROL 10ML LL (SYRINGE) ×2 IMPLANT
TOWEL OR 17X26 10 PK STRL BLUE (TOWEL DISPOSABLE) ×4 IMPLANT
TRAY DSU PREP LF (CUSTOM PROCEDURE TRAY) ×2 IMPLANT
TUBE CONNECTING 12X1/4 (SUCTIONS) ×2 IMPLANT
YANKAUER SUCT BULB TIP NO VENT (SUCTIONS) ×2 IMPLANT

## 2020-08-25 NOTE — Anesthesia Preprocedure Evaluation (Addendum)
Anesthesia Evaluation  Patient identified by MRN, date of birth, ID band Patient awake    Reviewed: Allergy & Precautions, NPO status , Patient's Chart, lab work & pertinent test results  Airway Mallampati: II  TM Distance: >3 FB     Dental   Pulmonary shortness of breath,    breath sounds clear to auscultation       Cardiovascular hypertension, +CHF  + dysrhythmias + pacemaker  Rhythm:Regular Rate:Normal     Neuro/Psych negative neurological ROS  negative psych ROS   GI/Hepatic negative GI ROS, Neg liver ROS,   Endo/Other  diabetesHypothyroidism   Renal/GU Renal disease     Musculoskeletal  (+) Arthritis ,   Abdominal   Peds  Hematology  (+) anemia ,   Anesthesia Other Findings   Reproductive/Obstetrics                            Anesthesia Physical Anesthesia Plan  ASA: III  Anesthesia Plan: General   Post-op Pain Management:    Induction: Intravenous  PONV Risk Score and Plan: 3 and Ondansetron and Dexamethasone  Airway Management Planned: Oral ETT  Additional Equipment:   Intra-op Plan:   Post-operative Plan: Extubation in OR  Informed Consent: I have reviewed the patients History and Physical, chart, labs and discussed the procedure including the risks, benefits and alternatives for the proposed anesthesia with the patient or authorized representative who has indicated his/her understanding and acceptance.     Dental advisory given  Plan Discussed with: CRNA and Anesthesiologist  Anesthesia Plan Comments:        Anesthesia Quick Evaluation

## 2020-08-25 NOTE — Interval H&P Note (Signed)
History and Physical Interval Note:  08/25/2020 1:19 PM  Ann Fletcher  has presented today for surgery, with the diagnosis of BILATERAL PRESSURE ULCERS.  The various methods of treatment have been discussed with the patient and family. After consideration of risks, benefits and other options for treatment, the patient has consented to  Procedure(s): DEBRIDEMENT WOUND BILATERAL FEET (Bilateral) GRAFT APPLICATION (Bilateral) as a surgical intervention.  The patient's history has been reviewed, patient examined, no change in status, stable for surgery.  I have reviewed the patient's chart and labs.  Questions were answered to the patient's satisfaction.     Edrick Kins

## 2020-08-25 NOTE — Transfer of Care (Signed)
Immediate Anesthesia Transfer of Care Note  Patient: Ann Fletcher  Procedure(s) Performed: DEBRIDEMENT WOUND BILATERAL FEET (Bilateral Heel) GRAFT APPLICATION  ACELL (Bilateral Heel)  Patient Location: PACU  Anesthesia Type:General  Level of Consciousness: awake, alert , oriented and patient cooperative  Airway & Oxygen Therapy: Patient Spontanous Breathing  Post-op Assessment: Report given to RN and Post -op Vital signs reviewed and stable  Post vital signs: Reviewed and stable  Last Vitals:  Vitals Value Taken Time  BP    Temp    Pulse    Resp    SpO2      Last Pain:  Vitals:   08/25/20 1139  TempSrc: Oral  PainSc: 0-No pain         Complications: No complications documented.

## 2020-08-25 NOTE — Anesthesia Procedure Notes (Signed)
Procedure Name: Intubation Date/Time: 08/25/2020 1:56 PM Performed by: Justice Rocher, CRNA Pre-anesthesia Checklist: Patient identified, Emergency Drugs available, Suction available and Patient being monitored Patient Re-evaluated:Patient Re-evaluated prior to induction Oxygen Delivery Method: Circle System Utilized Preoxygenation: Pre-oxygenation with 100% oxygen Induction Type: IV induction Ventilation: Mask ventilation without difficulty Laryngoscope Size: Mac and 3 Grade View: Grade I Tube type: Oral Tube size: 7.0 mm Number of attempts: 1 Airway Equipment and Method: Stylet Placement Confirmation: ETT inserted through vocal cords under direct vision,  positive ETCO2 and breath sounds checked- equal and bilateral Secured at: 22 cm Tube secured with: Tape Dental Injury: Teeth and Oropharynx as per pre-operative assessment

## 2020-08-25 NOTE — Discharge Instructions (Signed)

## 2020-08-25 NOTE — Brief Op Note (Signed)
08/25/2020  2:42 PM  PATIENT:  Ann Fletcher  87 y.o. female  PRE-OPERATIVE DIAGNOSIS:  BILATERAL PRESSURE ULCERS  POST-OPERATIVE DIAGNOSIS:  BILATERAL PRESSURE ULCERS  PROCEDURE:  Procedure(s): DEBRIDEMENT WOUND BILATERAL FEET (Bilateral) GRAFT APPLICATION  ACELL (Bilateral)  SURGEON:  Surgeon(s) and Role:    Edrick Kins, DPM - Primary  PHYSICIAN ASSISTANT:   ASSISTANTS: none   ANESTHESIA:   local and general  EBL:  minimal   BLOOD ADMINISTERED:none  DRAINS: none   LOCAL MEDICATIONS USED:  MARCAINE     SPECIMEN:  No Specimen  DISPOSITION OF SPECIMEN:  N/A  COUNTS:  YES  TOURNIQUET:  * No tourniquets in log *  DICTATION: .Dragon Dictation  PLAN OF CARE: Discharge to home after PACU  PATIENT DISPOSITION:  PACU - hemodynamically stable.   Delay start of Pharmacological VTE agent (>24hrs) due to surgical blood loss or risk of bleeding: not applicable

## 2020-08-26 ENCOUNTER — Encounter (HOSPITAL_BASED_OUTPATIENT_CLINIC_OR_DEPARTMENT_OTHER): Payer: Self-pay | Admitting: Podiatry

## 2020-08-26 NOTE — Op Note (Signed)
OPERATIVE REPORT Patient name: Ann Fletcher MRN: [id number] DOB: 18-Jan-1933  DOS: 08/25/2020  Preop Dx: Decubitus ulcers bilateral posterior heels Postop Dx: same  Procedure:  1.  Excisional debridement of ulcers bilateral heels with application of wound graft  Surgeon: Edrick Kins DPM  Anesthesia: 50-50 mixture of 2% lidocaine plain with 0.5% Marcaine plain totaling 10 mL infiltrated in the patient's bilateral lower extremity  Hemostasis: None  EBL: 10 mL Materials: Acell Cytal wound graft. Acell micro matrix 500 g.  Injectables: None Pathology: None  Condition: The patient tolerated the procedure and anesthesia well. No complications noted or reported   Justification for procedure: The patient is a 87 y.o. female who presents today for surgical correction of painful symptomatic decubitus heel ulcers bilateral. All conservative modalities of been unsuccessful in providing any sort of satisfactory alleviation of symptoms with the patient. The patient was told benefits as well as possible side effects of the surgery. The patient consented for surgical correction. The patient consent form was reviewed. All patient questions were answered. No guarantees were expressed or implied. The patient and the surgeon boson the patient consent form with the witness present and placed in the patient's chart.   Procedure in Detail: The patient was brought to the operating room, placed in the operating table in the prone position at which time an aseptic scrub and drape were performed about the patient's respective lower extremity after anesthesia was induced as described above. Attention was then directed to the surgical area where procedure number one commenced.  Procedure #1: Excisional debridement bilateral posterior heel ulcers with application of graft Medically necessary excisional debridement including deep fascial tissue was performed using a #15 scalpel to the bilateral posterior  heels.  Excisional debridement of all the necrotic nonviable tissue down to healthy bleeding viable tissue was performed with postdebridement measurement same as pre-.   Right posterior heel wound measures 1.0 x 1.0 x 0.2 cm. Left posterior heel wound measures 3.0 x 2.0 x 0.2 cm.  After appropriate debridement of the wounds and copious irrigation using pulse lavage, Acell micro matrix 500 g was applied directly to the wound bases followed by Acell Cytal wound graft which was adhered and fixated using Steri-Strips.  Surgical lube and Adaptic were then applied overlying the graft to maintain moisture.   Dry sterile compressive dressings were then applied to all previously mentioned incision sites about the patient's lower extremity. The tourniquet which was used for hemostasis was deflated. All normal neurovascular responses including pink color and warmth returned all the digits of patient's lower extremity.  The patient was then transferred from the operating room to the recovery room having tolerated the procedure and anesthesia well. All vital signs are stable. After a brief stay in the recovery room the patient was discharged with adequate prescriptions for analgesia. Verbal as well as written instructions were provided for the patient regarding wound care. The patient is to keep the dressings clean dry and intact until they are to follow surgeon Dr. Daylene Katayama in the office upon discharge.   Edrick Kins, DPM [facility]  Dr. Edrick Kins, DPM    [address], [place] [address]  Office [phone]  Fax [phone]

## 2020-08-26 NOTE — Anesthesia Postprocedure Evaluation (Signed)
Anesthesia Post Note  Patient: Ann Fletcher  Procedure(s) Performed: DEBRIDEMENT WOUND BILATERAL FEET (Bilateral Heel) GRAFT APPLICATION  ACELL (Bilateral Heel)     Patient location during evaluation: PACU Anesthesia Type: General Level of consciousness: awake and alert Pain management: pain level controlled Vital Signs Assessment: post-procedure vital signs reviewed and stable Respiratory status: spontaneous breathing, nonlabored ventilation, respiratory function stable and patient connected to nasal cannula oxygen Cardiovascular status: blood pressure returned to baseline and stable Postop Assessment: no apparent nausea or vomiting Anesthetic complications: no   No complications documented.  Last Vitals:  Vitals:   08/25/20 1520 08/25/20 1550  BP:  136/82  Pulse:    Resp: 12 18  Temp:    SpO2: 98% 98%    Last Pain:  Vitals:   08/25/20 1527  TempSrc:   PainSc: 0-No pain                 Braeleigh Pyper S

## 2020-08-30 ENCOUNTER — Ambulatory Visit (INDEPENDENT_AMBULATORY_CARE_PROVIDER_SITE_OTHER): Payer: Medicare (Managed Care)

## 2020-08-30 DIAGNOSIS — I4819 Other persistent atrial fibrillation: Secondary | ICD-10-CM

## 2020-08-30 LAB — CUP PACEART REMOTE DEVICE CHECK
Battery Remaining Longevity: 108 mo
Battery Voltage: 3 V
Brady Statistic AP VP Percent: 0 %
Brady Statistic AP VS Percent: 0 %
Brady Statistic AS VP Percent: 99.91 %
Brady Statistic AS VS Percent: 0.09 %
Brady Statistic RA Percent Paced: 0 %
Brady Statistic RV Percent Paced: 99.91 %
Date Time Interrogation Session: 20220531010151
Implantable Lead Implant Date: 20190524
Implantable Lead Implant Date: 20190524
Implantable Lead Location: 753859
Implantable Lead Location: 753860
Implantable Lead Model: 3830
Implantable Lead Model: 5076
Implantable Pulse Generator Implant Date: 20190524
Lead Channel Impedance Value: 399 Ohm
Lead Channel Impedance Value: 437 Ohm
Lead Channel Impedance Value: 513 Ohm
Lead Channel Impedance Value: 570 Ohm
Lead Channel Pacing Threshold Amplitude: 0.75 V
Lead Channel Pacing Threshold Pulse Width: 0.4 ms
Lead Channel Sensing Intrinsic Amplitude: 1.5 mV
Lead Channel Sensing Intrinsic Amplitude: 6 mV
Lead Channel Sensing Intrinsic Amplitude: 6 mV
Lead Channel Setting Pacing Amplitude: 2.5 V
Lead Channel Setting Pacing Pulse Width: 0.4 ms
Lead Channel Setting Sensing Sensitivity: 4 mV

## 2020-08-31 ENCOUNTER — Encounter: Payer: Self-pay | Admitting: Podiatry

## 2020-08-31 ENCOUNTER — Other Ambulatory Visit: Payer: Self-pay

## 2020-08-31 ENCOUNTER — Ambulatory Visit (INDEPENDENT_AMBULATORY_CARE_PROVIDER_SITE_OTHER): Payer: Medicare (Managed Care) | Admitting: Podiatry

## 2020-08-31 DIAGNOSIS — E119 Type 2 diabetes mellitus without complications: Secondary | ICD-10-CM

## 2020-08-31 DIAGNOSIS — L89613 Pressure ulcer of right heel, stage 3: Secondary | ICD-10-CM | POA: Diagnosis not present

## 2020-08-31 DIAGNOSIS — L89623 Pressure ulcer of left heel, stage 3: Secondary | ICD-10-CM

## 2020-08-31 MED ORDER — CLINDAMYCIN HCL 300 MG PO CAPS: 300.0000 mg | ORAL_CAPSULE | Freq: Three times a day (TID) | ORAL | 1 refills | Status: DC

## 2020-08-31 NOTE — Progress Notes (Signed)
   Subjective:  Patient presents today status post excisional debridement of bilateral heels with application of Acell wound graft. DOS: 08/25/2020.  Patient states that she is feeling very well.  She has kept the dressings clean dry and intact.  No new complaints at this time  Past Medical History:  Diagnosis Date  . Arthritis    hands and back, back spinal stenosis  . AV node arrhythmia    a. s/p AV node ablation/PPM 07/2017.  [name] Chronic deafness in right ear   . Chronic diastolic CHF (congestive heart failure) ([place])    a. Acute exacerbation occurred in setting of AF.  [name] CKD (chronic kidney disease), stage III ([place])   . DM type 2 (diabetes mellitus, type 2) ([place])   . Dyspnea    with exertion  . 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 Hyperlipidemia   . Hypertension   . Hypothyroidism   . Lower extremity edema    props up when can swelling then goes down (08-22-2020)  . Osteoporosis   . Persistent atrial fibrillation ([place])    a. diagnosed in 10/2014 by PCP, underwent failed TEE DCCV on 10/08/14, loaded with amio, successfully DCCV on 10/15/14.  . Presence of permanent cardiac pacemaker   . Rotator cuff tear since april 2022  . Uses walker   . Wears dentures    full set      Objective/Physical Exam Neurovascular status intact.  The ulcers to the bilateral heels have the wound graft intact and well adhered with Steri-Strips.  No sign of infectious process noted. No dehiscence. No active bleeding noted.  No malodor.  Assessment: 1. s/p excisional debridement of bilateral heels with application of wound graft. DOS: 08/25/2020   Plan of Care:  1. Patient was evaluated.  2.  Patient's prior cultures grew MRSA positive.  Continue oral clindamycin 300 mg 3 times daily as prescribed.  Refill provided today. 3.  Dressings changed today.  Keep clean dry and intact x1 week 4.  Postsurgical shoes dispensed today.  Weightbearing as tolerated 5.  Return to clinic in 1 week   Edrick Kins, DPM [facility]  Dr. Edrick Kins, DPM    [address], [place] [address]                Office [phone]  Fax [phone]

## 2020-09-01 ENCOUNTER — Telehealth: Payer: Self-pay | Admitting: Podiatry

## 2020-09-01 NOTE — Telephone Encounter (Signed)
Carmell Austria From [facility] called about Ann Fletcher calling for clarification for her antibiotic.      Please Advise.

## 2020-09-06 ENCOUNTER — Other Ambulatory Visit (HOSPITAL_COMMUNITY): Payer: Self-pay | Admitting: Orthopaedic Surgery

## 2020-09-06 ENCOUNTER — Encounter: Payer: Self-pay | Admitting: Hematology & Oncology

## 2020-09-06 ENCOUNTER — Other Ambulatory Visit: Payer: Self-pay

## 2020-09-06 ENCOUNTER — Ambulatory Visit (HOSPITAL_COMMUNITY)
Admission: RE | Admit: 2020-09-06 | Discharge: 2020-09-06 | Disposition: A | Discharge status: Home / Self Care | Payer: Medicare (Managed Care) | Source: Ambulatory Visit | Attending: Orthopaedic Surgery | Admitting: Orthopaedic Surgery

## 2020-09-06 DIAGNOSIS — M7989 Other specified soft tissue disorders: Secondary | ICD-10-CM | POA: Diagnosis not present

## 2020-09-06 DIAGNOSIS — M79604 Pain in right leg: Secondary | ICD-10-CM | POA: Diagnosis not present

## 2020-09-07 ENCOUNTER — Ambulatory Visit (INDEPENDENT_AMBULATORY_CARE_PROVIDER_SITE_OTHER): Payer: Medicare (Managed Care) | Admitting: Podiatry

## 2020-09-07 DIAGNOSIS — L97412 Non-pressure chronic ulcer of right heel and midfoot with fat layer exposed: Secondary | ICD-10-CM

## 2020-09-07 DIAGNOSIS — E119 Type 2 diabetes mellitus without complications: Secondary | ICD-10-CM | POA: Diagnosis not present

## 2020-09-07 DIAGNOSIS — L97402 Non-pressure chronic ulcer of unspecified heel and midfoot with fat layer exposed: Secondary | ICD-10-CM

## 2020-09-07 DIAGNOSIS — E08621 Diabetes mellitus due to underlying condition with foot ulcer: Secondary | ICD-10-CM

## 2020-09-07 NOTE — Progress Notes (Signed)
   Subjective:  Patient presents today status post excisional debridement of bilateral heels with application of Acell wound graft. DOS: 08/25/2020.  Patient states that she is feeling very well.  She has kept the dressings clean dry and intact.  No new complaints at this time  Past Medical History:  Diagnosis Date  . Arthritis    hands and back, back spinal stenosis  . AV node arrhythmia    a. s/p AV node ablation/PPM 07/2017.  [name] Chronic deafness in right ear   . Chronic diastolic CHF (congestive heart failure) ([place])    a. Acute exacerbation occurred in setting of AF.  [name] CKD (chronic kidney disease), stage III ([place])   . DM type 2 (diabetes mellitus, type 2) ([place])   . Dyspnea    with exertion  . 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 Hyperlipidemia   . Hypertension   . Hypothyroidism   . Lower extremity edema    props up when can swelling then goes down (08-22-2020)  . Osteoporosis   . Persistent atrial fibrillation ([place])    a. diagnosed in 10/2014 by PCP, underwent failed TEE DCCV on 10/08/14, loaded with amio, successfully DCCV on 10/15/14.  . Presence of permanent cardiac pacemaker   . Rotator cuff tear since april 2022  . Uses walker   . Wears dentures    full set        Objective/Physical Exam Neurovascular status intact.  The wound to the left posterior heel has completely healed.  Complete reepithelialization has occurred.  Wound noted to the RT heel measures approximately 3.0 x 6.0 x 0.3 cm.  To the noted ulceration, there is a moderate amount of slough fibrin and necrotic tissue noted.  Granulation tissue and wound base is red.  There is intermittent fibrotic tissue.  There is no exposed bone muscle tendon ligament or joint.  No malodor noted.  Moderate serous drainage noted.  Periwound is intact  Assessment: 1. s/p excisional debridement of bilateral heels with application of wound graft. DOS: 08/25/2020 2.  Ulcer right posterior heel secondary to diabetes  mellitus   Plan of Care:  1. Patient was evaluated.  2.  Patient's prior cultures grew MRSA positive.  Continue oral clindamycin 300 mg 3 times daily as prescribed.  3. Medihoney was provided to apply daily while we try and get approval for skin graft application here in the office 4.  Patient has now failed conservative treatment for greater than 6 weeks despite routine debridements and care.  Authorization request for Apligraf to be applied in office 5.  Return to clinic in 2 weeks  Edrick Kins, DPM [facility]  Dr. Edrick Kins, DPM    [address], [place] [address]                Office [phone]  Fax [phone]

## 2020-09-21 ENCOUNTER — Other Ambulatory Visit: Payer: Self-pay

## 2020-09-21 ENCOUNTER — Ambulatory Visit (INDEPENDENT_AMBULATORY_CARE_PROVIDER_SITE_OTHER): Payer: Medicare (Managed Care) | Admitting: Podiatry

## 2020-09-21 DIAGNOSIS — L97402 Non-pressure chronic ulcer of unspecified heel and midfoot with fat layer exposed: Secondary | ICD-10-CM | POA: Diagnosis not present

## 2020-09-21 DIAGNOSIS — E119 Type 2 diabetes mellitus without complications: Secondary | ICD-10-CM

## 2020-09-21 DIAGNOSIS — E08621 Diabetes mellitus due to underlying condition with foot ulcer: Secondary | ICD-10-CM

## 2020-09-21 DIAGNOSIS — L97412 Non-pressure chronic ulcer of right heel and midfoot with fat layer exposed: Secondary | ICD-10-CM

## 2020-09-21 NOTE — Progress Notes (Signed)
   Subjective:  Patient presents today status post excisional debridement of bilateral heels with application of Acell wound graft. DOS: 08/25/2020.  Patient states that she is feeling very well.  She has kept the dressings clean dry and intact.  No new complaints at this time  Past Medical History:  Diagnosis Date   Arthritis    hands and back, back spinal stenosis   AV node arrhythmia    a. s/p AV node ablation/PPM 07/2017.   Chronic deafness in right ear    Chronic diastolic CHF (congestive heart failure) ([place])    a. Acute exacerbation occurred in setting of AF.   CKD (chronic kidney disease), stage III ([place])    DM type 2 (diabetes mellitus, type 2) ([place])    Dyspnea    with exertion   Erythropoietin deficiency anemia 08/28/2018   Hyperlipidemia    Hypertension    Hypothyroidism    Lower extremity edema    props up when can swelling then goes down (08-22-2020)   Osteoporosis    Persistent atrial fibrillation ([place])    a. diagnosed in 10/2014 by PCP, underwent failed TEE DCCV on 10/08/14, loaded with amio, successfully DCCV on 10/15/14.   Presence of permanent cardiac pacemaker    Rotator cuff tear since april 2022   Uses walker    Wears dentures    full set       Objective/Physical Exam Neurovascular status intact.  The wound to the left posterior heel has completely healed.  Complete reepithelialization has occurred.  Wound noted to the RT heel measures approximately 4.5 x 2.0 x 0.5 cm.  To the noted ulceration, there is a moderate amount of slough fibrin and necrotic tissue noted.  Granulation tissue and wound base is red.  There is intermittent fibrotic tissue.  There is no exposed bone muscle tendon ligament or joint.  No malodor noted.  Moderate serous drainage noted.  Periwound is intact  Assessment: 1. s/p excisional debridement of bilateral heels with application of wound graft. DOS: 08/25/2020 2.  Ulcer right posterior heel secondary to diabetes mellitus   Plan of Care:   1. Patient was evaluated.  2.  Patient has completed the oral antibiotics as prescribed. 3.  Excisional debridement of the necrotic tissue was performed using a tissue nipper down to healthier bleeding viable tissue 4.  Application of 25 cm of PuraPlyXT fenestrated wound matrix applied to the posterior heel wound to a total wound measurement area of 9.0 cm.  Please note this graft is medically necessary.  The patient has failed all conservative treatment including routine debridements with minimal improvement of the wound. 5.  Dressings applied 6.  Return to clinic in 1 week for reapplication of graft  Edrick Kins, DPM [facility]  Dr. Edrick Kins, DPM    [address], [place] [address]                Office [phone]  Fax [phone]

## 2020-09-22 ENCOUNTER — Telehealth: Payer: Self-pay | Admitting: *Deleted

## 2020-09-22 NOTE — Telephone Encounter (Signed)
Ann Fletcher, nurse  w/ [facility] [phone]) is calling for instructions on how to care for wound. Please call or fax to :[phone].

## 2020-09-22 NOTE — Progress Notes (Signed)
Remote pacemaker transmission.   

## 2020-09-22 NOTE — Telephone Encounter (Signed)
I put a graft on her wound. Leave the dressing clean and dry and leave it alone for the week. Do not remove dressings until I see her. - Dr. Amalia Hailey

## 2020-09-23 NOTE — Telephone Encounter (Signed)
Returned call to Jackie(nurse), no answer, left vmessage as to Dr Amalia Hailey instructions for wound care.

## 2020-09-26 ENCOUNTER — Telehealth: Payer: Self-pay | Admitting: *Deleted

## 2020-09-27 ENCOUNTER — Ambulatory Visit (INDEPENDENT_AMBULATORY_CARE_PROVIDER_SITE_OTHER): Payer: Medicare (Managed Care) | Admitting: Internal Medicine

## 2020-09-27 ENCOUNTER — Encounter: Payer: Self-pay | Admitting: Internal Medicine

## 2020-09-27 ENCOUNTER — Other Ambulatory Visit: Payer: Self-pay

## 2020-09-27 VITALS — BP 138/62 | HR 76 | Ht 62.0 in | Wt 143.4 lb

## 2020-09-27 DIAGNOSIS — Z950 Presence of cardiac pacemaker: Secondary | ICD-10-CM | POA: Diagnosis not present

## 2020-09-27 DIAGNOSIS — I442 Atrioventricular block, complete: Secondary | ICD-10-CM

## 2020-09-27 DIAGNOSIS — I10 Essential (primary) hypertension: Secondary | ICD-10-CM

## 2020-09-27 DIAGNOSIS — I4819 Other persistent atrial fibrillation: Secondary | ICD-10-CM

## 2020-09-27 NOTE — Patient Instructions (Signed)
Medication Instructions:  Your physician recommends that you continue on your current medications as directed. Please refer to the Current Medication list given to you today.  Labwork: None ordered.  Testing/Procedures: None ordered.  Follow-Up: Your physician wants you to follow-up in: one year with [name], MD or one of the following Advanced Practice Providers on your designated Care Team:   Tommye Standard, [place] Legrand Como "Jonni Sanger" Chalmers Cater, [place]  Remote monitoring is used to monitor your Pacemaker from home. This monitoring reduces the number of office visits required to check your device to one time per year. It allows [place] to keep an eye on the functioning of your device to ensure it is working properly. You are scheduled for a device check from home on 11/29/2020. You may send your transmission at any time that day. If you have a wireless device, the transmission will be sent automatically. After your physician reviews your transmission, you will receive a postcard with your next transmission date.  Any Other Special Instructions Will Be Listed Below (If Applicable).  If you need a refill on your cardiac medications before your next appointment, please call your pharmacy.

## 2020-09-27 NOTE — Progress Notes (Signed)
HPI Ann Fletcher returns today for followup of her atrial fib. She is a pleasant 87 yo woman with uncontrolled atrial fib who underwent AV node ablation and insertion of a DDD PM with atrial lead in the his bundle location about 3 years ago. She has done well in the interim. She was noted to have non-capture of her His lead the day after her procedure. To obtain CHB, we had to ablate fairly close to her AV node. 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 She has done well in the interim pacing in the ventricle. She denies palpitations, chest pain or sob. She has mild dyspnea with exertion but she uses a walker.   She broke her hip in the fall and has been slow to heal.  Allergies  Allergen Reactions   Tizanidine Hcl Other (See Comments)    Dizziness, patient reports fall   Nsaids Itching and Other (See Comments)    Stomach cramps    Pradaxa [Dabigatran Etexilate Mesylate] Nausea And Vomiting   Sulfa Antibiotics Nausea And Vomiting   Betadine [Povidone Iodine] Itching and Rash    On skin     Current Outpatient Medications  Medication Sig Dispense Refill   acetaminophen (TYLENOL) 500 MG tablet Take 1,000 mg by mouth at bedtime.     albuterol (PROVENTIL HFA;VENTOLIN HFA) 108 (90 BASE) MCG/ACT inhaler Inhale 2 puffs into the lungs 3 (three) times daily.     aspirin 81 MG chewable tablet Chew 81 mg by mouth daily.     atorvastatin (LIPITOR) 40 MG tablet Take 40 mg by mouth at bedtime.     Calcium Carbonate-Vitamin D (OSCAL 500/200 D-3 PO) Take 1 tablet by mouth daily.     clindamycin (CLEOCIN) 300 MG capsule Take 1 capsule (300 mg total) by mouth 3 (three) times daily. 30 capsule 1   Cyanocobalamin (VITAMIN B 12 PO) Take 1,000 mcg by mouth daily.     fluticasone (FLONASE) 50 MCG/ACT nasal spray Place 1 spray into both nostrils daily as needed for allergies.      furosemide (LASIX) 80 MG tablet TAKE ONE WHOLE TABLET (80MG ) IN THE MORNING AND HALF TABLET (40 MG) IN THE EVENING.  45 tablet 5   gentamicin cream (GARAMYCIN) 0.1 % Apply 1 application topically 2 (two) times daily. 30 g 1   glimepiride (AMARYL) 2 MG tablet Take 2 mg by mouth every morning.     HYDROcodone-acetaminophen (NORCO/VICODIN) 5-325 MG tablet Take 1 tablet by mouth every 4 (four) hours as needed for moderate pain. 30 tablet 0   Lancet Devices (PRODIGY LANCING DEVICE) MISC See admin instructions.     levothyroxine (SYNTHROID, LEVOTHROID) 100 MCG tablet Take 100 mcg by mouth daily before breakfast.      ondansetron (ZOFRAN) 4 MG tablet Take 4 mg by mouth every 6 (six) hours as needed for vomiting or nausea/vomiting.     potassium chloride SA (K-DUR,KLOR-CON) 20 MEQ tablet Take 20 mEq by mouth 2 (two) times daily.  90 tablet 3   Prodigy Twist Top Lancets 28G MISC CHECK BLOOD GLUCOSE (SUGAR) TWICE DAILY     vitamin E (VITAMIN E) 180 MG (400 UNITS) capsule Take 400 Units by mouth daily.      No current facility-administered medications for this visit.     Past Medical History:  Diagnosis Date   Arthritis    hands and back, back spinal stenosis   AV node arrhythmia    a. s/p AV node ablation/PPM  07/2017.   Chronic deafness in right ear    Chronic diastolic CHF (congestive heart failure) ([place])    a. Acute exacerbation occurred in setting of AF.   CKD (chronic kidney disease), stage III ([place])    DM type 2 (diabetes mellitus, type 2) ([place])    Dyspnea    with exertion   Erythropoietin deficiency anemia 08/28/2018   Hyperlipidemia    Hypertension    Hypothyroidism    Lower extremity edema    props up when can swelling then goes down (08-22-2020)   Osteoporosis    Persistent atrial fibrillation ([place])    a. diagnosed in 10/2014 by PCP, underwent failed TEE DCCV on 10/08/14, loaded with amio, successfully DCCV on 10/15/14.   Presence of permanent cardiac pacemaker    Rotator cuff tear since april 2022   Uses walker    Wears dentures    full set    ROS:   All systems reviewed and negative except  as noted in the HPI.   Past Surgical History:  Procedure Laterality Date   AV NODE ABLATION N/A 08/23/2017   Procedure: AV NODE ABLATION;  Surgeon: [name], MD;  Location: [place] [facility] [facility];  Service: Cardiovascular;  Laterality: N/A;   BACK SURGERY  2001, 2003, 2006   x 3 lower back has rods and screws in back   BIOPSY  11/06/2017   Procedure: BIOPSY;  Surgeon: [name], MD;  Location: [facility];  Service: Endoscopy;;   CARDIOVERSION N/A 10/08/2014   Procedure: CARDIOVERSION;  Surgeon: [name], MD;  Location: [facility];  Service: Cardiovascular;  Laterality: N/A;   CARDIOVERSION N/A 10/15/2014   Procedure: CARDIOVERSION;  Surgeon: [name], MD;  Location: [facility];  Service: Cardiovascular;  Laterality: N/A;   CARDIOVERSION N/A 11/15/2014   Procedure: CARDIOVERSION;  Surgeon: [name], MD;  Location: [facility];  Service: Cardiovascular;  Laterality: N/A;   COLONOSCOPY N/A 11/06/2017   Procedure: COLONOSCOPY;  Surgeon: [name], MD;  Location: [facility];  Service: Endoscopy;  Laterality: N/A;   ESOPHAGOGASTRODUODENOSCOPY N/A 11/06/2017   Procedure: ESOPHAGOGASTRODUODENOSCOPY (EGD);  Surgeon: [name], MD;  Location: [facility];  Service: Endoscopy;  Laterality: N/A;   GRAFT APPLICATION Bilateral 0/25/4270   Procedure: GRAFT APPLICATION  ACELL;  Surgeon: Edrick Kins, DPM;  Location: [facility];  Service: Podiatry;  Laterality: Bilateral;   INTRAMEDULLARY (IM) NAIL INTERTROCHANTERIC Right 02/08/2020   Procedure: INTRAMEDULLARY (IM) NAIL INTERTROCHANTRIC;  Surgeon: Hiram Gash, MD;  Location: [facility];  Service: Orthopedics;  Laterality: Right;   PACEMAKER IMPLANT N/A 08/23/2017   Procedure: PACEMAKER IMPLANT;  Surgeon: [name], MD;  Location: [place] [facility];  Service: Cardiovascular;  Laterality: N/A;   POLYPECTOMY  11/06/2017   Procedure: POLYPECTOMY;  Surgeon: [name], MD;  Location: [place];   Service: Endoscopy;;   REPLACEMENT TOTAL KNEE Right 07/2008   TEE WITHOUT CARDIOVERSION N/A 10/08/2014   Procedure: TRANSESOPHAGEAL ECHOCARDIOGRAM (TEE);  Surgeon: [name], MD;  Location: [facility];  Service: Cardiovascular;  Laterality: N/A;   TUBAL LIGATION  yrs ago   WOUND DEBRIDEMENT Bilateral 08/25/2020   Procedure: DEBRIDEMENT WOUND BILATERAL FEET;  Surgeon: Edrick Kins, DPM;  Location: [place];  Service: Podiatry;  Laterality: Bilateral;     Family History  Problem Relation Age of Onset   Pneumonia Mother    Heart attack Father    Diabetes Sister      Social History  Socioeconomic History   Marital status: Married    Spouse name: Not on file   Number of children: Not on file   Years of education: Not on file   Highest education level: Not on file  Occupational History   Not on file  Tobacco Use   Smoking status: Never   Smokeless tobacco: Never  Vaping Use   Vaping Use: Never used  Substance and Sexual Activity   Alcohol use: No   Drug use: Never   Sexual activity: Yes    Birth control/protection: Surgical  Other Topics Concern   Not on file  Social History Narrative   Not on file   Social Determinants of Health   Financial Resource Strain: Not on file  Food Insecurity: Not on file  Transportation Needs: Not on file  Physical Activity: Not on file  Stress: Not on file  Social Connections: Not on file  Intimate Partner Violence: Not on file     BP 138/62   Pulse 76   Ht 5\' 2"  (1.575 m)   Wt 143 lb 6.4 oz (65 kg)   SpO2 96%   BMI 26.23 kg/m   Physical Exam:  Well appearing NAD HEENT: Unremarkable Neck:  No JVD, no thyromegally Lymphatics:  No adenopathy Back:  No CVA tenderness Lungs:  Clear with no wheezes HEART:  Regular rate rhythm, no murmurs, no rubs, no clicks Abd:  soft, positive bowel sounds, no organomegally, no rebound, no guarding Ext:  2 plus pulses, no edema, no cyanosis, no clubbing Skin:  No  rashes no nodules Neuro:  CN II through XII intact, motor grossly intact  EKG atrial fib with ventricular pacing  DEVICE  Normal device function.  See PaceArt for details. Her atrial lead in the His bundle area is non-capture at 5 volts.  Assess/Plan:  CHB - she is asymptomatic, s/p PPM insertion. PPM - her medtronic DDD PM is working normally but she is reprogrammed VVIR 70.  Falls - she has not had any since she broke her hip. Chronic diastolic heart failure -her symptoms are class 2.   Carleene Overlie Mya Suell,MD

## 2020-09-28 ENCOUNTER — Ambulatory Visit (INDEPENDENT_AMBULATORY_CARE_PROVIDER_SITE_OTHER): Payer: Medicare (Managed Care) | Admitting: Podiatry

## 2020-09-28 ENCOUNTER — Telehealth: Payer: Self-pay | Admitting: Podiatry

## 2020-09-28 DIAGNOSIS — E08621 Diabetes mellitus due to underlying condition with foot ulcer: Secondary | ICD-10-CM | POA: Diagnosis not present

## 2020-09-28 DIAGNOSIS — E119 Type 2 diabetes mellitus without complications: Secondary | ICD-10-CM

## 2020-09-28 DIAGNOSIS — L97412 Non-pressure chronic ulcer of right heel and midfoot with fat layer exposed: Secondary | ICD-10-CM

## 2020-09-28 NOTE — Progress Notes (Signed)
   Subjective:  Patient presents today status post excisional debridement of bilateral heels with application of Acell wound graft. DOS: 08/25/2020.  Patient tolerated the graft application well.  She has kept the dressings clean dry and intact for the past week.  She presents with her daughter for follow-up treatment and evaluation  Past Medical History:  Diagnosis Date   Arthritis    hands and back, back spinal stenosis   AV node arrhythmia    a. s/p AV node ablation/PPM 07/2017.   Chronic deafness in right ear    Chronic diastolic CHF (congestive heart failure) ([place])    a. Acute exacerbation occurred in setting of AF.   CKD (chronic kidney disease), stage III ([place])    DM type 2 (diabetes mellitus, type 2) ([place])    Dyspnea    with exertion   Erythropoietin deficiency anemia 08/28/2018   Hyperlipidemia    Hypertension    Hypothyroidism    Lower extremity edema    props up when can swelling then goes down (08-22-2020)   Osteoporosis    Persistent atrial fibrillation ([place])    a. diagnosed in 10/2014 by PCP, underwent failed TEE DCCV on 10/08/14, loaded with amio, successfully DCCV on 10/15/14.   Presence of permanent cardiac pacemaker    Rotator cuff tear since april 2022   Uses walker    Wears dentures    full set    Objective/Physical Exam Neurovascular status intact.  The wound to the left posterior heel has completely healed.  Complete reepithelialization has occurred.  Wound noted to the RT heel appears mostly unchanged from prior visit approximately 4.5 x 2.0 x 0.5 cm.  To the noted ulceration, there is a moderate amount of slough fibrin and necrotic tissue noted.  Granulation tissue and wound base is red.  There is intermittent fibrotic tissue.  There is no exposed bone muscle tendon ligament or joint.  No malodor noted.  Moderate serous drainage noted.  Periwound is intact  Assessment: 1. s/p excisional debridement of bilateral heels with application of wound graft. DOS:  08/25/2020 2.  Ulcer right posterior heel secondary to diabetes mellitus   Plan of Care:  1. Patient was evaluated.  2.  Patient has completed the oral antibiotics as prescribed. 3.  Excisional debridement of the necrotic tissue was performed using a tissue nipper down to healthier bleeding viable tissue 4.  Application of 25 cm of PuraPlyXT fenestrated wound matrix applied to the posterior heel wound to a total wound measurement area of 9.0 cm.  Please note this graft is medically necessary.  The patient has failed all conservative treatment including routine debridements with minimal improvement of the wound. 5.  Dressings applied 6.  Return to clinic in 1 week for reapplication of graft  PuraPly XT Antimicrobial Fenestrated [place].  LOT [id number] EXP 2021-07-25  Edrick Kins, DPM [facility]  Dr. Edrick Kins, DPM    [address], [place] [address]                Office [phone]  Fax [phone]

## 2020-10-05 ENCOUNTER — Ambulatory Visit (INDEPENDENT_AMBULATORY_CARE_PROVIDER_SITE_OTHER): Payer: Medicare (Managed Care) | Admitting: Podiatry

## 2020-10-05 ENCOUNTER — Encounter: Payer: Medicare (Managed Care) | Admitting: Podiatry

## 2020-10-05 ENCOUNTER — Other Ambulatory Visit: Payer: Self-pay

## 2020-10-05 DIAGNOSIS — L97412 Non-pressure chronic ulcer of right heel and midfoot with fat layer exposed: Secondary | ICD-10-CM

## 2020-10-05 DIAGNOSIS — E08621 Diabetes mellitus due to underlying condition with foot ulcer: Secondary | ICD-10-CM

## 2020-10-05 DIAGNOSIS — L97402 Non-pressure chronic ulcer of unspecified heel and midfoot with fat layer exposed: Secondary | ICD-10-CM

## 2020-10-05 NOTE — Progress Notes (Signed)
   Subjective:  Patient presents today status post excisional debridement of bilateral heels with application of Acell wound graft. DOS: 08/25/2020.  Patient tolerated the graft application well.  She has kept the dressings clean dry and intact for the past week.  She presents with her daughter for follow-up treatment and evaluation  Past Medical History:  Diagnosis Date   Arthritis    hands and back, back spinal stenosis   AV node arrhythmia    a. s/p AV node ablation/PPM 07/2017.   Chronic deafness in right ear    Chronic diastolic CHF (congestive heart failure) ([place])    a. Acute exacerbation occurred in setting of AF.   CKD (chronic kidney disease), stage III ([place])    DM type 2 (diabetes mellitus, type 2) ([place])    Dyspnea    with exertion   Erythropoietin deficiency anemia 08/28/2018   Hyperlipidemia    Hypertension    Hypothyroidism    Lower extremity edema    props up when can swelling then goes down (08-22-2020)   Osteoporosis    Persistent atrial fibrillation ([place])    a. diagnosed in 10/2014 by PCP, underwent failed TEE DCCV on 10/08/14, loaded with amio, successfully DCCV on 10/15/14.   Presence of permanent cardiac pacemaker    Rotator cuff tear since april 2022   Uses walker    Wears dentures    full set    Objective/Physical Exam Neurovascular status intact.  The wound to the left posterior heel has completely healed.  Complete reepithelialization has occurred.  Wound noted to the RT heel appears mostly unchanged from prior visit approximately 4.5 x 2.0 x 0.5 cm.  To the noted ulceration, there is a moderate amount of slough fibrin and necrotic tissue noted.  Granulation tissue and wound base is red.  There is intermittent fibrotic tissue.  There is no exposed bone muscle tendon ligament or joint.  No malodor noted.  Moderate serous drainage noted.  Periwound is intact  Assessment: 1. s/p excisional debridement of bilateral heels with application of wound graft. DOS:  08/25/2020 2.  Ulcer right posterior heel secondary to diabetes mellitus   Plan of Care:  1. Patient was evaluated.  2.  Patient has completed the oral antibiotics as prescribed. 3.  Excisional debridement of the necrotic tissue was performed using a tissue nipper down to healthier bleeding viable tissue with postdebridement measurement same as pre- 4.  Return to clinic in 1 week for reapplication of graft    Edrick Kins, DPM [facility]  Dr. Edrick Kins, DPM    [address], [place] [address]                Office [phone]  Fax [phone]

## 2020-10-12 ENCOUNTER — Ambulatory Visit (INDEPENDENT_AMBULATORY_CARE_PROVIDER_SITE_OTHER): Payer: Medicare (Managed Care) | Admitting: Podiatry

## 2020-10-12 ENCOUNTER — Other Ambulatory Visit: Payer: Self-pay

## 2020-10-12 DIAGNOSIS — L97412 Non-pressure chronic ulcer of right heel and midfoot with fat layer exposed: Secondary | ICD-10-CM

## 2020-10-12 DIAGNOSIS — E08621 Diabetes mellitus due to underlying condition with foot ulcer: Secondary | ICD-10-CM

## 2020-10-12 DIAGNOSIS — L97402 Non-pressure chronic ulcer of unspecified heel and midfoot with fat layer exposed: Secondary | ICD-10-CM | POA: Diagnosis not present

## 2020-10-12 NOTE — Progress Notes (Signed)
   Subjective:  Patient presents today status post excisional debridement of bilateral heels with application of Acell wound graft. DOS: 08/25/2020.  Patient tolerated the graft application well.  She has kept the dressings clean dry and intact for the past week.  She presents with her daughter for follow-up treatment and evaluation  Past Medical History:  Diagnosis Date   Arthritis    hands and back, back spinal stenosis   AV node arrhythmia    a. s/p AV node ablation/PPM 07/2017.   Chronic deafness in right ear    Chronic diastolic CHF (congestive heart failure) ([place])    a. Acute exacerbation occurred in setting of AF.   CKD (chronic kidney disease), stage III ([place])    DM type 2 (diabetes mellitus, type 2) ([place])    Dyspnea    with exertion   Erythropoietin deficiency anemia 08/28/2018   Hyperlipidemia    Hypertension    Hypothyroidism    Lower extremity edema    props up when can swelling then goes down (08-22-2020)   Osteoporosis    Persistent atrial fibrillation ([place])    a. diagnosed in 10/2014 by PCP, underwent failed TEE DCCV on 10/08/14, loaded with amio, successfully DCCV on 10/15/14.   Presence of permanent cardiac pacemaker    Rotator cuff tear since april 2022   Uses walker    Wears dentures    full set    Objective/Physical Exam Neurovascular status intact.  The wound to the left posterior heel has completely healed.  Complete reepithelialization has occurred.  Wound noted to the RT heel appears mostly unchanged from prior visit approximately 4.5 x 1.5 x 0.4 cm.  To the noted ulceration, there is a moderate amount of slough fibrin and necrotic tissue noted.  Granulation tissue and wound base is red.  There is intermittent fibrotic tissue.  There is no exposed bone muscle tendon ligament or joint.  No malodor noted.  Moderate serous drainage noted.  Periwound is intact  Assessment: 1. s/p excisional debridement of bilateral heels with application of wound graft. DOS:  08/25/2020 2.  Ulcer right posterior heel secondary to diabetes mellitus   Plan of Care:  1. Patient was evaluated.  2.  Patient has completed the oral antibiotics as prescribed. 3.  Excisional debridement of the necrotic tissue was performed using a tissue nipper down to healthier bleeding viable tissue 4.  Application of 25 cm of PuraPlyXT fenestrated wound matrix applied to the posterior heel wound to a total wound measurement area of 9.0 cm.  Please note this graft is medically necessary.  The patient has failed all conservative treatment including routine debridements with minimal improvement of the wound. 5.  Dressings applied 6.  Return to clinic in 1 week for reapplication of graft  PuraPly XT Antimicrobial Fenestrated [place].  LOT [id number] EXP 2021-07-31  Edrick Kins, DPM [facility]  Dr. Edrick Kins, DPM    [address], [place] [address]                Office [phone]  Fax [phone]

## 2020-10-17 ENCOUNTER — Encounter: Payer: Medicare (Managed Care) | Admitting: Podiatry

## 2020-10-19 ENCOUNTER — Other Ambulatory Visit: Payer: Self-pay

## 2020-10-19 ENCOUNTER — Ambulatory Visit (INDEPENDENT_AMBULATORY_CARE_PROVIDER_SITE_OTHER): Payer: Medicare (Managed Care) | Admitting: Podiatry

## 2020-10-19 DIAGNOSIS — L97402 Non-pressure chronic ulcer of unspecified heel and midfoot with fat layer exposed: Secondary | ICD-10-CM

## 2020-10-19 DIAGNOSIS — E08621 Diabetes mellitus due to underlying condition with foot ulcer: Secondary | ICD-10-CM | POA: Diagnosis not present

## 2020-10-19 NOTE — Progress Notes (Signed)
   Subjective:  Patient presents today status post excisional debridement of bilateral heels with application of Acell wound graft. DOS: 08/25/2020.  Patient tolerated the graft application well.  She has kept the dressings clean dry and intact for the past week.  She presents with her daughter for follow-up treatment and evaluation  Past Medical History:  Diagnosis Date   Arthritis    hands and back, back spinal stenosis   AV node arrhythmia    a. s/p AV node ablation/PPM 07/2017.   Chronic deafness in right ear    Chronic diastolic CHF (congestive heart failure) ([place])    a. Acute exacerbation occurred in setting of AF.   CKD (chronic kidney disease), stage III ([place])    DM type 2 (diabetes mellitus, type 2) ([place])    Dyspnea    with exertion   Erythropoietin deficiency anemia 08/28/2018   Hyperlipidemia    Hypertension    Hypothyroidism    Lower extremity edema    props up when can swelling then goes down (08-22-2020)   Osteoporosis    Persistent atrial fibrillation ([place])    a. diagnosed in 10/2014 by PCP, underwent failed TEE DCCV on 10/08/14, loaded with amio, successfully DCCV on 10/15/14.   Presence of permanent cardiac pacemaker    Rotator cuff tear since april 2022   Uses walker    Wears dentures    full set    Objective/Physical Exam Neurovascular status intact.  The wound to the left posterior heel has completely healed.  Complete reepithelialization has occurred.  Wound noted to the RT heel appears mostly unchanged from prior visit approximately 4.5 x 1.5 x 0.3 cm.  To the noted ulceration, there is a moderate amount of slough fibrin and necrotic tissue noted.  Granulation tissue and wound base is red.  There is intermittent fibrotic tissue.  There is no exposed bone muscle tendon ligament or joint.  No malodor noted.  Moderate serous drainage noted.  Periwound is intact  Assessment: 1. s/p excisional debridement of bilateral heels with application of wound graft. DOS:  08/25/2020 2.  Ulcer right posterior heel secondary to diabetes mellitus   Plan of Care:  1. Patient was evaluated.  2.  Patient has completed the oral antibiotics as prescribed. 3.  Excisional debridement of the necrotic tissue was performed using a tissue nipper down to healthier bleeding viable tissue 4.  Application of 25 cm of PuraPlyXT fenestrated wound matrix applied to the posterior heel wound to a total wound measurement area of 9.0 cm.  Please note this graft is medically necessary.  The patient has failed all conservative treatment including routine debridements with minimal improvement of the wound. 5.  Dressings applied 6.  Return to clinic in 1 week for reapplication of graft  PuraPly XT Antimicrobial Fenestrated [place].  LOT [id number] EXP 2021-08-15  Edrick Kins, DPM [facility]  Dr. Edrick Kins, DPM    [address], [place] [address]                Office [phone]  Fax [phone]

## 2020-10-20 ENCOUNTER — Other Ambulatory Visit: Payer: Self-pay | Admitting: Vascular Surgery

## 2020-10-20 ENCOUNTER — Ambulatory Visit
Admission: RE | Admit: 2020-10-20 | Discharge: 2020-10-20 | Disposition: A | Discharge status: Home / Self Care | Payer: Medicare (Managed Care) | Source: Ambulatory Visit | Attending: Vascular Surgery | Admitting: Vascular Surgery

## 2020-10-20 ENCOUNTER — Other Ambulatory Visit: Payer: Self-pay | Admitting: Family Medicine

## 2020-10-20 DIAGNOSIS — W19XXXA Unspecified fall, initial encounter: Secondary | ICD-10-CM

## 2020-10-26 ENCOUNTER — Other Ambulatory Visit: Payer: Self-pay

## 2020-10-26 ENCOUNTER — Ambulatory Visit (INDEPENDENT_AMBULATORY_CARE_PROVIDER_SITE_OTHER): Payer: Medicare (Managed Care) | Admitting: Podiatry

## 2020-10-26 DIAGNOSIS — L97402 Non-pressure chronic ulcer of unspecified heel and midfoot with fat layer exposed: Secondary | ICD-10-CM

## 2020-10-26 DIAGNOSIS — E08621 Diabetes mellitus due to underlying condition with foot ulcer: Secondary | ICD-10-CM | POA: Diagnosis not present

## 2020-10-26 NOTE — Progress Notes (Signed)
   Subjective:  Patient presents today for follow-up evaluation of a right posterior heel ulcer secondary to diabetes mellitus.  We have been applying weekly wound grafts and she presents today for reapplication.  She presents for further treatment and evaluation  Past Medical History:  Diagnosis Date   Arthritis    hands and back, back spinal stenosis   AV node arrhythmia    a. s/p AV node ablation/PPM 07/2017.   Chronic deafness in right ear    Chronic diastolic CHF (congestive heart failure) ([place])    a. Acute exacerbation occurred in setting of AF.   CKD (chronic kidney disease), stage III ([place])    DM type 2 (diabetes mellitus, type 2) ([place])    Dyspnea    with exertion   Erythropoietin deficiency anemia 08/28/2018   Hyperlipidemia    Hypertension    Hypothyroidism    Lower extremity edema    props up when can swelling then goes down (08-22-2020)   Osteoporosis    Persistent atrial fibrillation ([place])    a. diagnosed in 10/2014 by PCP, underwent failed TEE DCCV on 10/08/14, loaded with amio, successfully DCCV on 10/15/14.   Presence of permanent cardiac pacemaker    Rotator cuff tear since april 2022   Uses walker    Wears dentures    full set    Objective/Physical Exam Neurovascular status intact.  The wound to the left posterior heel has completely healed.  Complete reepithelialization has occurred.  Wound noted to the RT heel appears mostly unchanged from prior visit approximately 4.3 x 1.3 x 0.3 cm.  To the noted ulceration, there is a moderate amount of slough fibrin and necrotic tissue noted.  Granulation tissue and wound base is red.  There is intermittent fibrotic tissue.  There is no exposed bone muscle tendon ligament or joint.  No malodor noted.  Moderate serous drainage noted.  Periwound is intact  Assessment: 1. s/p excisional debridement of bilateral heels with application of wound graft. DOS: 08/25/2020 2.  Ulcer right posterior heel secondary to diabetes  mellitus   Plan of Care:  1. Patient was evaluated.  2.  Patient has completed the oral antibiotics as prescribed. 3.  Excisional debridement of the necrotic tissue was performed using a tissue nipper down to healthier bleeding viable tissue 4.  Application of 25 cm of PuraPlyXT fenestrated wound matrix applied to the posterior heel wound to a total wound measurement area of 9.0 cm.  Please note this graft is medically necessary.  The patient has failed all conservative treatment including routine debridements with minimal improvement of the wound. 5.  Dressings applied 6.  Return to clinic in 1 week for reapplication of graft  PuraPly XT Antimicrobial Fenestrated [place].  LOT [id number] EXP 2021-08-15  Edrick Kins, DPM [facility]  Dr. Edrick Kins, DPM    [address], [place] [address]                Office [phone]  Fax [phone]

## 2020-10-31 ENCOUNTER — Other Ambulatory Visit: Payer: Self-pay

## 2020-10-31 ENCOUNTER — Ambulatory Visit (INDEPENDENT_AMBULATORY_CARE_PROVIDER_SITE_OTHER): Payer: Medicare (Managed Care) | Admitting: Podiatry

## 2020-10-31 DIAGNOSIS — E08621 Diabetes mellitus due to underlying condition with foot ulcer: Secondary | ICD-10-CM | POA: Diagnosis not present

## 2020-10-31 DIAGNOSIS — L97402 Non-pressure chronic ulcer of unspecified heel and midfoot with fat layer exposed: Secondary | ICD-10-CM | POA: Diagnosis not present

## 2020-10-31 NOTE — Progress Notes (Signed)
   Subjective:  Patient presents today for follow-up evaluation of a right posterior heel ulcer secondary to diabetes mellitus.  We have been applying weekly wound grafts and she presents today for reapplication.  She presents for further treatment and evaluation  Past Medical History:  Diagnosis Date   Arthritis    hands and back, back spinal stenosis   AV node arrhythmia    a. s/p AV node ablation/PPM 07/2017.   Chronic deafness in right ear    Chronic diastolic CHF (congestive heart failure) ([place])    a. Acute exacerbation occurred in setting of AF.   CKD (chronic kidney disease), stage III ([place])    DM type 2 (diabetes mellitus, type 2) ([place])    Dyspnea    with exertion   Erythropoietin deficiency anemia 08/28/2018   Hyperlipidemia    Hypertension    Hypothyroidism    Lower extremity edema    props up when can swelling then goes down (08-22-2020)   Osteoporosis    Persistent atrial fibrillation ([place])    a. diagnosed in 10/2014 by PCP, underwent failed TEE DCCV on 10/08/14, loaded with amio, successfully DCCV on 10/15/14.   Presence of permanent cardiac pacemaker    Rotator cuff tear since april 2022   Uses walker    Wears dentures    full set    Objective/Physical Exam Neurovascular status intact.  The wound to the left posterior heel has completely healed.  Complete reepithelialization has occurred.  Wound noted to the RT heel appears mostly unchanged from prior visit approximately 4.3 x 1.3 x 0.3 cm.  To the noted ulceration, there is a moderate amount of slough fibrin and necrotic tissue noted.  Granulation tissue and wound base is red.  There is intermittent fibrotic tissue.  There is no exposed bone muscle tendon ligament or joint.  No malodor noted.  Moderate serous drainage noted.  Periwound is intact  Assessment: 1. s/p excisional debridement of bilateral heels with application of wound graft. DOS: 08/25/2020 2.  Ulcer right posterior heel secondary to diabetes  mellitus   Plan of Care:  1. Patient was evaluated.  2.  Patient has completed the oral antibiotics as prescribed. 3.  Excisional debridement of the necrotic tissue was performed using a tissue nipper down to healthier bleeding viable tissue 4.  Application of 25 cm of PuraPlyXT fenestrated wound matrix applied to the posterior heel wound to a total wound measurement area of 9.0 cm.  Please note this graft is medically necessary.  The patient has failed all conservative treatment including routine debridements with minimal improvement of the wound. 5.  Dressings applied 6.  Return to clinic in 1 week for reapplication of graft  PuraPly XT Antimicrobial Fenestrated [place].  LOT [id number] EXP 2021-08-15  Edrick Kins, DPM [facility]  Dr. Edrick Kins, DPM    [address], [place] [address]                Office [phone]  Fax [phone]

## 2020-11-07 ENCOUNTER — Other Ambulatory Visit: Payer: Self-pay

## 2020-11-07 ENCOUNTER — Ambulatory Visit (INDEPENDENT_AMBULATORY_CARE_PROVIDER_SITE_OTHER): Payer: Medicare (Managed Care) | Admitting: Podiatry

## 2020-11-07 DIAGNOSIS — L97402 Non-pressure chronic ulcer of unspecified heel and midfoot with fat layer exposed: Secondary | ICD-10-CM | POA: Diagnosis not present

## 2020-11-07 DIAGNOSIS — E08621 Diabetes mellitus due to underlying condition with foot ulcer: Secondary | ICD-10-CM

## 2020-11-08 NOTE — Progress Notes (Signed)
   Subjective:  Patient presents today for follow-up evaluation of a right posterior heel ulcer secondary to diabetes mellitus.  We have been applying weekly wound grafts and she presents today for reapplication.  She presents for further treatment and evaluation  Past Medical History:  Diagnosis Date   Arthritis    hands and back, back spinal stenosis   AV node arrhythmia    a. s/p AV node ablation/PPM 07/2017.   Chronic deafness in right ear    Chronic diastolic CHF (congestive heart failure) ([place])    a. Acute exacerbation occurred in setting of AF.   CKD (chronic kidney disease), stage III ([place])    DM type 2 (diabetes mellitus, type 2) ([place])    Dyspnea    with exertion   Erythropoietin deficiency anemia 08/28/2018   Hyperlipidemia    Hypertension    Hypothyroidism    Lower extremity edema    props up when can swelling then goes down (08-22-2020)   Osteoporosis    Persistent atrial fibrillation ([place])    a. diagnosed in 10/2014 by PCP, underwent failed TEE DCCV on 10/08/14, loaded with amio, successfully DCCV on 10/15/14.   Presence of permanent cardiac pacemaker    Rotator cuff tear since april 2022   Uses walker    Wears dentures    full set    Objective/Physical Exam Neurovascular status intact.  The wound to the left posterior heel has completely healed.  Complete reepithelialization has occurred.  Wound noted to the RT heel appears mostly unchanged from prior visit approximately 4.2 x 1.1 x 0.3 cm.  To the noted ulceration, there is a moderate amount of slough fibrin and necrotic tissue noted.  Granulation tissue and wound base is red.  There is intermittent fibrotic tissue.  There is no exposed bone muscle tendon ligament or joint.  No malodor noted.  Moderate serous drainage noted.  Periwound is intact  Assessment: 1. s/p excisional debridement of bilateral heels with application of wound graft. DOS: 08/25/2020 2.  Ulcer right posterior heel secondary to diabetes  mellitus   Plan of Care:  1. Patient was evaluated.  2.  Patient has completed the oral antibiotics as prescribed. 3.  Excisional debridement of the necrotic tissue was performed using a tissue nipper down to healthier bleeding viable tissue 4.  Application of 25 cm of PuraPlyXT fenestrated wound matrix applied to the posterior heel wound to a total wound measurement area of 9.0 cm.  Please note this graft is medically necessary.  The patient has failed all conservative treatment including routine debridements with minimal improvement of the wound. 5.  Dressings applied 6.  Return to clinic in 1 week for reapplication of graft  PuraPly XT Antimicrobial Fenestrated [place].  LOT [id number] EXP 2021-08-20  Edrick Kins, DPM [facility]  Dr. Edrick Kins, DPM    [address], [place] [address]                Office [phone]  Fax [phone]

## 2020-11-16 ENCOUNTER — Ambulatory Visit (INDEPENDENT_AMBULATORY_CARE_PROVIDER_SITE_OTHER): Payer: Medicare (Managed Care) | Admitting: Podiatry

## 2020-11-16 ENCOUNTER — Other Ambulatory Visit: Payer: Self-pay

## 2020-11-16 DIAGNOSIS — E08621 Diabetes mellitus due to underlying condition with foot ulcer: Secondary | ICD-10-CM

## 2020-11-16 DIAGNOSIS — L97402 Non-pressure chronic ulcer of unspecified heel and midfoot with fat layer exposed: Secondary | ICD-10-CM

## 2020-11-16 NOTE — Progress Notes (Signed)
   Subjective:  Patient presents today for follow-up evaluation of a right posterior heel ulcer secondary to diabetes mellitus.  We have been applying weekly wound grafts and she presents today for reapplication.  She presents for further treatment and evaluation  Past Medical History:  Diagnosis Date   Arthritis    hands and back, back spinal stenosis   AV node arrhythmia    a. s/p AV node ablation/PPM 07/2017.   Chronic deafness in right ear    Chronic diastolic CHF (congestive heart failure) ([place])    a. Acute exacerbation occurred in setting of AF.   CKD (chronic kidney disease), stage III ([place])    DM type 2 (diabetes mellitus, type 2) ([place])    Dyspnea    with exertion   Erythropoietin deficiency anemia 08/28/2018   Hyperlipidemia    Hypertension    Hypothyroidism    Lower extremity edema    props up when can swelling then goes down (08-22-2020)   Osteoporosis    Persistent atrial fibrillation ([place])    a. diagnosed in 10/2014 by PCP, underwent failed TEE DCCV on 10/08/14, loaded with amio, successfully DCCV on 10/15/14.   Presence of permanent cardiac pacemaker    Rotator cuff tear since april 2022   Uses walker    Wears dentures    full set    Objective/Physical Exam Neurovascular status intact.  The wound to the left posterior heel has completely healed.  Complete reepithelialization has occurred.  Wound noted to the RT heel appears mostly unchanged from prior visit approximately 4.1 x 1.2 x 0.3 cm.  To the noted ulceration, there is a moderate amount of slough fibrin and necrotic tissue noted.  Granulation tissue and wound base is red.  There is intermittent fibrotic tissue.  There is no exposed bone muscle tendon ligament or joint.  No malodor noted.  Moderate serous drainage noted.  Periwound is intact  Assessment: 1. s/p excisional debridement of bilateral heels with application of wound graft. DOS: 08/25/2020 2.  Ulcer right posterior heel secondary to diabetes  mellitus   Plan of Care:  1. Patient was evaluated.  2.  Patient has completed the oral antibiotics as prescribed. 3.  Excisional debridement of the necrotic tissue was performed using a tissue nipper down to healthier bleeding viable tissue 4.  Application of 25 cm of PuraPlyXT fenestrated wound matrix applied to the posterior heel wound to a total wound measurement area of 9.0 cm.  Please note this graft is medically necessary.  The patient has failed all conservative treatment including routine debridements with minimal improvement of the wound. 5.  Dressings applied 6.  Return to clinic in 1 week for reapplication of graft  PuraPly XT Antimicrobial Fenestrated [place].  LOT [id number] EXP 2021-08-20  Edrick Kins, DPM [facility]  Dr. Edrick Kins, DPM    [address], [place] [address]                Office [phone]  Fax [phone]

## 2020-11-23 ENCOUNTER — Other Ambulatory Visit: Payer: Self-pay

## 2020-11-23 ENCOUNTER — Ambulatory Visit (INDEPENDENT_AMBULATORY_CARE_PROVIDER_SITE_OTHER): Payer: Medicare (Managed Care) | Admitting: Podiatry

## 2020-11-23 DIAGNOSIS — L97412 Non-pressure chronic ulcer of right heel and midfoot with fat layer exposed: Secondary | ICD-10-CM | POA: Diagnosis not present

## 2020-11-23 DIAGNOSIS — E08621 Diabetes mellitus due to underlying condition with foot ulcer: Secondary | ICD-10-CM

## 2020-11-23 DIAGNOSIS — L97402 Non-pressure chronic ulcer of unspecified heel and midfoot with fat layer exposed: Secondary | ICD-10-CM

## 2020-11-23 NOTE — Progress Notes (Signed)
   Subjective:  Patient presents today for follow-up evaluation of a right posterior heel ulcer secondary to diabetes mellitus.  We have been applying weekly wound grafts and she presents today for reapplication.  She presents for further treatment and evaluation  Past Medical History:  Diagnosis Date   Arthritis    hands and back, back spinal stenosis   AV node arrhythmia    a. s/p AV node ablation/PPM 07/2017.   Chronic deafness in right ear    Chronic diastolic CHF (congestive heart failure) ([place])    a. Acute exacerbation occurred in setting of AF.   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 diagnosed in 10/2014 by PCP, underwent failed TEE DCCV on 10/08/14, loaded with amio, successfully DCCV on 10/15/14.   Presence of permanent cardiac pacemaker    Rotator cuff tear since april 2022   Uses walker    Wears dentures    full set    Objective/Physical Exam Neurovascular status intact.  The wound to the left posterior heel has completely healed.  Complete reepithelialization has occurred.  Wound noted to the RT heel appears mostly unchanged from prior visit approximately 4.0 x 1.2 x 0.2 cm.  To the noted ulceration, there is a moderate amount of slough fibrin and necrotic tissue noted.  Granulation tissue and wound base is red.  There is intermittent fibrotic tissue.  There is no exposed bone muscle tendon ligament or joint.  No malodor noted.  Moderate serous drainage noted.  Periwound is intact  Assessment: 1. s/p excisional debridement of bilateral heels with application of wound graft. DOS: 08/25/2020 2.  Ulcer right posterior heel secondary to diabetes  mellitus   Plan of Care:  1. Patient was evaluated.  2.  Patient has completed the oral antibiotics as prescribed. 3.  Excisional debridement of the necrotic tissue was performed using a tissue nipper down to healthier bleeding viable tissue 4.  Application of 25 cm of PuraPlyXT fenestrated wound matrix applied to the posterior heel wound to a total wound measurement area of 9.0 cm.  Please note this graft is medically necessary.  The patient has failed all conservative treatment including routine debridements with minimal improvement of the wound. 5.  Dressings applied 6.  Return to clinic in 1 week for reapplication of graft  PuraPly XT Antimicrobial Fenestrated Wound [place].  LOT [id number] EXP 2021-08-29  Edrick Kins, DPM [facility]  Dr. Edrick Kins, DPM    [address], [place] [address]                Office [phone]  Fax [phone]

## 2020-11-25 ENCOUNTER — Telehealth: Payer: Self-pay | Admitting: *Deleted

## 2020-11-25 NOTE — Telephone Encounter (Signed)
error 

## 2020-11-28 NOTE — Telephone Encounter (Signed)
Error message

## 2020-11-29 ENCOUNTER — Ambulatory Visit (INDEPENDENT_AMBULATORY_CARE_PROVIDER_SITE_OTHER): Payer: Medicare (Managed Care)

## 2020-11-29 DIAGNOSIS — I442 Atrioventricular block, complete: Secondary | ICD-10-CM

## 2020-11-29 LAB — CUP PACEART REMOTE DEVICE CHECK
Battery Remaining Longevity: 104 mo
Battery Voltage: 3 V
Brady Statistic AP VP Percent: 0 %
Brady Statistic AP VS Percent: 0 %
Brady Statistic AS VP Percent: 99.93 %
Brady Statistic AS VS Percent: 0.07 %
Brady Statistic RA Percent Paced: 100 %
Brady Statistic RV Percent Paced: 99.93 %
Date Time Interrogation Session: 20220830003710
Implantable Lead Implant Date: 20190524
Implantable Lead Implant Date: 20190524
Implantable Lead Location: 753859
Implantable Lead Location: 753860
Implantable Lead Model: 3830
Implantable Lead Model: 5076
Implantable Pulse Generator Implant Date: 20190524
Lead Channel Impedance Value: 399 Ohm
Lead Channel Impedance Value: 437 Ohm
Lead Channel Impedance Value: 513 Ohm
Lead Channel Impedance Value: 551 Ohm
Lead Channel Pacing Threshold Amplitude: 0.75 V
Lead Channel Pacing Threshold Pulse Width: 0.4 ms
Lead Channel Sensing Intrinsic Amplitude: 1.5 mV
Lead Channel Sensing Intrinsic Amplitude: 3.25 mV
Lead Channel Sensing Intrinsic Amplitude: 6 mV
Lead Channel Setting Pacing Amplitude: 2.5 V
Lead Channel Setting Pacing Pulse Width: 0.4 ms
Lead Channel Setting Sensing Sensitivity: 4 mV

## 2020-11-30 ENCOUNTER — Other Ambulatory Visit: Payer: Self-pay

## 2020-11-30 ENCOUNTER — Ambulatory Visit (INDEPENDENT_AMBULATORY_CARE_PROVIDER_SITE_OTHER): Payer: Medicare (Managed Care) | Admitting: Podiatry

## 2020-11-30 DIAGNOSIS — L97402 Non-pressure chronic ulcer of unspecified heel and midfoot with fat layer exposed: Secondary | ICD-10-CM

## 2020-11-30 DIAGNOSIS — E08621 Diabetes mellitus due to underlying condition with foot ulcer: Secondary | ICD-10-CM | POA: Diagnosis not present

## 2020-11-30 NOTE — Progress Notes (Signed)
   Subjective:  Patient presents today for follow-up evaluation of a right posterior heel ulcer secondary to diabetes mellitus.  We have been applying weekly wound grafts and she presents today for reapplication.  Overall there has been some good improvement and good healing of the wound.  She presents for further treatment and evaluation  Past Medical History:  Diagnosis Date   Arthritis    hands and back, back spinal stenosis   AV node arrhythmia    a. s/p AV node ablation/PPM 07/2017.   Chronic deafness in right ear    Chronic diastolic CHF (congestive heart failure) ([place])    a. Acute exacerbation occurred in setting of AF.   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 diagnosed in 10/2014 by PCP, underwent failed TEE DCCV on 10/08/14, loaded with amio, successfully DCCV on 10/15/14.   Presence of permanent cardiac pacemaker    Rotator cuff tear since april 2022   Uses walker    Wears dentures    full set    Objective/Physical Exam Neurovascular status intact.  The wound to the left posterior heel has completely healed.  Complete reepithelialization has occurred.  Wound noted to the RT heel appears mostly unchanged from prior visit approximately 3.5x1.3 x 0.2 cm.  To the noted ulceration, there is a moderate amount of slough fibrin and necrotic tissue noted.  Granulation tissue and wound base is red.  There is intermittent fibrotic tissue.  There is no exposed bone muscle tendon ligament or joint.  No malodor noted.  Moderate serous drainage noted.  Periwound is intact  Assessment: 1. s/p excisional debridement of bilateral heels with application of wound graft. DOS:  08/25/2020 2.  Ulcer right posterior heel secondary to diabetes mellitus   Plan of Care:  1. Patient was evaluated.  2.  Patient has completed the oral antibiotics as prescribed. 3.  Excisional debridement of the necrotic tissue was performed using a tissue nipper down to healthier bleeding viable tissue 4.  Application of 25 cm of PuraPlyXT fenestrated wound matrix applied to the posterior heel wound to a total wound measurement area of 9.0 cm.  Please note this graft is medically necessary.  The patient has failed all conservative treatment including routine debridements with minimal improvement of the wound. 5.  Dressings applied 6.  Return to clinic in 1 week for reapplication of graft  PuraPly XT Antimicrobial Fenestrated [place].  LOT [id number] EXP 2021-08-29  Edrick Kins, DPM [facility]  Dr. Edrick Kins, DPM    [address], [place] [address]                Office [phone]  Fax [phone]

## 2020-12-07 ENCOUNTER — Ambulatory Visit (INDEPENDENT_AMBULATORY_CARE_PROVIDER_SITE_OTHER): Payer: Medicare (Managed Care) | Admitting: Podiatry

## 2020-12-07 ENCOUNTER — Other Ambulatory Visit: Payer: Self-pay

## 2020-12-07 DIAGNOSIS — E08621 Diabetes mellitus due to underlying condition with foot ulcer: Secondary | ICD-10-CM

## 2020-12-07 DIAGNOSIS — L97402 Non-pressure chronic ulcer of unspecified heel and midfoot with fat layer exposed: Secondary | ICD-10-CM | POA: Diagnosis not present

## 2020-12-12 NOTE — Progress Notes (Signed)
Remote pacemaker transmission.   

## 2020-12-14 ENCOUNTER — Other Ambulatory Visit: Payer: Self-pay

## 2020-12-14 ENCOUNTER — Ambulatory Visit (INDEPENDENT_AMBULATORY_CARE_PROVIDER_SITE_OTHER): Payer: Medicare (Managed Care) | Admitting: Podiatry

## 2020-12-14 DIAGNOSIS — L97402 Non-pressure chronic ulcer of unspecified heel and midfoot with fat layer exposed: Secondary | ICD-10-CM | POA: Diagnosis not present

## 2020-12-14 DIAGNOSIS — E08621 Diabetes mellitus due to underlying condition with foot ulcer: Secondary | ICD-10-CM | POA: Diagnosis not present

## 2020-12-14 NOTE — Progress Notes (Signed)
   Subjective:  Patient presents today for follow-up evaluation of a right posterior heel ulcer secondary to diabetes mellitus.  We have been applying weekly wound grafts and she presents today for reapplication.  Overall there has been some good improvement and good healing of the wound.  She presents for further treatment and evaluation  Past Medical History:  Diagnosis Date   Arthritis    hands and back, back spinal stenosis   AV node arrhythmia    a. s/p AV node ablation/PPM 07/2017.   Chronic deafness in right ear    Chronic diastolic CHF (congestive heart failure) ([place])    a. Acute exacerbation occurred in setting of AF.   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 diagnosed in 10/2014 by PCP, underwent failed TEE DCCV on 10/08/14, loaded with amio, successfully DCCV on 10/15/14.   Presence of permanent cardiac pacemaker    Rotator cuff tear since april 2022   Uses walker    Wears dentures    full set    Objective/Physical Exam Neurovascular status intact.  The wound to the left posterior heel has completely healed.  Complete reepithelialization has occurred.  Wound noted to the RT heel appears mostly unchanged from prior visit approximately 3.3 x 1.2 x 0.2 cm.  To the noted ulceration, there is a moderate amount of slough fibrin and necrotic tissue noted.  Granulation tissue and wound base is red.  There is intermittent fibrotic tissue.  There is no exposed bone muscle tendon ligament or joint.  No malodor noted.  Moderate serous drainage noted.  Periwound is intact  Assessment: 1. s/p excisional debridement of bilateral heels with application of wound graft. DOS:  08/25/2020 2.  Ulcer right posterior heel secondary to diabetes mellitus   Plan of Care:  1. Patient was evaluated.  2.  Patient has completed the oral antibiotics as prescribed. 3.  Excisional debridement of the necrotic tissue was performed using a tissue nipper down to healthier bleeding viable tissue 4.  Application of 25 cm of PuraPlyXT fenestrated wound matrix applied to the posterior heel wound to a total wound measurement area of 9.0 cm.  Please note this graft is medically necessary.  The patient has failed all conservative treatment including routine debridements with minimal improvement of the wound. 5.  Dressings applied 6.  Return to clinic in 1 week for reapplication of graft  PuraPly XT Antimicrobial Fenestrated Wound [employer]  LOT [id number] EXP 2021-09-03  Edrick Kins, DPM [facility]  Dr. Edrick Kins, DPM    [address],  [address]                Office [phone]  Fax [phone]

## 2020-12-19 NOTE — Progress Notes (Signed)
   Subjective:  Patient presents today for follow-up evaluation of a right posterior heel ulcer secondary to diabetes mellitus.  We have been applying weekly wound grafts and she presents today for reapplication.  Overall there has been some good improvement and good healing of the wound.  She presents for further treatment and evaluation  Past Medical History:  Diagnosis Date   Arthritis    hands and back, back spinal stenosis   AV node arrhythmia    a. s/p AV node ablation/PPM 07/2017.   Chronic deafness in right ear    Chronic diastolic CHF (congestive heart failure) ([place])    a. Acute exacerbation occurred in setting of AF.   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 diagnosed in 10/2014 by PCP, underwent failed TEE DCCV on 10/08/14, loaded with amio, successfully DCCV on 10/15/14.   Presence of permanent cardiac pacemaker    Rotator cuff tear since april 2022   Uses walker    Wears dentures    full set    Objective/Physical Exam Neurovascular status intact.  The wound to the left posterior heel has completely healed.  Complete reepithelialization has occurred.  Wound noted to the RT heel appears mostly unchanged from prior visit approximately 2.8x1.0 x 0.2 cm.  To the noted ulceration, there is a moderate amount of slough fibrin and necrotic tissue noted.  Granulation tissue and wound base is red.  There is intermittent fibrotic tissue.  There is no exposed bone muscle tendon ligament or joint.  No malodor noted.  Moderate serous drainage noted.  Periwound is intact  Assessment: 1. s/p excisional debridement of bilateral heels with application of wound graft. DOS:  08/25/2020 2.  Ulcer right posterior heel secondary to diabetes mellitus   Plan of Care:  1. Patient was evaluated.  Overall there continues to be good study improvement of the wound 2.  Patient has completed the oral antibiotics as prescribed. 3.  Excisional debridement of the necrotic tissue was performed using a tissue nipper down to healthier bleeding viable tissue 4.  Application of 25 cm of PuraPlyXT fenestrated wound matrix applied to the posterior heel wound to a total wound measurement area of 9.0 cm.  Please note this graft is medically necessary.  The patient has failed all conservative treatment including routine debridements with minimal improvement of the wound. 5.  Dressings applied 6.  Return to clinic in 1 week for reapplication of graft  PuraPly XT Antimicrobial Fenestrated [place].  LOT [id number] EXP 2021-08-29  Edrick Kins, DPM [facility]  Dr. Edrick Kins, DPM    [address], [place] [address]                Office [phone]  Fax [phone]

## 2020-12-21 ENCOUNTER — Ambulatory Visit (INDEPENDENT_AMBULATORY_CARE_PROVIDER_SITE_OTHER): Payer: Medicare (Managed Care) | Admitting: Podiatry

## 2020-12-21 ENCOUNTER — Other Ambulatory Visit: Payer: Self-pay

## 2020-12-21 DIAGNOSIS — E08621 Diabetes mellitus due to underlying condition with foot ulcer: Secondary | ICD-10-CM

## 2020-12-21 DIAGNOSIS — L97402 Non-pressure chronic ulcer of unspecified heel and midfoot with fat layer exposed: Secondary | ICD-10-CM | POA: Diagnosis not present

## 2020-12-21 NOTE — Progress Notes (Signed)
   Subjective:  Patient presents today for follow-up evaluation of a right posterior heel ulcer secondary to diabetes mellitus.  We have been applying weekly wound grafts and she presents today for reapplication.  Overall there has been some good improvement and good healing of the wound.  She presents for further treatment and evaluation  Past Medical History:  Diagnosis Date   Arthritis    hands and back, back spinal stenosis   AV node arrhythmia    a. s/p AV node ablation/PPM 07/2017.   Chronic deafness in right ear    Chronic diastolic CHF (congestive heart failure) ([place])    a. Acute exacerbation occurred in setting of AF.   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 diagnosed in 10/2014 by PCP, underwent failed TEE DCCV on 10/08/14, loaded with amio, successfully DCCV on 10/15/14.   Presence of permanent cardiac pacemaker    Rotator cuff tear since april 2022   Uses walker    Wears dentures    full set    Objective/Physical Exam Neurovascular status intact.  The wound to the left posterior heel has completely healed.  Complete reepithelialization has occurred.  Wound noted to the RT heel appears mostly unchanged from prior visit approximately 3.2 x 1.2 x 0.2 cm.  To the noted ulceration, there is a moderate amount of slough fibrin and necrotic tissue noted.  Granulation tissue and wound base is red.  There is intermittent fibrotic tissue.  There is no exposed bone muscle tendon ligament or joint.  No malodor noted.  Moderate serous drainage noted.  Periwound is intact  Assessment: 1. s/p excisional debridement of bilateral heels with application of wound graft. DOS:  08/25/2020 2.  Ulcer right posterior heel secondary to diabetes mellitus   Plan of Care:  1. Patient was evaluated.  2.  Patient has completed the oral antibiotics as prescribed. 3.  Excisional debridement of the necrotic tissue was performed using a tissue nipper down to healthier bleeding viable tissue 4.  Application of 25 cm of PuraPlyXT fenestrated wound matrix applied to the posterior heel wound to a total wound measurement area of 9.0 cm.  Please note this graft is medically necessary.  The patient has failed all conservative treatment including routine debridements with minimal improvement of the wound. 5.  Dressings applied 6.  Return to clinic in 1 week for reapplication of graft  PuraPly XT Antimicrobial Fenestrated [place].  LOT [id number] EXP 2021-09-03  Edrick Kins, DPM [facility]  Dr. Edrick Kins, DPM    [address], [place] [address]                Office [phone]  Fax [phone]

## 2020-12-28 ENCOUNTER — Other Ambulatory Visit: Payer: Self-pay

## 2020-12-28 ENCOUNTER — Ambulatory Visit (INDEPENDENT_AMBULATORY_CARE_PROVIDER_SITE_OTHER): Payer: Medicare (Managed Care) | Admitting: Podiatry

## 2020-12-28 DIAGNOSIS — E08621 Diabetes mellitus due to underlying condition with foot ulcer: Secondary | ICD-10-CM

## 2020-12-28 DIAGNOSIS — L97402 Non-pressure chronic ulcer of unspecified heel and midfoot with fat layer exposed: Secondary | ICD-10-CM

## 2020-12-28 NOTE — Progress Notes (Signed)
  Subjective:  Patient ID: Ann Fletcher, female    DOB: 07/21/1932,  MRN: [id number]  Chief Complaint  Patient presents with   Diabetic Ulcer    BILATERAL HEEL ULCERS W/APPLICATION OF GRAFT     87 y.o. female presents with the above complaint. History confirmed with patient.  She had a chronic ulceration of the posterior heel for some time.  Dr. Amalia Hailey has been applying wound grafts and she presents for the same today.  Objective:  Physical Exam: warm, good capillary refill, no trophic changes or ulcerative lesions, and normal sensory exam.  Posterior ulcer without signs of infection with a fiber granular wound bed measuring 2.7 x0.7 x 0.2 cm postdebridement     Assessment:  No diagnosis found.   Plan:  Patient was evaluated and treated and all questions answered.  Ulcer right heel -We discussed the etiology and factors that are a part of the wound healing process.  We also discussed the risk of infection both soft tissue and osteomyelitis from open ulceration.  Discussed the risk of limb loss if this happens or worsens. -Puraply AM XT is used today to promote granulation tissue, inhibit bioburden and support wound healing.  3 cm2 is utilized and 22 cm2 is wasted today.  It is then affixed and wound dressings were applied consisting of Mepitel Steri-Strips gauze and Ace wrap.   Continue offloading with surgical shoe.  Reevaluation will take place in one week with Dr Amalia Hailey   Procedure: Excisional Debridement of Wound Rationale: Removal of non-viable soft tissue from the wound to promote healing.  Anesthesia: none Post-Debridement Wound Measurements:2.7 x0.7 x 0.2 cm Type of Debridement: Sharp Excisional Tissue Removed: Non-viable soft tissue Depth of Debridement: subcutaneous tissue. Technique: Sharp excisional debridement to bleeding, viable wound base.  Dressing: Dry, sterile, compression dressing. Disposition: Patient tolerated procedure well.        Return in about 1  week (around 01/04/2021) for wound care, Graft application to wound.

## 2021-01-04 ENCOUNTER — Other Ambulatory Visit: Payer: Self-pay

## 2021-01-04 ENCOUNTER — Ambulatory Visit (INDEPENDENT_AMBULATORY_CARE_PROVIDER_SITE_OTHER): Payer: Medicare (Managed Care) | Admitting: Podiatry

## 2021-01-04 DIAGNOSIS — E08621 Diabetes mellitus due to underlying condition with foot ulcer: Secondary | ICD-10-CM | POA: Diagnosis not present

## 2021-01-04 DIAGNOSIS — L97402 Non-pressure chronic ulcer of unspecified heel and midfoot with fat layer exposed: Secondary | ICD-10-CM

## 2021-01-04 NOTE — Progress Notes (Signed)
   Subjective:  Patient presents today for follow-up evaluation of a right posterior heel ulcer secondary to diabetes mellitus.  We have been applying weekly wound grafts and she presents today for reapplication.  Overall there has been some good improvement and good healing of the wound.  She presents for further treatment and evaluation  Past Medical History:  Diagnosis Date   Arthritis    hands and back, back spinal stenosis   AV node arrhythmia    a. s/p AV node ablation/PPM 07/2017.   Chronic deafness in right ear    Chronic diastolic CHF (congestive heart failure) ([place])    a. Acute exacerbation occurred in setting of AF.   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 diagnosed in 10/2014 by PCP, underwent failed TEE DCCV on 10/08/14, loaded with amio, successfully DCCV on 10/15/14.   Presence of permanent cardiac pacemaker    Rotator cuff tear since april 2022   Uses walker    Wears dentures    full set    Objective/Physical Exam Neurovascular status intact.  The wound to the left posterior heel has completely healed.  Complete reepithelialization has occurred.  Wound noted to the RT heel appears mostly unchanged from prior visit approximately 3.1x0.7 x 0.2 cm.  To the noted ulceration, there is a moderate amount of slough fibrin and necrotic tissue noted.  Granulation tissue and wound base is red.  There is intermittent fibrotic tissue.  There is no exposed bone muscle tendon ligament or joint.  No malodor noted.  Moderate serous drainage noted.  Periwound is intact  Assessment: 1. s/p excisional debridement of bilateral heels with application of wound graft. DOS:  08/25/2020 2.  Ulcer right posterior heel secondary to diabetes mellitus   Plan of Care:  1. Patient was evaluated.  2.  Patient has completed the oral antibiotics as prescribed. 3.  Excisional debridement of the necrotic tissue was performed using a tissue nipper down to healthier bleeding viable tissue 4.  Application of 25 cm of PuraPlyXT fenestrated wound matrix applied to the posterior heel wound to a total wound measurement area of 2.17 cm.  Please note this graft is medically necessary.  The patient has failed all conservative treatment including routine debridements with minimal improvement of the wound. 5.  Dressings applied 6.  Return to clinic in 1 week for reapplication of graft  PuraPly XT Antimicrobial Fenestrated [place].  LOT [id number] EXP 2021-09-03  Edrick Kins, DPM [facility]  Dr. Edrick Kins, DPM    [address], [place] [address]                Office [phone]  Fax [phone]

## 2021-01-11 ENCOUNTER — Encounter: Payer: Medicare (Managed Care) | Admitting: Podiatry

## 2021-01-18 ENCOUNTER — Encounter: Payer: Self-pay | Admitting: Hematology & Oncology

## 2021-01-18 ENCOUNTER — Ambulatory Visit (INDEPENDENT_AMBULATORY_CARE_PROVIDER_SITE_OTHER): Payer: Medicare (Managed Care) | Admitting: Podiatry

## 2021-01-18 ENCOUNTER — Other Ambulatory Visit: Payer: Self-pay

## 2021-01-18 DIAGNOSIS — L97402 Non-pressure chronic ulcer of unspecified heel and midfoot with fat layer exposed: Secondary | ICD-10-CM

## 2021-01-18 DIAGNOSIS — E08621 Diabetes mellitus due to underlying condition with foot ulcer: Secondary | ICD-10-CM

## 2021-01-18 NOTE — Progress Notes (Signed)
   Subjective:  Patient presents today for follow-up evaluation of a right posterior heel ulcer secondary to diabetes mellitus.  We have been applying weekly wound grafts and she presents today for reapplication.  Overall there has been some good improvement and good healing of the wound.  She presents for further treatment and evaluation  Past Medical History:  Diagnosis Date   Arthritis    hands and back, back spinal stenosis   AV node arrhythmia    a. s/p AV node ablation/PPM 07/2017.   Chronic deafness in right ear    Chronic diastolic CHF (congestive heart failure) ([place])    a. Acute exacerbation occurred in setting of AF.   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 diagnosed in 10/2014 by PCP, underwent failed TEE DCCV on 10/08/14, loaded with amio, successfully DCCV on 10/15/14.   Presence of permanent cardiac pacemaker    Rotator cuff tear since april 2022   Uses walker    Wears dentures    full set    Objective/Physical Exam Neurovascular status intact.  The wound to the left posterior heel has completely healed.  Complete reepithelialization has occurred.  Wound noted to the RT heel appears mostly unchanged from prior visit approximately 3.0x0.7 x 0.2 cm.  To the noted ulceration, there is a moderate amount of slough fibrin and necrotic tissue noted.  Granulation tissue and wound base is red.  There is intermittent fibrotic tissue.  There is no exposed bone muscle tendon ligament or joint.  No malodor noted.  Moderate serous drainage noted.  Periwound is intact  Assessment: 1. s/p excisional debridement of bilateral heels with application of wound graft. DOS:  08/25/2020 2.  Ulcer right posterior heel secondary to diabetes mellitus   Plan of Care:  1. Patient was evaluated.  2.  Medically necessary excisional debridement including subcutaneous tissue was performed using a tissue nipper.  Excisional debridement of all necrotic nonviable tissue down to healthier bleeding viable tissue was performed with postdebridement measurement same as pre- 3.  Medihoney applied.  Recommend Medihoney daily with a light dressing 4.  Return to clinic in 2 weeks  Edrick Kins, DPM [facility]  Dr. Edrick Kins, DPM    [address], [place] [address]                Office [phone]  Fax [phone]

## 2021-01-20 ENCOUNTER — Telehealth: Payer: Self-pay | Admitting: Podiatry

## 2021-01-20 NOTE — Telephone Encounter (Signed)
Ann Fletcher a Marine scientist from [facility] called to clarify her wound care orders , she wants to know if they can use the medi honey every 72 hours instead of everyday.    Please advise ....  Her call back number is [phone]

## 2021-01-20 NOTE — Telephone Encounter (Signed)
That's fine. Thanks, Dr. Moranda Billiot

## 2021-01-20 NOTE — Telephone Encounter (Signed)
I called stacy carpenter back to let her know that Dr.evans states that it would be fine to use the medi honey every 72 hours

## 2021-01-25 ENCOUNTER — Encounter: Payer: Medicare (Managed Care) | Admitting: Podiatry

## 2021-02-01 ENCOUNTER — Ambulatory Visit (INDEPENDENT_AMBULATORY_CARE_PROVIDER_SITE_OTHER): Payer: Medicare (Managed Care) | Admitting: Podiatry

## 2021-02-01 ENCOUNTER — Encounter: Payer: Self-pay | Admitting: Podiatry

## 2021-02-01 ENCOUNTER — Other Ambulatory Visit: Payer: Self-pay

## 2021-02-01 DIAGNOSIS — E039 Hypothyroidism, unspecified: Secondary | ICD-10-CM | POA: Insufficient documentation

## 2021-02-01 DIAGNOSIS — L97402 Non-pressure chronic ulcer of unspecified heel and midfoot with fat layer exposed: Secondary | ICD-10-CM

## 2021-02-01 DIAGNOSIS — E08621 Diabetes mellitus due to underlying condition with foot ulcer: Secondary | ICD-10-CM

## 2021-02-01 DIAGNOSIS — R269 Unspecified abnormalities of gait and mobility: Secondary | ICD-10-CM | POA: Insufficient documentation

## 2021-02-01 DIAGNOSIS — N1832 Chronic kidney disease, stage 3b: Secondary | ICD-10-CM | POA: Insufficient documentation

## 2021-02-01 DIAGNOSIS — Z0000 Encounter for general adult medical examination without abnormal findings: Secondary | ICD-10-CM | POA: Insufficient documentation

## 2021-02-01 DIAGNOSIS — E1165 Type 2 diabetes mellitus with hyperglycemia: Secondary | ICD-10-CM | POA: Insufficient documentation

## 2021-02-01 DIAGNOSIS — I700 Atherosclerosis of aorta: Secondary | ICD-10-CM | POA: Insufficient documentation

## 2021-02-01 DIAGNOSIS — M5416 Radiculopathy, lumbar region: Secondary | ICD-10-CM | POA: Insufficient documentation

## 2021-02-01 DIAGNOSIS — Z780 Asymptomatic menopausal state: Secondary | ICD-10-CM | POA: Insufficient documentation

## 2021-02-01 DIAGNOSIS — R42 Dizziness and giddiness: Secondary | ICD-10-CM | POA: Insufficient documentation

## 2021-02-01 NOTE — Progress Notes (Signed)
   Subjective:  Patient presents today for follow-up evaluation of a right posterior heel ulcer secondary to diabetes mellitus.  Patient has been doing very well.  We do have a history of applying wound grafts with good improvement however insurance approval and authorization with reimbursement has been pending so we are holding off on the grafts at the moment.  The patient has now been applying Medihoney daily.  She presents for follow-up treatment and evaluation  Past Medical History:  Diagnosis Date   Arthritis    hands and back, back spinal stenosis   AV node arrhythmia    a. s/p AV node ablation/PPM 07/2017.   Chronic deafness in right ear    Chronic diastolic CHF (congestive heart failure) ([place])    a. Acute exacerbation occurred in setting of AF.   CKD (chronic kidney disease), stage III ([place])    DM type 2 (diabetes mellitus, type 2) ([place])    Dyspnea    with exertion   Erythropoietin deficiency anemia 08/28/2018   Hyperlipidemia    Hypertension    Hypothyroidism    Lower extremity edema    props up when can swelling then goes down (08-22-2020)   Osteoporosis    Persistent atrial fibrillation ([place])    a. diagnosed in 10/2014 by PCP, underwent failed TEE DCCV on 10/08/14, loaded with amio, successfully DCCV on 10/15/14.   Presence of permanent cardiac pacemaker    Rotator cuff tear since april 2022   Uses walker    Wears dentures    full set    Objective/Physical Exam Neurovascular status intact.  The wound to the left posterior heel has completely healed.  Complete reepithelialization has occurred.  Wound noted to the RT heel appears mostly unchanged from prior visit approximately 3.0x0.7 x 0.2 cm.  To the noted ulceration, there is a moderate amount of slough fibrin and necrotic tissue noted.  Granulation tissue and wound base is red.  There is intermittent fibrotic tissue.  There is no exposed bone muscle tendon ligament or joint.  No malodor noted.  Moderate serous drainage  noted.  Periwound is intact  Assessment: 1. s/p excisional debridement of bilateral heels with application of wound graft. DOS: 08/25/2020 2.  Ulcer right posterior heel secondary to diabetes mellitus   Plan of Care:  1. Patient was evaluated.  2.  Medically necessary excisional debridement including subcutaneous tissue was performed using a tissue nipper.  Excisional debridement of all necrotic nonviable tissue down to healthier bleeding viable tissue was performed with postdebridement measurement same as pre- 3.  Medihoney applied.  Continue Medihoney daily with a light dressing 4.  Return to clinic in 2 weeks  *Friend is Earl Lites, DPM [facility]  Dr. Edrick Kins, DPM    [address], [place] [address]                Office [phone]  Fax [phone]

## 2021-02-06 ENCOUNTER — Telehealth: Payer: Self-pay | Admitting: Podiatry

## 2021-02-06 NOTE — Telephone Encounter (Signed)
Stacy from [facility] called requesting to speak with you or your nurse. Reason:unspecified.  Please advise.

## 2021-02-07 NOTE — Telephone Encounter (Signed)
Is there a phone number that we could contact Stacy from [facility]?

## 2021-02-07 NOTE — Telephone Encounter (Signed)
Spoke with [employer]. She was asking billing related questions and costs of the grafts.  I referred her to Nelson Chimes who is the rep for [employer] grafts. - Dr. Amalia Hailey

## 2021-02-15 ENCOUNTER — Other Ambulatory Visit: Payer: Self-pay

## 2021-02-15 ENCOUNTER — Ambulatory Visit (INDEPENDENT_AMBULATORY_CARE_PROVIDER_SITE_OTHER): Payer: Medicare (Managed Care) | Admitting: Podiatry

## 2021-02-15 DIAGNOSIS — E08621 Diabetes mellitus due to underlying condition with foot ulcer: Secondary | ICD-10-CM | POA: Diagnosis not present

## 2021-02-15 DIAGNOSIS — L97402 Non-pressure chronic ulcer of unspecified heel and midfoot with fat layer exposed: Secondary | ICD-10-CM | POA: Diagnosis not present

## 2021-02-24 NOTE — Progress Notes (Signed)
   Subjective:  Patient presents today for follow-up evaluation of a right posterior heel ulcer secondary to diabetes mellitus.  Patient has been doing very well.  We do have a history of applying wound grafts with good improvement however insurance approval and authorization with reimbursement has been pending so we are holding off on the grafts at the moment.  The patient has now been applying Medihoney daily.  She presents for follow-up treatment and evaluation  Past Medical History:  Diagnosis Date   Arthritis    hands and back, back spinal stenosis   AV node arrhythmia    a. s/p AV node ablation/PPM 07/2017.   Chronic deafness in right ear    Chronic diastolic CHF (congestive heart failure) ([place])    a. Acute exacerbation occurred in setting of AF.   CKD (chronic kidney disease), stage III ([place])    DM type 2 (diabetes mellitus, type 2) ([place])    Dyspnea    with exertion   Erythropoietin deficiency anemia 08/28/2018   Hyperlipidemia    Hypertension    Hypothyroidism    Lower extremity edema    props up when can swelling then goes down (08-22-2020)   Osteoporosis    Persistent atrial fibrillation ([place])    a. diagnosed in 10/2014 by PCP, underwent failed TEE DCCV on 10/08/14, loaded with amio, successfully DCCV on 10/15/14.   Presence of permanent cardiac pacemaker    Rotator cuff tear since april 2022   Uses walker    Wears dentures    full set    Objective/Physical Exam Neurovascular status intact.  The wound to the left posterior heel has completely healed.  Complete reepithelialization has occurred.  Wound noted to the RT heel appears mostly unchanged from prior visit approximately 2.9 x0.7 x 0.2 cm.  To the noted ulceration, there is a moderate amount of slough fibrin and necrotic tissue noted.  Granulation tissue and wound base is red.  There is intermittent fibrotic tissue.  There is no exposed bone muscle tendon ligament or joint.  No malodor noted.  Moderate serous drainage  noted.  Periwound is intact  Assessment: 1. s/p excisional debridement of bilateral heels with application of wound graft. DOS: 08/25/2020 2.  Ulcer right posterior heel secondary to diabetes mellitus   Plan of Care:  1. Patient was evaluated.  2.  Medically necessary excisional debridement including subcutaneous tissue was performed using a tissue nipper.  Excisional debridement of all necrotic nonviable tissue down to healthier bleeding viable tissue was performed with postdebridement measurement same as pre- 3.  Medihoney applied.  Continue Medihoney daily with a light dressing 4.  Return to clinic in 2 weeks  *Friend is Earl Lites, DPM [facility]  Dr. Edrick Kins, DPM    [address], [place] [address]                Office [phone]  Fax [phone]

## 2021-02-28 ENCOUNTER — Ambulatory Visit (INDEPENDENT_AMBULATORY_CARE_PROVIDER_SITE_OTHER): Payer: Medicare (Managed Care)

## 2021-02-28 DIAGNOSIS — I442 Atrioventricular block, complete: Secondary | ICD-10-CM | POA: Diagnosis not present

## 2021-02-28 LAB — CUP PACEART REMOTE DEVICE CHECK
Battery Remaining Longevity: 102 mo
Battery Voltage: 3 V
Brady Statistic AP VP Percent: 0 %
Brady Statistic AP VS Percent: 0 %
Brady Statistic AS VP Percent: 99.97 %
Brady Statistic AS VS Percent: 0.03 %
Brady Statistic RA Percent Paced: 0 %
Brady Statistic RV Percent Paced: 99.97 %
Date Time Interrogation Session: 20221128233800
Implantable Lead Implant Date: 20190524
Implantable Lead Implant Date: 20190524
Implantable Lead Location: 753859
Implantable Lead Location: 753860
Implantable Lead Model: 3830
Implantable Lead Model: 5076
Implantable Pulse Generator Implant Date: 20190524
Lead Channel Impedance Value: 418 Ohm
Lead Channel Impedance Value: 437 Ohm
Lead Channel Impedance Value: 513 Ohm
Lead Channel Impedance Value: 570 Ohm
Lead Channel Pacing Threshold Amplitude: 0.625 V
Lead Channel Pacing Threshold Pulse Width: 0.4 ms
Lead Channel Sensing Intrinsic Amplitude: 1.5 mV
Lead Channel Sensing Intrinsic Amplitude: 3.25 mV
Lead Channel Sensing Intrinsic Amplitude: 6 mV
Lead Channel Setting Pacing Amplitude: 2.5 V
Lead Channel Setting Pacing Pulse Width: 0.4 ms
Lead Channel Setting Sensing Sensitivity: 4 mV

## 2021-03-08 ENCOUNTER — Other Ambulatory Visit: Payer: Self-pay

## 2021-03-08 ENCOUNTER — Ambulatory Visit (INDEPENDENT_AMBULATORY_CARE_PROVIDER_SITE_OTHER): Payer: Medicare (Managed Care) | Admitting: Podiatry

## 2021-03-08 DIAGNOSIS — L97402 Non-pressure chronic ulcer of unspecified heel and midfoot with fat layer exposed: Secondary | ICD-10-CM | POA: Diagnosis not present

## 2021-03-08 DIAGNOSIS — E08621 Diabetes mellitus due to underlying condition with foot ulcer: Secondary | ICD-10-CM | POA: Diagnosis not present

## 2021-03-08 NOTE — Progress Notes (Signed)
   Subjective:  Patient presents today for follow-up evaluation of a right posterior heel ulcer secondary to diabetes mellitus.  Patient continues to do well.  We do have a history of applying wound graft to the heel however insurance reimbursement is pending.  For the last few months we have been applying Medihoney which seems to help with the wound.  The wound has been stable with steady healing.  No new complaints at this time  Past Medical History:  Diagnosis Date   Arthritis    hands and back, back spinal stenosis   AV node arrhythmia    a. s/p AV node ablation/PPM 07/2017.   Chronic deafness in right ear    Chronic diastolic CHF (congestive heart failure) ([place])    a. Acute exacerbation occurred in setting of AF.   CKD (chronic kidney disease), stage III ([place])    DM type 2 (diabetes mellitus, type 2) ([place])    Dyspnea    with exertion   Erythropoietin deficiency anemia 08/28/2018   Hyperlipidemia    Hypertension    Hypothyroidism    Lower extremity edema    props up when can swelling then goes down (08-22-2020)   Osteoporosis    Persistent atrial fibrillation ([place])    a. diagnosed in 10/2014 by PCP, underwent failed TEE DCCV on 10/08/14, loaded with amio, successfully DCCV on 10/15/14.   Presence of permanent cardiac pacemaker    Rotator cuff tear since april 2022   Uses walker    Wears dentures    full set    Objective/Physical Exam Neurovascular status intact.  The wound to the left posterior heel has completely healed.  Complete reepithelialization has occurred.  Wound noted to the RT heel appears mostly unchanged from prior visit approximately 2.5x0.5 x 0.2 cm.  To the noted ulceration, there is a moderate amount of slough fibrin and necrotic tissue noted.  Granulation tissue and wound base is red.  There is intermittent fibrotic tissue.  There is no exposed bone muscle tendon ligament or joint.  No malodor noted.  Moderate serous drainage noted.  Periwound is  intact  Assessment: 1. s/p excisional debridement of bilateral heels with application of wound graft. DOS: 08/25/2020 2.  Ulcer right posterior heel secondary to diabetes mellitus   Plan of Care:  1. Patient was evaluated.  2.  Medically necessary excisional debridement including subcutaneous tissue was performed using a tissue nipper.  Excisional debridement of all necrotic nonviable tissue down to healthier bleeding viable tissue was performed with postdebridement measurement same as pre- 3.  Medihoney applied.  Continue Medihoney daily with a light dressing 4.  Return to clinic in 4 weeks  *Friend is Earl Lites, DPM [facility]  Dr. Edrick Kins, DPM    [address],  [address]                Office [phone]  Fax [phone]

## 2021-03-09 NOTE — Progress Notes (Signed)
Remote pacemaker transmission.   

## 2021-04-05 ENCOUNTER — Other Ambulatory Visit: Payer: Self-pay

## 2021-04-05 ENCOUNTER — Ambulatory Visit (INDEPENDENT_AMBULATORY_CARE_PROVIDER_SITE_OTHER): Payer: Medicare (Managed Care) | Admitting: Podiatry

## 2021-04-05 DIAGNOSIS — L97412 Non-pressure chronic ulcer of right heel and midfoot with fat layer exposed: Secondary | ICD-10-CM

## 2021-04-05 DIAGNOSIS — E119 Type 2 diabetes mellitus without complications: Secondary | ICD-10-CM

## 2021-04-05 NOTE — Progress Notes (Signed)
° °  Subjective:  Patient presents today for follow-up evaluation of a right posterior heel ulcer secondary to diabetes mellitus.  Patient continues to do well.  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  No new complaints at this time.  Overall she is doing very well and she wears open heel crocs daily  Past Medical History:  Diagnosis Date   Arthritis    hands and back, back spinal stenosis   AV node arrhythmia    a. s/p AV node ablation/PPM 07/2017.   Chronic deafness in right ear    Chronic diastolic CHF (congestive heart failure) ([place])    a. Acute exacerbation occurred in setting of AF.   CKD (chronic kidney disease), stage III ([place])    DM type 2 (diabetes mellitus, type 2) ([place])    Dyspnea    with exertion   Erythropoietin deficiency anemia 08/28/2018   Hyperlipidemia    Hypertension    Hypothyroidism    Lower extremity edema    props up when can swelling then goes down (08-22-2020)   Osteoporosis    Persistent atrial fibrillation ([place])    a. diagnosed in 10/2014 by PCP, underwent failed TEE DCCV on 10/08/14, loaded with amio, successfully DCCV on 10/15/14.   Presence of permanent cardiac pacemaker    Rotator cuff tear since april 2022   Uses walker    Wears dentures    full set      Objective: Physical Exam General: The patient is alert and oriented x3 in no acute distress.  Dermatology: Ulcer noted to the right posterior heel measuring approximately 2.0 x 1.0 x 0.2 cm.  To the noted ulceration there is no eschar.  There is a moderate amount of slough fibrin and necrotic tissue noted along the wound base.  Intermittent granulation tissue.  No malodor.  Periwound is intact.  Vascular: Palpable pedal pulses bilaterally. No edema or erythema noted. Capillary refill within normal limits.  Neurological: Epicritic and protective threshold grossly intact bilaterally.   Musculoskeletal Exam: No pedal deformity noted   Assessment: 1.   Ulcer right posterior heel secondary to diabetes mellitus  Plan of Care:  1. Patient was evaluated.  2.  Medically necessary excisional debridement including subcutaneous tissue was performed using a tissue nipper.  Excisional debridement of all necrotic nonviable tissue down to healthier bleeding viable tissue was performed with postdebridement measurement same as pre- 3.  Medihoney applied.  Continue Medihoney daily with a light dressing 4.  Return to clinic in 4 weeks  *Friend is Earl Lites, DPM [facility]  Dr. Edrick Kins, DPM    [address], [place] [address]                Office [phone]  Fax [phone]

## 2021-05-03 ENCOUNTER — Other Ambulatory Visit: Payer: Self-pay

## 2021-05-03 ENCOUNTER — Ambulatory Visit (INDEPENDENT_AMBULATORY_CARE_PROVIDER_SITE_OTHER): Payer: Medicare (Managed Care)

## 2021-05-03 ENCOUNTER — Ambulatory Visit (INDEPENDENT_AMBULATORY_CARE_PROVIDER_SITE_OTHER): Payer: Medicare (Managed Care) | Admitting: Podiatry

## 2021-05-03 DIAGNOSIS — L97412 Non-pressure chronic ulcer of right heel and midfoot with fat layer exposed: Secondary | ICD-10-CM | POA: Diagnosis not present

## 2021-05-03 NOTE — Progress Notes (Signed)
Subjective:  Patient presents today for follow-up evaluation of a right posterior heel ulcer secondary to diabetes mellitus.  Patient continues to do well.  She presents today with her friend who brings her to her appointments.  She has no new complaints at this time.  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  She wears open heel crocs.  Past Medical History:  Diagnosis Date   Arthritis    hands and back, back spinal stenosis   AV node arrhythmia    a. s/p AV node ablation/PPM 07/2017.   Chronic deafness in right ear    Chronic diastolic CHF (congestive heart failure) ([place])    a. Acute exacerbation occurred in setting of AF.   CKD (chronic kidney disease), stage III ([place])    DM type 2 (diabetes mellitus, type 2) ([place])    Dyspnea    with exertion   Erythropoietin deficiency anemia 08/28/2018   Hyperlipidemia    Hypertension    Hypothyroidism    Lower extremity edema    props up when can swelling then goes down (08-22-2020)   Osteoporosis    Persistent atrial fibrillation ([place])    a. diagnosed in 10/2014 by PCP, underwent failed TEE DCCV on 10/08/14, loaded with amio, successfully DCCV on 10/15/14.   Presence of permanent cardiac pacemaker    Rotator cuff tear since april 2022   Uses walker    Wears dentures    full set   Objective: Physical Exam General: The patient is alert and oriented x3 in no acute distress.  Dermatology: Ulcer noted to the right posterior heel measuring approximately 1.5x0.6 x 0.2 cm.  To the noted ulceration there is no eschar.  There is a moderate amount of slough fibrin and necrotic tissue noted along the wound base.  Intermittent granulation tissue.  No malodor.  Periwound is intact.  Minimal drainage noted.  Overall the wound appears very stable  Vascular: Palpable pedal pulses bilaterally. No edema or erythema noted. Capillary refill within normal limits.  Clinically there is no concern for vascular  compromise  Neurological: Epicritic and protective threshold grossly intact bilaterally.   Musculoskeletal Exam: No pedal deformity noted  Radiographic exam today 05/03/2021 RT foot: Diffuse degenerative changes noted with osteopenia throughout the foot which is normal given the patient's age.  On lateral view there is some slight exostosis to the posterior aspect of the tubercle of the calcaneus.  There is no osseous erosion or cortical irregularities that would be concerning for osteomyelitis to this area.  Assessment: 1.  Ulcer right posterior heel secondary to diabetes mellitus  Plan of Care:  1. Patient was evaluated.  2.  Medically necessary excisional debridement including subcutaneous tissue was performed using a tissue nipper.  Excisional debridement of all necrotic nonviable tissue down to healthier bleeding viable tissue was performed with postdebridement measurement same as pre- 3.  Continue Medihoney every other day.   4.  Return to clinic monthly for reevaluation  *Friend is Earl Lites, DPM [facility]  Dr. Edrick Kins, DPM    [address] [place] [address]                Office [phone]  Fax [phone]

## 2021-05-23 ENCOUNTER — Encounter: Payer: Self-pay | Admitting: Physician Assistant

## 2021-05-23 ENCOUNTER — Ambulatory Visit (INDEPENDENT_AMBULATORY_CARE_PROVIDER_SITE_OTHER): Payer: Medicare (Managed Care) | Admitting: Physician Assistant

## 2021-05-23 ENCOUNTER — Other Ambulatory Visit: Payer: Self-pay

## 2021-05-23 VITALS — BP 118/60 | HR 85 | Ht 62.0 in | Wt 148.2 lb

## 2021-05-23 DIAGNOSIS — Z950 Presence of cardiac pacemaker: Secondary | ICD-10-CM

## 2021-05-23 DIAGNOSIS — E119 Type 2 diabetes mellitus without complications: Secondary | ICD-10-CM

## 2021-05-23 DIAGNOSIS — Z79899 Other long term (current) drug therapy: Secondary | ICD-10-CM

## 2021-05-23 DIAGNOSIS — J849 Interstitial pulmonary disease, unspecified: Secondary | ICD-10-CM

## 2021-05-23 DIAGNOSIS — I4819 Other persistent atrial fibrillation: Secondary | ICD-10-CM

## 2021-05-23 DIAGNOSIS — I482 Chronic atrial fibrillation, unspecified: Secondary | ICD-10-CM

## 2021-05-23 DIAGNOSIS — I10 Essential (primary) hypertension: Secondary | ICD-10-CM | POA: Diagnosis not present

## 2021-05-23 LAB — LIPID PANEL

## 2021-05-23 NOTE — Progress Notes (Signed)
Cardiology Office Note:    Date:  05/24/2021   ID:  CAELAN ATCHLEY, DOB Aug 03, 1932, MRN [id number]  PCP:  [name], MD   [facility] Providers Cardiologist:  Quay Burow, MD Electrophysiologist:  [name], MD     Referring MD: [name], MD   Chief Complaint  Patient presents with   Follow-up    Seen for [name]    History of Present Illness:    Ann Fletcher is a 88 y.o. female with a hx of HTN, DM, COPD/ILD and atrial fibrillation s/p AVN ablation and PPM 08/23/2017.  She was on amiodarone but developed pulmonary fibrosis and junctional bradycardia.  After she was taken off of amiodarone, she went back into atrial fibrillation with uncontrolled ventricular rate.  She ultimately had AV nodal ablation and pacemaker placement in May 2019.  She had CHF and significant anemia in August 2019.  She was diuresed from 72 kg down to 69.6 kg.  GI work-up did not show obvious sign of bleeding.  The original plan is to consider resuming Eliquis when her hemoglobin stabilized.  She insisted to be discharged on 11/07/2017.  Follow-up hemoglobin continue to show significant anemia and she has declined anticoagulation. She has been followed by Dr. Marin Olp of hematology oncology service, with iron therapy, her anemia finally improved.  She was seen by [name] in December 2020 who felt she was volume overloaded at the time.  Lasix was increased with improvement in his heart weight.  She was most recently seen by [name] in July 2021 at which time she was doing well.  Patient presents today for follow-up.  She has not had any bleeding issue since our last visit.  She denies any recent chest pain or worsening dyspnea.  She has no lower extremity edema and orthopnea.  Although patient has occasional shortness of breath and night, however she appears to be euvolemic on physical exam.  She has fine crackles in bilateral bases of the lung related to history of interstitial lung  disease.  I recommended continue observation for the time being.  If her breathing worsens, I would have low threshold to repeat chest x-ray and increase Lasix to 80 mg twice a day.  Overall, she seems to be stable from the cardiac perspective and can follow-up in 1 year.    Past Medical History:  Diagnosis Date   Arthritis    hands and back, back spinal stenosis   AV node arrhythmia    a. s/p AV node ablation/PPM 07/2017.   Chronic deafness in right ear    Chronic diastolic CHF (congestive heart failure) ([place])    a. Acute exacerbation occurred in setting of AF.   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 diagnosed in 10/2014 by PCP, underwent failed TEE DCCV on 10/08/14, loaded with amio, successfully DCCV on 10/15/14.   Presence of permanent cardiac pacemaker    Rotator cuff tear since april 2022   Uses walker    Wears dentures    full set    Past Surgical History:  Procedure Laterality Date  AV NODE ABLATION N/A 08/23/2017   Procedure: AV NODE ABLATION;  Surgeon: [name], MD;  Location: [place] [facility];  Service: Cardiovascular;  Laterality: N/A;   BACK SURGERY  2001, 2003, 2006   x 3 lower back has rods and screws in back   BIOPSY  11/06/2017   Procedure: BIOPSY;  Surgeon: [name], MD;  Location: [place];  Service: Endoscopy;;   CARDIOVERSION N/A 10/08/2014   Procedure: CARDIOVERSION;  Surgeon: [name], MD;  Location: [place];  Service: Cardiovascular;  Laterality: N/A;   CARDIOVERSION N/A 10/15/2014   Procedure: CARDIOVERSION;  Surgeon: [name], MD;  Location: [place];  Service: Cardiovascular;  Laterality: N/A;    CARDIOVERSION N/A 11/15/2014   Procedure: CARDIOVERSION;  Surgeon: [name], MD;  Location: [place];  Service: Cardiovascular;  Laterality: N/A;   COLONOSCOPY N/A 11/06/2017   Procedure: COLONOSCOPY;  Surgeon: [name], MD;  Location: [facility];  Service: Endoscopy;  Laterality: N/A;   ESOPHAGOGASTRODUODENOSCOPY N/A 11/06/2017   Procedure: ESOPHAGOGASTRODUODENOSCOPY (EGD);  Surgeon: [name], MD;  Location: [facility];  Service: Endoscopy;  Laterality: N/A;   GRAFT APPLICATION Bilateral 1/61/0960   Procedure: GRAFT APPLICATION  ACELL;  Surgeon: Edrick Kins, DPM;  Location: [place];  Service: Podiatry;  Laterality: Bilateral;   INTRAMEDULLARY (IM) NAIL INTERTROCHANTERIC Right 02/08/2020   Procedure: INTRAMEDULLARY (IM) NAIL INTERTROCHANTRIC;  Surgeon: Hiram Gash, MD;  Location: [facility];  Service: Orthopedics;  Laterality: Right;   PACEMAKER IMPLANT N/A 08/23/2017   Procedure: PACEMAKER IMPLANT;  Surgeon: [name], MD;  Location: [place] [facility];  Service: Cardiovascular;  Laterality: N/A;   POLYPECTOMY  11/06/2017   Procedure: POLYPECTOMY;  Surgeon: [name], MD;  Location: [place];  Service: Endoscopy;;   REPLACEMENT TOTAL KNEE Right 07/2008   TEE WITHOUT CARDIOVERSION N/A 10/08/2014   Procedure: TRANSESOPHAGEAL ECHOCARDIOGRAM (TEE);  Surgeon: [name], MD;  Location: [place];  Service: Cardiovascular;  Laterality: N/A;   TUBAL LIGATION  yrs ago   WOUND DEBRIDEMENT Bilateral 08/25/2020   Procedure: DEBRIDEMENT WOUND BILATERAL FEET;  Surgeon: Edrick Kins, DPM;  Location: [employer];  Service: Podiatry;  Laterality: Bilateral;    Current Medications: Current Meds  Medication Sig   acetaminophen (TYLENOL) 500 MG tablet Take 1,000 mg by mouth at bedtime.   albuterol (PROVENTIL HFA;VENTOLIN HFA) 108 (90 BASE) MCG/ACT inhaler Inhale 2 puffs into the lungs 3 (three) times daily.   aspirin 81 MG chewable  tablet Chew 81 mg by mouth daily.   atorvastatin (LIPITOR) 40 MG tablet Take 40 mg by mouth at bedtime.   Calcium Carbonate-Vitamin D (OSCAL 500/200 D-3 PO) Take 1 tablet by mouth daily.   clindamycin (CLEOCIN) 300 MG capsule Take 1 capsule (300 mg total) by mouth 3 (three) times daily.   Cyanocobalamin (VITAMIN B 12 PO) Take 1,000 mcg by mouth daily.   fluticasone (FLONASE) 50 MCG/ACT nasal spray Place 1 spray into both nostrils daily as needed for allergies.    furosemide (LASIX) 80 MG tablet TAKE ONE WHOLE TABLET (80MG ) IN THE MORNING AND HALF TABLET (40 MG) IN THE EVENING.   gentamicin cream (GARAMYCIN) 0.1 % Apply 1 application topically 2 (two) times daily.   glimepiride (AMARYL) 2 MG tablet Take 2 mg by mouth every morning.   HYDROcodone-acetaminophen (NORCO/VICODIN) 5-325 MG tablet Take 1 tablet by mouth every 4 (four) hours as needed for moderate pain.   Lancet Devices (PRODIGY LANCING  DEVICE) MISC See admin instructions.   levothyroxine (SYNTHROID, LEVOTHROID) 100 MCG tablet Take 100 mcg by mouth daily before breakfast.    ondansetron (ZOFRAN) 4 MG tablet Take 4 mg by mouth every 6 (six) hours as needed for vomiting or nausea/vomiting.   potassium chloride SA (K-DUR,KLOR-CON) 20 MEQ tablet Take 20 mEq by mouth 2 (two) times daily.    Prodigy Twist Top Lancets 28G MISC CHECK BLOOD GLUCOSE (SUGAR) TWICE DAILY   vitamin E (VITAMIN E) 180 MG (400 UNITS) capsule Take 400 Units by mouth daily.      Allergies:   Tizanidine hcl, Nsaids, Pradaxa [dabigatran etexilate mesylate], Sulfa antibiotics, and Betadine [povidone iodine]   Social History   Socioeconomic History   Marital status: Married    Spouse name: Not on file   Number of children: Not on file   Years of education: Not on file   Highest education level: Not on file  Occupational History   Not on file  Tobacco Use   Smoking status: Never   Smokeless tobacco: Never  Vaping Use   Vaping Use: Never used  Substance and Sexual  Activity   Alcohol use: No   Drug use: Never   Sexual activity: Yes    Birth control/protection: Surgical  Other Topics Concern   Not on file  Social History Narrative   Not on file   Social Determinants of Health   Financial Resource Strain: Not on file  Food Insecurity: Not on file  Transportation Needs: Not on file  Physical Activity: Not on file  Stress: Not on file  Social Connections: Not on file     Family History: The patient's family history includes Diabetes in her sister; Heart attack in her father; Pneumonia in her mother.  ROS:   Please see the history of present illness.     All other systems reviewed and are negative.  EKGs/Labs/Other Studies Reviewed:    The following studies were reviewed today:  Echo 11/01/2017 LV EF: 60% -   65%   -------------------------------------------------------------------  History:   PMH:  CHF 428.21.  Atrial fibrillation.  Congestive  heart failure.  Risk factors:  AV Node Ablation. CKD. Hypertension.  Diabetes mellitus. Dyslipidemia.   -------------------------------------------------------------------  Study Conclusions   - Left ventricle: The cavity size was normal. There was severe    concentric hypertrophy. Systolic function was normal. The    estimated ejection fraction was in the range of 60% to 65%. Wall    motion was normal; there were no regional wall motion    abnormalities. The study is not technically sufficient to allow    evaluation of LV diastolic function.  - Aortic valve: Trileaflet; mildly thickened, mildly calcified    leaflets.  - Mitral valve: There was mild regurgitation.  - Right ventricle: The cavity size was moderately dilated. Wall    thickness was normal. The moderator band was prominent. Pacer    wire or catheter noted in right ventricle. Systolic function was    mildly to moderately reduced.  - Right atrium: The atrium was moderately dilated. Pacer wire or    catheter noted in right  atrium.  - Tricuspid valve: There was moderate regurgitation.  - Pericardium, extracardiac: A trivial, free-flowing pericardial    effusion was identified circumferential to the heart. The fluid    had no internal echoes.There was no evidence of hemodynamic    compromise.   EKG:  EKG is ordered today.  The ekg ordered today demonstrates V paced  rhythm  Recent Labs: 05/23/2021: BUN 70; Creatinine, Ser 1.82; Hemoglobin 10.6; Platelets 225; Potassium 5.6; Sodium 137  Recent Lipid Panel    Component Value Date/Time   CHOL 163 05/23/2021 1226   TRIG 147 05/23/2021 1226   HDL 61 05/23/2021 1226   CHOLHDL 2.7 05/23/2021 1226   LDLCALC 77 05/23/2021 1226     Risk Assessment/Calculations:           Physical Exam:    VS:  BP 118/60 (BP Location: Right Arm, Patient Position: Sitting, Cuff Size: Normal)    Pulse 85    Ht 5\' 2"  (1.575 m)    Wt 148 lb 3.2 oz (67.2 kg)    SpO2 97%    BMI 27.11 kg/m     Wt Readings from Last 3 Encounters:  05/23/21 148 lb 3.2 oz (67.2 kg)  09/27/20 143 lb 6.4 oz (65 kg)  08/25/20 142 lb 6.4 oz (64.6 kg)     GEN:  Well nourished, well developed in no acute distress HEENT: Normal NECK: No JVD; No carotid bruits LYMPHATICS: No lymphadenopathy CARDIAC: RRR, no murmurs, rubs, gallops RESPIRATORY:  Clear to auscultation without wheezing or rhonchi.  Fine crackles in bilateral bases ABDOMEN: Soft, non-tender, non-distended MUSCULOSKELETAL:  No edema; No deformity  SKIN: Warm and dry NEUROLOGIC:  Alert and oriented x 3 PSYCHIATRIC:  Normal affect   ASSESSMENT:    1. Chronic atrial fibrillation ([place])   2. Medication management   3. Pacemaker   4. Essential hypertension   5. Controlled type 2 diabetes mellitus without complication, without long-term current use of insulin ([place])   6. Interstitial lung disease ([place])    PLAN:    In order of problems listed above:  Chronic atrial fibrillation: Status post AV nodal ablation.  EKG showed paced rhythm.   She is not on anticoagulation therapy due to history of recurrent anemia.  Medication management.  Obtain CBC, basic metabolic panel and lipid panel  History of pacemaker: Patient is pacemaker dependent due to history of AV nodal ablation.  Followed by EP service.  Last interrogation obtained in November showed normal device function  Hypertension: Blood pressure stable  DM2: Managed by primary care provider  Interstitial lung disease: Followed by pulmonology service.  Fine crackles near the base of the lung.           Medication Adjustments/Labs and Tests Ordered: Current medicines are reviewed at length with the patient today.  Concerns regarding medicines are outlined above.  Orders Placed This Encounter  Procedures   Lipid panel   Basic metabolic panel   CBC   EKG 12-Lead   No orders of the defined types were placed in this encounter.   Patient Instructions  Medication Instructions:  Your physician recommends that you continue on your current medications as directed. Please refer to the Current Medication list given to you today.  *If you need a refill on your cardiac medications before your next appointment, please call your pharmacy*   Lab Work: Your physician recommends that complete lab work today Fasting Lipid CBC CMP  If you have labs (blood work) drawn today and your tests are completely normal, you will receive your results only by: [place] (if you have MyChart) OR A paper copy in the mail If you have any lab test that is abnormal or we need to change your treatment, we will call you to review the results.   Testing/Procedures: NONE ordered at this time of appointment   Follow-Up: At  [facility], you and your health needs are our priority.  As part of our continuing mission to provide you with exceptional heart care, we have created designated Provider Care Teams.  These Care Teams include your primary Cardiologist (physician) and Advanced  Practice Providers (APPs -  Physician Assistants and Nurse Practitioners) who all work together to provide you with the care you need, when you need it.  We recommend signing up for the patient portal called "MyChart".  Sign up information is provided on this After Visit Summary.  MyChart is used to connect with patients for Virtual Visits (Telemedicine).  Patients are able to view lab/test results, encounter notes, upcoming appointments, etc.  Non-urgent messages can be sent to your provider as well.   To learn more about what you can do with MyChart, go to [email or web address].    Your next appointment:   1 year(s)  The format for your next appointment:   In Person  Provider:   Quay Burow, MD     Other Instructions     Signed, Ann Fletcher, [place]  05/24/2021 10:25 PM    [place]

## 2021-05-23 NOTE — Patient Instructions (Signed)
Medication Instructions:  Your physician recommends that you continue on your current medications as directed. Please refer to the Current Medication list given to you today.  *If you need a refill on your cardiac medications before your next appointment, please call your pharmacy*   Lab Work: Your physician recommends that complete lab work today Fasting Lipid CBC CMP  If you have labs (blood work) drawn today and your tests are completely normal, you will receive your results only by: [place] (if you have MyChart) OR A paper copy in the mail If you have any lab test that is abnormal or we need to change your treatment, we will call you to review the results.   Testing/Procedures: NONE ordered at this time of appointment   Follow-Up: At [facility], you and your health needs are our priority.  As part of our continuing mission to provide you with exceptional heart care, we have created designated Provider Care Teams.  These Care Teams include your primary Cardiologist (physician) and Advanced Practice Providers (APPs -  Physician Assistants and Nurse Practitioners) who all work together to provide you with the care you need, when you need it.  We recommend signing up for the patient portal called "MyChart".  Sign up information is provided on this After Visit Summary.  MyChart is used to connect with patients for Virtual Visits (Telemedicine).  Patients are able to view lab/test results, encounter notes, upcoming appointments, etc.  Non-urgent messages can be sent to your provider as well.   To learn more about what you can do with MyChart, go to [email or web address].    Your next appointment:   1 year(s)  The format for your next appointment:   In Person  Provider:   Quay Burow, MD     Other Instructions

## 2021-05-24 ENCOUNTER — Encounter: Payer: Self-pay | Admitting: Physician Assistant

## 2021-05-24 LAB — BASIC METABOLIC PANEL
BUN/Creatinine Ratio: 38 — ABNORMAL HIGH (ref 12–28)
BUN: 70 mg/dL — ABNORMAL HIGH (ref 8–27)
CO2: 19 mmol/L — ABNORMAL LOW (ref 20–29)
Calcium: 9.5 mg/dL (ref 8.7–10.3)
Chloride: 103 mmol/L (ref 96–106)
Creatinine, Ser: 1.82 mg/dL — ABNORMAL HIGH (ref 0.57–1.00)
Glucose: 94 mg/dL (ref 70–99)
Potassium: 5.6 mmol/L — ABNORMAL HIGH (ref 3.5–5.2)
Sodium: 137 mmol/L (ref 134–144)
eGFR: 27 mL/min/{1.73_m2} — ABNORMAL LOW (ref >59)

## 2021-05-24 LAB — LIPID PANEL
Chol/HDL Ratio: 2.7 ratio (ref 0.0–4.4)
Cholesterol, Total: 163 mg/dL (ref 100–199)
HDL: 61 mg/dL (ref >39)
LDL Chol Calc (NIH): 77 mg/dL (ref 0–99)
Triglycerides: 147 mg/dL (ref 0–149)
VLDL Cholesterol Cal: 25 mg/dL (ref 5–40)

## 2021-05-24 LAB — CBC
Hematocrit: 33.1 % — ABNORMAL LOW (ref 34.0–46.6)
Hemoglobin: 10.6 g/dL — ABNORMAL LOW (ref 11.1–15.9)
MCH: 26.1 pg — ABNORMAL LOW (ref 26.6–33.0)
MCHC: 32 g/dL (ref 31.5–35.7)
MCV: 82 fL (ref 79–97)
Platelets: 225 10*3/uL (ref 150–450)
RBC: 4.06 x10E6/uL (ref 3.77–5.28)
RDW: 13.5 % (ref 11.7–15.4)
WBC: 9.5 10*3/uL (ref 3.4–10.8)

## 2021-05-30 ENCOUNTER — Other Ambulatory Visit: Payer: Self-pay

## 2021-05-30 ENCOUNTER — Ambulatory Visit (INDEPENDENT_AMBULATORY_CARE_PROVIDER_SITE_OTHER): Payer: Medicare (Managed Care)

## 2021-05-30 DIAGNOSIS — I442 Atrioventricular block, complete: Secondary | ICD-10-CM | POA: Diagnosis not present

## 2021-05-30 LAB — CUP PACEART REMOTE DEVICE CHECK
Battery Remaining Longevity: 100 mo
Battery Voltage: 2.99 V
Brady Statistic AP VP Percent: 0 %
Brady Statistic AP VS Percent: 0 %
Brady Statistic AS VP Percent: 99.56 %
Brady Statistic AS VS Percent: 0.44 %
Brady Statistic RA Percent Paced: 0 %
Brady Statistic RV Percent Paced: 99.56 %
Date Time Interrogation Session: 20230227235235
Implantable Lead Implant Date: 20190524
Implantable Lead Implant Date: 20190524
Implantable Lead Location: 753859
Implantable Lead Location: 753860
Implantable Lead Model: 3830
Implantable Lead Model: 5076
Implantable Pulse Generator Implant Date: 20190524
Lead Channel Impedance Value: 399 Ohm
Lead Channel Impedance Value: 437 Ohm
Lead Channel Impedance Value: 513 Ohm
Lead Channel Impedance Value: 589 Ohm
Lead Channel Pacing Threshold Amplitude: 0.625 V
Lead Channel Pacing Threshold Pulse Width: 0.4 ms
Lead Channel Sensing Intrinsic Amplitude: 1.5 mV
Lead Channel Sensing Intrinsic Amplitude: 3.25 mV
Lead Channel Sensing Intrinsic Amplitude: 6 mV
Lead Channel Setting Pacing Amplitude: 2.5 V
Lead Channel Setting Pacing Pulse Width: 0.4 ms
Lead Channel Setting Sensing Sensitivity: 4 mV

## 2021-05-30 MED ORDER — FUROSEMIDE 80 MG PO TABS: ORAL_TABLET | ORAL | 5 refills | Status: DC

## 2021-05-31 ENCOUNTER — Encounter: Payer: Self-pay | Admitting: Hematology & Oncology

## 2021-06-06 ENCOUNTER — Other Ambulatory Visit: Payer: Self-pay

## 2021-06-06 DIAGNOSIS — Z79899 Other long term (current) drug therapy: Secondary | ICD-10-CM

## 2021-06-07 ENCOUNTER — Other Ambulatory Visit: Payer: Self-pay

## 2021-06-07 ENCOUNTER — Ambulatory Visit (INDEPENDENT_AMBULATORY_CARE_PROVIDER_SITE_OTHER): Payer: Medicare (Managed Care) | Admitting: Podiatry

## 2021-06-07 ENCOUNTER — Telehealth: Payer: Self-pay | Admitting: Podiatry

## 2021-06-07 DIAGNOSIS — E119 Type 2 diabetes mellitus without complications: Secondary | ICD-10-CM | POA: Diagnosis not present

## 2021-06-07 DIAGNOSIS — L97412 Non-pressure chronic ulcer of right heel and midfoot with fat layer exposed: Secondary | ICD-10-CM

## 2021-06-07 NOTE — Progress Notes (Signed)
? ?Subjective:  ?Patient presents today for follow-up evaluation of a right posterior heel ulcer secondary to diabetes mellitus.  Patient continues to do well.  She presents today with her friend who brings her to her appointments.  She has no new complaints at this time.  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  She wears open heel crocs. ? ?Past Medical History:  ?Diagnosis Date  ? Arthritis   ? hands and back, back spinal stenosis  ? AV node arrhythmia   ? a. s/p AV node ablation/PPM 07/2017.  ? Chronic deafness in right ear   ? Chronic diastolic CHF (congestive heart failure) ([place])   ? a. Acute exacerbation occurred in setting of AF.  ? CKD (chronic kidney disease), stage III ([place])   ? DM type 2 (diabetes mellitus, type 2) ([place])   ? Dyspnea   ? with exertion  ? 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 Hyperlipidemia   ? Hypertension   ? Hypothyroidism   ? Lower extremity edema   ? props up when can swelling then goes down (08-22-2020)  ? Osteoporosis   ? Persistent atrial fibrillation ([place])   ? a. diagnosed in 10/2014 by PCP, underwent failed TEE DCCV on 10/08/14, loaded with amio, successfully DCCV on 10/15/14.  ? Presence of permanent cardiac pacemaker   ? Rotator cuff tear since april 2022  ? Uses walker   ? Wears dentures   ? full set  ? ?Objective: ?Physical Exam ?General: The patient is alert and oriented x3 in no acute distress. ? ?Dermatology: There continues to be an ulcer noted to the right posterior heel measuring approximately 1.3 x 0.5 x 0.2 cm.  To the noted ulceration there is no eschar.  There is a moderate amount of slough fibrin and necrotic tissue noted along the wound base.  Intermittent granulation tissue.  No malodor.  Periwound is intact.  Minimal drainage noted.  Overall the wound appears very stable ? ?Vascular: Palpable pedal pulses bilaterally. No edema or erythema noted. Capillary refill within normal limits.  Clinically there is  no concern for vascular compromise ? ?Neurological: Epicritic and protective threshold grossly intact bilaterally.  ? ?Musculoskeletal Exam: No pedal deformity noted ? ?Radiographic exam 05/03/2021 RT foot: Diffuse degenerative changes noted with osteopenia throughout the foot which is normal given the patient's age.  On lateral view there is some slight exostosis to the posterior aspect of the tubercle of the calcaneus.  There is no osseous erosion or cortical irregularities that would be concerning for osteomyelitis to this area. ? ?Assessment: ?1.  Ulcer right posterior heel secondary to diabetes mellitus ? ?Plan of Care:  ?1. Patient was evaluated.  ?2.  Medically necessary excisional debridement including subcutaneous tissue was performed using a tissue nipper.  Excisional debridement of all necrotic nonviable tissue down to healthier bleeding viable tissue was performed with postdebridement measurement same as pre- ?3.  Continue Medihoney every other day.   ?4.  Return to clinic monthly for reevaluation ? ?*Friend is Madalyn ? ?Edrick Kins, DPM ?[place] ? ?Dr. Edrick Kins, DPM  ?  ?[address]                                    ?[place], [place] [address]                ?  Office [phone]  ?Fax [phone] ? ? ? ? ? ?

## 2021-06-07 NOTE — Progress Notes (Signed)
Remote pacemaker transmission.   

## 2021-06-07 NOTE — Telephone Encounter (Signed)
Pt called stating when she got home she got the wrong paperwork it was for another pt. ? ?I have asked her to mail it back to [place] and we are going to mail hers to her.Marland KitchenMarland KitchenShe said she would try to mail it to [place]. ?

## 2021-06-09 ENCOUNTER — Other Ambulatory Visit: Payer: Self-pay

## 2021-06-09 DIAGNOSIS — Z79899 Other long term (current) drug therapy: Secondary | ICD-10-CM

## 2021-06-21 ENCOUNTER — Other Ambulatory Visit: Payer: Self-pay

## 2021-06-21 ENCOUNTER — Telehealth: Payer: Self-pay

## 2021-06-21 DIAGNOSIS — E875 Hyperkalemia: Secondary | ICD-10-CM

## 2021-06-21 DIAGNOSIS — Z79899 Other long term (current) drug therapy: Secondary | ICD-10-CM

## 2021-06-21 LAB — BASIC METABOLIC PANEL
BUN/Creatinine Ratio: 39 — ABNORMAL HIGH (ref 12–28)
BUN: 70 mg/dL — ABNORMAL HIGH (ref 8–27)
CO2: 17 mmol/L — ABNORMAL LOW (ref 20–29)
Calcium: 9.6 mg/dL (ref 8.7–10.3)
Chloride: 102 mmol/L (ref 96–106)
Creatinine, Ser: 1.8 mg/dL — ABNORMAL HIGH (ref 0.57–1.00)
Glucose: 171 mg/dL — ABNORMAL HIGH (ref 70–99)
Potassium: 6.1 mmol/L (ref 3.5–5.2)
Sodium: 137 mmol/L (ref 134–144)
eGFR: 27 mL/min/{1.73_m2} — ABNORMAL LOW (ref >59)

## 2021-06-21 NOTE — Progress Notes (Signed)
Placed order for BMET per Almyra Deforest, PA-C ?

## 2021-06-21 NOTE — Telephone Encounter (Signed)
Received a call from [employer] calling to report elevated potassium 6.1.Called patient no answer.No voice mail.Called patient's daughter Watt Climes left message to call back.Message sent to [facility] PA. ?

## 2021-06-21 NOTE — Telephone Encounter (Signed)
As mentioned through chat message, this was addressed early this morning.

## 2021-06-28 DIAGNOSIS — E875 Hyperkalemia: Secondary | ICD-10-CM | POA: Diagnosis not present

## 2021-06-29 LAB — BASIC METABOLIC PANEL
BUN/Creatinine Ratio: 49 — ABNORMAL HIGH (ref 12–28)
BUN: 96 mg/dL (ref 8–27)
CO2: 18 mmol/L — ABNORMAL LOW (ref 20–29)
Calcium: 9.8 mg/dL (ref 8.7–10.3)
Chloride: 102 mmol/L (ref 96–106)
Creatinine, Ser: 1.94 mg/dL — ABNORMAL HIGH (ref 0.57–1.00)
Glucose: 129 mg/dL — ABNORMAL HIGH (ref 70–99)
Potassium: 5.1 mmol/L (ref 3.5–5.2)
Sodium: 137 mmol/L (ref 134–144)
eGFR: 25 mL/min/{1.73_m2} — ABNORMAL LOW (ref >59)

## 2021-06-29 NOTE — Progress Notes (Signed)
Kidney function still down, reduce lasix to '60mg'$  daily, potassium level normalized. Make sure potassium supplement has been stopped. Repeat BMET in 1-2 month.

## 2021-07-05 ENCOUNTER — Ambulatory Visit (INDEPENDENT_AMBULATORY_CARE_PROVIDER_SITE_OTHER): Payer: Medicare (Managed Care) | Admitting: Podiatry

## 2021-07-05 DIAGNOSIS — L97412 Non-pressure chronic ulcer of right heel and midfoot with fat layer exposed: Secondary | ICD-10-CM

## 2021-07-05 MED ORDER — FUROSEMIDE 40 MG PO TABS: 60.0000 mg | ORAL_TABLET | Freq: Every day | ORAL | 3 refills | Status: DC

## 2021-07-05 NOTE — Addendum Note (Signed)
Addended by: Jacqulynn Cadet on: 07/05/2021 05:32 PM ? ? Modules accepted: Orders ? ?

## 2021-07-05 NOTE — Addendum Note (Signed)
Addended by: Jacqulynn Cadet on: 07/05/2021 05:43 PM ? ? Modules accepted: Orders ? ?

## 2021-07-19 NOTE — Progress Notes (Signed)
? ?Subjective:  ?Patient presents today for follow-up evaluation of a right posterior heel ulcer secondary to diabetes mellitus.  Patient continues to do well.  She presents today with her friend who brings her to her appointments.  She has no new complaints at this time.  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  She wears open heel crocs. ? ?Past Medical History:  ?Diagnosis Date  ? Arthritis   ? hands and back, back spinal stenosis  ? AV node arrhythmia   ? a. s/p AV node ablation/PPM 07/2017.  ? Chronic deafness in right ear   ? Chronic diastolic CHF (congestive heart failure) ([place])   ? a. Acute exacerbation occurred in setting of AF.  ? CKD (chronic kidney disease), stage III ([place])   ? DM type 2 (diabetes mellitus, type 2) ([place])   ? Dyspnea   ? with exertion  ? 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 Hyperlipidemia   ? Hypertension   ? Hypothyroidism   ? Lower extremity edema   ? props up when can swelling then goes down (08-22-2020)  ? Osteoporosis   ? Persistent atrial fibrillation ([place])   ? a. diagnosed in 10/2014 by PCP, underwent failed TEE DCCV on 10/08/14, loaded with amio, successfully DCCV on 10/15/14.  ? Presence of permanent cardiac pacemaker   ? Rotator cuff tear since april 2022  ? Uses walker   ? Wears dentures   ? full set  ? ? ? ? ?Objective: ?Physical Exam ?General: The patient is alert and oriented x3 in no acute distress. ? ?Dermatology: There continues to be an ulcer noted to the right posterior heel measuring approximately 1.3 x 0.5 x 0.2 cm.  To the noted ulceration there is no eschar.  There is a moderate amount of slough fibrin and necrotic tissue noted along the wound base.  Overall the wound appears to continue to demonstrate good routine healing. ? ?Vascular: Palpable pedal pulses bilaterally. No edema or erythema noted. Capillary refill within normal limits.  Clinically there is no concern for vascular compromise ? ?Neurological:  Epicritic and protective threshold grossly intact bilaterally.  ? ?Musculoskeletal Exam: No pedal deformity noted ? ?Radiographic exam 05/03/2021 RT foot: Diffuse degenerative changes noted with osteopenia throughout the foot which is normal given the patient's age.  On lateral view there is some slight exostosis to the posterior aspect of the tubercle of the calcaneus.  There is no osseous erosion or cortical irregularities that would be concerning for osteomyelitis to this area. ? ?Assessment: ?1.  Ulcer right posterior heel secondary to diabetes mellitus with improvement ? ?Plan of Care:  ?1. Patient was evaluated.  ?2.  Medically necessary excisional debridement including subcutaneous tissue was performed using a tissue nipper.  Excisional debridement of all necrotic nonviable tissue down to healthier bleeding viable tissue was performed with postdebridement measurement same as pre- ?3.  Continue Medihoney every other day.   ?4.  Return to clinic monthly for reevaluation ? ?*Friend is Madalyn ? ?Edrick Kins, DPM ?[place] ? ?Dr. Edrick Kins, DPM  ?  ?[address]                                    ?[place], [place] [address]                ?Office [phone]  [phone]  ?Fax [phone] ? ? ? ? ? ?

## 2021-07-28 ENCOUNTER — Other Ambulatory Visit: Payer: Self-pay

## 2021-07-28 DIAGNOSIS — Z79899 Other long term (current) drug therapy: Secondary | ICD-10-CM

## 2021-07-28 DIAGNOSIS — E875 Hyperkalemia: Secondary | ICD-10-CM

## 2021-08-07 ENCOUNTER — Encounter: Payer: Self-pay | Admitting: Podiatry

## 2021-08-07 ENCOUNTER — Ambulatory Visit (INDEPENDENT_AMBULATORY_CARE_PROVIDER_SITE_OTHER): Payer: Medicare (Managed Care) | Admitting: Podiatry

## 2021-08-07 DIAGNOSIS — B351 Tinea unguium: Secondary | ICD-10-CM

## 2021-08-07 DIAGNOSIS — M79676 Pain in unspecified toe(s): Secondary | ICD-10-CM | POA: Diagnosis not present

## 2021-08-07 DIAGNOSIS — L97412 Non-pressure chronic ulcer of right heel and midfoot with fat layer exposed: Secondary | ICD-10-CM | POA: Diagnosis not present

## 2021-08-07 NOTE — Progress Notes (Signed)
? ?  SUBJECTIVE ?Patient PMHx diabetes mellitus type 2 presents to office today complaining of elongated, thickened nails that cause pain while ambulating in shoes.  Patient is unable to trim their own nails.  ? ?Patient also has chronic right posterior heel ulcer.  She states that recently she has not been applying anything to the wound.  She has no pain to the area patient is here for further evaluation and treatment. ? ?Past Medical History:  ?Diagnosis Date  ? Arthritis   ? hands and back, back spinal stenosis  ? AV node arrhythmia   ? a. s/p AV node ablation/PPM 07/2017.  ? Chronic deafness in right ear   ? Chronic diastolic CHF (congestive heart failure) ([place])   ? a. Acute exacerbation occurred in setting of AF.  ? CKD (chronic kidney disease), stage III ([place])   ? DM type 2 (diabetes mellitus, type 2) ([place])   ? Dyspnea   ? with exertion  ? 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 Hyperlipidemia   ? Hypertension   ? Hypothyroidism   ? Lower extremity edema   ? props up when can swelling then goes down (08-22-2020)  ? Osteoporosis   ? Persistent atrial fibrillation ([place])   ? a. diagnosed in 10/2014 by PCP, underwent failed TEE DCCV on 10/08/14, loaded with amio, successfully DCCV on 10/15/14.  ? Presence of permanent cardiac pacemaker   ? Rotator cuff tear since april 2022  ? Uses walker   ? Wears dentures   ? full set  ? ? ?OBJECTIVE ?General Patient is awake, alert, and oriented x 3 and in no acute distress. ?Derm Skin is dry and supple bilateral. Negative open lesions or macerations. Remaining integument unremarkable. Nails are tender, long, thickened and dystrophic with subungual debris, consistent with onychomycosis, 1-5 bilateral. No signs of infection noted. ?The ulcer to the right posterior heel has healed.  Complete reepithelialization has occurred.  There is some callus formation around the posterior aspect of the heel but no drainage no open wound. ?Vasc  DP and PT pedal pulses palpable bilaterally.  Temperature gradient within normal limits.  ?Neuro Epicritic and protective threshold sensation grossly intact bilaterally.  ?Musculoskeletal Exam No symptomatic pedal deformities noted bilateral. Muscular strength within normal limits. ? ?ASSESSMENT ?1.  Pain due to onychomycosis of toenails both ?2.  Right posterior heel ulcer secondary to diabetes mellitus; healed ? ?PLAN OF CARE ?1. Patient evaluated today.  ?2. Instructed to maintain good pedal hygiene and foot care.  ?3. Mechanical debridement of nails 1-5 bilaterally performed using a nail nipper. Filed with dremel without incident.  ?4. Foot Miracle therapeutic cream provided for the patient at checkout to apply to the posterior heel daily  ?5.  Return to clinic in 3 mos. for routine foot care ? ? ?Edrick Kins, DPM ?[place] ? ?Dr. Edrick Kins, DPM  ?  ?[address]                                     ?[place], [place] [address]                ?Office [phone]  ?Fax [phone] ? ? ? ? ?

## 2021-08-22 ENCOUNTER — Encounter: Payer: Self-pay | Admitting: Hematology & Oncology

## 2021-08-29 ENCOUNTER — Ambulatory Visit (INDEPENDENT_AMBULATORY_CARE_PROVIDER_SITE_OTHER): Payer: Medicare (Managed Care)

## 2021-08-29 DIAGNOSIS — I442 Atrioventricular block, complete: Secondary | ICD-10-CM

## 2021-08-29 DIAGNOSIS — I482 Chronic atrial fibrillation, unspecified: Secondary | ICD-10-CM

## 2021-08-29 LAB — CUP PACEART REMOTE DEVICE CHECK
Battery Remaining Longevity: 99 mo
Battery Voltage: 2.99 V
Brady Statistic AP VP Percent: 0 %
Brady Statistic AP VS Percent: 0 %
Brady Statistic AS VP Percent: 99.99 %
Brady Statistic AS VS Percent: 0.01 %
Brady Statistic RA Percent Paced: 0 %
Brady Statistic RV Percent Paced: 99.99 %
Date Time Interrogation Session: 20230530004930
Implantable Lead Implant Date: 20190524
Implantable Lead Implant Date: 20190524
Implantable Lead Location: 753859
Implantable Lead Location: 753860
Implantable Lead Model: 3830
Implantable Lead Model: 5076
Implantable Pulse Generator Implant Date: 20190524
Lead Channel Impedance Value: 418 Ohm
Lead Channel Impedance Value: 437 Ohm
Lead Channel Impedance Value: 513 Ohm
Lead Channel Impedance Value: 627 Ohm
Lead Channel Pacing Threshold Amplitude: 0.75 V
Lead Channel Pacing Threshold Pulse Width: 0.4 ms
Lead Channel Sensing Intrinsic Amplitude: 1.5 mV
Lead Channel Sensing Intrinsic Amplitude: 3.25 mV
Lead Channel Sensing Intrinsic Amplitude: 6 mV
Lead Channel Setting Pacing Amplitude: 2.5 V
Lead Channel Setting Pacing Pulse Width: 0.4 ms
Lead Channel Setting Sensing Sensitivity: 4 mV

## 2021-09-10 ENCOUNTER — Encounter (HOSPITAL_COMMUNITY): Payer: Self-pay

## 2021-09-10 ENCOUNTER — Emergency Department (HOSPITAL_COMMUNITY)
Admission: EM | Admit: 2021-09-10 | Discharge: 2021-09-11 | Disposition: A | Discharge status: Home / Self Care | Payer: Medicare (Managed Care) | Attending: Emergency Medicine | Admitting: Emergency Medicine

## 2021-09-10 ENCOUNTER — Other Ambulatory Visit: Payer: Self-pay

## 2021-09-10 DIAGNOSIS — I5032 Chronic diastolic (congestive) heart failure: Secondary | ICD-10-CM | POA: Diagnosis not present

## 2021-09-10 DIAGNOSIS — E1122 Type 2 diabetes mellitus with diabetic chronic kidney disease: Secondary | ICD-10-CM | POA: Diagnosis not present

## 2021-09-10 DIAGNOSIS — M7918 Myalgia, other site: Secondary | ICD-10-CM | POA: Insufficient documentation

## 2021-09-10 DIAGNOSIS — Z96651 Presence of right artificial knee joint: Secondary | ICD-10-CM | POA: Insufficient documentation

## 2021-09-10 DIAGNOSIS — R778 Other specified abnormalities of plasma proteins: Secondary | ICD-10-CM | POA: Insufficient documentation

## 2021-09-10 DIAGNOSIS — N183 Chronic kidney disease, stage 3 unspecified: Secondary | ICD-10-CM | POA: Diagnosis not present

## 2021-09-10 DIAGNOSIS — Z7984 Long term (current) use of oral hypoglycemic drugs: Secondary | ICD-10-CM | POA: Diagnosis not present

## 2021-09-10 DIAGNOSIS — K922 Gastrointestinal hemorrhage, unspecified: Secondary | ICD-10-CM | POA: Diagnosis not present

## 2021-09-10 DIAGNOSIS — M25512 Pain in left shoulder: Secondary | ICD-10-CM | POA: Insufficient documentation

## 2021-09-10 DIAGNOSIS — Z950 Presence of cardiac pacemaker: Secondary | ICD-10-CM | POA: Diagnosis not present

## 2021-09-10 DIAGNOSIS — Z79899 Other long term (current) drug therapy: Secondary | ICD-10-CM | POA: Insufficient documentation

## 2021-09-10 DIAGNOSIS — Z7982 Long term (current) use of aspirin: Secondary | ICD-10-CM | POA: Diagnosis not present

## 2021-09-10 DIAGNOSIS — I130 Hypertensive heart and chronic kidney disease with heart failure and stage 1 through stage 4 chronic kidney disease, or unspecified chronic kidney disease: Secondary | ICD-10-CM | POA: Diagnosis not present

## 2021-09-10 DIAGNOSIS — I517 Cardiomegaly: Secondary | ICD-10-CM | POA: Diagnosis not present

## 2021-09-10 DIAGNOSIS — I10 Essential (primary) hypertension: Secondary | ICD-10-CM | POA: Diagnosis not present

## 2021-09-10 DIAGNOSIS — E039 Hypothyroidism, unspecified: Secondary | ICD-10-CM | POA: Insufficient documentation

## 2021-09-10 DIAGNOSIS — I4819 Other persistent atrial fibrillation: Secondary | ICD-10-CM | POA: Diagnosis not present

## 2021-09-10 DIAGNOSIS — E871 Hypo-osmolality and hyponatremia: Secondary | ICD-10-CM | POA: Diagnosis not present

## 2021-09-10 DIAGNOSIS — R195 Other fecal abnormalities: Secondary | ICD-10-CM | POA: Diagnosis present

## 2021-09-10 DIAGNOSIS — M25519 Pain in unspecified shoulder: Secondary | ICD-10-CM | POA: Diagnosis not present

## 2021-09-10 DIAGNOSIS — R6889 Other general symptoms and signs: Secondary | ICD-10-CM | POA: Diagnosis not present

## 2021-09-10 DIAGNOSIS — Z043 Encounter for examination and observation following other accident: Secondary | ICD-10-CM | POA: Diagnosis not present

## 2021-09-10 LAB — COMPREHENSIVE METABOLIC PANEL
ALT: 19 U/L (ref 0–44)
AST: 31 U/L (ref 15–41)
Albumin: 3.9 g/dL (ref 3.5–5.0)
Alkaline Phosphatase: 71 U/L (ref 38–126)
Anion gap: 12 (ref 5–15)
BUN: 46 mg/dL — ABNORMAL HIGH (ref 8–23)
CO2: 21 mmol/L — ABNORMAL LOW (ref 22–32)
Calcium: 8.9 mg/dL (ref 8.9–10.3)
Chloride: 94 mmol/L — ABNORMAL LOW (ref 98–111)
Creatinine, Ser: 1.79 mg/dL — ABNORMAL HIGH (ref 0.44–1.00)
GFR, Estimated: 27 mL/min — ABNORMAL LOW (ref >60)
Glucose, Bld: 117 mg/dL — ABNORMAL HIGH (ref 70–99)
Potassium: 4.7 mmol/L (ref 3.5–5.1)
Sodium: 127 mmol/L — ABNORMAL LOW (ref 135–145)
Total Bilirubin: 0.7 mg/dL (ref 0.3–1.2)
Total Protein: 7.5 g/dL (ref 6.5–8.1)

## 2021-09-10 LAB — CBC WITH DIFFERENTIAL/PLATELET
Abs Immature Granulocytes: 0.05 10*3/uL (ref 0.00–0.07)
Basophils Absolute: 0.1 10*3/uL (ref 0.0–0.1)
Basophils Relative: 1 %
Eosinophils Absolute: 0 10*3/uL (ref 0.0–0.5)
Eosinophils Relative: 0 %
HCT: 28.9 % — ABNORMAL LOW (ref 36.0–46.0)
Hemoglobin: 9.5 g/dL — ABNORMAL LOW (ref 12.0–15.0)
Immature Granulocytes: 1 %
Lymphocytes Relative: 16 %
Lymphs Abs: 1.5 10*3/uL (ref 0.7–4.0)
MCH: 27.4 pg (ref 26.0–34.0)
MCHC: 32.9 g/dL (ref 30.0–36.0)
MCV: 83.3 fL (ref 80.0–100.0)
Monocytes Absolute: 0.8 10*3/uL (ref 0.1–1.0)
Monocytes Relative: 9 %
Neutro Abs: 6.9 10*3/uL (ref 1.7–7.7)
Neutrophils Relative %: 73 %
Platelets: 176 10*3/uL (ref 150–400)
RBC: 3.47 MIL/uL — ABNORMAL LOW (ref 3.87–5.11)
RDW: 13.3 % (ref 11.5–15.5)
WBC: 9.3 10*3/uL (ref 4.0–10.5)
nRBC: 0 % (ref 0.0–0.2)

## 2021-09-10 LAB — PROTIME-INR
INR: 1.1 (ref 0.8–1.2)
Prothrombin Time: 14.2 seconds (ref 11.4–15.2)

## 2021-09-10 LAB — TYPE AND SCREEN
ABO/RH(D): AB NEG
Antibody Screen: NEGATIVE

## 2021-09-10 NOTE — ED Provider Triage Note (Signed)
  Emergency Medicine Provider Triage Evaluation Note  MRN:  [id number]  Arrival date & time: 09/10/21    Medically screening exam initiated at 10:16 PM.   CC:   Shoulder Pain and Dark Stools   HPI:  JANITA CAMBEROS is a 88 y.o. year-old female presents to the ED with chief complaint of near syncope.  States that she noticed some blood in her stools this morning.  Has felt lightheaded and felt like she almost passed out earlier today.  She takes a low dose aspirin.  She denies any abdominal pain.  History provided by History provided by patient. ROS:  -As included in HPI PE:   Vitals:   09/10/21 2205  BP: (!) 166/80  Pulse: 75  Resp: (!) 24  Temp: (!) 97.5 F (36.4 C)  SpO2: 99%    Non-toxic appearing No respiratory distress A bit pale appearing MDM:  Based on signs and symptoms, GI bleed is highest on my differential. I ordered labs in triage to expedite lab/diagnostic workup.  Patient was informed that the remainder of the evaluation will be completed by another provider, this initial triage assessment does not replace that evaluation, and the importance of remaining in the ED until their evaluation is complete.    Montine Circle, PA-C 09/10/21 2217

## 2021-09-10 NOTE — ED Triage Notes (Signed)
Pt arrives EMS from home with ongoing left shoulder pain x 1 week.   Pt also sts having Dark stools for a few days.   Pt caregiver called PCP and recommended coming in for evaluation.

## 2021-09-11 ENCOUNTER — Emergency Department (HOSPITAL_COMMUNITY): Payer: Medicare (Managed Care)

## 2021-09-11 ENCOUNTER — Telehealth (HOSPITAL_COMMUNITY): Payer: Self-pay | Admitting: Emergency Medicine

## 2021-09-11 DIAGNOSIS — I517 Cardiomegaly: Secondary | ICD-10-CM | POA: Diagnosis not present

## 2021-09-11 DIAGNOSIS — Z043 Encounter for examination and observation following other accident: Secondary | ICD-10-CM | POA: Diagnosis not present

## 2021-09-11 DIAGNOSIS — M25512 Pain in left shoulder: Secondary | ICD-10-CM | POA: Diagnosis not present

## 2021-09-11 LAB — TROPONIN I (HIGH SENSITIVITY)
Troponin I (High Sensitivity): 40 ng/L — ABNORMAL HIGH (ref <18)
Troponin I (High Sensitivity): 35 ng/L — ABNORMAL HIGH (ref <18)

## 2021-09-11 LAB — BASIC METABOLIC PANEL
Anion gap: 11 (ref 5–15)
BUN: 39 mg/dL — ABNORMAL HIGH (ref 8–23)
CO2: 20 mmol/L — ABNORMAL LOW (ref 22–32)
Calcium: 8.7 mg/dL — ABNORMAL LOW (ref 8.9–10.3)
Chloride: 101 mmol/L (ref 98–111)
Creatinine, Ser: 1.64 mg/dL — ABNORMAL HIGH (ref 0.44–1.00)
GFR, Estimated: 30 mL/min — ABNORMAL LOW (ref >60)
Glucose, Bld: 114 mg/dL — ABNORMAL HIGH (ref 70–99)
Potassium: 4.7 mmol/L (ref 3.5–5.1)
Sodium: 132 mmol/L — ABNORMAL LOW (ref 135–145)

## 2021-09-11 LAB — POC OCCULT BLOOD, ED: Fecal Occult Bld: POSITIVE — AB

## 2021-09-11 MED ORDER — SODIUM CHLORIDE 0.9 % IV BOLUS
500.0000 mL | Freq: Once | INTRAVENOUS | Status: AC
  Administered 2021-09-11: 500 mL via INTRAVENOUS

## 2021-09-11 MED ORDER — ACETAMINOPHEN 325 MG PO TABS
650.0000 mg | ORAL_TABLET | Freq: Once | ORAL | Status: AC
Start: 2021-09-11 — End: 2021-09-11
  Administered 2021-09-11: 650 mg via ORAL
  Filled 2021-09-11: qty 2

## 2021-09-11 MED ORDER — LIDOCAINE 5 % EX PTCH
1.0000 | MEDICATED_PATCH | CUTANEOUS | Status: DC
  Administered 2021-09-11: 1 via TRANSDERMAL
  Filled 2021-09-11: qty 1

## 2021-09-11 MED ORDER — PANTOPRAZOLE SODIUM 20 MG PO TBEC: 20.0000 mg | DELAYED_RELEASE_TABLET | Freq: Every day | ORAL | 0 refills | Status: AC

## 2021-09-11 NOTE — Telephone Encounter (Deleted)
Patient is asking for Rx to be sent to different [facility].

## 2021-09-11 NOTE — ED Notes (Signed)
Per pt, prescriptions need to be picked up at [facility]. RN Placed a call to clinic number online and was informed that this would be the [facility]. Provider notified.

## 2021-09-11 NOTE — ED Provider Notes (Incomplete)
[facility] EMERGENCY DEPARTMENT Provider Note   CSN: [id number] Arrival date & time: 09/10/21  2202     History {Add pertinent medical, surgical, social history, OB history to HPI:1} Chief Complaint  Patient presents with   Shoulder Pain   Dark Stools    Ann Fletcher is a 88 y.o. female.  Patient as above with significant medical history as below, including arthritis, diastolic CHF, CKD, HLD, HTN, A-fib who presents to the ED with complaint of dark-colored stools.  Patient reports for the past 5 days she has been having dark brown stool.  Not described as black.  No abdominal pain.  No prior blood per rectum.  No vomiting or nausea.  During triage she did complain of some left-sided shoulder pain and possible near syncope which it during my assessment she is now denying.  She has not had a bowel movement while in the emergency department.  No diarrhea.  No recent antibiotics.  No blood thinners.  Tolerant p.o. intake without difficulty.     Past Medical History:  Diagnosis Date   Arthritis    hands and back, back spinal stenosis   AV node arrhythmia    a. s/p AV node ablation/PPM 07/2017.   Chronic deafness in right ear    Chronic diastolic CHF (congestive heart failure) ([place])    a. Acute exacerbation occurred in setting of AF.   CKD (chronic kidney disease), stage III ([place])    DM type 2 (diabetes mellitus, type 2) ([place])    Dyspnea    with exertion   Erythropoietin deficiency anemia 08/28/2018   Hyperlipidemia    Hypertension    Hypothyroidism    Lower extremity edema    props up when can swelling then goes down (08-22-2020)   Osteoporosis    Persistent atrial fibrillation ([place])    a. diagnosed in 10/2014 by PCP, underwent failed TEE DCCV on 10/08/14, loaded with amio, successfully DCCV on 10/15/14.   Presence of permanent cardiac pacemaker    Rotator cuff tear since april 2022   Uses walker    Wears dentures    full set    Past Surgical History:   Procedure Laterality Date   AV NODE ABLATION N/A 08/23/2017   Procedure: AV NODE ABLATION;  Surgeon: [name], MD;  Location: [facility];  Service: Cardiovascular;  Laterality: N/A;   BACK SURGERY  2001, 2003, 2006   x 3 lower back has rods and screws in back   BIOPSY  11/06/2017   Procedure: BIOPSY;  Surgeon: [name], MD;  Location: [facility];  Service: Endoscopy;;   CARDIOVERSION N/A 10/08/2014   Procedure: CARDIOVERSION;  Surgeon: [name], MD;  Location: [facility];  Service: Cardiovascular;  Laterality: N/A;   CARDIOVERSION N/A 10/15/2014   Procedure: CARDIOVERSION;  Surgeon: [name], MD;  Location: [facility];  Service: Cardiovascular;  Laterality: N/A;   CARDIOVERSION N/A 11/15/2014   Procedure: CARDIOVERSION;  Surgeon: [name], MD;  Location: [facility];  Service: Cardiovascular;  Laterality: N/A;   COLONOSCOPY N/A 11/06/2017   Procedure: COLONOSCOPY;  Surgeon: [name], MD;  Location: [facility];  Service: Endoscopy;  Laterality: N/A;   ESOPHAGOGASTRODUODENOSCOPY N/A 11/06/2017   Procedure: ESOPHAGOGASTRODUODENOSCOPY (EGD);  Surgeon: [name], MD;  Location: [facility];  Service: Endoscopy;  Laterality: N/A;   GRAFT APPLICATION Bilateral 2/67/1245   Procedure: GRAFT APPLICATION  ACELL;  Surgeon: Edrick Kins, DPM;  Location: [facility];  Service:  Podiatry;  Laterality: Bilateral;   INTRAMEDULLARY (IM) NAIL INTERTROCHANTERIC Right 02/08/2020   Procedure: INTRAMEDULLARY (IM) NAIL INTERTROCHANTRIC;  Surgeon: Hiram Gash, MD;  Location: [facility];  Service: Orthopedics;  Laterality: Right;   PACEMAKER IMPLANT N/A 08/23/2017   Procedure: PACEMAKER IMPLANT;  Surgeon: [name], MD;  Location: [facility];  Service: Cardiovascular;  Laterality: N/A;   POLYPECTOMY  11/06/2017   Procedure: POLYPECTOMY;  Surgeon: [name], MD;  Location: [facility];  Service: Endoscopy;;   REPLACEMENT TOTAL KNEE Right  07/2008   TEE WITHOUT CARDIOVERSION N/A 10/08/2014   Procedure: TRANSESOPHAGEAL ECHOCARDIOGRAM (TEE);  Surgeon: [name], MD;  Location: [facility];  Service: Cardiovascular;  Laterality: N/A;   TUBAL LIGATION  yrs ago   WOUND DEBRIDEMENT Bilateral 08/25/2020   Procedure: DEBRIDEMENT WOUND BILATERAL FEET;  Surgeon: Edrick Kins, DPM;  Location: [facility];  Service: Podiatry;  Laterality: Bilateral;      Shoulder Pain Associated symptoms: no fever        Home Medications Prior to Admission medications   Medication Sig Start Date End Date Taking? Authorizing Provider  acetaminophen (TYLENOL) 500 MG tablet Take 1,000 mg by mouth at bedtime. 11/01/19   [provider]  albuterol (PROVENTIL HFA;VENTOLIN HFA) 108 (90 BASE) MCG/ACT inhaler Inhale 2 puffs into the lungs 3 (three) times daily.    [provider]  aspirin 81 MG chewable tablet Chew 81 mg by mouth daily.    [provider]  atorvastatin (LIPITOR) 40 MG tablet Take 40 mg by mouth at bedtime.    [provider]  Calcium Carbonate-Vitamin D (OSCAL 500/200 D-3 PO) Take 1 tablet by mouth daily.    [provider]  clindamycin (CLEOCIN) 300 MG capsule Take 1 capsule (300 mg total) by mouth 3 (three) times daily. 08/31/20   Edrick Kins, DPM  Cyanocobalamin (VITAMIN B 12 PO) Take 1,000 mcg by mouth daily.    [provider]  fluticasone (FLONASE) 50 MCG/ACT nasal spray Place 1 spray into both nostrils daily as needed for allergies.  11/02/14   [provider]  furosemide (LASIX) 40 MG tablet Take 1.5 tablets (60 mg total) by mouth daily. 07/05/21   Almyra Deforest, PA  gentamicin cream (GARAMYCIN) 0.1 % Apply 1 application topically 2 (two) times daily. 08/22/20   Edrick Kins, DPM  glimepiride (AMARYL) 2 MG tablet Take 2 mg by mouth every morning. 03/30/19   [provider]  HYDROcodone-acetaminophen (NORCO/VICODIN) 5-325 MG tablet Take 1 tablet by mouth  every 4 (four) hours as needed for moderate pain. 08/25/20   Edrick Kins, DPM  Lancet Devices (PRODIGY LANCING DEVICE) MISC See admin instructions. 11/23/19   [provider]  levothyroxine (SYNTHROID, LEVOTHROID) 100 MCG tablet Take 100 mcg by mouth daily before breakfast.  10/28/14   [provider]  ondansetron (ZOFRAN) 4 MG tablet Take 4 mg by mouth every 6 (six) hours as needed for vomiting or nausea/vomiting. 02/15/20   [provider]  Prodigy Twist Top Lancets 28G MISC CHECK BLOOD GLUCOSE (SUGAR) TWICE DAILY 11/23/19   [provider]  vitamin E (VITAMIN E) 180 MG (400 UNITS) capsule Take 400 Units by mouth daily.     [provider]      Allergies    Tizanidine hcl, Nsaids, Pradaxa [dabigatran etexilate mesylate], Sulfa antibiotics, and Betadine [povidone iodine]    Review of Systems   Review of Systems  Constitutional:  Negative for chills  and fever.  HENT:  Negative for facial swelling and trouble swallowing.   Eyes:  Negative for photophobia and visual disturbance.  Respiratory:  Negative for cough and shortness of breath.   Cardiovascular:  Negative for chest pain and palpitations.  Gastrointestinal:  Negative for abdominal pain, nausea and vomiting.       Dark stool  Endocrine: Negative for polydipsia and polyuria.  Genitourinary:  Negative for difficulty urinating and hematuria.  Musculoskeletal:  Positive for arthralgias. Negative for gait problem and joint swelling.  Skin:  Negative for pallor and rash.  Neurological:  Negative for syncope and headaches.       Near syncope  Psychiatric/Behavioral:  Negative for agitation and confusion.     Physical Exam Updated Vital Signs BP (!) 144/69 (BP Location: Right Arm)   Pulse 72   Temp 97.7 F (36.5 C)   Resp 17   Ht '5\' 2"'$  (1.575 m)   Wt 68 kg   SpO2 97%   BMI 27.44 kg/m  Physical Exam Vitals and nursing note reviewed. Exam conducted with a chaperone present.   Constitutional:      General: She is not in acute distress.    Appearance: Normal appearance. She is not ill-appearing.  HENT:     Head: Normocephalic and atraumatic.     Right Ear: External ear normal.     Left Ear: External ear normal.     Nose: Nose normal.     Mouth/Throat:     Mouth: Mucous membranes are moist.  Eyes:     General: No scleral icterus.       Right eye: No discharge.        Left eye: No discharge.  Cardiovascular:     Rate and Rhythm: Normal rate and regular rhythm.     Pulses: Normal pulses.     Heart sounds: Normal heart sounds.  Pulmonary:     Effort: Pulmonary effort is normal. No respiratory distress.     Breath sounds: Normal breath sounds.  Abdominal:     General: Abdomen is flat.     Palpations: Abdomen is soft.     Tenderness: There is no abdominal tenderness. There is no guarding or rebound.  Genitourinary:    Comments: No palpable external or internal hemorrhoids.  No melena.  Brown stool in rectal vault Musculoskeletal:        General: Normal range of motion.     Cervical back: Normal range of motion.       Back:     Right lower leg: No edema.     Left lower leg: No edema.  Skin:    General: Skin is warm and dry.     Capillary Refill: Capillary refill takes less than 2 seconds.     Coloration: Skin is not jaundiced.  Neurological:     Mental Status: She is alert and oriented to person, place, and time. Mental status is at baseline.     GCS: GCS eye subscore is 4. GCS verbal subscore is 5. GCS motor subscore is 6.  Psychiatric:        Mood and Affect: Mood normal.        Behavior: Behavior normal.     ED Results / Procedures / Treatments   Labs (all labs ordered are listed, but only abnormal results are displayed) Labs Reviewed  COMPREHENSIVE METABOLIC PANEL - Abnormal; Notable for the following components:      Result Value   Sodium 127 (*)  Chloride 94 (*)    CO2 21 (*)    Glucose, Bld 117 (*)    BUN 46 (*)    Creatinine,  Ser 1.79 (*)    GFR, Estimated 27 (*)    All other components within normal limits  CBC WITH DIFFERENTIAL/PLATELET - Abnormal; Notable for the following components:   RBC 3.47 (*)    Hemoglobin 9.5 (*)    HCT 28.9 (*)    All other components within normal limits  PROTIME-INR  POC OCCULT BLOOD, ED  TYPE AND SCREEN    EKG None  Radiology No results found.  Procedures Procedures  {Document cardiac monitor, telemetry assessment procedure when appropriate:1}  Medications Ordered in ED Medications - No data to display  ED Course/ Medical Decision Making/ A&P                           Medical Decision Making   CC: Dark stool,?  Near syncope  This patient presents to the Emergency Department for the above complaint. This involves an extensive number of treatment options and is a complaint that carries with it a high risk of complications and morbidity. Vital signs were reviewed. Serious etiologies considered.  DDx includes not limited to LGIB, UGIB, hemorrhoid, AVM, ACS, MSK, other serious etiologies were considered  Record review:  Previous records obtained and reviewed  Prior ED visits, prior labs and imaging, prior echo > Echo 8/19 with LVEF 60-65%.  Unable to evaluate diastolic function.  Moderate tricuspid regurg  Additional history obtained from spouse at bedside  Medical and surgical history as noted above.   Work up as above, notable for:  Labs & imaging results that were available during my care of the patient were visualized by me and considered in my medical decision making.   I ordered imaging studies which included chest and left shoulder x-ray. I visualized the imaging, interpreted images, and I agree with radiologist interpretation. ***  Cardiac monitoring reviewed and interpreted personally which shows ***   Personally discussed patient care with consultant; ***  Management: ***  Reassessment:  ***  Admission was considered.                 Social determinants of health include -  Social History   Socioeconomic History   Marital status: Married    Spouse name: Not on file   Number of children: Not on file   Years of education: Not on file   Highest education level: Not on file  Occupational History   Not on file  Tobacco Use   Smoking status: Never   Smokeless tobacco: Never  Vaping Use   Vaping Use: Never used  Substance and Sexual Activity   Alcohol use: No   Drug use: Never   Sexual activity: Yes    Birth control/protection: Surgical  Other Topics Concern   Not on file  Social History Narrative   Not on file   Social Determinants of Health   Financial Resource Strain: Not on file  Food Insecurity: Not on file  Transportation Needs: Not on file  Physical Activity: Not on file  Stress: Not on file  Social Connections: Not on file  Intimate Partner Violence: Not on file      This chart was dictated using voice recognition software.  Despite best efforts to proofread,  errors can occur which can change the documentation meaning.   {Document critical care time when appropriate:1} {Document review of  labs and clinical decision tools ie heart score, Chads2Vasc2 etc:1}  {Document your independent review of radiology images, and any outside records:1} {Document your discussion with family members, caretakers, and with consultants:1} {Document social determinants of health affecting pt's care:1} {Document your decision making why or why not admission, treatments were needed:1} Final Clinical Impression(s) / ED Diagnoses Final diagnoses:  None    Rx / DC Orders ED Discharge Orders     None

## 2021-09-11 NOTE — Discharge Instructions (Addendum)
It was a pleasure caring for you today in the emergency department. ° °Please return to the emergency department for any worsening or worrisome symptoms. ° ° °

## 2021-09-11 NOTE — Telephone Encounter (Signed)
Disregard this encounter.

## 2021-09-11 NOTE — ED Notes (Signed)
Got patient into a gown on the monitor patient is resting with family at bedside and call bell in reach 

## 2021-09-12 NOTE — Progress Notes (Signed)
Remote pacemaker transmission.   

## 2021-09-13 ENCOUNTER — Telehealth (HOSPITAL_BASED_OUTPATIENT_CLINIC_OR_DEPARTMENT_OTHER): Payer: Self-pay | Admitting: Emergency Medicine

## 2021-09-13 DIAGNOSIS — R778 Other specified abnormalities of plasma proteins: Secondary | ICD-10-CM

## 2021-09-13 NOTE — Telephone Encounter (Signed)
Referral placed.

## 2021-09-21 ENCOUNTER — Ambulatory Visit: Payer: Medicare Other | Admitting: Physician Assistant

## 2021-09-25 ENCOUNTER — Ambulatory Visit: Payer: Medicare Other | Admitting: Physician Assistant

## 2021-09-27 ENCOUNTER — Encounter: Payer: Self-pay | Admitting: Hematology & Oncology

## 2021-10-05 ENCOUNTER — Telehealth: Payer: Self-pay | Admitting: Gastroenterology

## 2021-10-05 NOTE — Telephone Encounter (Signed)
Good Morning Dr Burna Mortimer,  D.O.D for June 19 PM  Inbound call from patient stating she has a referral in for positive fecal occult and anemia. Patient was last seen by Dr Earlean Shawl March of 2021. Patient is wanting transfer of care due to insurance  being out of network. Patients records are in epic. Please review and advise on scheduling.  Than you.

## 2021-10-06 ENCOUNTER — Encounter: Payer: Self-pay | Admitting: Hematology & Oncology

## 2021-10-06 ENCOUNTER — Encounter: Payer: Self-pay | Admitting: Internal Medicine

## 2021-10-06 ENCOUNTER — Ambulatory Visit (INDEPENDENT_AMBULATORY_CARE_PROVIDER_SITE_OTHER): Payer: Medicare (Managed Care) | Admitting: Internal Medicine

## 2021-10-06 VITALS — BP 152/80 | HR 88 | Ht 61.0 in | Wt 151.0 lb

## 2021-10-06 DIAGNOSIS — I5033 Acute on chronic diastolic (congestive) heart failure: Secondary | ICD-10-CM | POA: Diagnosis not present

## 2021-10-06 DIAGNOSIS — I442 Atrioventricular block, complete: Secondary | ICD-10-CM | POA: Diagnosis not present

## 2021-10-06 NOTE — Progress Notes (Signed)
HPI Ann Fletcher returns today for followup of her atrial fib. She is a pleasant 88 yo woman with uncontrolled atrial fib who underwent AV node ablation and insertion of a DDD PM with atrial lead in the his bundle location about 3 years ago. She has done well in the interim. She was noted to have non-capture of her His lead the day after her procedure. To obtain CHB, we had to ablate fairly close to her AV node. 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 She has done well in the interim pacing in the ventricle. She denies palpitations, chest pain or sob. She has mild dyspnea with exertion but she uses a walker. Allergies  Allergen Reactions   Tizanidine Hcl Other (See Comments)    Dizziness, patient reports fall   Nsaids Itching and Other (See Comments)    Stomach cramps    Pradaxa [Dabigatran Etexilate Mesylate] Nausea And Vomiting   Sulfa Antibiotics Nausea And Vomiting   Other Other (See Comments)    Pickles Stomach cramps   Betadine [Povidone Iodine] Itching and Rash    On skin     Current Outpatient Medications  Medication Sig Dispense Refill   Acetaminophen (TYLENOL ARTHRITIS PAIN PO) Take 2 tablets by mouth as needed (pain).     albuterol (PROVENTIL HFA;VENTOLIN HFA) 108 (90 BASE) MCG/ACT inhaler Inhale 2 puffs into the lungs 3 (three) times daily.     aspirin EC 81 MG tablet Take 81 mg by mouth daily. Swallow whole.     atorvastatin (LIPITOR) 40 MG tablet Take 40 mg by mouth at bedtime.     Calcium Carbonate-Vitamin D (OSCAL 500/200 D-3 PO) Take 1 tablet by mouth daily.     Cholecalciferol (VITAMIN D3) 50 MCG (2000 UT) TABS Take 2,000 Units by mouth daily.     clindamycin (CLEOCIN) 300 MG capsule Take 1 capsule (300 mg total) by mouth 3 (three) times daily. 30 capsule 1   Cyanocobalamin (VITAMIN B 12 PO) Take 1,000 mcg by mouth daily.     fluticasone (FLONASE) 50 MCG/ACT nasal spray Place 1 spray into both nostrils daily as needed for allergies.     furosemide  (LASIX) 40 MG tablet Take 1.5 tablets (60 mg total) by mouth daily. 135 tablet 3   gentamicin cream (GARAMYCIN) 0.1 % Apply 1 application topically 2 (two) times daily. 30 g 1   glimepiride (AMARYL) 2 MG tablet Take 2 mg by mouth every morning.     HYDROcodone-acetaminophen (NORCO/VICODIN) 5-325 MG tablet Take 1 tablet by mouth every 4 (four) hours as needed for moderate pain. 30 tablet 0   Lancet Devices (PRODIGY LANCING DEVICE) MISC See admin instructions.     levothyroxine (SYNTHROID) 112 MCG tablet Take 112 mcg by mouth daily before breakfast.     levothyroxine (SYNTHROID, LEVOTHROID) 100 MCG tablet Take 100 mcg by mouth daily before breakfast.     lisinopril (ZESTRIL) 5 MG tablet Take 5 mg by mouth daily.     LORazepam (ATIVAN) 1 MG tablet Take 1 mg by mouth at bedtime as needed for sleep.     ondansetron (ZOFRAN) 4 MG tablet Take 4 mg by mouth every 6 (six) hours as needed for vomiting or nausea/vomiting.     OVER THE COUNTER MEDICATION Take 1 tablet by mouth daily as needed (nausea). Gravol     Prodigy Twist Top Lancets 28G MISC CHECK BLOOD GLUCOSE (SUGAR) TWICE DAILY     vitamin E (VITAMIN E) 180 MG (  400 UNITS) capsule Take 400 Units by mouth daily.      pantoprazole (PROTONIX) 20 MG tablet Take 1 tablet (20 mg total) by mouth daily for 14 days. 14 tablet 0   No current facility-administered medications for this visit.     Past Medical History:  Diagnosis Date   Arthritis    hands and back, back spinal stenosis   AV node arrhythmia    a. s/p AV node ablation/PPM 07/2017.   Chronic deafness in right ear    Chronic diastolic CHF (congestive heart failure) ([place])    a. Acute exacerbation occurred in setting of AF.   CKD (chronic kidney disease), stage III ([place])    DM type 2 (diabetes mellitus, type 2) ([place])    Dyspnea    with exertion   Erythropoietin deficiency anemia 08/28/2018   Hyperlipidemia    Hypertension    Hypothyroidism    Lower extremity edema    props up when can  swelling then goes down (08-22-2020)   Osteoporosis    Persistent atrial fibrillation ([place])    a. diagnosed in 10/2014 by PCP, underwent failed TEE DCCV on 10/08/14, loaded with amio, successfully DCCV on 10/15/14.   Presence of permanent cardiac pacemaker    Rotator cuff tear since april 2022   Uses walker    Wears dentures    full set    ROS:   All systems reviewed and negative except as noted in the HPI.   Past Surgical History:  Procedure Laterality Date   AV NODE ABLATION N/A 08/23/2017   Procedure: AV NODE ABLATION;  Surgeon: [name], MD;  Location: [place];  Service: Cardiovascular;  Laterality: N/A;   BACK SURGERY  2001, 2003, 2006   x 3 lower back has rods and screws in back   BIOPSY  11/06/2017   Procedure: BIOPSY;  Surgeon: [name], MD;  Location: [place];  Service: Endoscopy;;   CARDIOVERSION N/A 10/08/2014   Procedure: CARDIOVERSION;  Surgeon: [name], MD;  Location: [place];  Service: Cardiovascular;  Laterality: N/A;   CARDIOVERSION N/A 10/15/2014   Procedure: CARDIOVERSION;  Surgeon: [name], MD;  Location: [place];  Service: Cardiovascular;  Laterality: N/A;   CARDIOVERSION N/A 11/15/2014   Procedure: CARDIOVERSION;  Surgeon: [name], MD;  Location: [place];  Service: Cardiovascular;  Laterality: N/A;   COLONOSCOPY N/A 11/06/2017   Procedure: COLONOSCOPY;  Surgeon: [name], MD;  Location[facility];  Service: Endoscopy;  Laterality: N/A;   ESOPHAGOGASTRODUODENOSCOPY N/A 11/06/2017   Procedure: ESOPHAGOGASTRODUODENOSCOPY (EGD);  Surgeon: [name], MD;  Location: [facility]OSCOPY;  Service: Endoscopy;  Laterality: N/A;   GRAFT APPLICATION Bilateral 2/35/5732   Procedure: GRAFT APPLICATION  ACELL;  Surgeon: [place] Kins, DPM;  Location: [place];  Service: Podiatry;  Laterality: Bilateral;   INTRAMEDULLARY (IM) NAIL INTERTROCHANTERIC Right 02/08/2020   Procedure: INTRAMEDULLARY (IM) NAIL  INTERTROCHANTRIC;  Surgeon: Hiram Gash, MD;  Location: [facility];  Service: Orthopedics;  Laterality: Right;   PACEMAKER IMPLANT N/A 08/23/2017   Procedure: PACEMAKER IMPLANT;  Surgeon: [name], MD;  Location: [place] [facility];  Service: Cardiovascular;  Laterality: N/A;   POLYPECTOMY  11/06/2017   Procedure: POLYPECTOMY;  Surgeon: [name], MD;  Location: [place];  Service: Endoscopy;;   REPLACEMENT TOTAL KNEE Right 07/2008   TEE WITHOUT CARDIOVERSION N/A 10/08/2014   Procedure: TRANSESOPHAGEAL ECHOCARDIOGRAM (TEE);  Surgeon: [name], MD;  Location: [place];  Service: Cardiovascular;  Laterality: N/A;   TUBAL LIGATION  yrs ago   WOUND DEBRIDEMENT Bilateral 08/25/2020   Procedure: DEBRIDEMENT WOUND BILATERAL FEET;  Surgeon: [place] Kins, DPM;  Location: [place];  Service: Podiatry;  Laterality: Bilateral;     Family History  Problem Relation Age of Onset   Pneumonia Mother    Heart attack Father    Diabetes Sister      Social History   Socioeconomic History   Marital status: Married    Spouse name: Not on file   Number of children: Not on file   Years of education: Not on file   Highest education level: Not on file  Occupational History   Not on file  Tobacco Use   Smoking status: Never   Smokeless tobacco: Never  Vaping Use   Vaping Use: Never used  Substance and Sexual Activity   Alcohol use: No   Drug use: Never   Sexual activity: Yes    Birth control/protection: Surgical  Other Topics Concern   Not on file  Social History Narrative   Not on file   Social Determinants of Health   Financial Resource Strain: Not on file  Food Insecurity: Not on file  Transportation Needs: Not on file  Physical Activity: Not on file  Stress: Not on file  Social Connections: Not on file  Intimate Partner Violence: Not on file     BP (!) 152/80   Pulse 88   Ht '5\' 1"'$  (1.549 m)   Wt 151 lb (68.5 kg)   SpO2 97%   BMI 28.53 kg/m    Physical Exam:  Well appearing NAD HEENT: Unremarkable Neck:  No JVD, no thyromegally Lymphatics:  No adenopathy Back:  No CVA tenderness Lungs:  Clear with no wheezes HEART:  Regular rate rhythm, no murmurs, no rubs, no clicks Abd:  soft, positive bowel sounds, no organomegally, no rebound, no guarding Ext:  2 plus pulses, no edema, no cyanosis, no clubbing Skin:  No rashes no nodules Neuro:  CN II through XII intact, motor grossly intact  DEVICE  Normal device function.  See PaceArt for details.   Assess/Plan:   CHB - she is asymptomatic, s/p PPM insertion. PPM - her medtronic DDD PM is working normally but she is reprogrammed VVIR 70.  Falls - she has not had any since she broke her hip. Chronic diastolic heart failure -her symptoms are class 2.   Carleene Overlie Kemper Heupel,MD

## 2021-10-06 NOTE — Patient Instructions (Signed)
Medication Instructions:  Your physician recommends that you continue on your current medications as directed. Please refer to the Current Medication list given to you today.  *If you need a refill on your cardiac medications before your next appointment, please call your pharmacy*  Lab Work: None ordered.  If you have labs (blood work) drawn today and your tests are completely normal, you will receive your results only by: [place] (if you have MyChart) OR A paper copy in the mail If you have any lab test that is abnormal or we need to change your treatment, we will call you to review the results.  Testing/Procedures: None ordered.  Follow-Up: At [facility], you and your health needs are our priority.  As part of our continuing mission to provide you with exceptional heart care, we have created designated Provider Care Teams.  These Care Teams include your primary Cardiologist (physician) and Advanced Practice Providers (APPs -  Physician Assistants and Nurse Practitioners) who all work together to provide you with the care you need, when you need it.  We recommend signing up for the patient portal called "MyChart".  Sign up information is provided on this After Visit Summary.  MyChart is used to connect with patients for Virtual Visits (Telemedicine).  Patients are able to view lab/test results, encounter notes, upcoming appointments, etc.  Non-urgent messages can be sent to your provider as well.   To learn more about what you can do with MyChart, go to [email or web address].    Your next appointment:   1 year(s)  The format for your next appointment:   In Person  Provider:   [name], MD{or one of the following Advanced Practice Providers on your designated Care Team:   Tommye Standard, [place] Legrand Como "Jonni Sanger" Chalmers Cater, [place]  Remote monitoring is used to monitor your Pacemaker from home. This monitoring reduces the number of office visits required to check your device to  one time per year. It allows [place] to keep an eye on the functioning of your device to ensure it is working properly. You are scheduled for a device check from home on 11/28/2021. You may send your transmission at any time that day. If you have a wireless device, the transmission will be sent automatically. After your physician reviews your transmission, you will receive a postcard with your next transmission date.  Important Information About Sugar

## 2021-10-09 NOTE — Telephone Encounter (Signed)
Please schedule next available appointment with app.  Thank you

## 2021-10-13 ENCOUNTER — Encounter: Payer: Self-pay | Admitting: Physician Assistant

## 2021-10-13 ENCOUNTER — Ambulatory Visit (INDEPENDENT_AMBULATORY_CARE_PROVIDER_SITE_OTHER): Payer: Medicare (Managed Care) | Admitting: Physician Assistant

## 2021-10-13 VITALS — BP 169/83 | HR 89 | Ht 63.0 in | Wt 154.2 lb

## 2021-10-13 DIAGNOSIS — J849 Interstitial pulmonary disease, unspecified: Secondary | ICD-10-CM | POA: Diagnosis not present

## 2021-10-13 DIAGNOSIS — I5033 Acute on chronic diastolic (congestive) heart failure: Secondary | ICD-10-CM

## 2021-10-13 DIAGNOSIS — I482 Chronic atrial fibrillation, unspecified: Secondary | ICD-10-CM

## 2021-10-13 DIAGNOSIS — E119 Type 2 diabetes mellitus without complications: Secondary | ICD-10-CM | POA: Diagnosis not present

## 2021-10-13 DIAGNOSIS — I10 Essential (primary) hypertension: Secondary | ICD-10-CM

## 2021-10-13 DIAGNOSIS — R600 Localized edema: Secondary | ICD-10-CM

## 2021-10-13 DIAGNOSIS — Z9889 Other specified postprocedural states: Secondary | ICD-10-CM

## 2021-10-13 NOTE — Patient Instructions (Signed)
Medication Instructions:  Your physician has recommended you make the following change in your medication: INCREASE: Lasix '80MG'$  daily for 3 days and then DECREASE to '60MG'$  (1.5 tablets) daily.  *If you need a refill on your cardiac medications before your next appointment, please call your pharmacy*   Lab Work: Your physician recommends that you return for lab work in 3 weeks : BMP  If you have labs (blood work) drawn today and your tests are completely normal, you will receive your results only by: [place] (if you have MyChart) OR A paper copy in the mail If you have any lab test that is abnormal or we need to change your treatment, we will call you to review the results.   Testing/Procedures: Your physician has requested that you have a lower extremity venous duplex. This test is an ultrasound of the veins in the legs. It looks at venous blood flow that carries blood from the heart to the legs. Allow one hour for a Lower Venous exam.There are no restrictions or special instructions.     Follow-Up: At [facility], you and your health needs are our priority.  As part of our continuing mission to provide you with exceptional heart care, we have created designated Provider Care Teams.  These Care Teams include your primary Cardiologist (physician) and Advanced Practice Providers (APPs -  Physician Assistants and Nurse Practitioners) who all work together to provide you with the care you need, when you need it.  We recommend signing up for the patient portal called "MyChart".  Sign up information is provided on this After Visit Summary.  MyChart is used to connect with patients for Virtual Visits (Telemedicine).  Patients are able to view lab/test results, encounter notes, upcoming appointments, etc.  Non-urgent messages can be sent to your provider as well.   To learn more about what you can do with MyChart, go to [email or web address].    Your next appointment:   3-4  week(s)  The format for your next appointment:   In Person  Provider:   Almyra Deforest, PA-C       Important Information About Sugar

## 2021-10-13 NOTE — Progress Notes (Unsigned)
Cardiology Office Note:    Date:  10/15/2021   ID:  Ann Fletcher, DOB 09-13-1932, MRN [id number]  PCP:  [facility] Cardiologist:  Quay Burow, MD Electrophysiologist:  [name], MD     Referring MD: Jeanell Sparrow, DO   Chief Complaint  Patient presents with   Follow-up    Seen for [name]    History of Present Illness:    Ann Fletcher is a 88 y.o. female with a hx of HTN, DM, COPD/ILD and atrial fibrillation s/p AVN ablation and PPM 08/23/2017.  She was on amiodarone but developed pulmonary fibrosis and junctional bradycardia.  After she was taken off of amiodarone, she went back into atrial fibrillation with uncontrolled ventricular rate.  She ultimately had AV nodal ablation and pacemaker placement in May 2019.  She had CHF and significant anemia in August 2019.  She was diuresed from 72 kg down to 69.6 kg.  GI work-up did not show obvious sign of bleeding.  The original plan is to consider resuming Eliquis when her hemoglobin stabilized.  She insisted to be discharged on 11/07/2017.  Follow-up hemoglobin continue to show significant anemia and she has declined anticoagulation. She has been followed by Dr. Marin Olp of hematology oncology service, with iron therapy, her anemia finally improved.  She was seen by [name] in December 2020 who felt she was volume overloaded at the time.  Lasix was increased with improvement in his heart weight.  I last saw the patient in February 2023 at which time she denies any chest pain or worsening dyspnea.  She had fine crackles in bilateral bases of the lung related to history of interstitial lung disease.  More recently, she presented to the ED on 09/11/2021 with dark stools for 5 days.  Renal function was stable, sodium was low at 127.  Hemoglobin slightly lower at 9.5.  Fecal occult blood was positive.  Chest x-ray normal.  Shoulder x-ray was obtained for shoulder pain  which was also normal.  Serial troponin 40-->35.  Repeat basic metabolic panel shows sodium 132 and a creatinine stable at 1.64.  Since the ED visit, patient has been seen by Dr. Lovena Le on 10/06/2021 at which time she was doing well.  Patient presents today for follow-up.  She has increased lower extremity edema recently.  According to her daughter, her Lasix has been reduced to 40 mg daily recently by nephrology service.  She has 2+ pitting edema in the left lower extremity, at least 1+ pitting edema in the right lower extremity.  She will need a venous Doppler.  I recommended increase Lasix to 80 mg daily for 3 days before going back to 60 mg daily thereafter.  I have spoken with Claudia Desanctis of [facility] who will adjust her medication next Monday.  She will need a basic metabolic panel to be in 2 weeks.  I plan to see her back in 3 to 4 weeks.   Past Medical History:  Diagnosis Date   Arthritis    hands and back, back spinal stenosis   AV node arrhythmia    a. s/p AV node ablation/PPM 07/2017.   Chronic deafness in right ear    Chronic diastolic CHF (congestive heart failure) ([place])    a. Acute exacerbation occurred in setting of AF.   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 diagnosed in 10/2014 by PCP, underwent failed TEE DCCV on 10/08/14, loaded with amio, successfully DCCV on 10/15/14.   Presence of permanent cardiac pacemaker    Rotator cuff tear since april 2022   Uses walker    Wears dentures    full set    Past Surgical History:  Procedure Laterality Date   AV NODE ABLATION N/A 08/23/2017   Procedure: AV NODE ABLATION;  Surgeon: [name], MD;  Location: [facility];  Service: Cardiovascular;  Laterality: N/A;   BACK SURGERY  2001, 2003, 2006   x 3 lower back has rods and screws in back   BIOPSY  11/06/2017   Procedure: BIOPSY;  Surgeon: [name], MD;  Location: [place];  Service: Endoscopy;;   CARDIOVERSION N/A 10/08/2014   Procedure: CARDIOVERSION;  Surgeon: [name], MD;  Location: [place];  Service: Cardiovascular;  Laterality: N/A;   CARDIOVERSION N/A 10/15/2014   Procedure: CARDIOVERSION;  Surgeon: [name], MD;  Location: [place];  Service: Cardiovascular;  Laterality: N/A;   CARDIOVERSION N/A 11/15/2014   Procedure: CARDIOVERSION;  Surgeon: [name], MD;  Location: [place];  Service: Cardiovascular;  Laterality: N/A;   COLONOSCOPY N/A 11/06/2017   Procedure: COLONOSCOPY;  Surgeon: [name], MD;  Location: [facility];  Service: Endoscopy;  Laterality: N/A;   ESOPHAGOGASTRODUODENOSCOPY N/A 11/06/2017   Procedure: ESOPHAGOGASTRODUODENOSCOPY (EGD);  Surgeon: [name], MD;  Location[facility];  Service: Endoscopy;  Laterality: N/A;   GRAFT APPLICATION Bilateral 9/83/3825   Procedure: GRAFT APPLICATION  ACELL;  Surgeon: Edrick Kins, DPM;  Location: [facility];  Service: Podiatry;  Laterality: Bilateral;   INTRAMEDULLARY (IM) NAIL INTERTROCHANTERIC Right 02/08/2020   Procedure: INTRAMEDULLARY (IM) NAIL INTERTROCHANTRIC;  Surgeon: Hiram Gash, MD;  Location: [facility];  Service: Orthopedics;  Laterality: Right;   PACEMAKER IMPLANT N/A 08/23/2017   Procedure: PACEMAKER IMPLANT;  Surgeon: [name], MD;  Location: [place] [facility];  Service: Cardiovascular;  Laterality: N/A;   POLYPECTOMY  11/06/2017   Procedure: POLYPECTOMY;  Surgeon: [name], MD;  Location: [place];  Service: Endoscopy;;   REPLACEMENT TOTAL KNEE Right 07/2008   TEE WITHOUT CARDIOVERSION N/A 10/08/2014   Procedure: TRANSESOPHAGEAL ECHOCARDIOGRAM (TEE);  Surgeon: [name], MD;  Location: [place];  Service: Cardiovascular;  Laterality: N/A;   TUBAL LIGATION  yrs ago   WOUND DEBRIDEMENT Bilateral 08/25/2020   Procedure: DEBRIDEMENT WOUND BILATERAL FEET;  Surgeon: Edrick Kins, DPM;  Location: [place];  Service: Podiatry;  Laterality: Bilateral;    Current Medications: Current Meds  Medication Sig   Acetaminophen (TYLENOL ARTHRITIS PAIN PO) Take 2 tablets by mouth as needed (pain).   albuterol (PROVENTIL HFA;VENTOLIN HFA) 108 (90 BASE) MCG/ACT inhaler Inhale 2 puffs into the lungs 3 (three) times daily.   aspirin EC 81 MG tablet Take 81 mg by mouth daily. Swallow whole.   atorvastatin (LIPITOR) 40 MG tablet Take 40 mg by mouth at bedtime.   Calcium Carbonate-Vitamin D (OSCAL 500/200 D-3 PO) Take 1 tablet by mouth daily.   Cholecalciferol (VITAMIN D3) 50 MCG (2000 UT) TABS Take 2,000 Units  by mouth daily.   clindamycin (CLEOCIN) 300 MG capsule Take 1 capsule (300 mg total) by mouth 3 (three) times daily.   Cyanocobalamin (VITAMIN B 12 PO) Take 1,000 mcg by mouth daily.   fluticasone (FLONASE) 50 MCG/ACT nasal spray Place 1 spray into both nostrils daily as needed for allergies.   furosemide (LASIX) 40 MG tablet Take 1.5 tablets (60 mg total) by mouth daily.   gentamicin cream (GARAMYCIN) 0.1 % Apply 1 application topically 2 (two) times daily.   glimepiride (AMARYL) 2 MG tablet Take 2 mg by mouth every morning.   Lancet Devices (PRODIGY LANCING DEVICE) MISC See admin instructions.   lisinopril (ZESTRIL) 5 MG tablet Take 5 mg by mouth daily.   LORazepam (ATIVAN) 1 MG tablet Take 1 mg by mouth at bedtime as needed for sleep.   ondansetron (ZOFRAN) 4 MG tablet Take 4 mg by mouth every 6 (six) hours as needed for vomiting or nausea/vomiting.   OVER THE COUNTER MEDICATION Take 1 tablet by mouth daily as needed (nausea). Gravol   Prodigy Twist Top Lancets 28G MISC CHECK BLOOD GLUCOSE (SUGAR) TWICE DAILY   vitamin E (VITAMIN E) 180 MG (400 UNITS) capsule Take  400 Units by mouth daily.      Allergies:   Tizanidine hcl, Nsaids, Pradaxa [dabigatran etexilate mesylate], Sulfa antibiotics, Other, and Betadine [povidone iodine]   Social History   Socioeconomic History   Marital status: Married    Spouse name: Not on file   Number of children: Not on file   Years of education: Not on file   Highest education level: Not on file  Occupational History   Not on file  Tobacco Use   Smoking status: Never   Smokeless tobacco: Never  Vaping Use   Vaping Use: Never used  Substance and Sexual Activity   Alcohol use: No   Drug use: Never   Sexual activity: Yes    Birth control/protection: Surgical  Other Topics Concern   Not on file  Social History Narrative   Not on file   Social Determinants of Health   Financial Resource Strain: Not on file  Food Insecurity: Not on file  Transportation Needs: Not on file  Physical Activity: Not on file  Stress: Not on file  Social Connections: Not on file     Family History: The patient's family history includes Diabetes in her sister; Heart attack in her father; Pneumonia in her mother.  ROS:   Please see the history of present illness.     All other systems reviewed and are negative.  EKGs/Labs/Other Studies Reviewed:    The following studies were reviewed today:  Echo 11/01/2017 LV EF: 60% -   65%   -------------------------------------------------------------------  History:   PMH:  CHF 428.21.  Atrial fibrillation.  Congestive  heart failure.  Risk factors:  AV Node Ablation. CKD. Hypertension.  Diabetes mellitus. Dyslipidemia.   -------------------------------------------------------------------  Study Conclusions   - Left ventricle: The cavity size was normal. There was severe    concentric hypertrophy. Systolic function was normal. The    estimated ejection fraction was in the range of 60% to 65%. Wall    motion was normal; there were no regional wall motion    abnormalities. The  study is not technically sufficient to allow    evaluation of LV diastolic function.  - Aortic valve: Trileaflet; mildly thickened, mildly calcified    leaflets.  - Mitral valve: There was mild regurgitation.  - Right ventricle: The cavity  size was moderately dilated. Wall    thickness was normal. The moderator band was prominent. Pacer    wire or catheter noted in right ventricle. Systolic function was    mildly to moderately reduced.  - Right atrium: The atrium was moderately dilated. Pacer wire or    catheter noted in right atrium.  - Tricuspid valve: There was moderate regurgitation.  - Pericardium, extracardiac: A trivial, free-flowing pericardial    effusion was identified circumferential to the heart. The fluid    had no internal echoes.There was no evidence of hemodynamic    compromise.   EKG:  EKG is not ordered today.    Recent Labs: 09/10/2021: ALT 19; Hemoglobin 9.5; Platelets 176 09/11/2021: BUN 39; Creatinine, Ser 1.64; Potassium 4.7; Sodium 132  Recent Lipid Panel    Component Value Date/Time   CHOL 163 05/23/2021 1226   TRIG 147 05/23/2021 1226   HDL 61 05/23/2021 1226   CHOLHDL 2.7 05/23/2021 1226   LDLCALC 77 05/23/2021 1226     Risk Assessment/Calculations:    CHA2DS2-VASc Score = 6   This indicates a 9.7% annual risk of stroke. The patient's score is based upon: CHF History: 1 HTN History: 1 Diabetes History: 1 Stroke History: 0 Vascular Disease History: 0 Age Score: 2 Gender Score: 1          Physical Exam:    VS:  BP (!) 169/83   Pulse 89   Ht '5\' 3"'$  (1.6 m)   Wt 154 lb 3.2 oz (69.9 kg)   SpO2 98%   BMI 27.32 kg/m       Wt Readings from Last 3 Encounters:  10/13/21 154 lb 3.2 oz (69.9 kg)  10/06/21 151 lb (68.5 kg)  09/10/21 150 lb (68 kg)     GEN:  Well nourished, well developed in no acute distress HEENT: Normal NECK: No JVD; No carotid bruits LYMPHATICS: No lymphadenopathy CARDIAC: RRR, no murmurs, rubs,  gallops RESPIRATORY:  Clear to auscultation without rales, wheezing or rhonchi  ABDOMEN: Soft, non-tender, non-distended MUSCULOSKELETAL:  No edema; No deformity  SKIN: Warm and dry NEUROLOGIC:  Alert and oriented x 3 PSYCHIATRIC:  Normal affect   ASSESSMENT:    1. Acute on chronic diastolic CHF (congestive heart failure) (HCC)   2. Leg edema   3. S/P AV nodal ablation   4. Essential hypertension   5. Controlled type 2 diabetes mellitus without complication, without long-term current use of insulin (HCC)    PLAN:    In order of problems listed above:  Acute on chronic diastolic heart failure: Patient is clearly volume overloaded.  She has 1+ pitting edema on the right, 2+ pitting edema in the left lower extremity.  Per daughter, her diuretic was recently cut back by nephrology service.  I recommended increase Lasix to 80 mg daily for 3 days before going down to 60 mg daily thereafter.  Leg edema: Left lower extremity seems to be more swollen than the right lower extremity, will obtain venous Doppler  History of AV nodal ablation: Underwent AV nodal ablation due to uncontrolled A-fib, pacemaker dependent  Atrial fibrillation: Underwent AV nodal ablation.  Not on anticoagulation given history of recurrent anemia  Hypertension: Blood pressure stable  Hyperlipidemia: On Lipitor  Interstitial lung disease: Followed by pulmonology service.           Medication Adjustments/Labs and Tests Ordered: Current medicines are reviewed at length with the patient today.  Concerns regarding medicines are outlined above.  Orders Placed  This Encounter  Procedures   Basic Metabolic Panel (BMET)   VAS [place] LOWER EXTREMITY VENOUS (DVT)   No orders of the defined types were placed in this encounter.   Patient Instructions  Medication Instructions:  Your physician has recommended you make the following change in your medication: INCREASE: Lasix '80MG'$  daily for 3 days and then DECREASE to '60MG'$   (1.5 tablets) daily.  *If you need a refill on your cardiac medications before your next appointment, please call your pharmacy*   Lab Work: Your physician recommends that you return for lab work in 3 weeks : BMP  If you have labs (blood work) drawn today and your tests are completely normal, you will receive your results only by: [place] (if you have MyChart) OR A paper copy in the mail If you have any lab test that is abnormal or we need to change your treatment, we will call you to review the results.   Testing/Procedures: Your physician has requested that you have a lower extremity venous duplex. This test is an ultrasound of the veins in the legs. It looks at venous blood flow that carries blood from the heart to the legs. Allow one hour for a Lower Venous exam.There are no restrictions or special instructions.     Follow-Up: At [facility], you and your health needs are our priority.  As part of our continuing mission to provide you with exceptional heart care, we have created designated Provider Care Teams.  These Care Teams include your primary Cardiologist (physician) and Advanced Practice Providers (APPs -  Physician Assistants and Nurse Practitioners) who all work together to provide you with the care you need, when you need it.  We recommend signing up for the patient portal called "MyChart".  Sign up information is provided on this After Visit Summary.  MyChart is used to connect with patients for Virtual Visits (Telemedicine).  Patients are able to view lab/test results, encounter notes, upcoming appointments, etc.  Non-urgent messages can be sent to your provider as well.   To learn more about what you can do with MyChart, go to [email or web address].    Your next appointment:   3-4 week(s)  The format for your next appointment:   In Person  Provider:   Almyra Deforest, PA-C       Important Information About Sugar         Hilbert Corrigan, [place]  10/15/2021  11:48 PM    [place]

## 2021-10-15 ENCOUNTER — Encounter: Payer: Self-pay | Admitting: Physician Assistant

## 2021-10-18 ENCOUNTER — Encounter (HOSPITAL_COMMUNITY): Payer: Medicare Other

## 2021-10-24 ENCOUNTER — Ambulatory Visit (HOSPITAL_COMMUNITY)
Admission: RE | Admit: 2021-10-24 | Discharge: 2021-10-24 | Disposition: A | Discharge status: Home / Self Care | Payer: Medicare (Managed Care) | Source: Ambulatory Visit | Attending: Cardiovascular Disease | Admitting: Cardiovascular Disease

## 2021-10-24 DIAGNOSIS — R600 Localized edema: Secondary | ICD-10-CM | POA: Insufficient documentation

## 2021-10-26 ENCOUNTER — Telehealth: Payer: Self-pay | Admitting: Physician Assistant

## 2021-10-26 NOTE — Telephone Encounter (Signed)
Based on report of [place] tech, she has been gaining fluid and weight.  I called and spoke with the patient's daughter.  Her weight has gained up by 1 pound.  Her daughter did not notice patient complains of more abdominal distention recently.  Recent venous Doppler was negative for DVT, however showed possible ascites.  I recommended to the daughter to increase her fluid pill to 80 mg daily for 3 days before going back down to 60 mg daily thereafter.  If she still has abdominal distention afterward, I would recommend a abdominal ultrasound to check the degree of ascites.

## 2021-11-06 ENCOUNTER — Ambulatory Visit (INDEPENDENT_AMBULATORY_CARE_PROVIDER_SITE_OTHER): Payer: Medicare (Managed Care) | Admitting: Podiatry

## 2021-11-06 ENCOUNTER — Encounter: Payer: Self-pay | Admitting: Hematology & Oncology

## 2021-11-06 ENCOUNTER — Other Ambulatory Visit: Payer: Medicare (Managed Care)

## 2021-11-06 ENCOUNTER — Other Ambulatory Visit: Payer: Medicare Other

## 2021-11-06 ENCOUNTER — Ambulatory Visit (INDEPENDENT_AMBULATORY_CARE_PROVIDER_SITE_OTHER): Payer: Medicare (Managed Care)

## 2021-11-06 DIAGNOSIS — M79675 Pain in left toe(s): Secondary | ICD-10-CM

## 2021-11-06 DIAGNOSIS — M79672 Pain in left foot: Secondary | ICD-10-CM

## 2021-11-06 DIAGNOSIS — B351 Tinea unguium: Secondary | ICD-10-CM | POA: Diagnosis not present

## 2021-11-06 DIAGNOSIS — M79674 Pain in right toe(s): Secondary | ICD-10-CM

## 2021-11-06 DIAGNOSIS — I5033 Acute on chronic diastolic (congestive) heart failure: Secondary | ICD-10-CM | POA: Diagnosis not present

## 2021-11-06 NOTE — Progress Notes (Signed)
SUBJECTIVE Patient PMHx diabetes mellitus and CKD stage III presents to office today complaining of elongated, thickened nails that cause pain while ambulating in shoes.  Patient is unable to trim their own nails.  She also has a history of bilateral heel ulcers.  She presents to have them evaluated as well.  Patient states that about 2 weeks ago she began to notice swelling with redness to her left foot and leg.  She was placed on doxycycline and is still taking doxycycline from her PCP.  Patient is here for further evaluation and treatment.  Past Medical History:  Diagnosis Date   Arthritis    hands and back, back spinal stenosis   AV node arrhythmia    a. s/p AV node ablation/PPM 07/2017.   Chronic deafness in right ear    Chronic diastolic CHF (congestive heart failure) ([place])    a. Acute exacerbation occurred in setting of AF.   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 diagnosed in 10/2014 by PCP, underwent failed TEE DCCV on 10/08/14, loaded with amio, successfully DCCV on 10/15/14.   Presence of permanent cardiac pacemaker    Rotator cuff tear since april 2022   Uses walker    Wears dentures    full set    OBJECTIVE General Patient is awake, alert, and oriented x 3 and in no acute distress. Derm Skin is dry and supple bilateral. Negative open lesions or macerations. Remaining integument unremarkable. Nails are tender, long, thickened and dystrophic with subungual debris, consistent with onychomycosis, 1-5 bilateral. No signs of infection noted.  Hyperkeratotic callus also noted to the posterior aspect of the right heel Vasc  DP and PT pedal pulses palpable  bilaterally. Temperature gradient within normal limits.  There is some moderate edema noted left lower extremity with some mild erythema Neuro Epicritic and protective threshold sensation grossly intact bilaterally.  Musculoskeletal Exam No symptomatic pedal deformities noted bilateral. Muscular strength within normal limits.  ASSESSMENT 1.  Pain due to onychomycosis of toenails both 2.  Preulcerative callus lesion posterior aspect of the right heel 3.  Cellulitis with edema left lower extremity  PLAN OF CARE 1. Patient evaluated today.  2. Instructed to maintain good pedal hygiene and foot care.  3. Mechanical debridement of nails 1-5 bilaterally performed using a nail nipper. Filed with dremel without incident.  4.  Excisional debridement of the hyperkeratotic preulcerative callus was performed of the posterior aspect of the right heel  5.  Continue oral doxycycline and management for the left lower extremity cellulitis with her PCP  6.  Foot miracle lotion was dispensed for the patient to apply to the right posterior heel daily  7.  Return to clinic in 3 mos.    Edrick Kins, DPM [facility]  Dr. Edrick Kins, DPM    [address], [place] [address]  Office [phone]  Fax [phone]

## 2021-11-07 ENCOUNTER — Encounter: Payer: Self-pay | Admitting: Hematology & Oncology

## 2021-11-07 ENCOUNTER — Ambulatory Visit: Payer: Medicare Other | Admitting: Physician Assistant

## 2021-11-07 LAB — BASIC METABOLIC PANEL
BUN/Creatinine Ratio: 27 (ref 12–28)
BUN: 53 mg/dL — ABNORMAL HIGH (ref 8–27)
CO2: 23 mmol/L (ref 20–29)
Calcium: 9.2 mg/dL (ref 8.7–10.3)
Chloride: 107 mmol/L — ABNORMAL HIGH (ref 96–106)
Creatinine, Ser: 2 mg/dL — ABNORMAL HIGH (ref 0.57–1.00)
Glucose: 131 mg/dL — ABNORMAL HIGH (ref 70–99)
Potassium: 4.2 mmol/L (ref 3.5–5.2)
Sodium: 143 mmol/L (ref 134–144)
eGFR: 24 mL/min/{1.73_m2} — ABNORMAL LOW (ref >59)

## 2021-11-07 NOTE — Progress Notes (Signed)
Unfortunately, it appears she is getting too dry on the current therapy, will reduce lasix back down to '40mg'$  daily and repeat BMET in 2 weeks.

## 2021-11-10 ENCOUNTER — Other Ambulatory Visit: Payer: Self-pay

## 2021-11-10 DIAGNOSIS — Z79899 Other long term (current) drug therapy: Secondary | ICD-10-CM

## 2021-11-10 MED ORDER — FUROSEMIDE 40 MG PO TABS: 40.0000 mg | ORAL_TABLET | Freq: Every day | ORAL | 3 refills | Status: AC

## 2021-11-17 ENCOUNTER — Other Ambulatory Visit: Payer: Medicare Other

## 2021-11-20 ENCOUNTER — Ambulatory Visit: Payer: Medicare Other | Admitting: Physician Assistant

## 2021-11-28 ENCOUNTER — Ambulatory Visit (INDEPENDENT_AMBULATORY_CARE_PROVIDER_SITE_OTHER): Payer: Medicare (Managed Care)

## 2021-11-28 DIAGNOSIS — I442 Atrioventricular block, complete: Secondary | ICD-10-CM | POA: Diagnosis not present

## 2021-11-28 LAB — CUP PACEART REMOTE DEVICE CHECK
Battery Remaining Longevity: 92 mo
Battery Voltage: 2.99 V
Brady Statistic AP VP Percent: 0 %
Brady Statistic AP VS Percent: 0 %
Brady Statistic AS VP Percent: 99.95 %
Brady Statistic AS VS Percent: 0.05 %
Brady Statistic RA Percent Paced: 0 %
Brady Statistic RV Percent Paced: 99.95 %
Date Time Interrogation Session: 20230829003840
Implantable Lead Implant Date: 20190524
Implantable Lead Implant Date: 20190524
Implantable Lead Location: 753859
Implantable Lead Location: 753860
Implantable Lead Model: 3830
Implantable Lead Model: 5076
Implantable Pulse Generator Implant Date: 20190524
Lead Channel Impedance Value: 342 Ohm
Lead Channel Impedance Value: 437 Ohm
Lead Channel Impedance Value: 513 Ohm
Lead Channel Impedance Value: 513 Ohm
Lead Channel Pacing Threshold Amplitude: 0.875 V
Lead Channel Pacing Threshold Pulse Width: 0.4 ms
Lead Channel Sensing Intrinsic Amplitude: 1.5 mV
Lead Channel Sensing Intrinsic Amplitude: 3.25 mV
Lead Channel Sensing Intrinsic Amplitude: 6 mV
Lead Channel Setting Pacing Amplitude: 2.5 V
Lead Channel Setting Pacing Pulse Width: 0.4 ms
Lead Channel Setting Sensing Sensitivity: 4 mV

## 2021-12-06 ENCOUNTER — Encounter: Payer: Self-pay | Admitting: Physician Assistant

## 2021-12-06 ENCOUNTER — Ambulatory Visit: Payer: Medicare (Managed Care) | Attending: Physician Assistant | Admitting: Nurse Practitioner

## 2021-12-06 VITALS — BP 125/62 | HR 89 | Ht 63.0 in | Wt 148.2 lb

## 2021-12-06 DIAGNOSIS — Z950 Presence of cardiac pacemaker: Secondary | ICD-10-CM | POA: Diagnosis not present

## 2021-12-06 DIAGNOSIS — I4819 Other persistent atrial fibrillation: Secondary | ICD-10-CM

## 2021-12-06 DIAGNOSIS — J849 Interstitial pulmonary disease, unspecified: Secondary | ICD-10-CM | POA: Diagnosis not present

## 2021-12-06 DIAGNOSIS — E785 Hyperlipidemia, unspecified: Secondary | ICD-10-CM

## 2021-12-06 DIAGNOSIS — N184 Chronic kidney disease, stage 4 (severe): Secondary | ICD-10-CM

## 2021-12-06 DIAGNOSIS — D649 Anemia, unspecified: Secondary | ICD-10-CM | POA: Diagnosis not present

## 2021-12-06 DIAGNOSIS — I10 Essential (primary) hypertension: Secondary | ICD-10-CM

## 2021-12-06 DIAGNOSIS — R600 Localized edema: Secondary | ICD-10-CM

## 2021-12-06 DIAGNOSIS — Z9889 Other specified postprocedural states: Secondary | ICD-10-CM

## 2021-12-06 DIAGNOSIS — I5032 Chronic diastolic (congestive) heart failure: Secondary | ICD-10-CM

## 2021-12-06 NOTE — Progress Notes (Signed)
Cardiology Office Note:    Date:  12/06/2021   ID:  Ann Fletcher, DOB 1932/12/02, MRN [id number]  PCP:  [facility] Cardiologist:  Quay Burow, MD Electrophysiologist:  [name], MD     Referring MD: [facility]   CC: Here for follow-up appointment for leg swelling   History of Present Illness:    Ann Fletcher is a 88 y.o. female with a hx of the following:  HTN Chronic diastolic CHF (LVEF 13-24% in 2019) Persistent A-fib Complete heart block, s/p PPM 2019 COPD/Intersistial lung disease, room air T2DM Acquired hypothyroidism Chronic renal impairment, stage 3b HLD Atrial fibrillation, s/p AV nodal ablation Right carotid bruit Vertigo   This patient was on amiodarone therapy for atrial fibrillation, however developed pulmonary fibrosis and junctional bradycardia therefore taken off medication.  She is followed by pulmonology for interstitial lung disease.  Back into A-fib with RVR ultimately underwent AV nodal ablation and permanent pacemaker placement in May 2019.  In August 2019 she had CHF and significant anemia, negative for GI bleed, after discharge from the hospital in 2019 repeat hemoglobin continue to show significant anemia and she declined anticoagulation for atrial fibrillation.  She is followed by heme-onc and has received iron therapy, anemia did improve.  Presented to the emergency department on September 11, 2021 with chief complaint of dark stools for 5 days, sodium 127, renal function stable, hemoglobin 9.5, positive Hemoccult.  Normal chest x-ray.  Serial troponin 40-35.  Repeat sodium 132, creatinine stable at 1.64.  Last seen by Dr. Lovena Le and was doing well on October 06, 2021.  Last seen by Almyra Deforest, PA on October 13, 2021, she stated she had increased lower extremity swelling.  Lasix had been reduced to 40 mg daily as recommended by her nephrologist.  Venous Doppler arranged for  her revealed no evidence of DVT noted, however Baker's cyst noted in left popliteal fossa.At that visit, pt was recommended to increase Lasix to 80 mg daily x3 days before going back to 60 mg daily thereafter, repeat BMET was recommended to be obtained in 2 weeks after increasing Lasix, however looks like this has not been obtained.   Today she presents for 1 month follow-up. She states her swelling has significantly improved and she is taking 60 mg of Lasix daily.  Denies any chest pain, shortness of breath, worsening swelling, significant weight changes, syncope, presyncope, dizziness, falls, orthopnea, PND, claudication, or palpitations. Overall she is doing well from a cardiac perspective.   Past Medical History:  Diagnosis Date   Arthritis    hands and back, back spinal stenosis   AV node arrhythmia    a. s/p AV node ablation/PPM 07/2017.   Chronic deafness in right ear    Chronic diastolic CHF (congestive heart failure) ([place])    a. Acute exacerbation occurred in setting of AF.   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 diagnosed in 10/2014 by PCP, underwent failed TEE DCCV on 10/08/14, loaded with amio, successfully DCCV on 10/15/14.  Presence of permanent cardiac pacemaker    Rotator cuff tear since april 2022   Uses walker    Wears dentures    full set    Past Surgical History:  Procedure Laterality Date   AV NODE ABLATION N/A 08/23/2017   Procedure: AV NODE ABLATION;  Surgeon: [name], MD;  Location: [facility];  Service: Cardiovascular;  Laterality: N/A;   BACK SURGERY  2001, 2003, 2006   x 3 lower back has rods and screws in back   BIOPSY   11/06/2017   Procedure: BIOPSY;  Surgeon: [name], MD;  Location: [facility];  Service: Endoscopy;;   CARDIOVERSION N/A 10/08/2014   Procedure: CARDIOVERSION;  Surgeon: [name], MD;  Location: [facility];  Service: Cardiovascular;  Laterality: N/A;   CARDIOVERSION N/A 10/15/2014   Procedure: CARDIOVERSION;  Surgeon: [name], MD;  Location: [facility] [place];  Service: Cardiovascular;  Laterality: N/A;   CARDIOVERSION N/A 11/15/2014   Procedure: CARDIOVERSION;  Surgeon: [name], MD;  Location: [facility];  Service: Cardiovascular;  Laterality: N/A;   COLONOSCOPY N/A 11/06/2017   Procedure: COLONOSCOPY;  Surgeon: [name], MD;  Location: [facility];  Service: Endoscopy;  Laterality: N/A;   ESOPHAGOGASTRODUODENOSCOPY N/A 11/06/2017   Procedure: ESOPHAGOGASTRODUODENOSCOPY (EGD);  Surgeon: [name], MD;  Location: [facility];  Service: Endoscopy;  Laterality: N/A;   GRAFT APPLICATION Bilateral 07/18/4079   Procedure: GRAFT APPLICATION  ACELL;  Surgeon: Edrick Kins, DPM;  Location: [facility];  Service: Podiatry;  Laterality: Bilateral;   INTRAMEDULLARY (IM) NAIL INTERTROCHANTERIC Right 02/08/2020   Procedure: INTRAMEDULLARY (IM) NAIL INTERTROCHANTRIC;  Surgeon: Hiram Gash, MD;  Location: [facility];  Service: Orthopedics;  Laterality: Right;   PACEMAKER IMPLANT N/A 08/23/2017   Procedure: PACEMAKER IMPLANT;  Surgeon: [name], MD;  Location: [place] [facility];  Service: Cardiovascular;  Laterality: N/A;   POLYPECTOMY  11/06/2017   Procedure: POLYPECTOMY;  Surgeon: [name], MD;  Location: [place];  Service: Endoscopy;;   REPLACEMENT TOTAL KNEE Right 07/2008   TEE WITHOUT CARDIOVERSION N/A 10/08/2014   Procedure: TRANSESOPHAGEAL ECHOCARDIOGRAM (TEE);  Surgeon: [name], MD;  Location: [place];  Service: Cardiovascular;  Laterality: N/A;   TUBAL LIGATION  yrs ago   WOUND DEBRIDEMENT Bilateral 08/25/2020   Procedure: DEBRIDEMENT  WOUND BILATERAL FEET;  Surgeon: Edrick Kins, DPM;  Location: [place];  Service: Podiatry;  Laterality: Bilateral;    Current Medications: Current Meds  Medication Sig   Acetaminophen (TYLENOL ARTHRITIS PAIN PO) Take 2 tablets by mouth as needed (pain).   albuterol (PROVENTIL HFA;VENTOLIN HFA) 108 (90 BASE) MCG/ACT inhaler Inhale 2 puffs into the lungs 3 (three) times daily.   aspirin EC 81 MG tablet Take 81 mg by mouth daily. Swallow whole.   atorvastatin (LIPITOR) 40 MG tablet Take 40 mg by mouth at bedtime.   Calcium Carbonate-Vitamin D (OSCAL 500/200 D-3 PO) Take 1 tablet by mouth daily.   Cholecalciferol (VITAMIN D3) 50 MCG (2000 UT) TABS Take 2,000 Units by mouth daily.   clindamycin (CLEOCIN) 300 MG capsule Take 1 capsule (300 mg total) by mouth 3 (three) times daily.   Cyanocobalamin (VITAMIN B 12 PO) Take 1,000 mcg by mouth daily.   fluticasone (FLONASE) 50 MCG/ACT nasal spray Place 1 spray into both nostrils daily as needed for allergies.   furosemide (LASIX) 40 MG tablet Take 1 tablet (40 mg total) by mouth daily.   gentamicin cream (GARAMYCIN)  0.1 % Apply 1 application topically 2 (two) times daily.   glimepiride (AMARYL) 2 MG tablet Take 2 mg by mouth every morning.   HYDROcodone-acetaminophen (NORCO/VICODIN) 5-325 MG tablet Take 1 tablet by mouth every 4 (four) hours as needed for moderate pain.   Lancet Devices (PRODIGY LANCING DEVICE) MISC See admin instructions.   levothyroxine (SYNTHROID) 112 MCG tablet Take 112 mcg by mouth daily before breakfast.   levothyroxine (SYNTHROID, LEVOTHROID) 100 MCG tablet Take 100 mcg by mouth daily before breakfast.   lisinopril (ZESTRIL) 5 MG tablet Take 5 mg by mouth daily.   LORazepam (ATIVAN) 1 MG tablet Take 1 mg by mouth at bedtime as needed for sleep.   ondansetron (ZOFRAN) 4 MG tablet Take 4 mg by mouth every 6 (six) hours as needed for vomiting or nausea/vomiting.   OVER THE COUNTER MEDICATION Take 1 tablet by  mouth daily as needed (nausea). Gravol   pantoprazole (PROTONIX) 20 MG tablet Take 1 tablet (20 mg total) by mouth daily for 14 days.   Prodigy Twist Top Lancets 28G MISC CHECK BLOOD GLUCOSE (SUGAR) TWICE DAILY   vitamin E (VITAMIN E) 180 MG (400 UNITS) capsule Take 400 Units by mouth daily.      Allergies:   Tizanidine hcl, Nsaids, Pradaxa [dabigatran etexilate mesylate], Sulfa antibiotics, Other, and Betadine [povidone iodine]   Social History   Socioeconomic History   Marital status: Married    Spouse name: Not on file   Number of children: Not on file   Years of education: Not on file   Highest education level: Not on file  Occupational History   Not on file  Tobacco Use   Smoking status: Never   Smokeless tobacco: Never  Vaping Use   Vaping Use: Never used  Substance and Sexual Activity   Alcohol use: No   Drug use: Never   Sexual activity: Yes    Birth control/protection: Surgical  Other Topics Concern   Not on file  Social History Narrative   Not on file   Social Determinants of Health   Financial Resource Strain: Not on file  Food Insecurity: Not on file  Transportation Needs: Not on file  Physical Activity: Not on file  Stress: Not on file  Social Connections: Not on file     Family History: The patient's family history includes Diabetes in her sister; Heart attack in her father; Pneumonia in her mother.  ROS:   Review of Systems  Constitutional: Negative.   HENT: Negative.    Eyes: Negative.   Respiratory:  Positive for cough and shortness of breath. Negative for hemoptysis, sputum production and wheezing.        Chronic, stable-see HPI.  Cardiovascular:  Positive for leg swelling.       Improved, see HPI.  Gastrointestinal: Negative.   Genitourinary: Negative.   Musculoskeletal: Negative.   Skin: Negative.        Some bruising on left forearm from past blood draw.   Neurological: Negative.   Endo/Heme/Allergies:  Negative for environmental  allergies and polydipsia. Bruises/bleeds easily.  Psychiatric/Behavioral: Negative.     Please see the history of present illness.    All other systems reviewed and are negative.  EKGs/Labs/Other Studies Reviewed:    The following studies were reviewed today:   EKG:  EKG is not ordered today.    Vascular ultrasound lower extremity venous bilateral on October 24, 2021: BILATERAL:  - No evidence of deep vein thrombosis seen in the lower extremities,  bilaterally. Of note, increased respiratory pulsatility in the femoral  veins with incidental finding of abdominal ascites. Patient indicates she  has been gaining weight also.  -  RIGHT:  - No cystic structure found in the popliteal fossa.    LEFT:  - A cystic structure is found in the popliteal fossa.    - 3.0 x 2.3 x .7 cm popiteal fossa cyst.   2D echocardiogram on November 01, 2017: - Left ventricle: The cavity size was normal. There was severe    concentric hypertrophy. Systolic function was normal. The    estimated ejection fraction was in the range of 60% to 65%. Wall    motion was normal; there were no regional wall motion    abnormalities. The study is not technically sufficient to allow    evaluation of LV diastolic function.  - Aortic valve: Trileaflet; mildly thickened, mildly calcified    leaflets.  - Mitral valve: There was mild regurgitation.  - Right ventricle: The cavity size was moderately dilated. Wall    thickness was normal. The moderator band was prominent. Pacer    wire or catheter noted in right ventricle. Systolic function was    mildly to moderately reduced.  - Right atrium: The atrium was moderately dilated. Pacer wire or    catheter noted in right atrium.  - Tricuspid valve: There was moderate regurgitation.  - Pericardium, extracardiac: A trivial, free-flowing pericardial    effusion was identified circumferential to the heart. The fluid    had no internal echoes.There was no evidence of hemodynamic     compromise.   AV nodal ablation on Aug 23, 2017:  1. Successful implantation of a Medtronic dual-chamber pacemaker for symptomatic bradycardia due to CHB after AV node ablation for the treatment of uncontrolled atrial fib.  2. No early apparent complications.   Myoview on March 10, 2014: Normal stress nuclear study.     Recent Labs: 09/10/2021: ALT 19; Hemoglobin 9.5; Platelets 176 11/06/2021: BUN 53; Creatinine, Ser 2.00; Potassium 4.2; Sodium 143  Recent Lipid Panel    Component Value Date/Time   CHOL 163 05/23/2021 1226   TRIG 147 05/23/2021 1226   HDL 61 05/23/2021 1226   CHOLHDL 2.7 05/23/2021 1226   LDLCALC 77 05/23/2021 1226     Risk Assessment/Calculations:    CHA2DS2-VASc Score = 6  This indicates a 9.7% annual risk of stroke. The patient's score is based upon: CHF History: 1 HTN History: 1 Diabetes History: 1 Stroke History: 0 Vascular Disease History: 0 Age Score: 2 Gender Score: 1     Physical Exam:    VS:  BP 125/62 (BP Location: Left Arm, Patient Position: Sitting, Cuff Size: Normal)   Pulse 89   Ht '5\' 3"'  (1.6 m)   Wt 148 lb 3.2 oz (67.2 kg)   SpO2 97%   BMI 26.25 kg/m     Wt Readings from Last 3 Encounters:  12/06/21 148 lb 3.2 oz (67.2 kg)  10/13/21 154 lb 3.2 oz (69.9 kg)  10/06/21 151 lb (68.5 kg)     Vitals:   12/06/21 1409 12/06/21 1420  BP: (!) 144/68 125/62  Pulse: 89   SpO2: 97%     GEN: Well nourished, well developed in no acute distress HEENT: Normal NECK: No JVD; No carotid bruits CARDIAC: RRR, no murmurs, rubs, gallops; 2+ peripheral pulses throughout, strong and equal bilaterally RESPIRATORY:  Clear managed to auscultation without rales, wheezing or rhonchi, nonproductive cough noted on exam ABDOMEN: Soft, non-tender, non-distended  MUSCULOSKELETAL:  Minimal +1 edema bilaterally along lower extremities; thoracic kyphosis noted, generalized weakness and walks with Rolator SKIN: Warm and dry, thin skin, small bruise noted  to left forearm from previous blood draw NEUROLOGIC:  Alert and oriented x 3 PSYCHIATRIC:  Normal affect   ASSESSMENT:    1. Chronic diastolic CHF (congestive heart failure) (HCC)   2. Leg edema   3. Persistent atrial fibrillation ([place])   4. S/P AV nodal ablation   5. S/P placement of cardiac pacemaker   6. Chronic renal impairment, stage 3 (moderate), unspecified whether stage 3a or 3b CKD ([place])   7. Hypertension, unspecified type   8. Interstitial lung disease ([place])   9. Hyperlipidemia, unspecified hyperlipidemia type    PLAN:    In order of problems listed above:  Chronic diastolic CHF, leg edema Improvement along LLE swelling. She has lost weight since last office visit on 10/13/21. Will reduce Lasix to 40 mg daily as recommended by Almyra Deforest, PA-C previously after repeat BMET. Low sodium diet, fluid restriction <2L, and daily weights encouraged. Educated to contact our office for weight gain of 2 lbs overnight or 5 lbs in one week. Recommended compression stockings and elevating feet at rest. She defers repeat bloodwork today to recheck kidney function, daughter says she sees kidney doctor on Monday and is agreeable to defer repeat BMET next Monday.  A-fib, History of AV nodal ablation, PPM - chronic, stable Denies any tachypalpitations. Underwent AV nodal ablation d/t hx of uncontrolled A-fib. She underwent PPM placement. Not on any anticoagulation d/t history of significant and recurrent anemia. Continue to follow EP.   Chronic Kidney disease stage 4 Sees Nephrology. Has an appointment for next Monday.  Repeat BMET from November 06, 2021 revealed worsening serum creatinine at 2 with eGFR at 24.  Baseline serum creatinine is 1.64-1.80. We will reduce Lasix to 40 mg daily as mentioned above.  She refuses repeat blood draw (BMET) today and will defer this to her nephrologist for her appointment next Monday.   Anemia - chronic, stable Stable hemoglobin in June 2023, 9.5 and hematocrit  28.9.  This is being managed by heme-onc and PCP.  We will try to obtain records from [facility] regarding repeat blood work as mentioned by daughter who is present for visit.   Hyperlipidemia - chronic, stable Last LDL on file was 77 in 05/2021.  Continue Lipitor management.  HTN- chronic, stable Initial blood pressure on arrival was 144/68, repeat manual was 125/62. Discussed to monitor BP at home at least 2 hours after medications and sitting for 5-10 minutes. Recommended Omron cuff for home monitoring.  Discussed to notify cardiology services if systolic blood pressure is greater than 130 consistently.  Patient and daughter verbalized understanding.  Interstitial lung disease - chronic, stable Symptoms are chronic and stable as mentioned above.  Denies any new or worsening symptoms.  She is being followed by pulmonology services.  8. Disposition: Follow-up with [name] in 3 months or sooner if needed.     Medication Adjustments/Labs and Tests Ordered: Current medicines are reviewed at length with the patient today.  Concerns regarding medicines are outlined above.  No orders of the defined types were placed in this encounter.  No orders of the defined types were placed in this encounter.   Patient Instructions  Medication Instructions:  Your physician recommends that you continue on your current medications as directed. Please refer to the Current Medication list given to you today.  *If you need  a refill on your cardiac medications before your next appointment, please call your pharmacy*  Lab Work: NONE ordered at this time of appointment   If you have labs (blood work) drawn today and your tests are completely normal, you will receive your results only by: [place] (if you have MyChart) OR A paper copy in the mail If you have any lab test that is abnormal or we need to change your treatment, we will call you to review the results.  Testing/Procedures: NONE ordered at  this time of appointment   Follow-Up: At [facility], you and your health needs are our priority.  As part of our continuing mission to provide you with exceptional heart care, we have created designated Provider Care Teams.  These Care Teams include your primary Cardiologist (physician) and Advanced Practice Providers (APPs -  Physician Assistants and Nurse Practitioners) who all work together to provide you with the care you need, when you need it.  Your next appointment:   3 month(s)  The format for your next appointment:   In Person  Provider:   Quay Burow, MD     Other Instructions Heart Failure Education: Weigh yourself EVERY morning after you go to the bathroom but before you eat or drink anything. Write this number down in a weight log/diary. If you gain 3 pounds overnight or 5 pounds in a week, call the office. Take your medicines as prescribed. If you have concerns about your medications, please call us before you stop taking them.  Eat low salt foods--Limit salt (sodium) to 2000 mg per day. This will help prevent your body from holding onto fluid. Read food labels as many processed foods have a lot of sodium, especially canned goods and prepackaged meats. If you would like some assistance choosing low sodium foods, we would be happy to set you up with a nutritionist. Stay as active as you can everyday. Staying active will give you more energy and make your muscles stronger. Start with 5 minutes at a time and work your way up to 30 minutes a day. Break up your activities--do some in the morning and some in the afternoon. Start with 3 days per week and work your way up to 5 days as you can.  If you have chest pain, feel short of breath, dizzy, or lightheaded, STOP. If you don't feel better after a short rest, call 911. If you do feel better, call the office to let us know you have symptoms with exercise. Limit all fluids for the day to less than 2 liters. Fluid includes all  drinks, coffee, juice, ice chips, soup, jello, and all other liquids.  Important Information About Sugar         Signed, Finis Bud, NP  12/06/2021 9:39 PM    [place]

## 2021-12-06 NOTE — Patient Instructions (Signed)
Medication Instructions:  Your physician recommends that you continue on your current medications as directed. Please refer to the Current Medication list given to you today.  *If you need a refill on your cardiac medications before your next appointment, please call your pharmacy*  Lab Work: NONE ordered at this time of appointment   If you have labs (blood work) drawn today and your tests are completely normal, you will receive your results only by: [place] (if you have MyChart) OR A paper copy in the mail If you have any lab test that is abnormal or we need to change your treatment, we will call you to review the results.  Testing/Procedures: NONE ordered at this time of appointment   Follow-Up: At [facility], you and your health needs are our priority.  As part of our continuing mission to provide you with exceptional heart care, we have created designated Provider Care Teams.  These Care Teams include your primary Cardiologist (physician) and Advanced Practice Providers (APPs -  Physician Assistants and Nurse Practitioners) who all work together to provide you with the care you need, when you need it.  Your next appointment:   3 month(s)  The format for your next appointment:   In Person  Provider:   Quay Burow, MD     Other Instructions Heart Failure Education: Weigh yourself EVERY morning after you go to the bathroom but before you eat or drink anything. Write this number down in a weight log/diary. If you gain 3 pounds overnight or 5 pounds in a week, call the office. Take your medicines as prescribed. If you have concerns about your medications, please call us before you stop taking them.  Eat low salt foods--Limit salt (sodium) to 2000 mg per day. This will help prevent your body from holding onto fluid. Read food labels as many processed foods have a lot of sodium, especially canned goods and prepackaged meats. If you would like some assistance  choosing low sodium foods, we would be happy to set you up with a nutritionist. Stay as active as you can everyday. Staying active will give you more energy and make your muscles stronger. Start with 5 minutes at a time and work your way up to 30 minutes a day. Break up your activities--do some in the morning and some in the afternoon. Start with 3 days per week and work your way up to 5 days as you can.  If you have chest pain, feel short of breath, dizzy, or lightheaded, STOP. If you don't feel better after a short rest, call 911. If you do feel better, call the office to let us know you have symptoms with exercise. Limit all fluids for the day to less than 2 liters. Fluid includes all drinks, coffee, juice, ice chips, soup, jello, and all other liquids.  Important Information About Sugar

## 2021-12-22 NOTE — Progress Notes (Signed)
Remote pacemaker transmission.   

## 2021-12-25 ENCOUNTER — Encounter: Payer: Self-pay | Admitting: Hematology & Oncology

## 2021-12-28 ENCOUNTER — Inpatient Hospital Stay (HOSPITAL_COMMUNITY)
Admission: EM | Admit: 2021-12-28 | Discharge: 2022-01-08 | DRG: 177 | Disposition: A | Discharge status: Skilled Nursing Facility | Payer: Medicare Other | Attending: Internal Medicine | Admitting: Internal Medicine

## 2021-12-28 ENCOUNTER — Encounter (HOSPITAL_COMMUNITY): Payer: Self-pay | Admitting: Emergency Medicine

## 2021-12-28 ENCOUNTER — Other Ambulatory Visit: Payer: Self-pay

## 2021-12-28 ENCOUNTER — Emergency Department (HOSPITAL_COMMUNITY): Payer: Medicare Other

## 2021-12-28 DIAGNOSIS — N184 Chronic kidney disease, stage 4 (severe): Secondary | ICD-10-CM | POA: Diagnosis present

## 2021-12-28 DIAGNOSIS — D509 Iron deficiency anemia, unspecified: Secondary | ICD-10-CM | POA: Diagnosis present

## 2021-12-28 DIAGNOSIS — M19041 Primary osteoarthritis, right hand: Secondary | ICD-10-CM | POA: Diagnosis present

## 2021-12-28 DIAGNOSIS — M810 Age-related osteoporosis without current pathological fracture: Secondary | ICD-10-CM | POA: Diagnosis present

## 2021-12-28 DIAGNOSIS — J849 Interstitial pulmonary disease, unspecified: Secondary | ICD-10-CM | POA: Diagnosis present

## 2021-12-28 DIAGNOSIS — J441 Chronic obstructive pulmonary disease with (acute) exacerbation: Secondary | ICD-10-CM | POA: Diagnosis not present

## 2021-12-28 DIAGNOSIS — H9191 Unspecified hearing loss, right ear: Secondary | ICD-10-CM | POA: Diagnosis present

## 2021-12-28 DIAGNOSIS — J690 Pneumonitis due to inhalation of food and vomit: Secondary | ICD-10-CM | POA: Diagnosis present

## 2021-12-28 DIAGNOSIS — E785 Hyperlipidemia, unspecified: Secondary | ICD-10-CM | POA: Diagnosis present

## 2021-12-28 DIAGNOSIS — U071 COVID-19: Principal | ICD-10-CM | POA: Diagnosis present

## 2021-12-28 DIAGNOSIS — E611 Iron deficiency: Secondary | ICD-10-CM | POA: Diagnosis present

## 2021-12-28 DIAGNOSIS — E1122 Type 2 diabetes mellitus with diabetic chronic kidney disease: Secondary | ICD-10-CM | POA: Diagnosis present

## 2021-12-28 DIAGNOSIS — E039 Hypothyroidism, unspecified: Secondary | ICD-10-CM | POA: Diagnosis present

## 2021-12-28 DIAGNOSIS — Z882 Allergy status to sulfonamides status: Secondary | ICD-10-CM

## 2021-12-28 DIAGNOSIS — Z950 Presence of cardiac pacemaker: Secondary | ICD-10-CM

## 2021-12-28 DIAGNOSIS — E1165 Type 2 diabetes mellitus with hyperglycemia: Secondary | ICD-10-CM | POA: Diagnosis present

## 2021-12-28 DIAGNOSIS — Z8249 Family history of ischemic heart disease and other diseases of the circulatory system: Secondary | ICD-10-CM

## 2021-12-28 DIAGNOSIS — Z833 Family history of diabetes mellitus: Secondary | ICD-10-CM

## 2021-12-28 DIAGNOSIS — M19042 Primary osteoarthritis, left hand: Secondary | ICD-10-CM | POA: Diagnosis present

## 2021-12-28 DIAGNOSIS — Z888 Allergy status to other drugs, medicaments and biological substances status: Secondary | ICD-10-CM

## 2021-12-28 DIAGNOSIS — D631 Anemia in chronic kidney disease: Secondary | ICD-10-CM | POA: Diagnosis present

## 2021-12-28 DIAGNOSIS — I130 Hypertensive heart and chronic kidney disease with heart failure and stage 1 through stage 4 chronic kidney disease, or unspecified chronic kidney disease: Secondary | ICD-10-CM | POA: Diagnosis present

## 2021-12-28 DIAGNOSIS — Z972 Presence of dental prosthetic device (complete) (partial): Secondary | ICD-10-CM

## 2021-12-28 DIAGNOSIS — M479 Spondylosis, unspecified: Secondary | ICD-10-CM | POA: Diagnosis present

## 2021-12-28 DIAGNOSIS — I5032 Chronic diastolic (congestive) heart failure: Secondary | ICD-10-CM | POA: Diagnosis present

## 2021-12-28 LAB — CBC WITH DIFFERENTIAL/PLATELET
Abs Immature Granulocytes: 0.04 10*3/uL (ref 0.00–0.07)
Basophils Absolute: 0.1 10*3/uL (ref 0.0–0.1)
Basophils Relative: 1 %
Eosinophils Absolute: 0 10*3/uL (ref 0.0–0.5)
Eosinophils Relative: 0 %
HCT: 30 % — ABNORMAL LOW (ref 36.0–46.0)
Hemoglobin: 9.3 g/dL — ABNORMAL LOW (ref 12.0–15.0)
Immature Granulocytes: 1 %
Lymphocytes Relative: 8 %
Lymphs Abs: 0.7 10*3/uL (ref 0.7–4.0)
MCH: 24.2 pg — ABNORMAL LOW (ref 26.0–34.0)
MCHC: 31 g/dL (ref 30.0–36.0)
MCV: 77.9 fL — ABNORMAL LOW (ref 80.0–100.0)
Monocytes Absolute: 0.9 10*3/uL (ref 0.1–1.0)
Monocytes Relative: 11 %
Neutro Abs: 6.6 10*3/uL (ref 1.7–7.7)
Neutrophils Relative %: 79 %
Platelets: 143 10*3/uL — ABNORMAL LOW (ref 150–400)
RBC: 3.85 MIL/uL — ABNORMAL LOW (ref 3.87–5.11)
RDW: 17.9 % — ABNORMAL HIGH (ref 11.5–15.5)
WBC: 8.2 10*3/uL (ref 4.0–10.5)
nRBC: 0 % (ref 0.0–0.2)

## 2021-12-28 LAB — COMPREHENSIVE METABOLIC PANEL
ALT: 12 U/L (ref 0–44)
AST: 25 U/L (ref 15–41)
Albumin: 4 g/dL (ref 3.5–5.0)
Alkaline Phosphatase: 65 U/L (ref 38–126)
Anion gap: 11 (ref 5–15)
BUN: 39 mg/dL — ABNORMAL HIGH (ref 8–23)
CO2: 26 mmol/L (ref 22–32)
Calcium: 9.4 mg/dL (ref 8.9–10.3)
Chloride: 102 mmol/L (ref 98–111)
Creatinine, Ser: 1.98 mg/dL — ABNORMAL HIGH (ref 0.44–1.00)
GFR, Estimated: 24 mL/min — ABNORMAL LOW (ref >60)
Glucose, Bld: 147 mg/dL — ABNORMAL HIGH (ref 70–99)
Potassium: 4.2 mmol/L (ref 3.5–5.1)
Sodium: 139 mmol/L (ref 135–145)
Total Bilirubin: 1.1 mg/dL (ref 0.3–1.2)
Total Protein: 8 g/dL (ref 6.5–8.1)

## 2021-12-28 LAB — RESP PANEL BY RT-PCR (FLU A&B, COVID) ARPGX2
Influenza A by PCR: NEGATIVE
Influenza B by PCR: NEGATIVE
SARS Coronavirus 2 by RT PCR: POSITIVE — AB

## 2021-12-28 LAB — TROPONIN I (HIGH SENSITIVITY): Troponin I (High Sensitivity): 32 ng/L — ABNORMAL HIGH (ref <18)

## 2021-12-28 LAB — LIPASE, BLOOD: Lipase: 33 U/L (ref 11–51)

## 2021-12-28 NOTE — ED Triage Notes (Signed)
Patient arrived with EMS from home reports nausea this morning , no emesis or diarrhea , denies fever or pain .

## 2021-12-28 NOTE — ED Provider Triage Note (Signed)
Emergency Medicine Provider Triage Evaluation Note  Ann Fletcher , a 88 y.o. female  was evaluated in triage.  Pt complains of nausea since this morning.  She has not had any episodes of emesis.  She has no abdominal pain, diarrhea, constipation.  No chest pain, shortness of breath.  She says she has associated cough that started this week.  Review of Systems  Positive:  Negative:   Physical Exam  BP (!) 171/80   Pulse 71   Temp 99.2 F (37.3 C)   Resp 19   SpO2 99%  Gen:   Awake, no distress   Resp:  Normal effort  MSK:   Moves extremities without difficulty  Other:  Abdomen soft, nontender  Medical Decision Making  Medically screening exam initiated at 9:49 PM.  Appropriate orders placed.  Ann Fletcher was informed that the remainder of the evaluation will be completed by another provider, this initial triage assessment does not replace that evaluation, and the importance of remaining in the ED until their evaluation is complete.     Ann Fletcher, [place] 12/28/21 2149

## 2021-12-29 ENCOUNTER — Telehealth: Payer: Self-pay | Admitting: Internal Medicine

## 2021-12-29 DIAGNOSIS — J441 Chronic obstructive pulmonary disease with (acute) exacerbation: Secondary | ICD-10-CM | POA: Diagnosis present

## 2021-12-29 LAB — HEMOGLOBIN A1C
Hgb A1c MFr Bld: 6.8 % — ABNORMAL HIGH (ref 4.8–5.6)
Mean Plasma Glucose: 148.46 mg/dL

## 2021-12-29 LAB — TROPONIN I (HIGH SENSITIVITY): Troponin I (High Sensitivity): 37 ng/L — ABNORMAL HIGH (ref <18)

## 2021-12-29 LAB — URINALYSIS, ROUTINE W REFLEX MICROSCOPIC
Bilirubin Urine: NEGATIVE
Glucose, UA: NEGATIVE mg/dL
Ketones, ur: NEGATIVE mg/dL
Nitrite: NEGATIVE
Protein, ur: NEGATIVE mg/dL
Specific Gravity, Urine: 1.008 (ref 1.005–1.030)
WBC, UA: >50 WBC/hpf — ABNORMAL HIGH (ref 0–5)
pH: 5 (ref 5.0–8.0)

## 2021-12-29 LAB — BASIC METABOLIC PANEL
Anion gap: 15 (ref 5–15)
BUN: 42 mg/dL — ABNORMAL HIGH (ref 8–23)
CO2: 24 mmol/L (ref 22–32)
Calcium: 9 mg/dL (ref 8.9–10.3)
Chloride: 101 mmol/L (ref 98–111)
Creatinine, Ser: 1.87 mg/dL — ABNORMAL HIGH (ref 0.44–1.00)
GFR, Estimated: 26 mL/min — ABNORMAL LOW (ref >60)
Glucose, Bld: 144 mg/dL — ABNORMAL HIGH (ref 70–99)
Potassium: 3.9 mmol/L (ref 3.5–5.1)
Sodium: 140 mmol/L (ref 135–145)

## 2021-12-29 LAB — CBG MONITORING, ED: Glucose-Capillary: 93 mg/dL (ref 70–99)

## 2021-12-29 LAB — GLUCOSE, CAPILLARY: Glucose-Capillary: 179 mg/dL — ABNORMAL HIGH (ref 70–99)

## 2021-12-29 MED ORDER — SODIUM CHLORIDE 0.9 % IV SOLN
1.0000 g | Freq: Once | INTRAVENOUS | Status: AC
  Administered 2021-12-29: 1 g via INTRAVENOUS
  Filled 2021-12-29: qty 10

## 2021-12-29 MED ORDER — ONDANSETRON HCL 4 MG/2ML IJ SOLN
4.0000 mg | Freq: Four times a day (QID) | INTRAMUSCULAR | Status: DC | PRN
  Administered 2022-01-01 – 2022-01-04 (×3): 4 mg via INTRAVENOUS
  Filled 2021-12-29 (×4): qty 2

## 2021-12-29 MED ORDER — ALBUTEROL SULFATE (2.5 MG/3ML) 0.083% IN NEBU
5.0000 mg | INHALATION_SOLUTION | RESPIRATORY_TRACT | Status: AC
  Administered 2021-12-29: 5 mg via RESPIRATORY_TRACT
  Filled 2021-12-29: qty 6

## 2021-12-29 MED ORDER — PREDNISONE 20 MG PO TABS
40.0000 mg | ORAL_TABLET | Freq: Every day | ORAL | Status: AC
  Administered 2021-12-30 – 2022-01-02 (×4): 40 mg via ORAL
  Filled 2021-12-29 (×4): qty 2

## 2021-12-29 MED ORDER — ASPIRIN 81 MG PO TBEC
81.0000 mg | DELAYED_RELEASE_TABLET | Freq: Every day | ORAL | Status: DC
  Administered 2021-12-30 – 2022-01-08 (×10): 81 mg via ORAL
  Filled 2021-12-29 (×10): qty 1

## 2021-12-29 MED ORDER — ONDANSETRON HCL 4 MG PO TABS: 4.0000 mg | ORAL_TABLET | Freq: Four times a day (QID) | ORAL | Status: DC | PRN

## 2021-12-29 MED ORDER — INSULIN ASPART 100 UNIT/ML IJ SOLN
0.0000 [IU] | Freq: Three times a day (TID) | INTRAMUSCULAR | Status: DC
  Administered 2021-12-30: 3 [IU] via SUBCUTANEOUS
  Administered 2021-12-30: 7 [IU] via SUBCUTANEOUS
  Administered 2021-12-30 (×2): 3 [IU] via SUBCUTANEOUS
  Administered 2021-12-31: 2 [IU] via SUBCUTANEOUS
  Administered 2021-12-31: 3 [IU] via SUBCUTANEOUS
  Administered 2021-12-31: 2 [IU] via SUBCUTANEOUS
  Administered 2022-01-01: 1 [IU] via SUBCUTANEOUS
  Administered 2022-01-01 (×2): 2 [IU] via SUBCUTANEOUS
  Administered 2022-01-02: 5 [IU] via SUBCUTANEOUS
  Administered 2022-01-02 – 2022-01-03 (×3): 3 [IU] via SUBCUTANEOUS
  Administered 2022-01-03: 1 [IU] via SUBCUTANEOUS
  Administered 2022-01-03 – 2022-01-04 (×3): 2 [IU] via SUBCUTANEOUS
  Administered 2022-01-04: 5 [IU] via SUBCUTANEOUS
  Administered 2022-01-05 – 2022-01-06 (×4): 3 [IU] via SUBCUTANEOUS
  Administered 2022-01-06: 5 [IU] via SUBCUTANEOUS
  Administered 2022-01-06 – 2022-01-07 (×2): 1 [IU] via SUBCUTANEOUS

## 2021-12-29 MED ORDER — LEVOTHYROXINE SODIUM 112 MCG PO TABS
112.0000 ug | ORAL_TABLET | Freq: Every day | ORAL | Status: DC
  Administered 2021-12-30 – 2022-01-08 (×10): 112 ug via ORAL
  Filled 2021-12-29 (×11): qty 1

## 2021-12-29 MED ORDER — IPRATROPIUM-ALBUTEROL 0.5-2.5 (3) MG/3ML IN SOLN
3.0000 mL | Freq: Once | RESPIRATORY_TRACT | Status: AC
  Administered 2021-12-29: 3 mL via RESPIRATORY_TRACT

## 2021-12-29 MED ORDER — VITAMIN E 45 MG (100 UNIT) PO CAPS
400.0000 [IU] | ORAL_CAPSULE | Freq: Every day | ORAL | Status: DC
  Administered 2021-12-30 – 2022-01-08 (×10): 400 [IU] via ORAL
  Filled 2021-12-29 (×10): qty 4

## 2021-12-29 MED ORDER — METHYLPREDNISOLONE SODIUM SUCC 125 MG IJ SOLR
125.0000 mg | Freq: Once | INTRAMUSCULAR | Status: AC
  Administered 2021-12-29: 125 mg via INTRAVENOUS
  Filled 2021-12-29: qty 2

## 2021-12-29 MED ORDER — RIVAROXABAN 10 MG PO TABS
10.0000 mg | ORAL_TABLET | Freq: Every day | ORAL | Status: DC
  Administered 2021-12-30 – 2022-01-08 (×10): 10 mg via ORAL
  Filled 2021-12-29 (×10): qty 1

## 2021-12-29 MED ORDER — ATORVASTATIN CALCIUM 40 MG PO TABS
40.0000 mg | ORAL_TABLET | Freq: Every day | ORAL | Status: DC
  Administered 2021-12-29 – 2022-01-07 (×10): 40 mg via ORAL
  Filled 2021-12-29 (×10): qty 1

## 2021-12-29 MED ORDER — IPRATROPIUM-ALBUTEROL 0.5-2.5 (3) MG/3ML IN SOLN: 3.0000 mL | RESPIRATORY_TRACT | Status: DC | PRN

## 2021-12-29 MED ORDER — MAGNESIUM SULFATE IN D5W 1-5 GM/100ML-% IV SOLN
1.0000 g | Freq: Once | INTRAVENOUS | Status: AC
Start: 2021-12-29 — End: 2021-12-29
  Administered 2021-12-29: 1 g via INTRAVENOUS
  Filled 2021-12-29: qty 100

## 2021-12-29 MED ORDER — ORAL CARE MOUTH RINSE: 15.0000 mL | OROMUCOSAL | Status: DC | PRN

## 2021-12-29 MED ORDER — ACETAMINOPHEN 325 MG PO TABS
650.0000 mg | ORAL_TABLET | Freq: Four times a day (QID) | ORAL | Status: DC | PRN
  Administered 2021-12-29 – 2022-01-05 (×3): 650 mg via ORAL
  Filled 2021-12-29 (×3): qty 2

## 2021-12-29 MED ORDER — BENZONATATE 100 MG PO CAPS
100.0000 mg | ORAL_CAPSULE | Freq: Three times a day (TID) | ORAL | Status: AC
  Administered 2021-12-29 – 2022-01-01 (×8): 100 mg via ORAL
  Filled 2021-12-29 (×9): qty 1

## 2021-12-29 NOTE — H&P (Signed)
Date: 12/29/2021               Patient Name:  Ann Fletcher MRN: [id number]  DOB: November 15, 1932 Age / Sex: 88 y.o., female   PCP: [facility]         Medical Service: Internal Medicine Teaching Service         Attending Physician: Dr. Heber [place], Rachel Moulds, DO    First Contact: Dr. Drucie Opitz, MD  Pager: [phone]  Second Contact: Dr. Rick Duff, MD  Pager: [phone]       After Hours (After 5p/  First Contact Pager: [phone]  weekends / holidays): Second Contact Pager: [phone]   Chief Complaint: hypoxemia with ambulation in the setting of COPD/ILD and COVID infection   History of Present Illness:   [name] is a 88 year old female with a history significant for COPD/ILD, atrial fibrillation (not on anticoagulation d/t anemia) status post ablation with permanent pacemaker 08/23/2017, IDA (unclear etiology), CKD 4, HTN, and T2DM.   Patient reports approximately 1 or 2 days prior to admission she began experiencing a nonproductive cough with associated rhinorrhea and sneezing.  She also notes some shortness of breath.  She did use her home albuterol inhaler approximately 3 times a day and noticed some mild improvement in her symptoms.  Patient does note that at baseline she uses her albuterol inhaler about this amount. She also noted some nausea.  Because of her persistent symptoms she presented to the ED.   She denies any subjective fevers, chills, chest pain, abdominal pain, dysuria, diarrhea or constipation.  Of note, patient's spouse tested positive for COVID as well but was asymptomatic.   Patient was going to be discharged home from the ED, however, she developed increased work of breathing with ambulation and audible wheezing. IMTS was consulted for further management.   Meds:  Current Meds  Medication Sig   Acetaminophen (TYLENOL) 325 MG CAPS Take 1 tablet by mouth every 8 (eight) hours as needed (for pain).   atorvastatin  (LIPITOR) 40 MG tablet Take 40 mg by mouth daily.   Calcium Carbonate-Vitamin D (OSCAL 500/200 D-3 PO) Take 1 tablet by mouth daily.   Cholecalciferol (VITAMIN D3) 50 MCG (2000 UT) TABS Take 2,000 Units by mouth daily.   furosemide (LASIX) 40 MG tablet Take 1 tablet (40 mg total) by mouth daily. (Patient taking differently: Take 60 mg by mouth daily. Take one and a half tablet (60 mg) by mouth daily.)   glimepiride (AMARYL) 2 MG tablet Take 2 mg by mouth every morning.   levothyroxine (SYNTHROID) 112 MCG tablet Take 112 mcg by mouth daily before breakfast.   LORazepam (ATIVAN) 1 MG tablet Take 1 mg by mouth at bedtime as needed for sleep.   pantoprazole (PROTONIX) 20 MG tablet Take 1 tablet (20 mg total) by mouth daily for 14 days. (Patient taking differently: Take 20 mg by mouth as needed for heartburn or indigestion.)   Polyvinyl Alcohol-Povidone (CLEAR EYES NATURAL TEARS OP) Place 1 drop into both eyes as needed (for dry eyes).     Allergies: Allergies as of 12/28/2021 - Review Complete 12/06/2021  Allergen Reaction Noted   Tizanidine hcl Other (See Comments) 06/02/2018   Nsaids Itching and Other (See Comments) 12/13/2012   Pradaxa [dabigatran etexilate mesylate] Nausea And Vomiting 03/24/2015   Sulfa antibiotics Nausea And Vomiting 10/29/2014   Other Other (See Comments) 09/11/2021   Betadine [povidone iodine] Itching and Rash 02/17/2014  Past Medical History:  Diagnosis Date   Arthritis    hands and back, back spinal stenosis   AV node arrhythmia    a. s/p AV node ablation/PPM 07/2017.   Chronic deafness in right ear    Chronic diastolic CHF (congestive heart failure) ([place])    a. Acute exacerbation occurred in setting of AF.   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 diagnosed in 10/2014 by PCP, underwent failed TEE DCCV on 10/08/14, loaded with amio, successfully DCCV on 10/15/14.   Presence of permanent cardiac pacemaker    Rotator cuff tear since april 2022   Uses walker    Wears dentures    full set    Family History:  Family History  Problem Relation Age of Onset   Pneumonia Mother    Heart attack Father    Diabetes Sister      Social History:   Social History   Socioeconomic History   Marital status: Married    Spouse name: Not on file   Number of children: Not on file   Years of education: Not on file   Highest education level: Not on file  Occupational History   Not on file  Tobacco Use   Smoking status: Never   Smokeless tobacco: Never  Vaping Use   Vaping Use: Never used  Substance and Sexual Activity   Alcohol use: No   Drug use: Never   Sexual activity: Yes    Birth control/protection: Surgical  Other Topics Concern   Not on file  Social History Narrative   Not on file   Social Determinants of Health   Financial Resource Strain: Not on file  Food Insecurity: Not on file  Transportation Needs: Not on file  Physical Activity: Not on file  Stress: Not on file  Social Connections: Not on file  Intimate Partner Violence: Not on file     Review of Systems: A complete ROS was negative except as per HPI.   Physical Exam: Blood pressure 125/75, pulse 69, temperature (!) 97.5 F (36.4 C), temperature source Oral, resp. rate 19, SpO2 100 %.  Constitutional: Well-developed, well-nourished.  HENT:  Head: Normocephalic and atraumatic.  Eyes: EOM are normal.  Neck: Normal range of motion.  Cardiovascular: Normal rate, regular rhythm, intact distal pulses. No gallop and no friction rub.  No murmur heard. 1-2+ edema of BLE  Pulmonary: Mildly increased WOB on 1L [place], diffuse expiratory wheezing   Abdominal: Soft. Normal bowel sounds. Non distended and non tender Musculoskeletal: Normal range of motion.        General: No tenderness or edema.  Neurological: Alert and oriented to person, place, and time. Non focal  Skin: Skin is warm and dry.    EKG: personally reviewed my interpretation is v paced   CXR: personally reviewed my interpretation is heart enlarged (though AP), no acute cardiopulmonary disease.   Assessment & Plan by Problem: Principal Problem:   COPD with acute exacerbation [facility])  Ms. Daron Breeding is a 88 year old female with a history significant for COPD/ILD, atrial fibrillation (not on anticoagulation d/t anemia)  status post ablation and permanent pacemaker 08/23/2017, IDA (unclear etiology), CKD 4, HTN, and T2DM who p/w URI symptoms found to be COVID + with wheezing and increased work of breathing concerning for COPD exacerbation.   #COPD exacerbation 2/2 COVID infection  Patient presented with 2 days of URI symptoms as well as shortness of breath and wheezing. She did test positive for COVID and notes that she has not been vaccinated before. She was noted to have increased work of breathing but was never became hypoxemic during this episode and a fever to 101.40F. She did have diffuse wheezing throughout her lung fields. She was given 1x dose solumedrol, magnesium, and ceftriaxone.   Her breathing was improved when evaluated at bedside but she continued to have diffuse wheezing.  -Will give increased dose of albuterol '5mg'$  x2 dose and prn duonebs -Start PO prednisone in the AM  -Tessalon perles for cough  -Will hold on COVID specific therapies as patient is not hypoxemic   #H/o atrial fibrillation s/p AV nodal ablation, ppm not on AC d/t chronic anemia  Patient is ventricularly paced on exam.  -Will continue to monitor  #Troponinemia  Elevated to 37 appears to be at her baseline in the setting of her CKD4.  #CKD4 Stable.   #IDA  Hgb stable at baseline.    #T2DM  A1c 6.8 ~1 year ago. She is unsure what medicines she takes for this. Will hold her home medications listed in the chart and start sliding scale.   #HTN Elevated blood pressure to 097D systolic. Will hold antihypertensives.    Dispo: Admit patient to Observation with expected length of stay less than 2 midnights.  Signed: Rick Duff, MD 12/29/2021, 6:32 PM  After 5pm on weekdays and 1pm on weekends: On Call pager: [phone]

## 2021-12-29 NOTE — ED Notes (Signed)
This EMT ambulated the patient in the room. While the patient was ambulating, her oxygen saturation dropped from 98 to 94. This EMT noticed wheezing during ambulation and after. This EMT will notify the RN of the event and will continue to monitor

## 2021-12-29 NOTE — ED Notes (Signed)
Admitting at the bedside.  

## 2021-12-29 NOTE — ED Notes (Signed)
Warm blankets placed on the pt

## 2021-12-29 NOTE — ED Notes (Signed)
Pure wick has been placed prior to my arrival

## 2021-12-29 NOTE — Telephone Encounter (Signed)
Returned call to patient's daughter Watt Climes left message on personal voice mail to call back.

## 2021-12-29 NOTE — Progress Notes (Signed)
Pt arrived to [address].

## 2021-12-29 NOTE — ED Provider Notes (Signed)
[facility] EMERGENCY DEPARTMENT Provider Note   CSN: [id number] Arrival date & time: 12/28/21  2057     History  Chief Complaint  Patient presents with   Nausea    Covid+    Ann Fletcher is a 88 y.o. female.  88 year old female with past medical history significant for CHF, COPD presents today for evaluation of 2-day duration of cough, sneezing, and shortness of breath.  She has been using albuterol at home with some improvement in symptoms.  Denies any chest pain.  Patient lives with her husband.  Husband is overall tested positive for COVID.  The history is provided by the patient. No language interpreter was used.       Home Medications Prior to Admission medications   Medication Sig Start Date End Date Taking? Authorizing Provider  Acetaminophen (TYLENOL ARTHRITIS PAIN PO) Take 2 tablets by mouth as needed (pain).    [provider]  albuterol (PROVENTIL HFA;VENTOLIN HFA) 108 (90 BASE) MCG/ACT inhaler Inhale 2 puffs into the lungs 3 (three) times daily.    [provider]  aspirin EC 81 MG tablet Take 81 mg by mouth daily. Swallow whole.    [provider]  atorvastatin (LIPITOR) 40 MG tablet Take 40 mg by mouth at bedtime.    [provider]  Calcium Carbonate-Vitamin D (OSCAL 500/200 D-3 PO) Take 1 tablet by mouth daily.    [provider]  Cholecalciferol (VITAMIN D3) 50 MCG (2000 UT) TABS Take 2,000 Units by mouth daily.    [provider]  clindamycin (CLEOCIN) 300 MG capsule Take 1 capsule (300 mg total) by mouth 3 (three) times daily. 08/31/20   Edrick Kins, DPM  Cyanocobalamin (VITAMIN B 12 PO) Take 1,000 mcg by mouth daily.    [provider]  fluticasone (FLONASE) 50 MCG/ACT nasal spray Place 1 spray into both nostrils daily as needed for allergies. 11/02/14   [provider]  furosemide (LASIX) 40 MG tablet Take 1 tablet (40 mg total) by mouth daily. 11/10/21   Almyra Deforest, PA   gentamicin cream (GARAMYCIN) 0.1 % Apply 1 application topically 2 (two) times daily. 08/22/20   Edrick Kins, DPM  glimepiride (AMARYL) 2 MG tablet Take 2 mg by mouth every morning. 03/30/19   [provider]  HYDROcodone-acetaminophen (NORCO/VICODIN) 5-325 MG tablet Take 1 tablet by mouth every 4 (four) hours as needed for moderate pain. 08/25/20   Edrick Kins, DPM  Lancet Devices (PRODIGY LANCING DEVICE) MISC See admin instructions. 11/23/19   [provider]  levothyroxine (SYNTHROID) 112 MCG tablet Take 112 mcg by mouth daily before breakfast.    [provider]  levothyroxine (SYNTHROID, LEVOTHROID) 100 MCG tablet Take 100 mcg by mouth daily before breakfast. 10/28/14   [provider]  lisinopril (ZESTRIL) 5 MG tablet Take 5 mg by mouth daily.    [provider]  LORazepam (ATIVAN) 1 MG tablet Take 1 mg by mouth at bedtime as needed for sleep.    [provider]  ondansetron (ZOFRAN) 4 MG tablet Take 4 mg by mouth every 6 (six) hours as needed for vomiting or nausea/vomiting. 02/15/20   [provider]  OVER THE COUNTER MEDICATION Take 1 tablet by mouth daily as needed (nausea). Gravol    [provider]  pantoprazole (PROTONIX) 20 MG tablet Take 1 tablet (20 mg total) by mouth daily for 14 days. 09/11/21 12/06/21  Jeanell Sparrow, DO  Prodigy Twist Top Lancets  28G MISC CHECK BLOOD GLUCOSE (SUGAR) TWICE DAILY 11/23/19   [provider]  vitamin E (VITAMIN E) 180 MG (400 UNITS) capsule Take 400 Units by mouth daily.     [provider]      Allergies    Tizanidine hcl, Nsaids, Pradaxa [dabigatran etexilate mesylate], Sulfa antibiotics, Other, and Betadine [povidone iodine]    Review of Systems   Review of Systems  Constitutional:  Negative for chills and fever.  Respiratory:  Positive for cough and shortness of breath.   Cardiovascular:  Negative for chest pain and leg swelling.  Gastrointestinal:   Negative for abdominal pain.  Genitourinary:  Negative for dysuria and flank pain.  All other systems reviewed and are negative.   Physical Exam Updated Vital Signs BP (!) 142/77   Pulse 70   Temp (!) 97.5 F (36.4 C) (Oral)   Resp 19   SpO2 100%  Physical Exam Vitals and nursing note reviewed.  Constitutional:      General: She is not in acute distress.    Appearance: Normal appearance. She is not ill-appearing.  HENT:     Head: Normocephalic and atraumatic.     Nose: Nose normal.  Eyes:     General: No scleral icterus.    Extraocular Movements: Extraocular movements intact.     Conjunctiva/sclera: Conjunctivae normal.  Cardiovascular:     Rate and Rhythm: Normal rate and regular rhythm.     Pulses: Normal pulses.  Pulmonary:     Effort: Respiratory distress present.     Breath sounds: Wheezing present. No rales.  Abdominal:     General: There is no distension.     Tenderness: There is no abdominal tenderness.  Musculoskeletal:        General: Normal range of motion.     Cervical back: Normal range of motion.  Skin:    General: Skin is warm and dry.  Neurological:     General: No focal deficit present.     Mental Status: She is alert. Mental status is at baseline.     ED Results / Procedures / Treatments   Labs (all labs ordered are listed, but only abnormal results are displayed) Labs Reviewed  RESP PANEL BY RT-PCR (FLU A&B, COVID) ARPGX2 - Abnormal; Notable for the following components:      Result Value   SARS Coronavirus 2 by RT PCR POSITIVE (*)    All other components within normal limits  CBC WITH DIFFERENTIAL/PLATELET - Abnormal; Notable for the following components:   RBC 3.85 (*)    Hemoglobin 9.3 (*)    HCT 30.0 (*)    MCV 77.9 (*)    MCH 24.2 (*)    RDW 17.9 (*)    Platelets 143 (*)    All other components within normal limits  COMPREHENSIVE METABOLIC PANEL - Abnormal; Notable for the following components:   Glucose, Bld 147 (*)    BUN 39  (*)    Creatinine, Ser 1.98 (*)    GFR, Estimated 24 (*)    All other components within normal limits  URINALYSIS, ROUTINE W REFLEX MICROSCOPIC - Abnormal; Notable for the following components:   APPearance HAZY (*)    Hgb urine dipstick SMALL (*)    Leukocytes,Ua LARGE (*)    WBC, UA >50 (*)    Bacteria, UA MANY (*)    All other components within normal limits  TROPONIN I (HIGH SENSITIVITY) - Abnormal; Notable for the following components:   Troponin I (High  Sensitivity) 32 (*)    All other components within normal limits  TROPONIN I (HIGH SENSITIVITY) - Abnormal; Notable for the following components:   Troponin I (High Sensitivity) 37 (*)    All other components within normal limits  LIPASE, BLOOD  CBG MONITORING, ED    EKG EKG Interpretation  Date/Time:  Thursday December 28 2021 21:47:33 EDT Ventricular Rate:  81 PR Interval:    QRS Duration: 112 QT Interval:  420 QTC Calculation: 487 R Axis:   -73 Text Interpretation: Ventricular-paced rhythm Abnormal ECG When compared with ECG of 10-Sep-2021 22:02, No significant change was found Confirmed by Delora Fuel ([id number]) on 12/29/2021 3:54:13 AM  Radiology DG Chest 2 View  Result Date: 12/28/2021 CLINICAL DATA:  Nausea EXAM: CHEST - 2 VIEW COMPARISON:  Radiograph 09/11/2021 FINDINGS: Stable cardiomegaly. Aortic atherosclerotic calcification. Left chest wall pacemaker no focal consolidation, pleural effusion, or pneumothorax. No acute osseous abnormality. Chronic posttraumatic deformity right shoulder. IMPRESSION: No active cardiopulmonary disease.  Cardiomegaly. Electronically Signed   By: Placido Sou M.D.   On: 12/28/2021 22:40    Procedures Procedures    Medications Ordered in ED Medications  acetaminophen (TYLENOL) tablet 650 mg (650 mg Oral Given 12/29/21 0913)  cefTRIAXone (ROCEPHIN) 1 g in sodium chloride 0.9 % 100 mL IVPB (1 g Intravenous New Bag/Given 12/29/21 1447)  methylPREDNISolone sodium succinate  (SOLU-MEDROL) 125 mg/2 mL injection 125 mg (has no administration in time range)  magnesium sulfate IVPB 1 g 100 mL (has no administration in time range)  ipratropium-albuterol (DUONEB) 0.5-2.5 (3) MG/3ML nebulizer solution 3 mL (3 mLs Nebulization Given 12/29/21 1452)    ED Course/ Medical Decision Making/ A&P Clinical Course as of 12/29/21 1516  Fri Dec 29, 2021  1515 Patient was ambulated within the room saturations remained greater than 90% throughout.  Following ambulation trial patient has significant increase in work of breathing as well as wheezing.  Patient following 2 DuoNeb treatments without significant improvement in wheezing.  Coarse lung sounds throughout. [AA]    Clinical Course User Index [AA] Evlyn Courier, PA-C                           Medical Decision Making Risk Prescription drug management.   Medical Decision Making / ED Course   This patient presents to the ED for concern of shortness of breath, this involves an extensive number of treatment options, and is a complaint that carries with it a high risk of complications and morbidity.  The differential diagnosis includes viral URI, COPD exacerbation, CHF exacerbation  MDM: 88 year old female presents today for evaluation of above-mentioned complaints.  Patient is COVID-positive.  CHF without leukocytosis.  Hemoglobin of 9.3 which is around her baseline.  CMP shows creatinine of 1.98 which is around her baseline otherwise electrolytes within normal limits.  UA with evidence of UTI.  Troponin elevated to 32 then 37.  This peers to be around her baseline given no chest pain, flat troponin, and EKG without acute ischemic changes doubt ACS.  Low suspicion for CHF exacerbation as patient has no signs of volume overload.  Chest x-ray without pulmonary edema.  As noted above patient ambulated and has significant increased in work of breathing.  Following 2 DuoNeb treatments she remained without improvement.  Solu-Medrol ordered,  magnesium ordered.  Will discuss with hospitalist for admission for COPD exacerbation in setting of COVID infection.  Case discussed with internal medicine team.  They will evaluate patient for  admission.  Lab Tests: -I ordered, reviewed, and interpreted labs.   The pertinent results include:   Labs Reviewed  RESP PANEL BY RT-PCR (FLU A&B, COVID) ARPGX2 - Abnormal; Notable for the following components:      Result Value   SARS Coronavirus 2 by RT PCR POSITIVE (*)    All other components within normal limits  CBC WITH DIFFERENTIAL/PLATELET - Abnormal; Notable for the following components:   RBC 3.85 (*)    Hemoglobin 9.3 (*)    HCT 30.0 (*)    MCV 77.9 (*)    MCH 24.2 (*)    RDW 17.9 (*)    Platelets 143 (*)    All other components within normal limits  COMPREHENSIVE METABOLIC PANEL - Abnormal; Notable for the following components:   Glucose, Bld 147 (*)    BUN 39 (*)    Creatinine, Ser 1.98 (*)    GFR, Estimated 24 (*)    All other components within normal limits  URINALYSIS, ROUTINE W REFLEX MICROSCOPIC - Abnormal; Notable for the following components:   APPearance HAZY (*)    Hgb urine dipstick SMALL (*)    Leukocytes,Ua LARGE (*)    WBC, UA >50 (*)    Bacteria, UA MANY (*)    All other components within normal limits  TROPONIN I (HIGH SENSITIVITY) - Abnormal; Notable for the following components:   Troponin I (High Sensitivity) 32 (*)    All other components within normal limits  TROPONIN I (HIGH SENSITIVITY) - Abnormal; Notable for the following components:   Troponin I (High Sensitivity) 37 (*)    All other components within normal limits  LIPASE, BLOOD  CBG MONITORING, ED      EKG  EKG Interpretation  Date/Time:  Thursday December 28 2021 21:47:33 EDT Ventricular Rate:  81 PR Interval:    QRS Duration: 112 QT Interval:  420 QTC Calculation: 487 R Axis:   -73 Text Interpretation: Ventricular-paced rhythm Abnormal ECG When compared with ECG of 10-Sep-2021  22:02, No significant change was found Confirmed by Delora Fuel ([id number]) on 12/29/2021 3:54:13 AM         Imaging Studies ordered: I ordered imaging studies including CXR I independently visualized and interpreted imaging. I agree with the radiologist interpretation   Medicines ordered and prescription drug management: Meds ordered this encounter  Medications   acetaminophen (TYLENOL) tablet 650 mg   cefTRIAXone (ROCEPHIN) 1 g in sodium chloride 0.9 % 100 mL IVPB    Order Specific Question:   Antibiotic Indication:    Answer:   UTI   ipratropium-albuterol (DUONEB) 0.5-2.5 (3) MG/3ML nebulizer solution 3 mL   methylPREDNISolone sodium succinate (SOLU-MEDROL) 125 mg/2 mL injection 125 mg    IV methylprednisolone will be converted to either a q12h or q24h frequency with the same total daily dose (TDD).  Ordered Dose: 1 to 125 mg TDD; convert to: TDD q24h.  Ordered Dose: 126 to 250 mg TDD; convert to: TDD div q12h.  Ordered Dose: >250 mg TDD; DAW.   magnesium sulfate IVPB 1 g 100 mL    -I have reviewed the patients home medicines and have made adjustments as needed  Critical interventions DuoNebs, magnesium, Solu-Medrol  Reevaluation: After the interventions noted above, I reevaluated the patient and found that they have :stayed the same  Co morbidities that complicate the patient evaluation  Past Medical History:  Diagnosis Date   Arthritis    hands and back, back spinal stenosis   AV node arrhythmia  a. s/p AV node ablation/PPM 07/2017.   Chronic deafness in right ear    Chronic diastolic CHF (congestive heart failure) ([place])    a. Acute exacerbation occurred in setting of AF.   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 diagnosed in 10/2014 by PCP, underwent failed TEE DCCV on 10/08/14, loaded with amio, successfully DCCV on 10/15/14.   Presence of permanent cardiac pacemaker    Rotator cuff tear since april 2022   Uses walker    Wears dentures    full set      Dispostion: Patient discussed with internal medicine team who will evaluate patient for admission.   Final Clinical Impression(s) / ED Diagnoses Final diagnoses:  COVID-19  COPD exacerbation [facility])    Rx / DC Orders ED Discharge Orders     None         Evlyn Courier, PA-C 12/29/21 [place], DO 12/29/21 1617

## 2021-12-29 NOTE — Hospital Course (Addendum)
Ms. Xiadani Damman is a 88 year old female with a history significant for COPD/ILD, atrial fibrillation (not on anticoagulation d/t anemia) status post ablation with permanent pacemaker 08/23/2017, IDA (unclear etiology), CKD 4, HTN, and T2DM.   Patient reports approximately 1 or 2 days prior to admission she began experiencing a nonproductive cough with associated rhinorrhea and sneezing.  She also notes some shortness of breath.  She did use her home albuterol inhaler approximately 3 times a day and noticed some mild improvement in her symptoms.  Patient does note that at baseline she uses her albuterol inhaler about this amount. She also noted some nausea.  Because of her persistent symptoms she presented to the ED.   She denies any subjective fevers, chills, chest pain, abdominal pain, dysuria, diarrhea or constipation.  Of note, patient's spouse tested positive for COVID as well but was asymptomatic.   Patient was going to be discharged home from the ED, however, she developed hypoxia with ambulating.     01/04/2022  Vomited, after eating spaghetti (spaghetti in her vomitus), 2x.   No dizziness.   Breathing is okay but audible wheezing.

## 2021-12-29 NOTE — ED Notes (Signed)
Pt noted to be in moderate respiratory distress. Pt boosted in bed and repositioned, O2 placed on pt and neb initiated for audible expiratory wheezing w/ prolonged expiratory phase. MD Tyrone Nine notified

## 2021-12-29 NOTE — Telephone Encounter (Signed)
Patient's daughter is requesting call back to discuss patient being in hospital now, but still waiting for a room. Requesting call back.

## 2021-12-30 DIAGNOSIS — J441 Chronic obstructive pulmonary disease with (acute) exacerbation: Secondary | ICD-10-CM

## 2021-12-30 LAB — BASIC METABOLIC PANEL
Anion gap: 11 (ref 5–15)
BUN: 46 mg/dL — ABNORMAL HIGH (ref 8–23)
CO2: 24 mmol/L (ref 22–32)
Calcium: 8.6 mg/dL — ABNORMAL LOW (ref 8.9–10.3)
Chloride: 103 mmol/L (ref 98–111)
Creatinine, Ser: 1.97 mg/dL — ABNORMAL HIGH (ref 0.44–1.00)
GFR, Estimated: 24 mL/min — ABNORMAL LOW (ref >60)
Glucose, Bld: 298 mg/dL — ABNORMAL HIGH (ref 70–99)
Potassium: 4.2 mmol/L (ref 3.5–5.1)
Sodium: 138 mmol/L (ref 135–145)

## 2021-12-30 LAB — GLUCOSE, CAPILLARY
Glucose-Capillary: 230 mg/dL — ABNORMAL HIGH (ref 70–99)
Glucose-Capillary: 233 mg/dL — ABNORMAL HIGH (ref 70–99)
Glucose-Capillary: 243 mg/dL — ABNORMAL HIGH (ref 70–99)
Glucose-Capillary: 253 mg/dL — ABNORMAL HIGH (ref 70–99)
Glucose-Capillary: 307 mg/dL — ABNORMAL HIGH (ref 70–99)

## 2021-12-30 LAB — FERRITIN: Ferritin: 235 ng/mL (ref 11–307)

## 2021-12-30 LAB — CBC
HCT: 28.3 % — ABNORMAL LOW (ref 36.0–46.0)
Hemoglobin: 8.9 g/dL — ABNORMAL LOW (ref 12.0–15.0)
MCH: 24.3 pg — ABNORMAL LOW (ref 26.0–34.0)
MCHC: 31.4 g/dL (ref 30.0–36.0)
MCV: 77.3 fL — ABNORMAL LOW (ref 80.0–100.0)
Platelets: 115 10*3/uL — ABNORMAL LOW (ref 150–400)
RBC: 3.66 MIL/uL — ABNORMAL LOW (ref 3.87–5.11)
RDW: 17.8 % — ABNORMAL HIGH (ref 11.5–15.5)
WBC: 5 10*3/uL (ref 4.0–10.5)
nRBC: 0 % (ref 0.0–0.2)

## 2021-12-30 LAB — BRAIN NATRIURETIC PEPTIDE: B Natriuretic Peptide: 645.5 pg/mL — ABNORMAL HIGH (ref 0.0–100.0)

## 2021-12-30 LAB — IRON AND TIBC
Iron: 12 ug/dL — ABNORMAL LOW (ref 28–170)
Saturation Ratios: 5 % — ABNORMAL LOW (ref 10.4–31.8)
TIBC: 232 ug/dL — ABNORMAL LOW (ref 250–450)
UIBC: 220 ug/dL

## 2021-12-30 LAB — TROPONIN I (HIGH SENSITIVITY): Troponin I (High Sensitivity): 38 ng/L — ABNORMAL HIGH (ref <18)

## 2021-12-30 MED ORDER — IPRATROPIUM-ALBUTEROL 0.5-2.5 (3) MG/3ML IN SOLN: 3.0000 mL | RESPIRATORY_TRACT | Status: DC

## 2021-12-30 MED ORDER — IPRATROPIUM-ALBUTEROL 0.5-2.5 (3) MG/3ML IN SOLN
3.0000 mL | RESPIRATORY_TRACT | Status: DC | PRN
  Administered 2021-12-30 – 2022-01-08 (×5): 3 mL via RESPIRATORY_TRACT
  Filled 2021-12-30 (×5): qty 3

## 2021-12-30 MED ORDER — MENTHOL 3 MG MT LOZG: 1.0000 | LOZENGE | OROMUCOSAL | Status: DC | PRN

## 2021-12-30 MED ORDER — FUROSEMIDE 40 MG PO TABS
40.0000 mg | ORAL_TABLET | Freq: Every day | ORAL | Status: DC
  Administered 2021-12-30 – 2022-01-05 (×7): 40 mg via ORAL
  Filled 2021-12-30 (×7): qty 1

## 2021-12-30 MED ORDER — IPRATROPIUM-ALBUTEROL 0.5-2.5 (3) MG/3ML IN SOLN
3.0000 mL | RESPIRATORY_TRACT | Status: AC
  Administered 2021-12-30 (×2): 3 mL via RESPIRATORY_TRACT
  Filled 2021-12-30 (×2): qty 3

## 2021-12-30 NOTE — Progress Notes (Signed)
   Subjective:  Patient reports that she feels like her breathing has improved somewhat with the nebulizer treatments.  She is ready to go home.  She states that she does wheeze often at home but her current wheezing is worse than usual.  Objective:  Vital signs in last 24 hours: Vitals:   12/29/21 2348 12/30/21 0348 12/30/21 0849 12/30/21 1156  BP: (!) 141/67 (!) 123/56 (!) 143/83   Pulse: 78 70 71   Resp: '20 20 19   '$ Temp: 99.2 F (37.3 C) 98.9 F (37.2 C) 98.4 F (36.9 C)   TempSrc: Oral Oral Oral   SpO2: 96% 94% 94% 95%  Weight:      Height:       Physical Exam  Constitutional: Well-developed, well-nourished, and in no distress.  HENT:  Head: Normocephalic and atraumatic.  Eyes: EOM are normal.  Neck: Normal range of motion.  Cardiovascular: Normal rate, regular rhythm, intact distal pulses. No gallop and no friction rub.  No murmur heard. Trace-1+ lower extremity edema  Pulmonary: Non labored breathing on room air, no rales. Expiratory wheezing throughout her lung fields.  Abdominal: Soft. Normal bowel sounds. Non distended and non tender Musculoskeletal: Normal range of motion.        General: No tenderness Neurological: Alert and oriented to person, place, and time. Non focal  Skin: Skin is warm and dry.    Assessment/Plan:  Principal Problem:   COPD with acute exacerbation [facility])  Ann Fletcher is a 88 year old female with a history significant for COPD/ILD, atrial fibrillation (not on anticoagulation d/t anemia) status post ablation and permanent pacemaker 08/23/2017, IDA (unclear etiology), CKD 4, HTN, and T2DM who p/w URI symptoms found to be COVID + with noted wheezing and increased work of breathing on exam concerning for COPD exacerbation.   #COPD/ILD exacerbation 2/2 COVID infection Patient notes her symptoms have improved.  She continues to have significant wheezing and some increased work of breathing on exam.  Thankfully she is not hypoxemic. -Schedule  DuoNebs overnight -Continue oral prednisone -Patient may benefit from longer acting inhaler on discharge as well as pulmonology follow-up  #H/o atrial fibrillation s/p AV nodal ablation, ppm not on AC d/t chronic anemia  Patient is ventricularly paced. -Will continue to monitor  #HFpEF #HTN On lasix only for her HFpEF. Patient's therapy is limited by her CKD. She appears euvolemic to mildly hypervolemic on exam (trace to 1+ edema of BLE).  -Will resume home lasix dose  #Troponinemia  Elevated to 38 this AM, trended flat. Appears to be at her baseline and likely elevated in the setting of her CKD4.  #CKD4 Stable.  Avoid nephrotoxic medications.   #IDA  Hgb stable at baseline.   #T2DM  A1c 6.8 ~1 year ago. She is unsure what medicines she takes for this. Will hold her home medications listed in the chart and start sliding scale.   Prior to Admission Living Arrangement: Anticipated Discharge Location: Barriers to Discharge: Dispo: Anticipated discharge in approximately 2 day(s).   Rick Duff, MD 12/30/2021, 2:08 PM After 5pm on weekdays and 1pm on weekends: On Call pager [phone]

## 2021-12-30 NOTE — Care Management Obs Status (Signed)
[place] NOTIFICATION   Patient Details  Name: Ann Fletcher MRN: [id number] Date of Birth: Feb 26, 1933   Medicare Observation Status Notification Given:  Yes    Bartholomew Crews, RN 12/30/2021, 4:38 PM

## 2021-12-30 NOTE — TOC Progression Note (Signed)
Transition of Care [facility]) - Progression Note    Patient Details  Name: [name] MRN: [id number] Date of Birth: Jun 25, 1932  Transition of Care [facility] [place]) CM/SW Contact  Bartholomew Crews, RN Phone Number: [phone] 12/30/2021, 4:40 PM  Clinical Narrative:     Spoke with patient on the hospital room phone. Patient stated that her daughter will provide transportation home at discharge. Patient stated that she expects to discharge tomorrow and that her daughter would pick her up after to church. Patient reported having PACE services the last few months.        Expected Discharge Plan and Services                                                 Social Determinants of Health (SDOH) Interventions    Readmission Risk Interventions    02/09/2020    2:11 PM  Readmission Risk Prevention Plan  Transportation Screening Complete  PCP or Specialist Appt within 5-7 Days Complete  Medication Review (RN CM) Complete

## 2021-12-31 LAB — BASIC METABOLIC PANEL
Anion gap: 14 (ref 5–15)
BUN: 52 mg/dL — ABNORMAL HIGH (ref 8–23)
CO2: 23 mmol/L (ref 22–32)
Calcium: 8.7 mg/dL — ABNORMAL LOW (ref 8.9–10.3)
Chloride: 99 mmol/L (ref 98–111)
Creatinine, Ser: 1.91 mg/dL — ABNORMAL HIGH (ref 0.44–1.00)
GFR, Estimated: 25 mL/min — ABNORMAL LOW (ref >60)
Glucose, Bld: 165 mg/dL — ABNORMAL HIGH (ref 70–99)
Potassium: 3.9 mmol/L (ref 3.5–5.1)
Sodium: 136 mmol/L (ref 135–145)

## 2021-12-31 LAB — MAGNESIUM: Magnesium: 2.3 mg/dL (ref 1.7–2.4)

## 2021-12-31 LAB — CBC
HCT: 31.1 % — ABNORMAL LOW (ref 36.0–46.0)
Hemoglobin: 9.7 g/dL — ABNORMAL LOW (ref 12.0–15.0)
MCH: 24.1 pg — ABNORMAL LOW (ref 26.0–34.0)
MCHC: 31.2 g/dL (ref 30.0–36.0)
MCV: 77.4 fL — ABNORMAL LOW (ref 80.0–100.0)
Platelets: 133 10*3/uL — ABNORMAL LOW (ref 150–400)
RBC: 4.02 MIL/uL (ref 3.87–5.11)
RDW: 17.8 % — ABNORMAL HIGH (ref 11.5–15.5)
WBC: 10.8 10*3/uL — ABNORMAL HIGH (ref 4.0–10.5)
nRBC: 0 % (ref 0.0–0.2)

## 2021-12-31 LAB — GLUCOSE, CAPILLARY
Glucose-Capillary: 151 mg/dL — ABNORMAL HIGH (ref 70–99)
Glucose-Capillary: 162 mg/dL — ABNORMAL HIGH (ref 70–99)
Glucose-Capillary: 220 mg/dL — ABNORMAL HIGH (ref 70–99)
Glucose-Capillary: 319 mg/dL — ABNORMAL HIGH (ref 70–99)

## 2021-12-31 NOTE — Evaluation (Signed)
Physical Therapy Evaluation Patient Details Name: Ann Fletcher MRN: [id number] DOB: 04-Jul-1932 Today's Date: 12/31/2021  History of Present Illness  88 year old person  who presented 12/29/21 with several days of cough and upper respiratory symptoms, found to be COVID-positive, and admitted for increased work of breathing with ambulation and wheezing.   PMH significant of ILD w/ possible COPD (never smoker), A-fib status post ablation with permanent pacemaker, iron deficiency anemia, CKD 4, hypertension  Clinical Impression   Pt admitted secondary to problem above with deficits below. PTA patient was modified independent with mobility with use of rollator. She has an aide that assists her with her ADLs. She provides some care for her husband (fills his pill box).  Pt currently requires min assist for bed mobility and minguard assist for transfers and ambulation x 55 ft with RW. She required cues for safe use of RW. Patient reports that her husband is currently in the ED and her aide will not be coming because she also has COVID. Currently recommending SNF unless daughter can provide daily intermittent care for patient (which patient says she cannot).  Anticipate patient will benefit from PT to address problems listed below.Will continue to follow acutely to maximize functional mobility independence and safety.    Of note, her oxygen saturation was 96-100% while ambulating on room air.         Recommendations for follow up therapy are one component of a multi-disciplinary discharge planning process, led by the attending physician.  Recommendations may be updated based on patient status, additional functional criteria and insurance authorization.  Follow Up Recommendations Skilled nursing-short term rehab (<3 hours/day) Can patient physically be transported by private vehicle: Yes    Assistance Recommended at Discharge Intermittent Supervision/Assistance  Patient can return home with the  following  A little help with walking and/or transfers;A lot of help with bathing/dressing/bathroom;Assistance with cooking/housework;Help with stairs or ramp for entrance    Equipment Recommendations None recommended by PT  Recommendations for Other Services  OT consult    Functional Status Assessment Patient has had a recent decline in their functional status and demonstrates the ability to make significant improvements in function in a reasonable and predictable amount of time.     Precautions / Restrictions Precautions Precautions: Fall Precaution Comments: denies falls      Mobility  Bed Mobility Overal bed mobility: Needs Assistance Bed Mobility: Supine to Sit     Supine to sit: Min assist, HOB elevated     General bed mobility comments: assist to raise torso to come to sit; able to scoot out to edge with minguard    Transfers Overall transfer level: Needs assistance Equipment used: Rolling walker (2 wheels) Transfers: Sit to/from Stand Sit to Stand: Min guard           General transfer comment: vc for proper, safe hand placement    Ambulation/Gait Ambulation/Gait assistance: Min guard Gait Distance (Feet): 55 Feet Assistive device: Rolling walker (2 wheels) Gait Pattern/deviations: Step-through pattern, Decreased stride length, Trunk flexed   Gait velocity interpretation: 1.31 - 2.62 ft/sec, indicative of limited community ambulator   General Gait Details: pt steady with RW; cues for which way to turn to avoid her lines, leads, etc  Stairs            Wheelchair Mobility    Modified Rankin (Stroke Patients Only)       Balance Overall balance assessment: Needs assistance Sitting-balance support: No upper extremity supported, Feet supported Sitting balance-Leahy Scale: Good  Sitting balance - Comments: cannot reach to feet due to stiffness   Standing balance support: Bilateral upper extremity supported, During functional activity, Reliant on  assistive device for balance Standing balance-Leahy Scale: Poor                               Pertinent Vitals/Pain Pain Assessment Pain Assessment: No/denies pain    Home Living Family/patient expects to be discharged to:: Private residence Living Arrangements: Spouse/significant other Available Help at Discharge: Personal care attendant Type of Home: Apartment Home Access: Ramped entrance       Home Layout: One level Home Equipment: Rollator (4 wheels);Tub bench      Prior Function Prior Level of Function : Needs assist       Physical Assist : ADLs (physical)   ADLs (physical): Bathing;Dressing;IADLs Mobility Comments: patient denies need for assist; uses rollator that does not fit in her bathroom (she holds wall, etc to get into bathroom); only does shower transfers when aide present ADLs Comments: aide assists with bathing and dressing     Hand Dominance        Extremity/Trunk Assessment   Upper Extremity Assessment Upper Extremity Assessment: Generalized weakness;Defer to OT evaluation (arthritic changes in shoulders)    Lower Extremity Assessment Lower Extremity Assessment: Generalized weakness    Cervical / Trunk Assessment Cervical / Trunk Assessment: Kyphotic  Communication   Communication: HOH  Cognition Arousal/Alertness: Awake/alert Behavior During Therapy: WFL for tasks assessed/performed Overall Cognitive Status: Within Functional Limits for tasks assessed                                 General Comments: although does want to rely on the Lord to help her get dressed while her aide is out with COVID        General Comments General comments (skin integrity, edema, etc.): on room air with sats 96-100% while mobilizing; +edema noted with hospital socks leaving a slight indentation in her lower legs    Exercises     Assessment/Plan    PT Assessment Patient needs continued PT services  PT Problem List Decreased  strength;Decreased balance;Decreased mobility;Decreased knowledge of use of DME;Decreased safety awareness       PT Treatment Interventions DME instruction;Gait training;Functional mobility training;Therapeutic activities;Therapeutic exercise;Balance training;Patient/family education    PT Goals (Current goals can be found in the Care Plan section)  Acute Rehab PT Goals Patient Stated Goal: go home PT Goal Formulation: With patient Time For Goal Achievement: 01/14/22 Potential to Achieve Goals: Good    Frequency Min 3X/week     Co-evaluation               AM-PAC PT "6 Clicks" Mobility  Outcome Measure Help needed turning from your back to your side while in a flat bed without using bedrails?: A Little Help needed moving from lying on your back to sitting on the side of a flat bed without using bedrails?: A Lot Help needed moving to and from a bed to a chair (including a wheelchair)?: A Little Help needed standing up from a chair using your arms (e.g., wheelchair or bedside chair)?: A Little Help needed to walk in hospital room?: A Little Help needed climbing 3-5 steps with a railing? : Total 6 Click Score: 15    End of Session Equipment Utilized During Treatment: Gait belt Activity Tolerance: Patient tolerated treatment  well Patient left: in chair;with call bell/phone within reach;with chair alarm set Nurse Communication: Mobility status;Other (comment) (discharge complications) PT Visit Diagnosis: Muscle weakness (generalized) (M62.81);Difficulty in walking, not elsewhere classified (R26.2)    Time: 5638-7564 PT Time Calculation (min) (ACUTE ONLY): 33 min   Charges:   PT Evaluation $PT Eval Moderate Complexity: 1 Mod PT Treatments $Gait Training: 8-22 mins         Arby Barrette, PT Acute Rehabilitation Services  Office [phone]   Rexanne Mano 12/31/2021, 12:18 PM

## 2021-12-31 NOTE — Plan of Care (Signed)
  Problem: Education: Goal: Knowledge of General Education information will improve Description: Including pain rating scale, medication(s)/side effects and non-pharmacologic comfort measures Outcome: Progressing   Problem: Coping: Goal: Level of anxiety will decrease Outcome: Progressing   Problem: Safety: Goal: Ability to remain free from injury will improve Outcome: Progressing   

## 2021-12-31 NOTE — Progress Notes (Signed)
   Subjective:  Patient continues to feel that her breathing is improved.  She notes that her husband is now in the emergency department after a fall.  In addition her caregiver who helps with some of her ADLs is sick with COVID.  Patient was able to ambulate without any desaturations.  She denies any chest pain, wheezing, dyspnea, she has been able to eat and drink without any difficulty.  Objective:  Vital signs in last 24 hours: Vitals:   12/30/21 1500 12/30/21 2038 12/31/21 0508 12/31/21 0808  BP: 124/82 (!) 169/73 (!) 172/83 (!) 170/83  Pulse: 74 72 71 72  Resp: '16 17 17 17  '$ Temp: 98.9 F (37.2 C) 99 F (37.2 C) 98.8 F (37.1 C) 97.6 F (36.4 C)  TempSrc: Oral Oral Oral Oral  SpO2: 99% 100% 98% 99%  Weight:      Height:        Constitutional: Well-developed, well-nourished, and in no distress.  HENT:  Head: Normocephalic and atraumatic.  Eyes: EOM are normal.  Neck: Normal range of motion.  Cardiovascular: Normal rate, regular rhythm, intact distal pulses. No gallop and no friction rub.  No murmur heard. Trace lower extremity edema  Pulmonary: Non labored breathing on room air, no rales. No wheezing Abdominal: Soft. Normal bowel sounds. Non distended and non tender Musculoskeletal: Normal range of motion.        General: No tenderness Neurological: Alert and oriented to person, place, and time. Non focal  Skin: Skin is warm and dry.    Assessment/Plan:  Principal Problem:   COPD with acute exacerbation [facility])  Ann Fletcher is a 88 year old female with a history significant for COPD/ILD, atrial fibrillation (not on anticoagulation d/t anemia) status post ablation and permanent pacemaker 08/23/2017, IDA (unclear etiology), CKD 4, HTN, and T2DM who p/w URI symptoms found to be COVID + with noted wheezing and increased work of breathing on exam concerning for COPD exacerbation.   #COPD/ILD exacerbation 2/2 COVID infection Patient notes her symptoms continue to  improve.  Her vital signs are stable and she has no new hypoxemia. Her wheezing has also resolved.  -Continue oral prednisone -Patient may benefit from longer acting inhaler on discharge as well as pulmonology follow-up  #H/o atrial fibrillation s/p AV nodal ablation, ppm not on AC d/t chronic anemia  Patient is ventricularly paced. -Will continue to monitor  #HFpEF #HTN On lasix only for her HFpEF. Patient's therapy is limited by her CKD. She appears euvolemic to mildly hypervolemic on exam (trace to 1+ edema of BLE).  -Continue home lasix dose  #CKD4 Stable.  Avoid nephrotoxic medications.   #IDA  Hgb stable at baseline.   #T2DM  A1c 6.8 ~1 year ago. She is unsure what medicines she takes for this. Will continue to hold her home medications listed in the chart and continue sliding scale.   Prior to Admission Living Arrangement: Anticipated Discharge Location: Barriers to Discharge: Dispo: Anticipated discharge in approximately 2 day(s).   Rick Duff, MD 12/31/2021, 1:59 PM After 5pm on weekdays and 1pm on weekends: On Call pager [phone]

## 2022-01-01 DIAGNOSIS — J441 Chronic obstructive pulmonary disease with (acute) exacerbation: Secondary | ICD-10-CM | POA: Diagnosis not present

## 2022-01-01 LAB — BASIC METABOLIC PANEL
Anion gap: 13 (ref 5–15)
BUN: 57 mg/dL — ABNORMAL HIGH (ref 8–23)
CO2: 24 mmol/L (ref 22–32)
Calcium: 8.7 mg/dL — ABNORMAL LOW (ref 8.9–10.3)
Chloride: 97 mmol/L — ABNORMAL LOW (ref 98–111)
Creatinine, Ser: 1.76 mg/dL — ABNORMAL HIGH (ref 0.44–1.00)
GFR, Estimated: 28 mL/min — ABNORMAL LOW (ref >60)
Glucose, Bld: 196 mg/dL — ABNORMAL HIGH (ref 70–99)
Potassium: 4.2 mmol/L (ref 3.5–5.1)
Sodium: 134 mmol/L — ABNORMAL LOW (ref 135–145)

## 2022-01-01 LAB — GLUCOSE, CAPILLARY
Glucose-Capillary: 163 mg/dL — ABNORMAL HIGH (ref 70–99)
Glucose-Capillary: 150 mg/dL — ABNORMAL HIGH (ref 70–99)
Glucose-Capillary: 179 mg/dL — ABNORMAL HIGH (ref 70–99)
Glucose-Capillary: 249 mg/dL — ABNORMAL HIGH (ref 70–99)

## 2022-01-01 LAB — CBC
HCT: 30.4 % — ABNORMAL LOW (ref 36.0–46.0)
Hemoglobin: 9.5 g/dL — ABNORMAL LOW (ref 12.0–15.0)
MCH: 24 pg — ABNORMAL LOW (ref 26.0–34.0)
MCHC: 31.3 g/dL (ref 30.0–36.0)
MCV: 76.8 fL — ABNORMAL LOW (ref 80.0–100.0)
Platelets: 118 10*3/uL — ABNORMAL LOW (ref 150–400)
RBC: 3.96 MIL/uL (ref 3.87–5.11)
RDW: 17.6 % — ABNORMAL HIGH (ref 11.5–15.5)
WBC: 8.4 10*3/uL (ref 4.0–10.5)
nRBC: 0 % (ref 0.0–0.2)

## 2022-01-01 MED ORDER — BUDESONIDE 0.5 MG/2ML IN SUSP
0.5000 mg | Freq: Two times a day (BID) | RESPIRATORY_TRACT | Status: DC
  Administered 2022-01-01 – 2022-01-08 (×13): 0.5 mg via RESPIRATORY_TRACT
  Filled 2022-01-01 (×13): qty 2

## 2022-01-01 MED ORDER — BUDESONIDE 0.5 MG/2ML IN SUSP: 0.5000 mg | Freq: Two times a day (BID) | RESPIRATORY_TRACT | Status: DC

## 2022-01-01 MED ORDER — SODIUM CHLORIDE 0.9 % IV SOLN
250.0000 mg | Freq: Once | INTRAVENOUS | Status: AC
  Administered 2022-01-01: 250 mg via INTRAVENOUS
  Filled 2022-01-01: qty 20

## 2022-01-01 MED ORDER — UMECLIDINIUM-VILANTEROL 62.5-25 MCG/ACT IN AEPB
1.0000 | INHALATION_SPRAY | Freq: Every day | RESPIRATORY_TRACT | Status: DC
  Filled 2022-01-01: qty 14

## 2022-01-01 MED ORDER — UMECLIDINIUM-VILANTEROL 62.5-25 MCG/ACT IN AEPB
1.0000 | INHALATION_SPRAY | Freq: Every day | RESPIRATORY_TRACT | Status: DC
  Administered 2022-01-02 – 2022-01-08 (×7): 1 via RESPIRATORY_TRACT
  Filled 2022-01-01 (×2): qty 14

## 2022-01-01 NOTE — Plan of Care (Signed)
  Problem: Clinical Measurements: Goal: Will remain free from infection Outcome: Progressing Goal: Respiratory complications will improve Outcome: Progressing   Problem: Activity: Goal: Risk for activity intolerance will decrease Outcome: Progressing   Problem: Safety: Goal: Ability to remain free from injury will improve Outcome: Progressing   

## 2022-01-01 NOTE — Inpatient Diabetes Management (Signed)
Inpatient Diabetes Program Recommendations  AACE/ADA: New Consensus Statement on Inpatient Glycemic Control (2015)  Target Ranges:  Prepandial:   less than 140 mg/dL      Peak postprandial:   less than 180 mg/dL (1-2 hours)      Critically ill patients:  140 - 180 mg/dL   Lab Results  Component Value Date   GLUCAP 163 (H) 01/01/2022   HGBA1C 6.8 (H) 12/29/2021    Review of Glycemic Control  Latest Reference Range & Units 12/31/21 08:07 12/31/21 11:37 12/31/21 16:43 12/31/21 20:36 01/01/22 08:07  Glucose-Capillary 70 - 99 mg/dL 151 (H) 162 (H) 220 (H) 319 (H) 163 (H)   Diabetes history: DM 2 Outpatient Diabetes medications: Amaryl 2 mg Daily Current orders for Inpatient glycemic control:  Novolog 0-9 units tid  PO prednisone 40 mg Daily A1c 6.8% on 9/29  Inpatient Diabetes Program Recommendations:    Note postprandial glucose trends increase after prednisone dose. Pt not eating enough to add Novolog meal coverage.  -  Add Novolog hs scale  Thanks,  Tama Headings RN, MSN, BC-ADM Inpatient Diabetes Coordinator Team Pager [phone] (8a-5p)

## 2022-01-01 NOTE — Evaluation (Signed)
Occupational Therapy Evaluation Patient Details Name: Ann Fletcher MRN: [id number] DOB: 1933-03-26 Today's Date: 01/01/2022   History of Present Illness 88 year old person  who presented 12/29/21 with several days of cough and upper respiratory symptoms, found to be COVID-positive, and admitted for increased work of breathing with ambulation and wheezing.   PMH significant of ILD w/ possible COPD (never smoker), A-fib status post ablation with permanent pacemaker, iron deficiency anemia, CKD 4, hypertension   Clinical Impression   PTA, pt was living with his husband and performed ADLs with assistance from PCA for bathing/dressing as well as using rollator for mobility. Pt currently requiring Min A for LB ADLs and Min Guard A for functional mobility with rollator. SpO2 in 90s on RA. Pt would benefit from further acute OT to facilitate safe dc. If able to have 24/7 support at home, could dc to home. However, due to decreased support, recommend dc to SNF for further OT to optimize safety, independence with ADLs, and return to PLOF.       Recommendations for follow up therapy are one component of a multi-disciplinary discharge planning process, led by the attending physician.  Recommendations may be updated based on patient status, additional functional criteria and insurance authorization.   Follow Up Recommendations  Skilled nursing-short term rehab (<3 hours/day)    Assistance Recommended at Discharge Frequent or constant Supervision/Assistance  Patient can return home with the following A little help with walking and/or transfers;A little help with bathing/dressing/bathroom    Functional Status Assessment  Patient has had a recent decline in their functional status and demonstrates the ability to make significant improvements in function in a reasonable and predictable amount of time.  Equipment Recommendations  BSC/3in1    Recommendations for Other Services       Precautions /  Restrictions Precautions Precautions: Fall Precaution Comments: denies falls      Mobility Bed Mobility Overal bed mobility: Needs Assistance Bed Mobility: Supine to Sit     Supine to sit: Min assist     General bed mobility comments: Min A for elevating trunk    Transfers Overall transfer level: Needs assistance Equipment used: Rolling walker (2 wheels) Transfers: Sit to/from Stand Sit to Stand: Min guard           General transfer comment: Min Guard A for safety      Balance Overall balance assessment: Needs assistance Sitting-balance support: No upper extremity supported Sitting balance-Leahy Scale: Good     Standing balance support: Bilateral upper extremity supported, Single extremity supported, During functional activity Standing balance-Leahy Scale: Poor Standing balance comment: reliant on atleast one UE                           ADL either performed or assessed with clinical judgement   ADL Overall ADL's : Needs assistance/impaired Eating/Feeding: Set up;Sitting   Grooming: Wash/dry face;Brushing hair;Wash/dry hands;Min guard;Standing Grooming Details (indicate cue type and reason): Min Guard A for safety Upper Body Bathing: Supervision/ safety;Set up;Sitting   Lower Body Bathing: Min guard;Sit to/from stand   Upper Body Dressing : Supervision/safety;Set up;Sitting   Lower Body Dressing: Minimal assistance;Sit to/from stand   Toilet Transfer: Min guard;Ambulation;BSC/3in1;Rolling walker (2 wheels) (BSC over toilet)           Functional mobility during ADLs: Min guard;Rolling walker (2 wheels) General ADL Comments: Pt demonstrating decreased activity tolerance     Vision         Perception  Praxis      Pertinent Vitals/Pain Pain Assessment Pain Assessment: No/denies pain     Hand Dominance Right   Extremity/Trunk Assessment Upper Extremity Assessment Upper Extremity Assessment: Generalized weakness   Lower  Extremity Assessment Lower Extremity Assessment: Defer to PT evaluation   Cervical / Trunk Assessment Cervical / Trunk Assessment: Kyphotic   Communication Communication Communication: HOH   Cognition Arousal/Alertness: Awake/alert Behavior During Therapy: WFL for tasks assessed/performed Overall Cognitive Status: Within Functional Limits for tasks assessed                                       General Comments  SpO2 maintaining in 90s on RA    Exercises     Shoulder Instructions      Home Living Family/patient expects to be discharged to:: Private residence Living Arrangements: Spouse/significant other Available Help at Discharge: Personal care attendant Type of Home: Apartment Home Access: Ramped entrance     Home Layout: One level     Bathroom Shower/Tub: Teacher, early years/pre: Standard     Home Equipment: Rollator (4 wheels);Tub bench          Prior Functioning/Environment Prior Level of Function : Needs assist           ADLs (physical): Bathing;Dressing;IADLs Mobility Comments: patient denies need for assist; uses rollator that does not fit in her bathroom (she holds wall, etc to get into bathroom); only does shower transfers when aide present ADLs Comments: PCA does heavy IADLs. aide assists with bathing and dressing. Patient reminds her husband to take his medication        OT Problem List: Decreased strength;Decreased range of motion;Decreased activity tolerance;Impaired balance (sitting and/or standing);Decreased knowledge of use of DME or AE;Decreased knowledge of precautions      OT Treatment/Interventions: Self-care/ADL training;Therapeutic exercise;Energy conservation;DME and/or AE instruction;Therapeutic activities;Patient/family education    OT Goals(Current goals can be found in the care plan section) Acute Rehab OT Goals Patient Stated Goal: Go home OT Goal Formulation: With patient Time For Goal  Achievement: 01/15/22 Potential to Achieve Goals: Good  OT Frequency: Min 2X/week    Co-evaluation              AM-PAC OT "6 Clicks" Daily Activity     Outcome Measure Help from another person eating meals?: None Help from another person taking care of personal grooming?: A Little Help from another person toileting, which includes using toliet, bedpan, or urinal?: A Little Help from another person bathing (including washing, rinsing, drying)?: A Little Help from another person to put on and taking off regular upper body clothing?: A Little Help from another person to put on and taking off regular lower body clothing?: A Little 6 Click Score: 19   End of Session Equipment Utilized During Treatment: Rolling walker (2 wheels) Nurse Communication: Mobility status  Activity Tolerance: Patient tolerated treatment well Patient left: in chair;with call bell/phone within reach;with chair alarm set  OT Visit Diagnosis: Unsteadiness on feet (R26.81);Other abnormalities of gait and mobility (R26.89);Muscle weakness (generalized) (M62.81)                Time: 9935-7017 OT Time Calculation (min): 46 min Charges:  OT General Charges $OT Visit: 1 Visit OT Evaluation $OT Eval Moderate Complexity: 1 Mod OT Treatments $Self Care/Home Management : 23-37 mins  Solae Norling MSOT, OTR/L Acute Rehab Office: [place]  01/01/2022, 1:14 PM

## 2022-01-01 NOTE — Plan of Care (Signed)
  Problem: Education: Goal: Knowledge of General Education information will improve Description Including pain rating scale, medication(s)/side effects and non-pharmacologic comfort measures Outcome: Progressing   Problem: Clinical Measurements: Goal: Respiratory complications will improve Outcome: Progressing   Problem: Safety: Goal: Ability to remain free from injury will improve Outcome: Progressing   Problem: Skin Integrity: Goal: Risk for impaired skin integrity will decrease Outcome: Progressing   

## 2022-01-02 ENCOUNTER — Encounter (HOSPITAL_COMMUNITY): Payer: Self-pay | Admitting: Internal Medicine

## 2022-01-02 DIAGNOSIS — M19042 Primary osteoarthritis, left hand: Secondary | ICD-10-CM | POA: Diagnosis present

## 2022-01-02 DIAGNOSIS — E1165 Type 2 diabetes mellitus with hyperglycemia: Secondary | ICD-10-CM | POA: Diagnosis present

## 2022-01-02 DIAGNOSIS — H9191 Unspecified hearing loss, right ear: Secondary | ICD-10-CM | POA: Diagnosis present

## 2022-01-02 DIAGNOSIS — M19041 Primary osteoarthritis, right hand: Secondary | ICD-10-CM | POA: Diagnosis present

## 2022-01-02 DIAGNOSIS — E039 Hypothyroidism, unspecified: Secondary | ICD-10-CM | POA: Diagnosis present

## 2022-01-02 DIAGNOSIS — E611 Iron deficiency: Secondary | ICD-10-CM | POA: Diagnosis present

## 2022-01-02 DIAGNOSIS — J690 Pneumonitis due to inhalation of food and vomit: Secondary | ICD-10-CM | POA: Diagnosis present

## 2022-01-02 DIAGNOSIS — I130 Hypertensive heart and chronic kidney disease with heart failure and stage 1 through stage 4 chronic kidney disease, or unspecified chronic kidney disease: Secondary | ICD-10-CM | POA: Diagnosis present

## 2022-01-02 DIAGNOSIS — D631 Anemia in chronic kidney disease: Secondary | ICD-10-CM | POA: Diagnosis present

## 2022-01-02 DIAGNOSIS — D509 Iron deficiency anemia, unspecified: Secondary | ICD-10-CM | POA: Diagnosis present

## 2022-01-02 DIAGNOSIS — N184 Chronic kidney disease, stage 4 (severe): Secondary | ICD-10-CM | POA: Diagnosis present

## 2022-01-02 DIAGNOSIS — U071 COVID-19: Secondary | ICD-10-CM | POA: Diagnosis present

## 2022-01-02 DIAGNOSIS — J849 Interstitial pulmonary disease, unspecified: Secondary | ICD-10-CM | POA: Diagnosis present

## 2022-01-02 DIAGNOSIS — E785 Hyperlipidemia, unspecified: Secondary | ICD-10-CM | POA: Diagnosis present

## 2022-01-02 DIAGNOSIS — M810 Age-related osteoporosis without current pathological fracture: Secondary | ICD-10-CM | POA: Diagnosis present

## 2022-01-02 DIAGNOSIS — J441 Chronic obstructive pulmonary disease with (acute) exacerbation: Secondary | ICD-10-CM | POA: Diagnosis present

## 2022-01-02 DIAGNOSIS — E1122 Type 2 diabetes mellitus with diabetic chronic kidney disease: Secondary | ICD-10-CM | POA: Diagnosis present

## 2022-01-02 DIAGNOSIS — I5032 Chronic diastolic (congestive) heart failure: Secondary | ICD-10-CM | POA: Diagnosis present

## 2022-01-02 DIAGNOSIS — J449 Chronic obstructive pulmonary disease, unspecified: Secondary | ICD-10-CM | POA: Diagnosis not present

## 2022-01-02 LAB — CBC
HCT: 30.5 % — ABNORMAL LOW (ref 36.0–46.0)
Hemoglobin: 9.2 g/dL — ABNORMAL LOW (ref 12.0–15.0)
MCH: 23.4 pg — ABNORMAL LOW (ref 26.0–34.0)
MCHC: 30.2 g/dL (ref 30.0–36.0)
MCV: 77.6 fL — ABNORMAL LOW (ref 80.0–100.0)
Platelets: 115 10*3/uL — ABNORMAL LOW (ref 150–400)
RBC: 3.93 MIL/uL (ref 3.87–5.11)
RDW: 17.6 % — ABNORMAL HIGH (ref 11.5–15.5)
WBC: 8 10*3/uL (ref 4.0–10.5)
nRBC: 0 % (ref 0.0–0.2)

## 2022-01-02 LAB — BASIC METABOLIC PANEL
Anion gap: 11 (ref 5–15)
BUN: 50 mg/dL — ABNORMAL HIGH (ref 8–23)
CO2: 27 mmol/L (ref 22–32)
Calcium: 8.8 mg/dL — ABNORMAL LOW (ref 8.9–10.3)
Chloride: 98 mmol/L (ref 98–111)
Creatinine, Ser: 1.64 mg/dL — ABNORMAL HIGH (ref 0.44–1.00)
GFR, Estimated: 30 mL/min — ABNORMAL LOW (ref >60)
Glucose, Bld: 230 mg/dL — ABNORMAL HIGH (ref 70–99)
Potassium: 4.7 mmol/L (ref 3.5–5.1)
Sodium: 136 mmol/L (ref 135–145)

## 2022-01-02 LAB — GLUCOSE, CAPILLARY
Glucose-Capillary: 214 mg/dL — ABNORMAL HIGH (ref 70–99)
Glucose-Capillary: 278 mg/dL — ABNORMAL HIGH (ref 70–99)
Glucose-Capillary: 224 mg/dL — ABNORMAL HIGH (ref 70–99)

## 2022-01-02 NOTE — Progress Notes (Signed)
Physical Therapy Treatment Patient Details Name: Ann Fletcher MRN: [id number] DOB: November 01, 1932 Today's Date: 01/02/2022   History of Present Illness 88 year old person  who presented 12/29/21 with several days of cough and upper respiratory symptoms, found to be COVID-positive, and admitted for increased work of breathing with ambulation and wheezing.   PMH significant of ILD w/ possible COPD (never smoker), A-fib status post ablation with permanent pacemaker, iron deficiency anemia, CKD 4, hypertension    PT Comments    Pt was seen for mobility on RW today with supervised to min guard assistance only, and OL2 sats are steady and maintained for gait.  Pt is expecting to go to rehab but continues to talk about going home with her caregiver.  Follow up with her for SNF discharge, to increase safety and independence as tolerated to shorten her stay in rehab.  Pt is motivated to work and despite being tired today is very much increasing her effort and progress.   Recommendations for follow up therapy are one component of a multi-disciplinary discharge planning process, led by the attending physician.  Recommendations may be updated based on patient status, additional functional criteria and insurance authorization.  Follow Up Recommendations  Skilled nursing-short term rehab (<3 hours/day) Can patient physically be transported by private vehicle: Yes   Assistance Recommended at Discharge Intermittent Supervision/Assistance  Patient can return home with the following A little help with walking and/or transfers;A lot of help with bathing/dressing/bathroom;Assistance with cooking/housework;Help with stairs or ramp for entrance   Equipment Recommendations  None recommended by PT    Recommendations for Other Services       Precautions / Restrictions Precautions Precautions: Fall     Mobility  Bed Mobility Overal bed mobility: Needs Assistance             General bed mobility comments:  in chair when PT arrives    Transfers Overall transfer level: Needs assistance Equipment used: Rolling walker (2 wheels)   Sit to Stand: Min guard                Ambulation/Gait Ambulation/Gait assistance: Min guard Gait Distance (Feet): 70 Feet Assistive device: Rolling walker (2 wheels) Gait Pattern/deviations: Step-through pattern, Decreased stride length, Narrow base of support Gait velocity: reduced, controlled Gait velocity interpretation: <1.31 ft/sec, indicative of household ambulator   General Gait Details: turning slowly and O2 sats are >90% after walk   Chief Strategy Officer    Modified Rankin (Stroke Patients Only)       Balance Overall balance assessment: Needs assistance Sitting-balance support: Feet supported Sitting balance-Leahy Scale: Good     Standing balance support: Bilateral upper extremity supported, During functional activity Standing balance-Leahy Scale: Poor                              Cognition Arousal/Alertness: Awake/alert Behavior During Therapy: WFL for tasks assessed/performed Overall Cognitive Status: Within Functional Limits for tasks assessed                                          Exercises      General Comments General comments (skin integrity, edema, etc.): O2 on room air is 90% or greater      Pertinent Vitals/Pain Pain Assessment Pain Assessment: No/denies pain  Home Living                          Prior Function            PT Goals (current goals can now be found in the care plan section) Acute Rehab PT Goals Patient Stated Goal: get home Progress towards PT goals: Progressing toward goals    Frequency    Min 3X/week      PT Plan Current plan remains appropriate    Co-evaluation              AM-PAC PT "6 Clicks" Mobility   Outcome Measure  Help needed turning from your back to your side while in a flat bed without  using bedrails?: A Little Help needed moving from lying on your back to sitting on the side of a flat bed without using bedrails?: A Little Help needed moving to and from a bed to a chair (including a wheelchair)?: A Little Help needed standing up from a chair using your arms (e.g., wheelchair or bedside chair)?: A Little Help needed to walk in hospital room?: A Little Help needed climbing 3-5 steps with a railing? : A Lot 6 Click Score: 17    End of Session Equipment Utilized During Treatment: Gait belt Activity Tolerance: Patient tolerated treatment well Patient left: in chair;with call bell/phone within reach;with chair alarm set Nurse Communication: Mobility status;Other (comment) PT Visit Diagnosis: Muscle weakness (generalized) (M62.81);Difficulty in walking, not elsewhere classified (R26.2)     Time: 0354-6568 PT Time Calculation (min) (ACUTE ONLY): 28 min  Charges:  $Gait Training: 8-22 mins $Therapeutic Activity: 8-22 mins Ramond Dial 01/02/2022, 4:58 PM  Mee Hives, PT PhD Acute Rehab Dept. Number: [place] [facility] [place]

## 2022-01-02 NOTE — Progress Notes (Signed)
   Subjective:  Patient continues to feel that her breathing is improved.  She notes that her husband is now in the emergency department after a fall.  In addition her caregiver who helps with some of her ADLs is sick with COVID.  Patient was able to ambulate without any desaturations.  She denies any chest pain, wheezing, dyspnea, she has been able to eat and drink without any difficulty.  Objective:  Vital signs in last 24 hours: Vitals:   01/01/22 2149 01/01/22 2200 01/01/22 2207   BP:  (!) 186/88 (!) 160/61   Pulse:      Resp:      Temp: 97.6 F (36.4 C)     TempSrc: Oral     SpO2:      Weight:      Height:         Constitutional: Well-developed, well-nourished, and in no distress.  HENT:  Head: Normocephalic and atraumatic.  Eyes: EOM are normal.  Neck: Normal range of motion.  Cardiovascular: Normal rate, regular rhythm, intact distal pulses. No gallop and no friction rub.  No murmur heard. Trace lower extremity edema  Pulmonary: Non labored breathing on room air, no rales. Wheezing throughout lung fields  Abdominal: Soft. Normal bowel sounds. Non distended and non tender Musculoskeletal: Normal range of motion.        General: No tenderness Neurological: Alert and oriented to person, place, and time. Non focal  Skin: Skin is warm and dry.    Assessment/Plan:  Principal Problem:   COPD with acute exacerbation [facility])  [name] is a 88 year old female with a history significant for COPD/ILD, atrial fibrillation (not on anticoagulation d/t anemia) status post ablation and permanent pacemaker 08/23/2017, IDA (unclear etiology), CKD 4, HTN, and T2DM who p/w URI symptoms found to be COVID + with noted wheezing and increased work of breathing on exam concerning for COPD exacerbation.   #COPD/ILD exacerbation 2/2 COVID infection Patient notes her symptoms continue to improve.  Her vital signs are stable and she has no new hypoxemia.She does have some wheezing on exam.   -Continue oral prednisone -Continue LABA/LAMA/ICS   #H/o atrial fibrillation s/p AV nodal ablation, ppm not on AC d/t chronic anemia  Patient is ventricularly paced. -Will continue to monitor  #HFpEF #HTN On lasix only for her HFpEF. Patient's therapy is limited by her CKD. She appears euvolemic to mildly hypervolemic on exam (trace to 1+ edema of BLE).  -Continue home lasix dose  #CKD4 Stable.  Avoid nephrotoxic medications.   #IDA  Hgb stable at baseline.   #T2DM  A1c 6.8 ~1 year ago. She is unsure what medicines she takes for this. Will continue to hold her home medications listed in the chart and continue sliding scale.   Prior to Admission Living Arrangement: Home  Anticipated Discharge Location: SNF  Dispo: Anticipated discharge in approximately 2 day(s).   Rick Duff, MD After 5pm on weekdays and 1pm on weekends: On Call pager [phone]

## 2022-01-02 NOTE — Plan of Care (Signed)
  Problem: Skin Integrity: Goal: Risk for impaired skin integrity will decrease Outcome: Progressing   Problem: Clinical Measurements: Goal: Will remain free from infection Outcome: Progressing Goal: Respiratory complications will improve Outcome: Progressing   Problem: Safety: Goal: Ability to remain free from injury will improve Outcome: Progressing

## 2022-01-02 NOTE — Progress Notes (Signed)
Per [facility], pt's home health aide will be available to return to assisting pt and husband as of tomorrow. Currently scheduled for 2 hours each, 7 days a week.

## 2022-01-02 NOTE — TOC Initial Note (Signed)
Transition of Care [facility]) - Initial/Assessment Note    Patient Details  Name: Ann Fletcher MRN: [id number] Date of Birth: [birth year]  [facility] CM/SW Contact:    Joanne Chars, LCSW Phone Number: 01/02/2022, 8:53 AM  Clinical Narrative:     Pt covid positive, CSW spoke with pt by phone regarding DC recommendation for SNF.  Pt able to engage in conversation but not sure how much she was grasping regarding need for DC plan.  Permission given by pt to speak with daughter Watt Climes, son Sonia Side.  CSW spoke with Jennings Books, who clarifies that she is not daughter but is friend who is here in [place].  Pt son Sonia Side lives in [place], hard to get a hold of, daughter in law Caryl Asp is a good contact: [phone].  Pt is actually active with [facility] in [place], lives with husband at home, has [facility] aide in home M-F for 4 hours per day.  Husband is also hospitalized at [facility] currently.  CSW spoke with Pearlie Oyster at [facility], she confirmed that pt is active with them, would be eligible for SNF.  Discussed pt current status with PT, covid +, may not need SNF based on mobility after she completes her quarantine (postive covid on 9/28)  [place] Ar will talk with people at [facility] about options.  1530: TC [place].  She has spoken to pt and pt only wants to be placed if her husband is also placed in the same facility.  She is not sure what DC plan for husband is currently.  ALF would be option for pt as well and [facility] covers this.               Expected Discharge Plan:  (TBD) Barriers to Discharge: Continued Medical Work up   Patient Goals and CMS Choice        Expected Discharge Plan and Services Expected Discharge Plan:  (TBD) In-house Referral: Clinical Social Work   Post Acute Care Choice:  (TBD) Living arrangements for the past 2 months: Single Family Home                                      Prior Living Arrangements/Services Living arrangements for the past 2  months: Single Family Home Lives with:: Spouse Patient language and need for interpreter reviewed:: Yes        Need for Family Participation in Patient Care: Yes (Comment) Care giver support system in place?: Yes (comment) Current home services: Homehealth aide (M-F, 4 hours per day) Criminal Activity/Legal Involvement Pertinent to Current Situation/Hospitalization: No - Comment as needed  Activities of Daily Living Home Assistive Devices/Equipment: Built-in shower seat, Dentures (specify type), Eyeglasses, Grab bars around toilet, Walker (specify type) ADL Screening (condition at time of admission) Patient's cognitive ability adequate to safely complete daily activities?: Yes Is the patient deaf or have difficulty hearing?: Yes Does the patient have difficulty seeing, even when wearing glasses/contacts?: No Does the patient have difficulty concentrating, remembering, or making decisions?: No Patient able to express need for assistance with ADLs?: Yes Does the patient have difficulty dressing or bathing?: Yes Independently performs ADLs?: No Communication: Independent Dressing (OT): Needs assistance Is this a change from baseline?: Pre-admission baseline Grooming: Independent Feeding: Independent Bathing: Needs assistance Is this a change from baseline?: Pre-admission baseline Toileting: Needs assistance Is this a change from baseline?: Pre-admission baseline In/Out Bed: Needs assistance Is  this a change from baseline?: Pre-admission baseline Walks in Home: Independent with device (comment) Does the patient have difficulty walking or climbing stairs?: Yes Weakness of Legs: Both Weakness of Arms/Hands: None  Permission Sought/Granted Permission sought to share information with : Family Supports Permission granted to share information with : Yes, Verbal Permission Granted  Share Information with NAME: daughter Watt Climes, son Sonia Side           Emotional Assessment Appearance::   (unknown: phone assessment due to covid +) Attitude/Demeanor/Rapport: Engaged Affect (typically observed): Pleasant Orientation: : Oriented to Self, Oriented to Place, Oriented to  Time, Oriented to Situation Alcohol / Substance Use: Not Applicable Psych Involvement: No (comment)  Admission diagnosis:  COPD exacerbation ([place]) [J44.1] COPD with acute exacerbation ([place]) [J44.1] COVID-19 [U07.1] Patient Active Problem List   Diagnosis Date Noted   COPD with acute exacerbation ([place]) 12/29/2021   Abnormal gait 02/01/2021   Acquired hypothyroidism 02/01/2021   Chronic renal impairment, stage 3b ([place]) 02/01/2021   Adult general medical exam 02/01/2021   Hardening of the aorta (main artery of the heart) ([place]) 02/01/2021   Hyperglycemia due to type 2 diabetes mellitus ([place]) 02/01/2021   Lumbar radiculopathy 02/01/2021   Menopause 02/01/2021   Vertigo 02/01/2021   C. 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   Diverticular disease 12/20/2017   History of adenomatous polyp of colon 12/20/2017   Anemia 11/19/2017   Anorexia 11/19/2017   Interstitial lung disease ([place]) 11/07/2017   Iron deficiency anemia    Acute on chronic diastolic CHF (congestive heart failure) ([place])    Status post atrioventricular nodal ablation 08/24/2017   Chronic diastolic CHF (congestive heart failure) ([place]) 08/24/2017   Type 2 diabetes mellitus with renal manifestations, controlled ([place]) 03/24/2015   Essential hypertension 02/17/2014   Hyperlipidemia 02/17/2014   Lower extremity edema 02/17/2014   Right carotid bruit 02/17/2014   PCP:   [facility], [place]:  No Pharmacies Listed    Social Determinants of Health (SDOH) Interventions    Readmission Risk Interventions    02/09/2020    2:11 PM  Readmission Risk Prevention Plan  Transportation Screening Complete  PCP or Specialist Appt within 5-7 Days Complete  Medication Review (RN CM) Complete

## 2022-01-02 NOTE — TOC Progression Note (Addendum)
Transition of Care [facility]) - Progression Note    Patient Details  Name: Ann Fletcher MRN: [id number] Date of Birth: May 27, 1932  Transition of Care [facility]) CM/SW Contact  Joanne Chars, LCSW Phone Number: 01/02/2022, 10:03 AM  Clinical Narrative:    CSW spoke with Cynthia/TOC 4E.  She is working with pt husband, Beretta Ginsberg.  He is confused currently, has mittens on, no recommendations for DC plan have been made.  CSW LM with pt son Sonia Side. CSW spoke with pt DIL, Joy.  She confirmed that Sonia Side is POA.  Joy/Jerry are in [place].  Pt and husband have been living together.  Latania is still mentally sharp but unable to do much physically anymore--husband has cooked and assisted taking care of her.  Charizma was at [facility] in past, husband did fine home alone, but Moesha would not be able to make it home alone currently.  They are interested in ALF placement and were told by [facility] this was possible in [place] but not [place].  They do not have funds to pay for ALF or placement in [place].  CSW spoke with [place] at [facility]. She corrected CSW that [facility] does not pay for ALF but does cover LTC at one of their SNF facilities. [facility] is also not available in [place] so would not be a payer for facilities there.  She is being asked to get input from both patients regarding their wishes, cannot do that with husband still being confused.  Will stay in touch.   1510-referral sent out for SNF to [facility] facilities    Expected Discharge Plan:  (TBD) Barriers to Discharge: Continued Medical Work up  Expected Discharge Plan and Services Expected Discharge Plan:  (TBD) In-house Referral: Clinical Social Work   Post Acute Care Choice:  (TBD) Living arrangements for the past 2 months: Single Family Home                                       Social Determinants of Health (SDOH) Interventions    Readmission Risk Interventions    02/09/2020    2:11 PM  Readmission Risk Prevention Plan   Transportation Screening Complete  PCP or Specialist Appt within 5-7 Days Complete  Medication Review (RN CM) Complete

## 2022-01-02 NOTE — NC FL2 (Signed)
[place] LEVEL OF CARE SCREENING TOOL     IDENTIFICATION  Patient Name: Ann Fletcher Birthdate: 04/02/1933 Sex: female Admission Date (Current Location): 12/28/2021  [facility] and [place] Number:  Herbalist and Address:  [facility] [place], [place] [address] [place], [place] [address]      Provider Number: [id number]  Attending Physician Name and Address:  Velna Ochs, MD  Relative Name and Phone Number:  [facility] Other [phone]    Current Level of Care: Hospital Recommended Level of Care: [place] Prior Approval Number:    Date Approved/Denied:   PASRR Number: [id number]  Discharge Plan: SNF    Current Diagnoses: Patient Active Problem List   Diagnosis Date Noted   COPD with acute exacerbation ([place]) 12/29/2021   Abnormal gait 02/01/2021   Acquired hypothyroidism 02/01/2021   Chronic renal impairment, stage 3b ([place]) 02/01/2021   Adult general medical exam 02/01/2021   Hardening of the aorta (main artery of the heart) ([place]) 02/01/2021   Hyperglycemia due to type 2 diabetes mellitus ([place]) 02/01/2021   Lumbar radiculopathy 02/01/2021   Menopause 02/01/2021   Vertigo 02/01/2021   C. 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) 11/07/2017   Iron deficiency anemia    Acute on chronic diastolic CHF (congestive heart failure) ([place])    Status post atrioventricular nodal  ablation 08/24/2017   Chronic diastolic CHF (congestive heart failure) ([place]) 08/24/2017   Type 2 diabetes mellitus with renal manifestations, controlled ([place]) 03/24/2015   Essential hypertension 02/17/2014   Hyperlipidemia 02/17/2014   Lower extremity edema 02/17/2014   Right carotid bruit 02/17/2014    Orientation RESPIRATION BLADDER Height & Weight     Self, Time, Situation, Place  Normal Incontinent, External catheter Weight: 146 lb 6.2 oz (66.4 kg) Height:  '5\' 3"'$  (160 cm)  BEHAVIORAL SYMPTOMS/MOOD NEUROLOGICAL BOWEL NUTRITION STATUS      Continent Diet (see discharge summary)  AMBULATORY STATUS COMMUNICATION OF NEEDS Skin   Supervision Verbally Normal                       Personal Care Assistance Level of Assistance  Bathing, Feeding, Dressing Bathing Assistance: Limited assistance Feeding assistance: Limited assistance Dressing Assistance: Limited assistance     Functional Limitations Info  Sight, Hearing, Speech Sight Info: Adequate Hearing Info: Adequate Speech Info: Adequate    SPECIAL CARE FACTORS FREQUENCY  PT (By licensed PT), OT (By licensed OT)     PT Frequency: 5x week OT Frequency: 5x week            Contractures Contractures Info: Not present    Additional Factors Info  Code Status, Allergies, Insulin Sliding Scale Code Status Info: full Allergies Info: Tizanidine Hcl, Nsaids, Pradaxa (Dabigatran Etexilate Mesylate), Sulfa Antibiotics, Other, Betadine (Povidone Iodine)   Insulin Sliding Scale Info: Novolog: see discharge summary       Current Medications (01/02/2022):  This is the current hospital active medication list Current Facility-Administered  Medications  Medication Dose Route Frequency Provider Last Rate Last Admin   acetaminophen (TYLENOL) tablet 650 mg  650 mg Oral Q6H PRN Fredia Sorrow, MD   650 mg at 12/30/21 1041   aspirin EC tablet 81 mg  81 mg Oral Daily Rick Duff, MD   81 mg at 01/02/22 1028   atorvastatin  (LIPITOR) tablet 40 mg  40 mg Oral QHS Rick Duff, MD   40 mg at 01/01/22 2142   budesonide (PULMICORT) nebulizer solution 0.5 mg  0.5 mg Nebulization BID Velna Ochs, MD   0.5 mg at 01/02/22 0831   furosemide (LASIX) tablet 40 mg  40 mg Oral Daily Rick Duff, MD   40 mg at 01/02/22 1027   insulin aspart (novoLOG) injection 0-9 Units  0-9 Units Subcutaneous TID WC Rick Duff, MD   5 Units at 01/02/22 1301   ipratropium-albuterol (DUONEB) 0.5-2.5 (3) MG/3ML nebulizer solution 3 mL  3 mL Nebulization Q2H PRN Charise Killian, MD   3 mL at 01/01/22 0534   levothyroxine (SYNTHROID) tablet 112 mcg  112 mcg Oral QAC breakfast Rick Duff, MD   112 mcg at 01/02/22 3568   menthol-cetylpyridinium (CEPACOL) lozenge 3 mg  1 lozenge Oral PRN Rick Duff, MD       ondansetron [facility]) tablet 4 mg  4 mg Oral Q6H PRN Rick Duff, MD       Or   ondansetron [facility]) injection 4 mg  4 mg Intravenous Q6H PRN Rick Duff, MD   4 mg at 01/01/22 2200   Oral care mouth rinse  15 mL Mouth Rinse PRN Lucious Groves, DO       rivaroxaban Alved[facility] Reasons) tablet 10 mg  10 mg Oral Daily Rick Duff, MD   10 mg at 01/02/22 1028   umeclidinium-vilanterol (ANORO ELLIPTA) 62.5-25 MCG/ACT 1 puff  1 puff Inhalation Daily Velna Ochs, MD   1 puff at 01/02/22 0831   vitamin E capsule 400 Units  400 Units Oral Daily Rick Duff, MD   400 Units at 01/02/22 1027     Discharge Medications: Please see discharge summary for a list of discharge medications.  Relevant Imaging Results:  Relevant Lab Results:   Additional Information SSN [id number]  Joanne Chars, LCSW

## 2022-01-02 NOTE — Progress Notes (Signed)
Subjective:   Summary: Ann Fletcher is a 88 y.o. year old female currently admitted on the IMTS HD#0 for COPD/ILD exacerbation secondary to COVID infection.  Overnight Events: No overnight events  Patient's breathing is improved, she has desires to be placed in a SNF with her husband who is also currently in the hospital requires one.   Objective:  Vital signs in last 24 hours: Vitals:   01/02/22 0011 01/02/22 0800 01/02/22 0832 01/02/22 1137  BP: (!) 124/57 (!) 140/60    Pulse:  69    Resp: '17 18  19  '$ Temp: (!) 97.3 F (36.3 C) 97.6 F (36.4 C)  98 F (36.7 C)  TempSrc: Oral Oral  Oral  SpO2:  96% 95%   Weight:      Height:       Supplemental O2: Room Air SpO2: 95 % O2 Flow Rate (L/min): 1 L/min   Physical Exam:  Constitutional: well-appearing elderly woman, in no acute distress s Cardiovascular: RRR, no murmurs, rubs or gallops Pulmonary/Chest: Nonlabored breathing on room air, no rales.  Wheezing throughout lung fields abdominal: soft, non-tender, non-distended Skin: warm and dry Extremities: upper/lower extremity pulses 2+, no lower extremity edema present  [facility] Weights   12/29/21 2308  Weight: 66.4 kg     Intake/Output Summary (Last 24 hours) at 01/02/2022 1353 Last data filed at 01/02/2022 1124 Gross per 24 hour  Intake 240 ml  Output 1650 ml  Net -1410 ml   Net IO Since Admission: -2,570 mL [01/02/22 1353]  Pertinent Labs:    Latest Ref Rng & Units 01/02/2022    5:53 AM 01/01/2022    6:08 AM 12/31/2021   11:20 AM  CBC  WBC 4.0 - 10.5 K/uL 8.0  8.4  10.8   Hemoglobin 12.0 - 15.0 g/dL 9.2  9.5  9.7   Hematocrit 36.0 - 46.0 % 30.5  30.4  31.1   Platelets 150 - 400 K/uL 115  118  133        Latest Ref Rng & Units 01/02/2022    5:53 AM 01/01/2022    6:08 AM 12/31/2021   11:20 AM  CMP  Glucose 70 - 99 mg/dL 230  196  165   BUN 8 - 23 mg/dL 50  57  52   Creatinine 0.44 - 1.00 mg/dL 1.64  1.76  1.91   Sodium 135 - 145 mmol/L 136   134  136   Potassium 3.5 - 5.1 mmol/L 4.7  4.2  3.9   Chloride 98 - 111 mmol/L 98  97  99   CO2 22 - 32 mmol/L '27  24  23   '$ Calcium 8.9 - 10.3 mg/dL 8.8  8.7  8.7     Assessment/Plan:   Principal Problem:   COPD with acute exacerbation ([place])   Patient Summary: Ann Fletcher is a 88 y.o. with a pertinent PMH of COPD/ILD, atrial fibrillation status post ablation and permanent pacemaker on Aug 23, 2017, iron deficiency anemia, CKD 4, hypertension and diabetes type 2, who presented with upper respiratory infection symptoms found to be COVID-positive and admitted for COPD/ILD exacerbation.    #COPD/ILD exacerbation secondary to COVID infection Patient states her symptoms are improving.  No signs of hypoxemia.  Currently awaiting disposition to SNF, preferably with her husband who is also hospitalized at the moment. - Continue oral prednisone - Continue LABA/LAMA/ICS   #History  of atrial fibrillation status post AV nodal ablation  Currently not on anticoagulation due to chronic anemia, patient is ventricularly PACED, will continue to monitor.   #Heart failure with preserved ejection fraction #Hypertension Patient is currently on Lasix only for her HFpEF, however due to her CKD therapy is very limited.  We are continuing her home dose of Lasix   #CKD 4 Patient is currently stable we will avoid nephrotoxic medications   #Type 2 diabetes mellitus A1c was about 6.8 about a year ago.  Continue holding her home medications and continue sliding scale.  Code: Full    Dispo: Anticipated discharge in less than 2 midnights Drucie Opitz, MD PGY-1 Internal Medicine Resident Pager Number [phone] Please contact the on call pager after 5 pm and on weekends at [phone].

## 2022-01-03 DIAGNOSIS — J441 Chronic obstructive pulmonary disease with (acute) exacerbation: Secondary | ICD-10-CM | POA: Diagnosis not present

## 2022-01-03 LAB — GLUCOSE, CAPILLARY
Glucose-Capillary: 276 mg/dL — ABNORMAL HIGH (ref 70–99)
Glucose-Capillary: 140 mg/dL — ABNORMAL HIGH (ref 70–99)
Glucose-Capillary: 225 mg/dL — ABNORMAL HIGH (ref 70–99)
Glucose-Capillary: 165 mg/dL — ABNORMAL HIGH (ref 70–99)

## 2022-01-03 LAB — BASIC METABOLIC PANEL
Anion gap: 12 (ref 5–15)
BUN: 59 mg/dL — ABNORMAL HIGH (ref 8–23)
CO2: 28 mmol/L (ref 22–32)
Calcium: 9.4 mg/dL (ref 8.9–10.3)
Chloride: 98 mmol/L (ref 98–111)
Creatinine, Ser: 1.58 mg/dL — ABNORMAL HIGH (ref 0.44–1.00)
GFR, Estimated: 31 mL/min — ABNORMAL LOW (ref >60)
Glucose, Bld: 156 mg/dL — ABNORMAL HIGH (ref 70–99)
Potassium: 4.8 mmol/L (ref 3.5–5.1)
Sodium: 138 mmol/L (ref 135–145)

## 2022-01-03 MED ORDER — GUAIFENESIN ER 600 MG PO TB12: 600.0000 mg | ORAL_TABLET | Freq: Two times a day (BID) | ORAL | Status: DC

## 2022-01-03 MED ORDER — GUAIFENESIN ER 600 MG PO TB12
600.0000 mg | ORAL_TABLET | Freq: Two times a day (BID) | ORAL | Status: DC | PRN
  Administered 2022-01-03 – 2022-01-05 (×3): 600 mg via ORAL
  Filled 2022-01-03 (×3): qty 1

## 2022-01-03 NOTE — TOC Progression Note (Addendum)
Transition of Care [facility]) - Progression Note    Patient Details  Name: Ann Fletcher MRN: [id number] Date of Birth: 06/04/1932  [facility]) CM/SW Contact  Joanne Chars, LCSW Phone Number: 01/03/2022, 2:36 PM  Clinical Narrative:    One bed offer in hub: [facility].  CSW spoke with [facility] and they are reviewing to see if they have any rehab to LTC beds. CSW spoke with [facility].  She does have rehab bed but does not have LTC bed. CSW spoke with [employer], [employer] and [employer].  She does have LTC beds available for both pt and husband if needed.    CSW spoke with DIL Joy and presented bed offers.  She will review with husband and call back.   Expected Discharge Plan:  (TBD) Barriers to Discharge: Continued Medical Work up  Expected Discharge Plan and Services Expected Discharge Plan:  (TBD) In-house Referral: Clinical Social Work   Post Acute Care Choice:  (TBD) Living arrangements for the past 2 months: Single Family Home                                       Social Determinants of Health (SDOH) Interventions    Readmission Risk Interventions    02/09/2020    2:11 PM  Readmission Risk Prevention Plan  Transportation Screening Complete  PCP or Specialist Appt within 5-7 Days Complete  Medication Review (RN CM) Complete

## 2022-01-03 NOTE — Progress Notes (Addendum)
Subjective:   Summary: Ann Fletcher is a 88 y.o. year old female currently admitted on the IMTS HD#1 for COPD/ILD Exacerbation to COVID infection.  Overnight Events: NOE   Pt was seen this AM bedside. She states she is feeling much better, and her breathing has been much better.   Objective:  Vital signs in last 24 hours: Vitals:   01/03/22 0905 01/03/22 0908 01/03/22 0910 01/03/22 1139  BP:   (!) 171/92 (!) 141/64  Pulse:   72 70  Resp:   19 18  Temp:   98.1 F (36.7 C) 97.8 F (36.6 C)  TempSrc:   Oral   SpO2: 95% 96% 100% 96%  Weight:      Height:       Supplemental O2: Nasal Cannula SpO2: 96 % O2 Flow Rate (L/min): 1 L/min   Physical Exam:  Constitutional: well-appearing elderly woman, in no acute distress Cardiovascular: RRR, no murmurs, rubs or gallops Pulmonary/Chest: Nonlabored breathing on room air, no rales. Expiratory wheezes throughout lung fields  Abdominal: soft, non-tender, non-distended Skin: warm and dry Extremities: upper/lower extremity pulses 2+, no lower extremity edema present  Filed Weights   12/29/21 2308  Weight: 66.4 kg     Intake/Output Summary (Last 24 hours) at 01/03/2022 1353 Last data filed at 01/03/2022 0608 Gross per 24 hour  Intake --  Output 1200 ml  Net -1200 ml   Net IO Since Admission: -3,770 mL [01/03/22 1353]  Pertinent Labs:    Latest Ref Rng & Units 01/02/2022    5:53 AM 01/01/2022    6:08 AM 12/31/2021   11:20 AM  CBC  WBC 4.0 - 10.5 K/uL 8.0  8.4  10.8   Hemoglobin 12.0 - 15.0 g/dL 9.2  9.5  9.7   Hematocrit 36.0 - 46.0 % 30.5  30.4  31.1   Platelets 150 - 400 K/uL 115  118  133        Latest Ref Rng & Units 01/03/2022    8:25 AM 01/02/2022    5:53 AM 01/01/2022    6:08 AM  CMP  Glucose 70 - 99 mg/dL 156  230  196   BUN 8 - 23 mg/dL 59  50  57   Creatinine 0.44 - 1.00 mg/dL 1.58  1.64  1.76   Sodium 135 - 145 mmol/L 138  136  134   Potassium 3.5 - 5.1 mmol/L 4.8  4.7  4.2   Chloride  98 - 111 mmol/L 98  98  97   CO2 22 - 32 mmol/L '28  27  24   '$ Calcium 8.9 - 10.3 mg/dL 9.4  8.8  8.7     Assessment/Plan:   Principal Problem:   COPD with acute exacerbation ([place])   Patient Summary: Ann Fletcher is a 88 y.o. with a pertinent PMH of COPD/ILD, atrial fibrillation status post ablation and permanent pacemaker on Aug 23, 2017, iron deficiency anemia, CKD 4, hypertension and diabetes type 2, who presented with upper respiratory infection symptoms found to be COVID-positive and admitted for COPD/ILD exacerbation (improved)   #COPD/ILD Exacerbation Secondary to COVID Infection  Pt continues to endorse that her symptoms are improving. No signs of hypoxemia, and is currently awaiting disposition to SNF. Per SNF results, there must be 10 days between positive COVID test and admission to SNF, so patient will qualify on 01/08/22 - Continue LABA/LAMA/ICS -Continue PT/OT  #  History of Atrial Fibrillation S/P AV Nodal ablation  Currently not on anticoagulation due to chronic anemia, she is ventricular paced, will continue to monitor.    #Heart failure with Preserved Ejection Fraction  Patient is currently on Lasix only for her HFpEF, however due to her CKD therapy has been very limited. Continuing her home dose of lasix.   #CKD 4  Patient is currently stable we will avoid nephrotoxic medications  #Type 2 Diabetes Mellitus with hyperglycemia Monitor while on steroids  A1c was about 6.8 about a year ago, continue holding home medications and continue sliding scale.   Code: Full   Dispo: Anticipated discharge in less than two midnights.   Drucie Opitz, MD PGY-1 Internal Medicine Resident Pager Number [phone] Please contact the on call pager after 5 pm and on weekends at [phone].

## 2022-01-04 ENCOUNTER — Inpatient Hospital Stay (HOSPITAL_COMMUNITY): Payer: Medicare Other

## 2022-01-04 DIAGNOSIS — J441 Chronic obstructive pulmonary disease with (acute) exacerbation: Secondary | ICD-10-CM | POA: Diagnosis not present

## 2022-01-04 LAB — GLUCOSE, CAPILLARY
Glucose-Capillary: 133 mg/dL — ABNORMAL HIGH (ref 70–99)
Glucose-Capillary: 199 mg/dL — ABNORMAL HIGH (ref 70–99)
Glucose-Capillary: 278 mg/dL — ABNORMAL HIGH (ref 70–99)
Glucose-Capillary: 268 mg/dL — ABNORMAL HIGH (ref 70–99)

## 2022-01-04 NOTE — Progress Notes (Signed)
Subjective:   Summary: Ann Fletcher is a 88 y.o. year old female currently admitted on the IMTS HD#2 for COPD/ILD Exacerbation secondary to COVID infection.  Overnight Events: Pt had two episodes of emesis, one after eating dinner, and one during breathing treatment. She was given zofran and nausea + vomiting subsided    Pt was seen this AM bedside. She states she feeling better, and her breathing is improving. Was able to wean her off oxygen in the room with no desaturation noted.   Objective:  Vital signs in last 24 hours: Vitals:   01/03/22 2300 01/04/22 0817 01/04/22 0924 01/04/22 0928  BP:  133/62    Pulse:  68    Resp:  18    Temp: 99.2 F (37.3 C) 98.1 F (36.7 C)    TempSrc: Oral     SpO2:  100% 92% 100%  Weight:      Height:       Supplemental O2: Room Air SpO2: 100 % O2 Flow Rate (L/min): 2 L/min   Physical Exam:  Constitutional: well-appearing elderly woman sitting in chair, in no acute distress Cardiovascular: RRR, no murmurs, rubs or gallops Pulmonary/Chest: normal work of breathing on room air, expiratory wheezes heard throughout lung fields Abdominal: soft, non-tender, non-distended Skin: warm and dry Extremities: upper/lower extremity pulses 2+, no lower extremity edema present  Filed Weights   12/29/21 2308  Weight: 66.4 kg     Intake/Output Summary (Last 24 hours) at 01/04/2022 1317 Last data filed at 01/03/2022 2044 Gross per 24 hour  Intake --  Output 2 ml  Net -2 ml   Net IO Since Admission: -4,572 mL [01/04/22 1317]  Pertinent Labs:    Latest Ref Rng & Units 01/02/2022    5:53 AM 01/01/2022    6:08 AM 12/31/2021   11:20 AM  CBC  WBC 4.0 - 10.5 K/uL 8.0  8.4  10.8   Hemoglobin 12.0 - 15.0 g/dL 9.2  9.5  9.7   Hematocrit 36.0 - 46.0 % 30.5  30.4  31.1   Platelets 150 - 400 K/uL 115  118  133        Latest Ref Rng & Units 01/03/2022    8:25 AM 01/02/2022    5:53 AM 01/01/2022    6:08 AM  CMP  Glucose 70 - 99  mg/dL 156  230  196   BUN 8 - 23 mg/dL 59  50  57   Creatinine 0.44 - 1.00 mg/dL 1.58  1.64  1.76   Sodium 135 - 145 mmol/L 138  136  134   Potassium 3.5 - 5.1 mmol/L 4.8  4.7  4.2   Chloride 98 - 111 mmol/L 98  98  97   CO2 22 - 32 mmol/L '28  27  24   '$ Calcium 8.9 - 10.3 mg/dL 9.4  8.8  8.7       Imaging: DG Chest Port 1 View  Result Date: 01/04/2022 CLINICAL DATA:  Hypoxemia EXAM: PORTABLE CHEST 1 VIEW COMPARISON:  12/28/2021 FINDINGS: Stable positioning of left-sided implanted cardiac device. Stable cardiomegaly. Streaky bibasilar opacities. Coarsened interstitial markings bilaterally. No large pleural fluid collection. No pneumothorax. Chronic posttraumatic deformity of the proximal right humerus. IMPRESSION: Streaky bibasilar opacities, which may represent atelectasis versus pneumonia. Electronically Signed   By: Davina Poke D.O.   On: 01/04/2022 09:34     Assessment/Plan:   Principal Problem:  COPD with acute exacerbation [facility])   Patient Summary: Ann Fletcher is a 88 y.o. with a pertinent PMH of COPD/ILD, atrial fibrillation status post ablation and permanent pacemaker on Aug 23, 2017, iron deficiency anemia, CKD 4, hypertension and diabetes type 2, who presented with upper respiratory infection symptoms found to be COVID-positive and admitted for COPD/ILD exacerbation (improved)    #COPD/ILD exacerbation secondary to COVID infection Patient had 1 episode of desaturation overnight after emesis episode, however she was placed on 2 L overnight which seemed to help.  She was taken off the 2 L and is currently breathing on room air at the moment.  Patient will call 5 for SNF placement on January 08, 2022  Plan: - Continue LABA/LAMA/ICS - Continue PT/OT  #History of atrial fibrillation status post AV nodal ablation Currently not on anticoagulation due to chronic anemia, she is ventricularly paced, we will continue to monitor  #Heart failure with preserved ejection  fraction Patient's will need Lasix only for her HFpEF, however due to her CKD therapy has been very limited.  Continue her home dose of Lasix   #Type 2 diabetes mellitus with hyperglycemia We will continue to monitor as she is on steroids.  A1c was 6.8 about a year ago, continue holding home medications and continue sliding scale   #CKD 4 Patient is currently stable, will avoid nephrotoxic medications  Code: Full    Dispo: Anticipated discharge greater than 3 midnights  Drucie Opitz, MD PGY-1 Internal Medicine Resident Pager Number [phone] Please contact the on call pager after 5 pm and on weekends at [phone].

## 2022-01-04 NOTE — Telephone Encounter (Signed)
Pt is currently admitted will close this encounter.

## 2022-01-04 NOTE — Progress Notes (Addendum)
        Patient vomited twice at the beginning of shift. Her 02 began to drop in high 80s. Pt placed on 2L. Patient weaned off of 02, but began to drop again a little before shift this morning. Placed back on 2L. MD notified.

## 2022-01-04 NOTE — Progress Notes (Signed)
PT Cancellation Note  Patient Details Name: Ann Fletcher MRN: [id number] DOB: March 07, 1933   Cancelled Treatment:    Reason Eval/Treat Not Completed: Other (comment).  Awaiting her meal, will retry at another time.   Ramond Dial 01/04/2022, 1:39 PM  Mee Hives, PT PhD Acute Rehab Dept. Number: [place] and [place]

## 2022-01-05 DIAGNOSIS — J441 Chronic obstructive pulmonary disease with (acute) exacerbation: Secondary | ICD-10-CM | POA: Diagnosis not present

## 2022-01-05 LAB — BASIC METABOLIC PANEL
Anion gap: 12 (ref 5–15)
BUN: 65 mg/dL — ABNORMAL HIGH (ref 8–23)
CO2: 25 mmol/L (ref 22–32)
Calcium: 8.3 mg/dL — ABNORMAL LOW (ref 8.9–10.3)
Chloride: 98 mmol/L (ref 98–111)
Creatinine, Ser: 2.06 mg/dL — ABNORMAL HIGH (ref 0.44–1.00)
GFR, Estimated: 23 mL/min — ABNORMAL LOW (ref >60)
Glucose, Bld: 172 mg/dL — ABNORMAL HIGH (ref 70–99)
Potassium: 4.8 mmol/L (ref 3.5–5.1)
Sodium: 135 mmol/L (ref 135–145)

## 2022-01-05 LAB — GLUCOSE, CAPILLARY
Glucose-Capillary: 165 mg/dL — ABNORMAL HIGH (ref 70–99)
Glucose-Capillary: 213 mg/dL — ABNORMAL HIGH (ref 70–99)
Glucose-Capillary: 221 mg/dL — ABNORMAL HIGH (ref 70–99)
Glucose-Capillary: 227 mg/dL — ABNORMAL HIGH (ref 70–99)
Glucose-Capillary: 186 mg/dL — ABNORMAL HIGH (ref 70–99)

## 2022-01-05 LAB — CBC
HCT: 24.5 % — ABNORMAL LOW (ref 36.0–46.0)
Hemoglobin: 7.8 g/dL — ABNORMAL LOW (ref 12.0–15.0)
MCH: 24.4 pg — ABNORMAL LOW (ref 26.0–34.0)
MCHC: 31.8 g/dL (ref 30.0–36.0)
MCV: 76.6 fL — ABNORMAL LOW (ref 80.0–100.0)
Platelets: 155 10*3/uL (ref 150–400)
RBC: 3.2 MIL/uL — ABNORMAL LOW (ref 3.87–5.11)
RDW: 18.6 % — ABNORMAL HIGH (ref 11.5–15.5)
WBC: 11.5 10*3/uL — ABNORMAL HIGH (ref 4.0–10.5)
nRBC: 0 % (ref 0.0–0.2)

## 2022-01-05 NOTE — Evaluation (Signed)
Clinical/Bedside Swallow Evaluation Patient Details  Name: Ann Fletcher MRN: [id number] Date of Birth: Jul 28, 1932  Today's Date: 01/05/2022 Time: SLP Start Time (ACUTE ONLY): 0903 SLP Stop Time (ACUTE ONLY): 1638 SLP Time Calculation (min) (ACUTE ONLY): 11 min  Past Medical History:  Past Medical History:  Diagnosis Date   Arthritis    hands and back, back spinal stenosis   AV node arrhythmia    a. s/p AV node ablation/PPM 07/2017.   Chronic deafness in right ear    Chronic diastolic CHF (congestive heart failure) ([place])    a. Acute exacerbation occurred in setting of AF.   CKD (chronic kidney disease), stage III ([place])    DM type 2 (diabetes mellitus, type 2) ([place])    Dyspnea    with exertion   Erythropoietin deficiency anemia 08/28/2018   Hyperlipidemia    Hypertension    Hypothyroidism    Lower extremity edema    props up when can swelling then goes down (08-22-2020)   Osteoporosis    Persistent atrial fibrillation ([place])    a. diagnosed in 10/2014 by PCP, underwent failed TEE DCCV on 10/08/14, loaded with amio, successfully DCCV on 10/15/14.   Presence of permanent cardiac pacemaker    Rotator cuff tear since april 2022   Uses walker    Wears dentures    full set   Past Surgical History:  Past Surgical History:  Procedure Laterality Date   AV NODE ABLATION N/A 08/23/2017   Procedure: AV NODE ABLATION;  Surgeon: [name], MD;  Location: [place] [facility];  Service: Cardiovascular;  Laterality: N/A;   BACK SURGERY  2001, 2003, 2006   x 3 lower back has rods and screws in back   BIOPSY  11/06/2017   Procedure: BIOPSY;  Surgeon: [name], MD;  Location: [place];  Service: Endoscopy;;   CARDIOVERSION N/A 10/08/2014   Procedure: CARDIOVERSION;  Surgeon: [name], MD;  Location: [place];  Service: Cardiovascular;  Laterality: N/A;   CARDIOVERSION N/A 10/15/2014   Procedure: CARDIOVERSION;  Surgeon: [name], MD;  Location: [place];  Service:  Cardiovascular;  Laterality: N/A;   CARDIOVERSION N/A 11/15/2014   Procedure: CARDIOVERSION;  Surgeon: [name], MD;  Location: [place];  Service: Cardiovascular;  Laterality: N/A;   COLONOSCOPY N/A 11/06/2017   Procedure: COLONOSCOPY;  Surgeon: [name], MD;  Location: [facility];  Service: Endoscopy;  Laterality: N/A;   ESOPHAGOGASTRODUODENOSCOPY N/A 11/06/2017   Procedure: ESOPHAGOGASTRODUODENOSCOPY (EGD);  Surgeon: [name], MD;  Location: [facility];  Service: Endoscopy;  Laterality: N/A;   GRAFT APPLICATION Bilateral 4/66/5993   Procedure: GRAFT APPLICATION  ACELL;  Surgeon: Edrick Kins, DPM;  Location: [place];  Service: Podiatry;  Laterality: Bilateral;   INTRAMEDULLARY (IM) NAIL INTERTROCHANTERIC Right 02/08/2020   Procedure: INTRAMEDULLARY (IM) NAIL INTERTROCHANTRIC;  Surgeon: Hiram Gash, MD;  Location: [facility];  Service: Orthopedics;  Laterality: Right;   PACEMAKER IMPLANT N/A 08/23/2017   Procedure: PACEMAKER IMPLANT;  Surgeon: [name], MD;  Location: [place] [facility];  Service: Cardiovascular;  Laterality: N/A;   POLYPECTOMY  11/06/2017   Procedure: POLYPECTOMY;  Surgeon: [name], MD;  Location: [place];  Service: Endoscopy;;   REPLACEMENT TOTAL KNEE Right 07/2008   TEE WITHOUT CARDIOVERSION N/A 10/08/2014   Procedure: TRANSESOPHAGEAL ECHOCARDIOGRAM (TEE);  Surgeon: [name], MD;  Location: [place];  Service: Cardiovascular;  Laterality: N/A;   TUBAL LIGATION  yrs ago   WOUND  DEBRIDEMENT Bilateral 08/25/2020   Procedure: DEBRIDEMENT WOUND BILATERAL FEET;  Surgeon: Edrick Kins, DPM;  Location: [place];  Service: Podiatry;  Laterality: Bilateral;   HPI:  Ann Fletcher is a 88 y.o. year old female currently admitted  COPD/ILD Exacerbation to COVID infection. Other PMH; DM, CHF, HTN. A-fib    Assessment / Plan / Recommendation  Clinical Impression  Pt in supine position drinking from straw  and coughing when therapist arrived. Repositioned pt and subsequent sips were consumed without s/sx across continued thin trials and puree and solids (graham cracker mixed in applesauce). Per RN pt has upper and lower denture plate that cannot be located at this time and pt uses them with po consumption. She has a strong volitinal cough. COPD does increase her opportunity for aspiraiton. Therapist will downgrade texture to Dys 2 (minced/ground), continue thin, pills with thin and will plan to follow for safety/efficiency and ability to upgrade po texture. SLP Visit Diagnosis: Dysphagia, unspecified (R13.10)    Aspiration Risk  Mild aspiration risk    Diet Recommendation Dysphagia 2 (Fine chop);Thin liquid   Liquid Administration via: Cup;Straw Medication Administration: Whole meds with liquid Supervision: Patient able to self feed Compensations: Slow rate;Small sips/bites Postural Changes: Seated upright at 90 degrees    Other  Recommendations Oral Care Recommendations: Oral care BID    Recommendations for follow up therapy are one component of a multi-disciplinary discharge planning process, led by the attending physician.  Recommendations may be updated based on patient status, additional functional criteria and insurance authorization.  Follow up Recommendations No SLP follow up      Assistance Recommended at Discharge None  Functional Status Assessment Patient has had a recent decline in their functional status and demonstrates the ability to make significant improvements in function in a reasonable and predictable amount of time.  Frequency and Duration min 2x/week  2 weeks       Prognosis Prognosis for Safe Diet Advancement: Good      Swallow Study   General HPI: Ann Fletcher is a 88 y.o. year old female currently admitted  COPD/ILD Exacerbation to COVID infection. Other PMH; DM, CHF, HTN. A-fib Type of Study: Bedside Swallow Evaluation Previous Swallow Assessment:   (none) Diet Prior to this Study: Thin liquids;Regular Temperature Spikes Noted: No Respiratory Status: Nasal cannula History of Recent Intubation: No Behavior/Cognition: Alert;Cooperative;Pleasant mood Oral Cavity Assessment: Dry Oral Care Completed by SLP: No Oral Cavity - Dentition: Other (Comment) (dentures have been lost this hospitalization) Vision: Functional for self-feeding Self-Feeding Abilities: Able to feed self Patient Positioning: Upright in bed Baseline Vocal Quality: Normal Volitional Cough: Strong Volitional Swallow: Able to elicit    Oral/Motor/Sensory Function Overall Oral Motor/Sensory Function: Within functional limits   Ice Chips Ice chips: Not tested   Thin Liquid Thin Liquid: Within functional limits Presentation: Straw    Nectar Thick Nectar Thick Liquid: Not tested   Honey Thick Honey Thick Liquid: Not tested   Puree Puree: Within functional limits   Solid     Solid: Within functional limits      Houston Siren 01/05/2022,9:29 AM

## 2022-01-05 NOTE — Progress Notes (Signed)
Subjective:   Summary: Ann Fletcher is a 88 y.o. year old female currently admitted on the IMTS HD#3 for COPD/ILD exacerbation secondary to COVID infection.  Overnight Events: No overnight events  Patient was seen this a.m. bedside.  She states she is breathing fine, and has no distress.  Objective:  Vital signs in last 24 hours: Vitals:   01/04/22 2329 01/05/22 0921 01/05/22 0941 01/05/22 0951  BP: (!) 145/63 (!) 145/59    Pulse:  70 70   Resp: 16 18    Temp: 98.1 F (36.7 C) 99.5 F (37.5 C)    TempSrc: Oral Oral    SpO2:  98% 95% 96%  Weight:      Height:       Supplemental O2: Room Air SpO2: 96 % O2 Flow Rate (L/min): 1 L/min   Physical Exam:  Constitutional: well-appearing elderly woman sitting in chair, in no acute distress Cardiovascular: RRR, no murmurs, rubs or gallops Pulmonary/Chest: normal work of breathing on room air, expiratory wheezes heard throughout lung fields Abdominal: soft, non-tender, non-distended Skin: warm and dry Extremities: upper/lower extremity pulses 2+, no lower extremity edema present  [facility] Weights   12/29/21 2308  Weight: 66.4 kg     Intake/Output Summary (Last 24 hours) at 01/05/2022 1254 Last data filed at 01/05/2022 4401 Gross per 24 hour  Intake --  Output 1300 ml  Net -1300 ml   Net IO Since Admission: -5,632 mL [01/05/22 1254]  Pertinent Labs:    Latest Ref Rng & Units 01/05/2022    7:04 AM 01/02/2022    5:53 AM 01/01/2022    6:08 AM  CBC  WBC 4.0 - 10.5 K/uL 11.5  8.0  8.4   Hemoglobin 12.0 - 15.0 g/dL 7.8  9.2  9.5   Hematocrit 36.0 - 46.0 % 24.5  30.5  30.4   Platelets 150 - 400 K/uL 155  115  118        Latest Ref Rng & Units 01/03/2022    8:25 AM 01/02/2022    5:53 AM 01/01/2022    6:08 AM  CMP  Glucose 70 - 99 mg/dL 156  230  196   BUN 8 - 23 mg/dL 59  50  57   Creatinine 0.44 - 1.00 mg/dL 1.58  1.64  1.76   Sodium 135 - 145 mmol/L 138  136  134   Potassium 3.5 - 5.1 mmol/L 4.8   4.7  4.2   Chloride 98 - 111 mmol/L 98  98  97   CO2 22 - 32 mmol/L '28  27  24   '$ Calcium 8.9 - 10.3 mg/dL 9.4  8.8  8.7      Assessment/Plan:   Principal Problem:   COPD with acute exacerbation ([place])   Patient Summary: Ann Fletcher is a 88 y.o. with a pertinent PMH of COPD/ILD, atrial fibrillation status post ablation and permanent pacemaker on Aug 23, 2017, iron deficiency anemia, CKD 4, hypertension and diabetes type 2, who presented with upper respiratory infection symptoms found to be COVID-positive and admitted for COPD/ILD exacerbation (improved)     #COPD/ILD exacerbation secondary to COVID infection Patient continues to endorse adequate breathing.  She denies any shortness of breath, a overnight she was put on 2 L of oxygen to help her sleep, and required the oxygen on exam today.  Continuing to monitor while patient is waiting for SNF  placement on January 08, 2022.  Plan: -Continue LABA/LAMA/ICS - Continue PT/OT  #Aspiration pneumonitis We will continue to monitor patient's respiratory status.  Chest x-ray revealed streaky bilateral basilar opacities concerning for atelectasis versus pneumonia, likely in the setting of the 2 episodes of vomiting patient had 2 nights ago.  If she requires more oxygen, develops leukocytosis, or starts to desaturate, we will treat with antibiotics.  #History of atrial fibrillation status post AV nodal ablation Patient currently not on anticoagulation due to chronic anemia, she is ventricularly placed we will continue to monitor   #Heart failure with preserved ejection fraction Patient will need Lasix only for HFpEF, however due to her CKD therapy has been very limited.  Continue her home dose of Lasix   Code: Full    Dispo: Anticipated discharge in 3 midnights. Drucie Opitz, MD PGY-1 Internal Medicine Resident Pager Number [phone] Please contact the on call pager after 5 pm and on weekends at [phone].

## 2022-01-05 NOTE — Plan of Care (Signed)
  Problem: Respiratory: Goal: Levels of oxygenation will improve Outcome: Progressing   Problem: Activity: Goal: Ability to tolerate increased activity will improve Outcome: Progressing   Problem: Clinical Measurements: Goal: Will remain free from infection Outcome: Progressing   Problem: Pain Managment: Goal: General experience of comfort will improve Outcome: Progressing   Problem: Safety: Goal: Ability to remain free from injury will improve Outcome: Progressing

## 2022-01-05 NOTE — Progress Notes (Signed)
Patient had both top and bottom dentures in room with denture cup. On 10/5 this RN was informed that her dentures were unable to be located. Unable to find in room.

## 2022-01-05 NOTE — TOC Progression Note (Addendum)
Transition of Care [facility]) - Progression Note    Patient Details  Name: LAKETA SANDOZ MRN: [id number] Date of Birth: 08-07-1932  Transition of Care [facility]) CM/SW Contact  Joanne Chars, LCSW Phone Number: 01/05/2022, 2:15 PM  Clinical Narrative:   CSW spoke with [facility].  They would still be interested in [facility].  Otherwise, will accept offer at [facility].  CSW attempted to call [facility], cannot get through to admissions this week, attempted to contact [facility] as well.  TC Bri/Pace.  She asked for update, has also talked to [facility], is aware that [facility] is likely choice, funding will be all set.  Hoping pt and husband can go on same day, CSW provided update that new issues arising and unclear on DC date.  1430: Sheral Flow.  She is not sure if she would have rehab to [place] beds for husband and wife, but it is possible.  She will not know until Monday.  Will talk then.   Expected Discharge Plan:  (TBD) Barriers to Discharge: Continued Medical Work up  Expected Discharge Plan and Services Expected Discharge Plan:  (TBD) In-house Referral: Clinical Social Work   Post Acute Care Choice:  (TBD) Living arrangements for the past 2 months: Single Family Home                                       Social Determinants of Health (SDOH) Interventions    Readmission Risk Interventions    02/09/2020    2:11 PM  Readmission Risk Prevention Plan  Transportation Screening Complete  PCP or Specialist Appt within 5-7 Days Complete  Medication Review (RN CM) Complete

## 2022-01-05 NOTE — Progress Notes (Signed)
Physical Therapy Treatment Patient Details Name: Ann Fletcher MRN: [id number] DOB: 1932/08/25 Today's Date: 01/05/2022   History of Present Illness 88 year old person  who presented 12/29/21 with several days of cough and upper respiratory symptoms, found to be COVID-positive, and admitted for increased work of breathing with ambulation and wheezing.  Pt with episode of vomitting 10/5 and now with aspiration PNE>  PMH significant of ILD w/ possible COPD (never smoker), A-fib status post ablation with permanent pacemaker, iron deficiency anemia, CKD 4, hypertension    PT Comments    Pt requiring mod A for transfers today and O2 dropped to 84% on RA requiring 2 L to recover.  RN reports vomiting/aspiration event yesterday and now with PNE requiring O2.  Pt reports feeling bad and sore today but agreed to PT with encouragement.  She fatigued easily and required rest breaks.  Do continue to recommend SNF.      Recommendations for follow up therapy are one component of a multi-disciplinary discharge planning process, led by the attending physician.  Recommendations may be updated based on patient status, additional functional criteria and insurance authorization.  Follow Up Recommendations  Skilled nursing-short term rehab (<3 hours/day) Can patient physically be transported by private vehicle: Yes   Assistance Recommended at Discharge Frequent or constant Supervision/Assistance  Patient can return home with the following A lot of help with bathing/dressing/bathroom;Assistance with cooking/housework;Help with stairs or ramp for entrance;A little help with walking and/or transfers   Equipment Recommendations  None recommended by PT    Recommendations for Other Services       Precautions / Restrictions Precautions Precautions: Fall     Mobility  Bed Mobility Overal bed mobility: Needs Assistance Bed Mobility: Supine to Sit, Sit to Supine     Supine to sit: Mod assist Sit to supine:  Mod assist   General bed mobility comments: Mod A to lift trunk today and for both legs back to bed; mod A for repositioning; cues for transfer techniques    Transfers Overall transfer level: Needs assistance Equipment used: Rolling walker (2 wheels) Transfers: Sit to/from Stand Sit to Stand: Mod assist           General transfer comment: Performed x 3; with cues for hand placement, use of momentum, and mod A to rise    Ambulation/Gait Ambulation/Gait assistance: Min assist Gait Distance (Feet): 60 Feet Assistive device: Rollator (4 wheels) Gait Pattern/deviations: Step-to pattern, Decreased stride length Gait velocity: reduced,     General Gait Details: Min A to steady; ambulated in room with cues to focus on breathing   Stairs             Wheelchair Mobility    Modified Rankin (Stroke Patients Only)       Balance Overall balance assessment: Needs assistance Sitting-balance support: Feet supported Sitting balance-Leahy Scale: Good     Standing balance support: Bilateral upper extremity supported, During functional activity, Reliant on assistive device for balance Standing balance-Leahy Scale: Poor                              Cognition Arousal/Alertness: Awake/alert Behavior During Therapy: Anxious Overall Cognitive Status: No family/caregiver present to determine baseline cognitive functioning Area of Impairment: Problem solving                             Problem Solving: Slow processing, Requires verbal cues, Requires  tactile cues General Comments: Frequent cues for safety and transfer techniques; perserverating on lost dentures (nursing staff aware and family notified)        Exercises Other Exercises Other Exercises: IS x 10 to 250 mL with cues for slow and deep breaths    General Comments General comments (skin integrity, edema, etc.): Pt had removed O2 (was set to 1 L) at arrival and sats were 90%.  Ambulated and  sats down to 84% and required 2 L to recover to 94% then decreased to 1 L with sats 92%.  RN reports requiring O2 since vomitting/aspiration event last night.      Pertinent Vitals/Pain Pain Assessment Pain Assessment: Faces Faces Pain Scale: Hurts a little bit Pain Location: buttock Pain Descriptors / Indicators: Sore Pain Intervention(s): Limited activity within patient's tolerance, Monitored during session (reports buttock sore with coughing; checked pt when standing - no redness or wounds)    Home Living                          Prior Function            PT Goals (current goals can now be found in the care plan section) Progress towards PT goals: Not progressing toward goals - comment (increased assist and O2 need today)    Frequency    Min 2X/week (for SNF placement)      PT Plan Frequency needs to be updated    Co-evaluation              AM-PAC PT "6 Clicks" Mobility   Outcome Measure  Help needed turning from your back to your side while in a flat bed without using bedrails?: A Little Help needed moving from lying on your back to sitting on the side of a flat bed without using bedrails?: A Lot Help needed moving to and from a bed to a chair (including a wheelchair)?: A Lot Help needed standing up from a chair using your arms (e.g., wheelchair or bedside chair)?: A Lot Help needed to walk in hospital room?: A Little Help needed climbing 3-5 steps with a railing? : A Lot 6 Click Score: 14    End of Session Equipment Utilized During Treatment: Gait belt Activity Tolerance: Patient tolerated treatment well Patient left: with call bell/phone within reach;in bed;with bed alarm set Nurse Communication: Mobility status PT Visit Diagnosis: Muscle weakness (generalized) (M62.81);Difficulty in walking, not elsewhere classified (R26.2)     Time: 2951-8841 PT Time Calculation (min) (ACUTE ONLY): 28 min  Charges:  $Gait Training: 8-22  mins $Therapeutic Activity: 8-22 mins                     Abran Richard, PT Acute Rehab [facility] [place] 01/05/2022, 3:30 PM

## 2022-01-05 NOTE — Progress Notes (Signed)
Mobility Specialist Progress Note   01/05/22 1716  Mobility  Activity Ambulated with assistance in room  Activity Response Tolerated fair  Distance Ambulated (ft) 18 ft  $Mobility charge 1 Mobility  Level of Assistance Minimal assist, patient does 75% or more  Assistive Device Four wheel walker   Pre Mobility: 97% SpO2 1LO2 During Mobility: 86% SpO2 1LO2 Post Mobility: 92% SpO2 1LO2  Received pt in bed slightly confused, oriented x2. First agreeable to mobility but every initiation of transitioning pt would claim they didn't want to do it. Pt eventually encouraged to EOB, required minA d/t trunk and LE weakness. There was no physical assistance needed for STS but pt fatigued very quickly w/ ambulation in room, x1 seated rest break. Pt showing signs of DOE while SpO2 reading 86% on 1LO2, pt able to recover w/ pursed lip breathing for ~ 5 mins.  Returned back to bed w/o fault but requiring moderate directional cues while using 4WW. Left in bed w/ call bell in reach and all needs met.   Holland Falling Mobility Specialist [facility] #:  [phone] Acute Rehab Office:  [phone]

## 2022-01-06 LAB — BASIC METABOLIC PANEL
Anion gap: 9 (ref 5–15)
BUN: 60 mg/dL — ABNORMAL HIGH (ref 8–23)
CO2: 30 mmol/L (ref 22–32)
Calcium: 8.4 mg/dL — ABNORMAL LOW (ref 8.9–10.3)
Chloride: 96 mmol/L — ABNORMAL LOW (ref 98–111)
Creatinine, Ser: 2.02 mg/dL — ABNORMAL HIGH (ref 0.44–1.00)
GFR, Estimated: 23 mL/min — ABNORMAL LOW (ref >60)
Glucose, Bld: 132 mg/dL — ABNORMAL HIGH (ref 70–99)
Potassium: 4.3 mmol/L (ref 3.5–5.1)
Sodium: 135 mmol/L (ref 135–145)

## 2022-01-06 LAB — GLUCOSE, CAPILLARY
Glucose-Capillary: 126 mg/dL — ABNORMAL HIGH (ref 70–99)
Glucose-Capillary: 258 mg/dL — ABNORMAL HIGH (ref 70–99)
Glucose-Capillary: 228 mg/dL — ABNORMAL HIGH (ref 70–99)
Glucose-Capillary: 302 mg/dL — ABNORMAL HIGH (ref 70–99)

## 2022-01-06 LAB — CBC
HCT: 23.2 % — ABNORMAL LOW (ref 36.0–46.0)
Hemoglobin: 7.3 g/dL — ABNORMAL LOW (ref 12.0–15.0)
MCH: 24.3 pg — ABNORMAL LOW (ref 26.0–34.0)
MCHC: 31.5 g/dL (ref 30.0–36.0)
MCV: 77.1 fL — ABNORMAL LOW (ref 80.0–100.0)
Platelets: 156 10*3/uL (ref 150–400)
RBC: 3.01 MIL/uL — ABNORMAL LOW (ref 3.87–5.11)
RDW: 18.5 % — ABNORMAL HIGH (ref 11.5–15.5)
WBC: 8.8 10*3/uL (ref 4.0–10.5)
nRBC: 0 % (ref 0.0–0.2)

## 2022-01-06 MED ORDER — INSULIN ASPART 100 UNIT/ML IJ SOLN
5.0000 [IU] | Freq: Once | INTRAMUSCULAR | Status: AC
  Administered 2022-01-07: 5 [IU] via SUBCUTANEOUS

## 2022-01-06 MED ORDER — SODIUM CHLORIDE 0.9 % IV SOLN
250.0000 mg | Freq: Every day | INTRAVENOUS | Status: DC
  Administered 2022-01-07 – 2022-01-08 (×2): 250 mg via INTRAVENOUS
  Filled 2022-01-06 (×3): qty 20

## 2022-01-06 NOTE — Plan of Care (Signed)
  Problem: Clinical Measurements: Goal: Will remain free from infection Outcome: Progressing   Problem: Safety: Goal: Ability to remain free from injury will improve Outcome: Progressing   

## 2022-01-06 NOTE — Progress Notes (Signed)
Subjective:   Summary: Ann Fletcher is a 88 y.o. year old female currently admitted on the IMTS HD#4 for COPD/ILD exacerbation secondary to COVID infection.  Overnight Events: No overnight events  Patient was seen this a.m. bedside.  No difficulty breathing. She has not had any further episodes of emesis either.   Objective:  Vital signs in last 24 hours: Vitals:   01/05/22 2004 01/05/22 2114 01/06/22 0833 01/06/22 0907  BP: (!) 125/52  (!) 148/60   Pulse: 74     Resp: 18  18   Temp: 98 F (36.7 C)  (!) 97.5 F (36.4 C)   TempSrc: Oral  Oral   SpO2: 99% 99%  98%  Weight:      Height:       Supplemental O2: Room Air SpO2: 98 % O2 Flow Rate (L/min): 1 L/min   Physical Exam:  Constitutional: Well-developed, well-nourished, and in no distress.  Cardiovascular: Normal rate, regular rhythm, intact distal pulses. No gallop and no friction rub.  No murmur heard. No lower extremity edema  Pulmonary: Non labored breathing on room air, no rales, Diffuse wheezing throughout lung fields.  Abdominal: Soft. Normal bowel sounds. Non distended and non tender Musculoskeletal: Normal range of motion.     Neurological: Alert and oriented to person, place, and time. Non focal  Skin: Skin is warm and dry.   Filed Weights   12/29/21 2308  Weight: 66.4 kg     Intake/Output Summary (Last 24 hours) at 01/06/2022 1320 Last data filed at 01/06/2022 0659 Gross per 24 hour  Intake --  Output 600 ml  Net -600 ml    Net IO Since Admission: -6,232 mL [01/06/22 1320]  Pertinent Labs:    Latest Ref Rng & Units 01/06/2022    2:19 AM 01/05/2022    7:04 AM 01/02/2022    5:53 AM  CBC  WBC 4.0 - 10.5 K/uL 8.8  11.5  8.0   Hemoglobin 12.0 - 15.0 g/dL 7.3  7.8  9.2   Hematocrit 36.0 - 46.0 % 23.2  24.5  30.5   Platelets 150 - 400 K/uL 156  155  115        Latest Ref Rng & Units 01/06/2022    2:19 AM 01/05/2022    7:04 AM 01/03/2022    8:25 AM  CMP  Glucose 70 - 99 mg/dL  132  172  156   BUN 8 - 23 mg/dL 60  65  59   Creatinine 0.44 - 1.00 mg/dL 2.02  2.06  1.58   Sodium 135 - 145 mmol/L 135  135  138   Potassium 3.5 - 5.1 mmol/L 4.3  4.8  4.8   Chloride 98 - 111 mmol/L 96  98  98   CO2 22 - 32 mmol/L '30  25  28   '$ Calcium 8.9 - 10.3 mg/dL 8.4  8.3  9.4      Assessment/Plan:   Principal Problem:   COPD with acute exacerbation ([place])   Patient Summary: Ann Fletcher is a 88 y.o. with a pertinent PMH of COPD/ILD, atrial fibrillation status post ablation and permanent pacemaker on Aug 23, 2017, iron deficiency anemia, CKD 4, hypertension and diabetes type 2, who presented with upper respiratory infection symptoms found to be COVID-positive and admitted for COPD/ILD exacerbation (improved)     #COPD/ILD exacerbation secondary to COVID infection Patient continues to endorse  adequate breathing.  She denies any shortness of breath,not requiring any O2.  Continuing to monitor while patient is waiting for SNF placement on January 08, 2022. Tomorrow will be day 10 from when she tested positive for COVID  Plan: -Continue LABA/LAMA/ICS and PRN duonebs.  - Continue PT/OT  #Aspiration pneumonitis We will continue to monitor patient's respiratory status.  No leukocytosis and her O2 sats remain stable.   #History of atrial fibrillation status post AV nodal ablation Patient currently not on anticoagulation due to chronic anemia, she is ventricularly placed we will continue to monitor  #Heart failure with preserved ejection fraction Patient will need Lasix only for HFpEF, however due to her CKD therapy has been very limited.  Continue her home dose of Lasix  #IDA (microcytic) Patient's hemoglobin has been downtrending during her admission. She has no obvious signs of bleeding. Will give patient lab holiday tomrrow unless she is discharging. In addition, will give patient more iv iron. She has been worked up for this in the outpatient setting.  -Will continue to  monitor her blood counts And for signs/symptoms of acute bleeding   Code: Full    Rick Duff, MD PGY-3 Internal Medicine  Pager [phone]

## 2022-01-06 NOTE — Progress Notes (Addendum)
Pt without IV medications, Will hold on placement until seen by doctor today.  Pt stable at this time.  Crystal RN aware.

## 2022-01-07 DIAGNOSIS — J690 Pneumonitis due to inhalation of food and vomit: Secondary | ICD-10-CM

## 2022-01-07 DIAGNOSIS — U071 COVID-19: Principal | ICD-10-CM

## 2022-01-07 LAB — CBC
HCT: 22.8 % — ABNORMAL LOW (ref 36.0–46.0)
Hemoglobin: 7.2 g/dL — ABNORMAL LOW (ref 12.0–15.0)
MCH: 24.1 pg — ABNORMAL LOW (ref 26.0–34.0)
MCHC: 31.6 g/dL (ref 30.0–36.0)
MCV: 76.3 fL — ABNORMAL LOW (ref 80.0–100.0)
Platelets: 181 10*3/uL (ref 150–400)
RBC: 2.99 MIL/uL — ABNORMAL LOW (ref 3.87–5.11)
RDW: 18.6 % — ABNORMAL HIGH (ref 11.5–15.5)
WBC: 9.5 10*3/uL (ref 4.0–10.5)
nRBC: 0 % (ref 0.0–0.2)

## 2022-01-07 LAB — BASIC METABOLIC PANEL
Anion gap: 8 (ref 5–15)
BUN: 62 mg/dL — ABNORMAL HIGH (ref 8–23)
CO2: 29 mmol/L (ref 22–32)
Calcium: 8.1 mg/dL — ABNORMAL LOW (ref 8.9–10.3)
Chloride: 96 mmol/L — ABNORMAL LOW (ref 98–111)
Creatinine, Ser: 0.66 mg/dL (ref 0.44–1.00)
GFR, Estimated: >60 mL/min (ref >60)
Glucose, Bld: 159 mg/dL — ABNORMAL HIGH (ref 70–99)
Potassium: 4.4 mmol/L (ref 3.5–5.1)
Sodium: 133 mmol/L — ABNORMAL LOW (ref 135–145)

## 2022-01-07 LAB — GLUCOSE, CAPILLARY
Glucose-Capillary: 142 mg/dL — ABNORMAL HIGH (ref 70–99)
Glucose-Capillary: 262 mg/dL — ABNORMAL HIGH (ref 70–99)
Glucose-Capillary: 149 mg/dL — ABNORMAL HIGH (ref 70–99)
Glucose-Capillary: 123 mg/dL — ABNORMAL HIGH (ref 70–99)

## 2022-01-07 MED ORDER — AQUAPHOR EX OINT
TOPICAL_OINTMENT | Freq: Every day | CUTANEOUS | Status: DC
  Filled 2022-01-07: qty 50

## 2022-01-07 MED ORDER — INSULIN ASPART 100 UNIT/ML IJ SOLN: 0.0000 [IU] | Freq: Three times a day (TID) | INTRAMUSCULAR | Status: DC

## 2022-01-07 MED ORDER — POLYETHYLENE GLYCOL 3350 17 G PO PACK
17.0000 g | PACK | Freq: Every day | ORAL | Status: DC
  Administered 2022-01-07 – 2022-01-08 (×2): 17 g via ORAL
  Filled 2022-01-07 (×2): qty 1

## 2022-01-07 MED ORDER — INSULIN ASPART 100 UNIT/ML IJ SOLN
0.0000 [IU] | Freq: Three times a day (TID) | INTRAMUSCULAR | Status: DC
  Administered 2022-01-07: 5 [IU] via SUBCUTANEOUS
  Administered 2022-01-07: 1 [IU] via SUBCUTANEOUS
  Administered 2022-01-08: 3 [IU] via SUBCUTANEOUS
  Administered 2022-01-08 (×2): 2 [IU] via SUBCUTANEOUS

## 2022-01-07 MED ORDER — INSULIN ASPART 100 UNIT/ML IJ SOLN: 0.0000 [IU] | Freq: Every day | INTRAMUSCULAR | Status: DC

## 2022-01-07 NOTE — TOC Progression Note (Addendum)
Transition of Care [facility]) - Progression Note    Patient Details  Name: Ann Fletcher MRN: [id number] Date of Birth: 1932/06/17  Transition of Care [facility]) CM/SW Contact  Tremar Wickens, [place], [place] Phone Number: 01/07/2022, 12:39 PM  Clinical Narrative:     Phone call to [place], admissions director at [facility] to follow up on status of placement . VM left for a return call.  Vestal Markin, LCSW Clinical Social Worker  Transitions of Care   Expected Discharge Plan:  (TBD) Barriers to Discharge: Continued Medical Work up  Expected Discharge Plan and Services Expected Discharge Plan:  (TBD) In-house Referral: Clinical Social Work   Post Acute Care Choice:  (TBD) Living arrangements for the past 2 months: Single Family Home                                       Social Determinants of Health (SDOH) Interventions    Readmission Risk Interventions    02/09/2020    2:11 PM  Readmission Risk Prevention Plan  Transportation Screening Complete  PCP or Specialist Appt within 5-7 Days Complete  Medication Review (RN CM) Complete

## 2022-01-07 NOTE — Progress Notes (Signed)
Subjective:   Summary: Ann Fletcher is a 88 y.o. year old female currently admitted on the IMTS HD#5 for COPD/ILD exacerbation secondary to COVID infection.  Overnight Events: No overnight events  Patient has no difficulty breathing. She has been able to tolerate eating without any difficulty or further episodes of emesis.   Objective:  Vital signs in last 24 hours: Vitals:   01/06/22 0833 01/06/22 0907 01/06/22 2000 01/07/22 0802  BP: (!) 148/60  (!) 139/57   Pulse:   70 72  Resp: '18  15 20  '$ Temp: (!) 97.5 F (36.4 C)  98 F (36.7 C)   TempSrc: Oral  Oral   SpO2:  98% 95% 96%  Weight:      Height:       Supplemental O2: Room Air SpO2: 96 % O2 Flow Rate (L/min): 1 L/min FiO2 (%): 24 %   Physical Exam:  Constitutional: Well-developed, well-nourished, and in no distress.   Cardiovascular: Normal rate, regular rhythm, intact distal pulses. No gallop and no friction rub.  No murmur heard. No lower extremity edema  Pulmonary: Non labored breathing on [place], no rales, mild diffuse wheezing, no wheezing or rales Musculoskeletal: Normal range of motion.        General: No tenderness or edema.  Neurological: Alert and oriented to person, place, and time. Non focal  Skin: Skin is warm and dry.    Filed Weights   12/29/21 2308  Weight: 66.4 kg     Intake/Output Summary (Last 24 hours) at 01/07/2022 1143 Last data filed at 01/07/2022 0900 Gross per 24 hour  Intake 360 ml  Output --  Net 360 ml    Net IO Since Admission: -5,632 mL [01/07/22 1143]  Pertinent Labs:    Latest Ref Rng & Units 01/07/2022    3:33 AM 01/06/2022    2:19 AM 01/05/2022    7:04 AM  CBC  WBC 4.0 - 10.5 K/uL 9.5  8.8  11.5   Hemoglobin 12.0 - 15.0 g/dL 7.2  7.3  7.8   Hematocrit 36.0 - 46.0 % 22.8  23.2  24.5   Platelets 150 - 400 K/uL 181  156  155        Latest Ref Rng & Units 01/07/2022    3:33 AM 01/06/2022    2:19 AM 01/05/2022    7:04 AM  CMP  Glucose 70 - 99 mg/dL  159  132  172   BUN 8 - 23 mg/dL 62  60  65   Creatinine 0.44 - 1.00 mg/dL 0.66  2.02  2.06   Sodium 135 - 145 mmol/L 133  135  135   Potassium 3.5 - 5.1 mmol/L 4.4  4.3  4.8   Chloride 98 - 111 mmol/L 96  96  98   CO2 22 - 32 mmol/L '29  30  25   '$ Calcium 8.9 - 10.3 mg/dL 8.1  8.4  8.3      Assessment/Plan:   Principal Problem:   COPD with acute exacerbation ([place]) Active Problems:   COVID-19   Patient Summary: Ann Fletcher is a 88 y.o. with a pertinent PMH of COPD/ILD, atrial fibrillation status post ablation and permanent pacemaker on Aug 23, 2017, iron deficiency anemia, CKD 4, hypertension and diabetes type 2, who presented with upper respiratory infection symptoms found to be COVID-positive and admitted for COPD/ILD exacerbation (improved)     #  COPD/ILD exacerbation secondary to COVID infection Patient continues to have non labored breathing on RA to 1L [place].  She denies any shortness of breath this AM.  Continuing to monitor while patient is waiting for SNF placement. She is 10 days out from when she tested positive for COVID.  Plan: -Continue LABA/LAMA/ICS and PRN duonebs.  - Continue PT/OT  #Aspiration pneumonitis No leukocytosis and afebrile. Her O2 sats remain stable.   #History of atrial fibrillation status post AV nodal ablation Patient currently not on anticoagulation due to chronic anemia, she is ventricularly placed we will continue to monitor  #Heart failure with preserved ejection fraction Will continue her home dose of Lasix.  #IDA (microcytic) Patient's hemoglobin has been downtrending during her admission. She has no obvious signs of bleeding. Will give patient lab holiday tomrrow unless she is discharging. Will continue IV iron and transfuse for Hgb <7.  -Will continue to monitor her blood counts And for signs/symptoms of acute bleeding   Code: Full  Rick Duff, MD PGY-3 Internal Medicine  Pager [phone]

## 2022-01-07 NOTE — TOC Progression Note (Signed)
Transition of Care [facility]) - Progression Note    Patient Details  Name: Ann Fletcher MRN: [id number] Date of Birth: [birth year]  Transition of Care [facility]) CM/SW Contact  [address] [place], [place] Phone Number: 01/07/2022, 1:43 PM  Clinical Narrative:     Phone call to [facility] -Admissions Director for [place]. The plan is to discharge patient with her spouse tomorrow, 01/08/22.  Crystal is also waiting on billing verification from the [facility] as well as clearance from [place].   Laporchia Nakajima, LCSW Clinical Social Worker  Transitions of Care [phone]  Expected Discharge Plan:  (TBD) Barriers to Discharge: Continued Medical Work up  Expected Discharge Plan and Services Expected Discharge Plan:  (TBD) In-house Referral: Clinical Social Work   Post Acute Care Choice:  (TBD) Living arrangements for the past 2 months: Single Family Home                                       Social Determinants of Health (SDOH) Interventions    Readmission Risk Interventions    02/09/2020    2:11 PM  Readmission Risk Prevention Plan  Transportation Screening Complete  PCP or Specialist Appt within 5-7 Days Complete  Medication Review (RN CM) Complete

## 2022-01-08 DIAGNOSIS — J449 Chronic obstructive pulmonary disease, unspecified: Secondary | ICD-10-CM

## 2022-01-08 LAB — BASIC METABOLIC PANEL
Anion gap: 10 (ref 5–15)
BUN: 55 mg/dL — ABNORMAL HIGH (ref 8–23)
CO2: 28 mmol/L (ref 22–32)
Calcium: 8.5 mg/dL — ABNORMAL LOW (ref 8.9–10.3)
Chloride: 96 mmol/L — ABNORMAL LOW (ref 98–111)
Creatinine, Ser: 1.83 mg/dL — ABNORMAL HIGH (ref 0.44–1.00)
GFR, Estimated: 26 mL/min — ABNORMAL LOW (ref >60)
Glucose, Bld: 85 mg/dL (ref 70–99)
Potassium: 4.1 mmol/L (ref 3.5–5.1)
Sodium: 134 mmol/L — ABNORMAL LOW (ref 135–145)

## 2022-01-08 LAB — GLUCOSE, CAPILLARY
Glucose-Capillary: 175 mg/dL — ABNORMAL HIGH (ref 70–99)
Glucose-Capillary: 208 mg/dL — ABNORMAL HIGH (ref 70–99)
Glucose-Capillary: 166 mg/dL — ABNORMAL HIGH (ref 70–99)

## 2022-01-08 MED ORDER — UMECLIDINIUM-VILANTEROL 62.5-25 MCG/ACT IN AEPB: 1.0000 | INHALATION_SPRAY | Freq: Every day | RESPIRATORY_TRACT | Status: AC

## 2022-01-08 NOTE — Progress Notes (Signed)
Occupational Therapy Treatment Patient Details Name: DALLAS SCORSONE MRN: [id number] DOB: [birth year] Today's Date: 01/08/2022   History of present illness 88 year old person  who presented 12/29/21 with several days of cough and upper respiratory symptoms, found to be COVID-positive, and admitted for increased work of breathing with ambulation and wheezing.  Pt with episode of vomitting 10/5 and now with aspiration PNE>  PMH significant of ILD w/ possible COPD (never smoker), A-fib status post ablation with permanent pacemaker, iron deficiency anemia, CKD 4, hypertension   OT comments  Pt progressing towards established OT goals. Continues to present with decreased activity tolerance as seen by fatigue and SpO2 dropping; requiring at least 1L O2. Pt performing functional mobility into bathroom with Min Guard and RW. Pt performing grooming at sink with Min Guard A for safety and then seated rest break. Assisting pt in calling her husband. Continue to recommend dc to SNF and will continue to follow acutely as admitted.    Recommendations for follow up therapy are one component of a multi-disciplinary discharge planning process, led by the attending physician.  Recommendations may be updated based on patient status, additional functional criteria and insurance authorization.    Follow Up Recommendations  Skilled nursing-short term rehab (<3 hours/day)    Assistance Recommended at Discharge Frequent or constant Supervision/Assistance  Patient can return home with the following  A little help with walking and/or transfers;A little help with bathing/dressing/bathroom   Equipment Recommendations  BSC/3in1    Recommendations for Other Services      Precautions / Restrictions Precautions Precautions: Fall Precaution Comments: denies falls Restrictions Weight Bearing Restrictions: No       Mobility Bed Mobility               General bed mobility comments: In recliner upon arrival     Transfers Overall transfer level: Needs assistance Equipment used: Rolling walker (2 wheels) Transfers: Sit to/from Stand Sit to Stand: Min assist           General transfer comment: Min A to power up     Balance Overall balance assessment: Needs assistance Sitting-balance support: No upper extremity supported, Feet supported Sitting balance-Leahy Scale: Good     Standing balance support: No upper extremity supported, During functional activity, Single extremity supported Standing balance-Leahy Scale: Poor Standing balance comment: reliant on atleast one UE                           ADL either performed or assessed with clinical judgement   ADL Overall ADL's : Needs assistance/impaired     Grooming: Brushing hair;Min guard;Standing Grooming Details (indicate cue type and reason): Pt brushing her hair while standing at sink. Requiring seated rest break right after due to fatigue. Pt then standing to apply lipstick                 Toilet Transfer: Min guard;Ambulation;Rolling walker (2 wheels) (simulated to recliner)           Functional mobility during ADLs: Min guard;Rolling walker (2 wheels) General ADL Comments: Pt demonstrating decreased activity tolerance as seen by fatigue and decreased SpO2    Extremity/Trunk Assessment Upper Extremity Assessment Upper Extremity Assessment: Generalized weakness   Lower Extremity Assessment Lower Extremity Assessment: Defer to PT evaluation        Vision       Perception     Praxis      Cognition Arousal/Alertness: Awake/alert Behavior During Therapy: [facility]  for tasks assessed/performed Overall Cognitive Status: No family/caregiver present to determine baseline cognitive functioning Area of Impairment: Problem solving                             Problem Solving: Slow processing, Requires verbal cues, Requires tactile cues General Comments: Perseverating on talking about calling her  husband yesterday; however following commands and very eager to participate        Exercises      Shoulder Instructions       General Comments SPO2 >88% on RA; dropping to 86% with activity at sink. Placed back on 1L O2 and pt taking seated rest break; elevating to 94% on 1L    Pertinent Vitals/ Pain       Pain Assessment Pain Assessment: Faces Faces Pain Scale: Hurts a little bit Pain Location: Generalized Pain Descriptors / Indicators: Sore Pain Intervention(s): Monitored during session, Limited activity within patient's tolerance, Repositioned  Home Living                                          Prior Functioning/Environment              Frequency  Min 2X/week        Progress Toward Goals  OT Goals(current goals can now be found in the care plan section)  Progress towards OT goals: Progressing toward goals  Acute Rehab OT Goals OT Goal Formulation: With patient Time For Goal Achievement: 01/15/22 Potential to Achieve Goals: Good ADL Goals Pt Will Perform Grooming: with modified independence;standing Pt Will Perform Upper Body Dressing: with modified independence;sitting Pt Will Perform Lower Body Dressing: with modified independence;sit to/from stand Pt Will Transfer to Toilet: with modified independence;ambulating;bedside commode Pt Will Perform Toileting - Clothing Manipulation and hygiene: with modified independence;sitting/lateral leans;sit to/from stand Additional ADL Goal #1: Pt will perform bed mobility with Supervision in preparation for ADLs  Plan Discharge plan remains appropriate    Co-evaluation                 AM-PAC OT "6 Clicks" Daily Activity     Outcome Measure   Help from another person eating meals?: None Help from another person taking care of personal grooming?: A Little Help from another person toileting, which includes using toliet, bedpan, or urinal?: A Little Help from another person bathing  (including washing, rinsing, drying)?: A Little Help from another person to put on and taking off regular upper body clothing?: A Little Help from another person to put on and taking off regular lower body clothing?: A Little 6 Click Score: 19    End of Session Equipment Utilized During Treatment: Rolling walker (2 wheels)  OT Visit Diagnosis: Unsteadiness on feet (R26.81);Other abnormalities of gait and mobility (R26.89);Muscle weakness (generalized) (M62.81)   Activity Tolerance Patient tolerated treatment well   Patient Left in chair;with call bell/phone within reach;with chair alarm set   Nurse Communication Mobility status        Time: 8185-6314 OT Time Calculation (min): 36 min  Charges: OT General Charges $OT Visit: 1 Visit OT Treatments $Self Care/Home Management : 23-37 mins  Dolan Xia MSOT, OTR/L Acute Rehab Office: [place] 01/08/2022, 2:33 PM

## 2022-01-08 NOTE — Plan of Care (Signed)
  Problem: Education: Goal: Knowledge of disease or condition will improve Outcome: Progressing Goal: Knowledge of the prescribed therapeutic regimen will improve Outcome: Progressing Goal: Individualized Educational Video(s) Outcome: Progressing   Problem: Activity: Goal: Ability to tolerate increased activity will improve Outcome: Progressing Goal: Will verbalize the importance of balancing activity with adequate rest periods Outcome: Progressing   Problem: Respiratory: Goal: Ability to maintain a clear airway will improve Outcome: Progressing Goal: Levels of oxygenation will improve Outcome: Progressing Goal: Ability to maintain adequate ventilation will improve Outcome: Progressing   Problem: Education: Goal: Knowledge of disease or condition will improve Outcome: Progressing Goal: Knowledge of the prescribed therapeutic regimen will improve Outcome: Progressing Goal: Individualized Educational Video(s) Outcome: Progressing   Problem: Activity: Goal: Ability to tolerate increased activity will improve Outcome: Progressing Goal: Will verbalize the importance of balancing activity with adequate rest periods Outcome: Progressing   Problem: Respiratory: Goal: Ability to maintain a clear airway will improve Outcome: Progressing Goal: Levels of oxygenation will improve Outcome: Progressing Goal: Ability to maintain adequate ventilation will improve Outcome: Progressing   Problem: Education: Goal: Ability to describe self-care measures that may prevent or decrease complications (Diabetes Survival Skills Education) will improve Outcome: Progressing Goal: Individualized Educational Video(s) Outcome: Progressing   Problem: Coping: Goal: Ability to adjust to condition or change in health will improve Outcome: Progressing   Problem: Fluid Volume: Goal: Ability to maintain a balanced intake and output will improve Outcome: Progressing   Problem: Health  Behavior/Discharge Planning: Goal: Ability to identify and utilize available resources and services will improve Outcome: Progressing Goal: Ability to manage health-related needs will improve Outcome: Progressing   Problem: Metabolic: Goal: Ability to maintain appropriate glucose levels will improve Outcome: Progressing   Problem: Nutritional: Goal: Maintenance of adequate nutrition will improve Outcome: Progressing Goal: Progress toward achieving an optimal weight will improve Outcome: Progressing   Problem: Skin Integrity: Goal: Risk for impaired skin integrity will decrease Outcome: Progressing   Problem: Tissue Perfusion: Goal: Adequacy of tissue perfusion will improve Outcome: Progressing   Problem: Education: Goal: Knowledge of General Education information will improve Description: Including pain rating scale, medication(s)/side effects and non-pharmacologic comfort measures Outcome: Progressing   Problem: Health Behavior/Discharge Planning: Goal: Ability to manage health-related needs will improve Outcome: Progressing   Problem: Clinical Measurements: Goal: Ability to maintain clinical measurements within normal limits will improve Outcome: Progressing Goal: Will remain free from infection Outcome: Progressing Goal: Diagnostic test results will improve Outcome: Progressing Goal: Respiratory complications will improve Outcome: Progressing Goal: Cardiovascular complication will be avoided Outcome: Progressing   Problem: Nutrition: Goal: Adequate nutrition will be maintained Outcome: Progressing   Problem: Coping: Goal: Level of anxiety will decrease Outcome: Progressing   Problem: Elimination: Goal: Will not experience complications related to bowel motility Outcome: Progressing Goal: Will not experience complications related to urinary retention Outcome: Progressing   Problem: Pain Managment: Goal: General experience of comfort will improve Outcome:  Progressing   Problem: Safety: Goal: Ability to remain free from injury will improve Outcome: Progressing

## 2022-01-08 NOTE — TOC Transition Note (Signed)
Transition of Care [facility]) - CM/SW Discharge Note   Patient Details  Name: Ann Fletcher MRN: [id number] Date of Birth: Nov 15, 1932  [facility]) CM/SW Contact:  Joanne Chars, LCSW Phone Number: 01/08/2022, 2:50 PM   Clinical Narrative:   Pt discharging to [facility], room 211.  RN call report to [phone].  [facility] will provide transportation--waiting to here when they will be here.  Final next level of care: [place] Barriers to Discharge: Barriers Resolved   Patient Goals and CMS Choice        Discharge Placement              Patient chooses bed at:  [facility]) Patient to be transferred to facility by: [facility] transport. Name of family member notified: daughter in law Joy Patient and family notified of of transfer: 01/08/22  Discharge Plan and Services In-house Referral: Clinical Social Work   Post Acute Care Choice:  (TBD)                               Social Determinants of Health (SDOH) Interventions     Readmission Risk Interventions    02/09/2020    2:11 PM  Readmission Risk Prevention Plan  Transportation Screening Complete  PCP or Specialist Appt within 5-7 Days Complete  Medication Review (RN CM) Complete

## 2022-01-08 NOTE — Progress Notes (Signed)
SATURATION QUALIFICATIONS: (This note is used to comply with regulatory documentation for home oxygen)  Patient Saturations on Room Air at Rest = 94%  Patient Saturations on Room Air while Ambulating = 87%  Patient Saturations on 1 Liters of oxygen while Ambulating = 92%  Please briefly explain why patient needs home oxygen: N/A   Holland Falling Mobility Specialist MS [facility] #:  [phone] Acute Rehab Office:  [phone]

## 2022-01-08 NOTE — Plan of Care (Signed)
  Problem: Pain Managment: Goal: General experience of comfort will improve Outcome: Progressing   Problem: Safety: Goal: Ability to remain free from injury will improve Outcome: Progressing   Problem: Skin Integrity: Goal: Risk for impaired skin integrity will decrease Outcome: Progressing   

## 2022-01-08 NOTE — TOC Progression Note (Signed)
Transition of Care [facility]) - Progression Note    Patient Details  Name: Ann Fletcher MRN: [id number] Date of Birth: 1932-08-17  [facility] CM/SW Contact  Joanne Chars, LCSW Phone Number: 01/08/2022, 1:41 PM  Clinical Narrative:   TC Krital/[place].  Needs info from [facility] prior to accepting pt.  TC Bri/[facility].  She is not ready yet with payment information for [place], working on it.  TC Joy, DIL.  Updated her that pt likely to DC today.  Per Caren Griffins, TOC, pt husband not ready to DC until Wed.  1330: Message from [employer].  Still waiting on [facility].    Expected Discharge Plan:  (TBD) Barriers to Discharge: Continued Medical Work up  Expected Discharge Plan and Services Expected Discharge Plan:  (TBD) In-house Referral: Clinical Social Work   Post Acute Care Choice:  (TBD) Living arrangements for the past 2 months: Single Family Home Expected Discharge Date: 01/08/22                                     Social Determinants of Health (SDOH) Interventions    Readmission Risk Interventions    02/09/2020    2:11 PM  Readmission Risk Prevention Plan  Transportation Screening Complete  PCP or Specialist Appt within 5-7 Days Complete  Medication Review (RN CM) Complete

## 2022-01-08 NOTE — Progress Notes (Signed)
Mobility Specialist Progress Note   01/08/22 1057  Mobility  Activity Ambulated with assistance in room  Activity Response Tolerated well  Distance Ambulated (ft) 46 ft  $Mobility charge 1 Mobility  Level of Assistance Minimal assist, patient does 75% or more  Assistive Device Four wheel walker   Pre Mobility: 97% SpO2 on 1LO2 During Mobility: 87% SpO2 on RA Post Mobility: 93% SpO2 1LO2  Received pt in bed having slight c/o heel pain but agreeable to mobility. MinA to sit trunk up on EOB + to stand, contact guard for remainder of ambulation. Ambulated on RA (per advisement of RN) and floating around 87% SpO2 w/ pt sounding dyspneic. Placed back on 1LO2, pt maintaining >91% while mobilizing. Left in chair w/ call bell in reach and chair alarm on.   Holland Falling Mobility Specialist [place] #:  [phone] Acute Rehab Office:  [phone]

## 2022-01-08 NOTE — Progress Notes (Signed)
Mobility Specialist Progress Note   01/08/22 1620  Mobility  Activity Transferred from chair to bed  Level of Assistance Minimal assist, patient does 75% or more  Assistive Device Four wheel walker  Distance Ambulated (ft) 6 ft  Activity Response Tolerated well  $Mobility charge 1 Mobility   Received pt in chair wanting to lay down before transport. No faults on transfer. Left w/ all needs met awaiting d/c.  Holland Falling Mobility Specialist [place] #:  [phone] Acute Rehab Office:  [phone]

## 2022-01-08 NOTE — Discharge Instructions (Signed)
It was a pleasure being a part of your care! You were brought into the hospital because of a COVID infection that led to some worsening of your COPD and Interstitial Lung disease. Please continue to take your inhalers on a daily basis, and take care!

## 2022-01-08 NOTE — Progress Notes (Signed)
[facility] transportation brought oxygen tank for transportation to [place]. Pt escorted off unit via wheelchair by [facility] transportation.

## 2022-01-08 NOTE — Progress Notes (Signed)
This LPN attempted to call report to [place] several times - unsuccessful, no answer. AVS given to [facility] transportation at bedside.

## 2022-01-08 NOTE — Discharge Summary (Cosign Needed)
Name: Ann Fletcher MRN: [id number] DOB: [birth year] 88 y.o. PCP: [facility]  Date of Admission: 12/28/2021  9:38 PM Date of Discharge: No discharge date for patient encounter. Attending Physician: Angelica Pou, MD  Discharge Diagnosis: 1. Principal Problem:   COPD with acute exacerbation ([place]) Active Problems:   COVID-19   Discharge Medications: Allergies as of 01/08/2022       Reactions   Tizanidine Hcl Other (See Comments)   Dizziness, patient reports fall   Nsaids Itching, Other (See Comments)   Stomach cramps   Pradaxa [dabigatran Etexilate Mesylate] Nausea And Vomiting   Sulfa Antibiotics Nausea And Vomiting   Other Other (See Comments)   Pickles Stomach cramps   Betadine [povidone Iodine] Itching, Rash   On skin        Medication List     TAKE these medications    albuterol 108 (90 Base) MCG/ACT inhaler Commonly known as: VENTOLIN HFA Inhale 2 puffs into the lungs 3 (three) times daily.   aspirin EC 81 MG tablet Take 81 mg by mouth daily. Swallow whole.   atorvastatin 40 MG tablet Commonly known as: LIPITOR Take 40 mg by mouth daily.   CLEAR EYES NATURAL TEARS OP Place 1 drop into both eyes as needed (for dry eyes).   furosemide 40 MG tablet Commonly known as: LASIX Take 1 tablet (40 mg total) by mouth daily. What changed:  how much to take additional instructions   glimepiride 2 MG tablet Commonly known as: AMARYL Take 2 mg by mouth every morning.   levothyroxine 112 MCG tablet Commonly known as: SYNTHROID Take 112 mcg by mouth daily before breakfast.   LORazepam 1 MG tablet Commonly known as: ATIVAN Take 1 mg by mouth at bedtime as needed for sleep.   OSCAL 500/200 D-3 PO Take 1 tablet by mouth daily.   pantoprazole 20 MG tablet Commonly known as: PROTONIX Take 1 tablet (20 mg total) by mouth daily for 14 days. What changed:  when to take this reasons to take this   [place] See admin instructions.   Prodigy Twist Top Lancets 28G Misc CHECK BLOOD GLUCOSE (SUGAR) TWICE DAILY   Tylenol 325 MG Caps Generic drug: Acetaminophen Take 1 tablet by mouth every 8 (eight) hours as needed (for pain).   umeclidinium-vilanterol 62.5-25 MCG/ACT Aepb Commonly known as: ANORO ELLIPTA Inhale 1 puff into the lungs daily. Start taking on: January 09, 2022   VITAMIN B 12 PO Take 1,000 mcg by mouth daily.   Vitamin D3 50 MCG (2000 UT) Tabs Take 2,000 Units by mouth daily.   vitamin E 180 MG (400 UNITS) capsule Generic drug: vitamin E Take 400 Units by mouth daily.        Disposition and follow-up:   Ann Fletcher was discharged from [facility] in Good condition.  At the hospital follow up visit please address:  1.  Please evaluate patient's need for oxygen, and make sure she is able to ambulate well. Also please take a look at her diabetes medications and what needs to be continued or Dced. Please assure medication compliance with inhaler. Please evaluate need for Lasix.   2.  Labs / imaging needed at time of follow-up: NA  3.  Pending labs/ test needing follow-up: NA  Follow-up Appointments: [facility] Patient  Hospital Course by problem list:  #COPD/ILD Exacerbation 2/2 COVID Infection  Patient initially presented to the emergency department with 2  days of upper respiratory symptoms as well as shortness of breath and wheezing.  She was afebrile at the time with a fever up to 101.2, and on physical exam she had diffuse wheezing throughout her lung fields.  She was given a dose of Solu-Medrol, magnesium, and ceftriaxone.  Prednisone was started for 5 days 40 mg, and Tessalon Perles were given for cough.  She was put on contact precautions due to her COVID infection.  She was given breathing treatments with DuoNebs, would also an oral Ellipta (LABA/LAMA/ICS), and symptoms started to improve.  Her breathing is gotten significantly better,  and oxygen requirements decreased significantly.  Patient is medically ready to be discharged to SNF, she is also a paced patient.   #Aspiration pneumonitis Pt had two instances of vomiting, and had a few hours of increased need for oxygen. Chest X-Ray revealed streaky bilateral basilar opacities concerning for atelectasis vs. Pneumonia, likely in the setting of two episodes of vomiting she had. We continued to monitor, and patient was not afebrile, leukocytosis did not develop, and she was no desaturating, so antibiotics were not necessary.   #History of atrial fibrillation status post AV nodal ablation Patient's heart rhythm was watched daily with telemetry, she is ventricularly paced.  #CKD stage IV Patient's creatinine baseline seems to be around 1.7-1.8.  Most recent creatinine was 1.83.  Her creatinine and GFR were watched daily with BMPs, and she was encouraged daily to drink as many fluids as she can.  #Type 2 diabetes mellitus Patient's A1c was 6.8 about a year ago.  We started her on sliding scale insulin, and monitor glucose.  Would recommend reviewing her diabetic medication on follow-up.  #HFpEF Patient was only given Lasix for HFpEF due to her CKD Stage 4, and was tolerating well, however was discontinued to do slight worsening of kidney function.    Discharge Subjective:   Pt was seen bedside in the AM, she denies any respiratory issues and states she is feeling better.   Discharge Exam:   BP (!) 135/56 (BP Location: Left Arm)   Pulse 68   Temp 97.7 F (36.5 C) (Oral)   Resp 17   Ht '5\' 3"'$  (1.6 m)   Wt 66.4 kg   SpO2 99%   BMI 25.93 kg/m  Discharge exam:   Constitutional: Well-appearing elderly woman laying in bed, in no acute distress  Cardiovascular: RRR, no murmurs, rubs, or gallops  Pulmonary/Chest: Normal work of breathing on room air, expiratory wheezes and inspiratory rales heard throughout lung fields  Abdominal: Soft, non-tender, non-distended  Skin:  Decreased skin turgor  Extremities: Thin skin on bilateral calcaneus  Pertinent Labs, Studies, and Procedures:     Latest Ref Rng & Units 01/07/2022    3:33 AM 01/06/2022    2:19 AM 01/05/2022    7:04 AM  CBC  WBC 4.0 - 10.5 K/uL 9.5  8.8  11.5   Hemoglobin 12.0 - 15.0 g/dL 7.2  7.3  7.8   Hematocrit 36.0 - 46.0 % 22.8  23.2  24.5   Platelets 150 - 400 K/uL 181  156  155        Latest Ref Rng & Units 01/08/2022    2:33 AM 01/07/2022    3:33 AM 01/06/2022    2:19 AM  BMP  Glucose 70 - 99 mg/dL 85  159  132   BUN 8 - 23 mg/dL 55  62  60   Creatinine 0.44 - 1.00 mg/dL 1.83  0.66  2.02   Sodium 135 - 145 mmol/L 134  133  135   Potassium 3.5 - 5.1 mmol/L 4.1  4.4  4.3   Chloride 98 - 111 mmol/L 96  96  96   CO2 22 - 32 mmol/L '28  29  30   '$ Calcium 8.9 - 10.3 mg/dL 8.5  8.1  8.4     DG Chest 2 View  Result Date: 12/28/2021 CLINICAL DATA:  Nausea EXAM: CHEST - 2 VIEW COMPARISON:  Radiograph 09/11/2021 FINDINGS: Stable cardiomegaly. Aortic atherosclerotic calcification. Left chest wall pacemaker no focal consolidation, pleural effusion, or pneumothorax. No acute osseous abnormality. Chronic posttraumatic deformity right shoulder. IMPRESSION: No active cardiopulmonary disease.  Cardiomegaly. Electronically Signed   By: Placido Sou M.D.   On: 12/28/2021 22:40     Discharge Instructions: Discharge Instructions     Call MD for:  persistant nausea and vomiting   Complete by: As directed    Call MD for:  severe uncontrolled pain   Complete by: As directed    Call MD for:  temperature >100.4   Complete by: As directed    Diet - low sodium heart healthy   Complete by: As directed    Increase activity slowly   Complete by: As directed        Signed: Drucie Opitz, MD 01/08/2022, 12:01 PM   Pager: [phone]

## 2022-01-17 ENCOUNTER — Ambulatory Visit: Payer: Medicare Other | Admitting: Gastroenterology

## 2022-02-05 ENCOUNTER — Ambulatory Visit: Payer: Medicare Other | Admitting: Podiatry

## 2022-02-20 ENCOUNTER — Encounter: Payer: Self-pay | Admitting: Hematology & Oncology

## 2022-03-07 ENCOUNTER — Ambulatory Visit: Payer: Medicare Other | Admitting: Cardiovascular Disease

## 2022-03-21 ENCOUNTER — Encounter: Payer: Self-pay | Admitting: Hematology & Oncology

## 2022-03-22 ENCOUNTER — Encounter: Payer: Self-pay | Admitting: Hematology & Oncology

## 2022-03-28 ENCOUNTER — Encounter: Payer: Self-pay | Admitting: Hematology & Oncology

## 2022-05-03 DEATH — deceased

## 2022-05-22 ENCOUNTER — Encounter: Payer: Self-pay | Admitting: Hematology & Oncology

## 2022-05-24 ENCOUNTER — Encounter: Payer: Self-pay | Admitting: Hematology & Oncology

## 2022-05-31 ENCOUNTER — Encounter: Payer: Self-pay | Admitting: Hematology & Oncology

## 2022-06-06 ENCOUNTER — Encounter: Payer: Self-pay | Admitting: Hematology & Oncology

## 2022-08-21 ENCOUNTER — Encounter: Payer: Self-pay | Admitting: Hematology & Oncology

## 2023-11-07 ENCOUNTER — Encounter: Payer: Self-pay | Admitting: Hematology & Oncology
